# Patient Record
Sex: Male | Born: 1935 | Race: White | Hispanic: No | Marital: Married | Smoking: Former smoker
Health system: Southern US, Community
[De-identification: ages and names within clinical notes are randomized; demographics above are authoritative.]

## PROBLEM LIST (undated history)

## (undated) DIAGNOSIS — Z8612 Personal history of poliomyelitis: Secondary | ICD-10-CM

## (undated) DIAGNOSIS — I4891 Unspecified atrial fibrillation: Secondary | ICD-10-CM

## (undated) DIAGNOSIS — I5032 Chronic diastolic (congestive) heart failure: Secondary | ICD-10-CM

## (undated) DIAGNOSIS — I10 Essential (primary) hypertension: Secondary | ICD-10-CM

## (undated) DIAGNOSIS — I639 Cerebral infarction, unspecified: Secondary | ICD-10-CM

## (undated) DIAGNOSIS — Z8679 Personal history of other diseases of the circulatory system: Secondary | ICD-10-CM

## (undated) DIAGNOSIS — I219 Acute myocardial infarction, unspecified: Secondary | ICD-10-CM

## (undated) DIAGNOSIS — K222 Esophageal obstruction: Secondary | ICD-10-CM

## (undated) DIAGNOSIS — K219 Gastro-esophageal reflux disease without esophagitis: Secondary | ICD-10-CM

## (undated) DIAGNOSIS — G709 Myoneural disorder, unspecified: Secondary | ICD-10-CM

## (undated) DIAGNOSIS — I2510 Atherosclerotic heart disease of native coronary artery without angina pectoris: Secondary | ICD-10-CM

## (undated) DIAGNOSIS — H269 Unspecified cataract: Secondary | ICD-10-CM

## (undated) DIAGNOSIS — R931 Abnormal findings on diagnostic imaging of heart and coronary circulation: Secondary | ICD-10-CM

## (undated) DIAGNOSIS — J449 Chronic obstructive pulmonary disease, unspecified: Secondary | ICD-10-CM

## (undated) DIAGNOSIS — E785 Hyperlipidemia, unspecified: Secondary | ICD-10-CM

## (undated) DIAGNOSIS — K648 Other hemorrhoids: Secondary | ICD-10-CM

## (undated) DIAGNOSIS — M419 Scoliosis, unspecified: Secondary | ICD-10-CM

## (undated) DIAGNOSIS — I499 Cardiac arrhythmia, unspecified: Secondary | ICD-10-CM

## (undated) DIAGNOSIS — G4733 Obstructive sleep apnea (adult) (pediatric): Secondary | ICD-10-CM

## (undated) DIAGNOSIS — D126 Benign neoplasm of colon, unspecified: Secondary | ICD-10-CM

## (undated) DIAGNOSIS — T884XXA Failed or difficult intubation, initial encounter: Secondary | ICD-10-CM

## (undated) DIAGNOSIS — A809 Acute poliomyelitis, unspecified: Secondary | ICD-10-CM

## (undated) DIAGNOSIS — M62838 Other muscle spasm: Secondary | ICD-10-CM

## (undated) DIAGNOSIS — I493 Ventricular premature depolarization: Secondary | ICD-10-CM

## (undated) DIAGNOSIS — I4819 Other persistent atrial fibrillation: Secondary | ICD-10-CM

## (undated) DIAGNOSIS — T4145XA Adverse effect of unspecified anesthetic, initial encounter: Secondary | ICD-10-CM

## (undated) HISTORY — DX: Personal history of poliomyelitis: Z86.12

## (undated) HISTORY — DX: Acute myocardial infarction, unspecified: I21.9

## (undated) HISTORY — DX: Gastro-esophageal reflux disease without esophagitis: K21.9

## (undated) HISTORY — DX: Hyperlipidemia, unspecified: E78.5

## (undated) HISTORY — DX: Other persistent atrial fibrillation: I48.19

## (undated) HISTORY — DX: Ventricular premature depolarization: I49.3

## (undated) HISTORY — DX: Chronic obstructive pulmonary disease, unspecified: J44.9

## (undated) HISTORY — DX: Acute poliomyelitis, unspecified: A80.9

## (undated) HISTORY — PX: UPPER GASTROINTESTINAL ENDOSCOPY: SHX188

## (undated) HISTORY — DX: Atherosclerotic heart disease of native coronary artery without angina pectoris: I25.10

## (undated) HISTORY — DX: Personal history of other diseases of the circulatory system: Z86.79

## (undated) HISTORY — PX: TONSILLECTOMY: SUR1361

## (undated) HISTORY — DX: Cerebral infarction, unspecified: I63.9

## (undated) HISTORY — DX: Benign neoplasm of colon, unspecified: D12.6

## (undated) HISTORY — DX: Obstructive sleep apnea (adult) (pediatric): G47.33

## (undated) HISTORY — DX: Unspecified cataract: H26.9

## (undated) HISTORY — DX: Myoneural disorder, unspecified: G70.9

## (undated) HISTORY — PX: ABLATION: SHX5711

## (undated) HISTORY — PX: ESOPHAGEAL DILATION: SHX303

## (undated) HISTORY — DX: Abnormal findings on diagnostic imaging of heart and coronary circulation: R93.1

## (undated) HISTORY — PX: POLYPECTOMY: SHX149

## (undated) HISTORY — DX: Essential (primary) hypertension: I10

## (undated) HISTORY — DX: Other hemorrhoids: K64.8

## (undated) HISTORY — DX: Scoliosis, unspecified: M41.9

## (undated) HISTORY — PX: CARDIAC CATHETERIZATION: SHX172

## (undated) HISTORY — PX: APPENDECTOMY: SHX54

## (undated) HISTORY — DX: Unspecified atrial fibrillation: I48.91

## (undated) HISTORY — PX: EYE SURGERY: SHX253

## (undated) HISTORY — DX: Esophageal obstruction: K22.2

---
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
## 1994-12-27 DIAGNOSIS — I639 Cerebral infarction, unspecified: Secondary | ICD-10-CM

## 1994-12-27 HISTORY — PX: CORONARY STENT PLACEMENT: SHX1402

## 1994-12-27 HISTORY — DX: Cerebral infarction, unspecified: I63.9

## 1994-12-27 HISTORY — PX: CORONARY ARTERY BYPASS GRAFT: SHX141

## 1998-06-11 ENCOUNTER — Ambulatory Visit: Admission: RE | Admit: 1998-06-11 | Discharge: 1998-06-11 | Payer: Self-pay | Admitting: Cardiology

## 1999-11-04 ENCOUNTER — Ambulatory Visit (HOSPITAL_COMMUNITY): Admission: RE | Admit: 1999-11-04 | Discharge: 1999-11-04 | Payer: Self-pay | Admitting: Cardiology

## 1999-11-05 ENCOUNTER — Encounter: Payer: Self-pay | Admitting: Cardiology

## 2002-10-22 ENCOUNTER — Ambulatory Visit (HOSPITAL_COMMUNITY): Admission: RE | Admit: 2002-10-22 | Discharge: 2002-10-22 | Payer: Self-pay | Admitting: Cardiology

## 2002-10-22 ENCOUNTER — Encounter: Payer: Self-pay | Admitting: Cardiology

## 2002-10-23 ENCOUNTER — Encounter: Payer: Self-pay | Admitting: Cardiology

## 2002-12-27 DIAGNOSIS — D126 Benign neoplasm of colon, unspecified: Secondary | ICD-10-CM

## 2002-12-27 HISTORY — DX: Benign neoplasm of colon, unspecified: D12.6

## 2004-01-06 ENCOUNTER — Ambulatory Visit (HOSPITAL_COMMUNITY): Admission: RE | Admit: 2004-01-06 | Discharge: 2004-01-06 | Payer: Self-pay | Admitting: Cardiology

## 2005-10-18 ENCOUNTER — Ambulatory Visit (HOSPITAL_COMMUNITY): Admission: RE | Admit: 2005-10-18 | Discharge: 2005-10-18 | Payer: Self-pay | Admitting: Cardiology

## 2006-02-28 ENCOUNTER — Ambulatory Visit: Payer: Self-pay | Admitting: Gastroenterology

## 2006-03-21 ENCOUNTER — Encounter (INDEPENDENT_AMBULATORY_CARE_PROVIDER_SITE_OTHER): Payer: Self-pay | Admitting: *Deleted

## 2006-03-21 ENCOUNTER — Ambulatory Visit: Payer: Self-pay | Admitting: Gastroenterology

## 2006-11-21 ENCOUNTER — Ambulatory Visit (HOSPITAL_COMMUNITY): Admission: RE | Admit: 2006-11-21 | Discharge: 2006-11-21 | Payer: Self-pay | Admitting: Cardiology

## 2006-12-27 HISTORY — PX: COLONOSCOPY: SHX174

## 2008-03-01 ENCOUNTER — Inpatient Hospital Stay (HOSPITAL_BASED_OUTPATIENT_CLINIC_OR_DEPARTMENT_OTHER): Admission: RE | Admit: 2008-03-01 | Discharge: 2008-03-01 | Payer: Self-pay | Admitting: Cardiology

## 2008-03-28 ENCOUNTER — Inpatient Hospital Stay (HOSPITAL_COMMUNITY): Admission: RE | Admit: 2008-03-28 | Discharge: 2008-03-29 | Payer: Self-pay | Admitting: Cardiology

## 2008-04-11 ENCOUNTER — Encounter (HOSPITAL_COMMUNITY): Admission: RE | Admit: 2008-04-11 | Discharge: 2008-07-10 | Payer: Self-pay | Admitting: Cardiology

## 2008-07-11 ENCOUNTER — Encounter (HOSPITAL_COMMUNITY): Admission: RE | Admit: 2008-07-11 | Discharge: 2008-08-09 | Payer: Self-pay | Admitting: Cardiology

## 2008-10-31 ENCOUNTER — Ambulatory Visit (HOSPITAL_COMMUNITY): Admission: RE | Admit: 2008-10-31 | Discharge: 2008-10-31 | Payer: Self-pay | Admitting: Cardiology

## 2010-02-24 DIAGNOSIS — R931 Abnormal findings on diagnostic imaging of heart and coronary circulation: Secondary | ICD-10-CM

## 2010-02-24 HISTORY — DX: Abnormal findings on diagnostic imaging of heart and coronary circulation: R93.1

## 2010-08-25 ENCOUNTER — Ambulatory Visit: Payer: Self-pay | Admitting: Cardiology

## 2011-02-26 ENCOUNTER — Ambulatory Visit (INDEPENDENT_AMBULATORY_CARE_PROVIDER_SITE_OTHER): Payer: Medicare Other | Admitting: Cardiology

## 2011-02-26 DIAGNOSIS — I2589 Other forms of chronic ischemic heart disease: Secondary | ICD-10-CM

## 2011-02-26 DIAGNOSIS — I6789 Other cerebrovascular disease: Secondary | ICD-10-CM

## 2011-03-10 ENCOUNTER — Encounter: Payer: Self-pay | Admitting: Gastroenterology

## 2011-03-16 NOTE — Letter (Signed)
Summary: Colonoscopy Letter  [facility]  [address] [place], [place] [address]   Phone: [phone]  Fax: [phone]      March 10, 2011 MRN: [id number]   [name] [address] [place], [place]  [address]   Dear Mr. Schriefer,   According to your medical record, it is time for you to schedule a Colonoscopy. The American Cancer Society recommends this procedure as a method to detect early colon cancer. Patients with a family history of colon cancer, or a personal history of colon polyps or inflammatory bowel disease are at increased risk.  This letter has been generated based on the recommendations made at the time of your procedure. If you feel that in your particular situation this may no longer apply, please contact our office.  Please call our office at [phone] to schedule this appointment or to update your records at your earliest convenience.  Thank you for cooperating with [place] to provide you with the very best care possible.   Sincerely,  Judie Petit T. Russella Dar, M.D.  [facility] [phone]

## 2011-05-11 NOTE — Cardiovascular Report (Signed)
NAMERAGHAV, VERRILLI                 ACCOUNT NO.:  [id number]   MEDICAL RECORD NO.:  [id number]          PATIENT TYPE:  OIB   LOCATION:  2899                         FACILITY:  [facility]   PHYSICIAN:  Colleen Can. Deborah Chalk, M.D.DATE OF BIRTH:  12/06/1936   DATE OF PROCEDURE:  10/31/2008  DATE OF DISCHARGE:  10/31/2008                            CARDIAC CATHETERIZATION   PROCEDURE:  Left heart catheterization with selective coronary  angiography, left ventricular angiography with Angio-Seal with  angiography of saphenous vein graft.   TYPE AND SITE OF ENTRY:  Percutaneous right femoral artery.   CATHETERS:  6-French 4 curved Judkins right and left coronary catheters  and 6-French pigtail ventriculographic catheter.   CONTRAST MATERIAL:  Omnipaque.   MEDICATIONS GIVEN PRIOR TO PROCEDURE:  Valium 10 mg p.o.   MEDICATIONS GIVEN DURING PROCEDURE:  Versed 2 mg IV.   COMMENT:  The patient tolerated the procedure well.   HEMODYNAMIC DATA:  The aortic pressure was 112/50, LV was 117/9-70.  There is no aortic valve gradient noted on pullback.   ANGIOGRAPHIC DATA:  1. The saphenous vein graft to distal right coronary artery was widely      patent.  There are minor irregularities in the saphenous vein      graft, but overall it was relatively smooth and had excellent      distal flow.  2. Right coronary artery was totally occluded.  It had diffuse disease      and segmental severe stenosis.  3. Left main coronary artery is normal.  4. Left circumflex.  The left circumflex had a patent stent.  There      was somewhat scattered diffuse disease throughout the course of the      obtuse marginal, but there was no severe focal stenosis and no      greater than 50% narrowing.  5. Left anterior descending was a moderate-sized vessel that extended      to the apex and slightly to the inferior wall.  There was somewhat      diffuse and severe disease in the proximal one-half with segmental      disease  of 60% nature with 2 areas that were in the 60-80% range.      Septal perforating branches in a diagonal vessel were present at      this location.  The vessel itself was proximally to 2.5 mm in      diameter.  6. Left ventricular angiogram.  The left ventricular angiogram was      performed in the RAO position.  Overall cardiac size and silhouette      were normal.  The global ejection fraction was 60%.  There was mild      inferior basilar hypokinesis.   OVERALL IMPRESSION:  Three-vessel coronary disease with totally occluded  right coronary artery with patent saphenous vein graft, patent stent in  the left circumflex coronary artery, and moderately severe to severe  diffuse disease in the proximal left anterior descending with well-  preserved global left ventricular function.   DISCUSSION:  It is felt that Mr. Swanner  is probably best managed  medically at this point in time.  This is long segmental disease in the  left anterior descending in a relatively small vessel.  While we have  had successful in the left circumflex, it clearly was much more severe  than this disease in the left anterior descending.  We will manage him  medically at this point in time.      Colleen Can. Deborah Chalk, M.D.  Electronically Signed     SNT/MEDQ  D:  10/31/2008  T:  11/01/2008  Job:  [id number]

## 2011-05-11 NOTE — Discharge Summary (Signed)
[name], [name]                 ACCOUNT NO.:  [id number]   MEDICAL RECORD NO.:  [id number]          PATIENT TYPE:  INP   LOCATION:  6524                         FACILITY:  [facility]   PHYSICIAN:  Colleen Can. Deborah Chalk, M.D.DATE OF BIRTH:  1936/05/08   DATE OF ADMISSION:  03/28/2008  DATE OF DISCHARGE:  03/29/2008                               DISCHARGE SUMMARY   DISCHARGE DIAGNOSES:  1. Known ischemic heart disease with stent placement to the left      circumflex with a 2.5 mm x 12 mm PROMUS stent proximally and a 2.5      mm x 15 mm PROMUS stent distally.  2. Longstanding ischemic heart disease with first heart attack in      1988, subsequent angioplasty attempts in 1996 with restenosis.  He      underwent coronary artery bypass grafting per Dr. Andrey Campanile in      December 1996, with his most recent catheterization in March 2009,      demonstrating severe 3-vessel disease; totally occluded right      coronary artery; 90% proximal left circumflex, 60% to 80% proximal      left anterior descending with a totally occluded left internal      mammary graft; totally occluded saphenous vein graft to the obtuse      margin; and a patent saphenous vein graft to the right coronary.  3. Previous stroke following cardiac catheterization in 1996.  4. Remote history of polio.  5. Hyperlipidemia.  6. Gastroesophageal reflux disease.   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  He underwent cardiac catheterization on March 6, with those  results as noted above.  Clinically, he had had some complaints of  fatigue as well as shortness of breath, but no chest pain.   Please see the history and physical for further patient presentation and  profile.   LABORATORY DATA:  PT and PTT were unremarkable.  His chemistries were  normal.  His CBC was normal.   HOSPITAL COURSE:  The patient was  admitted electively on March 28, 2008,  in order to undergo stent placement to left circumflex.  The procedure  was tolerated well without any known complications.  The patient  received 600 mg of Plavix as well as Angiomax.  The overall procedure  was tolerated well.  Postprocedure, he was transferred to 6500.  He did  have a small bump in his cardiac enzymes that evening afterwards.  His  CK total was 249, MB of 13.1, and troponin was 0.18.   The following morning, he was doing well on a.m. rounds and was felt to  be a satisfactory candidate for discharge.  His other laboratory data  was satisfactory as well, except for the CK and troponin.  Overall, he  was felt to be a stable candidate for discharge.   DISCHARGE CONDITION:  Stable.   DISCHARGE DIET:  Low-salt, heart healthy.   DISCHARGE INSTRUCTIONS:  His activity is to be increased as tolerated.  He is to engage in no heavy lifting for the next 1 week.  He is to use  an ice pack if needed to the groin.   DISCHARGE MEDICATIONS:  1. Vytorin 10/40 daily.  2. Baby aspirin daily.  3. Singulair 10 mg a day.  4. Prilosec 20 mg a day.  5. Ambien 10 mg at bedtime as needed.  6. Vitamin D 50,000 units once a week.  7. Topical testosterone daily.  8. Toprol-XL 50 mg a day.  9. He may resume his Z-PAK, Mag-Ox, folic acid, Coenzyme Q10, and      calcium as he was taking before.  10.Plavix 75 mg a day is added as well as a prescription for      nitroglycerin p.r.n.   PLAN:  I will plan on seeing him back in the office in 7-10 days,  certainly sooner if any problems arise in the interim.      Sharlee Blew, N.P.      Colleen Can. Deborah Chalk, M.D.  Electronically Signed    LC/MEDQ  D:  04/01/2008  T:  04/01/2008  Job:  [id number]

## 2011-05-11 NOTE — H&P (Signed)
[name], [name]                 ACCOUNT NO.:  [id number]   MEDICAL RECORD NO.:  [id number]           PATIENT TYPE:   LOCATION:                                 FACILITY:   PHYSICIAN:  Colleen Can. Deborah Chalk, M.D.DATE OF BIRTH:  Nov 18, 1936   DATE OF ADMISSION:  03/28/2008  DATE OF DISCHARGE:                              HISTORY & PHYSICAL   CHIEF COMPLAINT:  None.   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  He underwent cardiac catheterization per Dr. Roger Shelter on March 01, 2008.  At that time, he demonstrated severe three-  vessel coronary disease with a totally occluded right coronary, 90%  proximal left circumflex, 60-80% proximal LAD with a totally occluded  left internal mammary graft, totally occluded saphenous vein graft to  the obtuse marginal, and a patent saphenous vein graft to the right  coronary artery distally.  His cardiac catheterization films have been  reviewed by numerous physicians.  It is felt that stent placement to the  left circumflex could be satisfactory.  Clinically, he has really had no  specific chest pain.  He has had more in the way of shortness of breath  if he climbs steps and has had some extra fatigability.   PAST MEDICAL HISTORY.:  1. Longstanding ischemic heart disease with a first heart attack in      1988.  Subsequent angioplasty attempt in 1996 with subsequent      restenosis.  He underwent coronary artery bypass grafting per Dr.      Andrey Campanile in December of 1996 with LIMA to the LAD, vein graft to the      right coronary, vein graft to the obtuse marginal.  His most recent      catheterization was in March of 2009 with those results as noted      above.  2. 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  3. Polio  in the right upper extremity at age 30.  4. Mild scoliosis.  5. History of Raynaud's.  6. History of mild atrophy of the right chest.  7. Hyperlipidemia.  8. GERD.  9. History of herpes zoster.  10.Appendectomy at age 53.  11.Remote history of spinal tap.  12.Previous colonoscopy with polypectomy in 2008.   ALLERGIES:  MORPHINE WHICH CAUSES ITCHING.   CURRENT MEDICATIONS:  1. Vytorin 10/40 daily.  2. Baby aspirin daily.  3. Singulair 10 mg a day.  4. Prilosec 20 mg a day.  5. Ambien 10 mg at bedtime p.r.n.  6. Vitamin D 50,000 international units once a week.  7. Topical testosterone daily.  8. Z-Bec daily.  9. Magnesium oxide daily.  10.Folic acid daily.  11.Coenzyme Q10 daily.  12.Calcium daily.  13.Toprol XL 50 mg a day.   FAMILY HISTORY:  Father died at 79 with a heart attack.  Mother died  age  80 with a heart attack following bypass surgery.  He has one brother who  is 56 who has had recent heart transplantation in the setting of known  significant ischemic heart disease.   SOCIAL HISTORY:  He is a Teacher, early years/pre at [employer].  He has two  children.  He lives at home with his wife.  He does not smoke or use  alcohol products.   REVIEW OF SYSTEMS:  Review of systems is as noted above and is otherwise  unremarkable.   PHYSICAL EXAMINATION:  GENERAL:  He is in no acute distress.  VITAL SIGNS:  His weight is 143 pounds.  Blood pressure is 124/66  sitting, 126/70 standing.  Heart rate is 64, respirations 18.  He is  afebrile.  SKIN:  Skin is warm and dry.  Color is unremarkable.  LUNGS:  Clear.  HEART:  Shows a regular rhythm.  ABDOMEN:  Soft.  EXTREMITIES:  Without edema.  NEUROLOGIC:  Shows no gross focal deficits.   LABORATORY DATA:  PT and PTT are unremarkable.  His chemistries are  normal.  CBC is normal.   OVERALL IMPRESSION:  1. Severe ischemic heart disease with need for percutaneous coronary      intervention to the left circumflex.  2. Remote  myocardial infarction with remote bypass surgery.  3. Hyperlipidemia.  4. Previous stroke following cardiac catheterization.   PLAN:  Will proceed on with attempts at percutaneous coronary  intervention.  The procedure, risks, and benefits have all been reviewed  with the patient extensively and he is willing to proceed on Thursday,  March 28, 2008.      Sharlee Blew, N.P.      Colleen Can. Deborah Chalk, M.D.  Electronically Signed   LC/MEDQ  D:  03/26/2008  T:  03/26/2008  Job:  [id number]   cc:   Colleen Can. Deborah Chalk, M.D.

## 2011-05-11 NOTE — Cardiovascular Report (Signed)
NAMELERON, STOFFERS                 ACCOUNT NO.:  [id number]   MEDICAL RECORD NO.:  [id number]          PATIENT TYPE:  INP   LOCATION:  6524                         FACILITY:  [facility]   PHYSICIAN:  Colleen Can. Deborah Chalk, M.D.DATE OF BIRTH:  Aug 30, 1936   DATE OF PROCEDURE:  03/28/2008  DATE OF DISCHARGE:                            CARDIAC CATHETERIZATION   PROCEDURE:  Percutaneous coronary intervention with stent placement in  the left circumflex including a 2.5 x 12-mm PROMUS stent proximally and  a 2.5 x 15-mm stent distal to the original stent but contiguous.   Type and site of entry was percutaneous right femoral artery with the  above catheters, 6-French JL-4 guide, Hi-Torque floppy guidewire,  predilatation with a 2.0 x 9-mm Maverick, and then a 2.5 x 12-mm PROMUS  stent proximally and 2.5 mm PROMUS stent distally.   MEDICATIONS GIVEN PRIOR TO PROCEDURE:  Valium 10 mg p.o.   MEDICATIONS GIVEN DURING THE PROCEDURE:  Angiomax, Plavix 600 mg p.o.,  Versed 2 mg IV, intracoronary nitroglycerin a total of 300 mcg.   COMMENT:  The patient tolerated the procedure.   PROCEDURE:  The JL-4 guide provided adequate backup.  The Hi-Torque  floppy guidewire passed into the distal left circumflex without  difficulty.  We predilated with a 9-mm stent in what was felt to be the  proximal most severe portion of stenosis.  A 2.5 x 12-mm PROMUS stent  was placed proximally in an adequate position.  There was what was felt  to be somewhat of a distal bubble of normal tissue that was  approximately 3-4 mm in length before that in the more distal segmental  narrowing.  The stent was placed in satisfactory position, but the  distal narrowing appeared to be unsatisfactory and did not really give  satisfactory outflow to the stent.  We then returned with the 2.0 x 9-mm  Maverick balloon and predilatation of the distal stenosis.  We then came  back and  placed a 2.5 x 15-mm PROMUS contiguous with the  previous stent  and extended distally for another 12-15 mm.  It ended just proximal to  the ostium of the largest of the obtuse marginal branches.  The balloon  was dilated to 8 atmospheres to deliver the stent.  We then pulled the  balloon back a few millimeters and post dilatation of the junction of  the two stents at 11 atmospheres for 15 seconds.  The final angiographic  result was felt to be excellent.  We had excellent distal flow.  Proximally the stent was slightly oversized but satisfactory.  Distally  the stent was prominent but satisfactory.  Nitroglycerin was given after  the procedure and there was excellent flow throughout the left  circumflex.   OVERALL IMPRESSION:  Successful stent placement in the proximal segment  of the left circumflex coronary.      Colleen Can. Deborah Chalk, M.D.  Electronically Signed     SNT/MEDQ  D:  03/28/2008  T:  03/28/2008  Job:  [id number]

## 2011-05-11 NOTE — H&P (Signed)
[name], [name]                 ACCOUNT NO.:  [id number]   MEDICAL RECORD NO.:  [id number]            PATIENT TYPE:   LOCATION:                                 FACILITY:   PHYSICIAN:  Colleen Can. Deborah Chalk, M.D.    DATE OF BIRTH:   DATE OF ADMISSION:  03/01/2008  DATE OF DISCHARGE:                              HISTORY & PHYSICAL   CHIEF COMPLAINT:  None.   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  He has  known ischemic heart disease that dates back to 40 when he had his  first heart attack.  He subsequently had bypass surgery in 1996.  He did  have an angioplasty attempt prior to his surgery, but had restenosis  which subsequently resulted in open heart surgery.  He was felt to have  had a CVA after cardiac catheterization in 1996 as well.   He presents to our office on January 13.  His previous cardiologist had  retired.  He has basically been asymptomatic, and has had very minimal  symptoms.  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  He has had some extra fatigability.  He has really had no  chest pain.  His discomfort with his heart attack was more of a nausea  and sense of lack of well-being and vague arm and neck and shoulder  discomfort.  He was referred for 2-D echocardiogram as well as stress  study.  His 2-D echocardiogram showed EF of 55-60% with mild aortic  valve sclerosis and mild mitral regurgitation.  Stress testing was  performed on January 20.  He exercised for a total of 6-1/2 minutes.  The test was stopped prematurely due to tachycardia.  He was  hypertensive as well.  Clinically he had very mild chest discomfort and  shortness of breath.  His EKG was positive for ischemia.  He had  frequent PAC's and a run of SVT noted.  His perfusion images were  normal.  His LV function was  normal.  He was seen back in follow-up and  in light of the 2-3 mm of ST segment depression and the onset of chest  discomfort with exercise he has felt that it is in his best interest to  proceed on with cardiac catheterization to redefine his coronary  anatomy.  He now presents for this procedure.   PAST MEDICAL HISTORY:  1. Longstanding ischemic heart disease with first heart attack in      1988, subsequent angioplasty attempt in 1996 with subsequent      restenosis.  He underwent coronary artery bypass grafting per Dr.      Particia Lather in December 1996 with LIMA to the LAD, vein graft to      the right coronary, and vein graft to the obtuse marginal.  2. 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  3. Polio in the right upper extremity at age 30.  4. Mild scoliosis.  5. History of Raynaud's.  6. History of mild atrophy of the right chest.   ALLERGIES:  MORPHINE which causes itching.   CURRENT MEDICATIONS:  1. Vytorin 10/40 daily.  2. Baby aspirin daily.  3. Singulair 10 mg a day.  4. Prilosec 20 mg a day.  5. Ambien 10 mg at bedtime.  6. Vitamin D once a week 50,000 units.  7. Topical testosterone daily.  8. Z-Bec daily.  9. Mag-Ox daily.  10.Folic acid daily.  11.Coenzyme Q10 daily.  12.Calcium daily.  13.Toprol XL 50 mg a day.   FAMILY HISTORY:  Father died at 33 with heart attack.  Mother died at  age 57 of heart attack following bypass surgery.  He has one brother who  is 16 who has had recent heart transplant with significant ischemic  heart disease.   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  He lives at home with his wife.  He does not use tobacco or  alcohol.   OTHER SURGICAL HISTORY:  History of herpes zoster, appendectomy at age  71, remote history of spinal tap and previous colonoscopy with  polypectomy  in 2008.   REVIEW OF SYSTEMS:  Is as noted above.  He does have some cold feet.  He  has intermittent spasms of the extremities.  He has had chronic  difficulty sleeping and takes Ambien p.r.n.  Otherwise, Review of  Systems is negative.   PHYSICAL EXAMINATION:  GENERAL:  He is in no acute distress.  VITAL SIGNS:  Weight 136 pounds, blood pressure 122/68, heart rate 80  and regular, respirations 18, afebrile.  SKIN:  Warm and dry.  Color is  unremarkable.  NECK:  Supple without bruits.  LUNGS:  Clear.  HEART:  Shows a regular rhythm.  ABDOMEN:  Soft with no abdominal aortic aneurysm.  EXTREMITIES:  His lower extremity pulses are excellent.  There is no  evidence of edema.  NEUROLOGIC:  He is intact.  He does have some scattered fasciculations  noted on exam but no gross focal deficits.   LABORATORY DATA:  His laboratory data is pending.   OVERALL IMPRESSION:  1. Abnormal stress Cardiolite study.  2. Longstanding ischemic heart disease.  3. Hyperlipidemia.  4. Previous cerebrovascular accident following cardiac      catheterization.  5. History of polio.   PLAN:  Will proceed on with diagnostic cardiac catheterization.  The  risks, procedure, and benefits have been explained, and he is willing to  proceed on Friday, March 01, 2008.      Sharlee Blew, N.P.      Colleen Can. Deborah Chalk, M.D.  Electronically Signed    LC/MEDQ  D:  02/28/2008  T:  02/28/2008  Job:  [id number]

## 2011-05-11 NOTE — Cardiovascular Report (Signed)
NAMEELVAN, [name]                 ACCOUNT NO.:  [id number]   MEDICAL RECORD NO.:  [id number]          PATIENT TYPE:  OIB   LOCATION:  1963                         FACILITY:  [facility]   PHYSICIAN:  Colleen Can. Deborah Chalk, M.D.DATE OF BIRTH:  04-27-1936   DATE OF PROCEDURE:  DATE OF DISCHARGE:  03/01/2008                            CARDIAC CATHETERIZATION   PROCEDURE:  Left heart catheterization with selective coronary  angiography, left ventricular angiography, saphenous vein graft  angiography x2, and angiography of left internal mammary artery.   TYPE AND SITE OF ENTRY:  Percutaneous right femoral artery.   CATHETERS:  A 4-French four curved Judkins left coronary catheter, 4-  [place] 3DRC right coronary catheter, right bypass graft catheter, left  internal mammary graft catheter, a 4-French pigtail ventriculographic  catheter.   CONTRAST MATERIAL:  Omnipaque.   MEDICATIONS:  Given prior procedure:  Valium 10 mg.   MEDICATION GIVEN DURING THE PROCEDURE:  Versed 2 mg IV.   COMMENT:  The patient tolerated procedure well.   ANGIOGRAPHIC DATA:  1. Left main coronary artery is normal.  2. Left anterior descending:  Left anterior descending.  He has      segmental 60% to 80% narrowing in it's proximal one third.  There      does appear to be satisfactory flow.  Distal vessels that appears      to be satisfactory.  3. Circumflex:  Left circumflex has 90% proximal stenosis.  It is a      relatively small vessel of approximately 2-1/2 millimeters in      diameter.  It continues as a relatively large obtuse marginal.  It      would appear to be suitable for percutaneous intervention.   RIGHT CORONARY ARTERY:  The right coronary artery is totally occluded in  the midportion.   Saphenous vein graft to obtuse marginal is occluded.   Saphenous vein graft to the right coronary artery is widely patent.  There is a 30% narrowing in the body of the graft, it is somewhat focal.  The saphenous  vein graft inserts prior to the crux.  There is 30% to 40%  narrowing in the right coronary artery, distal to the crux.   The left internal mammary graft is atretic.   Left ventricular angiogram was performed in the RAO position.  Overall  cardiac size and silhouette are normal.  There is mild anterior basilar  hypokinesis.  The global ejection fraction is estimated to be 55%.   OVERALL IMPRESSION:  1. There is severe three-vessel coronary disease with totally occluded      right coronary artery, 90% proximal left circumflex and 60% to 80%      proximal left anterior descending with totally occluded left      internal mammary graft, totally occluded saphenous vein graft to      the obtuse marginal, and patent saphenous vein graft to the right      coronary artery distally.   DISCUSSION:  It is felt that we probably can do a stent placement on the  left circumflex artery.  It would  appear that the saphenous vein graft  to the distal right coronary artery is satisfactory at this time, with  only mild disease in the graft.  The left anterior descending is only a  2 to 2-1/2-mm vessel, and it is segmental length.  My initial plan is to  try to manage him medically with this stenosis, although it is clearly  is severe enough that it could warrant stent placement, but would be  over a segmental length of the artery.      Colleen Can. Deborah Chalk, M.D.  Electronically Signed     SNT/MEDQ  D:  03/01/2008  T:  03/02/2008  Job:  [id number]

## 2011-05-14 NOTE — Op Note (Signed)
NAMECHRISTOP, [name]                 ACCOUNT NO.:  [id number]   MEDICAL RECORD NO.:  [id number]          PATIENT TYPE:  OUT   LOCATION:  NUC                          FACILITY:  [facility]   PHYSICIAN:  Othelia Pulling, M.D.      DATE OF BIRTH:  1936-09-02   DATE OF PROCEDURE:  10/18/2005  DATE OF DISCHARGE:                                 OPERATIVE REPORT   OPERATION/PROCEDURE:  Stress EKG and perfusion study.   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  Prior to that he had  had an acute myocardial infarction in 1988.  Since his bypass surgery, he  has been free from any clinical symptoms of angina or arrhythmia.  He does  not use tobacco.  He had at one time a very transient TIA postoperatively  but no recurrence in any way since that day.   CURRENT MEDICATIONS:  1.  Lopressor 25 mg twice a day, none for two days.  2.  Nexium.  3.  Singulair.  4.  Aspirin.   PHYSICAL EXAMINATION:  VITAL SIGNS:  Weight 184, blood pressure 128/70,  pulse 79 and regular.  HEENT:  Negative.  NECK:  No neck bruit.  LUNGS:  Clear.  HEART:  Regular in rhythm.  No murmur noted.  ABDOMEN:  Soft.  EXTREMITIES:  Reveal good pulsations.   LABORATORY DATA:  A resting EKG within normal limits.  Minor early U-wave  changes noted.   DESCRIPTION OF PROCEDURE:  He underwent a standard Bruce protocol stress  test at which time his peak pulse was 140.  It was terminated at the end of  six minutes.  He had no chest pain or awareness of any discomfort.  His  blood pressure rose to 190/60. Five minutes after rest it was 150 and seven  minutes after rest it was 145 systolic.  At the time of the injection of  isotope, his peak pulse of 140 was associated with 2 mm ST depression  involving lead II and part of lead III.  Perfusion studies were acquired and  will be made a part of this report.   ASSESSMENT:  Positive ST changes with  stress.  Adequate achievement of  target pulse, somewhat slow recovery of blood pressure, occasional premature  ventricular contractions were noted.  A followup will be made and copy of  report to his primary care physician.   IMPRESSION:  1.  Good status post bypass surgery.  2.  Exercise-induced abnormal ST changes, associated with some increased      premature ventricular contractions which may or may not be of clinical      importance.   RECOMMENDATIONS:  Repeat study on an annual basis.           ______________________________  Othelia Pulling, M.D.     JB/MEDQ  D:  10/18/2005  T:  10/18/2005  Job:  [id number]   cc:   Geoffry Paradise, M.D.  Fax: [phone]

## 2011-05-14 NOTE — H&P (Signed)
NAMESAKAI, WOLFORD                 ACCOUNT NO.:  [id number]   MEDICAL RECORD NO.:  [id number]           PATIENT TYPE:   LOCATION:                                 FACILITY:   PHYSICIAN:  Colleen Can. Deborah Chalk, M.D.DATE OF BIRTH:  12/12/1936   DATE OF ADMISSION:  07/11/2008  DATE OF DISCHARGE:                              HISTORY & PHYSICAL   CHIEF COMPLAINT:  Chest pain and palpitations.   HISTORY OF PRESENT ILLNESS:  [name] is a 75 year old white male who  is referred for repeat cardiac catheterization.  He has had previous  stenting of the left circumflex approximately 6 months ago.  He  presented for his routine followup visit today on October 14, 2008.  At  that time, he noted that overall he was doing okay but certainly thought  that he could be doing better.  He has had intermittent chest  discomfort.  He has also had some mild chest discomfort as well as  palpitations and some degree of fatigue.  He has a known severe stenosis  in the left anterior descending and he now presents for repeat  catheterization with plans for possible intervention.   PAST MEDICAL HISTORY:  1. Longstanding ischemic heart disease with first heart attack in      1988, subsequent angioplasty attempt in 1996 with subsequent      restenosis.  He underwent coronary artery bypass grafting by Dr.      Particia Lather in December 1996 with left internal mammary to the      LAD, vein graft to the right coronary, and the vein graft to the      obtuse marginal.  His last catheterization was in March of this      year, which showed severe 3-vessel disease with a totally occluded      right coronary, 90% proximal left circumflex, 60-80% proximal LAD      with a totally occluded left internal mammary graft, totally      occluded saphenous vein graft to the obtuse marginal, and a patent      saphenous vein graft to the right coronary.  He has had stenting of      the left circumflex in April 2009.  2. 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  3. Polio of the right upper extremity at age 33.  4. Mild scoliosis.  5. History of Raynaud's.  6. History of mild atrophy of the right chest.  7. Hyperlipidemia.  8. GERD.  9. Palpitations.  10.History of herpes zoster.  11.Remote appendectomy at the age of 56.  12.Remote history of spinal tap.  13.Previous colonoscopy with polypectomy in 2008.   Allergies are MORPHINE, which causes itching.   CURRENT MEDICATIONS:  1. Vytorin 10/40 daily.  2. Aspirin 81 mg a day.  3. Singulair 10 mg a day.  4. Ambien 10 mg at bedtime.  5. Vitamin D 50,000 units once a month.  6. Testosterone topical daily.  7. Z-PAK daily.  8. Mag-Ox daily.  9. Folic acid daily.  10.CoenzymeQ 10 daily.  11.Calcium daily.  12.Toprol-XL 100 mg a day.  13.Zantac 300 mg a day.  14.Plavix 75 mg a day.  15.Lactinex daily.   Family history is unchanged.   Social history is unchanged.   Review of systems is as noted above and is otherwise unremarkable.   On exam, he is a pleasant white male.  He is in no acute distress.  His  weight is 134 pounds, blood pressure is 130/70 sitting, 126/76 standing,  heart rate 60, respirations 18, he is afebrile.  Skin is warm and dry.  Color is unremarkable.  Lungs are clear.  Heart shows a regular rhythm.  Abdomen is soft, positive bowel sounds, nontender.  Extremities are  without edema.  Neurologic shows no gross focal deficits.   Pertinent labs are pending.   OVERALL IMPRESSION:  1. Multitude of somatic complaints.  2. Known ischemic heart disease with severe stenosis in the left      anterior descending.  3. Extensive history of coronary artery disease with previous stenting      of left circumflex and remote bypass grafting.  4. Remote stroke.  5. Hyperlipidemia.   PLAN:  We will proceed on with attempts  at repeat cardiac  catheterization with the idea of intervening on the left anterior  descending.  Procedure risks and benefits have been reviewed with both  he and his wife and they are willing to proceed.      Sharlee Blew, N.P.      Colleen Can. Deborah Chalk, M.D.  Electronically Signed    LC/MEDQ  D:  10/14/2008  T:  10/15/2008  Job:  [id number]

## 2011-05-26 ENCOUNTER — Other Ambulatory Visit: Payer: Self-pay | Admitting: *Deleted

## 2011-05-26 MED ORDER — CLOPIDOGREL BISULFATE 75 MG PO TABS: 75.0000 mg | ORAL_TABLET | Freq: Every day | ORAL | Status: DC

## 2011-07-19 ENCOUNTER — Telehealth: Payer: Self-pay | Admitting: Cardiology

## 2011-07-19 ENCOUNTER — Other Ambulatory Visit: Payer: Self-pay | Admitting: *Deleted

## 2011-07-19 DIAGNOSIS — I2510 Atherosclerotic heart disease of native coronary artery without angina pectoris: Secondary | ICD-10-CM

## 2011-07-19 NOTE — Telephone Encounter (Signed)
Please place the orders for his blood work (Lp, Cmet) into [employer].

## 2011-08-19 ENCOUNTER — Encounter: Payer: Self-pay | Admitting: *Deleted

## 2011-08-19 DIAGNOSIS — Z8679 Personal history of other diseases of the circulatory system: Secondary | ICD-10-CM | POA: Insufficient documentation

## 2011-08-19 DIAGNOSIS — M419 Scoliosis, unspecified: Secondary | ICD-10-CM | POA: Insufficient documentation

## 2011-08-19 DIAGNOSIS — Z8612 Personal history of poliomyelitis: Secondary | ICD-10-CM | POA: Insufficient documentation

## 2011-08-19 DIAGNOSIS — I639 Cerebral infarction, unspecified: Secondary | ICD-10-CM | POA: Insufficient documentation

## 2011-08-19 DIAGNOSIS — K219 Gastro-esophageal reflux disease without esophagitis: Secondary | ICD-10-CM | POA: Insufficient documentation

## 2011-08-19 DIAGNOSIS — I493 Ventricular premature depolarization: Secondary | ICD-10-CM | POA: Insufficient documentation

## 2011-08-24 ENCOUNTER — Other Ambulatory Visit: Payer: Self-pay | Admitting: *Deleted

## 2011-08-25 ENCOUNTER — Other Ambulatory Visit: Payer: Self-pay | Admitting: *Deleted

## 2011-08-25 ENCOUNTER — Other Ambulatory Visit (INDEPENDENT_AMBULATORY_CARE_PROVIDER_SITE_OTHER): Payer: Medicare Other | Admitting: *Deleted

## 2011-08-25 DIAGNOSIS — I2510 Atherosclerotic heart disease of native coronary artery without angina pectoris: Secondary | ICD-10-CM

## 2011-08-25 LAB — HEPATIC FUNCTION PANEL
ALT: 32 U/L (ref 0–53)
AST: 32 U/L (ref 0–37)
Albumin: 4.2 g/dL (ref 3.5–5.2)
Alkaline Phosphatase: 61 U/L (ref 39–117)
Bilirubin, Direct: 0.2 mg/dL (ref 0.0–0.3)
Total Bilirubin: 1.2 mg/dL (ref 0.3–1.2)
Total Protein: 7.1 g/dL (ref 6.0–8.3)

## 2011-08-25 LAB — BASIC METABOLIC PANEL
BUN: 21 mg/dL (ref 6–23)
CO2: 28 mEq/L (ref 19–32)
Calcium: 9.5 mg/dL (ref 8.4–10.5)
Chloride: 103 mEq/L (ref 96–112)
Creatinine, Ser: 1.2 mg/dL (ref 0.4–1.5)
GFR: 63.65 mL/min (ref >60.00)
Glucose, Bld: 100 mg/dL — ABNORMAL HIGH (ref 70–99)
Potassium: 4.1 mEq/L (ref 3.5–5.1)
Sodium: 138 mEq/L (ref 135–145)

## 2011-08-25 LAB — LIPID PANEL
Cholesterol: 164 mg/dL (ref 0–200)
HDL: 52.6 mg/dL (ref >39.00)
LDL Cholesterol: 91 mg/dL (ref 0–99)
Total CHOL/HDL Ratio: 3
Triglycerides: 100 mg/dL (ref 0.0–149.0)
VLDL: 20 mg/dL (ref 0.0–40.0)

## 2011-08-25 MED ORDER — RANOLAZINE ER 500 MG PO TB12: 500.0000 mg | ORAL_TABLET | Freq: Two times a day (BID) | ORAL | Status: DC

## 2011-08-25 NOTE — Telephone Encounter (Signed)
escribe medication per fax request  

## 2011-08-26 ENCOUNTER — Other Ambulatory Visit: Payer: Self-pay | Admitting: *Deleted

## 2011-08-26 MED ORDER — NEBIVOLOL HCL 10 MG PO TABS: 10.0000 mg | ORAL_TABLET | Freq: Every day | ORAL | Status: DC

## 2011-08-26 NOTE — Telephone Encounter (Signed)
escribe medication per fax request  

## 2011-08-27 ENCOUNTER — Telehealth: Payer: Self-pay | Admitting: *Deleted

## 2011-08-27 NOTE — Telephone Encounter (Signed)
Returned call re Labs; notified of labs; will fax results to him at [phone]

## 2011-09-01 ENCOUNTER — Encounter: Payer: Self-pay | Admitting: Nurse Practitioner

## 2011-09-01 ENCOUNTER — Ambulatory Visit (INDEPENDENT_AMBULATORY_CARE_PROVIDER_SITE_OTHER): Payer: Medicare Other | Admitting: Nurse Practitioner

## 2011-09-01 VITALS — BP 110/78 | HR 70 | Ht 66.0 in | Wt 130.2 lb

## 2011-09-01 DIAGNOSIS — E785 Hyperlipidemia, unspecified: Secondary | ICD-10-CM

## 2011-09-01 DIAGNOSIS — I2510 Atherosclerotic heart disease of native coronary artery without angina pectoris: Secondary | ICD-10-CM | POA: Insufficient documentation

## 2011-09-01 MED ORDER — RANOLAZINE ER 1000 MG PO TB12: 500.0000 mg | ORAL_TABLET | Freq: Two times a day (BID) | ORAL | Status: DC

## 2011-09-01 NOTE — Patient Instructions (Signed)
Let's increase your Ranexa to 1000mg  twice a day I want you to see Dr. Marca Ancona in about 6 weeks for follow up. Call for any other problems.

## 2011-09-01 NOTE — Progress Notes (Signed)
[name] Date of Birth: Aug 13, 1936   History of Present Illness: [name] is seen today for his 6 month visit. He is seen for Dr. Shirlee Latch. He is a former patient of Dr. Ronnald Nian. He has a long history of CAD with his first MI in 1988, angioplasty with restenosis in 1996, CABG in 1996, and stents to LCX in 2009. He has been managed medically since that time. Last stress test was in 2011. He will have some occasional angina and DOE. He and his wife think it may be gradually progressing since his last visit. He has not had to use any NTG. He says his symptoms are resolved by the time he gets it out of his pocket.  He does feel like his Ranexa helps him. He is on the 500 mg dose. He has had recent labs and these are reviewed again today.   Current Outpatient Prescriptions on File Prior to Visit  Medication Sig Dispense Refill  . aspirin 81 MG tablet Take 81 mg by mouth daily.        . B Complex Vitamins (VITAMIN-B COMPLEX PO) Take by mouth daily.        . Calcium Citrate (CITRACAL PO) Take by mouth 2 (two) times daily.        . Cholecalciferol (VITAMIN D PO) Take by mouth. Taking 50,000 2x month       . clopidogrel (PLAVIX) 75 MG tablet Take 1 tablet (75 mg total) by mouth daily.  30 tablet  6  . lactobacillus acidophilus & bulgar (LACTINEX) chewable tablet Chew 1 tablet by mouth daily.        [name] LORazepam (ATIVAN) 0.5 MG tablet Take 0.5 mg by mouth as needed. Taking QHS      . magnesium oxide (MAG-OX) 400 MG tablet Take 400 mg by mouth daily.        . nebivolol (BYSTOLIC) 10 MG tablet Take 1 tablet (10 mg total) by mouth daily.  30 tablet  3  . ranitidine (ZANTAC) 300 MG tablet Take 300 mg by mouth at bedtime.        . rosuvastatin (CRESTOR) 40 MG tablet Take 40 mg by mouth daily.        . Testosterone Micronized POWD 100 mg by Does not apply route daily.        . traZODone (DESYREL) 50 MG tablet Take 50 mg by mouth at bedtime.        [name] DISCONTD: ranolazine (RANEXA) 500 MG 12 hr tablet Take  1 tablet (500 mg total) by mouth 2 (two) times daily.  60 tablet  5    Allergies  Allergen Reactions  . Morphine And Related     itching    Past Medical History  Diagnosis Date  . Coronary artery disease     First MI in 1988. PCI in 1996 with subsequent CABG in 1996 due to restenosis. S/P stents to LCX in 2009. Noted to have residual disease in the LAD in a diffuse manner and atretic LIMA graft. He is managed medically.   [name] History of Raynaud's syndrome   . 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 GERD (gastroesophageal reflux disease)   . Hyperlipidemia   . PVC (premature ventricular contraction)   . 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 Scoliosis     mild  . Polio   . Abnormal nuclear cardiac imaging test March 2011    Has  positive EKG response, no perfusion defect and normal EF    Past Surgical History  Procedure Date  . Coronary artery bypass graft 1996    with a LIMA to the LAD, SVG to RCA and SVG to OM  . Coronary stent placement 2009     LCX  . Appendectomy     Age 38  . Colonoscopy 2008    History  Smoking status  . Former Smoker  Smokeless tobacco  . Not on file    History  Alcohol Use No    Family History  Problem Relation Age of Onset  . Heart disease Mother   . Heart disease Father   . Other Brother     brother had heart transplant    Review of Systems: The review of systems is positive for occasional palpitations. He has known PVC's. He has had gradual worsening of his chest discomfort and DOE. No sl NTG use.  He tried to join a senior exercise walking class but could not keep up. No symptoms at rest. He has some chronic back and neck pains. All other systems were reviewed and are negative.  Physical Exam: BP 110/78  Pulse 70  Ht 5\' 6"  (1.676 m)  Wt 130 lb 3.2 oz (59.058 kg)  BMI 21.01 kg/m2 Patient is very pleasant and in no acute distress. He is quite thin. Skin is warm and dry. Color is normal.  HEENT is  unremarkable. Normocephalic/atraumatic. PERRL. Sclera are nonicteric. Neck is supple. No masses. No JVD. Lungs are clear. Cardiac exam shows a regular rate and rhythm. No ectopics noted today. Abdomen is soft. Extremities are without edema. Gait and ROM are intact. No gross neurologic deficits noted.  LABORATORY DATA:   Assessment / Plan:

## 2011-09-01 NOTE — Assessment & Plan Note (Signed)
He is tolerating his current regimen. Recheck in 6 months.

## 2011-09-01 NOTE — Assessment & Plan Note (Signed)
His history is extensive. He has been managed medically since the time of his last cath in 2009. His stress test was in 2011 and he did have a positive EKG response but no perfusion abnormality and a normal EF. He is not too enthused about having a repeat stress test at this time. He is agreeable to increasing his Ranexa to 1000 mg BID. He has NTG on hand but is not using.  I will have him follow up in 6 weeks. Dr. Shirlee Latch will be assuming his cardiology care. Patient is agreeable to this plan and will call if any problems develop in the interim. Patient is agreeable to this plan and will call if any problems develop in the interim.

## 2011-09-02 ENCOUNTER — Telehealth: Payer: Self-pay | Admitting: *Deleted

## 2011-09-02 NOTE — Telephone Encounter (Signed)
Labs reported at office visit

## 2011-09-02 NOTE — Telephone Encounter (Signed)
Message copied by Burnell Blanks on Thu Sep 02, 2011 10:22 AM ------      Message from: Rosalio Macadamia      Created: Wed Aug 25, 2011  1:37 PM       Ok to report. Labs are satisfactory.  Stay on same medicines. Recheck BMET/HPF/LIPIDS  in 6 months.

## 2011-09-06 NOTE — Progress Notes (Signed)
Agree with note.  Would get ECG to follow QT interval 1-2 weeks after ranolazine increase.  [name] [employer]

## 2011-09-21 LAB — CK TOTAL AND CKMB (NOT AT ARMC)
CK, MB: 13.1 — ABNORMAL HIGH
Relative Index: 5.3 — ABNORMAL HIGH
Total CK: 249 — ABNORMAL HIGH

## 2011-09-21 LAB — BASIC METABOLIC PANEL
BUN: 10
CO2: 29
Calcium: 8.9
Chloride: 105
Creatinine, Ser: 1.06
GFR calc Af Amer: >60
GFR calc non Af Amer: >60
Glucose, Bld: 91
Potassium: 4.5
Sodium: 138

## 2011-09-21 LAB — CBC
HCT: 43.4
Hemoglobin: 14.6
MCHC: 33.7
MCV: 93.9
Platelets: 187
RBC: 4.62
RDW: 13.2
WBC: 7.2

## 2011-09-21 LAB — TROPONIN I: Troponin I: 0.18 — ABNORMAL HIGH

## 2011-09-21 LAB — MAGNESIUM: Magnesium: 2.3

## 2011-10-07 ENCOUNTER — Ambulatory Visit (INDEPENDENT_AMBULATORY_CARE_PROVIDER_SITE_OTHER): Payer: Medicare Other | Admitting: Cardiology

## 2011-10-07 ENCOUNTER — Encounter: Payer: Self-pay | Admitting: Cardiology

## 2011-10-07 VITALS — BP 112/66 | HR 104 | Ht 66.0 in | Wt 132.0 lb

## 2011-10-07 DIAGNOSIS — E785 Hyperlipidemia, unspecified: Secondary | ICD-10-CM

## 2011-10-07 DIAGNOSIS — I25810 Atherosclerosis of coronary artery bypass graft(s) without angina pectoris: Secondary | ICD-10-CM

## 2011-10-07 DIAGNOSIS — I2510 Atherosclerotic heart disease of native coronary artery without angina pectoris: Secondary | ICD-10-CM

## 2011-10-07 MED ORDER — RANOLAZINE ER 1000 MG PO TB12: 1000.0000 mg | ORAL_TABLET | Freq: Two times a day (BID) | ORAL | Status: DC

## 2011-10-07 NOTE — Patient Instructions (Signed)
Your physician has requested that you have an echocardiogram. Echocardiography is a painless test that uses sound waves to create images of your heart. It provides your doctor with information about the size and shape of your heart and how well your heart's chambers and valves are working. This procedure takes approximately one hour. There are no restrictions for this procedure.  Your physician wants you to follow-up in: 4 months with Dr Shirlee Latch. (February 2013). You will receive a reminder letter in the mail two months in advance. If you don't receive a letter, please call our office to schedule the follow-up appointment.

## 2011-10-09 NOTE — Assessment & Plan Note (Signed)
Goal LDL < 70.  Last LDL was 91 on Crestor 40.  Patient has had a hard time getting his LDL down to goal.  Will continue current statin and encourage dietary changes.

## 2011-10-09 NOTE — Progress Notes (Signed)
PCP: Dr. Jacky Kindle  75 yo with history of CAD s/p CABG presents for cardiology followup.  He has been seen by Dr. Deborah Chalk in the past and is seen by me for the first time today.  His last cath was in 11/09.  The SVG-distal RCA was patent, the CFX system was patent.  His LIMA was atretic and there were serial 60% and 80% stenoses in the native LAD.  He was managed medically.    Patient saw [name] in 9/12 and described increased exertional dyspnea and chest pain.  His ranolazine was increased to 1000 mg bid.  He has noted symptomatic improvement with that change.  He is now able to walk on flat ground without dyspnea.  He can walk for up to 2 miles with no problem.  He does get short of breath when carrying a load more than about 50 yards.  He can climb a flight of steps without problems.  He is not having any chest pain now.    ECG: NSR, normal (QTc 438 msec).   Labs (8/12): K 4.1, creatinine 1.2, LDL 91, HDL 53  PMH: 1. CAD: 1st MI in 1988.  CABG 1996.  PCI to CFX in 2009.  LHC (11/09) SVG-dRCA patent, total occlusion RCA, patent CFX stent, atretic LIMA, serial 60 and 80% proximal LAD stenoses, EF 60% with basal inferior hypokinesis.  Myoview in 2011 with no ischemia or infarction.  2. Raynauds 3. Post-polio syndrome 4. GERD 5. Hyperlipidemia 6. Brainstem stroke with Horner's syndrome.  7. Scoliosis. 8. H/o appendectomy  SH: quit tobacco while in his 64s.  Married.  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   FH: Brother with CAD, heart transplant.   ROS: All systems reviewed and negative except as per HPI.   Current Outpatient Prescriptions  Medication Sig Dispense Refill  . aspirin 81 MG tablet Take 81 mg by mouth daily.        . B Complex Vitamins (VITAMIN-B COMPLEX PO) Take by mouth daily.        . Calcium Citrate (CITRACAL PO) Take by mouth 2 (two) times daily.       . Cholecalciferol (VITAMIN D PO) Take by mouth. Taking 50,000 2x month       . clopidogrel (PLAVIX) 75 MG tablet  Take 1 tablet (75 mg total) by mouth daily.  30 tablet  6  . lactobacillus acidophilus & bulgar (LACTINEX) chewable tablet Chew 1 tablet by mouth daily.        [name] LORazepam (ATIVAN) 0.5 MG tablet Take 0.5 mg by mouth as needed. Taking QHS      . magnesium oxide (MAG-OX) 400 MG tablet Take 400 mg by mouth daily.        . nebivolol (BYSTOLIC) 10 MG tablet Take 1 tablet (10 mg total) by mouth daily.  30 tablet  3  . ranitidine (ZANTAC) 300 MG tablet Take 300 mg by mouth at bedtime.        . ranolazine (RANEXA) 1000 MG SR tablet Take 1 tablet (1,000 mg total) by mouth 2 (two) times daily.  180 tablet  3  . rosuvastatin (CRESTOR) 40 MG tablet Take 40 mg by mouth daily.        . Testosterone (TESTODERM TD) Place 1 application onto the skin daily. 10% Testosterone       . traZODone (DESYREL) 50 MG tablet Take 50 mg by mouth at bedtime.          BP 112/66  Pulse 104  Ht  5\' 6"  (1.676 m)  Wt 132 lb (59.875 kg)  BMI 21.31 kg/m2 General: NAD Neck: No JVD, no thyromegaly or thyroid nodule.  Lungs: Clear to auscultation bilaterally with normal respiratory effort. CV: Nondisplaced PMI.  Heart regular S1/S2, no S3/S4, no murmur.  No peripheral edema.  No carotid bruit.  Normal pedal pulses.  Abdomen: Soft, nontender, no hepatosplenomegaly, no distention.  Skin: Intact without lesions or rashes.  Neurologic: Alert and oriented x 3.  Psych: Normal affect. Extremities: No clubbing or cyanosis.  HEENT: Normal.

## 2011-10-09 NOTE — Assessment & Plan Note (Signed)
On last cath, patient had an atretic LIMA and rather diffuse significant native LAD disease.  This certainly could be a source of angina. Since increasing ranolazine, anginal symptoms have essentially resolved.  QT interval was normal today on ECG.   - Continue ASA 81, Plavix, nebivolol, ranolazine and Crestor.   - No further ischemic evaluation at this time as symptoms are improved.  If he re-develops anginal-type symptoms, will arrange for myoview.   - Echo to evaluate LV systolic function.

## 2011-10-11 ENCOUNTER — Ambulatory Visit (HOSPITAL_COMMUNITY): Payer: Medicare Other | Attending: Cardiology | Admitting: Radiology

## 2011-10-11 DIAGNOSIS — Z87891 Personal history of nicotine dependence: Secondary | ICD-10-CM | POA: Insufficient documentation

## 2011-10-11 DIAGNOSIS — I25810 Atherosclerosis of coronary artery bypass graft(s) without angina pectoris: Secondary | ICD-10-CM

## 2011-10-11 DIAGNOSIS — I7300 Raynaud's syndrome without gangrene: Secondary | ICD-10-CM | POA: Insufficient documentation

## 2011-10-11 DIAGNOSIS — R0609 Other forms of dyspnea: Secondary | ICD-10-CM | POA: Insufficient documentation

## 2011-10-11 DIAGNOSIS — E785 Hyperlipidemia, unspecified: Secondary | ICD-10-CM | POA: Insufficient documentation

## 2011-10-11 DIAGNOSIS — I252 Old myocardial infarction: Secondary | ICD-10-CM | POA: Insufficient documentation

## 2011-10-11 DIAGNOSIS — R0989 Other specified symptoms and signs involving the circulatory and respiratory systems: Secondary | ICD-10-CM | POA: Insufficient documentation

## 2011-10-11 DIAGNOSIS — I2510 Atherosclerotic heart disease of native coronary artery without angina pectoris: Secondary | ICD-10-CM | POA: Insufficient documentation

## 2011-10-14 ENCOUNTER — Encounter: Payer: Self-pay | Admitting: *Deleted

## 2011-10-26 ENCOUNTER — Encounter: Payer: Self-pay | Admitting: Gastroenterology

## 2011-11-15 ENCOUNTER — Ambulatory Visit (INDEPENDENT_AMBULATORY_CARE_PROVIDER_SITE_OTHER): Payer: Medicare Other | Admitting: Gastroenterology

## 2011-11-15 ENCOUNTER — Encounter: Payer: Self-pay | Admitting: Gastroenterology

## 2011-11-15 VITALS — BP 128/80 | HR 80 | Ht 66.0 in | Wt 131.0 lb

## 2011-11-15 DIAGNOSIS — Z86010 Personal history of colonic polyps: Secondary | ICD-10-CM

## 2011-11-15 DIAGNOSIS — R1319 Other dysphagia: Secondary | ICD-10-CM

## 2011-11-15 MED ORDER — PEG-KCL-NACL-NASULF-NA ASC-C 100 G PO SOLR: 1.0000 | Freq: Once | ORAL | Status: DC

## 2011-11-15 NOTE — Patient Instructions (Signed)
You have been scheduled for a Upper Endoscopy/ Colonoscopy. See separate instructions.  Pick up your prep kit from your pharmacy.  You will be contacted by our office prior to your procedure for directions on holding your Plavix.  If you do not hear from our office 1 week prior to your scheduled procedure, please call [phone] to discuss.   cc: Geoffry Paradise, MD

## 2011-11-15 NOTE — Progress Notes (Signed)
OK to hold Plavix for 5 days prior to colonoscopy.   Kinzi Frediani [employer]

## 2011-11-15 NOTE — Progress Notes (Signed)
History of Present Illness: This is a 75 year old male here today with his wife. He relates a 2 year history of intermittent solid food dysphagia occasionally requiring self-induced vomiting. He has GERD that is fairly well controlled on daily ranitidine but he does have breakthrough symptoms every week so. Denies weight loss, abdominal pain, constipation, diarrhea, change in stool caliber, melena, hematochezia, nausea, vomiting, chest pain.  Past Medical History  Diagnosis Date  . Coronary artery disease     First MI in 1988. PCI in 1996 with subsequent CABG in 1996 due to restenosis. S/P stents to LCX in 2009. Noted to have residual disease in the LAD in a diffuse manner and atretic LIMA graft. He is managed medically.   [name] History of Raynaud's syndrome   . 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 GERD (gastroesophageal reflux disease)   . Hyperlipidemia   . PVC (premature ventricular contraction)   . 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 Scoliosis     mild  . Abnormal nuclear cardiac imaging test March 2011    Has positive EKG response, no perfusion defect and normal EF  . Internal hemorrhoids   . Adenomatous colon polyp 12/2002   Past Surgical History  Procedure Date  . Coronary artery bypass graft 1996    with a LIMA to the LAD, SVG to RCA and SVG to OM  . Coronary stent placement 2009     LCX  . Appendectomy     Age 89  . Colonoscopy 2008  . Tonsillectomy     reports that he has quit smoking. He has never used smokeless tobacco. He reports that he does not drink alcohol or use illicit drugs. family history includes Heart disease in his father and mother and Other in his brother.  There is no history of Colon cancer. Allergies  Allergen Reactions  . Morphine And Related     itching    Outpatient Encounter Prescriptions as of 11/15/2011  Medication Sig Dispense Refill  . aspirin 81 MG tablet Take 81 mg by mouth daily.        . B Complex Vitamins  (VITAMIN-B COMPLEX PO) Take by mouth daily.        . Calcium Citrate (CITRACAL PO) Take by mouth 2 (two) times daily.       . Cholecalciferol (VITAMIN D PO) Take by mouth. Taking 50,000 2x month       . clopidogrel (PLAVIX) 75 MG tablet Take 1 tablet (75 mg total) by mouth daily.  30 tablet  6  . lactobacillus acidophilus & bulgar (LACTINEX) chewable tablet Chew 1 tablet by mouth daily.        [name] LORazepam (ATIVAN) 0.5 MG tablet Take 0.5 mg by mouth as needed. Taking QHS      . magnesium oxide (MAG-OX) 400 MG tablet Take 400 mg by mouth daily.        . nebivolol (BYSTOLIC) 10 MG tablet Take 1 tablet (10 mg total) by mouth daily.  30 tablet  3  . ranitidine (ZANTAC) 300 MG tablet Take 300 mg by mouth at bedtime.        . ranolazine (RANEXA) 1000 MG SR tablet Take 1 tablet (1,000 mg total) by mouth 2 (two) times daily.  180 tablet  3  . rosuvastatin (CRESTOR) 40 MG tablet Take 40 mg by mouth daily.        . Testosterone (TESTODERM TD) Place 1 application onto  the skin daily. 10% Testosterone       . traZODone (DESYREL) 50 MG tablet Take 50 mg by mouth at bedtime.        . peg 3350 powder (MOVIPREP) 100 G SOLR Take 1 kit (100 g total) by mouth once.  1 kit  0    Review of Systems: Pertinent positive and negative review of systems were noted in the above HPI section. All other review of systems were otherwise negative.  Physical Exam: General: Well developed , well nourished, no acute distress Head: Normocephalic and atraumatic Eyes:  sclerae anicteric, EOMI Ears: Normal auditory acuity Mouth: No deformity or lesions Neck: Supple, no masses or thyromegaly Lungs: Clear throughout to auscultation Heart: Regular rate and rhythm; no murmurs, rubs or bruits Abdomen: Soft, non tender and non distended. No masses, hepatosplenomegaly or hernias noted. Normal Bowel sounds Rectal: Deferred to colonoscopy Musculoskeletal: Symmetrical with no gross deformities  Skin: No lesions on visible  extremities Pulses:  Normal pulses noted Extremities: No clubbing, cyanosis, edema or deformities noted Neurological: Alert oriented x 4, grossly nonfocal Cervical Nodes:  No significant cervical adenopathy Inguinal Nodes: No significant inguinal adenopathy Psychological:  Alert and cooperative. Normal mood and affect  Assessment and Recommendations:  1. Personal history of adenomatous colon polyps initially diagnosed in 2004. He is due for surveillance colonoscopy. The risks, benefits, and alternatives to a 5-7 day hold Plavix were discussed with the patient and he consents to proceed. Will obtain clearance from Dr. Shirlee Latch. Therisks, benefits, and alternatives to colonoscopy with possible biopsy and possible polypectomy were discussed with the patient and they consent to proceed.   2. Solid food dysphagia and GERD. Continue standard antireflux measures and ranitidine 300 mg daily. Rule out an esophageal stricture. May need to increase ranitidine to twice a day or change to a PPI following endoscopy The risks, benefits, and alternatives to endoscopy with possible biopsy and possible dilation were discussed with the patient and they consent to proceed.

## 2011-11-16 ENCOUNTER — Telehealth: Payer: Self-pay

## 2011-11-16 NOTE — Telephone Encounter (Signed)
Marca Ancona, MD 11/15/2011 9:00 PM Signed  OK to hold Plavix for 5 days prior to colonoscopy.  [name]  Pt informed to come off Plavix 5 days prior to his procedure per Dr. Shirlee Latch. Pt verbalized understanding.

## 2011-12-08 ENCOUNTER — Ambulatory Visit (AMBULATORY_SURGERY_CENTER): Payer: Medicare Other | Admitting: Gastroenterology

## 2011-12-08 ENCOUNTER — Encounter: Payer: Self-pay | Admitting: Gastroenterology

## 2011-12-08 DIAGNOSIS — Z1211 Encounter for screening for malignant neoplasm of colon: Secondary | ICD-10-CM

## 2011-12-08 DIAGNOSIS — K210 Gastro-esophageal reflux disease with esophagitis: Secondary | ICD-10-CM

## 2011-12-08 DIAGNOSIS — Z86010 Personal history of colonic polyps: Secondary | ICD-10-CM

## 2011-12-08 DIAGNOSIS — K222 Esophageal obstruction: Secondary | ICD-10-CM

## 2011-12-08 DIAGNOSIS — K219 Gastro-esophageal reflux disease without esophagitis: Secondary | ICD-10-CM

## 2011-12-08 DIAGNOSIS — R1319 Other dysphagia: Secondary | ICD-10-CM

## 2011-12-08 MED ORDER — SODIUM CHLORIDE 0.9 % IV SOLN: 500.0000 mL | INTRAVENOUS | Status: DC

## 2011-12-08 NOTE — Op Note (Signed)
[facility] [address] [place], [place]  [address]  COLONOSCOPY PROCEDURE REPORT  PATIENT:  [name], [name]  MR#:  [id number] BIRTHDATE:  31-Aug-1936, 75 yrs. old  GENDER:  male ENDOSCOPIST:  Judie Petit T. Russella Dar, MD, [facility]  PROCEDURE DATE:  12/08/2011 PROCEDURE:  Higher-risk screening colonoscopy G0105 ASA CLASS:  Class II INDICATIONS:  1) surveillance and high-risk screening  2) history of pre-cancerous (adenomatous) colon polyps: 2004. MEDICATIONS:   MAC sedation, administered by CRNA, propofol (Diprivan) 100 mg IV DESCRIPTION OF PROCEDURE:   After the risks benefits and alternatives of the procedure were thoroughly explained, informed consent was obtained.  Digital rectal exam was performed and revealed no abnormalities.   The LB 180AL [id number] endoscope was introduced through the anus and advanced to the cecum, which was identified by both the appendix and ileocecal valve, without limitations.  The quality of the prep was excellent, using MoviPrep.  The instrument was then slowly withdrawn as the colon was fully examined. <<PROCEDUREIMAGES>> FINDINGS:  A normal appearing cecum, ileocecal valve, and appendiceal orifice were identified. The ascending, hepatic flexure, transverse, splenic flexure, descending, sigmoid colon, and rectum appeared unremarkable.  Retroflexed views in the rectum revealed internal hemorrhoids, small. The time to cecum =  1.33 minutes. The scope was then withdrawn (time =  8  min) from the patient and the procedure completed.  COMPLICATIONS:  None  ENDOSCOPIC IMPRESSION: 1) Normal colon 2) Internal hemorrhoids  RECOMMENDATIONS: 1) Repeat Colonoscopy in 5 years. 2) Resume Plavix today  Ceirra Belli T. Russella Dar, MD, [facility]  CC:  Geoffry Paradise, MD  n. Rosalie DoctorVenita Lick. Jeral Zick at 12/08/2011 03:31 PM  Burnett Sheng, [phone]

## 2011-12-08 NOTE — Progress Notes (Signed)
Patient did not experience any of the following events: a burn prior to discharge; a fall within the facility; wrong site/side/patient/procedure/implant event; or a hospital transfer or hospital admission upon discharge from the facility. (G8907) Patient did not have preoperative order for IV antibiotic SSI prophylaxis. (G8918)  

## 2011-12-08 NOTE — Patient Instructions (Signed)
Discharge instructions given with verbal understanding. Handouts on a dilatation diet, hemorrhoids and a high fiber diet given. Resume Plavix today. Resume all other medications today.

## 2011-12-09 ENCOUNTER — Telehealth: Payer: Self-pay

## 2011-12-09 NOTE — Telephone Encounter (Signed)

## 2011-12-09 NOTE — Op Note (Signed)
[facility] [address] [place], [place]  [address]  ENDOSCOPY PROCEDURE REPORT PATIENT:  [name], [name]  MR#:  [id number] BIRTHDATE:  22-Jun-1936, 75 yrs. old  GENDER:  male ENDOSCOPIST:  Judie Petit T. Russella Dar, MD, [facility] PROCEDURE DATE:  12/08/2011 PROCEDURE:  EGD with dilatation over guidewire, EGD with biopsy ASA CLASS:  Class II INDICATIONS:  1) dysphagia  2) GERD MEDICATIONS:   There was residual sedation effect present from prior procedure, MAC sedation, administered by CRNA, propofol (Diprivan) 120 mg IV TOPICAL ANESTHETIC:  none DESCRIPTION OF PROCEDURE:   After the risks benefits and alternatives of the procedure were thoroughly explained, informed consent was obtained.  The LB GIF-H180 [id number] endoscope was introduced through the mouth and advanced to the second portion of the duodenum, without limitations.  The instrument was slowly withdrawn as the mucosa was carefully examined. <<PROCEDUREIMAGES>> A stricture was found at the gastroesophageal junction. It was benign appearing and circumfirential. It was 13mm diameter. Multiple biopsies were obtained and sent to pathology.  The stomach was entered and closely examined. The pylorus, antrum, angularis, and lesser curvature were well visualized, including a retroflexed view of the cardia and fundus. The stomach wall was normally distensable. The scope passed easily through the pylorus into the duodenum. The duodenal bulb was normal in appearance, as was the postbulbar duodenum. Dilation was then performed at the gastroesphageal junction: 1) Dilator:  Savary over guidewire  Sizes:  14, 15, 16 mm Resistance:  minimal  Heme:  yes on last dilator  COMPLICATIONS:  None  ENDOSCOPIC IMPRESSION: 1) Stricture at the gastroesophageal junction  RECOMMENDATIONS: 1) anti-reflux regimen 2) await pathology results 3) continue PPI 4) post dilation instructions  [name], MD, [facility]  CC:  Geoffry Paradise,  MD  n. Rosalie DoctorVenita Lick. [name] at 12/08/2011 03:51 PM  Burnett Sheng, [phone]

## 2011-12-14 ENCOUNTER — Encounter: Payer: Self-pay | Admitting: Gastroenterology

## 2011-12-14 ENCOUNTER — Encounter: Payer: Medicare Other | Admitting: Gastroenterology

## 2011-12-22 ENCOUNTER — Other Ambulatory Visit: Payer: Self-pay | Admitting: Cardiology

## 2011-12-22 ENCOUNTER — Other Ambulatory Visit: Payer: Self-pay | Admitting: Nurse Practitioner

## 2012-06-27 ENCOUNTER — Encounter: Payer: Self-pay | Admitting: Cardiology

## 2012-06-27 ENCOUNTER — Ambulatory Visit (INDEPENDENT_AMBULATORY_CARE_PROVIDER_SITE_OTHER): Payer: Medicare Other | Admitting: Cardiology

## 2012-06-27 VITALS — BP 144/68 | HR 66 | Ht 66.0 in | Wt 133.0 lb

## 2012-06-27 DIAGNOSIS — R0989 Other specified symptoms and signs involving the circulatory and respiratory systems: Secondary | ICD-10-CM

## 2012-06-27 DIAGNOSIS — I10 Essential (primary) hypertension: Secondary | ICD-10-CM | POA: Diagnosis not present

## 2012-06-27 DIAGNOSIS — I2510 Atherosclerotic heart disease of native coronary artery without angina pectoris: Secondary | ICD-10-CM

## 2012-06-27 DIAGNOSIS — E785 Hyperlipidemia, unspecified: Secondary | ICD-10-CM

## 2012-06-27 DIAGNOSIS — R0609 Other forms of dyspnea: Secondary | ICD-10-CM | POA: Diagnosis not present

## 2012-06-27 DIAGNOSIS — E291 Testicular hypofunction: Secondary | ICD-10-CM | POA: Diagnosis not present

## 2012-06-27 NOTE — Progress Notes (Signed)
Patient ID: [name], male   DOB: 1936/03/18, 76 y.o.   MRN: [id number] PCP: Dr. Jacky Kindle  76 yo with history of CAD s/p CABG presents for cardiology followup.  His last cath was in 11/09.  The SVG-distal RCA was patent, the CFX system was patent.  His LIMA was atretic and there were serial 60% and 80% stenoses in the native LAD.  He was managed medically.    Patient saw [name] in 9/12 and described increased exertional dyspnea and some chest pain.  His ranolazine was increased to 1000 mg bid.  He noted symptomatic improvement with that change.  He did well until this last weekend.  This weekend, he developed shortness of breath while working in the yard.  This actually lasted most of Saturday and Sunday with dyspnea with minimal exertion.  No cough/wheeze, no fever.  No orthopnea or PND.  Symptoms resolved and he has had no further dyspnea since this weekend.  Today he feels quite well.  No chest pain.     ECG: NSR, normal (QTc normal).   Labs (8/12): K 4.1, creatinine 1.2, LDL 91, HDL 53  PMH: 1. CAD: 1st MI in 1988.  CABG 1996.  PCI to CFX in 2009.  LHC (11/09) SVG-dRCA patent, total occlusion RCA, patent CFX stent, atretic LIMA, serial 60 and 80% proximal LAD stenoses, EF 60% with basal inferior hypokinesis.  Myoview in 2011 with no ischemia or infarction.  Echo (10/12) with EF 55-60%, mild LVH, mild MR.  2. Raynauds 3. Post-polio syndrome 4. GERD with dilation of esophageal stricture in 12/12.  5. Hyperlipidemia 6. Brainstem stroke with Horner's syndrome.  7. Scoliosis. 8. H/o appendectomy  SH: quit tobacco while in his 52s.  Married.  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   FH: Brother with CAD, heart transplant.   ROS: All systems reviewed and negative except as per HPI.   Current Outpatient Prescriptions  Medication Sig Dispense Refill  . aspirin 81 MG tablet Take 81 mg by mouth daily.        . B Complex Vitamins (VITAMIN-B COMPLEX PO) Take by mouth daily.        [name]  BYSTOLIC 10 MG tablet TAKE 1 TABLET EVERY DAY **CALL DALTON MCLEAN FOR FUTURE REFILLS**  30 tablet  6  . Calcium Citrate (CITRACAL PO) Take by mouth 2 (two) times daily.       . Cholecalciferol (VITAMIN D PO) Take by mouth. Taking 50,000 2x month       . clopidogrel (PLAVIX) 75 MG tablet TAKE 1 TABLET EVERY DAY  30 tablet  6  . Coenzyme Q10 (CO Q10) 100 MG TABS Take 1 tablet by mouth daily as needed.      . lactobacillus acidophilus & bulgar (LACTINEX) chewable tablet Chew 2 tablets by mouth daily.       [name] loratadine (CLARITIN) 10 MG tablet Take 10 mg by mouth daily.      [name] LORazepam (ATIVAN) 0.5 MG tablet Take 0.5 mg by mouth as needed. Taking QHS      . magnesium oxide (MAG-OX) 400 MG tablet Take 400 mg by mouth daily.        . ranitidine (ZANTAC) 300 MG tablet TAKE 1 TABLET BY MOUTH EVERY DAY  90 tablet  4  . ranolazine (RANEXA) 1000 MG SR tablet Take 1 tablet (1,000 mg total) by mouth 2 (two) times daily.  180 tablet  3  . rosuvastatin (CRESTOR) 40 MG tablet Take 40 mg by mouth daily.        [name]  Testosterone (TESTODERM TD) Place 1 application onto the skin daily. 10% Testosterone       . traZODone (DESYREL) 50 MG tablet Take 50 mg by mouth at bedtime.          BP 130/68  Pulse 66  Ht 5\' 6"  (1.676 m)  Wt 60.328 kg (133 lb)  BMI 21.47 kg/m2 General: NAD Neck: No JVD, no thyromegaly or thyroid nodule.  Lungs: Clear to auscultation bilaterally with normal respiratory effort. CV: Nondisplaced PMI.  Heart regular S1/S2, no S3/S4, no murmur.  No peripheral edema.  No carotid bruit.  Normal pedal pulses.  Abdomen: Soft, nontender, no hepatosplenomegaly, no distention.  Neurologic: Alert and oriented x 3.  Psych: Normal affect. Extremities: No clubbing or cyanosis.

## 2012-06-27 NOTE — Patient Instructions (Addendum)
Your physician has requested that you have en exercise stress myoview. For further information please visit [email or web address] Please follow instruction sheet, as given.   Your physician wants you to follow-up in: 6 months with Dr Shirlee Latch. (January 2014). You will receive a reminder letter in the mail two months in advance. If you don't receive a letter, please call our office to schedule the follow-up appointment.

## 2012-06-27 NOTE — Assessment & Plan Note (Signed)
On last cath, patient had an atretic LIMA and rather diffuse significant native LAD disease.  This certainly could be a source of angina. Since increasing ranolazine, anginal symptoms resolved.  This weekend, he developed exertional dyspnea, which he says was his anginal equivalent in the past.  The symptoms lasted Saturday and Sunday (with exertion).  He has had no dyspnea for the last 2 days, however.    - Continue ASA 81, Plavix, nebivolol, ranolazine and Crestor.   - Given the new symptoms (that seem to have resolved), I will get an ETT-myoview to risk stratify.

## 2012-06-27 NOTE — Assessment & Plan Note (Signed)
Goal LDL < 70.  I will call PCP's office to get copy of most recent lipids.

## 2012-07-03 ENCOUNTER — Ambulatory Visit (HOSPITAL_COMMUNITY): Payer: Medicare Other | Attending: Cardiology | Admitting: Radiology

## 2012-07-03 VITALS — BP 126/61 | Ht 66.0 in | Wt 132.0 lb

## 2012-07-03 DIAGNOSIS — R0602 Shortness of breath: Secondary | ICD-10-CM

## 2012-07-03 DIAGNOSIS — Z951 Presence of aortocoronary bypass graft: Secondary | ICD-10-CM | POA: Diagnosis not present

## 2012-07-03 DIAGNOSIS — R079 Chest pain, unspecified: Secondary | ICD-10-CM | POA: Insufficient documentation

## 2012-07-03 DIAGNOSIS — Z87891 Personal history of nicotine dependence: Secondary | ICD-10-CM | POA: Diagnosis not present

## 2012-07-03 DIAGNOSIS — I059 Rheumatic mitral valve disease, unspecified: Secondary | ICD-10-CM | POA: Diagnosis not present

## 2012-07-03 DIAGNOSIS — R0609 Other forms of dyspnea: Secondary | ICD-10-CM | POA: Insufficient documentation

## 2012-07-03 DIAGNOSIS — R0789 Other chest pain: Secondary | ICD-10-CM

## 2012-07-03 DIAGNOSIS — I252 Old myocardial infarction: Secondary | ICD-10-CM | POA: Diagnosis not present

## 2012-07-03 DIAGNOSIS — I779 Disorder of arteries and arterioles, unspecified: Secondary | ICD-10-CM | POA: Diagnosis not present

## 2012-07-03 DIAGNOSIS — R0989 Other specified symptoms and signs involving the circulatory and respiratory systems: Secondary | ICD-10-CM | POA: Insufficient documentation

## 2012-07-03 MED ORDER — REGADENOSON 0.4 MG/5ML IV SOLN
0.4000 mg | Freq: Once | INTRAVENOUS | Status: AC
  Administered 2012-07-03: 0.4 mg via INTRAVENOUS

## 2012-07-03 MED ORDER — TECHNETIUM TC 99M TETROFOSMIN IV KIT
11.0000 | PACK | Freq: Once | INTRAVENOUS | Status: AC | PRN
  Administered 2012-07-03: 11 via INTRAVENOUS

## 2012-07-03 MED ORDER — TECHNETIUM TC 99M TETROFOSMIN IV KIT
33.0000 | PACK | Freq: Once | INTRAVENOUS | Status: AC | PRN
  Administered 2012-07-03: 33 via INTRAVENOUS

## 2012-07-03 NOTE — Progress Notes (Addendum)
[facility] SITE 3 NUCLEAR MED [address] [place] [address] [phone]  Cardiology Nuclear Med Study  [name] is a 76 y.o. male     MRN : [id number]     DOB: 03/07/1936  Procedure Date: 07/03/2012  Nuclear Med Background Indication for Stress Test:  Evaluation for Ischemia, Graft Patency and Stent Patency History:  '88 MI,'96 CABG, 3/09 Stents:CFX 11/09 Heart Cath: EF: 60% grafts patent patent stent LAD 60 and 80% TX RX, 10/12 ECHO: ECHO: 55-60% mild LVH, mild MR Cardiac Risk Factors: Carotid Disease and History of Smoking  Symptoms:  Chest Pain and DOE   Nuclear Pre-Procedure Caffeine/Decaff Intake:  None NPO After: 7:00pm   Lungs:  clear O2 Sat: 97% on room air. IV 0.9% NS with Angio Cath:  20g  IV Site: R Hand  IV Started by:  Cathlyn Parsons, RN  Chest Size (in):  34 Cup Size: n/a  Height: 5\' 6"  (1.676 m)  Weight:  132 lb (59.875 kg)  BMI:  Body mass index is 21.31 kg/(m^2). Tech Comments:  Bystolic held x 24 hrs    Nuclear Med Study 1 or 2 day study: 1 day  Stress Test Type:  Treadmill/Lexiscan  Reading MD: Cassell Clement, MD  Order Authorizing Provider:  Fransico Meadow  Resting Radionuclide: Technetium 16m Tetrofosmin  Resting Radionuclide Dose: 11.0 mCi   Stress Radionuclide:  Technetium 67m Tetrofosmin  Stress Radionuclide Dose: 33.0 mCi           Stress Protocol Rest HR: 70 Stress HR: 100  Rest BP: 126/61 Stress BP: 183/58  Exercise Time (min): 7:06 METS: 7.00   Predicted Max HR: 146 bpm % Max HR: 68.49 bpm Rate Pressure Product: 24401   Dose of Adenosine (mg): n/a Dose of Lexiscan: 0.4 mg  Dose of Atropine (mg): n/a Dose of Dobutamine: n/a mcg/kg/min (at max HR)  Stress Test Technologist: Milana Na, EMT-P  Nuclear Technologist:  Domenic Polite, CNMT     Rest Procedure:  Myocardial perfusion imaging was performed at rest 45 minutes following the intravenous administration of Technetium 38m Tetrofosmin. Rest  ECG: NSR - Normal EKG  Stress Procedure:  The patient received IV Lexiscan 0.4 mg over 15-seconds with concurrent low level exercise and then Technetium 49m Tetrofosmin was injected at 30-seconds while the patient continued walking one more minute. There were non specific changes and a rare pvc with Lexiscan. Quantitative spect images were obtained after a 45-minute delay. Stress ECG: No significant change from baseline ECG  QPS Raw Data Images:  Normal; no motion artifact; normal heart/lung ratio. Stress Images:  Normal homogeneous uptake in all areas of the myocardium. Rest Images:  Normal homogeneous uptake in all areas of the myocardium. Subtraction (SDS):  No evidence of ischemia. Transient Ischemic Dilatation (Normal <1.22):  1.04 Lung/Heart Ratio (Normal <0.45):  0.30  Quantitative Gated Spect Images QGS EDV:  68 ml QGS ESV:  23 ml  Impression Exercise Capacity:  Fair exercise capacity. Limited by arthritic hip pain.  Achieved 68% of PMHR. BP Response:  Normal blood pressure response. Clinical Symptoms:  No chest pain. ECG Impression:  No significant ST segment change suggestive of ischemia. Comparison with Prior Nuclear Study: No images to compare  Overall Impression:  Normal lexiscan Myoview Study.  No ischemia.  Submaximal heart rate secondary to bilateral hip pain on treadmill. Switched to [employer].  LV Ejection Fraction: 66%.  LV Wall Motion:  NL LV Function; NL Wall Motion  [employer]  Normal study, please tell patient.   Marca Ancona 07/04/2012

## 2012-07-04 NOTE — Progress Notes (Signed)
Pt.notified

## 2012-07-11 ENCOUNTER — Encounter: Payer: Self-pay | Admitting: Cardiology

## 2012-07-25 ENCOUNTER — Other Ambulatory Visit: Payer: Self-pay | Admitting: Cardiology

## 2012-08-30 ENCOUNTER — Other Ambulatory Visit: Payer: Self-pay | Admitting: Dermatology

## 2012-08-30 DIAGNOSIS — L570 Actinic keratosis: Secondary | ICD-10-CM | POA: Diagnosis not present

## 2012-08-30 DIAGNOSIS — D239 Other benign neoplasm of skin, unspecified: Secondary | ICD-10-CM | POA: Diagnosis not present

## 2012-08-30 DIAGNOSIS — L819 Disorder of pigmentation, unspecified: Secondary | ICD-10-CM | POA: Diagnosis not present

## 2012-08-30 DIAGNOSIS — L821 Other seborrheic keratosis: Secondary | ICD-10-CM | POA: Diagnosis not present

## 2012-08-30 DIAGNOSIS — D485 Neoplasm of uncertain behavior of skin: Secondary | ICD-10-CM | POA: Diagnosis not present

## 2012-10-05 ENCOUNTER — Other Ambulatory Visit: Payer: Self-pay | Admitting: Cardiology

## 2012-12-31 ENCOUNTER — Other Ambulatory Visit: Payer: Self-pay | Admitting: Cardiology

## 2013-01-01 DIAGNOSIS — Z125 Encounter for screening for malignant neoplasm of prostate: Secondary | ICD-10-CM | POA: Diagnosis not present

## 2013-01-01 DIAGNOSIS — I10 Essential (primary) hypertension: Secondary | ICD-10-CM | POA: Diagnosis not present

## 2013-01-01 DIAGNOSIS — E785 Hyperlipidemia, unspecified: Secondary | ICD-10-CM | POA: Diagnosis not present

## 2013-01-01 DIAGNOSIS — M949 Disorder of cartilage, unspecified: Secondary | ICD-10-CM | POA: Diagnosis not present

## 2013-01-08 DIAGNOSIS — I2510 Atherosclerotic heart disease of native coronary artery without angina pectoris: Secondary | ICD-10-CM | POA: Diagnosis not present

## 2013-01-08 DIAGNOSIS — I10 Essential (primary) hypertension: Secondary | ICD-10-CM | POA: Diagnosis not present

## 2013-01-08 DIAGNOSIS — Z0000 Encounter for general adult medical examination without abnormal findings: Secondary | ICD-10-CM | POA: Diagnosis not present

## 2013-01-08 DIAGNOSIS — E291 Testicular hypofunction: Secondary | ICD-10-CM | POA: Diagnosis not present

## 2013-01-08 DIAGNOSIS — E785 Hyperlipidemia, unspecified: Secondary | ICD-10-CM | POA: Diagnosis not present

## 2013-01-08 DIAGNOSIS — Z125 Encounter for screening for malignant neoplasm of prostate: Secondary | ICD-10-CM | POA: Diagnosis not present

## 2013-01-08 DIAGNOSIS — Z1331 Encounter for screening for depression: Secondary | ICD-10-CM | POA: Diagnosis not present

## 2013-01-21 ENCOUNTER — Other Ambulatory Visit: Payer: Self-pay | Admitting: Cardiology

## 2013-02-18 ENCOUNTER — Other Ambulatory Visit: Payer: Self-pay | Admitting: Cardiology

## 2013-02-19 DIAGNOSIS — Z1212 Encounter for screening for malignant neoplasm of rectum: Secondary | ICD-10-CM | POA: Diagnosis not present

## 2013-02-20 DIAGNOSIS — H2510 Age-related nuclear cataract, unspecified eye: Secondary | ICD-10-CM | POA: Diagnosis not present

## 2013-04-04 DIAGNOSIS — L821 Other seborrheic keratosis: Secondary | ICD-10-CM | POA: Diagnosis not present

## 2013-04-04 DIAGNOSIS — L570 Actinic keratosis: Secondary | ICD-10-CM | POA: Diagnosis not present

## 2013-04-28 ENCOUNTER — Other Ambulatory Visit: Payer: Self-pay | Admitting: Cardiology

## 2013-05-15 DIAGNOSIS — I10 Essential (primary) hypertension: Secondary | ICD-10-CM | POA: Diagnosis not present

## 2013-05-15 DIAGNOSIS — S80929A Unspecified superficial injury of unspecified lower leg, initial encounter: Secondary | ICD-10-CM | POA: Diagnosis not present

## 2013-05-15 DIAGNOSIS — I8390 Asymptomatic varicose veins of unspecified lower extremity: Secondary | ICD-10-CM | POA: Diagnosis not present

## 2013-05-15 DIAGNOSIS — S90919A Unspecified superficial injury of unspecified ankle, initial encounter: Secondary | ICD-10-CM | POA: Diagnosis not present

## 2013-05-15 DIAGNOSIS — T148XXA Other injury of unspecified body region, initial encounter: Secondary | ICD-10-CM | POA: Diagnosis not present

## 2013-05-24 DIAGNOSIS — S90919A Unspecified superficial injury of unspecified ankle, initial encounter: Secondary | ICD-10-CM | POA: Diagnosis not present

## 2013-05-24 DIAGNOSIS — I10 Essential (primary) hypertension: Secondary | ICD-10-CM | POA: Diagnosis not present

## 2013-05-24 DIAGNOSIS — I8390 Asymptomatic varicose veins of unspecified lower extremity: Secondary | ICD-10-CM | POA: Diagnosis not present

## 2013-05-24 DIAGNOSIS — I2510 Atherosclerotic heart disease of native coronary artery without angina pectoris: Secondary | ICD-10-CM | POA: Diagnosis not present

## 2013-05-24 DIAGNOSIS — IMO0002 Reserved for concepts with insufficient information to code with codable children: Secondary | ICD-10-CM | POA: Diagnosis not present

## 2013-05-28 DIAGNOSIS — T148XXA Other injury of unspecified body region, initial encounter: Secondary | ICD-10-CM | POA: Diagnosis not present

## 2013-05-28 DIAGNOSIS — S70929A Unspecified superficial injury of unspecified thigh, initial encounter: Secondary | ICD-10-CM | POA: Diagnosis not present

## 2013-05-28 DIAGNOSIS — S70919A Unspecified superficial injury of unspecified hip, initial encounter: Secondary | ICD-10-CM | POA: Diagnosis not present

## 2013-05-28 DIAGNOSIS — I2510 Atherosclerotic heart disease of native coronary artery without angina pectoris: Secondary | ICD-10-CM | POA: Diagnosis not present

## 2013-05-29 DIAGNOSIS — I10 Essential (primary) hypertension: Secondary | ICD-10-CM | POA: Diagnosis not present

## 2013-05-31 DIAGNOSIS — M79609 Pain in unspecified limb: Secondary | ICD-10-CM | POA: Diagnosis not present

## 2013-06-01 DIAGNOSIS — M79609 Pain in unspecified limb: Secondary | ICD-10-CM | POA: Diagnosis not present

## 2013-07-09 DIAGNOSIS — B009 Herpesviral infection, unspecified: Secondary | ICD-10-CM | POA: Diagnosis not present

## 2013-07-12 ENCOUNTER — Emergency Department (HOSPITAL_COMMUNITY): Payer: Medicare Other

## 2013-07-12 ENCOUNTER — Encounter (HOSPITAL_COMMUNITY): Payer: Self-pay | Admitting: Cardiology

## 2013-07-12 ENCOUNTER — Emergency Department (HOSPITAL_COMMUNITY)
Admission: EM | Admit: 2013-07-12 | Discharge: 2013-07-12 | Disposition: A | Discharge status: Home / Self Care | Payer: Medicare Other | Attending: Emergency Medicine | Admitting: Emergency Medicine

## 2013-07-12 DIAGNOSIS — Z8673 Personal history of transient ischemic attack (TIA), and cerebral infarction without residual deficits: Secondary | ICD-10-CM | POA: Diagnosis not present

## 2013-07-12 DIAGNOSIS — M4120 Other idiopathic scoliosis, site unspecified: Secondary | ICD-10-CM | POA: Insufficient documentation

## 2013-07-12 DIAGNOSIS — R112 Nausea with vomiting, unspecified: Secondary | ICD-10-CM | POA: Insufficient documentation

## 2013-07-12 DIAGNOSIS — R61 Generalized hyperhidrosis: Secondary | ICD-10-CM | POA: Insufficient documentation

## 2013-07-12 DIAGNOSIS — Z9861 Coronary angioplasty status: Secondary | ICD-10-CM | POA: Insufficient documentation

## 2013-07-12 DIAGNOSIS — Z951 Presence of aortocoronary bypass graft: Secondary | ICD-10-CM | POA: Diagnosis not present

## 2013-07-12 DIAGNOSIS — Z7902 Long term (current) use of antithrombotics/antiplatelets: Secondary | ICD-10-CM | POA: Insufficient documentation

## 2013-07-12 DIAGNOSIS — B029 Zoster without complications: Secondary | ICD-10-CM | POA: Diagnosis not present

## 2013-07-12 DIAGNOSIS — E785 Hyperlipidemia, unspecified: Secondary | ICD-10-CM | POA: Diagnosis not present

## 2013-07-12 DIAGNOSIS — J3489 Other specified disorders of nose and nasal sinuses: Secondary | ICD-10-CM | POA: Diagnosis not present

## 2013-07-12 DIAGNOSIS — I2510 Atherosclerotic heart disease of native coronary artery without angina pectoris: Secondary | ICD-10-CM | POA: Insufficient documentation

## 2013-07-12 DIAGNOSIS — B0230 Zoster ocular disease, unspecified: Secondary | ICD-10-CM | POA: Diagnosis not present

## 2013-07-12 DIAGNOSIS — Z8739 Personal history of other diseases of the musculoskeletal system and connective tissue: Secondary | ICD-10-CM | POA: Diagnosis not present

## 2013-07-12 DIAGNOSIS — Z860100 Personal history of colon polyps, unspecified: Secondary | ICD-10-CM | POA: Insufficient documentation

## 2013-07-12 DIAGNOSIS — H5789 Other specified disorders of eye and adnexa: Secondary | ICD-10-CM | POA: Diagnosis not present

## 2013-07-12 DIAGNOSIS — Z79899 Other long term (current) drug therapy: Secondary | ICD-10-CM | POA: Insufficient documentation

## 2013-07-12 DIAGNOSIS — K219 Gastro-esophageal reflux disease without esophagitis: Secondary | ICD-10-CM | POA: Diagnosis not present

## 2013-07-12 DIAGNOSIS — R42 Dizziness and giddiness: Secondary | ICD-10-CM | POA: Diagnosis not present

## 2013-07-12 DIAGNOSIS — R209 Unspecified disturbances of skin sensation: Secondary | ICD-10-CM | POA: Diagnosis not present

## 2013-07-12 DIAGNOSIS — Z8619 Personal history of other infectious and parasitic diseases: Secondary | ICD-10-CM | POA: Diagnosis not present

## 2013-07-12 DIAGNOSIS — Z7982 Long term (current) use of aspirin: Secondary | ICD-10-CM | POA: Insufficient documentation

## 2013-07-12 DIAGNOSIS — IMO0002 Reserved for concepts with insufficient information to code with codable children: Secondary | ICD-10-CM | POA: Diagnosis not present

## 2013-07-12 DIAGNOSIS — Z8679 Personal history of other diseases of the circulatory system: Secondary | ICD-10-CM | POA: Diagnosis not present

## 2013-07-12 DIAGNOSIS — H539 Unspecified visual disturbance: Secondary | ICD-10-CM | POA: Insufficient documentation

## 2013-07-12 LAB — BASIC METABOLIC PANEL
BUN: 20 mg/dL (ref 6–23)
CO2: 30 mEq/L (ref 19–32)
Calcium: 10.4 mg/dL (ref 8.4–10.5)
Chloride: 96 mEq/L (ref 96–112)
Creatinine, Ser: 1.26 mg/dL (ref 0.50–1.35)
Glucose, Bld: 94 mg/dL (ref 70–99)

## 2013-07-12 LAB — CBC WITH DIFFERENTIAL/PLATELET
Eosinophils Relative: 0 % (ref 0–5)
HCT: 50.3 % (ref 39.0–52.0)
Hemoglobin: 17.4 g/dL — ABNORMAL HIGH (ref 13.0–17.0)
Lymphocytes Relative: 19 % (ref 12–46)
Lymphs Abs: 1.5 10*3/uL (ref 0.7–4.0)
MCV: 97.3 fL (ref 78.0–100.0)
Monocytes Absolute: 1.4 10*3/uL — ABNORMAL HIGH (ref 0.1–1.0)
Monocytes Relative: 18 % — ABNORMAL HIGH (ref 3–12)
Neutro Abs: 4.8 10*3/uL (ref 1.7–7.7)
RBC: 5.17 MIL/uL (ref 4.22–5.81)
RDW: 13.2 % (ref 11.5–15.5)
WBC: 7.7 10*3/uL (ref 4.0–10.5)

## 2013-07-12 LAB — POCT I-STAT TROPONIN I: Troponin i, poc: 0 ng/mL (ref 0.00–0.08)

## 2013-07-12 MED ORDER — MECLIZINE HCL 25 MG PO TABS
25.0000 mg | ORAL_TABLET | Freq: Once | ORAL | Status: AC
  Administered 2013-07-12: 25 mg via ORAL
  Filled 2013-07-12: qty 1

## 2013-07-12 MED ORDER — MECLIZINE HCL 25 MG PO TABS: 25.0000 mg | ORAL_TABLET | Freq: Four times a day (QID) | ORAL | Status: DC | PRN

## 2013-07-12 NOTE — ED Provider Notes (Signed)
I saw and evaluated the patient, reviewed the resident's note and I agree with the findings and plan. 77 yo man with Hx CAD with prior CABG, prior CVA, and history of paroxysmal atrial fibrillation.  He has recently had herpes zoster of the right forehead, and had root canal procedure yesterday.  Today he was coming back from the mail box and had episode of dizziness and nausea.  He had a feeling of instability, not spinning.  He denies dsyphasia, numbness, paralysis.   Exam shows healing zoster rash on right forehead, no nystagmus, no carotid bruit, mildly irregular pulse, no focal abnormality.  EKG shows atrial fibrillation.  Lab tests, CT of head negative.  Doubt TIA.  Rx Antivert 25 mg qid for dizziness.  Requested that pt be given an appointment for followup of paroxysmal atrial fibrillation at [facility].  Carleene Cooper III, MD 07/12/13 [phone]

## 2013-07-12 NOTE — ED Provider Notes (Signed)
History    CSN: [id number] Arrival date & time 07/12/13  1257  None    Chief Complaint  Patient presents with  . Dizziness    HPI  Pt is a 77 yo Caucasian male with CAD s/p CABG and stents, HL, GERD, and recent herpes zoster infection and root canal procedure who presents with dizziness and nausea that began this morning. Pt reports that this morning around 11 AM he was walking in his driveway when he felt lightheaded and off balance. No sensation of spinning or worsening with head movement . No worsening of symptoms when standing from sitting position.  He then had an episode of NBNB emesis and diaphoresis that resolved with rest. His lightheadedness continued for some time and then resolved.  He developed shingles infection (1st episode 30 yrs ago) last week affecting the right side of his face including right eye. He has been using valtrex and steroid drops in his right eye since Sunday.  He had a root canal procedure 3 weeks on the left side and yesterday a root canal procedure on the the right side. No dental pain or drainage. He used 1 dose of doxycycline yesterday.    No tinnituis, hearing loss, pain inside ear, or vesicles in ear. No facial weakness, dry eyes, loss of taste or drooling. No slurring of speech, facial droop,  paresthesias, weakness, or difficulty ambulating. No fevers, chills, CP, palpitations, SOB,  neck stiffness, or photophobia. Reports mild bandlike headache.           Past Medical History  Diagnosis Date  . Coronary artery disease     First MI in 1988. PCI in 1996 with subsequent CABG in 1996 due to restenosis. S/P stents to LCX in 2009. Noted to have residual disease in the LAD in a diffuse manner and atretic LIMA graft. He is managed medically.   [name] History of Raynaud's syndrome   . 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 GERD (gastroesophageal reflux disease)   . Hyperlipidemia   . PVC (premature ventricular contraction)   . Brainstem stroke 1996     with residual horner syndrome  . Scoliosis     mild  . Abnormal nuclear cardiac imaging test March 2011    Has positive EKG response, no perfusion defect and normal EF  . Internal hemorrhoids   . Adenomatous colon polyp 12/2002   Past Surgical History  Procedure Laterality Date  . Coronary artery bypass graft  1996    with a LIMA to the LAD, SVG to RCA and SVG to OM  . Coronary stent placement  2009     LCX  . Appendectomy      Age 15  . Colonoscopy  2008  . Tonsillectomy     Family History  Problem Relation Age of Onset  . Heart disease Mother   . Heart disease Father   . Other Brother     brother had heart transplant  . Colon cancer Neg Hx    History  Substance Use Topics  . Smoking status: Former Games developer  . Smokeless tobacco: Never Used  . Alcohol Use: No    Review of Systems  Constitutional: Positive for diaphoresis. Negative for fever, chills, fatigue and unexpected weight change.  HENT: Positive for congestion, dental problem and sinus pressure (few days ago). Negative for hearing loss, ear pain, sore throat, facial swelling, rhinorrhea, sneezing, drooling, mouth sores, trouble swallowing, neck stiffness, tinnitus and ear discharge.  Eyes: Positive for pain (right eye from zoster), discharge, redness and visual disturbance.  Respiratory: Negative for cough and shortness of breath.   Cardiovascular: Negative for chest pain, palpitations and leg swelling.  Gastrointestinal: Positive for nausea and vomiting. Negative for abdominal pain, diarrhea, constipation and blood in stool.  Genitourinary: Negative for hematuria and difficulty urinating.  Musculoskeletal: Negative for arthralgias.  Skin: Negative for rash.  Neurological: Positive for dizziness, light-headedness and numbness (chronic numbness of right anterior thigh). Negative for syncope and weakness.  Hematological: Bruises/bleeds easily (on plavix).  Psychiatric/Behavioral: Negative for confusion.     Allergies  Morphine and related  Home Medications   Current Outpatient Rx  Name  Route  Sig  Dispense  Refill  . aspirin 81 MG tablet   Oral   Take 81 mg by mouth daily.           . B Complex Vitamins (VITAMIN-B COMPLEX PO)   Oral   Take by mouth daily.           . clopidogrel (PLAVIX) 75 MG tablet   Oral   Take 75 mg by mouth daily.         . Coenzyme Q10 (CO Q10) 100 MG TABS   Oral   Take 1 tablet by mouth daily as needed.         [name] HYDROcodone-acetaminophen (NORCO/VICODIN) 5-325 MG per tablet   Oral   Take 1 tablet by mouth every 6 (six) hours as needed for pain.         [name] lactobacillus acidophilus & bulgar (LACTINEX) chewable tablet   Oral   Chew 2 tablets by mouth daily.          [name] loratadine (CLARITIN) 10 MG tablet   Oral   Take 10 mg by mouth daily.         [name] LORazepam (ATIVAN) 0.5 MG tablet   Oral   Take 0.5 mg by mouth as needed. Taking QHS         . magnesium oxide (MAG-OX) 400 MG tablet   Oral   Take 400 mg by mouth daily.           . nebivolol (BYSTOLIC) 10 MG tablet   Oral   Take 10 mg by mouth daily.         . prednisoLONE acetate (PRED FORTE) 1 % ophthalmic suspension   Right Eye   Place 1 drop into the right eye 4 (four) times daily.         . ranitidine (ZANTAC) 300 MG tablet   Oral   Take 300 mg by mouth at bedtime.         . ranolazine (RANEXA) 1000 MG SR tablet   Oral   Take 1,000 mg by mouth 2 (two) times daily.         . rosuvastatin (CRESTOR) 40 MG tablet   Oral   Take 40 mg by mouth daily.           . Testosterone (TESTODERM TD)   Transdermal   Place 1 application onto the skin daily. 10% Testosterone          . traZODone (DESYREL) 50 MG tablet   Oral   Take 50 mg by mouth at bedtime.           . valACYclovir (VALTREX) 1000 MG tablet   Oral   Take 1,000 mg by mouth 3 (three) times daily.         . Cholecalciferol (  VITAMIN D PO)   Oral   Take by mouth. Taking 50,000 2x month            BP 131/71  Pulse 84  Temp(Src) 98 F (36.7 C) (Oral)  Resp 18  SpO2 98% Physical Exam  Constitutional: He is oriented to person, place, and time. He appears well-developed and well-nourished. No distress.  HENT:  Head: Normocephalic and atraumatic.  Right Ear: External ear normal.  Nose: Nose normal.  Mouth/Throat: Oropharynx is clear and moist. No oropharyngeal exudate.  Normal facial sensation Healing lesions on right side of face   Eyes: EOM are normal. Right eye exhibits discharge (right eye, non-purulent).  Left sided miosis Ptosis of right eye with watery discharge  Neck: Normal range of motion. Neck supple.  Cardiovascular: Normal rate.   Irregular rhythm   Pulmonary/Chest: Effort normal. No respiratory distress. He has no wheezes. He has no rales. He exhibits no tenderness.  Abdominal: Soft. Bowel sounds are normal. He exhibits no distension. There is no tenderness. There is no rebound and no guarding.  Musculoskeletal: Normal range of motion. He exhibits no edema.  Neurological: He is alert and oriented to person, place, and time. No cranial nerve deficit.  No speech deficits No facial droop Normal sensation   Normal strength  No nystagmus Normal gait, was not lightheaded or nauseous with walking    Skin: Skin is warm and dry. He is not diaphoretic.  Chronic venous changes of right leg   Psychiatric: He has a normal mood and affect. His behavior is normal.    ED Course  Procedures (including critical care time) Labs Reviewed  CBC WITH DIFFERENTIAL - Abnormal; Notable for the following:    Hemoglobin 17.4 (*)    Monocytes Relative 18 (*)    Monocytes Absolute 1.4 (*)    All other components within normal limits  BASIC METABOLIC PANEL - Abnormal; Notable for the following:    Sodium 133 (*)    GFR calc non Af Amer 54 (*)    GFR calc Af Amer 63 (*)    All other components within normal limits  POCT I-STAT TROPONIN I   Ct Head Wo  Contrast  07/12/2013   *RADIOLOGY REPORT*  Clinical Data: Dizziness  CT HEAD WITHOUT CONTRAST  Technique:  Contiguous axial images were obtained from the base of the skull through the vertex without contrast.  Comparison: None  Findings: Mild atrophy.  Mild chronic microvascular ischemic change in the white matter.  Negative for acute infarct, hemorrhage, or mass lesion.  No significant bony abnormality.  IMPRESSION: Atrophy and chronic microvascular ischemia.  No acute abnormality.   Original Report Authenticated By: Janeece Riggers, M.D.   1. Vertigo     MDM  Assessment:  77 yo Caucasian male with CAD s/p CABG and stents, HL, GERD, and recent herpes zoster infection and root canal procedure who presents with dizziness and nausea that began this morning.   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)  vs SE antibiotics/local anesthesia vs TIA/CVA vs Arrhythmia/ACS vs BPPV/labyrinthritis   -Obtain CT Head w/o cont ---> no acute abnormality -Obtain 12-lead EKG ---> normal rate (70), atrial flutter with variable AV block and T wave abnormality  -Obtain troponin levels ---> negative  -Obtain CBC w/diff ---> elevated hemoglobin and monocytes -Obtain BMP ----> mild hyponatremia  -Administer meclizine 25mg  PO once ---> dizziness and nausea improved, able to ambulate without symptoms of vertigo  Disposition: Home ---> Pt's dizziness improved, possibly vertigo due recent shingles infection of face, root canal procedure, and use of antibiotic and pain medication. Per pt he has h/o atrial fibrillation and is on Plavix, today with atrial flutter and AV block. Pt with h/o CAD s/p CABG and stents, will need to follow-up with cardiology for further work-up.     Discharge Instructions:   -To take Meclizine 25mg  x 4 daily for 7 days for dizziness and nausea -[facility] with call you for an appointment -Return to ED if symptoms worsen           Otis Brace, MD 07/12/13 1700

## 2013-07-12 NOTE — ED Provider Notes (Signed)
Date: 07/12/2013  Rate: 98  Rhythm: atrial fibrillation  QRS Axis: normal  Intervals: normal  ST/T Wave abnormalities: normal  Conduction Disutrbances:none  Narrative Interpretation: Abnormal EKG  Old EKG Reviewed: changes noted--was in sinus rhythm on tracing on 10/08/2011.     Carleene Cooper III, MD 07/12/13 [phone]

## 2013-07-12 NOTE — Progress Notes (Signed)
Requested by Dr Ignacia Palma to get a f/u appt for [name]. Did so and called him to let him know about it.

## 2013-07-12 NOTE — Progress Notes (Signed)
4:08 PM  Date: 07/12/2013  Rate: 80  Rhythm: atrial flutter  QRS Axis: normal  Intervals: normal  ST/T Wave abnormalities: Inverte T waves in lateral precordial leads.  Conduction Disutrbances:none  Narrative Interpretation: Abnormal EKG  Old EKG Reviewed: changes noted--similar to EKG earlier today.

## 2013-07-12 NOTE — ED Notes (Addendum)
Pt reports this morning he has felt dizzy and light-headed. States that he has also felt nauseated. Denies any weakness or balance problems or focal weakness noted. No neuro deficits noted at this time. Pt A&Ox4. Reports that he is being treated for active shingles on the right side of his face and is 3 days into treatment. Denies any chest pain or SOB.

## 2013-07-17 DIAGNOSIS — B0052 Herpesviral keratitis: Secondary | ICD-10-CM | POA: Diagnosis not present

## 2013-07-23 ENCOUNTER — Encounter (INDEPENDENT_AMBULATORY_CARE_PROVIDER_SITE_OTHER): Payer: Medicare Other

## 2013-07-23 ENCOUNTER — Ambulatory Visit (INDEPENDENT_AMBULATORY_CARE_PROVIDER_SITE_OTHER): Payer: Medicare Other | Admitting: Nurse Practitioner

## 2013-07-23 ENCOUNTER — Encounter: Payer: Self-pay | Admitting: Nurse Practitioner

## 2013-07-23 VITALS — BP 120/74 | HR 72 | Ht 66.0 in | Wt 128.8 lb

## 2013-07-23 DIAGNOSIS — I4891 Unspecified atrial fibrillation: Secondary | ICD-10-CM

## 2013-07-23 LAB — TSH: TSH: 1.76 u[IU]/mL (ref 0.35–5.50)

## 2013-07-23 MED ORDER — RIVAROXABAN 20 MG PO TABS: 20.0000 mg | ORAL_TABLET | Freq: Every day | ORAL | Status: DC

## 2013-07-23 NOTE — Progress Notes (Addendum)
[name] Date of Birth: 1936-04-16 Medical Record #[id number]  History of Present Illness: [name] is seen back today for a post hospital visit. Seen for Dr. Shirlee Latch. Former patient of Dr. Ronnald Nian. He has known CAD with MI dating back to 1988, PCI in 1996 with subsequent past CABG in 1996 due to restenosis. Stents to the LCX in 2009. [name] Last cath dates back to 2009. Had a SVG to the distal RCA patent and the CFX system was patent. LIMA was atretic and there were serial 60 to 80% stenoses in the native LAD. He was managed medically. He is on chronic Ranexa.   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   Most recently in the ER with dizziness and nausea about 10 days ago. This was associated with vomiting and diaphoresis. He had had shinges on the right side of his face and a past root canal on the left side and right sides. He reported to the ER doctor that he has had PAF and is on Plavix - he was noted to be in atrial fib/flutter with AV block. He was referred back here for a visit.   Comes in today. Here with his wife. Has not been seen here in a year. Had been doing ok. Notes some occasional irregularity of his heart rhythm. Fatigues easily. Has never had chest pain per se. Still working. Been dealing more with the shingles - had involvement in the cornea but no adverse outcome. Not presyncopal. Not really dizzy anymore. He did not feel like this was vertigo as he was told.    Current Outpatient Prescriptions  Medication Sig Dispense Refill  . aspirin 81 MG tablet Take 81 mg by mouth daily.        . B Complex Vitamins (VITAMIN-B COMPLEX PO) Take by mouth daily.        . Cholecalciferol (VITAMIN D PO) Take by mouth. Taking 50,000 2x month       . clopidogrel (PLAVIX) 75 MG tablet Take 75 mg by mouth daily.      . Coenzyme Q10 (CO Q10) 100 MG TABS Take 1 tablet  by mouth daily.       [name] HYDROcodone-acetaminophen (NORCO/VICODIN) 5-325 MG per tablet Take 1 tablet by mouth every 6 (six) hours as needed for pain.      [name] lactobacillus acidophilus & bulgar (LACTINEX) chewable tablet Chew 2 tablets by mouth daily.       [name] loratadine (CLARITIN) 10 MG tablet Take 10 mg by mouth as needed.       [name] LORazepam (ATIVAN) 0.5 MG tablet Take 0.5 mg by mouth as needed. Taking QHS      . magnesium oxide (MAG-OX) 400 MG tablet Take 400 mg by mouth daily.        . nebivolol (BYSTOLIC) 10 MG tablet Take 10 mg by mouth daily.      . prednisoLONE acetate (PRED FORTE) 1 % ophthalmic suspension Place 1 drop into the right eye 4 (four) times daily.      . ranitidine (ZANTAC) 300 MG tablet Take 300 mg by mouth at bedtime.      . ranolazine (RANEXA) 1000 MG SR tablet Take 1,000 mg by mouth 2 (two) times daily.      . rosuvastatin (CRESTOR) 40 MG tablet Take 40 mg by mouth daily.        [name]  Testosterone (TESTODERM TD) Place 1 application onto the skin daily. 10% Testosterone       . traZODone (DESYREL) 50 MG tablet Take 50 mg by mouth at bedtime.        [name] trifluridine (VIROPTIC) 1 % ophthalmic solution Place 1 drop into the right eye 4 (four) times daily.      . valACYclovir (VALTREX) 1000 MG tablet Take 1,000 mg by mouth 3 (three) times daily.       No current facility-administered medications for this visit.    Allergies  Allergen Reactions  . Morphine And Related Other (See Comments)    Irritation    Past Medical History  Diagnosis Date  . Coronary artery disease     First MI in 1988. PCI in 1996 with subsequent CABG in 1996 due to restenosis. S/P stents to LCX in 2009. Noted to have residual disease in the LAD in a diffuse manner and atretic LIMA graft. He is managed medically.   [name] History of Raynaud's syndrome   . 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 GERD (gastroesophageal reflux disease)   . Hyperlipidemia   . PVC (premature ventricular contraction)   .  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 Scoliosis     mild  . Abnormal nuclear cardiac imaging test March 2011    Has positive EKG response, no perfusion defect and normal EF  . Internal hemorrhoids   . Adenomatous colon polyp 12/2002    Past Surgical History  Procedure Laterality Date  . Coronary artery bypass graft  1996    with a LIMA to the LAD, SVG to RCA and SVG to OM  . Coronary stent placement  2009     LCX  . Appendectomy      Age 47  . Colonoscopy  2008  . Tonsillectomy      History  Smoking status  . Former Smoker  Smokeless tobacco  . Never Used    History  Alcohol Use No    Family History  Problem Relation Age of Onset  . Heart disease Mother   . Heart disease Father   . Other Brother     brother had heart transplant  . Colon cancer Neg Hx     Review of Systems: The review of systems is per the HPI.  All other systems were reviewed and are negative.  Physical Exam: BP 120/74  Pulse 72  Ht 5\' 6"  (1.676 m)  Wt 128 lb 12.8 oz (58.423 kg)  BMI 20.8 kg/m2 Patient is very pleasant and in no acute distress. He is quite thin. Skin is warm and dry. Color is normal.  HEENT is unremarkable. Normocephalic/atraumatic. PERRL. Sclera are nonicteric. Neck is supple. No masses. No JVD. Lungs are clear. Cardiac exam shows an irregular rhythm. Rate seems ok. Abdomen is soft. Extremities are without edema. Gait and ROM are intact. No gross neurologic deficits noted.  LABORATORY DATA: TSH pending  EKG pending for today  Lab Results  Component Value Date   WBC 7.7 07/12/2013   HGB 17.4* 07/12/2013   HCT 50.3 07/12/2013   PLT 195 07/12/2013   GLUCOSE 94 07/12/2013   CHOL 164 08/25/2011   TRIG 100.0 08/25/2011   HDL 52.60 08/25/2011   LDLCALC 91 08/25/2011   ALT 32 08/25/2011   AST 32 08/25/2011   NA 133* 07/12/2013   K 4.5 07/12/2013   CL 96 07/12/2013   CREATININE 1.26 07/12/2013  BUN 20 07/12/2013   CO2 30 07/12/2013     Myoview Impression    Exercise Capacity: Fair exercise capacity. Limited by arthritic hip pain. Achieved 68% of PMHR.  BP Response: Normal blood pressure response.  Clinical Symptoms: No chest pain.  ECG Impression: No significant ST segment change suggestive of ischemia.  Comparison with Prior Nuclear Study: No images to compare  Overall Impression: Normal lexiscan Myoview Study. No ischemia. Submaximal heart rate secondary to bilateral hip pain on treadmill. Switched to [employer].  LV Ejection Fraction: 66%. LV Wall Motion: NL LV Function; NL Wall Motion  Thomas Brackbill  Normal study, please tell patient.  Marca Ancona  07/04/2012    Assessment / Plan: 1. Atrial fib/flutter - rate is controlled - I suspect this has been ongoing and is not paroxysmal. We will place a 24 hour Holter today to look at rate control.  I do not remember him having this in the past. He is at added risk for stroke given his past history of stroke, CAD and age. I am starting Xarelto 20 mg a day. Stopping the Plavix. Will leave him on his baby aspirin for now. Will update his echo. See him back in 3 weeks and consider cardioversion.   2. CAD - negative Myoview last year.   Patient is agreeable to this plan and will call if any problems develop in the interim.   Rosalio Macadamia, RN, ANP-C [facility] [address] [place], [place]  [address]   EKG today shows atrial fib with a controlled VR of 80. Nonspecific T wave changes noted.

## 2013-07-23 NOTE — Patient Instructions (Addendum)
Stop your Plavix  We are starting Xarelto 20 mg a day - take with your largest meal of the day  We are checking labs and EKG today  We are updating your echo  I will see you in 3 weeks - we will arrange for a cardioversion at that visit if you are still out of rhythm  We are placing a Holter monitor today  Call the [facility] office at [phone] if you have any questions, problems or concerns.

## 2013-07-24 NOTE — Progress Notes (Signed)
Agree, if this is 1st episode would try cardioversion after 1 month Xarelto.

## 2013-08-01 ENCOUNTER — Telehealth: Payer: Self-pay | Admitting: *Deleted

## 2013-08-01 NOTE — Telephone Encounter (Signed)
Dr Shirlee Latch reviewed monitor --sustained atrial fibrillation. Per Dr Almon Hercules advised to keep appt with [name] 08/13/13 to discuss cardioversion.

## 2013-08-13 ENCOUNTER — Ambulatory Visit (HOSPITAL_COMMUNITY): Payer: Medicare Other | Attending: Cardiology

## 2013-08-13 ENCOUNTER — Encounter: Payer: Self-pay | Admitting: Nurse Practitioner

## 2013-08-13 ENCOUNTER — Ambulatory Visit
Admission: RE | Admit: 2013-08-13 | Discharge: 2013-08-13 | Disposition: A | Discharge status: Home / Self Care | Payer: Medicare Other | Source: Ambulatory Visit | Attending: Nurse Practitioner | Admitting: Nurse Practitioner

## 2013-08-13 ENCOUNTER — Ambulatory Visit (INDEPENDENT_AMBULATORY_CARE_PROVIDER_SITE_OTHER): Payer: Medicare Other | Admitting: Nurse Practitioner

## 2013-08-13 VITALS — BP 130/74 | HR 75 | Ht 66.0 in | Wt 131.8 lb

## 2013-08-13 DIAGNOSIS — I4949 Other premature depolarization: Secondary | ICD-10-CM | POA: Insufficient documentation

## 2013-08-13 DIAGNOSIS — I4891 Unspecified atrial fibrillation: Secondary | ICD-10-CM

## 2013-08-13 DIAGNOSIS — I7300 Raynaud's syndrome without gangrene: Secondary | ICD-10-CM | POA: Diagnosis not present

## 2013-08-13 DIAGNOSIS — Z01818 Encounter for other preprocedural examination: Secondary | ICD-10-CM | POA: Diagnosis not present

## 2013-08-13 LAB — BASIC METABOLIC PANEL
BUN: 15 mg/dL (ref 6–23)
CO2: 27 mEq/L (ref 19–32)
Calcium: 9.9 mg/dL (ref 8.4–10.5)
Chloride: 101 mEq/L (ref 96–112)
Creatinine, Ser: 1.1 mg/dL (ref 0.4–1.5)
GFR: 67.89 mL/min (ref >60.00)
Glucose, Bld: 77 mg/dL (ref 70–99)
Potassium: 4.6 mEq/L (ref 3.5–5.1)
Sodium: 135 mEq/L (ref 135–145)

## 2013-08-13 LAB — APTT: aPTT: 34.2 s — ABNORMAL HIGH (ref 21.7–28.8)

## 2013-08-13 LAB — CBC WITH DIFFERENTIAL/PLATELET
Basophils Absolute: 0 10*3/uL (ref 0.0–0.1)
Basophils Relative: 0.4 % (ref 0.0–3.0)
Eosinophils Absolute: 0.1 10*3/uL (ref 0.0–0.7)
Eosinophils Relative: 1 % (ref 0.0–5.0)
HCT: 42 % (ref 39.0–52.0)
Hemoglobin: 14.2 g/dL (ref 13.0–17.0)
Lymphocytes Relative: 20.3 % (ref 12.0–46.0)
Lymphs Abs: 1.4 10*3/uL (ref 0.7–4.0)
MCHC: 33.7 g/dL (ref 30.0–36.0)
MCV: 101 fl — ABNORMAL HIGH (ref 78.0–100.0)
Monocytes Absolute: 0.9 10*3/uL (ref 0.1–1.0)
Monocytes Relative: 13 % — ABNORMAL HIGH (ref 3.0–12.0)
Neutro Abs: 4.6 10*3/uL (ref 1.4–7.7)
Neutrophils Relative %: 65.3 % (ref 43.0–77.0)
Platelets: 203 10*3/uL (ref 150.0–400.0)
RBC: 4.16 Mil/uL — ABNORMAL LOW (ref 4.22–5.81)
RDW: 14.4 % (ref 11.5–14.6)
WBC: 7.1 10*3/uL (ref 4.5–10.5)

## 2013-08-13 LAB — PROTIME-INR
INR: 1.5 ratio — ABNORMAL HIGH (ref 0.8–1.0)
Prothrombin Time: 15.8 s — ABNORMAL HIGH (ref 10.2–12.4)

## 2013-08-13 NOTE — Progress Notes (Signed)
Echocardiogram performed.  

## 2013-08-13 NOTE — Patient Instructions (Addendum)
We will set up a cardioversion for you for next Wednesday.  You are scheduled for a cardioversion on Wednesday, August 27th at 1:00 pm with Dr. Myrtis Ser or associates. Please go to [facility] 2nd Floor Short Stay at 11:30 am.  Enter through the [facility] Do not have any food or drink after midnight on Tuesday.  You may take your medicines with a sip of water on the day of your procedure.  You will need someone to drive you home following your procedure. You are not allowed to drive for 24 hours post procedure.    We will check labs today.    When you leave here today, go to [facility] for a CXR at [facility] on the first floor  Continue with all your current medicines  I will see you in 3 weeks on a day that Dr. Shirlee Latch is here.   Call the [facility] office at [phone] if you have any questions, problems or concerns.     Electrical Cardioversion Cardioversion is the delivery of a jolt of electricity to change the rhythm of the heart. Sticky patches or metal paddles are placed on the chest to deliver the electricity from a special device. This is done to restore a normal rhythm. A rhythm that is too fast or not regular keeps the heart from pumping well. Compared to medicines used to change an abnormal rhythm, cardioversion is faster and works better. It is also unpleasant and may dislodge blood clots from the heart. WHEN WOULD THIS BE DONE?  In an emergency:  There is low or no blood pressure as a result of the heart rhythm.  Normal rhythm must be restored as fast as possible to protect the brain and heart from further damage.  It may save a life.  For less serious heart rhythms, such as atrial fibrillation or flutter, in which:  The heart is beating too fast or is not regular.  The heart is still able to pump enough blood, but not as well as it should.  Medicine to change the rhythm has not worked.  It is safe to wait in order to allow  time for preparation. LET YOUR CAREGIVER KNOW ABOUT:   Every medicine you are taking. It is very important to do this! Know when to take or stop taking any of them.  Any time in the past that you have felt your heart was not beating normally. RISKS AND COMPLICATIONS   Clots may form in the chambers of the heart if it is beating too fast. These clots may be dislodged during the procedure and travel to other parts of the body.  There is risk of a stroke during and after the procedure if a clot moves. Blood thinners lower this risk.  You may have a special test of your heart (TEE) to make sure there are no clots in your heart. BEFORE THE PROCEDURE   You may have some tests to see how well your heart is working.  You may start taking blood thinners so your blood does not clot as easily.  Other drugs may be given to help your heart work better. PROCEDURE (SCHEDULED)  The procedure is typically done in a hospital by a heart doctor (cardiologist).  You will be told when and where to go.  You may be given some medicine through an intravenous (IV) access to reduce discomfort and make you sleepy before the procedure.  Your whole body may move when  the shock is delivered. Your chest may feel sore.  You may be able to go home after a few hours. Your heart rhythm will be watched to make sure it does not change. HOME CARE INSTRUCTIONS   Only take medicine as directed by your caregiver. Be sure you understand how and when to take your medicine.  Learn how to feel your pulse and check it often.  Limit your activity for 48 hours.  Avoid caffeine and other stimulants as directed. SEEK MEDICAL CARE IF:   You feel like your heart is beating too fast or your pulse is not regular.  You have any questions about your medicines.  You have bleeding that will not stop. SEEK IMMEDIATE MEDICAL CARE IF:   You are dizzy or feel faint.  It is hard to breathe or you feel short of breath.  There  is a change in discomfort in your chest.  Your speech is slurred or you have trouble moving your arm or leg on one side.  You get a muscle cramp.  Your fingers or toes turn cold or blue. MAKE SURE YOU:   Understand these instructions.  Will watch your condition.  Will get help right away if you are not doing well or get worse. Document Released: 12/03/2002 Document Revised: 03/06/2012 Document Reviewed: 04/03/2008 [facility] Patient Information 2014 [place], [place].

## 2013-08-13 NOTE — Progress Notes (Signed)
[name] Date of Birth: [birth year] Medical Record #[id number]  History of Present Illness: [name] is seen back today for a 3 week check. Seen for Dr. Shirlee Latch. Former patient of Dr. Ronnald Nian. He has known CAD with MI dating back to 1988, PCI in 1996 with subsequent past CABG in 1996 due to restenosis. Stents to the LCX in 2009. [name] Last cath dates back to 2009. Had a SVG to the distal RCA patent and the CFX system was patent. LIMA was atretic and there were serial 60 to 80% stenoses in the native LAD. He was managed medically. He is on chronic Ranexa.   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   Seen last month after a visit to the ER.  He was noted to be in atrial fib/flutter with AV block. He was referred back here for a visit. He remained in atrial fib. We stopped his plavix and started Xarelto. A 24 hour Holter showed AF. His rate has been controlled.   Comes back today. Here with his wife, [name]. Doing ok. Has had his echo earlier this morning - those results are pending. Still quite fatigued and tires easily. Still dealing with post herpetic pain as well in the eye. Not dizzy or lightheaded. No passing out. No chest pain.   Current Outpatient Prescriptions  Medication Sig Dispense Refill  . acetaminophen (TYLENOL) 325 MG tablet Take 325 mg by mouth every 4 (four) hours as needed for pain.      [name] aspirin 81 MG tablet Take 81 mg by mouth daily.        . B Complex Vitamins (VITAMIN-B COMPLEX PO) Take by mouth daily.        . Cholecalciferol (VITAMIN D PO) Take by mouth. Taking 50,000 2x month       . HYDROcodone-acetaminophen (NORCO/VICODIN) 5-325 MG per tablet Take 1 tablet by mouth every 6 (six) hours as needed for pain.      [name] lactobacillus acidophilus & bulgar (LACTINEX) chewable tablet Chew 2 tablets by mouth daily.       [name] LORazepam (ATIVAN) 0.5 MG  tablet Take 0.5 mg by mouth as needed. Taking QHS      . magnesium oxide (MAG-OX) 400 MG tablet Take 400 mg by mouth daily.        . nebivolol (BYSTOLIC) 10 MG tablet Take 10 mg by mouth daily.      . ranitidine (ZANTAC) 300 MG tablet Take 300 mg by mouth at bedtime.      . ranolazine (RANEXA) 1000 MG SR tablet Take 1,000 mg by mouth 2 (two) times daily.      . Rivaroxaban (XARELTO) 20 MG TABS Take 1 tablet (20 mg total) by mouth daily.  30 tablet  6  . rosuvastatin (CRESTOR) 40 MG tablet Take 40 mg by mouth daily.        . Testosterone (TESTODERM TD) Place 1 application onto the skin daily. 10% Testosterone       . traZODone (DESYREL) 50 MG tablet Take 50 mg by mouth at bedtime.         No current facility-administered medications for this visit.    Allergies  Allergen Reactions  . Morphine And Related Other (See Comments)    Irritation    Past Medical History  Diagnosis Date  . Coronary artery disease     First MI in  1988. PCI in 1996 with subsequent CABG in 1996 due to restenosis. S/P stents to LCX in 2009. Noted to have residual disease in the LAD in a diffuse manner and atretic LIMA graft. He is managed medically.   [name] History of Raynaud's syndrome   . 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 GERD (gastroesophageal reflux disease)   . Hyperlipidemia   . PVC (premature ventricular contraction)   . 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 Scoliosis     mild  . Abnormal nuclear cardiac imaging test March 2011    Has positive EKG response, no perfusion defect and normal EF  . Internal hemorrhoids   . Adenomatous colon polyp 12/2002    Past Surgical History  Procedure Laterality Date  . Coronary artery bypass graft  1996    with a LIMA to the LAD, SVG to RCA and SVG to OM  . Coronary stent placement  2009     LCX  . Appendectomy      Age 77  . Colonoscopy  2008  . Tonsillectomy      History  Smoking status  . Former Smoker  Smokeless tobacco    . Never Used    History  Alcohol Use No    Family History  Problem Relation Age of Onset  . Heart disease Mother   . Heart disease Father   . Other Brother     brother had heart transplant  . Colon cancer Neg Hx     Review of Systems: The review of systems is per the HPI.  All other systems were reviewed and are negative.  Physical Exam: BP 130/74  Pulse 75  Ht 5\' 6"  (1.676 m)  Wt 131 lb 12.8 oz (59.784 kg)  BMI 21.28 kg/m2 Patient is very pleasant and in no acute distress. Skin is warm and dry. Color is normal.  HEENT is unremarkable. Normocephalic/atraumatic. PERRL. Sclera are nonicteric. Neck is supple. No masses. No JVD. Lungs are clear. Cardiac exam shows a regular rate and rhythm. Abdomen is soft. Extremities are without edema. Gait and ROM are intact. No gross neurologic deficits noted.  LABORATORY DATA: ECHO results pending  EKG today shows atrial fib/flutter - rate is controlled.   Lab Results  Component Value Date   WBC 7.7 07/12/2013   HGB 17.4* 07/12/2013   HCT 50.3 07/12/2013   PLT 195 07/12/2013   GLUCOSE 94 07/12/2013   CHOL 164 08/25/2011   TRIG 100.0 08/25/2011   HDL 52.60 08/25/2011   LDLCALC 91 08/25/2011   ALT 32 08/25/2011   AST 32 08/25/2011   NA 133* 07/12/2013   K 4.5 07/12/2013   CL 96 07/12/2013   CREATININE 1.26 07/12/2013   BUN 20 07/12/2013   CO2 30 07/12/2013   TSH 1.76 07/23/2013    Assessment / Plan:  1. AF - persistent - on anticoagulation - will proceed on with an attempt at cardioversion - this is scheduled for next week with Dr. Myrtis Ser. The procedure has been reviewed in detail and he is willing to proceed. No missed doses of his Xarelto reported. Will check follow up labs today and send for CXR today. I will see him back in 3 weeks on a day that Dr. Shirlee Latch is here in the office.   2. CAD - no chest pain reported. Negative Myoview last year in July.   Patient is agreeable to this plan and will call if any  problems develop in the interim.    Rosalio Macadamia, RN, ANP-C [facility] [address] [place], [place]  [address]

## 2013-08-14 ENCOUNTER — Other Ambulatory Visit: Payer: Medicare Other

## 2013-08-22 ENCOUNTER — Encounter (HOSPITAL_COMMUNITY)
Admission: RE | Disposition: A | Discharge status: Home / Self Care | Payer: Self-pay | Source: Ambulatory Visit | Attending: Cardiology

## 2013-08-22 ENCOUNTER — Ambulatory Visit (HOSPITAL_COMMUNITY): Payer: Medicare Other | Admitting: Certified Registered"

## 2013-08-22 ENCOUNTER — Encounter (HOSPITAL_COMMUNITY): Payer: Self-pay | Admitting: Certified Registered"

## 2013-08-22 ENCOUNTER — Ambulatory Visit (HOSPITAL_COMMUNITY)
Admission: RE | Admit: 2013-08-22 | Discharge: 2013-08-22 | Disposition: A | Discharge status: Home / Self Care | Payer: Medicare Other | Source: Ambulatory Visit | Attending: Cardiology | Admitting: Cardiology

## 2013-08-22 DIAGNOSIS — Z951 Presence of aortocoronary bypass graft: Secondary | ICD-10-CM | POA: Diagnosis not present

## 2013-08-22 DIAGNOSIS — Z9861 Coronary angioplasty status: Secondary | ICD-10-CM | POA: Insufficient documentation

## 2013-08-22 DIAGNOSIS — I4891 Unspecified atrial fibrillation: Secondary | ICD-10-CM | POA: Diagnosis not present

## 2013-08-22 DIAGNOSIS — E785 Hyperlipidemia, unspecified: Secondary | ICD-10-CM | POA: Diagnosis not present

## 2013-08-22 DIAGNOSIS — B91 Sequelae of poliomyelitis: Secondary | ICD-10-CM | POA: Insufficient documentation

## 2013-08-22 DIAGNOSIS — I2510 Atherosclerotic heart disease of native coronary artery without angina pectoris: Secondary | ICD-10-CM | POA: Insufficient documentation

## 2013-08-22 DIAGNOSIS — I252 Old myocardial infarction: Secondary | ICD-10-CM | POA: Insufficient documentation

## 2013-08-22 HISTORY — PX: CARDIOVERSION: SHX1299

## 2013-08-22 SURGERY — CARDIOVERSION
Anesthesia: Monitor Anesthesia Care

## 2013-08-22 MED ORDER — LIDOCAINE HCL (CARDIAC) 20 MG/ML IV SOLN
INTRAVENOUS | Status: DC | PRN
  Administered 2013-08-22: 60 mg via INTRAVENOUS

## 2013-08-22 MED ORDER — PROPOFOL 10 MG/ML IV BOLUS
INTRAVENOUS | Status: DC | PRN
  Administered 2013-08-22: 100 mg via INTRAVENOUS

## 2013-08-22 MED ORDER — DEXTROSE-NACL 5-0.45 % IV SOLN
INTRAVENOUS | Status: DC
  Administered 2013-08-22: 13:00:00 via INTRAVENOUS

## 2013-08-22 NOTE — Preoperative (Signed)
Beta Blockers   Reason not to administer Beta Blockers:Not Applicable 

## 2013-08-22 NOTE — Transfer of Care (Signed)
Immediate Anesthesia Transfer of Care Note  Patient: [name]  Procedure(s) Performed: Procedure(s): CARDIOVERSION (N/A)  Patient Location: Endoscopy Unit  Anesthesia Type:General  Level of Consciousness: lethargic and responds to stimulation  Airway & Oxygen Therapy: Patient Spontanous Breathing and Patient connected to nasal cannula oxygen  Post-op Assessment: Report given to PACU RN and Post -op Vital signs reviewed and stable  Post vital signs: Reviewed and stable  Complications: No apparent anesthesia complications

## 2013-08-22 NOTE — Anesthesia Postprocedure Evaluation (Signed)
  Anesthesia Post-op Note  Patient: [name]  Procedure(s) Performed: Procedure(s): CARDIOVERSION (N/A)  Patient Location: Endoscopy Unit  Anesthesia Type:General  Level of Consciousness: sedated and responds to stimulation  Airway and Oxygen Therapy: Patient Spontanous Breathing and Patient connected to nasal cannula oxygen  Post-op Pain: none  Post-op Assessment: Post-op Vital signs reviewed, Patient's Cardiovascular Status Stable, Respiratory Function Stable, Patent Airway, No signs of Nausea or vomiting and Pain level controlled  Post-op Vital Signs: Reviewed and stable  Complications: No apparent anesthesia complications

## 2013-08-22 NOTE — Anesthesia Preprocedure Evaluation (Addendum)
Anesthesia Evaluation  Patient identified by MRN, date of birth, ID band Patient awake    Reviewed: Allergy & Precautions, H&P , NPO status , Patient's Chart, lab work & pertinent test results, reviewed documented beta blocker date and time   Airway Mallampati: II TM Distance: >3 FB     Dental  (+) Teeth Intact   Pulmonary          Cardiovascular + CAD + dysrhythmias Rhythm:Irregular     Neuro/Psych CVA    GI/Hepatic GERD-  ,  Endo/Other    Renal/GU      Musculoskeletal   Abdominal   Peds  Hematology   Anesthesia Other Findings   Reproductive/Obstetrics                          Anesthesia Physical Anesthesia Plan  ASA: III  Anesthesia Plan: General   Post-op Pain Management:    Induction: Intravenous  Airway Management Planned: Mask  Additional Equipment:   Intra-op Plan:   Post-operative Plan:   Informed Consent: I have reviewed the patients History and Physical, chart, labs and discussed the procedure including the risks, benefits and alternatives for the proposed anesthesia with the patient or authorized representative who has indicated his/her understanding and acceptance.     Plan Discussed with: Anesthesiologist and Surgeon  Anesthesia Plan Comments:         Anesthesia Quick Evaluation

## 2013-08-22 NOTE — H&P (Signed)
History and physical for cardioversion:  August 22, 2013        [name] Date of Birth: 1936/09/07 Medical Record #[id number]   History of Present Illness: [name] is seen back today for a 3 week check. Seen for [name]. Former patient of [name]. He has known CAD with MI dating back to 1988, PCI in 1996 with subsequent past CABG in 1996 due to restenosis. Stents to the LCX in 2009. [name] Last cath dates back to 2009. Had a SVG to the distal RCA patent and the CFX system was patent. LIMA was atretic and there were serial 60 to 80% stenoses in the native LAD. He was managed medically. He is on chronic Ranexa.     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     Seen last month after a visit to the ER.  He was noted to be in atrial fib/flutter with AV block. He was referred back here for a visit. He remained in atrial fib. We stopped his plavix and started Xarelto. A 24 hour Holter showed AF. His rate has been controlled.    Comes back today. Here with his wife, [name]. Doing ok. Has had his echo earlier this morning - those results are pending. Still quite fatigued and tires easily. Still dealing with post herpetic pain as well in the eye. Not dizzy or lightheaded. No passing out. No chest pain.     Current Outpatient Prescriptions   Medication  Sig  Dispense  Refill   .  acetaminophen (TYLENOL) 325 MG tablet  Take 325 mg by mouth every 4 (four) hours as needed for pain.         [name]  aspirin 81 MG tablet  Take 81 mg by mouth daily.           .  B Complex Vitamins (VITAMIN-B COMPLEX PO)  Take by mouth daily.           .  Cholecalciferol (VITAMIN D PO)  Take by mouth. Taking 50,000 2x month          .  HYDROcodone-acetaminophen (NORCO/VICODIN) 5-325 MG per tablet  Take 1 tablet by mouth every 6 (six) hours as needed for pain.         [name]  lactobacillus  acidophilus & bulgar (LACTINEX) chewable tablet  Chew 2 tablets by mouth daily.          [name]  LORazepam (ATIVAN) 0.5 MG tablet  Take 0.5 mg by mouth as needed. Taking QHS         .  magnesium oxide (MAG-OX) 400 MG tablet  Take 400 mg by mouth daily.           .  nebivolol (BYSTOLIC) 10 MG tablet  Take 10 mg by mouth daily.         .  ranitidine (ZANTAC) 300 MG tablet  Take 300 mg by mouth at bedtime.         .  ranolazine (RANEXA) 1000 MG SR tablet  Take 1,000 mg by mouth 2 (two) times daily.         .  Rivaroxaban (XARELTO) 20 MG TABS  Take 1 tablet (20 mg total) by mouth daily.   30 tablet   6   .  rosuvastatin (CRESTOR) 40 MG tablet  Take 40 mg by mouth  daily.           .  Testosterone (TESTODERM TD)  Place 1 application onto the skin daily. 10% Testosterone          .  traZODone (DESYREL) 50 MG tablet  Take 50 mg by mouth at bedtime.               No current facility-administered medications for this visit.         Allergies   Allergen  Reactions   .  Morphine And Related  Other (See Comments)       Irritation         Past Medical History   Diagnosis  Date   .  Coronary artery disease         First MI in 1988. PCI in 1996 with subsequent CABG in 1996 due to restenosis. S/P stents to LCX in 2009. Noted to have residual disease in the LAD in a diffuse manner and atretic LIMA graft. He is managed medically.    [name]  History of Raynaud's syndrome     .  History of post-polio syndrome         as child   .  GERD (gastroesophageal reflux disease)     .  Hyperlipidemia     .  PVC (premature ventricular contraction)     .  Brainstem stroke  1996       with residual horner syndrome   .  Scoliosis         mild   .  Abnormal nuclear cardiac imaging test  March 2011       Has positive EKG response, no perfusion defect and normal EF   .  Internal hemorrhoids     .  Adenomatous colon polyp  12/2002         Past Surgical History   Procedure  Laterality  Date   .  Coronary artery bypass  graft    1996       with a LIMA to the LAD, SVG to RCA and SVG to OM   .  Coronary stent placement    2009        LCX   .  Appendectomy           Age 49   .  Colonoscopy    2008   .  Tonsillectomy             History   Smoking status   .  Former Smoker   Smokeless tobacco   .  Never Used         History   Alcohol Use  No         Family History   Problem  Relation  Age of Onset   .  Heart disease  Mother     .  Heart disease  Father     .  Other  Brother         brother had heart transplant   .  Colon cancer  Neg Hx          Review of Systems: The review of systems is per the HPI.  All other systems were reviewed and are negative.   Physical Exam: BP 130/74  Pulse 75  Ht 5\' 6"  (1.676 m)  Wt 131 lb 12.8 oz (59.784 kg)  BMI 21.28 kg/m2 Patient is very pleasant and in no acute distress. Skin is warm and dry. Color is normal.  HEENT is unremarkable.  Normocephalic/atraumatic. PERRL. Sclera are nonicteric. Neck is supple. No masses. No JVD. Lungs are clear. Cardiac exam shows a regular rate and rhythm. Abdomen is soft. Extremities are without edema. Gait and ROM are intact. No gross neurologic deficits noted.   LABORATORY DATA: ECHO results pending   EKG today shows atrial fib/flutter - rate is controlled.      Lab Results   Component  Value  Date     WBC  7.7  07/12/2013     HGB  17.4*  07/12/2013     HCT  50.3  07/12/2013     PLT  195  07/12/2013     GLUCOSE  94  07/12/2013     CHOL  164  08/25/2011     TRIG  100.0  08/25/2011     HDL  52.60  08/25/2011     LDLCALC  91  08/25/2011     ALT  32  08/25/2011     AST  32  08/25/2011     NA  133*  07/12/2013     K  4.5  07/12/2013     CL  96  07/12/2013     CREATININE  1.26  07/12/2013     BUN  20  07/12/2013     CO2  30  07/12/2013     TSH  1.76  07/23/2013        Assessment / Plan:   1. AF - persistent - on anticoagulation - will proceed on with an attempt at cardioversion - this is scheduled for next week  with [name]. The procedure has been reviewed in detail and he is willing to proceed. No missed doses of his Xarelto reported. Will check follow up labs today and send for CXR today. I will see him back in 3 weeks on a day that [name] is here in the office.    2. CAD - no chest pain reported. Negative Myoview last year in July.    Patient is agreeable to this plan and will call if any problems develop in the interim.    Rosalio Macadamia, RN, ANP-C [facility] [address] [place], [place]  [address]     08/22/2013: As outlined above, patient has been prepared very carefully for cardioversion. He is stable and here today for cardioversion.  Jerral Bonito, MD

## 2013-08-22 NOTE — CV Procedure (Signed)
   Cardioversion:  Consent signed.  Time out done  Anesthesia present. 120mg  IV Propofol given  Anterior-posterior pads............Marland Kitchenbiphasic defibrillator  120 J  ..  NSR   Successful cardioversion.  One shock.   Jerral Bonito, MD

## 2013-08-23 ENCOUNTER — Encounter (HOSPITAL_COMMUNITY): Payer: Self-pay | Admitting: Cardiology

## 2013-08-29 DIAGNOSIS — B0052 Herpesviral keratitis: Secondary | ICD-10-CM | POA: Diagnosis not present

## 2013-09-07 ENCOUNTER — Encounter: Payer: Self-pay | Admitting: Nurse Practitioner

## 2013-09-07 ENCOUNTER — Ambulatory Visit (INDEPENDENT_AMBULATORY_CARE_PROVIDER_SITE_OTHER): Payer: Medicare Other | Admitting: Nurse Practitioner

## 2013-09-07 VITALS — BP 110/80 | HR 64 | Ht 66.0 in | Wt 133.4 lb

## 2013-09-07 DIAGNOSIS — I2510 Atherosclerotic heart disease of native coronary artery without angina pectoris: Secondary | ICD-10-CM

## 2013-09-07 NOTE — Progress Notes (Signed)
[name] Date of Birth: [birth year] Medical Record #[id number]  History of Present Illness: [name] is seen back today for a post cardioversion visit. Seen for Dr. Shirlee Latch. Former patient of Dr. Ronnald Nian. He has known CAD with MI dating back to 1988, PCI in 1996 with subsequent past CABG in 1996 due to restenosis. Stents to the LCX in 2009. [name] Last cath dates back to 2009. Had a SVG to the distal RCA patent and the CFX system was patent. LIMA was atretic and there were serial 60 to 80% stenoses in the native LAD. He was managed medically. He is on chronic Ranexa.   Other issues include GERD, recent herpes zoster with post herpetic neuralgia, post polio syndrome, PVCs, HLD, prior brainstem stroke with residual horner syndrome in 1996 following a cath and polyps.   Seen in July after a visit to the ER. He was noted to be in atrial fib/flutter with AV block. He was referred back here for a visit. He remained in atrial fib. We stopped his plavix and started Xarelto. A 24 hour Holter showed AF. His rate has been controlled. Echo showed normal EF with mild biatrial enlargement.   Seen a month ago and remained in atrial fib - cardioversion was arranged after sufficient period of anticoagulation.   Comes back today. Here with his wife. Doing ok. Feels much better. Notes more energy. "Not as puny" per his wife. No chest pain. Tolerating his medicines. Now on some Lyrica for his post herpetic neuralgia.    Current Outpatient Prescriptions  Medication Sig Dispense Refill  . acetaminophen (TYLENOL) 325 MG tablet Take 325 mg by mouth every 4 (four) hours as needed for pain.      [name] aspirin 81 MG tablet Take 81 mg by mouth daily.        . B Complex Vitamins (VITAMIN-B COMPLEX PO) Take by mouth daily.        . Cholecalciferol (VITAMIN D PO) Take by mouth. Taking 50,000 2x weeks      . HYDROcodone-acetaminophen (NORCO/VICODIN) 5-325 MG per tablet Take 1 tablet by mouth every 6 (six) hours as needed for  pain.      [name] lactobacillus acidophilus & bulgar (LACTINEX) chewable tablet Chew 2 tablets by mouth daily.       [name] LORazepam (ATIVAN) 0.5 MG tablet Take 0.5 mg by mouth as needed. Taking QHS      . LYRICA 50 MG capsule Take 50 mg by mouth 2 (two) times daily.       . magnesium oxide (MAG-OX) 400 MG tablet Take 400 mg by mouth daily.        . nebivolol (BYSTOLIC) 10 MG tablet Take 10 mg by mouth daily.      . PrednisoLONE Acetate (PRED FORTE OP) Apply 0.015 % to eye 3 (three) times daily.      . ranitidine (ZANTAC) 300 MG tablet Take 300 mg by mouth at bedtime.      . ranolazine (RANEXA) 1000 MG SR tablet Take 1,000 mg by mouth 2 (two) times daily.      . Rivaroxaban (XARELTO) 20 MG TABS Take 1 tablet (20 mg total) by mouth daily.  30 tablet  6  . rosuvastatin (CRESTOR) 40 MG tablet Take 40 mg by mouth daily.        . Testosterone (TESTODERM TD) Place 1 application onto the skin daily. 10% Testosterone       . traZODone (DESYREL) 50 MG tablet Take 50 mg by  mouth at bedtime.         No current facility-administered medications for this visit.    Allergies  Allergen Reactions  . Morphine And Related Other (See Comments)    Irritation    Past Medical History  Diagnosis Date  . Coronary artery disease     First MI in 1988. PCI in 1996 with subsequent CABG in 1996 due to restenosis. S/P stents to LCX in 2009. Noted to have residual disease in the LAD in a diffuse manner and atretic LIMA graft. He is managed medically.   [name] History of Raynaud's syndrome   . 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 GERD (gastroesophageal reflux disease)   . Hyperlipidemia   . PVC (premature ventricular contraction)   . 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 Scoliosis     mild  . Abnormal nuclear cardiac imaging test March 2011    Has positive EKG response, no perfusion defect and normal EF  . Internal hemorrhoids   . Adenomatous colon polyp 12/2002    Past Surgical History    Procedure Laterality Date  . Coronary artery bypass graft  1996    with a LIMA to the LAD, SVG to RCA and SVG to OM  . Coronary stent placement  2009     LCX  . Appendectomy      Age 45  . Colonoscopy  2008  . Tonsillectomy    . Cardioversion N/A 08/22/2013    Procedure: CARDIOVERSION;  Surgeon: Luis Abed, MD;  Location: [facility] ENDOSCOPY;  Service: Cardiovascular;  Laterality: N/A;    History  Smoking status  . Former Smoker  Smokeless tobacco  . Never Used    History  Alcohol Use No    Family History  Problem Relation Age of Onset  . Heart disease Mother   . Heart disease Father   . Other Brother     brother had heart transplant  . Colon cancer Neg Hx     Review of Systems: The review of systems is per the HPI.  All other systems were reviewed and are negative.  Physical Exam: Ht 5\' 6"  (1.676 m)  Wt 133 lb 6.4 oz (60.51 kg)  BMI 21.54 kg/m2 Patient is very pleasant and in no acute distress. Skin is warm and dry. Color is normal.  HEENT is unremarkable. Normocephalic/atraumatic. PERRL. Sclera are nonicteric. Neck is supple. No masses. No JVD. Lungs are clear. Cardiac exam shows a regular rate and rhythm. Abdomen is soft. Extremities are without edema. Gait and ROM are intact. No gross neurologic deficits noted.  LABORATORY DATA: EKG today shows sinus rhythm.   Lab Results  Component Value Date   WBC 7.1 08/13/2013   HGB 14.2 08/13/2013   HCT 42.0 08/13/2013   PLT 203.0 08/13/2013   GLUCOSE 77 08/13/2013   CHOL 164 08/25/2011   TRIG 100.0 08/25/2011   HDL 52.60 08/25/2011   LDLCALC 91 08/25/2011   ALT 32 08/25/2011   AST 32 08/25/2011   NA 135 08/13/2013   K 4.6 08/13/2013   CL 101 08/13/2013   CREATININE 1.1 08/13/2013   BUN 15 08/13/2013   CO2 27 08/13/2013   TSH 1.76 07/23/2013   INR 1.5* 08/13/2013   Echo Study Conclusions from August 2014  - Left ventricle: The cavity size was mildly dilated. Wall thickness was increased in a pattern of mild LVH.  Systolic function was normal. The estimated ejection  fraction was in the range of 55% to 60%. Wall motion was normal; there were no regional wall motion abnormalities. - Mitral valve: Moderate regurgitation. - Left atrium: The atrium was mildly dilated. - Right atrium: The atrium was mildly dilated. - Tricuspid valve: Moderate regurgitation. - Pulmonary arteries: PA peak pressure: 35mm Hg (S).    Cardioversion:  Consent signed. Time out done  Anesthesia present. 120mg  IV Propofol given  Anterior-posterior pads............Marland Kitchenbiphasic defibrillator  120 J .. NSR  Successful cardioversion. One shock.  Jerral Bonito, MD      Assessment / Plan: 1. Atrial fib - s/p recent cardioversion - holding in sinus. I have left him on his current regimen. He is committed to long term anticoagulation - no issues with the Xarelto noted. See Dr. Shirlee Latch in 2 months to get reestablished.   2. CAD - no symptoms.  3. Chronic anticoagulation - no problems noted.   Patient is agreeable to this plan and will call if any problems develop in the interim.   Jimmy Macadamia, RN, ANP-C [facility] [address] [place], [place]  [address]

## 2013-09-07 NOTE — Patient Instructions (Addendum)
Continue with your current medicines  See Dr. Shirlee Latch in 2 months  Call the [facility] office at [phone] if you have any questions, problems or concerns.

## 2013-09-11 DIAGNOSIS — H18469 Peripheral corneal degeneration, unspecified eye: Secondary | ICD-10-CM | POA: Diagnosis not present

## 2013-09-11 DIAGNOSIS — H16109 Unspecified superficial keratitis, unspecified eye: Secondary | ICD-10-CM | POA: Diagnosis not present

## 2013-09-21 ENCOUNTER — Other Ambulatory Visit: Payer: Self-pay | Admitting: Cardiology

## 2013-09-23 ENCOUNTER — Other Ambulatory Visit: Payer: Self-pay | Admitting: Cardiology

## 2013-10-03 DIAGNOSIS — Z23 Encounter for immunization: Secondary | ICD-10-CM | POA: Diagnosis not present

## 2013-10-24 ENCOUNTER — Other Ambulatory Visit: Payer: Self-pay | Admitting: Cardiology

## 2013-11-08 ENCOUNTER — Ambulatory Visit: Payer: Medicare Other | Admitting: Cardiology

## 2013-11-09 ENCOUNTER — Ambulatory Visit (INDEPENDENT_AMBULATORY_CARE_PROVIDER_SITE_OTHER): Payer: Medicare Other | Admitting: Cardiology

## 2013-11-09 ENCOUNTER — Encounter: Payer: Self-pay | Admitting: Cardiology

## 2013-11-09 VITALS — BP 129/63 | HR 77 | Ht 66.0 in | Wt 134.0 lb

## 2013-11-09 DIAGNOSIS — I4949 Other premature depolarization: Secondary | ICD-10-CM

## 2013-11-09 DIAGNOSIS — I2510 Atherosclerotic heart disease of native coronary artery without angina pectoris: Secondary | ICD-10-CM | POA: Diagnosis not present

## 2013-11-09 DIAGNOSIS — I2584 Coronary atherosclerosis due to calcified coronary lesion: Secondary | ICD-10-CM

## 2013-11-09 DIAGNOSIS — E785 Hyperlipidemia, unspecified: Secondary | ICD-10-CM

## 2013-11-09 DIAGNOSIS — I4891 Unspecified atrial fibrillation: Secondary | ICD-10-CM

## 2013-11-09 LAB — CBC
HCT: 42.8 % (ref 39.0–52.0)
Hemoglobin: 14.5 g/dL (ref 13.0–17.0)
MCHC: 33.8 g/dL (ref 30.0–36.0)
RDW: 12.5 % (ref 11.5–14.6)
WBC: 6.3 10*3/uL (ref 4.5–10.5)

## 2013-11-09 LAB — BASIC METABOLIC PANEL
BUN: 18 mg/dL (ref 6–23)
Chloride: 102 mEq/L (ref 96–112)
Creatinine, Ser: 1.2 mg/dL (ref 0.4–1.5)
GFR: 63.88 mL/min (ref >60.00)
Glucose, Bld: 95 mg/dL (ref 70–99)
Potassium: 4.6 mEq/L (ref 3.5–5.1)

## 2013-11-09 LAB — LIPID PANEL: Cholesterol: 162 mg/dL (ref 0–200)

## 2013-11-09 NOTE — Patient Instructions (Signed)
The current medical regimen is effective;  continue present plan and medications.  Please have blood work today (BMP, CBC and Lipid).  Follow up in 6 months with Dr Shirlee Latch.  You will receive a letter in the mail 2 months before you are due.  Please call us when you receive this letter to schedule your follow up appointment.

## 2013-11-11 DIAGNOSIS — I4891 Unspecified atrial fibrillation: Secondary | ICD-10-CM | POA: Insufficient documentation

## 2013-11-11 NOTE — Progress Notes (Signed)
Patient ID: [name], male   DOB: 1936/01/23, 77 y.o.   MRN: [id number] PCP: Dr. Jacky Kindle  77 yo with history of CAD s/p CABG presents for cardiology followup.  His last cath was in 11/09.  The SVG-distal RCA was patent, the CFX system was patent.  His LIMA was atretic and there were serial 60% and 80% stenoses in the native LAD.  He was managed medically.  Echo in 8/14 showed EF 55-60% with moderate MR and moderate TR.   Since I last saw him, he was noted to develop persistent atrial fibrillation.  He was started on Xarelto and cardioverted to NSR in 8/14.  He remains in NSR today.  He denies tachypalpitations.  Last week, he had an episode that lasted for about 6 hours of unsteadiness and slowed speech.  This resolved on its own.  He had not had a similar episode before.  No chest pain.  No exertional dypsnea except when he carries a heavy load uphill.  He has had 3 nosebleeds with Xarelto that stopped on their own.      Labs (8/12): K 4.1, creatinine 1.2, LDL 91, HDL 53 Labs (8/14): K 4.6, creatinine 1.1  PMH: 1. CAD: 1st MI in 1988.  CABG 1996.  PCI to CFX in 2009.  LHC (11/09) SVG-dRCA patent, total occlusion RCA, patent CFX stent, atretic LIMA, serial 60 and 80% proximal LAD stenoses, EF 60% with basal inferior hypokinesis.  Myoview in 2011 with no ischemia or infarction.  Echo (10/12) with EF 55-60%, mild LVH, mild MR.  Echo (8/14) with EF 55-60%, moderate MR, moderate TR, PA systolic pressure 35 mmHg.  2. Raynauds 3. Post-polio syndrome 4. GERD with dilation of esophageal stricture in 12/12.  5. Hyperlipidemia 6. Brainstem stroke with Horner's syndrome.  7. Scoliosis. 8. H/o appendectomy 9. Herpes Zoster 10. Atrial fibrillation: DCCV to NSR in 8/14.   SH: quit tobacco while in his 42s.  Married.  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   FH: Brother with CAD, heart transplant.   ROS: All systems reviewed and negative except as per HPI.   Current Outpatient Prescriptions  Medication  Sig Dispense Refill  . acetaminophen (TYLENOL) 325 MG tablet Take 325 mg by mouth every 4 (four) hours as needed for pain.      . B Complex Vitamins (VITAMIN-B COMPLEX PO) Take by mouth daily.        . Bromfenac Sodium (PROLENSA OP) Apply to eye.      [name] BYSTOLIC 10 MG tablet TAKE 1 TABLET DAILY  30 tablet  6  . Cholecalciferol (VITAMIN D PO) Take by mouth every 30 (thirty) days. Taking 50,000 2x weeks      . lactobacillus acidophilus & bulgar (LACTINEX) chewable tablet Chew 2 tablets by mouth daily.       [name] LORazepam (ATIVAN) 0.5 MG tablet Take 0.5 mg by mouth as needed. Taking QHS      . LYRICA 50 MG capsule Take 50 mg by mouth 2 (two) times daily.       . magnesium oxide (MAG-OX) 400 MG tablet Take 400 mg by mouth daily.        [name] RANEXA 1000 MG SR tablet TAKE 1 TABLET BY MOUTH TWICE A DAY  180 tablet  3  . ranitidine (ZANTAC) 300 MG tablet TAKE 1 TABLET EVERY DAY  90 tablet  0  . Rivaroxaban (XARELTO) 20 MG TABS Take 1 tablet (20 mg total) by mouth daily.  30 tablet  6  .  rosuvastatin (CRESTOR) 40 MG tablet Take 40 mg by mouth daily.        . Testosterone (TESTODERM TD) Place 1 application onto the skin daily. 10% Testosterone       . traZODone (DESYREL) 50 MG tablet Take 50 mg by mouth at bedtime.         No current facility-administered medications for this visit.    BP 130/68  Pulse 66  Ht 5\' 6"  (1.676 m)  Wt 60.328 kg (133 lb)  BMI 21.47 kg/m2 General: NAD Neck: No JVD, no thyromegaly or thyroid nodule.  Lungs: Clear to auscultation bilaterally with normal respiratory effort. CV: Nondisplaced PMI.  Heart regular S1/S2, no S3/S4, no murmur.  No peripheral edema.  No carotid bruit.  Normal pedal pulses.  Abdomen: Soft, nontender, no hepatosplenomegaly, no distention.  Neurologic: Alert and oriented x 3.  Psych: Normal affect.  Assessment/Plan: 1. Atrial fibrillation: He remains in NSR today after cardioversion in 8/14.  Continue Xarelto and nebivolol.  He had an episode last  week that could have been a TIA.  Symptoms resolved in 6 hours.  He did not go to the hospital.  He has not missed Xarelto doses.  If these symptoms recur, he needs to go to the ER for evaluation and head CT to rule out CNS bleed.  He is on Ranexa for angina.  This actually may help hold him in NSR.  2. CAD: Stable with no ischemic symptoms.  He is not taking aspirin as he has stable CAD and is on Xarelto.  Continue nebivolol and Crestor.  3. Hyperlipidemia: Check lipids on Crestor today.   Marca Ancona 11/11/2013

## 2014-01-09 ENCOUNTER — Other Ambulatory Visit: Payer: Self-pay

## 2014-01-14 ENCOUNTER — Encounter: Payer: Self-pay | Admitting: Nurse Practitioner

## 2014-01-14 ENCOUNTER — Telehealth: Payer: Self-pay | Admitting: Cardiology

## 2014-01-14 DIAGNOSIS — Z125 Encounter for screening for malignant neoplasm of prostate: Secondary | ICD-10-CM | POA: Diagnosis not present

## 2014-01-14 DIAGNOSIS — M899 Disorder of bone, unspecified: Secondary | ICD-10-CM | POA: Diagnosis not present

## 2014-01-14 DIAGNOSIS — I10 Essential (primary) hypertension: Secondary | ICD-10-CM | POA: Diagnosis not present

## 2014-01-14 DIAGNOSIS — I4891 Unspecified atrial fibrillation: Secondary | ICD-10-CM | POA: Diagnosis not present

## 2014-01-14 DIAGNOSIS — E785 Hyperlipidemia, unspecified: Secondary | ICD-10-CM | POA: Diagnosis not present

## 2014-01-14 DIAGNOSIS — R42 Dizziness and giddiness: Secondary | ICD-10-CM | POA: Diagnosis not present

## 2014-01-14 DIAGNOSIS — I6789 Other cerebrovascular disease: Secondary | ICD-10-CM | POA: Diagnosis not present

## 2014-01-14 DIAGNOSIS — M949 Disorder of cartilage, unspecified: Secondary | ICD-10-CM | POA: Diagnosis not present

## 2014-01-14 DIAGNOSIS — R269 Unspecified abnormalities of gait and mobility: Secondary | ICD-10-CM | POA: Diagnosis not present

## 2014-01-14 DIAGNOSIS — IMO0002 Reserved for concepts with insufficient information to code with codable children: Secondary | ICD-10-CM | POA: Diagnosis not present

## 2014-01-14 DIAGNOSIS — E291 Testicular hypofunction: Secondary | ICD-10-CM | POA: Diagnosis not present

## 2014-01-14 DIAGNOSIS — I2510 Atherosclerotic heart disease of native coronary artery without angina pectoris: Secondary | ICD-10-CM | POA: Diagnosis not present

## 2014-01-14 DIAGNOSIS — R112 Nausea with vomiting, unspecified: Secondary | ICD-10-CM | POA: Diagnosis not present

## 2014-01-14 NOTE — Telephone Encounter (Signed)
New Message  [name] at [facility] called from Dr Reynaldo [facility] Office states that the pt has an Acute onset of dizziness, EKG Afib rate 80, BP stable, unsteady gates and Nausea.  Made appt with Truitt Merle, NP for 01/20 at 10:30 am

## 2014-01-14 NOTE — Telephone Encounter (Signed)
Follow up ° ° ° ° ° °Returned Anne's call °

## 2014-01-14 NOTE — Telephone Encounter (Signed)
I confirmed HR is controlled and pt is taking Xarelto.

## 2014-01-14 NOTE — Telephone Encounter (Signed)
Spoke with [place], NP. She states she does not think at fib is cause of current symptoms  of nausea/vomiting/dizziness, it may be acute vertigo or viral. Pt is getting MRI now of his head.  Pt has been given something for nausea and is no longer vomiting.  I offered appt with Dr Aundra Dubin this afternoon but she thought tomorrow with Cecille Rubin was OK.

## 2014-01-14 NOTE — Telephone Encounter (Signed)
Pt's wife made aware of appt tomorrow with Tera Helper.

## 2014-01-15 ENCOUNTER — Ambulatory Visit (INDEPENDENT_AMBULATORY_CARE_PROVIDER_SITE_OTHER): Payer: Medicare Other | Admitting: Nurse Practitioner

## 2014-01-15 ENCOUNTER — Encounter: Payer: Self-pay | Admitting: Nurse Practitioner

## 2014-01-15 ENCOUNTER — Other Ambulatory Visit: Payer: Self-pay

## 2014-01-15 ENCOUNTER — Other Ambulatory Visit: Payer: Self-pay | Admitting: Nurse Practitioner

## 2014-01-15 VITALS — BP 120/80 | HR 82 | Ht 66.0 in | Wt 141.0 lb

## 2014-01-15 DIAGNOSIS — I4891 Unspecified atrial fibrillation: Secondary | ICD-10-CM

## 2014-01-15 LAB — PROTIME-INR
INR: 2.6 ratio — ABNORMAL HIGH (ref 0.8–1.0)
Prothrombin Time: 27.3 s — ABNORMAL HIGH (ref 10.2–12.4)

## 2014-01-15 LAB — APTT: aPTT: 33.8 s — ABNORMAL HIGH (ref 21.7–28.8)

## 2014-01-15 MED ORDER — RIVAROXABAN 20 MG PO TABS: 20.0000 mg | ORAL_TABLET | Freq: Every day | ORAL | Status: DC

## 2014-01-15 NOTE — Progress Notes (Addendum)
[name] Date of Birth: 1936/02/15 Medical Record #[id number]  History of Present Illness: Mr. Hodes is seen back today for a work in  visit. Seen for [name]. Former patient of Dr. [name]. He has known CAD with MI dating back to 1988, PCI in 1996 with subsequent past CABG in 1996 due to restenosis. Stents to the LCX in 2009. [name] Last cath dates back to 2009. Had a SVG to the distal RCA patent and the CFX system was patent. LIMA was atretic and there were serial 60 to 80% stenoses in the native LAD. He was managed medically. He is on chronic Ranexa.   Other issues include GERD, recent herpes zoster with post herpetic neuralgia, post polio syndrome, PVCs, HLD, prior brainstem stroke with residual horner syndrome in 1996 following a cath and polyps. Did have atrial fib back in July - was placed on Xarelto and was cardioverted back to sinus rhythm. Echo showed normal EF with mild biatrial enlargement.   Had an acute onset of dizziness with N, V yesterday. Was to go to [facility] for his labs due to upcoming physical. While he was getting ready - he had dizziness and was off balance. Riding in the car to the office made things worse - got nauseated. Very similar to his presentation back in the summer. MRI of the brain showed no acute findings with very mild nonspecific hemispheric white matter changes, most commonly secondary to chronic microvascular disease. Referred back here for his atrial fib.   Comes in today. Here with [name], his wife. He remains "weak". His episode of dizziness/N, V is identical to what he had back in the summer. Feels off balance. Not aware of his rhythm. Had not been checking his pulse. No chest pain. Has not missed any Xarelto.   Current Outpatient Prescriptions  Medication Sig Dispense Refill  . acetaminophen (TYLENOL) 325 MG tablet Take 325 mg by mouth every 4 (four) hours as needed for pain.      . B Complex Vitamins (VITAMIN-B COMPLEX PO) Take by mouth  daily.        [name] BYSTOLIC 10 MG tablet TAKE 1 TABLET DAILY  30 tablet  6  . Cholecalciferol (VITAMIN D PO) Take by mouth as directed. Taking 50,000 2x weeks      . lactobacillus acidophilus & bulgar (LACTINEX) chewable tablet Chew 2 tablets by mouth daily.       [name] LORazepam (ATIVAN) 0.5 MG tablet Take 0.5 mg by mouth as needed. Taking QHS      . LYRICA 50 MG capsule Take 50 mg by mouth daily. Slowly decreasing dosage      . magnesium oxide (MAG-OX) 400 MG tablet Take 400 mg by mouth daily.        [name] RANEXA 1000 MG SR tablet TAKE 1 TABLET BY MOUTH TWICE A DAY  180 tablet  3  . ranitidine (ZANTAC) 300 MG tablet TAKE 1 TABLET EVERY DAY  90 tablet  0  . Rivaroxaban (XARELTO) 20 MG TABS Take 1 tablet (20 mg total) by mouth daily.  30 tablet  6  . rosuvastatin (CRESTOR) 40 MG tablet Take 40 mg by mouth daily.        . Testosterone (TESTODERM TD) Place 1 application onto the skin daily. 10% Testosterone       . traZODone (DESYREL) 50 MG tablet Take 50 mg by mouth at bedtime.         No current facility-administered medications for this visit.  Allergies  Allergen Reactions  . Morphine And Related Other (See Comments)    Irritation    Past Medical History  Diagnosis Date  . Coronary artery disease     First MI in 1988. PCI in 1996 with subsequent CABG in 1996 due to restenosis. S/P stents to LCX in 2009. Noted to have residual disease in the LAD in a diffuse manner and atretic LIMA graft. He is managed medically.   [name] History of Raynaud's syndrome   . 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 GERD (gastroesophageal reflux disease)   . Hyperlipidemia   . PVC (premature ventricular contraction)   . 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 Scoliosis     mild  . Abnormal nuclear cardiac imaging test March 2011    Has positive EKG response, no perfusion defect and normal EF  . Internal hemorrhoids   . Adenomatous colon polyp 12/2002    Past Surgical History    Procedure Laterality Date  . Coronary artery bypass graft  1996    with a LIMA to the LAD, SVG to RCA and SVG to OM  . Coronary stent placement  2009     LCX  . Appendectomy      Age 32  . Colonoscopy  2008  . Tonsillectomy    . Cardioversion N/A 08/22/2013    Procedure: CARDIOVERSION;  Surgeon: Carlena Bjornstad, MD;  Location: [facility] ENDOSCOPY;  Service: Cardiovascular;  Laterality: N/A;    History  Smoking status  . Former Smoker  Smokeless tobacco  . Never Used    History  Alcohol Use No    Family History  Problem Relation Age of Onset  . Heart disease Mother   . Heart disease Father   . Other Brother     brother had heart transplant  . Colon cancer Neg Hx     Review of Systems: The review of systems is per the HPI.  All other systems were reviewed and are negative.  Physical Exam: BP 120/80  Pulse 82  Ht 5\' 6"  (1.676 m)  Wt 141 lb (63.957 kg)  BMI 22.77 kg/m2  SpO2 97% Patient is very pleasant and in no acute distress. Skin is warm and dry. Color is normal.  HEENT is unremarkable. Normocephalic/atraumatic. PERRL. Sclera are nonicteric. Neck is supple. No masses. No JVD. Lungs are clear. Cardiac exam shows an irregular rhythm. Rate a little faster on my exam. Abdomen is soft. Extremities are without edema. Gait and ROM are intact. No gross neurologic deficits noted.  LABORATORY DATA: MRI results noted.   EKG yesterday showed atrial fib with controlled VR   Lab Results  Component Value Date   WBC 6.3 11/09/2013   HGB 14.5 11/09/2013   HCT 42.8 11/09/2013   PLT 195.0 11/09/2013   GLUCOSE 95 11/09/2013   CHOL 162 11/09/2013   TRIG 74.0 11/09/2013   HDL 56.00 11/09/2013   LDLCALC 91 11/09/2013   ALT 32 08/25/2011   AST 32 08/25/2011   NA 136 11/09/2013   K 4.6 11/09/2013   CL 102 11/09/2013   CREATININE 1.2 11/09/2013   BUN 18 11/09/2013   CO2 30 11/09/2013   TSH 1.76 07/23/2013   INR 1.5* 08/13/2013   Echo Study Conclusions from August 2014 - Left  ventricle: The cavity size was mildly dilated. Wall thickness was increased in a pattern of mild LVH. Systolic function was normal. The estimated ejection fraction was  in the range of 55% to 60%. Wall motion was normal; there were no regional wall motion abnormalities. - Mitral valve: Moderate regurgitation. - Left atrium: The atrium was mildly dilated. - Right atrium: The atrium was mildly dilated. - Tricuspid valve: Moderate regurgitation. - Pulmonary arteries: PA peak pressure: 68mm Hg (S).    Assessment / Plan:  1. Recurrent atrial fib - on Xarelto - rate is controlled - I think our options are limited but he will need antiarrhythmic therapy to stay in sinus(discussed in depth yesterday with [name] who suggested Multaq - he is not really interested in starting Multaq. Prolonged QT could be problematic with either Multaq, Tikosyn, etc - he will need to get off of his Trazodone due to this interaction. He favors repeat cardioversion and then Tikosyn loading. He will need to alter his work schedule in the process to proceed with Tikosyn loading. I have arranged for cardioversion tomorrow with Dr. Stanford Breed - it will be interesting to see if his current symptoms resolve with restoration of sinus rhythm. This is his identical presentation from July.   2. CAD - no chest pain reported.   3. Past stroke - remains on Xarelto.   Patient is agreeable to this plan and will call if any problems develop in the interim.   Burtis Junes, RN, [place] [address] [place], [place]  [id number] [phone]   Addendum: His CBC and CMET are completely normal. TSH was normal.

## 2014-01-15 NOTE — Patient Instructions (Addendum)
Stay on your current medicines for now  We will need to recheck labs today - will get your other labs from Dr. Jacquiline Doe office  We will arrange for a cardioversion for tomorrow  You are scheduled for a cardioversion on Wednesday, January 21st at 2pm with Dr. Stanford Breed or associates. Please go to [facility]nd [place] Stay on Wednesday, January 21st at 12:30 pm.  Enter through the [place] not have any food or drink after midnight tonight.  You may take your medicines with a sip of water on the day of your procedure.  You will need someone to drive you home following your procedure.    We will get you a visit to see the pharmacist here for [facility] admission - she will be contacting your - Ronnald Collum, PharmD - she will instruct you about when to stop the Trazodone

## 2014-01-16 ENCOUNTER — Encounter (HOSPITAL_COMMUNITY)
Admission: RE | Disposition: A | Discharge status: Home / Self Care | Payer: Self-pay | Source: Ambulatory Visit | Attending: Cardiology

## 2014-01-16 ENCOUNTER — Encounter (HOSPITAL_COMMUNITY): Payer: Medicare Other | Admitting: Certified Registered Nurse Anesthetist

## 2014-01-16 ENCOUNTER — Encounter (HOSPITAL_COMMUNITY): Payer: Self-pay | Admitting: *Deleted

## 2014-01-16 ENCOUNTER — Ambulatory Visit (HOSPITAL_COMMUNITY): Payer: Medicare Other | Admitting: Certified Registered Nurse Anesthetist

## 2014-01-16 ENCOUNTER — Ambulatory Visit (HOSPITAL_COMMUNITY)
Admission: RE | Admit: 2014-01-16 | Discharge: 2014-01-16 | Disposition: A | Discharge status: Home / Self Care | Payer: Medicare Other | Source: Ambulatory Visit | Attending: Cardiology | Admitting: Cardiology

## 2014-01-16 DIAGNOSIS — I4891 Unspecified atrial fibrillation: Secondary | ICD-10-CM | POA: Diagnosis not present

## 2014-01-16 DIAGNOSIS — I2510 Atherosclerotic heart disease of native coronary artery without angina pectoris: Secondary | ICD-10-CM | POA: Diagnosis not present

## 2014-01-16 DIAGNOSIS — Z7901 Long term (current) use of anticoagulants: Secondary | ICD-10-CM | POA: Insufficient documentation

## 2014-01-16 DIAGNOSIS — G909 Disorder of the autonomic nervous system, unspecified: Secondary | ICD-10-CM | POA: Insufficient documentation

## 2014-01-16 DIAGNOSIS — I252 Old myocardial infarction: Secondary | ICD-10-CM | POA: Insufficient documentation

## 2014-01-16 DIAGNOSIS — I69998 Other sequelae following unspecified cerebrovascular disease: Secondary | ICD-10-CM | POA: Diagnosis not present

## 2014-01-16 DIAGNOSIS — Z87891 Personal history of nicotine dependence: Secondary | ICD-10-CM | POA: Insufficient documentation

## 2014-01-16 DIAGNOSIS — Z9861 Coronary angioplasty status: Secondary | ICD-10-CM | POA: Diagnosis not present

## 2014-01-16 DIAGNOSIS — K219 Gastro-esophageal reflux disease without esophagitis: Secondary | ICD-10-CM | POA: Insufficient documentation

## 2014-01-16 DIAGNOSIS — B91 Sequelae of poliomyelitis: Secondary | ICD-10-CM | POA: Insufficient documentation

## 2014-01-16 DIAGNOSIS — Z951 Presence of aortocoronary bypass graft: Secondary | ICD-10-CM | POA: Diagnosis not present

## 2014-01-16 HISTORY — PX: CARDIOVERSION: SHX1299

## 2014-01-16 SURGERY — CARDIOVERSION
Anesthesia: General

## 2014-01-16 MED ORDER — PROPOFOL 10 MG/ML IV BOLUS
INTRAVENOUS | Status: DC | PRN
  Administered 2014-01-16: 50 mg via INTRAVENOUS

## 2014-01-16 MED ORDER — SODIUM CHLORIDE 0.9 % IV SOLN
INTRAVENOUS | Status: DC | PRN
  Administered 2014-01-16: 13:00:00 via INTRAVENOUS

## 2014-01-16 MED ORDER — SODIUM CHLORIDE 0.9 % IV SOLN
INTRAVENOUS | Status: DC
Start: 2014-01-16 — End: 2014-01-16
  Administered 2014-01-16: 500 mL via INTRAVENOUS

## 2014-01-16 NOTE — Procedures (Signed)
Electrical Cardioversion Procedure Note [name] [id number] [birth year]  Procedure: Electrical Cardioversion Indications:  Atrial Fibrillation  Procedure Details Consent: Risks of procedure as well as the alternatives and risks of each were explained to the (patient/caregiver).  Consent for procedure obtained. Time Out: Verified patient identification, verified procedure, site/side was marked, verified correct patient position, special equipment/implants available, medications/allergies/relevent history reviewed, required imaging and test results available.  Performed  Patient placed on cardiac monitor, pulse oximetry, supplemental oxygen as necessary.  Sedation given: Patient sedated by anesthesia with diprovan 50 mg IV. Pacer pads placed anterior and posterior chest.  Cardioverted 1 time(s).  Cardioverted at 120J.  Evaluation Findings: Post procedure EKG shows: NSR Complications: None Patient did tolerate procedure well.   Kirk Ruths 01/16/2014, 12:53 PM

## 2014-01-16 NOTE — Anesthesia Postprocedure Evaluation (Signed)
  Anesthesia Post-op Note  Patient: [name]  Procedure(s) Performed: Procedure(s): CARDIOVERSION (N/A)  Patient Location: Endoscopy Unit  Anesthesia Type:General  Level of Consciousness: awake, alert  and oriented  Airway and Oxygen Therapy: Patient Spontanous Breathing and Patient connected to nasal cannula oxygen  Post-op Pain: none  Post-op Assessment: Post-op Vital signs reviewed, Patient's Cardiovascular Status Stable, Respiratory Function Stable, Patent Airway and No signs of Nausea or vomiting  Post-op Vital Signs: Reviewed and stable  Complications: No apparent anesthesia complications

## 2014-01-16 NOTE — Transfer of Care (Signed)
Immediate Anesthesia Transfer of Care Note  Patient: [name]  Procedure(s) Performed: Procedure(s): CARDIOVERSION (N/A)  Patient Location: Endoscopy Unit  Anesthesia Type:General  Level of Consciousness: sedated  Airway & Oxygen Therapy: Patient Spontanous Breathing and Patient connected to nasal cannula oxygen  Post-op Assessment: Report given to PACU RN and Post -op Vital signs reviewed and stable  Post vital signs: Reviewed and stable  Complications: No apparent anesthesia complications

## 2014-01-16 NOTE — Preoperative (Signed)
Beta Blockers   Reason not to administer Beta Blockers:Not Applicable 

## 2014-01-16 NOTE — H&P (Signed)
**Note [name]** [name]  01/15/2014 10:30 AM   Office Visit  MRN:  [id number]   Description: 78 year old male  Provider: Burtis Junes, NP  Department: [facility]        Referring Provider    Geoffery Lyons, MD      Diagnoses    Atrial fibrillation    -  Primary    427.31      Reason for Visit    Atrial Fibrillation    Work in visit. Seen for [name].          Progress Notes    Burtis Junes, NP at 01/15/2014 10:26 AM    Status: Addendum               [name] Date of Birth: Aug 28, 1936 Medical Record #[id number]   History of Present Illness: [name] is seen back today for a work in  visit. Seen for [name]. Former patient of Dr. [name]. He has known CAD with MI dating back to 1988, PCI in 1996 with subsequent past CABG in 1996 due to restenosis. Stents to the LCX in 2009. [name] Last cath dates back to 2009. Had a SVG to the distal RCA patent and the CFX system was patent. LIMA was atretic and there were serial 60 to 80% stenoses in the native LAD. He was managed medically. He is on chronic Ranexa.     Other issues include GERD, recent herpes zoster with post herpetic neuralgia, post polio syndrome, PVCs, HLD, prior brainstem stroke with residual horner syndrome in 1996 following a cath and polyps. Did have atrial fib back in July - was placed on Xarelto and was cardioverted back to sinus rhythm. Echo showed normal EF with mild biatrial enlargement.    Had an acute onset of dizziness with N, V yesterday. Was to go to [facility] for his labs due to upcoming physical. While he was getting ready - he had dizziness and was off balance. Riding in the car to the office made things worse - got nauseated. Very similar to his presentation back in the summer. MRI of the brain showed no acute findings with very mild nonspecific hemispheric white matter changes, most commonly secondary to chronic microvascular disease. Referred back here for his atrial fib.     Comes in today. Here with Gwinda Weiss, his wife. He remains "weak". His episode of dizziness/N, V is identical to what he had back in the summer. Feels off balance. Not aware of his rhythm. Had not been checking his pulse. No chest pain. Has not missed any Xarelto.     Current Outpatient Prescriptions   Medication  Sig  Dispense  Refill   .  acetaminophen (TYLENOL) 325 MG tablet  Take 325 mg by mouth every 4 (four) hours as needed for pain.         .  B Complex Vitamins (VITAMIN-B COMPLEX PO)  Take by mouth daily.           [name]  BYSTOLIC 10 MG tablet  TAKE 1 TABLET DAILY   30 tablet   6   .  Cholecalciferol (VITAMIN D PO)  Take by mouth as directed. Taking 50,000 2x weeks         .  lactobacillus acidophilus & bulgar (LACTINEX) chewable tablet  Chew 2 tablets by mouth daily.          [name]  LORazepam (ATIVAN) 0.5 MG tablet  Take 0.5 mg by  mouth as needed. Taking QHS         .  LYRICA 50 MG capsule  Take 50 mg by mouth daily. Slowly decreasing dosage         .  magnesium oxide (MAG-OX) 400 MG tablet  Take 400 mg by mouth daily.           [name]  RANEXA 1000 MG SR tablet  TAKE 1 TABLET BY MOUTH TWICE A DAY   180 tablet   3   .  ranitidine (ZANTAC) 300 MG tablet  TAKE 1 TABLET EVERY DAY   90 tablet   0   .  Rivaroxaban (XARELTO) 20 MG TABS  Take 1 tablet (20 mg total) by mouth daily.   30 tablet   6   .  rosuvastatin (CRESTOR) 40 MG tablet  Take 40 mg by mouth daily.           .  Testosterone (TESTODERM TD)  Place 1 application onto the skin daily. 10% Testosterone          .  traZODone (DESYREL) 50 MG tablet  Take 50 mg by mouth at bedtime.               No current facility-administered medications for this visit.         Allergies   Allergen  Reactions   .  Morphine And Related  Other (See Comments)       Irritation         Past Medical History   Diagnosis  Date   .  Coronary artery disease         First MI in 1988. PCI in 1996 with subsequent CABG in 1996 due to restenosis. S/P stents to LCX  in 2009. Noted to have residual disease in the LAD in a diffuse manner and atretic LIMA graft. He is managed medically.    [name]  History of Raynaud's syndrome     .  History of post-polio syndrome         as child   .  GERD (gastroesophageal reflux disease)     .  Hyperlipidemia     .  PVC (premature ventricular contraction)     .  Brainstem stroke  1996       with residual horner syndrome   .  Scoliosis         mild   .  Abnormal nuclear cardiac imaging test  March 2011       Has positive EKG response, no perfusion defect and normal EF   .  Internal hemorrhoids     .  Adenomatous colon polyp  12/2002         Past Surgical History   Procedure  Laterality  Date   .  Coronary artery bypass graft    1996       with a LIMA to the LAD, SVG to RCA and SVG to OM   .  Coronary stent placement    2009        LCX   .  Appendectomy           Age 9   .  Colonoscopy    2008   .  Tonsillectomy       .  Cardioversion  N/A  08/22/2013       Procedure: CARDIOVERSION;  Surgeon: Carlena Bjornstad, MD;  Location: [facility];  Service: Cardiovascular;  Laterality: N/A;  History   Smoking status   .  Former Smoker   Smokeless tobacco   .  Never Used         History   Alcohol Use  No         Family History   Problem  Relation  Age of Onset   .  Heart disease  Mother     .  Heart disease  Father     .  Other  Brother         brother had heart transplant   .  Colon cancer  Neg Hx          Review of Systems: The review of systems is per the HPI.  All other systems were reviewed and are negative.   Physical Exam: BP 120/80  Pulse 82  Ht 5\' 6"  (1.676 m)  Wt 141 lb (63.957 kg)  BMI 22.77 kg/m2  SpO2 97% Patient is very pleasant and in no acute distress. Skin is warm and dry. Color is normal.  HEENT is unremarkable. Normocephalic/atraumatic. PERRL. Sclera are nonicteric. Neck is supple. No masses. No JVD. Lungs are clear. Cardiac exam shows an irregular rhythm. Rate a  little faster on my exam. Abdomen is soft. Extremities are without edema. Gait and ROM are intact. No gross neurologic deficits noted.   LABORATORY DATA: MRI results noted.    EKG yesterday showed atrial fib with controlled VR    Lab Results   Component  Value  Date     WBC  6.3  11/09/2013     HGB  14.5  11/09/2013     HCT  42.8  11/09/2013     PLT  195.0  11/09/2013     GLUCOSE  95  11/09/2013     CHOL  162  11/09/2013     TRIG  74.0  11/09/2013     HDL  56.00  11/09/2013     LDLCALC  91  11/09/2013     ALT  32  08/25/2011     AST  32  08/25/2011     NA  136  11/09/2013     K  4.6  11/09/2013     CL  102  11/09/2013     CREATININE  1.2  11/09/2013     BUN  18  11/09/2013     CO2  30  11/09/2013     TSH  1.76  07/23/2013     INR  1.5*  08/13/2013      Echo Study Conclusions from August 2014 - Left ventricle: The cavity size was mildly dilated. Wall thickness was increased in a pattern of mild LVH. Systolic function was normal. The estimated ejection fraction was in the range of 55% to 60%. Wall motion was normal; there were no regional wall motion abnormalities. - Mitral valve: Moderate regurgitation. - Left atrium: The atrium was mildly dilated. - Right atrium: The atrium was mildly dilated. - Tricuspid valve: Moderate regurgitation. - Pulmonary arteries: PA peak pressure: 64mm Hg (S).       Assessment / Plan:   1. Recurrent atrial fib - on Xarelto - rate is controlled - I think our options are limited but he will need antiarrhythmic therapy to stay in sinus(discussed in depth yesterday with [name] who suggested Multaq - he is not really interested in starting Multaq. Prolonged QT could be problematic with either Multaq, Tikosyn, etc - he will need to get off of his Trazodone due to this  interaction. He favors repeat cardioversion and then Tikosyn loading. He will need to alter his work schedule in the process to proceed with Tikosyn loading. I have arranged for  cardioversion tomorrow with Dr. Stanford Breed - it will be interesting to see if his current symptoms resolve with restoration of sinus rhythm. This is his identical presentation from July.    2. CAD - no chest pain reported.    3. Past stroke - remains on Xarelto.    Patient is agreeable to this plan and will call if any problems develop in the interim.    Burtis Junes, RN, [facility] [address] [place], [place]  [address] [phone]     Addendum: His CBC and CMET are completely normal. TSH was normal.      Revision History       Date/Time User Action    > 01/15/2014 12:51 PM Burtis Junes, NP Addend      01/15/2014 11:15 AM Burtis Junes, NP Sign            Forr DCCV; no changes. Kirk Ruths

## 2014-01-16 NOTE — Discharge Instructions (Signed)
Conscious Sedation, Adult, Care After °Refer to this sheet in the next few weeks. These instructions provide you with information on caring for yourself after your procedure. Your health care provider may also give you more specific instructions. Your treatment has been planned according to current medical practices, but problems sometimes occur. Call your health care provider if you have any problems or questions after your procedure. °WHAT TO EXPECT AFTER THE PROCEDURE  °After your procedure: °· You may feel sleepy, clumsy, and have poor balance for several hours. °· Vomiting may occur if you eat too soon after the procedure. °HOME CARE INSTRUCTIONS °· Do not participate in any activities where you could become injured for at least 24 hours. Do not: °· Drive. °· Swim. °· Ride a bicycle. °· Operate heavy machinery. °· Cook. °· Use power tools. °· Climb ladders. °· Work from a high place. °· Do not make important decisions or sign legal documents until you are improved. °· If you vomit, drink water, juice, or soup when you can drink without vomiting. Make sure you have little or no nausea before eating solid foods. °· Only take over-the-counter or prescription medicines for pain, discomfort, or fever as directed by your health care provider. °· Make sure you and your family fully understand everything about the medicines given to you, including what side effects may occur. °· You should not drink alcohol, take sleeping pills, or take medicines that cause drowsiness for at least 24 hours. °· If you smoke, do not smoke without supervision. °· If you are feeling better, you may resume normal activities 24 hours after you were sedated. °· Keep all appointments with your health care provider. °SEEK MEDICAL CARE IF: °· Your skin is pale or bluish in color. °· You continue to feel nauseous or vomit. °· Your pain is getting worse and is not helped by medicine. °· You have bleeding or swelling. °· You are still sleepy or  feeling clumsy after 24 hours. °SEEK IMMEDIATE MEDICAL CARE IF: °· You develop a rash. °· You have difficulty breathing. °· You develop any type of allergic problem. °· You have a fever. °MAKE SURE YOU: °· Understand these instructions. °· Will watch your condition. °· Will get help right away if you are not doing well or get worse. °Document Released: 10/03/2013 Document Reviewed: 07/20/2013 °Exit[employer]® Patient Information ©2014 [employer]. ° °

## 2014-01-16 NOTE — Anesthesia Preprocedure Evaluation (Addendum)
Anesthesia Evaluation  Patient identified by MRN, date of birth, ID band  Reviewed: Allergy & Precautions, H&P , NPO status , Patient's Chart, lab work & pertinent test results, reviewed documented beta blocker date and time   History of Anesthesia Complications Negative for: history of anesthetic complications  Airway Mallampati: I TM Distance: >3 FB Neck ROM: Limited    Dental  (+) Teeth Intact and Dental Advisory Given   Pulmonary former smoker,          Cardiovascular + CAD + dysrhythmias Atrial Fibrillation     Neuro/Psych CVA, No Residual Symptoms    GI/Hepatic GERD-  Medicated and Controlled,  Endo/Other    Renal/GU      Musculoskeletal   Abdominal   Peds  Hematology   Anesthesia Other Findings   Reproductive/Obstetrics                          Anesthesia Physical Anesthesia Plan  ASA: III  Anesthesia Plan: General   Post-op Pain Management:    Induction: Intravenous  Airway Management Planned: Mask  Additional Equipment:   Intra-op Plan:   Post-operative Plan:   Informed Consent: I have reviewed the patients History and Physical, chart, labs and discussed the procedure including the risks, benefits and alternatives for the proposed anesthesia with the patient or authorized representative who has indicated his/her understanding and acceptance.   Dental advisory given  Plan Discussed with: CRNA and Anesthesiologist  Anesthesia Plan Comments:         Anesthesia Quick Evaluation

## 2014-01-16 NOTE — Progress Notes (Signed)
xarelto was approved on 12/2013 until 01/15/15. PA #[id number] ID #[id number]

## 2014-01-17 ENCOUNTER — Encounter (HOSPITAL_COMMUNITY): Payer: Self-pay | Admitting: Cardiology

## 2014-01-18 ENCOUNTER — Other Ambulatory Visit: Payer: Self-pay | Admitting: Cardiology

## 2014-01-18 ENCOUNTER — Telehealth: Payer: Self-pay | Admitting: Nurse Practitioner

## 2014-01-18 DIAGNOSIS — L723 Sebaceous cyst: Secondary | ICD-10-CM | POA: Diagnosis not present

## 2014-01-18 DIAGNOSIS — I789 Disease of capillaries, unspecified: Secondary | ICD-10-CM | POA: Diagnosis not present

## 2014-01-18 DIAGNOSIS — L819 Disorder of pigmentation, unspecified: Secondary | ICD-10-CM | POA: Diagnosis not present

## 2014-01-18 DIAGNOSIS — D239 Other benign neoplasm of skin, unspecified: Secondary | ICD-10-CM | POA: Diagnosis not present

## 2014-01-18 DIAGNOSIS — L821 Other seborrheic keratosis: Secondary | ICD-10-CM | POA: Diagnosis not present

## 2014-01-18 NOTE — Telephone Encounter (Signed)
Can we call and see if he feels better since he was cardioverted?   Make sure he has a visit with Gay Filler to discuss Tikosyn

## 2014-01-18 NOTE — Telephone Encounter (Signed)
Returned call to patient he stated he felt much better.Stated his pulse 62.Pharmacist Gay Filler will be calling to discuss Tikosyn.

## 2014-01-18 NOTE — Telephone Encounter (Signed)
New Problem:  Pt states he is returning a call to [facility] about a cardioversion. Pt would like a call back.

## 2014-01-18 NOTE — Telephone Encounter (Signed)
Returned call to patient he stated he was returning [place] call.Message sent to [facility].

## 2014-01-21 ENCOUNTER — Ambulatory Visit: Payer: Medicare Other | Admitting: Cardiology

## 2014-01-21 DIAGNOSIS — I2510 Atherosclerotic heart disease of native coronary artery without angina pectoris: Secondary | ICD-10-CM | POA: Diagnosis not present

## 2014-01-21 DIAGNOSIS — I8390 Asymptomatic varicose veins of unspecified lower extremity: Secondary | ICD-10-CM | POA: Diagnosis not present

## 2014-01-21 DIAGNOSIS — I10 Essential (primary) hypertension: Secondary | ICD-10-CM | POA: Diagnosis not present

## 2014-01-21 DIAGNOSIS — E785 Hyperlipidemia, unspecified: Secondary | ICD-10-CM | POA: Diagnosis not present

## 2014-01-21 DIAGNOSIS — E291 Testicular hypofunction: Secondary | ICD-10-CM | POA: Diagnosis not present

## 2014-01-21 DIAGNOSIS — I4891 Unspecified atrial fibrillation: Secondary | ICD-10-CM | POA: Diagnosis not present

## 2014-01-21 DIAGNOSIS — Z125 Encounter for screening for malignant neoplasm of prostate: Secondary | ICD-10-CM | POA: Diagnosis not present

## 2014-01-21 DIAGNOSIS — Z0000 Encounter for general adult medical examination without abnormal findings: Secondary | ICD-10-CM | POA: Diagnosis not present

## 2014-01-23 ENCOUNTER — Ambulatory Visit (INDEPENDENT_AMBULATORY_CARE_PROVIDER_SITE_OTHER): Payer: Medicare Other | Admitting: Pharmacist

## 2014-01-23 ENCOUNTER — Ambulatory Visit (INDEPENDENT_AMBULATORY_CARE_PROVIDER_SITE_OTHER): Payer: Medicare Other | Admitting: Internal Medicine

## 2014-01-23 ENCOUNTER — Encounter: Payer: Self-pay | Admitting: Internal Medicine

## 2014-01-23 VITALS — BP 148/76 | HR 70 | Ht 66.0 in | Wt 136.0 lb

## 2014-01-23 DIAGNOSIS — I4891 Unspecified atrial fibrillation: Secondary | ICD-10-CM | POA: Diagnosis not present

## 2014-01-23 NOTE — Patient Instructions (Addendum)
Your physician recommends that you continue on your current medications as directed. Please refer to the Current Medication list given to you today.  Your physician recommends that you schedule a follow-up appointment in: 6-8 weeks with [name]

## 2014-01-23 NOTE — Progress Notes (Signed)
skf      Patient Care Team: Geoffery Lyons, MD as PCP - General (Internal Medicine)   HPI  [name] is a 78 y.o. male Seen as an add-on for atrial fibrillation as his QTC intervals precluding of anticipated use of dofetilide. He has had a couple of episodes of atrial fibrillation which are evident by nausea and dizziness. He required cardioversions August and January.  Past Medical History  Diagnosis Date  . Coronary artery disease     First MI in 1988. PCI in 1996 with subsequent CABG in 1996 due to restenosis. S/P stents to LCX in 2009. Noted to have residual disease in the LAD in a diffuse manner and atretic LIMA graft. He is managed medically.   [name] History of Raynaud's syndrome   . 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 GERD (gastroesophageal reflux disease)   . Hyperlipidemia   . PVC (premature ventricular contraction)   . 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 Scoliosis     mild  . Abnormal nuclear cardiac imaging test March 2011    Has positive EKG response, no perfusion defect and normal EF  . Internal hemorrhoids   . Adenomatous colon polyp 12/2002    Past Surgical History  Procedure Laterality Date  . Coronary artery bypass graft  1996    with a LIMA to the LAD, SVG to RCA and SVG to OM  . Coronary stent placement  2009     LCX  . Appendectomy      Age 54  . Colonoscopy  2008  . Tonsillectomy    . Cardioversion N/A 08/22/2013    Procedure: CARDIOVERSION;  Surgeon: Carlena Bjornstad, MD;  Location: [facility] ENDOSCOPY;  Service: Cardiovascular;  Laterality: N/A;  . Cardioversion N/A 01/16/2014    Procedure: CARDIOVERSION;  Surgeon: Lelon Perla, MD;  Location: [facility] ENDOSCOPY;  Service: Cardiovascular;  Laterality: N/A;    Current Outpatient Prescriptions  Medication Sig Dispense Refill  . acetaminophen (TYLENOL) 325 MG tablet Take 325 mg by mouth every 4 (four) hours as needed for pain.      . B Complex Vitamins (VITAMIN-B COMPLEX  PO) Take by mouth daily.        [name] BYSTOLIC 10 MG tablet TAKE 1 TABLET DAILY  30 tablet  6  . Cholecalciferol (VITAMIN D PO) Take by mouth as directed. Taking 50,000 2 times per month      . lactobacillus acidophilus & bulgar (LACTINEX) chewable tablet Chew 2 tablets by mouth daily.       [name] LORazepam (ATIVAN) 0.5 MG tablet Take 0.5 mg by mouth at bedtime.       [name] LYRICA 50 MG capsule Take 50 mg by mouth daily. Slowly decreasing dosage      . magnesium oxide (MAG-OX) 400 MG tablet Take 400 mg by mouth daily.        [name] RANEXA 1000 MG SR tablet TAKE 1 TABLET BY MOUTH TWICE A DAY  180 tablet  3  . ranitidine (ZANTAC) 300 MG tablet TAKE 1 TABLET BY MOUTH EVERY DAY  30 tablet  3  . Rivaroxaban (XARELTO) 20 MG TABS tablet Take 1 tablet (20 mg total) by mouth daily.  30 tablet  6  . rosuvastatin (CRESTOR) 40 MG tablet Take 40 mg by mouth daily.        . Testosterone (TESTODERM TD) Place 1 application onto the skin daily. 10% Testosterone  No current facility-administered medications for this visit.    Allergies  Allergen Reactions  . Morphine And Related Other (See Comments)    IV forms- vein irritation    Review of Systems negative except from HPI and PMH  Physical Exam BP 148/76  Pulse 70  Ht 5\' 6"  (1.676 m)  Wt 136 lb (61.689 kg)  BMI 21.96 kg/m2 Well developed and nourished in no acute distress HENT normal Neck supple with JVP-flat Clear Regular rate and rhythm, no murmurs or gallops Abd-soft with active BS No Clubbing cyanosis edema Skin-warm and dry A & Oriented  Grossly normal sensory and motor function  ECG demonstrates sinus rhythm with a QTC of 460  Assessment and  Plan

## 2014-01-23 NOTE — Assessment & Plan Note (Signed)
Patient is having recurrent t paroxysms of atrial fibrillation. His coronary disease precludes a 1C agent.  His QTC preclude the use of sotalol and/or dofetilide. Given the relatively infrequent of events, 5 month intervals, we discussed as an option follow in the frequency and holding off on drug therapy until the frequency shortness is sufficiently that cardioversion to require every 3 or 4 months. We discussed amiodarone versus dronaderone with the risks and benefits.  Ultimately decided to watch expectantly. We also discussed for arrhythmia associated with dofetilide and why his QTC was precluding.  He is on Rivaroxaban given his prior stroke

## 2014-01-24 DIAGNOSIS — Z1212 Encounter for screening for malignant neoplasm of rectum: Secondary | ICD-10-CM | POA: Diagnosis not present

## 2014-01-24 NOTE — Assessment & Plan Note (Signed)
Discussed pt with Dr. Caryl Comes and [name].  Given prolongated QTc, pt not a candidate for Tikosyn at this time.  Dr. Caryl Comes agreed to see pt today and discuss options.

## 2014-01-24 NOTE — Progress Notes (Signed)
  History of Present Illness: [name] is a 78 yo M pt of [name] He has known CAD s/p CABG, GERD, recent herpes zoster with post herpetic neuralgia, post polio syndrome, PVCs, HLD, and prior brainstem stroke with residual horner syndrome in 1996 following a cath and polyps. He was diagnosed with atrial fib back in July - was placed on Xarelto and was cardioverted back to sinus rhythm. Since that time, he has had a couple of episodes of acute onset of dizziness with N/V.  He was seen by PCP last week and MRI showed no acute changes so he was sent to cardiology to see if it may be related to his atrial fibrillation.  Truitt Merle, NP evaluated pt on 1/20.  At that time it was decided to proceed with DCCV and then start antiarrhythmic therapy.  Pt was cardioverted on 1/21 back into normal rhythm.  He does not report any episodes of dizziness or N/V since that time.  He is here today to discuss Tikosyn initation.    He is accompanied with his wife.  Reviewed pt's medication list.  He stopped his trazadone on Sunday 1/25.  He continues on Ranexa, which may cause an increase in QTc as well.  No other contraindicated or QTc prolongating medications noted.  He has not tried any other antiarrhythmics in the past.  He states he has been compliant with Xarelto for the past 30 days.   EKG reviewed by Dr. Caryl Comes.   Sinus rhythm with vent rate of 70 and Qtc 464.    Current Outpatient Prescriptions  Medication Sig Dispense Refill  . acetaminophen (TYLENOL) 325 MG tablet Take 325 mg by mouth every 4 (four) hours as needed for pain.      . B Complex Vitamins (VITAMIN-B COMPLEX PO) Take by mouth daily.        [name] BYSTOLIC 10 MG tablet TAKE 1 TABLET DAILY  30 tablet  6  . Cholecalciferol (VITAMIN D PO) Take by mouth as directed. Taking 50,000 2 times per month      . lactobacillus acidophilus & bulgar (LACTINEX) chewable tablet Chew 2 tablets by mouth daily.       [name] LORazepam (ATIVAN) 0.5 MG tablet Take 0.5 mg by  mouth at bedtime.       [name] LYRICA 50 MG capsule Take 50 mg by mouth daily. Slowly decreasing dosage      . magnesium oxide (MAG-OX) 400 MG tablet Take 400 mg by mouth daily.        [name] RANEXA 1000 MG SR tablet TAKE 1 TABLET BY MOUTH TWICE A DAY  180 tablet  3  . ranitidine (ZANTAC) 300 MG tablet TAKE 1 TABLET BY MOUTH EVERY DAY  30 tablet  3  . Rivaroxaban (XARELTO) 20 MG TABS tablet Take 1 tablet (20 mg total) by mouth daily.  30 tablet  6  . rosuvastatin (CRESTOR) 40 MG tablet Take 40 mg by mouth daily.        . Testosterone (TESTODERM TD) Place 1 application onto the skin daily. 10% Testosterone        No current facility-administered medications for this visit.    Allergies  Allergen Reactions  . Morphine And Related Other (See Comments)    IV forms- vein irritation

## 2014-02-13 DIAGNOSIS — B0052 Herpesviral keratitis: Secondary | ICD-10-CM | POA: Diagnosis not present

## 2014-03-06 ENCOUNTER — Encounter: Payer: Self-pay | Admitting: Cardiology

## 2014-03-06 ENCOUNTER — Ambulatory Visit (INDEPENDENT_AMBULATORY_CARE_PROVIDER_SITE_OTHER): Payer: Medicare Other | Admitting: Cardiology

## 2014-03-06 VITALS — BP 122/80 | HR 79 | Ht 66.0 in | Wt 139.0 lb

## 2014-03-06 DIAGNOSIS — I4891 Unspecified atrial fibrillation: Secondary | ICD-10-CM | POA: Diagnosis not present

## 2014-03-06 DIAGNOSIS — R0602 Shortness of breath: Secondary | ICD-10-CM | POA: Diagnosis not present

## 2014-03-06 DIAGNOSIS — I2510 Atherosclerotic heart disease of native coronary artery without angina pectoris: Secondary | ICD-10-CM | POA: Diagnosis not present

## 2014-03-06 DIAGNOSIS — E785 Hyperlipidemia, unspecified: Secondary | ICD-10-CM

## 2014-03-06 MED ORDER — FUROSEMIDE 20 MG PO TABS: 20.0000 mg | ORAL_TABLET | ORAL | Status: DC | PRN

## 2014-03-06 NOTE — Patient Instructions (Signed)
Your physician recommends that you continue on your current medications as directed. Please refer to the Current Medication list given to you today.  Your physician recommends that you go to the lab today for a BMET, CBC, and BNP  You have been referred to Dr Rayann Heman for possible AFIB Ablation (schedule ASAP per Dr Aundra Dubin)  Your physician recommends that you schedule a follow-up appointment in: 2 Months with Dr Aundra Dubin

## 2014-03-07 LAB — BASIC METABOLIC PANEL
BUN: 22 mg/dL (ref 6–23)
CO2: 29 mEq/L (ref 19–32)
Calcium: 9.7 mg/dL (ref 8.4–10.5)
Chloride: 103 mEq/L (ref 96–112)
Creatinine, Ser: 1.3 mg/dL (ref 0.4–1.5)
GFR: 57.08 mL/min — AB (ref >60.00)
Glucose, Bld: 81 mg/dL (ref 70–99)
Potassium: 4.1 mEq/L (ref 3.5–5.1)
SODIUM: 139 meq/L (ref 135–145)

## 2014-03-07 LAB — CBC
HCT: 42.2 % (ref 39.0–52.0)
Hemoglobin: 14 g/dL (ref 13.0–17.0)
MCHC: 33.3 g/dL (ref 30.0–36.0)
MCV: 99.9 fl (ref 78.0–100.0)
Platelets: 166 10*3/uL (ref 150.0–400.0)
RBC: 4.22 Mil/uL (ref 4.22–5.81)
RDW: 15.3 % — ABNORMAL HIGH (ref 11.5–14.6)
WBC: 5.9 10*3/uL (ref 4.5–10.5)

## 2014-03-07 LAB — BRAIN NATRIURETIC PEPTIDE: Pro B Natriuretic peptide (BNP): 237 pg/mL — ABNORMAL HIGH (ref 0.0–100.0)

## 2014-03-07 NOTE — Progress Notes (Signed)
Patient ID: [name], male   DOB: [birth year], 78 y.o.   MRN: [id number] PCP: [name]  78 yo with history of CAD s/p CABG presents for cardiology followup.  His last cath was in 11/09.  The SVG-distal RCA was patent, the CFX system was patent.  His LIMA was atretic and there were serial 60% and 80% stenoses in the native LAD.  He was managed medically.  Echo in 8/14 showed EF 55-60% with moderate MR and moderate TR.   He was initially noted to have atrial fibrillation in the summer of 2014. He was started on Xarelto and cardioverted to NSR in 8/14.  Recurrent atrial fibrillation was noted in 1/15, and he was cardioverted to NSR again.  This time, NSR did not hold long.  He is currently in atrial fibrillation.  We decided not to start Tikosyn due to prolonged QT interval (and he is on ranolazine chronically).  He does not feel palpitations, but he clearly feels worse when in atrial fibrillation.  He is more fatigued.  He is short of breath walking up hills or up stairs.  No orthopnea/PND.  No chest pain.  His ankles have been swelling some, and he has been taking Lasix about twice a week.   ECG: Atrial fibrillation at 79 with nonspecific T wave flattening.     Labs (8/12): K 4.1, creatinine 1.2, LDL 91, HDL 53 Labs (8/14): K 4.6, creatinine 1.1 Labs (11/14): K 4.6, creatinine 1.2, LDL 91, HDL 56  PMH: 1. CAD: 1st MI in 1988.  CABG 1996.  PCI to CFX in 2009.  LHC (11/09) SVG-dRCA patent, total occlusion RCA, patent CFX stent, atretic LIMA, serial 60 and 80% proximal LAD stenoses, EF 60% with basal inferior hypokinesis.  Myoview in 2011 with no ischemia or infarction.  Echo (10/12) with EF 55-60%, mild LVH, mild MR.  Echo (8/14) with EF 55-60%, moderate MR, moderate TR, PA systolic pressure 35 mmHg.  2. Raynauds 3. Post-polio syndrome 4. GERD with dilation of esophageal stricture in 12/12.  5. Hyperlipidemia 6. Brainstem stroke with Horner's syndrome.  7. Scoliosis. 8. H/o appendectomy 9.  Herpes Zoster 10. Atrial fibrillation: DCCV to NSR in 8/14. DCCV to NSR in 1/15.   SH: quit tobacco while in his 23s.  Married.  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   FH: Brother with CAD, heart transplant.   ROS: All systems reviewed and negative except as per HPI.   Current Outpatient Prescriptions  Medication Sig Dispense Refill  . acetaminophen (TYLENOL) 325 MG tablet Take 325 mg by mouth every 4 (four) hours as needed for pain.      . B Complex Vitamins (VITAMIN-B COMPLEX PO) Take by mouth daily.        [name] BYSTOLIC 10 MG tablet TAKE 1 TABLET DAILY  30 tablet  6  . Cholecalciferol (VITAMIN D PO) Take by mouth as directed. Taking 50,000 2 times per month      . furosemide (LASIX) 20 MG tablet Take 1 tablet (20 mg total) by mouth as needed.  15 tablet  3  . lactobacillus acidophilus & bulgar (LACTINEX) chewable tablet Chew 2 tablets by mouth daily.       [name] LORazepam (ATIVAN) 0.5 MG tablet Take 0.5 mg by mouth at bedtime.       [name] LYRICA 50 MG capsule Take 50 mg by mouth daily. Slowly decreasing dosage      . magnesium oxide (MAG-OX) 400 MG tablet Take 400 mg by mouth daily.        [name]  RANEXA 1000 MG SR tablet TAKE 1 TABLET BY MOUTH TWICE A DAY  180 tablet  3  . ranitidine (ZANTAC) 300 MG tablet TAKE 1 TABLET BY MOUTH EVERY DAY  30 tablet  3  . Rivaroxaban (XARELTO) 20 MG TABS tablet Take 1 tablet (20 mg total) by mouth daily.  30 tablet  6  . rosuvastatin (CRESTOR) 40 MG tablet Take 40 mg by mouth daily.        . Testosterone (TESTODERM TD) Place 1 application onto the skin daily. 10% Testosterone       . traZODone (DESYREL) 50 MG tablet Take 50 mg by mouth at bedtime.       No current facility-administered medications for this visit.    BP 130/68  Pulse 66  Ht 5\' 6"  (1.676 m)  Wt 60.328 kg (133 lb)  BMI 21.47 kg/m2 General: NAD Neck: No JVD, no thyromegaly or thyroid nodule.  Lungs: Clear to auscultation bilaterally with normal respiratory effort. CV: Nondisplaced PMI.  Heart  regular S1/S2, no S3/S4, no murmur.  Trace ankle edema.  No carotid bruit.  Normal pedal pulses.  Abdomen: Soft, nontender, no hepatosplenomegaly, no distention.  Neurologic: Alert and oriented x 3.  Psych: Normal affect.  Assessment/Plan: 1. Atrial fibrillation: He is back in atrial fibrillation again after repeat cardioversion in 1/15.  He clearly feels worse in atrial fibrillation than in NSR, even with rate controlled.  He would need antiarrhythmic to hold NSR, it looks like.  He is not a Ic candidate due to coronary disease.  Sotalol and dofetilide would be difficult due to prolonged baseline QTc (he is also on Ranexa).  He is concerned about the side effects of amiodarone.  I actually think that he would be a good atrial fibrillation ablation candidate.  I am going to refer him to Dr Rayann Heman, hopefully for an appointment soon. If he does not get an ablation, would aim towards amiodarone + cardioversion.  Continue nebivolol and Xarelto.  Check CBC and BMET today.  2. CAD: Stable with no ischemic symptoms.  He is not taking aspirin as he has stable CAD and is on Xarelto.  Continue nebivolol and Crestor.  3. Hyperlipidemia: Lipids acceptable on high-dose Crestor. 4. Chronic diastolic CHF: Probably mild volume retention now that he is in atrial fibrillation.  He controls with Lasix about twice a week. This is reasonable to continue.  NYHA class II-III symptoms. Will check BNP today.    [name] 03/07/2014

## 2014-03-25 ENCOUNTER — Encounter: Payer: Self-pay | Admitting: Internal Medicine

## 2014-03-25 ENCOUNTER — Ambulatory Visit (INDEPENDENT_AMBULATORY_CARE_PROVIDER_SITE_OTHER): Payer: Medicare Other | Admitting: Internal Medicine

## 2014-03-25 VITALS — BP 136/62 | HR 78 | Ht 66.0 in | Wt 137.0 lb

## 2014-03-25 DIAGNOSIS — I4891 Unspecified atrial fibrillation: Secondary | ICD-10-CM | POA: Diagnosis not present

## 2014-03-25 LAB — HEPATIC FUNCTION PANEL
ALK PHOS: 66 U/L (ref 39–117)
ALT: 32 U/L (ref 0–53)
AST: 38 U/L — AB (ref 0–37)
Albumin: 4.2 g/dL (ref 3.5–5.2)
BILIRUBIN TOTAL: 1.4 mg/dL — AB (ref 0.3–1.2)
Bilirubin, Direct: 0.2 mg/dL (ref 0.0–0.3)
Total Protein: 7.2 g/dL (ref 6.0–8.3)

## 2014-03-25 LAB — TSH: TSH: 1.62 u[IU]/mL (ref 0.35–5.50)

## 2014-03-25 MED ORDER — AMIODARONE HCL 200 MG PO TABS: 200.0000 mg | ORAL_TABLET | Freq: Two times a day (BID) | ORAL | Status: DC

## 2014-03-25 NOTE — Patient Instructions (Addendum)
Your physician recommends that you schedule a follow-up appointment as needed with Dr Rayann Heman, 4 weeks for an EKG if still out of rhythm will need to have Cardioversion with Dr Aundra Dubin     Your physician recommends that you return for lab work today TSH/LFT's  Your physician has requested that you have an echocardiogram. Echocardiography is a painless test that uses sound waves to create images of your heart. It provides your doctor with information about the size and shape of your heart and how well your heart's chambers and valves are working. This procedure takes approximately one hour. There are no restrictions for this procedure.   Your physician has recommended that you have a pulmonary function test. Pulmonary Function Tests are a group of tests that measure how well air moves in and out of your lungs.  Your physician has recommended you make the following change in your medication:  1) Start Amiodarone 200mg  twice daily

## 2014-03-25 NOTE — Progress Notes (Signed)
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  He was initially noted to have atrial fibrillation in the summer of 2014. In retrospect, he thinks that he has had afib for several years.  He was started on Xarelto and cardioverted to NSR in 8/14.  He reports improvement in exercise tolerance with sinus rhythm.  Unfortunately he returned to afib within several months.  He underwent repeat cardioversion 1/15 but did not maintain sinus rhythm.  He has been in afib since that time.  He has not tried AAD therapy.  With afib, he reports fatigue and decreased exercise tolerance.  Today, he denies symptoms of palpitations, chest pain,  lower extremity edema, dizziness, presyncope, syncope, or neurologic sequela. The patient is tolerating medications without difficulties and is otherwise without complaint today.   Past Medical History  Diagnosis Date  . Coronary artery disease     First MI in 1988. PCI in 1996 with subsequent CABG in 1996 due to restenosis. S/P stents to LCX in 2009. Noted to have residual disease in the LAD in a diffuse manner and atretic LIMA graft. He is managed medically.   [name] History of Raynaud's syndrome   . 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 GERD (gastroesophageal reflux disease)   . Hyperlipidemia   . PVC (premature ventricular contraction)   . 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 Scoliosis     mild  . Abnormal nuclear cardiac imaging test March 2011    Has positive EKG response, no perfusion defect and normal EF  . Internal hemorrhoids   . Adenomatous colon polyp 12/2002  . Persistent atrial fibrillation    Past Surgical History  Procedure Laterality Date  . Coronary artery bypass graft  1996    with a LIMA to the LAD, SVG to RCA and SVG to OM  . Coronary stent placement  2009     LCX  .  Appendectomy      Age 51  . Colonoscopy  2008  . Tonsillectomy    . Cardioversion N/A 08/22/2013    Procedure: CARDIOVERSION;  Surgeon: Jimmy Bjornstad, MD;  Location: [facility] ENDOSCOPY;  Service: Cardiovascular;  Laterality: N/A;  . Cardioversion N/A 01/16/2014    Procedure: CARDIOVERSION;  Surgeon: Jimmy Perla, MD;  Location: [facility] ENDOSCOPY;  Service: Cardiovascular;  Laterality: N/A;    Current Outpatient Prescriptions  Medication Sig Dispense Refill  . acetaminophen (TYLENOL) 325 MG tablet Take 325 mg by mouth every 4 (four) hours as needed for pain.      . B Complex Vitamins (VITAMIN-B COMPLEX PO) Take 1 capsule by mouth daily.       [name] BYSTOLIC 10 MG tablet TAKE 1 TABLET DAILY  30 tablet  6  . Cholecalciferol (VITAMIN D PO) Take by mouth as directed. Taking 50,000 2 times per month      . furosemide (LASIX) 20 MG tablet Take 1 tablet (20 mg total) by mouth as needed.  15 tablet  3  . lactobacillus acidophilus & bulgar (LACTINEX) chewable tablet Chew 2 tablets by mouth daily.       [name] LORazepam (ATIVAN) 0.5 MG tablet Take 0.5 mg by mouth at bedtime.       [name] LYRICA 50 MG capsule Take 50 mg by mouth daily. Slowly decreasing dosage      .  magnesium oxide (MAG-OX) 400 MG tablet Take 400 mg by mouth daily.        [name] RANEXA 1000 MG SR tablet TAKE 1 TABLET BY MOUTH TWICE A DAY  180 tablet  3  . ranitidine (ZANTAC) 300 MG tablet TAKE 1 TABLET BY MOUTH EVERY DAY  30 tablet  3  . Rivaroxaban (XARELTO) 20 MG TABS tablet Take 1 tablet (20 mg total) by mouth daily.  30 tablet  6  . rosuvastatin (CRESTOR) 40 MG tablet Take 40 mg by mouth daily.        . Testosterone (TESTODERM TD) Place 1 application onto the skin daily. 10% Testosterone       . traZODone (DESYREL) 50 MG tablet Take 50 mg by mouth at bedtime.      [name] amiodarone (PACERONE) 200 MG tablet Take 1 tablet (200 mg total) by mouth 2 (two) times daily.  180 tablet  3   No current facility-administered medications for this visit.    Allergies    Allergen Reactions  . Morphine And Related Other (See Comments)    IV forms- vein irritation    History   Social History  . Marital Status: Married    Spouse Name: N/A    Number of Children: 2  . Years of Education: N/A   Occupational History  . Pharmacist    Social History Main Topics  . Smoking status: Former Research scientist (life sciences)  . Smokeless tobacco: Never Used  . Alcohol Use: No  . Drug Use: No  . Sexual Activity: Yes   Other Topics Concern  . 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 Heart disease Mother   . Heart disease Father   . Other Brother     brother had heart transplant  . Colon cancer Neg Hx     ROS- All systems are reviewed and negative except as per the HPI above  Physical Exam: Filed Vitals:   03/25/14 0849  BP: 136/62  Pulse: 78  Height: 5\' 6"  (1.676 m)  Weight: 137 lb (62.143 kg)    GEN- The patient is well appearing, alert and oriented x 3 today.   Head- normocephalic, atraumatic Eyes-  Sclera clear, conjunctiva pink Ears- hearing intact Oropharynx- clear Neck- supple, no JVP Lymph- no cervical lymphadenopathy Lungs- Clear to ausculation bilaterally, normal work of breathing Heart- irregular rate and rhythm, no murmurs, rubs or gallops, PMI not laterally displaced GI- soft, NT, ND, + BS Extremities- no clubbing, cyanosis, or edema MS- no significant deformity or atrophy Skin- no rash or lesion Psych- euthymic mood, full affect Neuro- strength and sensation are intact  EKG today reveals afib, V rate 83 bpm, nonspecific ST/T changes Echo 8/14- EF 55-60%, mild LVH, moderate MR, moderate TR, LA 39mm Epic notes are reviewed  Assessment and Plan:  1. persistent afib The patient has symptomatic persistent afib.  He is symptomatic.  He has not tried AAD though his options are limited.  Given CAD, he is not a  candidate for Ic drugs.  Given ranexa (which he reports has significantly improved his ischemic symptoms) he is not a good candidate for sotalol/ tikosyn.  This really only leaves amiodarone as an option.  Given reduced success rates with ablation (50-60%) with persistent afib, guidelines are clear that we should try AAD therapy prior  to ablation.   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  He is aware of the risks of this medicine including liver,lung, thyroid toxicity and wishes to proceed. I will start amiodarone 200mg  BID at this time.  He will return in 4 weeks for an ekg. If he remains in afib then he should undergo cardioversion at that time.  He should then reduce dose of amiodarone to 200mg  daily.  He has scheduled follow-up in may with Dr Aundra Dubin. We will order LFTs/TFTs/PFTs today If he fails medical therapy with amiodarone then we may consider ablation.  If he maintainis sinus with amiodarone then hopefully his dose may eventually decrease to 100mg  daily. I will see as needed going forward. He is appropriately anticoagulated with xarelto.

## 2014-03-27 ENCOUNTER — Ambulatory Visit (HOSPITAL_COMMUNITY)
Admission: RE | Admit: 2014-03-27 | Discharge: 2014-03-27 | Disposition: A | Discharge status: Home / Self Care | Payer: Medicare Other | Source: Ambulatory Visit | Attending: Cardiovascular Disease | Admitting: Cardiovascular Disease

## 2014-03-27 DIAGNOSIS — I059 Rheumatic mitral valve disease, unspecified: Secondary | ICD-10-CM

## 2014-03-27 DIAGNOSIS — I4891 Unspecified atrial fibrillation: Secondary | ICD-10-CM | POA: Insufficient documentation

## 2014-03-27 NOTE — Progress Notes (Signed)
  Echocardiogram 2D Echocardiogram has been performed.  [place], [facility] 03/27/2014, 9:28 AM

## 2014-04-02 ENCOUNTER — Ambulatory Visit (INDEPENDENT_AMBULATORY_CARE_PROVIDER_SITE_OTHER): Payer: Medicare Other | Admitting: Internal Medicine

## 2014-04-02 DIAGNOSIS — I4891 Unspecified atrial fibrillation: Secondary | ICD-10-CM | POA: Diagnosis not present

## 2014-04-02 DIAGNOSIS — H538 Other visual disturbances: Secondary | ICD-10-CM | POA: Diagnosis not present

## 2014-04-02 LAB — PULMONARY FUNCTION TEST
DL/VA % PRED: 102 %
DL/VA: 4.42 ml/min/mmHg/L
DLCO unc % pred: 53 %
DLCO unc: 14.36 ml/min/mmHg
FEF 25-75 POST: 1.11 L/s
FEF 25-75 Pre: 0.74 L/sec
FEF2575-%Change-Post: 50 %
FEF2575-%PRED-POST: 61 %
FEF2575-%PRED-PRE: 40 %
FEV1-%Change-Post: 12 %
FEV1-%PRED-POST: 61 %
FEV1-%PRED-PRE: 54 %
FEV1-POST: 1.57 L
FEV1-PRE: 1.39 L
FEV1FVC-%CHANGE-POST: 14 %
FEV1FVC-%PRED-PRE: 91 %
FEV6-%Change-Post: 0 %
FEV6-%PRED-PRE: 63 %
FEV6-%Pred-Post: 62 %
FEV6-Post: 2.07 L
FEV6-Pre: 2.08 L
FEV6FVC-%Change-Post: 0 %
FEV6FVC-%PRED-POST: 107 %
FEV6FVC-%Pred-Pre: 106 %
FVC-%Change-Post: -1 %
FVC-%PRED-POST: 58 %
FVC-%Pred-Pre: 59 %
FVC-Post: 2.07 L
FVC-Pre: 2.1 L
POST FEV1/FVC RATIO: 76 %
PRE FEV1/FVC RATIO: 66 %
PRE FEV6/FVC RATIO: 99 %
Post FEV6/FVC ratio: 100 %
RV % pred: 79 %
RV: 1.87 L
TLC % pred: 61 %
TLC: 3.85 L

## 2014-04-02 NOTE — Progress Notes (Signed)
PFT done today. 

## 2014-04-08 ENCOUNTER — Encounter: Payer: Self-pay | Admitting: Nurse Practitioner

## 2014-04-08 ENCOUNTER — Ambulatory Visit (INDEPENDENT_AMBULATORY_CARE_PROVIDER_SITE_OTHER): Payer: Medicare Other | Admitting: Nurse Practitioner

## 2014-04-08 VITALS — BP 120/70 | HR 66 | Ht 66.0 in | Wt 137.4 lb

## 2014-04-08 DIAGNOSIS — R942 Abnormal results of pulmonary function studies: Secondary | ICD-10-CM | POA: Diagnosis not present

## 2014-04-08 DIAGNOSIS — I2510 Atherosclerotic heart disease of native coronary artery without angina pectoris: Secondary | ICD-10-CM | POA: Diagnosis not present

## 2014-04-08 DIAGNOSIS — R0602 Shortness of breath: Secondary | ICD-10-CM | POA: Diagnosis not present

## 2014-04-08 DIAGNOSIS — I4891 Unspecified atrial fibrillation: Secondary | ICD-10-CM

## 2014-04-08 NOTE — Progress Notes (Signed)
[name] Date of Birth: 1936-10-30 Medical Record #[id number]  History of Present Illness: [name] is seen back today for a "second opinion" visit. Seen for [name]. Has also seen Dr. Rayann Heman and Caryl Comes. Former patient of Dr. [name]. He has known CAD with MI dating back to 1988, PCI in 1996 with subsequent past CABG in 1996 due to restenosis. Stents to the LCX in 2009. Last cath dates back to 2009. Had a SVG to the distal RCA patent and the CFX system was patent. LIMA was atretic and there were serial 60 to 80% stenoses in the native LAD. He was managed medically. He is on chronic Ranexa.   Other issues include GERD, recent herpes zoster with post herpetic neuralgia, post polio syndrome, PVCs, HLD, prior brainstem stroke with residual horner syndrome in 1996 following a cath and polyps.   Did have atrial fib back in July - was placed on Xarelto and was cardioverted back to sinus rhythm. Echo showed normal EF with mild biatrial enlargement.   Had recurrent atrial fib in January of 2015 - did not maintain sinus.  Saw Dr. Caryl Comes in January with the following recommended: "His coronary disease precludes a 1C agent. His QTC preclude the use of sotalol and/or dofetilide. Given the relatively infrequent of events, 5 month intervals, we discussed as an option follow in the frequency and holding off on drug therapy until the frequency shortness is sufficiently that cardioversion to require every 3 or 4 months. We discussed amiodarone versus dronaderone with the risks and benefits. Ultimately decided to watch expectantly. We also discussed for arrhythmia associated with dofetilide and why his QTC was precluding.  Then saw [name] in March. Was back in atrial fib - symptomatic with fatigue and shortness of breath. Was not dizzy or having nauseated spells. Good rate control but was symptomatic. He referred him to Dr. Rayann Heman for consideration of an ablation but also mentioned possible amiodarone with  cardioversion.   Saw Dr. Rayann Heman at the end of March. Discussed ablation - noted reduced success rates with ablation 50 to 60% and opted to start amiodarone therapy. If he failed AAD - then would consider ablation.   Comes in today. Here with his wife. Here to discuss his options. He has not started his amiodarone. He is very reluctant. Admits that he "knows just enough to be dangerous". Had an echo and PFTs - asking my opinion about what he should do. His primary complaint is that of shortness of breath with exertion. No chest pain. Not dizzy. Does not really seem all that symptomatic with his atrial fib.    Current Outpatient Prescriptions  Medication Sig Dispense Refill  . acetaminophen (TYLENOL) 325 MG tablet Take 325 mg by mouth every 4 (four) hours as needed for pain.      . B Complex Vitamins (VITAMIN-B COMPLEX PO) Take 1 capsule by mouth daily.       [name] BYSTOLIC 10 MG tablet TAKE 1 TABLET DAILY  30 tablet  6  . Cholecalciferol (VITAMIN D PO) Take by mouth as directed. Taking 50,000 2 times per month      . furosemide (LASIX) 20 MG tablet Take 1 tablet (20 mg total) by mouth as needed.  15 tablet  3  . lactobacillus acidophilus & bulgar (LACTINEX) chewable tablet Chew 2 tablets by mouth daily.       [name] LORazepam (ATIVAN) 0.5 MG tablet Take 0.5 mg by mouth at bedtime.       [name]  LYRICA 50 MG capsule Take 50 mg by mouth daily. Slowly decreasing dosage      . magnesium oxide (MAG-OX) 400 MG tablet Take 400 mg by mouth daily.        [name] PROLENSA 0.07 % SOLN as needed.       [name] RANEXA 1000 MG SR tablet TAKE 1 TABLET BY MOUTH TWICE A DAY  180 tablet  3  . ranitidine (ZANTAC) 300 MG tablet TAKE 1 TABLET BY MOUTH EVERY DAY  30 tablet  3  . Rivaroxaban (XARELTO) 20 MG TABS tablet Take 1 tablet (20 mg total) by mouth daily.  30 tablet  6  . rosuvastatin (CRESTOR) 40 MG tablet Take 40 mg by mouth daily.        . Testosterone (TESTODERM TD) Place 1 application onto the skin daily. 10% Testosterone       .  traZODone (DESYREL) 50 MG tablet Take 50 mg by mouth at bedtime.       No current facility-administered medications for this visit.    Allergies  Allergen Reactions  . Morphine And Related Other (See Comments)    IV forms- vein irritation    Past Medical History  Diagnosis Date  . Coronary artery disease     First MI in 1988. PCI in 1996 with subsequent CABG in 1996 due to restenosis. S/P stents to LCX in 2009. Noted to have residual disease in the LAD in a diffuse manner and atretic LIMA graft. He is managed medically.   [name] History of Raynaud's syndrome   . 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 GERD (gastroesophageal reflux disease)   . Hyperlipidemia   . PVC (premature ventricular contraction)   . 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 Scoliosis     mild  . Abnormal nuclear cardiac imaging test March 2011    Has positive EKG response, no perfusion defect and normal EF  . Internal hemorrhoids   . Adenomatous colon polyp 12/2002  . Persistent atrial fibrillation     Past Surgical History  Procedure Laterality Date  . Coronary artery bypass graft  1996    with a LIMA to the LAD, SVG to RCA and SVG to OM  . Coronary stent placement  2009     LCX  . Appendectomy      Age 33  . Colonoscopy  2008  . Tonsillectomy    . Cardioversion N/A 08/22/2013    Procedure: CARDIOVERSION;  Surgeon: Carlena Bjornstad, MD;  Location: [facility] ENDOSCOPY;  Service: Cardiovascular;  Laterality: N/A;  . Cardioversion N/A 01/16/2014    Procedure: CARDIOVERSION;  Surgeon: Lelon Perla, MD;  Location: [facility] ENDOSCOPY;  Service: Cardiovascular;  Laterality: N/A;    History  Smoking status  . Former Smoker  Smokeless tobacco  . Never Used    History  Alcohol Use No    Family History  Problem Relation Age of Onset  . Heart disease Mother   . Heart disease Father   . Other Brother     brother had heart transplant  . Colon cancer Neg Hx     Review of Systems: The  review of systems is per the HPI.  All other systems were reviewed and are negative.  Physical Exam: BP 120/70  Pulse 66  Ht 5\' 6"  (1.676 m)  Wt 137 lb 6.4 oz (62.324 kg)  BMI 22.19 kg/m2  SpO2 100% Patient is very pleasant and in  no acute distress. He is thin. Skin is warm and dry. Color is normal.  HEENT is unremarkable. Normocephalic/atraumatic. PERRL. Sclera are nonicteric. Neck is supple. No masses. No JVD. Lungs are clear but does have some wheezing in the right lower lung. Cardiac exam shows an irregular rhythm. Rate is ok. Abdomen is soft. Extremities are without edema. Gait and ROM are intact. No gross neurologic deficits noted.  LABORATORY DATA:  Lab Results  Component Value Date   WBC 5.9 03/06/2014   HGB 14.0 03/06/2014   HCT 42.2 03/06/2014   PLT 166.0 03/06/2014   GLUCOSE 81 03/06/2014   CHOL 162 11/09/2013   TRIG 74.0 11/09/2013   HDL 56.00 11/09/2013   LDLCALC 91 11/09/2013   ALT 32 03/25/2014   AST 38* 03/25/2014   NA 139 03/06/2014   K 4.1 03/06/2014   CL 103 03/06/2014   CREATININE 1.3 03/06/2014   BUN 22 03/06/2014   CO2 29 03/06/2014   TSH 1.62 03/25/2014   INR 2.6* 01/15/2014    Echo Study Conclusions from April 2015  - Left ventricle: The cavity size was normal. There was mild focal basal hypertrophy of the septum. Systolic function was normal. The estimated ejection fraction was in the range of 60% to 65%. There is akinesis of the basalinferior myocardium. - Mitral valve: Calcified annulus. Mildly thickened leaflets . Mild regurgitation. - Left atrium: The atrium was moderately to severely dilated. - Right atrium: The atrium was mildly dilated.    Assessment / Plan: 1. Atrial fib - rate is controlled. He is on Xarelto for stroke prevention. Atrial size are increased. Goal to keep in sinus long term felt to be difficult given the enlargement of the atrium. He is not wanting to start amiodarone at this time.   2. Dyspnea - has a moderate/severe obstruction  and diffusion defect on his PFTs - will send to pulmonary for evaluation. This may be more of the issue as to why he is not doing well clinically.   3. CAD - no active chest pain.  4. Chronic anticoagulation - on Xarelto  He is wanting to hold off on any further treatment for the AF - may consider referral out of town but will proceed with pulmonary referral first.   Patient is agreeable to this plan and will call if any problems develop in the interim.   Burtis Junes, RN, [place] [address] [place], [place]  [phone]

## 2014-04-08 NOTE — Patient Instructions (Addendum)
Arrange for pulmonary consult with Dr. Joya Gaskins or Dr. Lamonte Sakai only  Cancel EKG visit  See [name] back as planned  If you want a referral out of town to EP - just let me know  Call the [place] office at [phone] if you have any questions, problems or concerns.

## 2014-04-22 ENCOUNTER — Other Ambulatory Visit: Payer: Self-pay | Admitting: Cardiology

## 2014-04-22 DIAGNOSIS — H18419 Arcus senilis, unspecified eye: Secondary | ICD-10-CM | POA: Diagnosis not present

## 2014-04-22 DIAGNOSIS — H2510 Age-related nuclear cataract, unspecified eye: Secondary | ICD-10-CM | POA: Diagnosis not present

## 2014-04-22 DIAGNOSIS — B0230 Zoster ocular disease, unspecified: Secondary | ICD-10-CM | POA: Diagnosis not present

## 2014-05-02 ENCOUNTER — Ambulatory Visit (INDEPENDENT_AMBULATORY_CARE_PROVIDER_SITE_OTHER): Payer: Medicare Other | Admitting: Emergency Medicine

## 2014-05-02 ENCOUNTER — Encounter: Payer: Self-pay | Admitting: Emergency Medicine

## 2014-05-02 VITALS — BP 130/80 | HR 89 | Ht 66.0 in | Wt 140.6 lb

## 2014-05-02 DIAGNOSIS — Z8612 Personal history of poliomyelitis: Secondary | ICD-10-CM | POA: Diagnosis not present

## 2014-05-02 DIAGNOSIS — J449 Chronic obstructive pulmonary disease, unspecified: Secondary | ICD-10-CM | POA: Diagnosis not present

## 2014-05-02 DIAGNOSIS — I2510 Atherosclerotic heart disease of native coronary artery without angina pectoris: Secondary | ICD-10-CM | POA: Diagnosis not present

## 2014-05-02 NOTE — Assessment & Plan Note (Addendum)
With R sided muscle weakness. This in combination with his scoliosis and elevated L HD account for the restrictive component of his PFT's

## 2014-05-02 NOTE — Progress Notes (Signed)
Subjective:    Patient ID: [name], male    DOB: 05/19/1936, 78 y.o.   MRN: [id number]  HPI 78 yo former mild smoker (5 pk-yrs), hx CAD/CABG, post-polio, Raynaud's, A Fib, brainstem CVA w Horner's syndrome. He is referred by L. Gerhardt for dyspnea. He has had dyspnea and dizziness that at one point seemed to correlate with his A Fib, but more recently he can't tell the difference whether he is in A fib or not. He decided not to start amiodarone. PFT done 04/02/14 show mixed obstruction and restriction, borderline BD response.   He has dyspnea with walking. Sometimes made him feel tight, like air won't move. He paces himself. Occasionally coughs, no wheeze. He snores at night. No CP.   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 No birds. No water damage.    Review of Systems  Constitutional: Negative for fever and unexpected weight change.  HENT: Positive for congestion, postnasal drip and sinus pressure. Negative for dental problem, ear pain, nosebleeds, rhinorrhea, sneezing, sore throat and trouble swallowing.   Eyes: Positive for itching. Negative for redness.  Respiratory: Positive for shortness of breath. Negative for cough, chest tightness and wheezing.   Cardiovascular: Positive for palpitations and leg swelling.  Gastrointestinal: Negative for nausea and vomiting.  Genitourinary: Negative for dysuria.  Musculoskeletal: Negative for joint swelling.  Skin: Negative for rash.  Neurological: Negative for headaches.  Hematological: Bruises/bleeds easily.  Psychiatric/Behavioral: Negative for dysphoric mood. The patient is not nervous/anxious.    Past Medical History  Diagnosis Date  . Coronary artery disease     First MI in 1988. PCI in 1996 with subsequent CABG in 1996 due to restenosis. S/P stents to LCX in 2009. Noted to have residual disease in the LAD in a diffuse manner and atretic LIMA graft. He is managed medically.   [name] History of Raynaud's  syndrome   . 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 GERD (gastroesophageal reflux disease)   . Hyperlipidemia   . PVC (premature ventricular contraction)   . 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 Scoliosis     mild  . Abnormal nuclear cardiac imaging test March 2011    Has positive EKG response, no perfusion defect and normal EF  . Internal hemorrhoids   . Adenomatous colon polyp 12/2002  . Persistent atrial fibrillation      Family History  Problem Relation Age of Onset  . Heart disease Mother   . Heart disease Father   . Other Brother     brother had heart transplant  . Colon cancer Neg Hx      History   Social History  . Marital Status: Married    Spouse Name: N/A    Number of Children: 2  . Years of Education: N/A   Occupational History  . Pharmacist    Social History Main Topics  . Smoking status: Former Smoker -- 0.50 packs/day for 5 years    Types: Cigarettes    Quit date: 12/27/1966  . Smokeless tobacco: Never Used  . Alcohol Use: No  . Drug Use: No  . Sexual Activity: Yes   Other Topics Concern  . 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 Morphine  And Related Other (See Comments)    IV forms- vein irritation     Outpatient Prescriptions Prior to Visit  Medication Sig Dispense Refill  . acetaminophen (TYLENOL) 325 MG tablet Take 325 mg by mouth every 4 (four) hours as needed for pain.      . B Complex Vitamins (VITAMIN-B COMPLEX PO) Take 1 capsule by mouth daily.       [name] BYSTOLIC 10 MG tablet TAKE 1 TABLET BY MOUTH EVERY DAY  90 tablet  0  . Cholecalciferol (VITAMIN D PO) Take by mouth as directed. Taking 50,000 2 times per month      . furosemide (LASIX) 20 MG tablet Take 1 tablet (20 mg total) by mouth as needed.  15 tablet  3  . lactobacillus acidophilus & bulgar (LACTINEX) chewable tablet  Chew 2 tablets by mouth daily.       [name] LORazepam (ATIVAN) 0.5 MG tablet Take 0.5 mg by mouth at bedtime.       [name] LYRICA 50 MG capsule Take 50 mg by mouth daily. Slowly decreasing dosage      . magnesium oxide (MAG-OX) 400 MG tablet Take 400 mg by mouth daily.        [name] RANEXA 1000 MG SR tablet TAKE 1 TABLET BY MOUTH TWICE A DAY  180 tablet  3  . ranitidine (ZANTAC) 300 MG tablet TAKE 1 TABLET BY MOUTH EVERY DAY  30 tablet  3  . Rivaroxaban (XARELTO) 20 MG TABS tablet Take 1 tablet (20 mg total) by mouth daily.  30 tablet  6  . rosuvastatin (CRESTOR) 40 MG tablet Take 40 mg by mouth daily.        . Testosterone (TESTODERM TD) Place 1 application onto the skin daily. 10% Testosterone       . traZODone (DESYREL) 50 MG tablet Take 50 mg by mouth at bedtime.      [name] PROLENSA 0.07 % SOLN as needed.        No facility-administered medications prior to visit.         Objective:   Physical Exam Filed Vitals:   05/02/14 1510  BP: 130/80  Pulse: 89  Height: 5\' 6"  (1.676 m)  Weight: 140 lb 9.6 oz (63.776 kg)  SpO2: 96%   Gen: Pleasant, well-nourished, in no distress,  normal affect  ENT: No lesions,  mouth clear,  oropharynx clear, no postnasal drip  Neck: No JVD, no TMG, no carotid bruits  Lungs: No use of accessory muscles, no dullness to percussion, clear without rales or rhonchi  Cardiovascular: RRR, heart sounds normal, no murmur or gallops, no peripheral edema  Musculoskeletal: No deformities, no cyanosis or clubbing  Neuro: alert, non focal  Skin: Warm, no lesions or rashes      Assessment & Plan:  History of post-polio syndrome With R sided muscle weakness. This in combination with his scoliosis and elevated L HD account for the restrictive component of his PFT's  COPD (chronic obstructive pulmonary disease) Obstruction on PFT with a BD response.  - trial of symbicort to see if he benefits

## 2014-05-02 NOTE — Assessment & Plan Note (Signed)
Obstruction on PFT with a BD response.  - trial of symbicort to see if he benefits

## 2014-05-02 NOTE — Patient Instructions (Signed)
Your restrictive lung disease is due to your Right-sided respiratory muscles, some scoliosis and an elevated left hemi-diaphragm We will try treating obstructive lung disease with Symbicort 2 puffs twice a day. Please rinse and gargle after you take this medication.  Follow with Dr Lamonte Sakai in 1 month

## 2014-05-08 ENCOUNTER — Other Ambulatory Visit: Payer: Self-pay | Admitting: *Deleted

## 2014-05-15 ENCOUNTER — Ambulatory Visit (INDEPENDENT_AMBULATORY_CARE_PROVIDER_SITE_OTHER): Payer: Medicare Other | Admitting: Cardiology

## 2014-05-15 ENCOUNTER — Encounter: Payer: Self-pay | Admitting: Cardiology

## 2014-05-15 VITALS — BP 140/74 | HR 104 | Ht 66.0 in | Wt 137.0 lb

## 2014-05-15 DIAGNOSIS — I4891 Unspecified atrial fibrillation: Secondary | ICD-10-CM | POA: Diagnosis not present

## 2014-05-15 DIAGNOSIS — J449 Chronic obstructive pulmonary disease, unspecified: Secondary | ICD-10-CM | POA: Diagnosis not present

## 2014-05-15 DIAGNOSIS — I2510 Atherosclerotic heart disease of native coronary artery without angina pectoris: Secondary | ICD-10-CM

## 2014-05-15 DIAGNOSIS — E785 Hyperlipidemia, unspecified: Secondary | ICD-10-CM

## 2014-05-15 MED ORDER — NEBIVOLOL HCL 10 MG PO TABS: ORAL_TABLET | ORAL | Status: DC

## 2014-05-15 NOTE — Patient Instructions (Addendum)
Increase bystolic to 15mg  daily. This will be 1 and 1/2 of a 10mg  tablet daily at the same time.   You have been referred to Dr Romeo Apple [phone])  in [place] for evaluation of atrial fibrillation ablation.   Your physician recommends that you return for a FASTING lipid profile /BMET/Liver profile in 2 months.   Your physician recommends that you schedule a follow-up appointment in: about 2 months with Dr Aundra Dubin.

## 2014-05-16 NOTE — Progress Notes (Signed)
Patient ID: [name], male   DOB: 08-19-1936, 78 y.o.   MRN: [id number] PCP: Dr. Reynaldo Weiss  78 yo with history of CAD s/p CABG presents for cardiology followup.  His last cath was in 11/09.  The SVG-distal RCA was patent, the CFX system was patent.  His LIMA was atretic and there were serial 60% and 80% stenoses in the native LAD.  He was managed medically.  Echo in 8/14 showed EF 55-60% with moderate MR and moderate TR.   He was initially noted to have atrial fibrillation in the summer of 2014. He was started on Xarelto and cardioverted to NSR in 8/14.  Recurrent atrial fibrillation was noted in 1/15, and he was cardioverted to NSR again.  This time, NSR did not hold long.  He is currently in persistent atrial fibrillation.  We decided not to start Tikosyn due to prolonged QT interval (and he is on ranolazine chronically which works well to limit angina).  He does not feel palpitations, but he clearly feels worse when in atrial fibrillation.  He is more fatigued.  He is short of breath walking up hills or up stairs.  He does not tend to have problems walking on flat ground.  Weight is stable. No orthopnea/PND.  No chest pain.  He has only taken Lasix once recently.   He saw Dr. Rayann Weiss for consideration of atrial fibrillation ablation.  Dr. Rayann Weiss thought that he would be at higher risk of recurrence and wanted him to start on amiodarone initially with attempted cardioversion on amiodarone.  If this were to fail, would then ablate on amiodarone.  Mr [name] is very reluctant to start amiodarone given side effect profile.  He had PFTs done prior to potentially starting amiodarone.  He was noted to have a moderate obstructive defect and a severe restrictive defect.  He saw pulmonary who thought this was due to COPD + restriction from elevated left hemidiaphragm and post-polio syndrome.  He is now on Symbicort and thinks it helps some.     Labs (8/12): K 4.1, creatinine 1.2, LDL 91, HDL 53 Labs (8/14): K 4.6,  creatinine 1.1 Labs (11/14): K 4.6, creatinine 1.2, LDL 91, HDL 56 Labs (3/15): AST 38, ALT 32, TSH normal, BNP 237  PMH: 1. CAD: 1st MI in 1988.  CABG 1996.  PCI to CFX in 2009.  LHC (11/09) SVG-dRCA patent, total occlusion RCA, patent CFX stent, atretic LIMA, serial 60 and 80% proximal LAD stenoses, EF 60% with basal inferior hypokinesis.  Myoview in 2011 with no ischemia or infarction.  Echo (10/12) with EF 55-60%, mild LVH, mild MR.  Echo (8/14) with EF 55-60%, moderate MR, moderate TR, PA systolic pressure 35 mmHg.  2. Raynauds 3. Post-polio syndrome 4. GERD with dilation of esophageal stricture in 12/12.  5. Hyperlipidemia 6. Brainstem stroke with Horner's syndrome.  7. Scoliosis. 8. H/o appendectomy 9. Herpes Zoster 10. Atrial fibrillation: DCCV to NSR in 8/14. DCCV to NSR in 1/15. Now in persistent atrial fibrillation.  11. PFTs (4/15) with FVC 59%, FEV1 54%, ratio 91%, DLCO 53% => moderate obstructive defect thought to be related to COPD and severe restrictive defect thought to be due to elevated left hemidiaphragm and post-polio syndrome.   SH: quit tobacco while in his 12s.  Married.  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   FH: Brother with CAD, heart transplant.   ROS: All systems reviewed and negative except as per HPI.   Current Outpatient Prescriptions  Medication Sig Dispense Refill  .  acetaminophen (TYLENOL) 325 MG tablet Take 325 mg by mouth every 4 (four) hours as needed for pain.      . B Complex Vitamins (VITAMIN-B COMPLEX PO) Take 1 capsule by mouth daily.       . budesonide-formoterol (SYMBICORT) 160-4.5 MCG/ACT inhaler Inhale 2 puffs into the lungs 2 (two) times daily.      . Cholecalciferol (VITAMIN D PO) Take by mouth as directed. Taking 50,000 2 times per month      . furosemide (LASIX) 20 MG tablet Take 1 tablet (20 mg total) by mouth as needed.  15 tablet  3  . lactobacillus acidophilus & bulgar (LACTINEX) chewable tablet Chew 2 tablets by mouth daily.       [name]  LORazepam (ATIVAN) 0.5 MG tablet Take 0.5 mg by mouth at bedtime.       [name] LYRICA 50 MG capsule Take 50 mg by mouth daily. Slowly decreasing dosage      . magnesium oxide (MAG-OX) 400 MG tablet Take 400 mg by mouth daily.        [name] RANEXA 1000 MG SR tablet TAKE 1 TABLET BY MOUTH TWICE A DAY  180 tablet  3  . ranitidine (ZANTAC) 300 MG tablet TAKE 1 TABLET BY MOUTH EVERY DAY  30 tablet  3  . Rivaroxaban (XARELTO) 20 MG TABS tablet Take 1 tablet (20 mg total) by mouth daily.  30 tablet  6  . rosuvastatin (CRESTOR) 40 MG tablet Take 40 mg by mouth daily.        . Testosterone (TESTODERM TD) Place 1 application onto the skin daily. 10% Testosterone       . traZODone (DESYREL) 50 MG tablet Take 50 mg by mouth at bedtime.      . valACYclovir (VALTREX) 1000 MG tablet Take 1,000 mg by mouth 2 (two) times daily.      . nebivolol (BYSTOLIC) 10 MG tablet 1 and 1/2 tablets daily  135 tablet  3   No current facility-administered medications for this visit.    BP 130/68  Pulse 66  Ht 5\' 6"  (1.676 m)  Wt 60.328 kg (133 lb)  BMI 21.47 kg/m2 General: NAD Neck: No JVD, no thyromegaly or thyroid nodule.  Lungs: Clear to auscultation bilaterally with normal respiratory effort. CV: Nondisplaced PMI.  Heart regular S1/S2, no S3/S4, 1/6 HSM at apex.  Trace ankle edema.  No carotid bruit.  Normal pedal pulses.  Abdomen: Soft, nontender, no hepatosplenomegaly, no distention.  Neurologic: Alert and oriented x 3.  Psych: Normal affect.  Assessment/Plan: 1. Atrial fibrillation: He is now in persistent atrial fibrillation despite repeat cardioversion in 1/15.  He clearly feels worse in atrial fibrillation than in NSR, even with rate controlled.  He would need antiarrhythmic to hold NSR, it looks like.  He is not a Ic candidate due to coronary disease.  Sotalol and dofetilide would be difficult due to prolonged baseline QTc (he is also on Ranexa which has been very helpful with his anginal symptoms).  He is concerned  about the side effects of amiodarone and has abnormal baseline PFTs.  He saw Dr [name], who recommended a trial of amiodarone with ablation on amiodarone if he were to fail repeat DCCV on amiodarone. He would like a second opinion on the role of atrial fibrillation ablation.  I will refer him to [facility] for repeat EP evaluation. Continue Xarelto.  With HR elevated a bit, will increase Bystolic to 15 mg daily.   2. CAD: Stable with  no chest pain.  He is not taking aspirin as he has stable CAD and is on Xarelto.  Continue nebivolol and Crestor.  3. Hyperlipidemia: Lipids acceptable on high-dose Crestor.  Repeat lipids in 2 months.  4. Chronic diastolic CHF: He does not look volume overloaded today.  He is not using Lasix very often. 5. Pulmonary: PFTs suggested both a restrictive and obstructive defect.  The restrictive defect is likely from post-polio syndrome and elevated left hemidiaphragm.  Mr Ginsberg never smoked much, so the obstructive defect is more surprising.  Symbicort has seemed to help.  He is concerned about starting amiodarone given the baseline lung abnormalities.   Shunta Mclaurin Claris Gladden 05/16/2014     Larey Dresser 05/16/2014

## 2014-05-24 DIAGNOSIS — I4891 Unspecified atrial fibrillation: Secondary | ICD-10-CM | POA: Diagnosis not present

## 2014-05-29 ENCOUNTER — Other Ambulatory Visit: Payer: Self-pay

## 2014-05-29 DIAGNOSIS — B0230 Zoster ocular disease, unspecified: Secondary | ICD-10-CM | POA: Diagnosis not present

## 2014-05-29 MED ORDER — RANITIDINE HCL 300 MG PO TABS: ORAL_TABLET | ORAL | Status: DC

## 2014-06-04 ENCOUNTER — Other Ambulatory Visit: Payer: Self-pay | Admitting: Nurse Practitioner

## 2014-06-05 ENCOUNTER — Ambulatory Visit (INDEPENDENT_AMBULATORY_CARE_PROVIDER_SITE_OTHER): Payer: Medicare Other | Admitting: Emergency Medicine

## 2014-06-05 ENCOUNTER — Encounter: Payer: Self-pay | Admitting: Emergency Medicine

## 2014-06-05 VITALS — BP 128/80 | HR 85 | Ht 66.0 in | Wt 140.8 lb

## 2014-06-05 DIAGNOSIS — I2510 Atherosclerotic heart disease of native coronary artery without angina pectoris: Secondary | ICD-10-CM | POA: Diagnosis not present

## 2014-06-05 DIAGNOSIS — J4489 Other specified chronic obstructive pulmonary disease: Secondary | ICD-10-CM

## 2014-06-05 DIAGNOSIS — J449 Chronic obstructive pulmonary disease, unspecified: Secondary | ICD-10-CM

## 2014-06-05 NOTE — Assessment & Plan Note (Signed)
We will stop Symbicort for now. Keep track of how your throat symptoms and breathing to this change We will consider an alternative inhaled medication in the future.  We will use albuterol 2 puffs as needed for shortness of breath, or before exercise.  Follow with Dr Lamonte Sakai in 2 months or sooner if you have any problems

## 2014-06-05 NOTE — Patient Instructions (Signed)
We will stop Symbicort for now. Keep track of how your throat symptoms and breathing to this change We will consider an alternative inhaled medication in the future.  We will use albuterol 2 puffs as needed for shortness of breath, or before exercise.  Follow with Dr Lamonte Sakai in 2 months or sooner if you have any problems.

## 2014-06-05 NOTE — Progress Notes (Signed)
Subjective:    Patient ID: [name], male    DOB: 1936/01/17, 78 y.o.   MRN: [id number]  HPI 78 yo former mild smoker (5 pk-yrs), hx CAD/CABG, post-polio, Raynaud's, A Fib, brainstem CVA w Horner's syndrome. He is referred by L. Gerhardt for dyspnea. He has had dyspnea and dizziness that at one point seemed to correlate with his A Fib, but more recently he can't tell the difference whether he is in A fib or not. He decided not to start amiodarone. PFT done 04/02/14 show mixed obstruction and restriction, borderline BD response.   He has dyspnea with walking. Sometimes made him feel tight, like air won't move. He paces himself. Occasionally coughs, no wheeze. He snores at night. No CP.   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 No birds. No water damage.   ROV 06/05/14 -- hx CABG, A Fib, mixed disease on his PFT, follows up for dyspnea. Last visit we started Symbicort. He believes that he may have benefiting from it some w regard to exercise tolerance. He is set to have an ablation at [facility] on 6/30. Hopefully this will benefit his breathing as well.    Review of Systems  Constitutional: Negative for fever and unexpected weight change.  HENT: Positive for congestion, postnasal drip and sinus pressure. Negative for dental problem, ear pain, nosebleeds, rhinorrhea, sneezing, sore throat and trouble swallowing.   Eyes: Positive for itching. Negative for redness.  Respiratory: Positive for shortness of breath. Negative for cough, chest tightness and wheezing.   Cardiovascular: Positive for palpitations and leg swelling.  Gastrointestinal: Negative for nausea and vomiting.  Genitourinary: Negative for dysuria.  Musculoskeletal: Negative for joint swelling.  Skin: Negative for rash.  Neurological: Negative for headaches.  Hematological: Bruises/bleeds easily.  Psychiatric/Behavioral: Negative for dysphoric mood. The patient is not nervous/anxious.    Past Medical  History  Diagnosis Date  . Coronary artery disease     First MI in 1988. PCI in 1996 with subsequent CABG in 1996 due to restenosis. S/P stents to LCX in 2009. Noted to have residual disease in the LAD in a diffuse manner and atretic LIMA graft. He is managed medically.   [name] History of Raynaud's syndrome   . 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 GERD (gastroesophageal reflux disease)   . Hyperlipidemia   . PVC (premature ventricular contraction)   . 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 Scoliosis     mild  . Abnormal nuclear cardiac imaging test March 2011    Has positive EKG response, no perfusion defect and normal EF  . Internal hemorrhoids   . Adenomatous colon polyp 12/2002  . Persistent atrial fibrillation      Family History  Problem Relation Age of Onset  . Heart disease Mother   . Heart disease Father   . Other Brother     brother had heart transplant  . Colon cancer Neg Hx      History   Social History  . Marital Status: Married    Spouse Name: N/A    Number of Children: 2  . Years of Education: N/A   Occupational History  . Pharmacist    Social History Main Topics  . Smoking status: Former Smoker -- 0.50 packs/day for 5 years    Types: Cigarettes    Quit date: 12/27/1966  . Smokeless tobacco: Never Used  . Alcohol  Use: No  . Drug Use: No  . Sexual Activity: Yes   Other Topics Concern  . 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 Morphine And Related Other (See Comments)    IV forms- vein irritation     Outpatient Prescriptions Prior to Visit  Medication Sig Dispense Refill  . acetaminophen (TYLENOL) 325 MG tablet Take 325 mg by mouth every 4 (four) hours as needed for pain.      . B Complex Vitamins (VITAMIN-B COMPLEX PO) Take 1 capsule by mouth daily.       . budesonide-formoterol (SYMBICORT)  160-4.5 MCG/ACT inhaler Inhale 2 puffs into the lungs 2 (two) times daily.      . Cholecalciferol (VITAMIN D PO) Take by mouth as directed. Taking 50,000 2 times per month      . furosemide (LASIX) 20 MG tablet Take 1 tablet (20 mg total) by mouth as needed.  15 tablet  3  . lactobacillus acidophilus & bulgar (LACTINEX) chewable tablet Chew 2 tablets by mouth daily.       [name] LORazepam (ATIVAN) 0.5 MG tablet Take 0.5 mg by mouth at bedtime.       [name] LYRICA 50 MG capsule Take 50 mg by mouth daily. Slowly decreasing dosage      . magnesium oxide (MAG-OX) 400 MG tablet Take 400 mg by mouth daily.        . nebivolol (BYSTOLIC) 10 MG tablet 1 and 1/2 tablets daily  135 tablet  3  . RANEXA 1000 MG SR tablet TAKE 1 TABLET BY MOUTH TWICE A DAY  180 tablet  3  . ranitidine (ZANTAC) 300 MG tablet TAKE 1 TABLET BY MOUTH EVERY DAY  30 tablet  3  . rosuvastatin (CRESTOR) 40 MG tablet Take 40 mg by mouth daily.        . Testosterone (TESTODERM TD) Place 1 application onto the skin daily. 10% Testosterone       . traZODone (DESYREL) 50 MG tablet Take 50 mg by mouth at bedtime.      . valACYclovir (VALTREX) 1000 MG tablet Take 500 mg by mouth 2 (two) times daily.       Alveda Reasons 20 MG TABS tablet TAKE 1 TABLET BY MOUTH EVERY DAY  30 tablet  6   No facility-administered medications prior to visit.         Objective:   Physical Exam Filed Vitals:   06/05/14 0931  BP: 128/80  Pulse: 85  Height: 5\' 6"  (1.676 m)  Weight: 140 lb 12.8 oz (63.866 kg)  SpO2: 97%   Gen: Pleasant, well-nourished, in no distress,  normal affect  ENT: No lesions,  mouth clear,  oropharynx clear, no postnasal drip  Neck: No JVD, no TMG, no carotid bruits  Lungs: No use of accessory muscles, no dullness to percussion, clear without rales or rhonchi  Cardiovascular: RRR, heart sounds normal, no murmur or gallops, no peripheral edema  Musculoskeletal: No deformities, no cyanosis or clubbing  Neuro: alert, non focal  Skin:  Warm, no lesions or rashes      Assessment & Plan:  COPD (chronic obstructive pulmonary disease) We will stop Symbicort for now. Keep track of how your throat symptoms and breathing to this change We will consider an alternative inhaled medication in the future.  We will use  albuterol 2 puffs as needed for shortness of breath, or before exercise.  Follow with Dr Lamonte Sakai in 2 months or sooner if you have any problems

## 2014-06-12 ENCOUNTER — Telehealth: Payer: Self-pay | Admitting: Emergency Medicine

## 2014-06-12 NOTE — Telephone Encounter (Signed)
lmtcb x1 

## 2014-06-13 MED ORDER — ALBUTEROL SULFATE HFA 108 (90 BASE) MCG/ACT IN AERS: 2.0000 | INHALATION_SPRAY | RESPIRATORY_TRACT | Status: AC | PRN

## 2014-06-13 NOTE — Telephone Encounter (Signed)
Spoke with patient- he states that he needs Albuterol HFA sent to [facility]; patient aware that I have sent Rx and nothing more needed at this time.

## 2014-06-24 DIAGNOSIS — I259 Chronic ischemic heart disease, unspecified: Secondary | ICD-10-CM | POA: Diagnosis not present

## 2014-06-24 DIAGNOSIS — Z7901 Long term (current) use of anticoagulants: Secondary | ICD-10-CM | POA: Diagnosis not present

## 2014-06-24 DIAGNOSIS — I4949 Other premature depolarization: Secondary | ICD-10-CM | POA: Insufficient documentation

## 2014-06-24 DIAGNOSIS — I4891 Unspecified atrial fibrillation: Secondary | ICD-10-CM | POA: Diagnosis not present

## 2014-06-24 DIAGNOSIS — I517 Cardiomegaly: Secondary | ICD-10-CM | POA: Diagnosis not present

## 2014-06-24 DIAGNOSIS — Z9861 Coronary angioplasty status: Secondary | ICD-10-CM | POA: Diagnosis not present

## 2014-06-24 DIAGNOSIS — I6350 Cerebral infarction due to unspecified occlusion or stenosis of unspecified cerebral artery: Secondary | ICD-10-CM | POA: Insufficient documentation

## 2014-06-24 DIAGNOSIS — Z8673 Personal history of transient ischemic attack (TIA), and cerebral infarction without residual deficits: Secondary | ICD-10-CM | POA: Diagnosis not present

## 2014-06-24 DIAGNOSIS — K222 Esophageal obstruction: Secondary | ICD-10-CM | POA: Diagnosis not present

## 2014-06-24 DIAGNOSIS — Z01818 Encounter for other preprocedural examination: Secondary | ICD-10-CM | POA: Diagnosis not present

## 2014-06-24 DIAGNOSIS — E785 Hyperlipidemia, unspecified: Secondary | ICD-10-CM | POA: Diagnosis present

## 2014-06-24 DIAGNOSIS — I669 Occlusion and stenosis of unspecified cerebral artery: Secondary | ICD-10-CM | POA: Insufficient documentation

## 2014-06-24 DIAGNOSIS — K219 Gastro-esophageal reflux disease without esophagitis: Secondary | ICD-10-CM | POA: Diagnosis present

## 2014-06-24 DIAGNOSIS — Z5181 Encounter for therapeutic drug level monitoring: Secondary | ICD-10-CM | POA: Diagnosis not present

## 2014-06-24 DIAGNOSIS — E8779 Other fluid overload: Secondary | ICD-10-CM | POA: Diagnosis present

## 2014-06-24 DIAGNOSIS — Z8679 Personal history of other diseases of the circulatory system: Secondary | ICD-10-CM | POA: Insufficient documentation

## 2014-06-24 DIAGNOSIS — I7300 Raynaud's syndrome without gangrene: Secondary | ICD-10-CM | POA: Diagnosis present

## 2014-06-24 DIAGNOSIS — J449 Chronic obstructive pulmonary disease, unspecified: Secondary | ICD-10-CM | POA: Diagnosis present

## 2014-06-24 DIAGNOSIS — I2510 Atherosclerotic heart disease of native coronary artery without angina pectoris: Secondary | ICD-10-CM | POA: Diagnosis present

## 2014-06-24 DIAGNOSIS — I10 Essential (primary) hypertension: Secondary | ICD-10-CM | POA: Diagnosis not present

## 2014-06-24 DIAGNOSIS — Z8612 Personal history of poliomyelitis: Secondary | ICD-10-CM | POA: Insufficient documentation

## 2014-06-24 DIAGNOSIS — I252 Old myocardial infarction: Secondary | ICD-10-CM | POA: Diagnosis not present

## 2014-06-25 DIAGNOSIS — I4891 Unspecified atrial fibrillation: Secondary | ICD-10-CM | POA: Diagnosis not present

## 2014-06-25 DIAGNOSIS — I517 Cardiomegaly: Secondary | ICD-10-CM | POA: Diagnosis not present

## 2014-06-25 DIAGNOSIS — Z5181 Encounter for therapeutic drug level monitoring: Secondary | ICD-10-CM | POA: Diagnosis not present

## 2014-06-25 DIAGNOSIS — I10 Essential (primary) hypertension: Secondary | ICD-10-CM | POA: Diagnosis not present

## 2014-06-25 DIAGNOSIS — Z7901 Long term (current) use of anticoagulants: Secondary | ICD-10-CM | POA: Diagnosis not present

## 2014-07-05 DIAGNOSIS — Z79899 Other long term (current) drug therapy: Secondary | ICD-10-CM | POA: Diagnosis not present

## 2014-07-05 DIAGNOSIS — I4891 Unspecified atrial fibrillation: Secondary | ICD-10-CM | POA: Diagnosis not present

## 2014-07-08 ENCOUNTER — Other Ambulatory Visit: Payer: Self-pay | Admitting: *Deleted

## 2014-07-08 ENCOUNTER — Other Ambulatory Visit: Payer: Self-pay | Admitting: Cardiology

## 2014-07-08 MED ORDER — NEBIVOLOL HCL 10 MG PO TABS: ORAL_TABLET | ORAL | Status: DC

## 2014-07-11 ENCOUNTER — Encounter: Payer: Self-pay | Admitting: *Deleted

## 2014-07-11 ENCOUNTER — Other Ambulatory Visit: Payer: Self-pay | Admitting: *Deleted

## 2014-07-15 ENCOUNTER — Telehealth: Payer: Self-pay | Admitting: Nurse Practitioner

## 2014-07-15 NOTE — Telephone Encounter (Signed)
S/w pt's wife stated [name] sent pt to [facility] to get ablation.  Stated it worked goes to [facility] next week for blood work and didn't want to get the same blood work drawn here. Wanted to know if we would draw blood here instead of  [place] .   I stated call me back tomorrow and let me know what [facility] wants to draw and will see what can do.

## 2014-07-15 NOTE — Telephone Encounter (Signed)
New message     What blood work has been ordered for this Friday the 24th.  Pt sees Dr Aundra Dubin on Monday the 27th.  They do not want [place] and [facility] draw the same labs.  Wife want message sent to [facility] because she knows them better than Dr Claris Gladden nurse

## 2014-07-19 ENCOUNTER — Other Ambulatory Visit (INDEPENDENT_AMBULATORY_CARE_PROVIDER_SITE_OTHER): Payer: Medicare Other

## 2014-07-19 DIAGNOSIS — I4891 Unspecified atrial fibrillation: Secondary | ICD-10-CM | POA: Diagnosis not present

## 2014-07-19 DIAGNOSIS — E785 Hyperlipidemia, unspecified: Secondary | ICD-10-CM | POA: Diagnosis not present

## 2014-07-19 LAB — HEPATIC FUNCTION PANEL
ALT: 34 U/L (ref 0–53)
AST: 43 U/L — ABNORMAL HIGH (ref 0–37)
Albumin: 4 g/dL (ref 3.5–5.2)
Alkaline Phosphatase: 88 U/L (ref 39–117)
BILIRUBIN TOTAL: 0.9 mg/dL (ref 0.2–1.2)
Bilirubin, Direct: 0.1 mg/dL (ref 0.0–0.3)
TOTAL PROTEIN: 7.2 g/dL (ref 6.0–8.3)

## 2014-07-19 LAB — LIPID PANEL
CHOLESTEROL: 162 mg/dL (ref 0–200)
HDL: 48.2 mg/dL (ref >39.00)
LDL Cholesterol: 96 mg/dL (ref 0–99)
NonHDL: 113.8
TRIGLYCERIDES: 88 mg/dL (ref 0.0–149.0)
Total CHOL/HDL Ratio: 3
VLDL: 17.6 mg/dL (ref 0.0–40.0)

## 2014-07-19 LAB — BASIC METABOLIC PANEL
BUN: 17 mg/dL (ref 6–23)
CALCIUM: 9.8 mg/dL (ref 8.4–10.5)
CO2: 30 mEq/L (ref 19–32)
Chloride: 102 mEq/L (ref 96–112)
Creatinine, Ser: 1.2 mg/dL (ref 0.4–1.5)
GFR: 62.54 mL/min (ref >60.00)
GLUCOSE: 95 mg/dL (ref 70–99)
Potassium: 4.2 mEq/L (ref 3.5–5.1)
SODIUM: 137 meq/L (ref 135–145)

## 2014-07-22 ENCOUNTER — Encounter: Payer: Self-pay | Admitting: Cardiology

## 2014-07-22 ENCOUNTER — Ambulatory Visit (INDEPENDENT_AMBULATORY_CARE_PROVIDER_SITE_OTHER): Payer: Medicare Other | Admitting: Cardiology

## 2014-07-22 VITALS — BP 116/65 | HR 57 | Ht 66.0 in | Wt 136.0 lb

## 2014-07-22 DIAGNOSIS — E785 Hyperlipidemia, unspecified: Secondary | ICD-10-CM | POA: Diagnosis not present

## 2014-07-22 DIAGNOSIS — I4891 Unspecified atrial fibrillation: Secondary | ICD-10-CM

## 2014-07-22 DIAGNOSIS — I2510 Atherosclerotic heart disease of native coronary artery without angina pectoris: Secondary | ICD-10-CM

## 2014-07-22 DIAGNOSIS — I480 Paroxysmal atrial fibrillation: Secondary | ICD-10-CM

## 2014-07-22 LAB — MAGNESIUM: Magnesium: 2.2 mg/dL (ref 1.5–2.5)

## 2014-07-22 NOTE — Progress Notes (Signed)
Patient ID: [name], male   DOB: [birth year], 78 y.o.   MRN: [id number] PCP: Dr. Reynaldo Weiss  78 yo with history of CAD s/p CABG presents for cardiology followup.  His last cath was in 11/09.  The SVG-distal RCA was patent, the CFX system was patent.  His LIMA was atretic and there were serial 60% and 80% stenoses in the native LAD.  He was managed medically.  Echo in 8/14 showed EF 55-60% with moderate Jimmy and moderate TR.   He was initially noted to have atrial fibrillation in the summer of 2014. He was started on Xarelto and cardioverted to NSR in 8/14.  Recurrent atrial fibrillation was noted in 1/15, and he was cardioverted to NSR again.  This time, NSR did not hold long.  At last appointment in 5/15, he was in persistent atrial fibrillation.  I referred him to [name] where he had atrial fibrillation ablation and Tikosyn loading.  Ranolazine was stopped due to risk of QT prolongation and Imdur was started as an anti-anginal instead.  Patient is in NSR today. No recent palpitations.  He feels good overall.  No exertional dyspnea or chest pain.    ECG: NSR, inferior TWIs, QTc 445 msec  Labs (8/12): K 4.1, creatinine 1.2, LDL 91, HDL 53 Labs (8/14): K 4.6, creatinine 1.1 Labs (11/14): K 4.6, creatinine 1.2, LDL 91, HDL 56 Labs (3/15): AST 38, ALT 32, TSH normal, BNP 237 Labs (7/15): LDL 96, HDL 48, K 4.2, creatinine 1.2, TSH normal, BNP 237, AST 38, ALT 32  PMH: 1. CAD: 1st MI in 1988.  CABG 1996.  PCI to CFX in 2009.  LHC (11/09) SVG-dRCA patent, total occlusion RCA, patent CFX stent, atretic LIMA, serial 60 and 80% proximal LAD stenoses, EF 60% with basal inferior hypokinesis.  Myoview in 2011 with no ischemia or infarction.  Echo (10/12) with EF 55-60%, mild LVH, mild Jimmy.  Echo (8/14) with EF 55-60%, moderate Jimmy, moderate TR, PA systolic pressure 35 mmHg.  2. Raynauds 3. Post-polio syndrome 4. GERD with dilation of esophageal stricture in 12/12.  5. Hyperlipidemia 6. Brainstem stroke with  Horner's syndrome.  7. Scoliosis. 8. H/o appendectomy 9. Herpes Zoster 10. Atrial fibrillation: DCCV to NSR in 8/14. DCCV to NSR in 1/15. Atrial fibrillation ablation 6/15 with Tikosyn loading (at [name]).   11. PFTs (4/15) with FVC 59%, FEV1 54%, ratio 91%, DLCO 53% => moderate obstructive defect thought to be related to COPD and severe restrictive defect thought to be due to elevated left hemidiaphragm and post-polio syndrome.   SH: quit tobacco while in his 76s.  Married.  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   FH: Brother with CAD, heart transplant.   ROS: All systems reviewed and negative except as per HPI.   Current Outpatient Prescriptions  Medication Sig Dispense Refill  . acetaminophen (TYLENOL) 325 MG tablet Take 325 mg by mouth every 4 (four) hours as needed for pain.      [name] albuterol (PROAIR HFA) 108 (90 BASE) MCG/ACT inhaler Inhale 2 puffs into the lungs every 4 (four) hours as needed for wheezing or shortness of breath (and prior to exercise).  1 Inhaler  3  . dofetilide (TIKOSYN) 250 MCG capsule Take 250 mcg by mouth every 12 (twelve) hours.      . isosorbide mononitrate (IMDUR) 30 MG 24 hr tablet Take 30 mg by mouth daily.      [name] lactobacillus acidophilus & bulgar (LACTINEX) chewable tablet Chew 2 tablets by mouth daily.       [name]  loratadine (CLARITIN) 10 MG tablet Take 10 mg by mouth daily.      [name] LORazepam (ATIVAN) 0.5 MG tablet Take 0.5 mg by mouth at bedtime as needed.       [name] LYRICA 50 MG capsule Take 50 mg by mouth daily. Slowly decreasing dosage      . magnesium oxide (MAG-OX) 400 MG tablet Take 400 mg by mouth daily.        . nebivolol (BYSTOLIC) 10 MG tablet       . ranitidine (ZANTAC) 300 MG tablet TAKE 1 TABLET BY MOUTH EVERY DAY  30 tablet  3  . rosuvastatin (CRESTOR) 40 MG tablet TAKE 1 TABLET BY MOUTH ONCE DAILY      . traZODone (DESYREL) 50 MG tablet Take 50 mg by mouth at bedtime.      . valACYclovir (VALTREX) 1000 MG tablet Take 500 mg by mouth daily.       Alveda Reasons 20 MG TABS tablet TAKE 1 TABLET BY MOUTH EVERY DAY  30 tablet  6  . B Complex Vitamins (VITAMIN-B COMPLEX PO) Take 1 capsule by mouth daily.       . Cholecalciferol (VITAMIN D PO) Take by mouth as directed. Taking 50,000 2 times per month      . Testosterone (TESTODERM TD) Place 1 application onto the skin daily. 10% Testosterone        No current facility-administered medications for this visit.    BP 130/68  Pulse 66  Ht 5\' 6"  (1.676 m)  Wt 60.328 kg (133 lb)  BMI 21.47 kg/m2 General: NAD Neck: No JVD, no thyromegaly or thyroid nodule.  Lungs: Clear to auscultation bilaterally with normal respiratory effort. CV: Nondisplaced PMI.  Heart regular S1/S2, no S3/S4, 1/6 HSM at apex.  No edema.  No carotid bruit.  Normal pedal pulses.  Abdomen: Soft, nontender, no hepatosplenomegaly, no distention.  Neurologic: Alert and oriented x 3.  Psych: Normal affect.  Assessment/Plan: 1. Atrial fibrillation: He is now in NSR on Tikosyn s/p atrial fibrillation ablation at [name] on 06/25/14.  He feel much better in NSR.  - Continue Tikosyn.  QTc normal today.  K normal recently, will check Mg today.  - Continue Xarelto - Can decrease nebivolol back to prior dose of 10 mg daily.  2. CAD: Stable with no chest pain.  He is not taking aspirin as he has stable CAD and is on Xarelto.  Continue nebivolol and Crestor.  3. Hyperlipidemia: Lipids acceptable on high-dose Crestor.    4. Chronic diastolic CHF: He does not look volume overloaded today.  He is not using Lasix. 5. Pulmonary: PFTs suggested both a restrictive and obstructive defect.  The restrictive defect is likely from post-polio syndrome and elevated left hemidiaphragm.  [name] never smoked much, so the obstructive defect is more surprising.   6. Elevated LFTs: Mild elevation in 7/15, will repeat LFTs in 1 month.     [name] 07/22/2014

## 2014-07-22 NOTE — Patient Instructions (Addendum)
Your physician recommends that you have lab work today--Magnesium level  Your physician recommends that you return for lab work in: 1 month--liver profile.  Your physician recommends that you schedule a follow-up appointment in: 4 months with Dr Aundra Dubin.

## 2014-07-26 DIAGNOSIS — I4891 Unspecified atrial fibrillation: Secondary | ICD-10-CM | POA: Diagnosis not present

## 2014-07-26 DIAGNOSIS — R0609 Other forms of dyspnea: Secondary | ICD-10-CM | POA: Diagnosis not present

## 2014-07-26 DIAGNOSIS — I2510 Atherosclerotic heart disease of native coronary artery without angina pectoris: Secondary | ICD-10-CM | POA: Diagnosis not present

## 2014-07-26 DIAGNOSIS — R0989 Other specified symptoms and signs involving the circulatory and respiratory systems: Secondary | ICD-10-CM | POA: Diagnosis not present

## 2014-08-16 ENCOUNTER — Ambulatory Visit (INDEPENDENT_AMBULATORY_CARE_PROVIDER_SITE_OTHER): Payer: Medicare Other | Admitting: Emergency Medicine

## 2014-08-16 ENCOUNTER — Encounter: Payer: Self-pay | Admitting: Emergency Medicine

## 2014-08-16 VITALS — BP 118/64 | HR 77 | Ht 66.0 in | Wt 137.0 lb

## 2014-08-16 DIAGNOSIS — J449 Chronic obstructive pulmonary disease, unspecified: Secondary | ICD-10-CM | POA: Diagnosis not present

## 2014-08-16 DIAGNOSIS — I2510 Atherosclerotic heart disease of native coronary artery without angina pectoris: Secondary | ICD-10-CM | POA: Diagnosis not present

## 2014-08-16 NOTE — Patient Instructions (Signed)
Please continue to have your albuterol available to use as needed Continue your loratadine daily.  We could consider nasal saline washes to treat your rhinitis.  Follow with Dr Lamonte Sakai in 12 months or sooner if you have any problems

## 2014-08-16 NOTE — Assessment & Plan Note (Signed)
Please continue to have your albuterol available to use as needed Continue your loratadine daily.  We could consider nasal saline washes to treat your rhinitis.  Follow with Dr Lamonte Sakai in 12 months or sooner if you have any problems

## 2014-08-16 NOTE — Progress Notes (Signed)
   Subjective:    Patient ID: [name], male    DOB: [birth year], 78 y.o.   MRN: [id number]  HPI 78 yo former mild smoker (5 pk-yrs), hx CAD/CABG, post-polio, Raynaud's, A Fib, brainstem CVA w Horner's syndrome. He is referred by L. Gerhardt for dyspnea. He has had dyspnea and dizziness that at one point seemed to correlate with his A Fib, but more recently he can't tell the difference whether he is in A fib or not. He decided not to start amiodarone. PFT done 04/02/14 show mixed obstruction and restriction, borderline BD response.   He has dyspnea with walking. Sometimes made him feel tight, like air won't move. He paces himself. Occasionally coughs, no wheeze. He snores at night. No CP.   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 No birds. No water damage.   ROV 06/05/14 -- hx CABG, A Fib, mixed disease on his PFT, follows up for dyspnea. Last visit we started Symbicort. He believes that he may have benefiting from it some w regard to exercise tolerance. He is set to have an ablation at [facility] on 6/30. Hopefully this will benefit his breathing as well.   ROV 08/16/14 -- hx CABG, A Fib, mixed disease on his PFT, follows up for dyspnea. He is s/p ablation > breathing has benefited!  He has been doing well, has some nasal and chest mucous. He is on loratadine daily, still with nasal gtt.    Review of Systems  Constitutional: Negative for fever and unexpected weight change.  HENT: Positive for congestion, postnasal drip and sinus pressure. Negative for dental problem, ear pain, nosebleeds, rhinorrhea, sneezing, sore throat and trouble swallowing.   Eyes: Positive for itching. Negative for redness.  Respiratory: Positive for shortness of breath. Negative for cough, chest tightness and wheezing.   Cardiovascular: Positive for palpitations and leg swelling.  Gastrointestinal: Negative for nausea and vomiting.  Genitourinary: Negative for dysuria.  Musculoskeletal: Negative for  joint swelling.  Skin: Negative for rash.  Neurological: Negative for headaches.  Hematological: Bruises/bleeds easily.  Psychiatric/Behavioral: Negative for dysphoric mood. The patient is not nervous/anxious.        Objective:   Physical Exam Filed Vitals:   08/16/14 0940  BP: 118/64  Pulse: 77  Height: 5\' 6"  (1.676 m)  Weight: 137 lb (62.143 kg)  SpO2: 97%   Gen: Pleasant, well-nourished, in no distress,  normal affect  ENT: No lesions,  mouth clear,  oropharynx clear, no postnasal drip  Neck: No JVD, no TMG, no carotid bruits  Lungs: No use of accessory muscles, no dullness to percussion, clear without rales or rhonchi  Cardiovascular: RRR, heart sounds normal, no murmur or gallops, no peripheral edema  Musculoskeletal: No deformities, no cyanosis or clubbing  Neuro: alert, non focal  Skin: Warm, no lesions or rashes      Assessment & Plan:  COPD (chronic obstructive pulmonary disease) Please continue to have your albuterol available to use as needed Continue your loratadine daily.  We could consider nasal saline washes to treat your rhinitis.  Follow with Dr Lamonte Sakai in 12 months or sooner if you have any problems

## 2014-08-19 ENCOUNTER — Other Ambulatory Visit (INDEPENDENT_AMBULATORY_CARE_PROVIDER_SITE_OTHER): Payer: Medicare Other

## 2014-08-19 DIAGNOSIS — I4891 Unspecified atrial fibrillation: Secondary | ICD-10-CM

## 2014-08-19 DIAGNOSIS — I480 Paroxysmal atrial fibrillation: Secondary | ICD-10-CM

## 2014-08-19 LAB — HEPATIC FUNCTION PANEL
ALBUMIN: 3.9 g/dL (ref 3.5–5.2)
ALT: 30 U/L (ref 0–53)
AST: 36 U/L (ref 0–37)
Alkaline Phosphatase: 72 U/L (ref 39–117)
Bilirubin, Direct: 0.1 mg/dL (ref 0.0–0.3)
TOTAL PROTEIN: 7 g/dL (ref 6.0–8.3)
Total Bilirubin: 0.9 mg/dL (ref 0.2–1.2)

## 2014-08-20 ENCOUNTER — Other Ambulatory Visit: Payer: Self-pay | Admitting: Cardiology

## 2014-08-22 ENCOUNTER — Telehealth: Payer: Self-pay | Admitting: Cardiology

## 2014-08-22 NOTE — Telephone Encounter (Signed)
Advised patient of lab results  

## 2014-08-22 NOTE — Telephone Encounter (Signed)
New message    Wife calling  - calling for test results.

## 2014-08-22 NOTE — Telephone Encounter (Signed)
Message copied by Earvin Hansen on Thu Aug 22, 2014  1:48 PM ------      Message from: Larey Dresser      Created: Wed Aug 21, 2014 10:51 PM       normal ------

## 2014-09-10 ENCOUNTER — Other Ambulatory Visit: Payer: Self-pay | Admitting: Cardiology

## 2014-09-11 ENCOUNTER — Other Ambulatory Visit: Payer: Self-pay

## 2014-10-07 ENCOUNTER — Other Ambulatory Visit: Payer: Self-pay | Admitting: Cardiology

## 2014-10-15 ENCOUNTER — Other Ambulatory Visit: Payer: Self-pay | Admitting: Cardiology

## 2014-11-01 DIAGNOSIS — I2510 Atherosclerotic heart disease of native coronary artery without angina pectoris: Secondary | ICD-10-CM | POA: Diagnosis not present

## 2014-11-01 DIAGNOSIS — Z79899 Other long term (current) drug therapy: Secondary | ICD-10-CM | POA: Diagnosis not present

## 2014-11-01 DIAGNOSIS — I208 Other forms of angina pectoris: Secondary | ICD-10-CM | POA: Diagnosis not present

## 2014-11-01 DIAGNOSIS — I4891 Unspecified atrial fibrillation: Secondary | ICD-10-CM | POA: Diagnosis not present

## 2014-11-05 ENCOUNTER — Other Ambulatory Visit: Payer: Self-pay | Admitting: Cardiology

## 2014-11-07 DIAGNOSIS — Z23 Encounter for immunization: Secondary | ICD-10-CM | POA: Diagnosis not present

## 2014-11-11 ENCOUNTER — Encounter: Payer: Self-pay | Admitting: *Deleted

## 2014-11-13 DIAGNOSIS — I4891 Unspecified atrial fibrillation: Secondary | ICD-10-CM | POA: Diagnosis not present

## 2014-11-14 ENCOUNTER — Ambulatory Visit (INDEPENDENT_AMBULATORY_CARE_PROVIDER_SITE_OTHER): Payer: Medicare Other | Admitting: Cardiology

## 2014-11-14 ENCOUNTER — Encounter: Payer: Self-pay | Admitting: *Deleted

## 2014-11-14 ENCOUNTER — Encounter: Payer: Self-pay | Admitting: Cardiology

## 2014-11-14 VITALS — BP 120/60 | HR 70 | Ht 66.0 in | Wt 140.0 lb

## 2014-11-14 DIAGNOSIS — I480 Paroxysmal atrial fibrillation: Secondary | ICD-10-CM

## 2014-11-14 DIAGNOSIS — R079 Chest pain, unspecified: Secondary | ICD-10-CM | POA: Diagnosis not present

## 2014-11-14 DIAGNOSIS — R0789 Other chest pain: Secondary | ICD-10-CM

## 2014-11-14 DIAGNOSIS — R0602 Shortness of breath: Secondary | ICD-10-CM | POA: Diagnosis not present

## 2014-11-14 DIAGNOSIS — I2510 Atherosclerotic heart disease of native coronary artery without angina pectoris: Secondary | ICD-10-CM

## 2014-11-14 LAB — BASIC METABOLIC PANEL
BUN: 19 mg/dL (ref 6–23)
CHLORIDE: 104 meq/L (ref 96–112)
CO2: 31 mEq/L (ref 19–32)
Calcium: 9.9 mg/dL (ref 8.4–10.5)
Creatinine, Ser: 1.1 mg/dL (ref 0.4–1.5)
GFR: 69.82 mL/min (ref >60.00)
Glucose, Bld: 65 mg/dL — ABNORMAL LOW (ref 70–99)
Potassium: 4.2 mEq/L (ref 3.5–5.1)
Sodium: 139 mEq/L (ref 135–145)

## 2014-11-14 LAB — MAGNESIUM: MAGNESIUM: 2.2 mg/dL (ref 1.5–2.5)

## 2014-11-14 LAB — BRAIN NATRIURETIC PEPTIDE: PRO B NATRI PEPTIDE: 227 pg/mL — AB (ref 0.0–100.0)

## 2014-11-14 NOTE — Patient Instructions (Signed)
Your physician recommends that you return for lab work today--BMET/BNP/Magnesium level.  Your physician has requested that you have a lexiscan myoview. For further information please visit [email or web address]. Please follow instruction sheet, as given.  Your physician recommends that you schedule a follow-up appointment in: 2 months with Dr Aundra Dubin.

## 2014-11-14 NOTE — Progress Notes (Signed)
Patient ID: [name], male   DOB: 1936-04-23, 78 y.o.   MRN: [id number] PCP: [name]  78 yo with history of CAD s/p CABG presents for cardiology followup.  His last cath was in 11/09.  The SVG-distal RCA was patent, the CFX system was patent.  His LIMA was atretic and there were serial 60% and 80% stenoses in the native LAD.  He was managed medically.  Echo in 8/14 showed EF 55-60% with moderate MR and moderate TR.   He was initially noted to have atrial fibrillation in the summer of 2014. He was started on Xarelto and cardioverted to NSR in 8/14.  Recurrent atrial fibrillation was noted in 1/15, and he was cardioverted to NSR again.  This time, NSR did not hold long.  At last appointment in 5/15, he was in persistent atrial fibrillation.  I referred him to [facility] where he had atrial fibrillation ablation and Tikosyn loading.  Ranolazine was stopped due to risk of QT prolongation and Imdur was started as an anti-anginal instead.  Patient is in NSR today. No recent palpitations.  He is currently wearing a 14 day monitor from [facility] to detect any PAF.  He has been noting exertional dyspnea when he walks up an incline or up more than 1 flight of steps.  He has had 2 episodes of mild chest tightness in the last month when walking up a hill.  No orthopnea or PND, no lightheadedness.   ECG: NSR, QTc 466 msec  Labs (8/12): K 4.1, creatinine 1.2, LDL 91, HDL 53 Labs (8/14): K 4.6, creatinine 1.1 Labs (11/14): K 4.6, creatinine 1.2, LDL 91, HDL 56 Labs (3/15): AST 38, ALT 32, TSH normal, BNP 237 Labs (7/15): LDL 96, HDL 48, K 4.2, creatinine 1.2, TSH normal, BNP 237, AST 38, ALT 32 Labs (8/15): LFTs normal  PMH: 1. CAD: 1st MI in 1988.  CABG 1996.  PCI to CFX in 2009.  LHC (11/09) SVG-dRCA patent, total occlusion RCA, patent CFX stent, atretic LIMA, serial 60 and 80% proximal LAD stenoses, EF 60% with basal inferior hypokinesis.  Myoview in 2011 with no ischemia or infarction.  Echo (10/12) with EF  55-60%, mild LVH, mild MR.  Lexiscan Cardiolite in 2013 with no ischemia or infarction.  Echo (8/14) with EF 55-60%, moderate MR, moderate TR, PA systolic pressure 35 mmHg.  Echo (4/15) with EF 60-65%, mild focal basal septal hypertrophy, inferior basal akinesis, mild MR.  2. Raynauds 3. Post-polio syndrome 4. GERD with dilation of esophageal stricture in 12/12.  5. Hyperlipidemia 6. Brainstem stroke with Horner's syndrome.  7. Scoliosis. 8. H/o appendectomy 9. Herpes Zoster 10. Atrial fibrillation: DCCV to NSR in 8/14. DCCV to NSR in 1/15. Atrial fibrillation ablation 6/15 with Tikosyn loading (at [facility]).   11. PFTs (4/15) with FVC 59%, FEV1 54%, ratio 91%, DLCO 53% => moderate obstructive defect thought to be related to COPD and severe restrictive defect thought to be due to elevated left hemidiaphragm and post-polio syndrome.   SH: quit tobacco while in his 16s.  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   FH: Brother with CAD, heart transplant.   ROS: All systems reviewed and negative except as per HPI.   Current Outpatient Prescriptions  Medication Sig Dispense Refill  . acetaminophen (TYLENOL) 325 MG tablet Take 325 mg by mouth every 4 (four) hours as needed for pain.    [name] albuterol (PROAIR HFA) 108 (90 BASE) MCG/ACT inhaler Inhale 2 puffs into the lungs every 4 (  four) hours as needed for wheezing or shortness of breath (and prior to exercise). 1 Inhaler 3  . BYSTOLIC 10 MG tablet TAKE 1 TABLET EVERY DAY 30 tablet 2  . cetirizine (ZYRTEC) 10 MG tablet Take 10 mg by mouth.    . dofetilide (TIKOSYN) 250 MCG capsule Take 250 mcg by mouth every 12 (twelve) hours.    . isosorbide mononitrate (IMDUR) 60 MG 24 hr tablet Take 60 mg by mouth daily.  4  . lactobacillus acidophilus & bulgar (LACTINEX) chewable tablet Chew 2 tablets by mouth daily.     [name] LORazepam (ATIVAN) 0.5 MG tablet Take 0.5 mg by mouth at bedtime as needed.     . magnesium oxide (MAG-OX) 400 MG tablet Take 400 mg by mouth  daily.      [name] PROLENSA 0.07 % SOLN   5  . ranitidine (ZANTAC) 300 MG tablet TAKE 1 TABLET BY MOUTH EVERY DAY 30 tablet 3  . rosuvastatin (CRESTOR) 40 MG tablet TAKE 1 TABLET BY MOUTH ONCE DAILY    . traZODone (DESYREL) 50 MG tablet Take 50 mg by mouth at bedtime.    . Vitamin D, Ergocalciferol, (DRISDOL) 50000 UNITS CAPS capsule Take 50,000 Units by mouth as directed. Pt taking twice a month    . XARELTO 20 MG TABS tablet TAKE 1 TABLET BY MOUTH ONCE DAILY 30 tablet 11   No current facility-administered medications for this visit.    BP 130/68  Pulse 66  Ht 5\' 6"  (1.676 m)  Wt 60.328 kg (133 lb)  BMI 21.47 kg/m2 General: NAD Neck: No JVD, no thyromegaly or thyroid nodule.  Lungs: Decreased breath sounds left base. CV: Nondisplaced PMI.  Heart regular S1/S2, no S3/S4, 1/6 HSM at apex.  No edema.  No carotid bruit.  Normal pedal pulses.  Abdomen: Soft, nontender, no hepatosplenomegaly, no distention.  Neurologic: Alert and oriented x 3.  Psych: Normal affect.  Assessment/Plan: 1. Atrial fibrillation: He is now in NSR on Tikosyn s/p atrial fibrillation ablation at [facility] on 06/25/14.   - Continue Tikosyn.  QTc normal today.  Check BMET and Mg today.  - Continue Xarelto and nebivolol.  2. CAD: He is not taking aspirin as he has stable CAD and is on Xarelto.  Continue nebivolol and Crestor.  He has had 2 episodes of mild exertional chest pain in the last month.  He also has mild exertional dyspnea.  He does not appear volume overloaded on exam.  I will get Lexiscan-Cardiolite for risk stratification.  Back pain limits walking on the treadmill.  3. Hyperlipidemia: LDL higher than ideal (goal < 70).  Will repeat lipids at 6 months in 1/16.  Would consider addition of Zetia if LDL is still above goal.  He is taking Crestor 40 mg daily.    4. Chronic diastolic CHF: He has mild exertional dyspnea but he does not look volume overloaded today.  He is not using Lasix.  I will check BNP.  5. Pulmonary:  PFTs suggested both a restrictive and obstructive defect.  The restrictive defect is likely from post-polio syndrome and elevated left hemidiaphragm.  Mr Tapley never smoked much, so the obstructive defect is more surprising.  His mild exertional dyspnea may be lung-related.    [name] 11/14/2014

## 2014-11-15 ENCOUNTER — Ambulatory Visit (HOSPITAL_COMMUNITY): Payer: Medicare Other | Attending: Cardiology | Admitting: Radiology

## 2014-11-15 DIAGNOSIS — R0789 Other chest pain: Secondary | ICD-10-CM | POA: Insufficient documentation

## 2014-11-15 DIAGNOSIS — I2510 Atherosclerotic heart disease of native coronary artery without angina pectoris: Secondary | ICD-10-CM | POA: Insufficient documentation

## 2014-11-15 DIAGNOSIS — R0602 Shortness of breath: Secondary | ICD-10-CM | POA: Diagnosis not present

## 2014-11-15 DIAGNOSIS — R002 Palpitations: Secondary | ICD-10-CM | POA: Diagnosis not present

## 2014-11-15 DIAGNOSIS — I10 Essential (primary) hypertension: Secondary | ICD-10-CM | POA: Insufficient documentation

## 2014-11-15 MED ORDER — TECHNETIUM TC 99M SESTAMIBI GENERIC - CARDIOLITE
30.0000 | Freq: Once | INTRAVENOUS | Status: AC | PRN
  Administered 2014-11-15: 30 via INTRAVENOUS

## 2014-11-15 MED ORDER — TECHNETIUM TC 99M SESTAMIBI GENERIC - CARDIOLITE
10.0000 | Freq: Once | INTRAVENOUS | Status: AC | PRN
  Administered 2014-11-15: 10 via INTRAVENOUS

## 2014-11-15 MED ORDER — REGADENOSON 0.4 MG/5ML IV SOLN
0.4000 mg | Freq: Once | INTRAVENOUS | Status: AC
Start: 2014-11-15 — End: 2014-11-15
  Administered 2014-11-15: 0.4 mg via INTRAVENOUS

## 2014-11-15 NOTE — Progress Notes (Addendum)
[facility] [place], [place] [address] [phone]    Cardiology Nuclear Med Study  [name] is a 78 y.o. male     MRN : [id number]     DOB: 1936/03/19  Procedure Date: 11/15/2014  Nuclear Med Background Indication for Stress Test:  Evaluation for Ischemia and follow up CAd History:  CAD; 2013 MPI-nml, EF 66%; COPD; AFib Cardiac Risk Factors: Carotid Disease, CVA and Hypertension  Symptoms:  Chest Pressure and rest and with exertion (last date of chest discomfort 1 week ago), Palpitations and SOB   Nuclear Pre-Procedure Caffeine/Decaff Intake:  None NPO After: 10:30pm   Lungs:  clear O2 Sat: 98% on room air. IV 0.9% NS with Angio Cath:  22g  IV Site: L Hand  IV Started by:  Ileene Hutchinson, EMT-P  Chest Size (in):  34 Cup Size: n/a  Height: 5\' 6"  (1.676 m)  Weight:  137 lb (62.143 kg)  BMI:  Body mass index is 22.12 kg/(m^2). Tech Comments:  n/a    Nuclear Med Study 1 or 2 day study: 1 day  Stress Test Type:  Lexiscan  Reading MD: Candee Furbish, MD  Order Authorizing Provider:  Einar Crow, MD  Resting Radionuclide: Technetium 33m Sestamibi  Resting Radionuclide Dose: 11.0 mCi   Stress Radionuclide:  Technetium 51m Sestamibi  Stress Radionuclide Dose: 33.0 mCi           Stress Protocol Rest HR: 55 Stress HR: 69  Rest BP: 102/56 Stress BP: 112/53  Exercise Time (min): n/a METS: n/a   Predicted Max HR: 144 bpm % Max HR: 47.92 bpm Rate Pressure Product: 7866   Dose of Adenosine (mg):  n/a Dose of Lexiscan: 0.4 mg  Dose of Atropine (mg): n/a Dose of Dobutamine: n/a mcg/kg/min (at max HR)  Stress Test Technologist: Glade Lloyd, BS-ES  Nuclear Technologist:  Earl Many, CNMT     Rest Procedure:  Myocardial perfusion imaging was performed at rest 45 minutes following the intravenous administration of Technetium 69m Sestamibi. Rest ECG: Sinus rhythm, PACs, PVC, low amplitude P wave noted.  Stress Procedure:  The  patient received IV Lexiscan 0.4 mg over 15-seconds.  Technetium 22m Sestamibi injected at 30-seconds.  Quantitative spect images were obtained after a 45 minute delay.  During the  Infusion of Lexiscan the patient complained of SOB, cough, lightheadedness, chest pressure, headache and full stomach.  These symptoms began to resolve in recovery.  Stress ECG: No significant change from baseline ECG  QPS Raw Data Images:  Normal; no motion artifact; normal heart/lung ratio. Stress Images:  There is a basal or inferoseptal fixed defect seen at both rest and stress, possible infarct with otherwise homogeneous radiotracer uptake. Rest Images:  As described above Subtraction (SDS):  No evidence of ischemia. Transient Ischemic Dilatation (Normal <1.22):  1.11 Lung/Heart Ratio (Normal <0.45):  0.31  Quantitative Gated Spect Images QGS EDV:  66 ml QGS ESV:  26 ml  Impression Exercise Capacity:  Lexiscan with no exercise. BP Response:  Normal blood pressure response Clinical Symptoms:  Typical symptoms with Lexiscan. ECG Impression:  No significant ST segment change suggestive of ischemia. Comparison with Prior Nuclear Study: No images to compare  Overall Impression:  Low risk stress nuclear study with no evidence of ischemia. [name] basal inferoseptal fixed defect, possible infarct.  LV Ejection Fraction: 61%.  LV Wall Motion:  Septal wall hypokinesis  SKAINS, MARK, MD   Low risk study.  No ischemia.  Fixed basal inferoseptal defect (small) probably prior MI.  No indication for cath at this point.  [name] 11/18/2014

## 2014-11-18 NOTE — Progress Notes (Signed)
The patient is aware of his results. 

## 2014-12-02 ENCOUNTER — Other Ambulatory Visit: Payer: Self-pay | Admitting: Cardiology

## 2014-12-05 ENCOUNTER — Ambulatory Visit: Payer: Medicare Other | Admitting: Cardiology

## 2015-01-09 ENCOUNTER — Telehealth: Payer: Self-pay | Admitting: Cardiology

## 2015-01-09 NOTE — Telephone Encounter (Signed)
Xarelto approved through 01/10/16 -PA #[id number] I spoke with Celeste at [place] [phone]. Pt's wife advised.

## 2015-01-09 NOTE — Telephone Encounter (Signed)
New Message     Patients wife called and need prior authorization for a medication (xarelto 20mg )  Please call   Thanks

## 2015-01-27 ENCOUNTER — Ambulatory Visit (INDEPENDENT_AMBULATORY_CARE_PROVIDER_SITE_OTHER): Payer: Medicare Other | Admitting: Cardiology

## 2015-01-27 ENCOUNTER — Other Ambulatory Visit: Payer: Self-pay | Admitting: *Deleted

## 2015-01-27 ENCOUNTER — Encounter: Payer: Self-pay | Admitting: Cardiology

## 2015-01-27 VITALS — BP 120/70 | HR 87 | Ht 66.0 in | Wt 142.0 lb

## 2015-01-27 DIAGNOSIS — R0602 Shortness of breath: Secondary | ICD-10-CM

## 2015-01-27 DIAGNOSIS — I5032 Chronic diastolic (congestive) heart failure: Secondary | ICD-10-CM

## 2015-01-27 DIAGNOSIS — I2510 Atherosclerotic heart disease of native coronary artery without angina pectoris: Secondary | ICD-10-CM | POA: Diagnosis not present

## 2015-01-27 DIAGNOSIS — E785 Hyperlipidemia, unspecified: Secondary | ICD-10-CM

## 2015-01-27 DIAGNOSIS — I480 Paroxysmal atrial fibrillation: Secondary | ICD-10-CM | POA: Diagnosis not present

## 2015-01-27 MED ORDER — FUROSEMIDE 20 MG PO TABS: ORAL_TABLET | ORAL | Status: DC

## 2015-01-27 MED ORDER — POTASSIUM CHLORIDE CRYS ER 20 MEQ PO TBCR: EXTENDED_RELEASE_TABLET | ORAL | Status: DC

## 2015-01-27 NOTE — Progress Notes (Signed)
Patient ID: [name], male   DOB: 08-13-1936, 79 y.o.   MRN: [id number] PCP: [name]  79 yo with history of CAD s/p CABG presents for cardiology followup.  His last cath was in 11/09.  The SVG-distal RCA was patent, the CFX system was patent.  His LIMA was atretic and there were serial 60% and 80% stenoses in the native LAD.  He was managed medically.  Echo in 8/14 showed EF 55-60% with moderate MR and moderate TR.   He was initially noted to have atrial fibrillation in the summer of 2014. He was started on Xarelto and cardioverted to NSR in 8/14.  Recurrent atrial fibrillation was noted in 1/15, and he was cardioverted to NSR again.  This time, NSR did not hold long.  At last appointment in 5/15, he was in persistent atrial fibrillation.  I referred him to [facility] where he had atrial fibrillation ablation and Tikosyn loading.  Ranolazine was stopped due to risk of QT prolongation and Imdur was started as an anti-anginal instead.  Patient is in NSR today. No recent palpitations.  He has been noting exertional dyspnea when he walks fast for 50 yards, up an incline or up more than 1 flight of steps.  No orthopnea or PND, no lightheadedness. No chest pain.  Lexiscan Cardiolite in 11/15 showed no ischemia.  Weight is up 5 lbs.   ECG: NSR, inferior T wave inversions, QTc 486 msec  Labs (8/12): K 4.1, creatinine 1.2, LDL 91, HDL 53 Labs (8/14): K 4.6, creatinine 1.1 Labs (11/14): K 4.6, creatinine 1.2, LDL 91, HDL 56 Labs (3/15): AST 38, ALT 32, TSH normal, BNP 237 Labs (7/15): LDL 96, HDL 48, K 4.2, creatinine 1.2, TSH normal, BNP 237, AST 38, ALT 32 Labs (8/15): LFTs normal Labs (11/15): K 4.2, creatinine 1.1, Mg 2.2, BNP 227  PMH: 1. CAD: 1st MI in 1988.  CABG 1996.  PCI to CFX in 2009.  LHC (11/09) SVG-dRCA patent, total occlusion RCA, patent CFX stent, atretic LIMA, serial 60 and 80% proximal LAD stenoses, EF 60% with basal inferior hypokinesis.  Myoview in 2011 with no ischemia or infarction.   Echo (10/12) with EF 55-60%, mild LVH, mild MR.  Lexiscan Cardiolite in 2013 with no ischemia or infarction.  Echo (8/14) with EF 55-60%, moderate MR, moderate TR, PA systolic pressure 35 mmHg.  Echo (4/15) with EF 60-65%, mild focal basal septal hypertrophy, inferior basal akinesis, mild MR.  Lexiscan cardiolite (11/15) with EF 61%, fixed basal inferoseptal defect, no ischemia.  2. Raynauds 3. Post-polio syndrome 4. GERD with dilation of esophageal stricture in 12/12.  5. Hyperlipidemia 6. Brainstem stroke with Horner's syndrome.  7. Scoliosis. 8. H/o appendectomy 9. Herpes Zoster 10. Atrial fibrillation: DCCV to NSR in 8/14. DCCV to NSR in 1/15. Atrial fibrillation ablation 6/15 with Tikosyn loading (at [facility]).   11. PFTs (4/15) with FVC 59%, FEV1 54%, ratio 91%, DLCO 53% => moderate obstructive defect thought to be related to COPD and severe restrictive defect thought to be due to elevated left hemidiaphragm and post-polio syndrome.  12. Chronic diastolic CHF.   SH: quit tobacco while in his 10s.  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   ROS: All systems reviewed and negative except as per HPI.   Current Outpatient Prescriptions  Medication Sig Dispense Refill  . acetaminophen (TYLENOL) 325 MG tablet Take 325 mg by mouth every 4 (four) hours as needed for pain.    [name]  albuterol (PROAIR HFA) 108 (90 BASE) MCG/ACT inhaler Inhale 2 puffs into the lungs every 4 (four) hours as needed for wheezing or shortness of breath (and prior to exercise). 1 Inhaler 3  . BYSTOLIC 10 MG tablet TAKE 1 TABLET EVERY DAY 30 tablet 2  . cetirizine (ZYRTEC) 10 MG tablet Take 10 mg by mouth.    . isosorbide mononitrate (IMDUR) 60 MG 24 hr tablet Take 60 mg by mouth daily.  4  . lactobacillus acidophilus & bulgar (LACTINEX) chewable tablet Chew 2 tablets by mouth daily.     [name] LORazepam (ATIVAN) 0.5 MG tablet Take 0.5 mg by mouth at bedtime as needed.     . magnesium  oxide (MAG-OX) 400 MG tablet Take 400 mg by mouth daily.      [name] PROLENSA 0.07 % SOLN   5  . ranitidine (ZANTAC) 300 MG tablet TAKE 1 TABLET BY MOUTH EVERY DAY 30 tablet 3  . rosuvastatin (CRESTOR) 40 MG tablet TAKE 1 TABLET BY MOUTH ONCE DAILY    . TIKOSYN 250 MCG capsule TAKE ONE CAPSULE EVERY 12 HOURS 60 capsule 4  . traZODone (DESYREL) 50 MG tablet Take 50 mg by mouth at bedtime.    . Vitamin D, Ergocalciferol, (DRISDOL) 50000 UNITS CAPS capsule Take 50,000 Units by mouth as directed. Pt taking twice a month    . XARELTO 20 MG TABS tablet TAKE 1 TABLET BY MOUTH ONCE DAILY 30 tablet 11  . furosemide (LASIX) 20 MG tablet 1 tablet by mouth daily  for 2 weeks, then 1 tablet  by mouth  every other day. 30 tablet 6  . potassium chloride SA (K-DUR,KLOR-CON) 20 MEQ tablet 1 tablet by mouth daily for 2 weeks, then 1 tablet every other day 30 tablet 6   No current facility-administered medications for this visit.    BP 130/68  Pulse 66  Ht 5\' 6"  (1.676 m)  Wt 60.328 kg (133 lb)  BMI 21.47 kg/m2 General: NAD Neck: JVP 8-9 cm, no thyromegaly or thyroid nodule.  Lungs: Decreased breath sounds left base. CV: Nondisplaced PMI.  Heart regular S1/S2, no S3/S4, 1/6 HSM at apex.  1+ edema 1/2 up lower legs bilaterally.  No carotid bruit.  Normal pedal pulses.  Abdomen: Soft, nontender, no hepatosplenomegaly, no distention.  Neurologic: Alert and oriented x 3.  Psych: Normal affect.  Assessment/Plan: 1. Atrial fibrillation: He is now in NSR on Tikosyn s/p atrial fibrillation ablation at [facility] on 06/25/14.   - Continue Tikosyn.  QTc acceptable today.    - Continue Xarelto and nebivolol.  2. CAD: He is not taking aspirin as he has stable CAD and is on Xarelto.  Continue nebivolol and Crestor.  Recent Cardiolite showed no ischemia.   3. Hyperlipidemia: Check lipids.  Would consider addition of Zetia if LDL is still above goal.  He is taking Crestor 40 mg daily.    4. Chronic diastolic CHF: NYHA class II  symptoms with mild volume overload on exam.  - Start Lasix 20 mg daily with KCl 20 daily.  After 2 wks, he can try decreasing Lasix to every other day.  - BMET/BNP/Mg level in 2 wks.  5. Pulmonary: PFTs suggested both a restrictive and obstructive defect.  The restrictive defect is likely from post-polio syndrome and elevated left hemidiaphragm.  Mr Jackson never smoked much, so the obstructive defect is more surprising.    Followup in 1 month    [name] 01/27/2015

## 2015-01-27 NOTE — Patient Instructions (Signed)
Start lasix 20mg  daily for 2 weeks, then decrease to lasix 20mg  every other day.  Start KCL(potassium) 20 mEq daily for 2 weeks, then decrease to KCL (potassium) 20 mEq every other day.  Your physician recommends that you return for lab work in: 10 days--BMET/BNP/Magnesium level.  Your physician recommends that you schedule a follow-up appointment in: 1 month with Dr Aundra Dubin.

## 2015-01-28 DIAGNOSIS — I5033 Acute on chronic diastolic (congestive) heart failure: Secondary | ICD-10-CM | POA: Insufficient documentation

## 2015-01-30 DIAGNOSIS — E785 Hyperlipidemia, unspecified: Secondary | ICD-10-CM | POA: Diagnosis not present

## 2015-01-30 DIAGNOSIS — E291 Testicular hypofunction: Secondary | ICD-10-CM | POA: Diagnosis not present

## 2015-01-30 DIAGNOSIS — I10 Essential (primary) hypertension: Secondary | ICD-10-CM | POA: Diagnosis not present

## 2015-01-31 DIAGNOSIS — Z0000 Encounter for general adult medical examination without abnormal findings: Secondary | ICD-10-CM | POA: Diagnosis not present

## 2015-01-31 DIAGNOSIS — E291 Testicular hypofunction: Secondary | ICD-10-CM | POA: Diagnosis not present

## 2015-01-31 DIAGNOSIS — Z6822 Body mass index (BMI) 22.0-22.9, adult: Secondary | ICD-10-CM | POA: Diagnosis not present

## 2015-01-31 DIAGNOSIS — M8580 Other specified disorders of bone density and structure, unspecified site: Secondary | ICD-10-CM | POA: Diagnosis not present

## 2015-01-31 DIAGNOSIS — Z125 Encounter for screening for malignant neoplasm of prostate: Secondary | ICD-10-CM | POA: Diagnosis not present

## 2015-01-31 DIAGNOSIS — I499 Cardiac arrhythmia, unspecified: Secondary | ICD-10-CM | POA: Diagnosis not present

## 2015-01-31 DIAGNOSIS — Z008 Encounter for other general examination: Secondary | ICD-10-CM | POA: Diagnosis not present

## 2015-02-05 ENCOUNTER — Other Ambulatory Visit: Payer: Self-pay | Admitting: Cardiology

## 2015-02-06 ENCOUNTER — Other Ambulatory Visit: Payer: Medicare Other

## 2015-02-07 ENCOUNTER — Other Ambulatory Visit (INDEPENDENT_AMBULATORY_CARE_PROVIDER_SITE_OTHER): Payer: Medicare Other | Admitting: *Deleted

## 2015-02-07 ENCOUNTER — Telehealth: Payer: Self-pay | Admitting: Cardiology

## 2015-02-07 DIAGNOSIS — I480 Paroxysmal atrial fibrillation: Secondary | ICD-10-CM | POA: Diagnosis not present

## 2015-02-07 DIAGNOSIS — R0602 Shortness of breath: Secondary | ICD-10-CM | POA: Diagnosis not present

## 2015-02-07 DIAGNOSIS — I2510 Atherosclerotic heart disease of native coronary artery without angina pectoris: Secondary | ICD-10-CM

## 2015-02-07 LAB — BASIC METABOLIC PANEL
BUN: 17 mg/dL (ref 6–23)
CALCIUM: 9.7 mg/dL (ref 8.4–10.5)
CO2: 33 mEq/L — ABNORMAL HIGH (ref 19–32)
Chloride: 101 mEq/L (ref 96–112)
Creatinine, Ser: 1.32 mg/dL (ref 0.40–1.50)
GFR: 55.94 mL/min — ABNORMAL LOW (ref >60.00)
Glucose, Bld: 143 mg/dL — ABNORMAL HIGH (ref 70–99)
Potassium: 4 mEq/L (ref 3.5–5.1)
SODIUM: 137 meq/L (ref 135–145)

## 2015-02-07 LAB — MAGNESIUM: MAGNESIUM: 2.2 mg/dL (ref 1.5–2.5)

## 2015-02-07 LAB — BRAIN NATRIURETIC PEPTIDE: Pro B Natriuretic peptide (BNP): 261 pg/mL — ABNORMAL HIGH (ref 0.0–100.0)

## 2015-02-07 NOTE — Telephone Encounter (Signed)
Needs CTA chest with contrast to rule out pulmonary vein stenosis.

## 2015-02-07 NOTE — Telephone Encounter (Signed)
Spoke with patient's wife who states Dr. Lehman Prom at [facility] advised that patient have some type of follow-up test and that test could be done in [place].  Wife states Dr. Lehman Prom was going to contact [name] to order test.  I advised wife that I do not see any communication regarding this test in patient's chart and that I will route message to [name] to determine if he has received anything from Dr. Lehman Prom.  I advised wife that I will call her back - she advised Dr. Lehman Prom is usually in the office on Fridays and today would be a good day to call to get information from him.

## 2015-02-07 NOTE — Telephone Encounter (Signed)
Spoke with patient's wife and advised of Dr. Claris Gladden order to order CTA and have scheduled patient for 2/19 at 1:15 pm.  Patient's wife verbalized understanding and agreement and is aware that lab work collected today is all patient needs prior to test.

## 2015-02-07 NOTE — Telephone Encounter (Signed)
New Msg        Pt wife calling, states pt recently had an ablation and was informed he needed to have an additional heart test due to concerns with a CT.   Please return call.

## 2015-02-07 NOTE — Addendum Note (Signed)
Addended by: Eulis Foster on: 02/07/2015 10:04 AM   Modules accepted: Orders

## 2015-02-13 DIAGNOSIS — Z0000 Encounter for general adult medical examination without abnormal findings: Secondary | ICD-10-CM | POA: Diagnosis not present

## 2015-02-13 DIAGNOSIS — Z23 Encounter for immunization: Secondary | ICD-10-CM | POA: Diagnosis not present

## 2015-02-13 DIAGNOSIS — E785 Hyperlipidemia, unspecified: Secondary | ICD-10-CM | POA: Diagnosis not present

## 2015-02-13 DIAGNOSIS — M859 Disorder of bone density and structure, unspecified: Secondary | ICD-10-CM | POA: Diagnosis not present

## 2015-02-13 DIAGNOSIS — Z6822 Body mass index (BMI) 22.0-22.9, adult: Secondary | ICD-10-CM | POA: Diagnosis not present

## 2015-02-13 DIAGNOSIS — Z125 Encounter for screening for malignant neoplasm of prostate: Secondary | ICD-10-CM | POA: Diagnosis not present

## 2015-02-13 DIAGNOSIS — I2510 Atherosclerotic heart disease of native coronary artery without angina pectoris: Secondary | ICD-10-CM | POA: Diagnosis not present

## 2015-02-13 DIAGNOSIS — I10 Essential (primary) hypertension: Secondary | ICD-10-CM | POA: Diagnosis not present

## 2015-02-13 DIAGNOSIS — Z1389 Encounter for screening for other disorder: Secondary | ICD-10-CM | POA: Diagnosis not present

## 2015-02-13 DIAGNOSIS — I480 Paroxysmal atrial fibrillation: Secondary | ICD-10-CM | POA: Diagnosis not present

## 2015-02-13 DIAGNOSIS — E291 Testicular hypofunction: Secondary | ICD-10-CM | POA: Diagnosis not present

## 2015-02-14 ENCOUNTER — Ambulatory Visit (INDEPENDENT_AMBULATORY_CARE_PROVIDER_SITE_OTHER)
Admission: RE | Admit: 2015-02-14 | Discharge: 2015-02-14 | Disposition: A | Discharge status: Home / Self Care | Payer: Medicare Other | Source: Ambulatory Visit | Attending: Cardiology | Admitting: Cardiology

## 2015-02-14 DIAGNOSIS — I288 Other diseases of pulmonary vessels: Secondary | ICD-10-CM | POA: Diagnosis not present

## 2015-02-14 DIAGNOSIS — I2510 Atherosclerotic heart disease of native coronary artery without angina pectoris: Secondary | ICD-10-CM | POA: Diagnosis not present

## 2015-02-14 MED ORDER — IOHEXOL 350 MG/ML SOLN
100.0000 mL | Freq: Once | INTRAVENOUS | Status: AC | PRN
  Administered 2015-02-14: 100 mL via INTRAVENOUS

## 2015-02-24 ENCOUNTER — Other Ambulatory Visit: Payer: Self-pay | Admitting: Cardiology

## 2015-03-03 ENCOUNTER — Encounter: Payer: Self-pay | Admitting: Cardiology

## 2015-03-03 ENCOUNTER — Ambulatory Visit (INDEPENDENT_AMBULATORY_CARE_PROVIDER_SITE_OTHER): Payer: Medicare Other | Admitting: Cardiology

## 2015-03-03 VITALS — BP 126/74 | HR 84 | Ht 66.0 in | Wt 146.0 lb

## 2015-03-03 DIAGNOSIS — I5032 Chronic diastolic (congestive) heart failure: Secondary | ICD-10-CM

## 2015-03-03 DIAGNOSIS — I480 Paroxysmal atrial fibrillation: Secondary | ICD-10-CM

## 2015-03-03 DIAGNOSIS — I2510 Atherosclerotic heart disease of native coronary artery without angina pectoris: Secondary | ICD-10-CM

## 2015-03-03 MED ORDER — FUROSEMIDE 40 MG PO TABS: ORAL_TABLET | ORAL | Status: DC

## 2015-03-03 MED ORDER — POTASSIUM CHLORIDE CRYS ER 20 MEQ PO TBCR: EXTENDED_RELEASE_TABLET | ORAL | Status: DC

## 2015-03-03 NOTE — Patient Instructions (Signed)
Increase lasix to 40mg  alternating with 20mg  daily.  This will be 1 of a 40mg  tablet alternating with 1/2 of a 40mg  tablet daily.  Increase KCL (potassium) to 40 mEq alternating with 20 mEq daily. This will be 2 of a 20 mEq tablet alternating with 1 of a 20 mEq tablet daily.  Take 2 KCL(potassium) on the days you take lasix 40mg  and take 1 KCL(potassium) on the days you take lasix 20mg .   Your physician recommends that you return for lab work in: 2 weeks--BMET.  Your physician recommends that you schedule a follow-up appointment in about 1 month with Dr Aundra Dubin.

## 2015-03-04 NOTE — Progress Notes (Signed)
Patient ID: [name], male   DOB: [birth year], 79 y.o.   MRN: [id number] PCP: Jimmy. Reynaldo Weiss  79 yo with history of CAD s/p CABG presents for cardiology followup.  His last cath was in 11/09.  The SVG-distal RCA was patent, the CFX system was patent.  His LIMA was atretic and there were serial 60% and 80% stenoses in the native LAD.  He was managed medically.  Echo in 8/14 showed EF 55-60% with moderate MR and moderate TR.   He was initially noted to have atrial fibrillation in the summer of 2014. He was started on Xarelto and cardioverted to NSR in 8/14.  Recurrent atrial fibrillation was noted in 1/15, and he was cardioverted to NSR again.  This time, NSR did not hold long.  At last appointment in 5/15, he was in persistent atrial fibrillation.  I referred him to [name] where he had atrial fibrillation ablation and Tikosyn loading.  Ranolazine was stopped due to risk of QT prolongation and Imdur was started as an anti-anginal instead.  Patient is in NSR today. No recent palpitations.  At last appointment, I started him on low dose Lasix.  He still notes exertional dyspnea when he walks fast for 50 yards, up an incline or up more than 1 flight of steps.  No orthopnea or PND, no lightheadedness. No chest pain.  Lexiscan Cardiolite in 11/15 showed no ischemia.  Weight is up 3 lbs.   CTA chest done in 2/16 to look for evidence for PV stenosis post-AF ablation.  This showed mild short-segment narrowing of the left inferior pulmonary vein.   ECG: NSR,  QTc 465 msec  Labs (8/12): K 4.1, creatinine 1.2, LDL 91, HDL 53 Labs (8/14): K 4.6, creatinine 1.1 Labs (11/14): K 4.6, creatinine 1.2, LDL 91, HDL 56 Labs (3/15): AST 38, ALT 32, TSH normal, BNP 237 Labs (7/15): LDL 96, HDL 48, K 4.2, creatinine 1.2, TSH normal, BNP 237, AST 38, ALT 32 Labs (8/15): LFTs normal Labs (11/15): K 4.2, creatinine 1.1, Mg 2.2, BNP 227 Labs (2/16): K 4, creatinine 1.32, BNP 261  PMH: 1. CAD: 1st MI in 1988.  CABG 1996.  PCI  to CFX in 2009.  LHC (11/09) SVG-dRCA patent, total occlusion RCA, patent CFX stent, atretic LIMA, serial 60 and 80% proximal LAD stenoses, EF 60% with basal inferior hypokinesis.  Myoview in 2011 with no ischemia or infarction.  Echo (10/12) with EF 55-60%, mild LVH, mild MR.  Lexiscan Cardiolite in 2013 with no ischemia or infarction.  Echo (8/14) with EF 55-60%, moderate MR, moderate TR, PA systolic pressure 35 mmHg.  Echo (4/15) with EF 60-65%, mild focal basal septal hypertrophy, inferior basal akinesis, mild MR.  Lexiscan cardiolite (11/15) with EF 61%, fixed basal inferoseptal defect, no ischemia.  2. Raynauds 3. Post-polio syndrome 4. GERD with dilation of esophageal stricture in 12/12.  5. Hyperlipidemia 6. Brainstem stroke with Horner's syndrome.  7. Scoliosis. 8. H/o appendectomy 9. Herpes Zoster 10. Atrial fibrillation: DCCV to NSR in 8/14. DCCV to NSR in 1/15. Atrial fibrillation ablation 6/15 with Tikosyn loading (at [name]).   11. PFTs (4/15) with FVC 59%, FEV1 54%, ratio 91%, DLCO 53% => moderate obstructive defect thought to be related to COPD and severe restrictive defect thought to be due to elevated left hemidiaphragm and post-polio syndrome.  12. Chronic diastolic CHF.   SH: quit tobacco while in his 67s.  Married.  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   FH: Brother with CAD, heart transplant.  ROS: All systems reviewed and negative except as per HPI.   Current Outpatient Prescriptions  Medication Sig Dispense Refill  . acetaminophen (TYLENOL) 325 MG tablet Take 325 mg by mouth every 4 (four) hours as needed for pain.    [name] albuterol (PROAIR HFA) 108 (90 BASE) MCG/ACT inhaler Inhale 2 puffs into the lungs every 4 (four) hours as needed for wheezing or shortness of breath (and prior to exercise). 1 Inhaler 3  . BYSTOLIC 10 MG tablet TAKE 1 TABLET EVERY DAY 30 tablet 3  . cetirizine (ZYRTEC) 10 MG tablet Take 10 mg by mouth.    . furosemide (LASIX) 40 MG tablet Alternate 1  tablet (40mg ) by mouth with 1/2 tablet (20mg ) by mouth daily 90 tablet 2  . isosorbide mononitrate (IMDUR) 60 MG 24 hr tablet Take 60 mg by mouth daily.  4  . lactobacillus acidophilus & bulgar (LACTINEX) chewable tablet Chew 2 tablets by mouth daily.     [name] LORazepam (ATIVAN) 0.5 MG tablet Take 0.5 mg by mouth at bedtime as needed.     . magnesium oxide (MAG-OX) 400 MG tablet Take 400 mg by mouth daily.      . potassium chloride SA (K-DUR,KLOR-CON) 20 MEQ tablet Alternate 2 tablets by mouth daily with 1 tablet by mouth daily. 150 tablet 1  . PROLENSA 0.07 % SOLN as needed.   5  . ranitidine (ZANTAC) 300 MG tablet TAKE 1 TABLET BY MOUTH EVERY DAY 30 tablet 3  . rosuvastatin (CRESTOR) 40 MG tablet TAKE 1 TABLET BY MOUTH ONCE DAILY    . TIKOSYN 250 MCG capsule TAKE ONE CAPSULE EVERY 12 HOURS 60 capsule 4  . traZODone (DESYREL) 50 MG tablet Take 50 mg by mouth at bedtime.    . Vitamin D, Ergocalciferol, (DRISDOL) 50000 UNITS CAPS capsule Take 50,000 Units by mouth as directed. Pt taking twice a month    . XARELTO 20 MG TABS tablet TAKE 1 TABLET BY MOUTH ONCE DAILY 30 tablet 11   No current facility-administered medications for this visit.    BP 130/68  Pulse 66  Ht 5\' 6"  (1.676 m)  Wt 60.328 kg (133 lb)  BMI 21.47 kg/m2 General: NAD Neck: JVP 8-9 cm, no thyromegaly or thyroid nodule.  Lungs: Decreased breath sounds left base. CV: Nondisplaced PMI.  Heart regular S1/S2, no S3/S4, 1/6 HSM at apex.  1+ edema at ankles bilaterally.  No carotid bruit.  Normal pedal pulses.  Abdomen: Soft, nontender, no hepatosplenomegaly, no distention.  Neurologic: Alert and oriented x 3.  Psych: Normal affect.  Assessment/Plan: 1. Atrial fibrillation: He is now in NSR on Tikosyn s/p atrial fibrillation ablation at [name] on 06/25/14.   - Continue Tikosyn.  QTc acceptable today.    - Continue Xarelto and nebivolol.  - CTA chest to look for pulmonary vein stenosis showed mild stenosis left inferior PV, this does  not seem significant enough to cause his dyspnea but will forward study to [name] at [facility].  2. CAD: He is not taking aspirin as he has stable CAD and is on Xarelto.  Continue nebivolol and Crestor.  Recent Cardiolite showed no ischemia.   3. Hyperlipidemia: He is taking Crestor 40 mg daily.  Check lipids next appt.  4. Chronic diastolic CHF: NYHA class II symptoms with continued mild volume overload on exam. He breathes better on days that he takes Lasix but not on the days that he is not taking it.  - Increase Lasix to 40 qday alternating  with 20 qday and increase KCl to 40 mEq qday alternating with 20 mEq qday.  - BMET/BNP level in 2 wks.  - Consider RHC/LHC if no improvement in dyspnea.  - Try to walk 20-30 minutes daily.  5. Pulmonary: PFTs suggested both a restrictive and obstructive defect.  The restrictive defect is likely from post-polio syndrome and elevated left hemidiaphragm.  Mr Quant never smoked much, so the obstructive defect is more surprising.   Followup in 1 month    [name] 03/04/2015

## 2015-03-05 NOTE — Addendum Note (Signed)
Addended by: Katrine Coho on: 03/05/2015 08:47 AM   Modules accepted: Orders

## 2015-03-18 ENCOUNTER — Other Ambulatory Visit: Payer: Medicare Other

## 2015-03-19 ENCOUNTER — Other Ambulatory Visit: Payer: Self-pay | Admitting: Dermatology

## 2015-03-19 DIAGNOSIS — D225 Melanocytic nevi of trunk: Secondary | ICD-10-CM | POA: Diagnosis not present

## 2015-03-19 DIAGNOSIS — D485 Neoplasm of uncertain behavior of skin: Secondary | ICD-10-CM | POA: Diagnosis not present

## 2015-03-19 DIAGNOSIS — L570 Actinic keratosis: Secondary | ICD-10-CM | POA: Diagnosis not present

## 2015-03-19 DIAGNOSIS — L820 Inflamed seborrheic keratosis: Secondary | ICD-10-CM | POA: Diagnosis not present

## 2015-03-19 DIAGNOSIS — L814 Other melanin hyperpigmentation: Secondary | ICD-10-CM | POA: Diagnosis not present

## 2015-03-19 DIAGNOSIS — L821 Other seborrheic keratosis: Secondary | ICD-10-CM | POA: Diagnosis not present

## 2015-03-20 ENCOUNTER — Other Ambulatory Visit (INDEPENDENT_AMBULATORY_CARE_PROVIDER_SITE_OTHER): Payer: Medicare Other | Admitting: *Deleted

## 2015-03-20 DIAGNOSIS — I480 Paroxysmal atrial fibrillation: Secondary | ICD-10-CM | POA: Diagnosis not present

## 2015-03-20 DIAGNOSIS — I2510 Atherosclerotic heart disease of native coronary artery without angina pectoris: Secondary | ICD-10-CM

## 2015-03-20 LAB — BASIC METABOLIC PANEL
BUN: 18 mg/dL (ref 6–23)
CALCIUM: 9.7 mg/dL (ref 8.4–10.5)
CO2: 31 mEq/L (ref 19–32)
Chloride: 102 mEq/L (ref 96–112)
Creatinine, Ser: 1.42 mg/dL (ref 0.40–1.50)
GFR: 51.41 mL/min — AB (ref >60.00)
GLUCOSE: 95 mg/dL (ref 70–99)
Potassium: 4 mEq/L (ref 3.5–5.1)
SODIUM: 137 meq/L (ref 135–145)

## 2015-04-11 ENCOUNTER — Ambulatory Visit: Payer: Medicare Other | Admitting: Cardiology

## 2015-04-29 ENCOUNTER — Ambulatory Visit (INDEPENDENT_AMBULATORY_CARE_PROVIDER_SITE_OTHER): Payer: Medicare Other | Admitting: Nurse Practitioner

## 2015-04-29 ENCOUNTER — Encounter: Payer: Self-pay | Admitting: Nurse Practitioner

## 2015-04-29 VITALS — BP 130/62 | HR 80 | Ht 66.0 in | Wt 141.8 lb

## 2015-04-29 DIAGNOSIS — I480 Paroxysmal atrial fibrillation: Secondary | ICD-10-CM

## 2015-04-29 DIAGNOSIS — R0602 Shortness of breath: Secondary | ICD-10-CM

## 2015-04-29 DIAGNOSIS — Z79899 Other long term (current) drug therapy: Secondary | ICD-10-CM

## 2015-04-29 DIAGNOSIS — I2510 Atherosclerotic heart disease of native coronary artery without angina pectoris: Secondary | ICD-10-CM

## 2015-04-29 DIAGNOSIS — E785 Hyperlipidemia, unspecified: Secondary | ICD-10-CM | POA: Diagnosis not present

## 2015-04-29 DIAGNOSIS — R252 Cramp and spasm: Secondary | ICD-10-CM

## 2015-04-29 DIAGNOSIS — R0600 Dyspnea, unspecified: Secondary | ICD-10-CM

## 2015-04-29 LAB — BRAIN NATRIURETIC PEPTIDE: Pro B Natriuretic peptide (BNP): 159 pg/mL — ABNORMAL HIGH (ref 0.0–100.0)

## 2015-04-29 LAB — BASIC METABOLIC PANEL
BUN: 21 mg/dL (ref 6–23)
CO2: 28 mEq/L (ref 19–32)
Calcium: 10.2 mg/dL (ref 8.4–10.5)
Chloride: 101 mEq/L (ref 96–112)
Creatinine, Ser: 1.38 mg/dL (ref 0.40–1.50)
GFR: 53.11 mL/min — ABNORMAL LOW (ref >60.00)
Glucose, Bld: 94 mg/dL (ref 70–99)
Potassium: 4.3 mEq/L (ref 3.5–5.1)
Sodium: 135 mEq/L (ref 135–145)

## 2015-04-29 LAB — MAGNESIUM: Magnesium: 2.2 mg/dL (ref 1.5–2.5)

## 2015-04-29 MED ORDER — FUROSEMIDE 40 MG PO TABS: ORAL_TABLET | ORAL | Status: DC

## 2015-04-29 MED ORDER — POTASSIUM CHLORIDE CRYS ER 20 MEQ PO TBCR: EXTENDED_RELEASE_TABLET | ORAL | Status: DC

## 2015-04-29 NOTE — Progress Notes (Signed)
CARDIOLOGY OFFICE NOTE  Date:  04/29/2015    [name] Date of Birth: 01/27/1936 Medical Record #[id number]  PCP:  Geoffery Lyons, MD  Cardiologist:  Aundra Dubin    Chief Complaint  Patient presents with  . Atrial Fibrillation    1 month check - seen for [name]     History of Present Illness: [name] is a 79 y.o. male who presents today for a one month check. He is seen for [name]. Former patient of Dr. Doreatha Lew. He has known CAD with MI dating back to 1988, PCI in 1996 with subsequent past CABG in 1996 due to restenosis. Stents to the LCX in 2009. Last cath dates back to 2009. Had a SVG to the distal RCA patent and the CFX system was patent. LIMA was atretic and there were serial 60 to 80% stenoses in the native LAD. He was managed medically. He was previously on chronic Ranexa but stopped due to risk of QT prolongation with Tikosyn.   Other issues include GERD, recent herpes zoster with post herpetic neuralgia, post polio syndrome, PVCs, HLD, prior brainstem stroke with residual horner syndrome in 1996 following a cath and polyps.   Echo in 8/14 showed EF 55-60% with moderate MR and moderate TR. Lexiscan Cardiolite in 11/15 showed no ischemia.   He was initially noted to have atrial fibrillation in the summer of 2014. He was started on Xarelto and cardioverted to NSR in 8/14. Recurrent atrial fibrillation was noted in 1/15, and he was cardioverted to NSR again. This time, NSR did not hold long. At last appointment in 5/15, he was in persistent atrial fibrillation. I referred him to [facility] where he had atrial fibrillation ablation and Tikosyn loading. Ranolazine was stopped due to risk of QT prolongation and Imdur was started as an anti-anginal instead.  Last seen by [name] in March - had previously been started on Lasix. Continued to have some exertional dyspnea. Lasix was increased due to the DOE. He was in NSR. BNP not really all that impressive.   CTA  chest done in 2/16 to look for evidence for PV stenosis post-AF ablation. This showed mild short-segment narrowing of the left inferior pulmonary vein.   Comes back today. Here with his wife. She notes that he is short of breath with exertion - really with carrying heavy loads to the barn or going up hills. He says he is doing "ok". He is happy with how he is doing. He is working about 25 to 30 hours a week. He is walking and active on the farm but no real regular exercise program. Weight is down. Can only tolerate 30 mg of Lasix and 20 meq of potassium due to stomach issues. Having vision issues - from prior Zoster. Some cramps in his hands. No real chest pain. He is content with his current status. He has had his lipids checked by his PCP.  PMH: 1. CAD: 1st MI in 5. CABG 1996. PCI to CFX in 2009. LHC (11/09) SVG-dRCA patent, total occlusion RCA, patent CFX stent, atretic LIMA, serial 60 and 80% proximal LAD stenoses, EF 60% with basal inferior hypokinesis. Myoview in 2011 with no ischemia or infarction. Echo (10/12) with EF 55-60%, mild LVH, mild MR. Lexiscan Cardiolite in 2013 with no ischemia or infarction. Echo (8/14) with EF 55-60%, moderate MR, moderate TR, PA systolic pressure 35 mmHg. Echo (4/15) with EF 60-65%, mild focal basal septal hypertrophy, inferior basal akinesis, mild MR. Lexiscan  cardiolite (11/15) with EF 61%, fixed basal inferoseptal defect, no ischemia.  2. Raynauds 3. Post-polio syndrome 4. GERD with dilation of esophageal stricture in 12/12.  5. Hyperlipidemia 6. Brainstem stroke with Horner's syndrome.  7. Scoliosis. 8. H/o appendectomy 9. Herpes Zoster 10. Atrial fibrillation: DCCV to NSR in 8/14. DCCV to NSR in 1/15. Atrial fibrillation ablation 6/15 with Tikosyn loading (at [facility]).  11. PFTs (4/15) with FVC 59%, FEV1 54%, ratio 91%, DLCO 53% => moderate obstructive defect thought to be related to COPD and severe restrictive defect thought to be due to  elevated left hemidiaphragm and post-polio syndrome.  12. Chronic diastolic CHF.    Past Medical History  Diagnosis Date  . Coronary artery disease     First MI in 1988. PCI in 1996 with subsequent CABG in 1996 due to restenosis. S/P stents to LCX in 2009. Noted to have residual disease in the LAD in a diffuse manner and atretic LIMA graft. He is managed medically.   [name] History of Raynaud's syndrome   . 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 GERD (gastroesophageal reflux disease)   . Hyperlipidemia   . PVC (premature ventricular contraction)   . 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 Scoliosis     mild  . Abnormal nuclear cardiac imaging test March 2011    Has positive EKG response, no perfusion defect and normal EF  . Internal hemorrhoids   . Adenomatous colon polyp 12/2002  . Persistent atrial fibrillation     Past Surgical History  Procedure Laterality Date  . Coronary artery bypass graft  1996    with a LIMA to the LAD, SVG to RCA and SVG to OM  . Coronary stent placement  2009     LCX  . Appendectomy      Age 86  . Colonoscopy  2008  . Tonsillectomy    . Cardioversion N/A 08/22/2013    Procedure: CARDIOVERSION;  Surgeon: Carlena Bjornstad, MD;  Location: [facility] ENDOSCOPY;  Service: Cardiovascular;  Laterality: N/A;  . Cardioversion N/A 01/16/2014    Procedure: CARDIOVERSION;  Surgeon: Lelon Perla, MD;  Location: [facility] ENDOSCOPY;  Service: Cardiovascular;  Laterality: N/A;     Medications: Current Outpatient Prescriptions  Medication Sig Dispense Refill  . acetaminophen (TYLENOL) 325 MG tablet Take 325 mg by mouth every 4 (four) hours as needed for pain.    [name] albuterol (PROAIR HFA) 108 (90 BASE) MCG/ACT inhaler Inhale 2 puffs into the lungs every 4 (four) hours as needed for wheezing or shortness of breath (and prior to exercise). 1 Inhaler 3  . BYSTOLIC 10 MG tablet TAKE 1 TABLET EVERY DAY 30 tablet 3  . cetirizine (ZYRTEC) 10 MG tablet Take 10  mg by mouth.    . furosemide (LASIX) 40 MG tablet Alternate 1 tablet (40mg ) by mouth with 1/2 tablet (20mg ) by mouth daily 90 tablet 2  . isosorbide mononitrate (IMDUR) 60 MG 24 hr tablet Take 60 mg by mouth daily.  4  . lactobacillus acidophilus & bulgar (LACTINEX) chewable tablet Chew 2 tablets by mouth daily.     [name] LORazepam (ATIVAN) 0.5 MG tablet Take 0.5 mg by mouth at bedtime as needed.     . magnesium oxide (MAG-OX) 400 MG tablet Take 400 mg by mouth daily.      . potassium chloride SA (K-DUR,KLOR-CON) 20 MEQ tablet Alternate 2 tablets by mouth daily with 1 tablet by mouth daily.  150 tablet 1  . PROLENSA 0.07 % SOLN as needed.   5  . ranitidine (ZANTAC) 300 MG tablet TAKE 1 TABLET BY MOUTH EVERY DAY 30 tablet 3  . rosuvastatin (CRESTOR) 40 MG tablet TAKE 1 TABLET BY MOUTH ONCE DAILY    . TIKOSYN 250 MCG capsule TAKE ONE CAPSULE EVERY 12 HOURS 60 capsule 4  . traZODone (DESYREL) 50 MG tablet Take 50 mg by mouth at bedtime.    . Vitamin D, Ergocalciferol, (DRISDOL) 50000 UNITS CAPS capsule Take 50,000 Units by mouth as directed. Pt taking twice a month    . XARELTO 20 MG TABS tablet TAKE 1 TABLET BY MOUTH ONCE DAILY 30 tablet 11   No current facility-administered medications for this visit.    Allergies: Allergies  Allergen Reactions  . Morphine And Related Other (See Comments)    IV forms- vein irritation IV forms- vein irritation    Social History: The patient  reports that he quit smoking about 48 years ago. His smoking use included Cigarettes. He has a 2.5 pack-year smoking history. He has never used smokeless tobacco. He reports that he does not drink alcohol or use illicit drugs.   Family History: The patient's family history includes Heart Problems in his brother; Heart disease in his father and mother. There is no history of Colon cancer.   Review of Systems: Please see the history of present illness.   Otherwise, the review of systems is positive for DOE.   All other  systems are reviewed and negative.   Physical Exam: VS:  BP 130/62 mmHg  Pulse 80  Ht 5\' 6"  (1.676 m)  Wt 141 lb 12.8 oz (64.32 kg)  BMI 22.90 kg/m2  SpO2 98% .  BMI Body mass index is 22.9 kg/(m^2).  Wt Readings from Last 3 Encounters:  04/29/15 141 lb 12.8 oz (64.32 kg)  03/03/15 146 lb (66.225 kg)  01/27/15 142 lb (64.411 kg)    General: Pleasant. Thin. Weight is down 5 pounds. He is in no acute distress. HEENT: Normal. Neck: Supple, no JVD, carotid bruits, or masses noted.  Cardiac: Regular rate and rhythm. No murmurs, rubs, or gallops. No edema.  Respiratory:  Lungs are clear to auscultation bilaterally with normal work of breathing.  GI: Soft and nontender.  MS: No deformity or atrophy. Gait and ROM intact. Skin: Warm and dry. Color is normal.  Neuro:  Strength and sensation are intact and no gross focal deficits noted.  Psych: Alert, appropriate and with normal affect.   LABORATORY DATA:  EKG:  EKG is not ordered today.   Lab Results  Component Value Date   WBC 5.9 03/06/2014   HGB 14.0 03/06/2014   HCT 42.2 03/06/2014   PLT 166.0 03/06/2014   GLUCOSE 95 03/20/2015   CHOL 162 07/19/2014   TRIG 88.0 07/19/2014   HDL 48.20 07/19/2014   LDLCALC 96 07/19/2014   ALT 30 08/19/2014   AST 36 08/19/2014   NA 137 03/20/2015   K 4.0 03/20/2015   CL 102 03/20/2015   CREATININE 1.42 03/20/2015   BUN 18 03/20/2015   CO2 31 03/20/2015   TSH 1.62 03/25/2014   INR 2.6* 01/15/2014    BNP (last 3 results) No results for input(s): BNP in the last 8760 hours.  ProBNP (last 3 results)  Recent Labs  11/14/14 1006 02/07/15 1005  PROBNP 227.0* 261.0*     Other Studies Reviewed Today:   Myoview Impression from 10/2014 Exercise Capacity: Lexiscan with no exercise.  BP Response: Normal blood pressure response Clinical Symptoms: Typical symptoms with Lexiscan. ECG Impression: No significant ST segment change suggestive of ischemia. Comparison with Prior  Nuclear Study: No images to compare  Overall Impression: Low risk stress nuclear study with no evidence of ischemia. [name] basal inferoseptal fixed defect, possible infarct.  LV Ejection Fraction: 61%. LV Wall Motion: Septal wall hypokinesis  SKAINS, MARK, MD   Assessment/Plan:  1. Atrial fibrillation: He is now in NSR on Tikosyn s/p atrial fibrillation ablation at [facility] on 06/25/14. He has not had recurrent AF to his knowledge.  - Continue Tikosyn.   - Continue Xarelto and nebivolol.  - CTA chest to look for pulmonary vein stenosis showed mild stenosis left inferior PV, this does not seem significant enough to cause his dyspnea and this study was forwarded to [name] at [facility].  - rechecking BMET and MG+ today  2. CAD: He is not taking aspirin as he has stable CAD and is on Xarelto. Continue nebivolol and Crestor. Recent Cardiolite showed no ischemia.   3. Hyperlipidemia: He is taking Crestor 40 mg daily. Lipids are followed by PCP.  4. Chronic diastolic CHF: NYHA class II symptoms - he seems stable to me. He is happy with how he is doing. Would hold off on further testing for now.   5. Pulmonary: PFTs suggested both a restrictive and obstructive defect. The restrictive defect is likely from post-polio syndrome and elevated left hemidiaphragm. Mr Aja never smoked much, so the obstructive defect is more surprising.   Current medicines are reviewed with the patient today.  The patient does not have concerns regarding medicines other than what has been noted above.  The following changes have been made:  See above.  Labs/ tests ordered today include:   No orders of the defined types were placed in this encounter.     Disposition:   FU with [name] as planned.   Patient is agreeable to this plan and will call if any problems develop in the interim.   Signed: Burtis Junes, RN, ANP-C 04/29/2015 8:43 AM  [facility] [address]  [place], [place]  [address] Phone: [phone] Fax: [phone]

## 2015-04-29 NOTE — Patient Instructions (Addendum)
We will be checking the following labs today - BMET, BNP, MG   Medication Instructions:    Continue with your current medicines.     Testing/Procedures To Be Arranged:  N/A  Follow-Up:   We will see you back in 4 months    Other Special Instructions:   N/A  Call the [place] office at [phone] if you have any questions, problems or concerns.

## 2015-05-12 ENCOUNTER — Other Ambulatory Visit: Payer: Self-pay | Admitting: Cardiology

## 2015-05-26 ENCOUNTER — Other Ambulatory Visit: Payer: Self-pay | Admitting: Cardiology

## 2015-06-30 ENCOUNTER — Other Ambulatory Visit: Payer: Self-pay | Admitting: Cardiology

## 2015-07-03 ENCOUNTER — Other Ambulatory Visit: Payer: Self-pay | Admitting: Cardiology

## 2015-07-04 ENCOUNTER — Other Ambulatory Visit: Payer: Self-pay

## 2015-07-04 MED ORDER — ROSUVASTATIN CALCIUM 40 MG PO TABS: 40.0000 mg | ORAL_TABLET | Freq: Every day | ORAL | Status: DC

## 2015-08-22 DIAGNOSIS — I480 Paroxysmal atrial fibrillation: Secondary | ICD-10-CM | POA: Diagnosis not present

## 2015-08-22 DIAGNOSIS — E785 Hyperlipidemia, unspecified: Secondary | ICD-10-CM | POA: Diagnosis not present

## 2015-08-22 DIAGNOSIS — Z6821 Body mass index (BMI) 21.0-21.9, adult: Secondary | ICD-10-CM | POA: Diagnosis not present

## 2015-08-22 DIAGNOSIS — I10 Essential (primary) hypertension: Secondary | ICD-10-CM | POA: Diagnosis not present

## 2015-08-22 DIAGNOSIS — M859 Disorder of bone density and structure, unspecified: Secondary | ICD-10-CM | POA: Diagnosis not present

## 2015-08-22 DIAGNOSIS — I2510 Atherosclerotic heart disease of native coronary artery without angina pectoris: Secondary | ICD-10-CM | POA: Diagnosis not present

## 2015-09-02 ENCOUNTER — Ambulatory Visit (INDEPENDENT_AMBULATORY_CARE_PROVIDER_SITE_OTHER): Payer: Medicare Other | Admitting: Nurse Practitioner

## 2015-09-02 ENCOUNTER — Encounter: Payer: Self-pay | Admitting: Nurse Practitioner

## 2015-09-02 VITALS — BP 120/68 | HR 67 | Resp 18 | Ht 66.0 in | Wt 139.0 lb

## 2015-09-02 DIAGNOSIS — Z79899 Other long term (current) drug therapy: Secondary | ICD-10-CM

## 2015-09-02 DIAGNOSIS — I5032 Chronic diastolic (congestive) heart failure: Secondary | ICD-10-CM

## 2015-09-02 DIAGNOSIS — I2510 Atherosclerotic heart disease of native coronary artery without angina pectoris: Secondary | ICD-10-CM

## 2015-09-02 DIAGNOSIS — I480 Paroxysmal atrial fibrillation: Secondary | ICD-10-CM | POA: Diagnosis not present

## 2015-09-02 LAB — BASIC METABOLIC PANEL
BUN: 20 mg/dL (ref 6–23)
CO2: 30 mEq/L (ref 19–32)
Calcium: 10.2 mg/dL (ref 8.4–10.5)
Chloride: 102 mEq/L (ref 96–112)
Creatinine, Ser: 1.27 mg/dL (ref 0.40–1.50)
GFR: 58.41 mL/min — ABNORMAL LOW (ref >60.00)
Glucose, Bld: 77 mg/dL (ref 70–99)
Potassium: 4.4 mEq/L (ref 3.5–5.1)
Sodium: 138 mEq/L (ref 135–145)

## 2015-09-02 LAB — BRAIN NATRIURETIC PEPTIDE: Pro B Natriuretic peptide (BNP): 225 pg/mL — ABNORMAL HIGH (ref 0.0–100.0)

## 2015-09-02 LAB — MAGNESIUM: Magnesium: 2.2 mg/dL (ref 1.5–2.5)

## 2015-09-02 NOTE — Progress Notes (Signed)
CARDIOLOGY OFFICE NOTE  Date:  09/02/2015    [name] Date of Birth: [birth year] Medical Record #[id number]  PCP:  Jimmy Lyons, MD  Cardiologist:  [name]    Chief Complaint  Patient presents with  . FU for PAF    Seen for Dr. Aundra Weiss    History of Present Illness: [name] is a 79 y.o. male who presents today for a 4 month check. He is seen for Dr. Aundra Weiss. Former patient of Dr. Susa Weiss. He has known CAD with MI dating back to 1988, PCI in 1996 with subsequent past CABG in 1996 due to restenosis. Stents to the LCX in 2009. Last cath dates back to 2009. Had a SVG to the distal RCA patent and the CFX system was patent. LIMA was atretic and there were serial 60 to 80% stenoses in the native LAD. He has been managed medically. He was previously on chronic Ranexa but stopped due to risk of QT prolongation with Tikosyn.   Other issues include GERD, recent herpes zoster with post herpetic neuralgia, post polio syndrome, PVCs, HLD, colonic polyps and prior brainstem stroke with residual Horner syndrome in 1996 following cardiac cath.   Echo in 03/2014 showed EF 60 to 65% with mild Jimmy and biatrial enlargement. Lexiscan Cardiolite in 11/15 showed no ischemia.   He was initially noted to have atrial fibrillation in the summer of 2014. He was started on Xarelto and cardioverted to NSR in 8/14. Recurrent atrial fibrillation was noted in 1/15, and he was cardioverted to NSR again. This time, NSR did not hold long. At last appointment in 5/15, he was in persistent atrial fibrillation.He was referred to [facility] where he had atrial fibrillation ablation and Tikosyn loading. Ranolazine was stopped due to risk of QT prolongation and Imdur was started as an anti-anginal instead.  Last seen by Dr. Aundra Weiss in March - had previously been started on Lasix. Continued to have some exertional dyspnea. Lasix was increased due to the DOE. He was in NSR. BNP not really all that impressive.   CTA  chest done in 2/16 to look for evidence for PV stenosis post-AF ablation. This showed mild short-segment narrowing of the left inferior pulmonary vein.   I last saw him in May - he had stable DOE. He was happy with how he was doing. Lipids checked by PCP.   Comes back today. Here with his wife. Doing ok. Stable DOE. Using his albuterol maybe once a week. Continues to work. Breathing little harder with all the high humidity days we have had this summer. No chest pain. No AF that he is aware of. He needs to make his follow up with both Dr. Lamonte Weiss of pulmonary and Dr. Clyda Weiss at [facility]. Tolerating his medicines. Biggest complaint is cramps.    PMH: 1. CAD: 1st MI in 65. CABG 1996. PCI to CFX in 2009. LHC (11/09) SVG-dRCA patent, total occlusion RCA, patent CFX stent, atretic LIMA, serial 60 and 80% proximal LAD stenoses, EF 60% with basal inferior hypokinesis. Myoview in 2011 with no ischemia or infarction. Echo (10/12) with EF 55-60%, mild LVH, mild Jimmy. Lexiscan Cardiolite in 2013 with no ischemia or infarction. Echo (8/14) with EF 55-60%, moderate Jimmy, moderate TR, PA systolic pressure 35 mmHg. Echo (4/15) with EF 60-65%, mild focal basal septal hypertrophy, inferior basal akinesis, mild Jimmy. Lexiscan cardiolite (11/15) with EF 61%, fixed basal inferoseptal defect, no ischemia.  2. Raynauds 3. Post-polio syndrome 4. GERD with dilation of esophageal  stricture in 12/12.  5. Hyperlipidemia 6. Brainstem stroke with Horner's syndrome.  7. Scoliosis. 8. H/o appendectomy 9. Herpes Zoster 10. Atrial fibrillation: DCCV to NSR in 8/14. DCCV to NSR in 1/15. Atrial fibrillation ablation 6/15 with Tikosyn loading (at [facility]).  11. PFTs (4/15) with FVC 59%, FEV1 54%, ratio 91%, DLCO 53% => moderate obstructive defect thought to be related to COPD and severe restrictive defect thought to be due to elevated left hemidiaphragm and post-polio syndrome.  12. Chronic diastolic CHF.     Past Medical  History  Diagnosis Date  . Coronary artery disease     First MI in 1988. PCI in 1996 with subsequent CABG in 1996 due to restenosis. S/P stents to LCX in 2009. Noted to have residual disease in the LAD in a diffuse manner and atretic LIMA graft. He is managed medically.   [name] History of Raynaud's syndrome   . 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 GERD (gastroesophageal reflux disease)   . Hyperlipidemia   . PVC (premature ventricular contraction)   . 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 Scoliosis     mild  . Abnormal nuclear cardiac imaging test March 2011    Has positive EKG response, no perfusion defect and normal EF  . Internal hemorrhoids   . Adenomatous colon polyp 12/2002  . Persistent atrial fibrillation     Past Surgical History  Procedure Laterality Date  . Coronary artery bypass graft  1996    with a LIMA to the LAD, SVG to RCA and SVG to OM  . Coronary stent placement  2009     LCX  . Appendectomy      Age 44  . Colonoscopy  2008  . Tonsillectomy    . Cardioversion N/A 08/22/2013    Procedure: CARDIOVERSION;  Surgeon: Jimmy Bjornstad, MD;  Location: [facility] ENDOSCOPY;  Service: Cardiovascular;  Laterality: N/A;  . Cardioversion N/A 01/16/2014    Procedure: CARDIOVERSION;  Surgeon: Jimmy Perla, MD;  Location: [facility] ENDOSCOPY;  Service: Cardiovascular;  Laterality: N/A;     Medications: Current Outpatient Prescriptions  Medication Sig Dispense Refill  . acetaminophen (TYLENOL) 325 MG tablet Take 325 mg by mouth every 4 (four) hours as needed for pain.    [name] albuterol (PROAIR HFA) 108 (90 BASE) MCG/ACT inhaler Inhale 2 puffs into the lungs every 4 (four) hours as needed for wheezing or shortness of breath (and prior to exercise). 1 Inhaler 3  . b complex vitamins tablet Take 1 tablet by mouth daily.    [name] BYSTOLIC 10 MG tablet TAKE 1 TABLET EVERY DAY 30 tablet 3  . cetirizine (ZYRTEC) 10 MG tablet Take 10 mg by mouth.    . furosemide (LASIX) 40  MG tablet Taking 30 mg each day 90 tablet 2  . isosorbide mononitrate (IMDUR) 60 MG 24 hr tablet Take 60 mg by mouth daily.  4  . lactobacillus acidophilus & bulgar (LACTINEX) chewable tablet Chew 2 tablets by mouth daily.     [name] LORazepam (ATIVAN) 0.5 MG tablet Take 0.5 mg by mouth at bedtime as needed.     . magnesium oxide (MAG-OX) 400 MG tablet Take 400 mg by mouth daily.      . potassium chloride SA (K-DUR,KLOR-CON) 20 MEQ tablet Taking one tablet daily 150 tablet 1  . ranitidine (ZANTAC) 300 MG tablet TAKE 1 TABLET BY MOUTH EVERY DAY 30 tablet 3  . rosuvastatin (CRESTOR)  40 MG tablet Take 1 tablet (40 mg total) by mouth daily. 30 tablet 3  . testosterone (ANDROGEL) 50 MG/5GM (1%) GEL Place 10 g onto the skin daily.    [name] TIKOSYN 250 MCG capsule TAKE 1 CAPSULE BY MOUTH EVERY 12 HOURS 60 capsule 4  . traZODone (DESYREL) 50 MG tablet Take 50 mg by mouth at bedtime.    . Vitamin D, Ergocalciferol, (DRISDOL) 50000 UNITS CAPS capsule Take 50,000 Units by mouth as directed. Pt taking twice a month    . XARELTO 20 MG TABS tablet TAKE 1 TABLET BY MOUTH ONCE DAILY 30 tablet 11   No current facility-administered medications for this visit.    Allergies: Allergies  Allergen Reactions  . Morphine And Related Other (See Comments)    IV forms- vein irritation IV forms- vein irritation    Social History: The patient  reports that he quit smoking about 48 years ago. His smoking use included Cigarettes. He has a 2.5 pack-year smoking history. He has never used smokeless tobacco. He reports that he does not drink alcohol or use illicit drugs.   Family History: The patient's family history includes Heart Problems in his brother; Heart disease in his father and mother. There is no history of Colon cancer.   Review of Systems: Please see the history of present illness.   Otherwise, the review of systems is positive for none.   All other systems are reviewed and negative.   Physical Exam: VS:  BP  120/68 mmHg  Pulse 67  Resp 18  Ht 5\' 6"  (1.676 m)  Wt 139 lb (63.05 kg)  BMI 22.45 kg/m2  SpO2 98% .  BMI Body mass index is 22.45 kg/(m^2).  Wt Readings from Last 3 Encounters:  09/02/15 139 lb (63.05 kg)  04/29/15 141 lb 12.8 oz (64.32 kg)  03/03/15 146 lb (66.225 kg)    General: Pleasant. Well developed, well nourished and in no acute distress.  HEENT: Normal. Neck: Supple, no JVD, carotid bruits, or masses noted.  Cardiac: Regular rate and rhythm. No murmurs, rubs, or gallops. No edema. He has a new tattoo on the left chest in honor of his son. Respiratory:  Lungs are clear to auscultation bilaterally with normal work of breathing.  GI: Soft and nontender.  MS: No deformity or atrophy. Gait and ROM intact. Skin: Warm and dry. Color is normal.  Neuro:  Strength and sensation are intact and no gross focal deficits noted.  Psych: Alert, appropriate and with normal affect.   LABORATORY DATA:  EKG:  EKG is ordered today. This demonstrates NSR with nonspecific ST/T wave changes - unchanged  Lab Results  Component Value Date   WBC 5.9 03/06/2014   HGB 14.0 03/06/2014   HCT 42.2 03/06/2014   PLT 166.0 03/06/2014   GLUCOSE 94 04/29/2015   CHOL 162 07/19/2014   TRIG 88.0 07/19/2014   HDL 48.20 07/19/2014   LDLCALC 96 07/19/2014   ALT 30 08/19/2014   AST 36 08/19/2014   NA 135 04/29/2015   K 4.3 04/29/2015   CL 101 04/29/2015   CREATININE 1.38 04/29/2015   BUN 21 04/29/2015   CO2 28 04/29/2015   TSH 1.62 03/25/2014   INR 2.6* 01/15/2014    BNP (last 3 results) No results for input(s): BNP in the last 8760 hours.  ProBNP (last 3 results)  Recent Labs  11/14/14 1006 02/07/15 1005 04/29/15 0909  PROBNP 227.0* 261.0* 159.0*     Other Studies Reviewed Today:  Myoview  Impression from 10/2014 Exercise Capacity: Lexiscan with no exercise. BP Response: Normal blood pressure response Clinical Symptoms: Typical symptoms with Lexiscan. ECG Impression: No  significant ST segment change suggestive of ischemia. Comparison with Prior Nuclear Study: No images to compare  Overall Impression: Low risk stress nuclear study with no evidence of ischemia. [name] basal inferoseptal fixed defect, possible infarct.  LV Ejection Fraction: 61%. LV Wall Motion: Septal wall hypokinesis  Candee Furbish, MD    Echo Study Conclusions from 03/2014  - Left ventricle: The cavity size was normal. There was mild focal basal hypertrophy of the septum. Systolic function was normal. The estimated ejection fraction was in the range of 60% to 65%. There is akinesis of the basalinferior myocardium. - Mitral valve: Calcified annulus. Mildly thickened leaflets . Mild regurgitation. - Left atrium: The atrium was moderately to severely dilated. - Right atrium: The atrium was mildly dilated. Impressions:  - Compared to 08/13/13, LV function remains normal; biatrial enlargement; Jimmy now appears mild.  Assessment/Plan:  1. Atrial fibrillation: He is now in NSR on Tikosyn s/p atrial fibrillation ablation at [facility] on 06/25/14. He has not had recurrent AF to his knowledge.  - Continue Tikosyn.  - Continue Xarelto and nebivolol.  - CTA chest to look for pulmonary vein stenosis showed mild stenosis left inferior PV, this does not seem significant enough to cause his dyspnea and this study has been  forwarded to Dr [name] at [facility] earlier this year.  - rechecking BMET and MG+ today  2. CAD: He is not taking aspirin as he has stable CAD and is on Xarelto. Continue nebivolol and Crestor. Cardiolite from 10/2014 showed no ischemia.   3. Hyperlipidemia: He is taking Crestor 40 mg daily. Lipids are followed by PCP.  4. Chronic diastolic CHF: NYHA class II symptoms - he seems stable to me. He is happy with how he is doing. Would hold off on further testing for now. He will be following up with pulmonary later this fall.   5. Pulmonary: PFTs suggested both a  restrictive and obstructive defect. The restrictive defect is likely from post-polio syndrome and elevated left hemidiaphragm. [name] never smoked much, so the obstructive defect is more surprising.   Current medicines are reviewed with the patient today.  The patient does not have concerns regarding medicines other than what has been noted above.  The following changes have been made:  See above.  Labs/ tests ordered today include:    Orders Placed This Encounter  Procedures  . Brain natriuretic peptide  . Basic metabolic panel  . Magnesium  . EKG 12-Lead     Disposition:   FU with me in 6 months.   Patient is agreeable to this plan and will call if any problems develop in the interim.   Signed: Burtis Junes, RN, ANP-C 09/02/2015 9:39 AM  [facility] [address] [place], [place]  [address] Phone: [phone] Fax: [phone]

## 2015-09-02 NOTE — Patient Instructions (Addendum)
We will be checking the following labs today - BNP, BMET and MG   Medication Instructions:    Continue with your current medicines.     Testing/Procedures To Be Arranged:  N/A  Follow-Up:   See me in 6 months.     Other Special Instructions:   Regular activity!!!!  Call the [place] office at [phone] if you have any questions, problems or concerns.

## 2015-09-30 ENCOUNTER — Other Ambulatory Visit: Payer: Self-pay | Admitting: Cardiology

## 2015-10-27 ENCOUNTER — Other Ambulatory Visit: Payer: Self-pay | Admitting: Cardiology

## 2015-11-10 ENCOUNTER — Ambulatory Visit: Payer: Medicare Other | Admitting: Emergency Medicine

## 2015-11-17 ENCOUNTER — Ambulatory Visit (INDEPENDENT_AMBULATORY_CARE_PROVIDER_SITE_OTHER): Payer: Medicare Other | Admitting: Emergency Medicine

## 2015-11-17 ENCOUNTER — Encounter: Payer: Self-pay | Admitting: Emergency Medicine

## 2015-11-17 VITALS — BP 114/62 | HR 68 | Ht 66.0 in | Wt 134.0 lb

## 2015-11-17 DIAGNOSIS — K219 Gastro-esophageal reflux disease without esophagitis: Secondary | ICD-10-CM | POA: Diagnosis not present

## 2015-11-17 DIAGNOSIS — I2510 Atherosclerotic heart disease of native coronary artery without angina pectoris: Secondary | ICD-10-CM

## 2015-11-17 DIAGNOSIS — J449 Chronic obstructive pulmonary disease, unspecified: Secondary | ICD-10-CM

## 2015-11-17 DIAGNOSIS — J309 Allergic rhinitis, unspecified: Secondary | ICD-10-CM | POA: Insufficient documentation

## 2015-11-17 NOTE — Patient Instructions (Signed)
Please continue zyrtec as you are taking it  Use fluticasone NS, 2 sprays each nostril daily during the allergy season.  Get the flu shot today Continue ranitidine Take albuterol 2 puffs up to every 4 hours if needed for shortness of breath.  Follow with Dr Lamonte Sakai in 12 months or sooner if you have any problems

## 2015-11-17 NOTE — Progress Notes (Signed)
Subjective:    Patient ID: [name], male    DOB: 01-07-1936, 79 y.o.   MRN: [id number]  HPI 79 yo former mild smoker (5 pk-yrs), hx CAD/CABG, post-polio, Raynaud's, A Fib, brainstem CVA w Horner's syndrome. He is referred by L. Gerhardt for dyspnea. He has had dyspnea and dizziness that at one point seemed to correlate with his A Fib, but more recently he can't tell the difference whether he is in A fib or not. He decided not to start amiodarone. PFT done 04/02/14 show mixed obstruction and restriction, borderline BD response.   He has dyspnea with walking. Sometimes made him feel tight, like air won't move. He paces himself. Occasionally coughs, no wheeze. He snores at night. No CP.   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 No birds. No water damage.   ROV 06/05/14 -- hx CABG, A Fib, mixed disease on his PFT, follows up for dyspnea. Last visit we started Symbicort. He believes that he may have benefiting from it some w regard to exercise tolerance. He is set to have an ablation at [facility] on 6/30. Hopefully this will benefit his breathing as well.   ROV 08/16/14 -- hx CABG, A Fib, mixed disease on his PFT, follows up for dyspnea. He is s/p ablation > breathing has benefited!  He has been doing well, has some nasal and chest mucous. He is on loratadine daily, still with nasal gtt.   ROV 11/17/15 -- follow-up visit for dyspnea and mixed obstruction and restriction on Pulmicort function testing. His dyspnea actually improved significantly when his atrial fibrillation was ablated. We've been treating allergic rhinitis as a contributor to cough and potentially obstructive lung disease.   He has some dyspnea with extreme cold or hot weather.  He has rarely needed albuterol - helps him. He has some dry cough.     Review of Systems  Constitutional: Negative for fever and unexpected weight change.  HENT: Positive for congestion and postnasal drip. Negative for dental problem,  ear pain, nosebleeds, rhinorrhea, sinus pressure, sneezing, sore throat and trouble swallowing.   Eyes: Positive for itching. Negative for redness.  Respiratory: Positive for shortness of breath. Negative for cough, chest tightness and wheezing.   Cardiovascular: Negative for palpitations and leg swelling.  Gastrointestinal: Negative for nausea and vomiting.  Genitourinary: Negative for dysuria.  Musculoskeletal: Negative for joint swelling.  Skin: Negative for rash.  Neurological: Negative for headaches.  Hematological: Does not bruise/bleed easily.  Psychiatric/Behavioral: Negative for dysphoric mood. The patient is not nervous/anxious.        Objective:   Physical Exam Filed Vitals:   11/17/15 1001  BP: 114/62  Pulse: 68  Height: 5\' 6"  (1.676 m)  Weight: 134 lb (60.782 kg)  SpO2: 98%   Gen: Pleasant, well-nourished, in no distress,  normal affect  ENT: No lesions,  mouth clear,  oropharynx clear, no postnasal drip  Neck: No JVD, no TMG, no carotid bruits  Lungs: No use of accessory muscles, no dullness to percussion, clear without rales or rhonchi  Cardiovascular: RRR, heart sounds normal, no murmur or gallops, no peripheral edema  Musculoskeletal: No deformities, no cyanosis or clubbing  Neuro: alert, non focal  Skin: Warm, no lesions or rashes      Assessment & Plan:  COPD (chronic obstructive pulmonary disease) Mild obstruction. He rarely needs albuterol and appears to respond adequately. I do not believe he needs controller medication at this time. We  will attempt to tightly manage his exacerbating factors including GERD and allergic rhinitis.   Acid reflux Appears to have good control with ranitidine  Allergic rhinitis Currently on Zyrtec. Discussed adding exam nasal spray to his regimen during the allergy season. We will fill this prescription for him today

## 2015-11-17 NOTE — Assessment & Plan Note (Signed)
Appears to have good control with ranitidine

## 2015-11-17 NOTE — Assessment & Plan Note (Signed)
Currently on Zyrtec. Discussed adding exam nasal spray to his regimen during the allergy season. We will fill this prescription for him today

## 2015-11-17 NOTE — Assessment & Plan Note (Signed)
Mild obstruction. He rarely needs albuterol and appears to respond adequately. I do not believe he needs controller medication at this time. We will attempt to tightly manage his exacerbating factors including GERD and allergic rhinitis.

## 2016-01-28 ENCOUNTER — Telehealth: Payer: Self-pay | Admitting: Cardiology

## 2016-01-28 NOTE — Telephone Encounter (Signed)
Pt insurance will no longer pay for his Tikosyn.He would like for Dr Aundra Dubin to do an authorization so he can still get it.

## 2016-02-02 NOTE — Telephone Encounter (Signed)
Spoke with patient's wife, as he was at work. [employer], which we did not have. Prior auth for brand Tikosyn sent to [facility].

## 2016-02-06 ENCOUNTER — Telehealth: Payer: Self-pay | Admitting: Cardiology

## 2016-02-06 MED ORDER — DOFETILIDE 250 MCG PO CAPS: ORAL_CAPSULE | ORAL | Status: DC

## 2016-02-06 NOTE — Telephone Encounter (Signed)
Pt's wife  states [employer] does not want to pay for brand name Tikosyn for pt. Pt's wife asking if generic Tikosyn is okay with Dr Aundra Dubin. Pt's wife  states pt prefers brand name.   Pt's wife advised I will forward to Dr Aundra Dubin for review.

## 2016-02-06 NOTE — Telephone Encounter (Signed)
New message   Please call pt about the pt medication tikosyn  261mcg   It has been denied by [facility] please call pt

## 2016-02-06 NOTE — Telephone Encounter (Signed)
Pt's wife notified.

## 2016-02-06 NOTE — Telephone Encounter (Signed)
Dofetilide generic would be ok, most of our patients are taking this now and seems to work equivalently.

## 2016-02-10 ENCOUNTER — Other Ambulatory Visit: Payer: Self-pay | Admitting: Cardiology

## 2016-02-13 ENCOUNTER — Other Ambulatory Visit: Payer: Self-pay | Admitting: Cardiology

## 2016-02-23 DIAGNOSIS — H5203 Hypermetropia, bilateral: Secondary | ICD-10-CM | POA: Diagnosis not present

## 2016-02-23 DIAGNOSIS — H25819 Combined forms of age-related cataract, unspecified eye: Secondary | ICD-10-CM | POA: Diagnosis not present

## 2016-02-23 DIAGNOSIS — H524 Presbyopia: Secondary | ICD-10-CM | POA: Diagnosis not present

## 2016-02-23 DIAGNOSIS — H52223 Regular astigmatism, bilateral: Secondary | ICD-10-CM | POA: Diagnosis not present

## 2016-02-24 DIAGNOSIS — E291 Testicular hypofunction: Secondary | ICD-10-CM | POA: Diagnosis not present

## 2016-02-24 DIAGNOSIS — Z125 Encounter for screening for malignant neoplasm of prostate: Secondary | ICD-10-CM | POA: Diagnosis not present

## 2016-02-24 DIAGNOSIS — I10 Essential (primary) hypertension: Secondary | ICD-10-CM | POA: Diagnosis not present

## 2016-02-24 DIAGNOSIS — M859 Disorder of bone density and structure, unspecified: Secondary | ICD-10-CM | POA: Diagnosis not present

## 2016-03-01 DIAGNOSIS — Z6820 Body mass index (BMI) 20.0-20.9, adult: Secondary | ICD-10-CM | POA: Diagnosis not present

## 2016-03-01 DIAGNOSIS — I2510 Atherosclerotic heart disease of native coronary artery without angina pectoris: Secondary | ICD-10-CM | POA: Diagnosis not present

## 2016-03-01 DIAGNOSIS — Z1389 Encounter for screening for other disorder: Secondary | ICD-10-CM | POA: Diagnosis not present

## 2016-03-01 DIAGNOSIS — Z0000 Encounter for general adult medical examination without abnormal findings: Secondary | ICD-10-CM | POA: Diagnosis not present

## 2016-03-01 DIAGNOSIS — I10 Essential (primary) hypertension: Secondary | ICD-10-CM | POA: Diagnosis not present

## 2016-03-01 DIAGNOSIS — G4700 Insomnia, unspecified: Secondary | ICD-10-CM | POA: Diagnosis not present

## 2016-03-01 DIAGNOSIS — E291 Testicular hypofunction: Secondary | ICD-10-CM | POA: Diagnosis not present

## 2016-03-01 DIAGNOSIS — J309 Allergic rhinitis, unspecified: Secondary | ICD-10-CM | POA: Diagnosis not present

## 2016-03-01 DIAGNOSIS — I8390 Asymptomatic varicose veins of unspecified lower extremity: Secondary | ICD-10-CM | POA: Diagnosis not present

## 2016-03-01 DIAGNOSIS — M859 Disorder of bone density and structure, unspecified: Secondary | ICD-10-CM | POA: Diagnosis not present

## 2016-03-01 DIAGNOSIS — I480 Paroxysmal atrial fibrillation: Secondary | ICD-10-CM | POA: Diagnosis not present

## 2016-03-01 DIAGNOSIS — E784 Other hyperlipidemia: Secondary | ICD-10-CM | POA: Diagnosis not present

## 2016-03-02 ENCOUNTER — Ambulatory Visit (INDEPENDENT_AMBULATORY_CARE_PROVIDER_SITE_OTHER): Payer: Medicare Other | Admitting: Nurse Practitioner

## 2016-03-02 ENCOUNTER — Encounter: Payer: Self-pay | Admitting: Nurse Practitioner

## 2016-03-02 VITALS — BP 150/70 | HR 64 | Ht 66.0 in | Wt 133.8 lb

## 2016-03-02 DIAGNOSIS — R252 Cramp and spasm: Secondary | ICD-10-CM | POA: Diagnosis not present

## 2016-03-02 LAB — MAGNESIUM: Magnesium: 2.1 mg/dL (ref 1.5–2.5)

## 2016-03-02 MED ORDER — FUROSEMIDE 40 MG PO TABS: ORAL_TABLET | ORAL | Status: DC

## 2016-03-02 NOTE — Patient Instructions (Addendum)
We will be checking the following labs today - Mg level   Medication Instructions:    Continue with your current medicines.     Testing/Procedures To Be Arranged:  N/A  Follow-Up:   See me in 6 months with EKG    Other Special Instructions:   Monitor your BP for me periodically - goal is less than 135/85    If you need a refill on your cardiac medications before your next appointment, please call your pharmacy.   Call the [place] office at [phone] if you have any questions, problems or concerns.

## 2016-03-02 NOTE — Progress Notes (Signed)
CARDIOLOGY OFFICE NOTE  Date:  03/02/2016    [name] Date of Birth: [birth year] Medical Record [id number]  PCP:  Geoffery Lyons, MD  Cardiologist:  Aundra Dubin    Chief Complaint  Patient presents with  . Coronary Artery Disease  . Atrial Fibrillation    Follow up visit - seen for [name]    History of Present Illness: [name] is a 80 y.o. male who presents today for a 6 month check. He is seen for [name]. Former patient of Dr. [name]. He has known CAD with MI dating back to 1988, PCI in 1996 with subsequent past CABG in 1996 due to restenosis. Stents to the LCX in 2009. Last cath dates back to 2009. Had a SVG to the distal RCA patent and the CFX system was patent. LIMA was atretic and there were serial 60 to 80% stenoses in the native LAD. He has been managed medically. He was previously on chronic Ranexa but stopped due to risk of QT prolongation with Tikosyn.   Other issues include GERD, recent herpes zoster with post herpetic neuralgia, post polio syndrome, PVCs, HLD, colonic polyps and prior brainstem stroke with residual Horner syndrome in 1996 following cardiac cath.   Echo in 03/2014 showed EF 60 to 65% with mild MR and biatrial enlargement. Lexiscan Cardiolite in 11/15 showed no ischemia.   He was initially noted to have atrial fibrillation in the summer of 2014. He was started on Xarelto and cardioverted to NSR in 8/14. Recurrent atrial fibrillation was noted in 1/15, and he was cardioverted to NSR again. This time, NSR did not hold long. At last appointment in 5/15, he was in persistent atrial fibrillation.He was referred to [facility] where he had atrial fibrillation ablation and Tikosyn loading. Ranolazine was stopped due to risk of QT prolongation and Imdur was started as an anti-anginal instead.  Last seen by [name] in March of 2016 - had previously been started on Lasix. Continued to have some exertional dyspnea. Lasix was increased due to the  DOE. He was in NSR. BNP not really all that impressive.   CTA chest done in 2/16 to look for evidence for PV stenosis post-AF ablation. This showed mild short-segment narrowing of the left inferior pulmonary vein.   I last saw him in September of 2016 - he had stable DOE. He was happy with how he was doing. Lipids checked by PCP.   Comes back today. Here with his wife. He is doing ok. Legs "get tired" and has had cramps - so he increased his potassium. Needs labs checked today. Stable DOE. Had recent labs last month with [name]. Feels like his rhythm has been ok. He was released by EP at [facility]. No chest pain. Not short of breath. Feels pretty happy with how he is doing.   PMH: 1. CAD: 1st MI in 46. CABG 1996. PCI to CFX in 2009. LHC (11/09) SVG-dRCA patent, total occlusion RCA, patent CFX stent, atretic LIMA, serial 60 and 80% proximal LAD stenoses, EF 60% with basal inferior hypokinesis. Myoview in 2011 with no ischemia or infarction. Echo (10/12) with EF 55-60%, mild LVH, mild MR. Lexiscan Cardiolite in 2013 with no ischemia or infarction. Echo (8/14) with EF 55-60%, moderate MR, moderate TR, PA systolic pressure 35 mmHg. Echo (4/15) with EF 60-65%, mild focal basal septal hypertrophy, inferior basal akinesis, mild MR. Lexiscan cardiolite (11/15) with EF 61%, fixed basal inferoseptal defect, no ischemia.  2. Raynauds 3.  Post-polio syndrome 4. GERD with dilation of esophageal stricture in 12/12.  5. Hyperlipidemia 6. Brainstem stroke with Horner's syndrome.  7. Scoliosis. 8. H/o appendectomy 9. Herpes Zoster 10. Atrial fibrillation: DCCV to NSR in 8/14. DCCV to NSR in 1/15. Atrial fibrillation ablation 6/15 with Tikosyn loading (at [facility]).  11. PFTs (4/15) with FVC 59%, FEV1 54%, ratio 91%, DLCO 53% => moderate obstructive defect thought to be related to COPD and severe restrictive defect thought to be due to elevated left hemidiaphragm and post-polio syndrome.  12. Chronic  diastolic CHF.    Past Medical History  Diagnosis Date  . Coronary artery disease     First MI in 1988. PCI in 1996 with subsequent CABG in 1996 due to restenosis. S/P stents to LCX in 2009. Noted to have residual disease in the LAD in a diffuse manner and atretic LIMA graft. He is managed medically.   [name] History of Raynaud's syndrome   . 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 GERD (gastroesophageal reflux disease)   . Hyperlipidemia   . PVC (premature ventricular contraction)   . Brainstem stroke ([place]) 1996    with residual horner syndrome  . Scoliosis     mild  . Abnormal nuclear cardiac imaging test March 2011    Has positive EKG response, no perfusion defect and normal EF  . Internal hemorrhoids   . Adenomatous colon polyp 12/2002  . Persistent atrial fibrillation [facility])     Past Surgical History  Procedure Laterality Date  . Coronary artery bypass graft  1996    with a LIMA to the LAD, SVG to RCA and SVG to OM  . Coronary stent placement  2009     LCX  . Appendectomy      Age 33  . Colonoscopy  2008  . Tonsillectomy    . Cardioversion N/A 08/22/2013    Procedure: CARDIOVERSION;  Surgeon: Carlena Bjornstad, MD;  Location: [facility] ENDOSCOPY;  Service: Cardiovascular;  Laterality: N/A;  . Cardioversion N/A 01/16/2014    Procedure: CARDIOVERSION;  Surgeon: Lelon Perla, MD;  Location: [facility] ENDOSCOPY;  Service: Cardiovascular;  Laterality: N/A;     Medications: Current Outpatient Prescriptions  Medication Sig Dispense Refill  . acetaminophen (TYLENOL) 325 MG tablet Take 325 mg by mouth every 4 (four) hours as needed for pain.    [name] albuterol (PROAIR HFA) 108 (90 BASE) MCG/ACT inhaler Inhale 2 puffs into the lungs every 4 (four) hours as needed for wheezing or shortness of breath (and prior to exercise). 1 Inhaler 3  . b complex vitamins tablet Take 1 tablet by mouth daily.    . cetirizine (ZYRTEC) 10 MG tablet Take 10 mg by mouth.    . dofetilide (TIKOSYN) 250 MCG capsule  TAKE 1 CAPSULE BY MOUTH EVERY 12 HOURS 180 capsule 0  . furosemide (LASIX) 40 MG tablet One tablet daily 90 tablet 0  . isosorbide mononitrate (IMDUR) 60 MG 24 hr tablet TAKE 1 TABLET BY MOUTH EVERY DAY 90 tablet 4  . lactobacillus acidophilus & bulgar (LACTINEX) chewable tablet Chew 2 tablets by mouth daily.     [name] LORazepam (ATIVAN) 0.5 MG tablet Take 0.5 mg by mouth at bedtime as needed.     . magnesium oxide (MAG-OX) 400 MG tablet Take 400 mg by mouth daily.      . nebivolol (BYSTOLIC) 10 MG tablet Take 1 tablet (10 mg total) by mouth daily. 30 tablet 5  . potassium chloride SA (  K-DUR,KLOR-CON) 20 MEQ tablet Take 20 mEq by mouth 2 (two) times daily.    . ranitidine (ZANTAC) 300 MG tablet TAKE 1 TABLET BY MOUTH EVERY DAY 30 tablet 3  . rosuvastatin (CRESTOR) 40 MG tablet Take 1 tablet (40 mg total) by mouth daily. 30 tablet 3  . Testosterone 10 MG/ACT (2%) GEL Place 15 % onto the skin daily.    . traZODone (DESYREL) 50 MG tablet Take 50 mg by mouth at bedtime.    . Vitamin D, Ergocalciferol, (DRISDOL) 50000 UNITS CAPS capsule Take 50,000 Units by mouth as directed. Pt taking twice a month    . XARELTO 20 MG TABS tablet TAKE 1 TABLET BY MOUTH ONCE DAILY 30 tablet 6   No current facility-administered medications for this visit.    Allergies: Allergies  Allergen Reactions  . Morphine And Related Other (See Comments)    IV forms- vein irritation IV forms- vein irritation    Social History: The patient  reports that he quit smoking about 49 years ago. His smoking use included Cigarettes. He has a 2.5 pack-year smoking history. He has never used smokeless tobacco. He reports that he does not drink alcohol or use illicit drugs.   Family History: The patient's family history includes Heart Problems in his brother; Heart disease in his father and mother. There is no history of Colon cancer.   Review of Systems: Please see the history of present illness.   Otherwise, the review of systems is  positive for none.   All other systems are reviewed and negative.   Physical Exam: VS:  BP 150/70 mmHg  Pulse 64  Ht 5\' 6"  (1.676 m)  Wt 133 lb 12.8 oz (60.691 kg)  BMI 21.61 kg/m2 .  BMI Body mass index is 21.61 kg/(m^2).  Wt Readings from Last 3 Encounters:  03/02/16 133 lb 12.8 oz (60.691 kg)  11/17/15 134 lb (60.782 kg)  09/02/15 139 lb (63.05 kg)   BP by me is down to 140/70  General: Pleasant. Well developed, well nourished and in no acute distress. He has lost weight intentionally.  HEENT: Normal. Neck: Supple, no JVD, carotid bruits, or masses noted.  Cardiac: Regular rate and rhythm. No murmurs, rubs, or gallops. No edema.  Respiratory:  Lungs are clear to auscultation bilaterally with normal work of breathing.  GI: Soft and nontender.  MS: No deformity or atrophy. Gait and ROM intact. Skin: Warm and dry. Color is normal.  Neuro:  Strength and sensation are intact and no gross focal deficits noted.  Psych: Alert, appropriate and with normal affect.   LABORATORY DATA:  EKG:  EKG is ordered today. This shows NSR - rate is 60. QT is 396  Lab Results  Component Value Date   WBC 5.9 03/06/2014   HGB 14.0 03/06/2014   HCT 42.2 03/06/2014   PLT 166.0 03/06/2014   GLUCOSE 77 09/02/2015   CHOL 162 07/19/2014   TRIG 88.0 07/19/2014   HDL 48.20 07/19/2014   LDLCALC 96 07/19/2014   ALT 30 08/19/2014   AST 36 08/19/2014   NA 138 09/02/2015   K 4.4 09/02/2015   CL 102 09/02/2015   CREATININE 1.27 09/02/2015   BUN 20 09/02/2015   CO2 30 09/02/2015   TSH 1.62 03/25/2014   INR 2.6* 01/15/2014    BNP (last 3 results) No results for input(s): BNP in the last 8760 hours.  ProBNP (last 3 results)  Recent Labs  04/29/15 0909 09/02/15 0956  PROBNP 159.0* 225.0*  Other Studies Reviewed Today:  Myoview Impression from 10/2014 Exercise Capacity: Lexiscan with no exercise. BP Response: Normal blood pressure response Clinical Symptoms: Typical symptoms with  Lexiscan. ECG Impression: No significant ST segment change suggestive of ischemia. Comparison with Prior Nuclear Study: No images to compare  Overall Impression: Low risk stress nuclear study with no evidence of ischemia. [name] basal inferoseptal fixed defect, possible infarct.  LV Ejection Fraction: 61%. LV Wall Motion: Septal wall hypokinesis  Candee Furbish, MD    Echo Study Conclusions from 03/2014  - Left ventricle: The cavity size was normal. There was mild focal basal hypertrophy of the septum. Systolic function was normal. The estimated ejection fraction was in the range of 60% to 65%. There is akinesis of the basalinferior myocardium. - Mitral valve: Calcified annulus. Mildly thickened leaflets . Mild regurgitation. - Left atrium: The atrium was moderately to severely dilated. - Right atrium: The atrium was mildly dilated. Impressions:  - Compared to 08/13/13, LV function remains normal; biatrial enlargement; MR now appears mild.  Assessment/Plan:  1. Atrial fibrillation: He remains in NSR - on Tikosyn. Recent potassium was 4.6. Needs MG level today. Remains on anticoagulation.  2. CAD: He is not taking aspirin as he has stable CAD and is on Xarelto. Continue nebivolol and Crestor. Cardiolite from 10/2014 showed no ischemia.   3. Hyperlipidemia: He is taking Crestor 40 mg daily. Lipids are followed by PCP.Recent check looks good. Labs scanned in.   4. Chronic diastolic CHF: NYHA class II symptoms - he seems stable to me. He is happy with how he is doing. Would hold off on further testing for now.   5. Pulmonary: PFTs suggested both a restrictive and obstructive defect. The restrictive defect is likely from post-polio syndrome and elevated left hemidiaphragm. Mr Vanveen never smoked much, so the obstructive defect is more surprising. He is followed by pulmonary. No change in symptoms.   Current medicines are reviewed with the patient today.  The  patient does not have concerns regarding medicines other than what has been noted above.  The following changes have been made:  See above.  Labs/ tests ordered today include:    Orders Placed This Encounter  Procedures  . Magnesium  . EKG 12-Lead     Disposition:   FU with me in 6 months.   Patient is agreeable to this plan and will call if any problems develop in the interim.   Signed: Burtis Junes, RN, ANP-C 03/02/2016 9:45 AM  [facility] [address] [place], [place]  [address] Phone: [phone] Fax: [phone]

## 2016-03-04 DIAGNOSIS — Z1212 Encounter for screening for malignant neoplasm of rectum: Secondary | ICD-10-CM | POA: Diagnosis not present

## 2016-03-20 ENCOUNTER — Other Ambulatory Visit: Payer: Self-pay | Admitting: Cardiology

## 2016-03-23 ENCOUNTER — Other Ambulatory Visit: Payer: Self-pay | Admitting: Cardiology

## 2016-05-15 ENCOUNTER — Other Ambulatory Visit: Payer: Self-pay | Admitting: Cardiology

## 2016-05-17 NOTE — Telephone Encounter (Signed)
Rx(s) sent to pharmacy electronically.  

## 2016-05-18 DIAGNOSIS — I10 Essential (primary) hypertension: Secondary | ICD-10-CM | POA: Diagnosis not present

## 2016-05-18 DIAGNOSIS — H18412 Arcus senilis, left eye: Secondary | ICD-10-CM | POA: Diagnosis not present

## 2016-05-18 DIAGNOSIS — H2511 Age-related nuclear cataract, right eye: Secondary | ICD-10-CM | POA: Diagnosis not present

## 2016-05-18 DIAGNOSIS — H2512 Age-related nuclear cataract, left eye: Secondary | ICD-10-CM | POA: Diagnosis not present

## 2016-05-19 ENCOUNTER — Other Ambulatory Visit: Payer: Self-pay | Admitting: Cardiology

## 2016-05-28 DIAGNOSIS — L821 Other seborrheic keratosis: Secondary | ICD-10-CM | POA: Diagnosis not present

## 2016-05-28 DIAGNOSIS — L570 Actinic keratosis: Secondary | ICD-10-CM | POA: Diagnosis not present

## 2016-05-28 DIAGNOSIS — D2372 Other benign neoplasm of skin of left lower limb, including hip: Secondary | ICD-10-CM | POA: Diagnosis not present

## 2016-05-28 DIAGNOSIS — D2262 Melanocytic nevi of left upper limb, including shoulder: Secondary | ICD-10-CM | POA: Diagnosis not present

## 2016-05-28 DIAGNOSIS — D485 Neoplasm of uncertain behavior of skin: Secondary | ICD-10-CM | POA: Diagnosis not present

## 2016-05-28 DIAGNOSIS — L814 Other melanin hyperpigmentation: Secondary | ICD-10-CM | POA: Diagnosis not present

## 2016-05-28 DIAGNOSIS — C44319 Basal cell carcinoma of skin of other parts of face: Secondary | ICD-10-CM | POA: Diagnosis not present

## 2016-06-01 ENCOUNTER — Other Ambulatory Visit: Payer: Self-pay | Admitting: Cardiology

## 2016-06-19 ENCOUNTER — Other Ambulatory Visit: Payer: Self-pay | Admitting: Nurse Practitioner

## 2016-07-02 DIAGNOSIS — Z85828 Personal history of other malignant neoplasm of skin: Secondary | ICD-10-CM | POA: Diagnosis not present

## 2016-07-02 DIAGNOSIS — C44319 Basal cell carcinoma of skin of other parts of face: Secondary | ICD-10-CM | POA: Diagnosis not present

## 2016-08-31 ENCOUNTER — Encounter: Payer: Self-pay | Admitting: Nurse Practitioner

## 2016-08-31 ENCOUNTER — Ambulatory Visit (INDEPENDENT_AMBULATORY_CARE_PROVIDER_SITE_OTHER): Payer: Medicare Other | Admitting: Nurse Practitioner

## 2016-08-31 VITALS — BP 120/64 | HR 65 | Ht 66.0 in | Wt 130.1 lb

## 2016-08-31 DIAGNOSIS — I480 Paroxysmal atrial fibrillation: Secondary | ICD-10-CM | POA: Diagnosis not present

## 2016-08-31 DIAGNOSIS — Z79899 Other long term (current) drug therapy: Secondary | ICD-10-CM

## 2016-08-31 DIAGNOSIS — I5032 Chronic diastolic (congestive) heart failure: Secondary | ICD-10-CM | POA: Diagnosis not present

## 2016-08-31 NOTE — Patient Instructions (Addendum)
We will be checking the following labs today - NONE   Medication Instructions:    Continue with your current medicines.     Testing/Procedures To Be Arranged:  N/A  Follow-Up:   See me in 6 months    Other Special Instructions:   Let me know if you'd like to try a change/alteration in your medicines in the next few months.    If you need a refill on your cardiac medications before your next appointment, please call your pharmacy.   Call the [place] office at [phone] if you have any questions, problems or concerns.

## 2016-08-31 NOTE — Progress Notes (Signed)
CARDIOLOGY OFFICE NOTE  Date:  08/31/2016    [name] Date of Birth: [birth year] Medical Record [id number]  PCP:  Geoffery Lyons, MD  Cardiologist:  Annabell Howells  Chief Complaint  Patient presents with  . Atrial Fibrillation  . Coronary Artery Disease  . Hyperlipidemia  . Congestive Heart Failure    6 month check - seen for [name]    History of Present Illness: [name] is a 80 y.o. male who presents today for a 6 month check. He is seen for [name]. Former patient of Dr. [name].   He has known CAD with MI dating back to 1988, PCI in 1996 with subsequent past CABG in 1996 due to restenosis. Stents to the LCX in 2009. Last cath dates back to 2009. Had a SVG to the distal RCA patent and the CFX system was patent. LIMA was atretic and there were serial 60 to 80% stenoses in the native LAD. He has been managed medically. He was previously on chronic Ranexa but stopped due to risk of QT prolongation with Tikosyn.   Other issues include GERD, recent herpes zoster with post herpetic neuralgia, post polio syndrome, PVCs, HLD, colonic polyps and prior brainstem stroke with residual Horner syndrome in 1996 following cardiac cath.   Echo in 03/2014 showed EF 60 to 65% with mild MR and biatrial enlargement. Lexiscan Cardiolite in 11/15 showed no ischemia.   He was initially noted to have atrial fibrillation in the summer of 2014. He was started on Xarelto and cardioverted to NSR in 8/14. Recurrent atrial fibrillation was noted in 1/15, and he was cardioverted to NSR again. This time, NSR did not hold long. At last appointment in 5/15, he was in persistent atrial fibrillation.He was referred to [facility] where he had atrial fibrillation ablation and Tikosyn loading. Ranolazine was stopped due to risk of QT prolongation and Imdur was started as an anti-anginal instead.  Last seen by [name] in March of 2016 - had previously been started on Lasix. Continued to  have some exertional dyspnea. Lasix was increased due to the DOE. He was in NSR. BNP not really all that impressive.   CTA chest done in 2/16 to look for evidence for PV stenosis post-AF ablation. This showed mild short-segment narrowing of the left inferior pulmonary vein.   I last saw him in March  - he had stable DOE. He was happy with how he was doing. Lipids checked by PCP.  Felt like his rhythm has been ok. He had been released by EP at [facility].  Comes back today. Here with his wife. Getting ready to have cataract surgery. Notes that he stays cold all the time. This is a chronic issue from his Raynaud's. He actually uses a heated keyboard at work. Working 3 days a week. Breathing is stable. No chest pain. Rhythm has been ok. He is pleased with how he is doing other than the coldness of his hands/feet. Previously on CCB therapy. Notes he has read some research article about using an ED drug daily with long acting nitrate to improve blood flow. Asking for my thoughts. Having labs next week with [name].    PMH: 1. CAD: 1st MI in 56. CABG 1996. PCI to CFX in 2009. LHC (11/09) SVG-dRCA patent, total occlusion RCA, patent CFX stent, atretic LIMA, serial 60 and 80% proximal LAD stenoses, EF 60% with basal inferior hypokinesis. Myoview in 2011 with no ischemia or infarction. Echo (10/12)  with EF 55-60%, mild LVH, mild MR. Lexiscan Cardiolite in 2013 with no ischemia or infarction. Echo (8/14) with EF 55-60%, moderate MR, moderate TR, PA systolic pressure 35 mmHg. Echo (4/15) with EF 60-65%, mild focal basal septal hypertrophy, inferior basal akinesis, mild MR. Lexiscan cardiolite (11/15) with EF 61%, fixed basal inferoseptal defect, no ischemia.  2. Raynauds 3. Post-polio syndrome 4. GERD with dilation of esophageal stricture in 12/12.  5. Hyperlipidemia 6. Brainstem stroke with Horner's syndrome.  7. Scoliosis. 8. H/o appendectomy 9. Herpes Zoster 10. Atrial fibrillation: DCCV  to NSR in 8/14. DCCV to NSR in 1/15. Atrial fibrillation ablation 6/15 with Tikosyn loading (at [facility]).  11. PFTs (4/15) with FVC 59%, FEV1 54%, ratio 91%, DLCO 53% => moderate obstructive defect thought to be related to COPD and severe restrictive defect thought to be due to elevated left hemidiaphragm and post-polio syndrome.  12. Chronic diastolic CHF.    Past Medical History:  Diagnosis Date  . Abnormal nuclear cardiac imaging test March 2011   Has positive EKG response, no perfusion defect and normal EF  . Adenomatous colon polyp 12/2002  . Brainstem stroke ([place]) 1996   with residual horner syndrome  . Coronary artery disease    First MI in 1988. PCI in 1996 with subsequent CABG in 1996 due to restenosis. S/P stents to LCX in 2009. Noted to have residual disease in the LAD in a diffuse manner and atretic LIMA graft. He is managed medically.   [name] GERD (gastroesophageal reflux disease)   . 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 History of Raynaud's syndrome   . Hyperlipidemia   . Internal hemorrhoids   . Persistent atrial fibrillation ([place])   . PVC (premature ventricular contraction)   . Scoliosis    mild    Past Surgical History:  Procedure Laterality Date  . APPENDECTOMY     Age 62  . CARDIOVERSION N/A 08/22/2013   Procedure: CARDIOVERSION;  Surgeon: Carlena Bjornstad, MD;  Location: [facility] ENDOSCOPY;  Service: Cardiovascular;  Laterality: N/A;  . CARDIOVERSION N/A 01/16/2014   Procedure: CARDIOVERSION;  Surgeon: Lelon Perla, MD;  Location: [facility] ENDOSCOPY;  Service: Cardiovascular;  Laterality: N/A;  . COLONOSCOPY  2008  . CORONARY ARTERY BYPASS GRAFT  1996   with a LIMA to the LAD, SVG to RCA and SVG to OM  . CORONARY STENT PLACEMENT  2009    LCX  . TONSILLECTOMY       Medications: Current Outpatient Prescriptions  Medication Sig Dispense Refill  . acetaminophen (TYLENOL) 325 MG tablet Take 325 mg by mouth every 4 (four) hours as needed for pain.    [name] albuterol  (PROAIR HFA) 108 (90 BASE) MCG/ACT inhaler Inhale 2 puffs into the lungs every 4 (four) hours as needed for wheezing or shortness of breath (and prior to exercise). 1 Inhaler 3  . BYSTOLIC 10 MG tablet TAKE 1 TABLET BY MOUTH EVERY DAY 30 tablet 11  . cetirizine (ZYRTEC) 10 MG tablet Take 10 mg by mouth.    . dofetilide (TIKOSYN) 250 MCG capsule TAKE 1 CAPSULE BY MOUTH EVERY 12 HOURS 180 capsule 2  . furosemide (LASIX) 40 MG tablet Take 1 tablet (40 mg total) by mouth daily. 100 tablet 0  . isosorbide mononitrate (IMDUR) 60 MG 24 hr tablet TAKE 1 TABLET BY MOUTH EVERY DAY 90 tablet 4  . KLOR-CON M20 20 MEQ tablet ALTERNATE BY TAKING 2 TABLETS DAILY WITH TAKING 1 TABLET DAILY 150 tablet  1  . lactobacillus acidophilus & bulgar (LACTINEX) chewable tablet Chew 2 tablets by mouth daily.     [name] LORazepam (ATIVAN) 0.5 MG tablet Take 0.5 mg by mouth at bedtime as needed.     . magnesium oxide (MAG-OX) 400 MG tablet Take 400 mg by mouth daily.      . ranitidine (ZANTAC) 300 MG tablet TAKE 1 TABLET BY MOUTH EVERY DAY 30 tablet 3  . rosuvastatin (CRESTOR) 40 MG tablet TAKE 1 TABLET BY MOUTH EVERY DAY 90 tablet 3  . Testosterone 10 MG/ACT (2%) GEL Place 15 % onto the skin daily.    . traZODone (DESYREL) 50 MG tablet Take 50 mg by mouth at bedtime.    . Vitamin D, Ergocalciferol, (DRISDOL) 50000 UNITS CAPS capsule Take 50,000 Units by mouth as directed. Pt taking twice a month    . XARELTO 20 MG TABS tablet TAKE 1 TABLET BY MOUTH ONCE DAILY 30 tablet 9   No current facility-administered medications for this visit.     Allergies: Allergies  Allergen Reactions  . Morphine And Related Other (See Comments)    IV forms- vein irritation IV forms- vein irritation    Social History: The patient  reports that he quit smoking about 49 years ago. His smoking use included Cigarettes. He has a 2.50 pack-year smoking history. He has never used smokeless tobacco. He reports that he does not drink alcohol or use  drugs.   Family History: The patient's family history includes Heart Problems in his brother; Heart disease in his father and mother.   Review of Systems: Please see the history of present illness.   Otherwise, the review of systems is positive for none.   All other systems are reviewed and negative.   Physical Exam: VS:  BP 120/64   Pulse 65   Ht 5\' 6"  (1.676 m)   Wt 130 lb 1.9 oz (59 kg)   BMI 21.00 kg/m  .  BMI Body mass index is 21 kg/m.  Wt Readings from Last 3 Encounters:  08/31/16 130 lb 1.9 oz (59 kg)  03/02/16 133 lb 12.8 oz (60.7 kg)  11/17/15 134 lb (60.8 kg)    General: Pleasant. Thin. Elderly male who looks younger than his stated age. He is alert and in no acute distress.   HEENT: Normal.  Neck: Supple, no JVD, carotid bruits, or masses noted.  Cardiac: Regular rate and rhythm. Occasional ectopic. No murmurs, rubs, or gallops. No edema.  Respiratory:  Lungs are clear to auscultation bilaterally with normal work of breathing.  GI: Soft and nontender.  MS: No deformity or atrophy. Gait and ROM intact.  Skin: Warm and dry. Color is normal.  Neuro:  Strength and sensation are intact and no gross focal deficits noted.  Psych: Alert, appropriate and with normal affect.   LABORATORY DATA:  EKG:  EKG is ordered today. This demonstrates NSR.  Lab Results  Component Value Date   WBC 5.9 03/06/2014   HGB 14.0 03/06/2014   HCT 42.2 03/06/2014   PLT 166.0 03/06/2014   GLUCOSE 77 09/02/2015   CHOL 162 07/19/2014   TRIG 88.0 07/19/2014   HDL 48.20 07/19/2014   LDLCALC 96 07/19/2014   ALT 30 08/19/2014   AST 36 08/19/2014   NA 138 09/02/2015   K 4.4 09/02/2015   CL 102 09/02/2015   CREATININE 1.27 09/02/2015   BUN 20 09/02/2015   CO2 30 09/02/2015   TSH 1.62 03/25/2014   INR 2.6 (H) 01/15/2014  BNP (last 3 results) No results for input(s): BNP in the last 8760 hours.  ProBNP (last 3 results)  Recent Labs  09/02/15 0956  PROBNP 225.0*     Other  Studies Reviewed Today:  Myoview Impression from 10/2014 Exercise Capacity: Lexiscan with no exercise. BP Response: Normal blood pressure response Clinical Symptoms: Typical symptoms with Lexiscan. ECG Impression: No significant ST segment change suggestive of ischemia. Comparison with Prior Nuclear Study: No images to compare  Overall Impression: Low risk stress nuclear study with no evidence of ischemia. [name] basal inferoseptal fixed defect, possible infarct.  LV Ejection Fraction: 61%. LV Wall Motion: Septal wall hypokinesis  Candee Furbish, MD    Echo Study Conclusions from 03/2014  - Left ventricle: The cavity size was normal. There was mild focal basal hypertrophy of the septum. Systolic function was normal. The estimated ejection fraction was in the range of 60% to 65%. There is akinesis of the basalinferior myocardium. - Mitral valve: Calcified annulus. Mildly thickened leaflets . Mild regurgitation. - Left atrium: The atrium was moderately to severely dilated. - Right atrium: The atrium was mildly dilated. Impressions:  - Compared to 08/13/13, LV function remains normal; biatrial enlargement; MR now appears mild.  Assessment/Plan:  1. Atrial fibrillation: He remains in NSR - on Tikosyn.  Remains on anticoagulation.  2. CAD: He is not taking aspirin as he has stable CAD and is on Xarelto. Continue nebivolol and Crestor. Cardiolite from 10/2014 showed no ischemia.   3. Hyperlipidemia: He is taking Crestor 40 mg daily. Lipids are followed by PCP.Recent check looks good. Labs scanned in.   4. Chronic diastolic CHF: NYHA class II symptoms - he seems stable to me. He is happy with how he is doing. Would hold off on further testing for now.   5. Pulmonary: PFTs suggested both a restrictive and obstructive defect. The restrictive defect is likely from post-polio syndrome and elevated left hemidiaphragm. Mr Saputo never smoked much, so the  obstructive defect is more surprising. He is followed by pulmonary. No change in symptoms.   6. Raynaud's - he may want to consider trying to get back on CCB therapy - we could certainly try to cut his dose of beta blocker and Imdur to let his BP be ok if this was added.   Current medicines are reviewed with the patient today.  The patient does not have concerns regarding medicines other than what has been noted above.  The following changes have been made:  See above.  Labs/ tests ordered today include:    Orders Placed This Encounter  Procedures  . EKG 12-Lead     Disposition:   FU with me in 6 months.   Patient is agreeable to this plan and will call if any problems develop in the interim.   Signed: Burtis Junes, RN, ANP-C 08/31/2016 9:40 AM  [facility] [address] [place], [place]  [address] Phone: [phone] Fax: [phone]

## 2016-09-06 DIAGNOSIS — M859 Disorder of bone density and structure, unspecified: Secondary | ICD-10-CM | POA: Diagnosis not present

## 2016-09-06 DIAGNOSIS — J302 Other seasonal allergic rhinitis: Secondary | ICD-10-CM | POA: Diagnosis not present

## 2016-09-06 DIAGNOSIS — E784 Other hyperlipidemia: Secondary | ICD-10-CM | POA: Diagnosis not present

## 2016-09-06 DIAGNOSIS — I8390 Asymptomatic varicose veins of unspecified lower extremity: Secondary | ICD-10-CM | POA: Diagnosis not present

## 2016-09-06 DIAGNOSIS — G4700 Insomnia, unspecified: Secondary | ICD-10-CM | POA: Diagnosis not present

## 2016-09-06 DIAGNOSIS — N183 Chronic kidney disease, stage 3 (moderate): Secondary | ICD-10-CM | POA: Diagnosis not present

## 2016-09-06 DIAGNOSIS — H2513 Age-related nuclear cataract, bilateral: Secondary | ICD-10-CM | POA: Diagnosis not present

## 2016-09-06 DIAGNOSIS — E291 Testicular hypofunction: Secondary | ICD-10-CM | POA: Diagnosis not present

## 2016-09-06 DIAGNOSIS — I10 Essential (primary) hypertension: Secondary | ICD-10-CM | POA: Diagnosis not present

## 2016-09-06 DIAGNOSIS — I2510 Atherosclerotic heart disease of native coronary artery without angina pectoris: Secondary | ICD-10-CM | POA: Diagnosis not present

## 2016-09-06 DIAGNOSIS — H2511 Age-related nuclear cataract, right eye: Secondary | ICD-10-CM | POA: Diagnosis not present

## 2016-09-06 DIAGNOSIS — I480 Paroxysmal atrial fibrillation: Secondary | ICD-10-CM | POA: Diagnosis not present

## 2016-09-06 DIAGNOSIS — Z6820 Body mass index (BMI) 20.0-20.9, adult: Secondary | ICD-10-CM | POA: Diagnosis not present

## 2016-09-13 ENCOUNTER — Other Ambulatory Visit: Payer: Self-pay | Admitting: Cardiology

## 2016-09-14 DIAGNOSIS — H2511 Age-related nuclear cataract, right eye: Secondary | ICD-10-CM | POA: Diagnosis not present

## 2016-09-15 DIAGNOSIS — H2512 Age-related nuclear cataract, left eye: Secondary | ICD-10-CM | POA: Diagnosis not present

## 2016-09-21 DIAGNOSIS — H2512 Age-related nuclear cataract, left eye: Secondary | ICD-10-CM | POA: Diagnosis not present

## 2016-09-22 ENCOUNTER — Other Ambulatory Visit: Payer: Self-pay | Admitting: Cardiology

## 2016-10-26 DIAGNOSIS — Z23 Encounter for immunization: Secondary | ICD-10-CM | POA: Diagnosis not present

## 2016-10-27 ENCOUNTER — Encounter: Payer: Self-pay | Admitting: Gastroenterology

## 2016-10-29 DIAGNOSIS — E291 Testicular hypofunction: Secondary | ICD-10-CM | POA: Diagnosis not present

## 2016-11-09 ENCOUNTER — Encounter: Payer: Self-pay | Admitting: Nurse Practitioner

## 2016-11-09 NOTE — Progress Notes (Signed)
Saw Mr. Krim when I went to his pharmacy to pick up a RX.  Has had his Mg level drawn - it was 2.4.   Also wanted to let me know that he had changed the base of his testosterone and was monitoring the blood levels.   Burtis Junes, RN, [place] [address] [place], [place]  [address] [phone]

## 2017-01-05 ENCOUNTER — Telehealth: Payer: Self-pay

## 2017-01-05 NOTE — Telephone Encounter (Signed)
Prior auth for Bystolic 10mg  obtained from [facility]. [id number]. Local pharmacy aware.

## 2017-01-19 ENCOUNTER — Encounter: Payer: Self-pay | Admitting: Gastroenterology

## 2017-02-16 DIAGNOSIS — H26493 Other secondary cataract, bilateral: Secondary | ICD-10-CM | POA: Diagnosis not present

## 2017-02-16 DIAGNOSIS — H18413 Arcus senilis, bilateral: Secondary | ICD-10-CM | POA: Diagnosis not present

## 2017-02-16 DIAGNOSIS — I10 Essential (primary) hypertension: Secondary | ICD-10-CM | POA: Diagnosis not present

## 2017-02-16 DIAGNOSIS — Z961 Presence of intraocular lens: Secondary | ICD-10-CM | POA: Diagnosis not present

## 2017-02-16 DIAGNOSIS — H26491 Other secondary cataract, right eye: Secondary | ICD-10-CM | POA: Diagnosis not present

## 2017-02-28 ENCOUNTER — Encounter (INDEPENDENT_AMBULATORY_CARE_PROVIDER_SITE_OTHER): Payer: Self-pay

## 2017-02-28 ENCOUNTER — Encounter: Payer: Self-pay | Admitting: Gastroenterology

## 2017-02-28 ENCOUNTER — Ambulatory Visit (INDEPENDENT_AMBULATORY_CARE_PROVIDER_SITE_OTHER): Payer: Medicare Other | Admitting: Gastroenterology

## 2017-02-28 VITALS — BP 110/60 | HR 68 | Ht 65.35 in | Wt 129.2 lb

## 2017-02-28 DIAGNOSIS — K219 Gastro-esophageal reflux disease without esophagitis: Secondary | ICD-10-CM

## 2017-02-28 DIAGNOSIS — Z86010 Personal history of colonic polyps: Secondary | ICD-10-CM | POA: Diagnosis not present

## 2017-02-28 NOTE — Patient Instructions (Signed)
Please call back if you decide you want to schedule a Colonoscopy or if your swallowing gets worse.   Thank you for choosing me and [facility].  Pricilla Riffle. Dagoberto Ligas., MD., Marval Regal

## 2017-02-28 NOTE — Progress Notes (Signed)
History of Present Illness: This is a 81 year male self referred for the evaluation of a personal history of adenomatous colon polyps. He is accompanied by his wife. He is maintained on Xarelto. Last colonoscopy in 11/2011, no polyps, only hemorrhoids. He notes occasional diarrhea for 2-3 years. Not sure what brings it on but it resolves in 1 day. His GERD is controlled on ranitidine daily. He has noted brief dysphagia with solids on rare occasions. Denies weight loss, abdominal pain, constipation, change in stool caliber, melena, hematochezia, nausea, vomiting, chest pain.  Allergies  Allergen Reactions  . Morphine And Related Other (See Comments)    IV forms- vein irritation IV forms- vein irritation   Outpatient Medications Prior to Visit  Medication Sig Dispense Refill  . acetaminophen (TYLENOL) 325 MG tablet Take 325 mg by mouth every 4 (four) hours as needed for pain.    [name] albuterol (PROAIR HFA) 108 (90 BASE) MCG/ACT inhaler Inhale 2 puffs into the lungs every 4 (four) hours as needed for wheezing or shortness of breath (and prior to exercise). 1 Inhaler 3  . BYSTOLIC 10 MG tablet TAKE 1 TABLET BY MOUTH EVERY DAY 30 tablet 11  . cetirizine (ZYRTEC) 10 MG tablet Take 10 mg by mouth.    . dofetilide (TIKOSYN) 250 MCG capsule TAKE 1 CAPSULE BY MOUTH EVERY 12 HOURS 180 capsule 2  . furosemide (LASIX) 40 MG tablet Take 1 tablet 40 mg total by mouth daily. 90 tablet 3  . isosorbide mononitrate (IMDUR) 60 MG 24 hr tablet TAKE 1 TABLET BY MOUTH EVERY DAY 90 tablet 4  . KLOR-CON M20 20 MEQ tablet TAKE 2 TABLETS BY MOUTH DAILY ALTERNATING WITH 1 TABLET DAILY AS DIRECTED 150 tablet 3  . lactobacillus acidophilus & bulgar (LACTINEX) chewable tablet Chew 2 tablets by mouth daily.     [name] LORazepam (ATIVAN) 0.5 MG tablet Take 0.5 mg by mouth at bedtime as needed.     . magnesium oxide (MAG-OX) 400 MG tablet Take 400 mg by mouth daily.      . ranitidine (ZANTAC) 300 MG tablet TAKE 1 TABLET BY MOUTH  EVERY DAY 30 tablet 3  . rosuvastatin (CRESTOR) 40 MG tablet TAKE 1 TABLET BY MOUTH EVERY DAY 90 tablet 3  . traZODone (DESYREL) 50 MG tablet Take 50 mg by mouth at bedtime.    . Vitamin D, Ergocalciferol, (DRISDOL) 50000 UNITS CAPS capsule Take 50,000 Units by mouth as directed. Pt taking twice a month    . XARELTO 20 MG TABS tablet TAKE 1 TABLET BY MOUTH ONCE DAILY 30 tablet 9  . Testosterone 10 MG/ACT (2%) GEL Place 15 % onto the skin daily.     No facility-administered medications prior to visit.    Past Medical History:  Diagnosis Date  . Abnormal nuclear cardiac imaging test March 2011   Has positive EKG response, no perfusion defect and normal EF  . Adenomatous colon polyp 12/2002  . Atrial fibrillation ([place])   . Brainstem stroke ([place]) 1996   with residual horner syndrome  . CHF (congestive heart failure) ([place])   . COPD (chronic obstructive pulmonary disease) ([place])   . Coronary artery disease    First MI in 1988. PCI in 1996 with subsequent CABG in 1996 due to restenosis. S/P stents to LCX in 2009. Noted to have residual disease in the LAD in a diffuse manner and atretic LIMA graft. He is managed medically.   . Esophageal stricture   . GERD (gastroesophageal  reflux disease)   . 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 History of Raynaud's syndrome   . Hyperlipidemia   . Internal hemorrhoids   . Persistent atrial fibrillation ([place])   . Polio    age 34  . PVC (premature ventricular contraction)   . Scoliosis    mild   Past Surgical History:  Procedure Laterality Date  . APPENDECTOMY     Age 82  . CARDIOVERSION N/A 08/22/2013   Procedure: CARDIOVERSION;  Surgeon: Carlena Bjornstad, MD;  Location: [facility] ENDOSCOPY;  Service: Cardiovascular;  Laterality: N/A;  . CARDIOVERSION N/A 01/16/2014   Procedure: CARDIOVERSION;  Surgeon: Lelon Perla, MD;  Location: [facility] ENDOSCOPY;  Service: Cardiovascular;  Laterality: N/A;  . COLONOSCOPY  2008  . CORONARY ARTERY BYPASS GRAFT  1996     with a LIMA to the LAD, SVG to RCA and SVG to OM  . CORONARY STENT PLACEMENT  2009    LCX  . TONSILLECTOMY     Social History   Social History  . Marital status: Married    Spouse name: N/A  . Number of children: 2  . Years of education: N/A   Occupational History  . Pharmacist    Social History Main Topics  . Smoking status: Former Smoker    Packs/day: 0.50    Years: 5.00    Types: Cigarettes    Quit date: 12/27/1966  . Smokeless tobacco: Never Used  . Alcohol use No  . Drug use: No  . Sexual activity: Yes   Other Topics Concern  . 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 Heart disease Mother   . Heart disease Father   . Heart Problems Brother     heart transplant  . Heart disease Maternal Grandmother   . Heart disease Maternal Grandfather   . Heart disease Paternal Grandmother   . Heart disease Paternal Grandfather   . Prostate cancer Paternal Uncle   . Colon cancer Neg Hx       Review of Systems: Pertinent positive and negative review of systems were noted in the above HPI section. All other review of systems were otherwise negative.  Physical Exam: General: Well developed, well nourished, no acute distress Head: Normocephalic and atraumatic Eyes:  sclerae anicteric, EOMI Ears: Normal auditory acuity Mouth: No deformity or lesions Neck: Supple, no masses or thyromegaly Lungs: Clear throughout to auscultation Heart: Regular rate and rhythm; no murmurs, rubs or bruits Abdomen: Soft, non tender and non distended. No masses, hepatosplenomegaly or hernias noted. Normal Bowel sounds Musculoskeletal: Symmetrical with no gross deformities  Skin: No lesions on visible extremities Pulses:  Normal pulses noted Extremities: No clubbing, cyanosis, edema or deformities noted Neurological: Alert oriented x 4, grossly nonfocal Cervical  Nodes:  No significant cervical adenopathy Inguinal Nodes: No significant inguinal adenopathy Psychological:  Alert and cooperative. Normal mood and affect  Assessment and Recommendations:  1. Personal history of adenomatous colon polyps. Occasional diarrhea. Discussed colonoscopy and he is not sure he wants to proceed at his age, with Xarelto use and especially since last colonoscopy was polyp free. I think it is reasonable to proceed with 1 more colonoscopy or to to discontinue surveillance colonoscopies-his choice. He will call us if he changes his mind, his diarrhea worsens or he develops any new GI  symptoms.   2. GERD with history of an esophageal stricture. Continue ranitidine 300 mg daily. Advised to call us if his dysphagia or GERD worsens and we can change to a PPI and consider EGD.

## 2017-03-01 ENCOUNTER — Ambulatory Visit: Payer: Medicare Other | Admitting: Nurse Practitioner

## 2017-03-02 DIAGNOSIS — Z961 Presence of intraocular lens: Secondary | ICD-10-CM | POA: Diagnosis not present

## 2017-03-02 DIAGNOSIS — H26492 Other secondary cataract, left eye: Secondary | ICD-10-CM | POA: Diagnosis not present

## 2017-03-09 ENCOUNTER — Other Ambulatory Visit: Payer: Self-pay | Admitting: Cardiology

## 2017-03-09 ENCOUNTER — Encounter: Payer: Self-pay | Admitting: Nurse Practitioner

## 2017-03-14 DIAGNOSIS — I10 Essential (primary) hypertension: Secondary | ICD-10-CM | POA: Diagnosis not present

## 2017-03-14 DIAGNOSIS — E291 Testicular hypofunction: Secondary | ICD-10-CM | POA: Diagnosis not present

## 2017-03-14 DIAGNOSIS — M859 Disorder of bone density and structure, unspecified: Secondary | ICD-10-CM | POA: Diagnosis not present

## 2017-03-14 DIAGNOSIS — Z125 Encounter for screening for malignant neoplasm of prostate: Secondary | ICD-10-CM | POA: Diagnosis not present

## 2017-03-14 DIAGNOSIS — E784 Other hyperlipidemia: Secondary | ICD-10-CM | POA: Diagnosis not present

## 2017-03-14 DIAGNOSIS — N183 Chronic kidney disease, stage 3 (moderate): Secondary | ICD-10-CM | POA: Diagnosis not present

## 2017-03-16 ENCOUNTER — Telehealth: Payer: Self-pay | Admitting: *Deleted

## 2017-03-16 ENCOUNTER — Ambulatory Visit (INDEPENDENT_AMBULATORY_CARE_PROVIDER_SITE_OTHER): Payer: Medicare Other | Admitting: Nurse Practitioner

## 2017-03-16 ENCOUNTER — Encounter: Payer: Self-pay | Admitting: Nurse Practitioner

## 2017-03-16 VITALS — BP 140/68 | HR 66 | Ht 66.0 in | Wt 129.8 lb

## 2017-03-16 DIAGNOSIS — Z79899 Other long term (current) drug therapy: Secondary | ICD-10-CM | POA: Diagnosis not present

## 2017-03-16 DIAGNOSIS — I2510 Atherosclerotic heart disease of native coronary artery without angina pectoris: Secondary | ICD-10-CM | POA: Diagnosis not present

## 2017-03-16 DIAGNOSIS — I5032 Chronic diastolic (congestive) heart failure: Secondary | ICD-10-CM

## 2017-03-16 DIAGNOSIS — I480 Paroxysmal atrial fibrillation: Secondary | ICD-10-CM

## 2017-03-16 MED ORDER — ISOSORBIDE MONONITRATE ER 60 MG PO TB24: 30.0000 mg | ORAL_TABLET | Freq: Every day | ORAL | 4 refills | Status: DC

## 2017-03-16 MED ORDER — DILTIAZEM HCL ER COATED BEADS 120 MG PO CP24: 120.0000 mg | ORAL_CAPSULE | Freq: Every day | ORAL | 3 refills | Status: DC

## 2017-03-16 NOTE — Progress Notes (Signed)
CARDIOLOGY OFFICE NOTE  Date:  03/16/2017    [name] Date of Birth: February 12, 1936 Medical Record #[id number]  PCP:  Geoffery Lyons, MD  Cardiologist:  Annabell Howells    Chief Complaint  Patient presents with  . Coronary Artery Disease    Seen for [name]    History of Present Illness: [name] is a 81 y.o. male who presents today for a follow up visit. Seen for [name].  Former patient of Dr. [name].   He has known CAD with MI dating back to 1988, PCI in 1996 with subsequent past CABG in 1996 due to restenosis. Stents to the LCX in 2009. Last cath dates back to 2009. Had a SVG to the distal RCA patent and the CFX system was patent. LIMA was atretic and there were serial 60 to 80% stenoses in the native LAD. He has been managed medically. He was previously on chronic Ranexa but stopped due to risk of QT prolongation with Tikosyn.   Other issues include GERD, herpes zoster with post herpetic neuralgia, post polio syndrome, PVCs, HLD, colonic polyps and prior brainstem stroke with residual Horner syndrome in 1996 following cardiac cath.   Echo in 03/2014 showed EF 60 to 65% with mild MR and biatrial enlargement. Lexiscan Cardiolite in 11/15 showed no ischemia.   He was initially noted to have atrial fibrillation in the summer of 2014. He was started on Xarelto and cardioverted to NSR in 8/14. Recurrent atrial fibrillation was noted in 1/15, and he was cardioverted to NSR again. This time, NSR did not hold long. At last appointment in 5/15, he was in persistent atrial fibrillation.He was referred to [facility] where he had atrial fibrillation ablation and Tikosyn loading. Ranolazine was stopped due to risk of QT prolongation and Imdur was started as an anti-anginal instead.  Last seen by [name] in March of 2016 - had previously been started on Lasix. Continued to have some exertional dyspnea. Lasix was increased due to the DOE. He was in NSR. BNP not  really all that impressive.   CTA chest done in 2/16 to look for evidence for PV stenosis post-AF ablation. This showed mild short-segment narrowing of the left inferior pulmonary vein.   He has had stable DOE over the past few years. He has been happy with how he has done. Lipids checked by PCP.   He had been released by EP at [facility]. Last seen by me back in September - some generalized complaints but cardiac status was ok.   Comes back today. Here with his wife. Says he is doing ok. No chest pain. Breathing is about the same - chronically with some degree of dyspnea. He has not taken his Lasix as regular as he should - this results in weight gain and more dyspnea - he notes he was too busy with meetings so he held it but he has a good understanding of the need to take. Not dizzy or lightheaded. Still feels cold and he notes he "would like to warm up". He would like to try going on some low dose CCB for his Raynaud's. Otherwise, he is happy with how he is doing. Did have labs earlier this week with PCP.   PMH: 1. CAD: 1st MI in 64. CABG 1996. PCI to CFX in 2009. LHC (11/09) SVG-dRCA patent, total occlusion RCA, patent CFX stent, atretic LIMA, serial 60 and 80% proximal LAD stenoses, EF 60% with basal inferior hypokinesis. Myoview in  2011 with no ischemia or infarction. Echo (10/12) with EF 55-60%, mild LVH, mild MR. Lexiscan Cardiolite in 2013 with no ischemia or infarction. Echo (8/14) with EF 55-60%, moderate MR, moderate TR, PA systolic pressure 35 mmHg. Echo (4/15) with EF 60-65%, mild focal basal septal hypertrophy, inferior basal akinesis, mild MR. Lexiscan cardiolite (11/15) with EF 61%, fixed basal inferoseptal defect, no ischemia.  2. Raynauds 3. Post-polio syndrome 4. GERD with dilation of esophageal stricture in 12/12.  5. Hyperlipidemia 6. Brainstem stroke with Horner's syndrome.  7. Scoliosis. 8. H/o appendectomy 9. Herpes Zoster 10. Atrial fibrillation: DCCV to NSR  in 8/14. DCCV to NSR in 1/15. Atrial fibrillation ablation 6/15 with Tikosyn loading (at [facility]).  11. PFTs (4/15) with FVC 59%, FEV1 54%, ratio 91%, DLCO 53% =>moderate obstructive defect thought to be related to COPD and severe restrictive defect thought to be due to elevated left hemidiaphragm and post-polio syndrome.  12. Chronic diastolic CHF.    Past Medical History:  Diagnosis Date  . Abnormal nuclear cardiac imaging test March 2011   Has positive EKG response, no perfusion defect and normal EF  . Adenomatous colon polyp 12/2002  . Atrial fibrillation ([place])   . Brainstem stroke ([place]) 1996   with residual horner syndrome  . CHF (congestive heart failure) ([place])   . COPD (chronic obstructive pulmonary disease) ([place])   . Coronary artery disease    First MI in 1988. PCI in 1996 with subsequent CABG in 1996 due to restenosis. S/P stents to LCX in 2009. Noted to have residual disease in the LAD in a diffuse manner and atretic LIMA graft. He is managed medically.   . Esophageal stricture   . GERD (gastroesophageal reflux disease)   . 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 History of Raynaud's syndrome   . Hyperlipidemia   . Internal hemorrhoids   . Persistent atrial fibrillation ([place])   . Polio    age 62  . PVC (premature ventricular contraction)   . Scoliosis    mild    Past Surgical History:  Procedure Laterality Date  . APPENDECTOMY     Age 74  . CARDIOVERSION N/A 08/22/2013   Procedure: CARDIOVERSION;  Surgeon: Carlena Bjornstad, MD;  Location: [facility] ENDOSCOPY;  Service: Cardiovascular;  Laterality: N/A;  . CARDIOVERSION N/A 01/16/2014   Procedure: CARDIOVERSION;  Surgeon: Lelon Perla, MD;  Location: [facility] ENDOSCOPY;  Service: Cardiovascular;  Laterality: N/A;  . COLONOSCOPY  2008  . CORONARY ARTERY BYPASS GRAFT  1996   with a LIMA to the LAD, SVG to RCA and SVG to OM  . CORONARY STENT PLACEMENT  2009    LCX  . TONSILLECTOMY       Medications: Current Outpatient  Prescriptions  Medication Sig Dispense Refill  . acetaminophen (TYLENOL) 500 MG tablet Take 500 mg by mouth every 12 (twelve) hours.    [name] albuterol (PROAIR HFA) 108 (90 BASE) MCG/ACT inhaler Inhale 2 puffs into the lungs every 4 (four) hours as needed for wheezing or shortness of breath (and prior to exercise). 1 Inhaler 3  . AMBULATORY NON FORMULARY MEDICATION Testosterone cream 150mg /15% Place 15% onto the skin daily    . BYSTOLIC 10 MG tablet TAKE 1 TABLET BY MOUTH EVERY DAY 30 tablet 11  . cetirizine (ZYRTEC) 10 MG tablet Take 10 mg by mouth.    . dofetilide (TIKOSYN) 250 MCG capsule TAKE 1 CAPSULE BY MOUTH EVERY 12 HOURS 180 capsule 1  . furosemide (  LASIX) 40 MG tablet Take 1 tablet 40 mg total by mouth daily. 90 tablet 3  . isosorbide mononitrate (IMDUR) 60 MG 24 hr tablet Take 0.5 tablets (30 mg total) by mouth daily. 90 tablet 4  . KLOR-CON M20 20 MEQ tablet TAKE 2 TABLETS BY MOUTH DAILY ALTERNATING WITH 1 TABLET DAILY AS DIRECTED 150 tablet 3  . lactobacillus acidophilus & bulgar (LACTINEX) chewable tablet Chew 2 tablets by mouth daily.     [name] LORazepam (ATIVAN) 0.5 MG tablet Take 0.5 mg by mouth at bedtime as needed.     . magnesium oxide (MAG-OX) 400 MG tablet Take 400 mg by mouth daily.      . methocarbamol (ROBAXIN) 500 MG tablet Take 500 mg by mouth as needed for muscle spasms.    . ranitidine (ZANTAC) 300 MG tablet TAKE 1 TABLET BY MOUTH EVERY DAY 30 tablet 3  . rosuvastatin (CRESTOR) 40 MG tablet TAKE 1 TABLET BY MOUTH EVERY DAY 90 tablet 3  . traZODone (DESYREL) 50 MG tablet Take 50 mg by mouth at bedtime.    . Vitamin D, Ergocalciferol, (DRISDOL) 50000 UNITS CAPS capsule Take 50,000 Units by mouth as directed. Pt taking twice a month    . XARELTO 20 MG TABS tablet TAKE 1 TABLET BY MOUTH ONCE DAILY 30 tablet 5  . diltiazem (CARDIZEM CD) 120 MG 24 hr capsule Take 1 capsule (120 mg total) by mouth daily. 90 capsule 3   No current facility-administered medications for this visit.      Allergies: Allergies  Allergen Reactions  . Morphine And Related Other (See Comments)    IV forms- vein irritation IV forms- vein irritation    Social History: The patient  reports that he quit smoking about 50 years ago. His smoking use included Cigarettes. He has a 2.50 pack-year smoking history. He has never used smokeless tobacco. He reports that he does not drink alcohol or use drugs.   Family History: The patient's family history includes Heart Problems in his brother; Heart disease in his father, maternal grandfather, maternal grandmother, mother, paternal grandfather, and paternal grandmother; Prostate cancer in his paternal uncle.   Review of Systems: Please see the history of present illness.   Otherwise, the review of systems is positive for none.   All other systems are reviewed and negative.   Physical Exam: VS:  BP 140/68   Pulse 66   Ht 5\' 6"  (1.676 m)   Wt 129 lb 12.8 oz (58.9 kg)   BMI 20.95 kg/m  .  BMI Body mass index is 20.95 kg/m.  Wt Readings from Last 3 Encounters:  03/16/17 129 lb 12.8 oz (58.9 kg)  02/28/17 129 lb 4 oz (58.6 kg)  08/31/16 130 lb 1.9 oz (59 kg)    General: Pleasant. Well developed, well nourished and in no acute distress.   HEENT: Normal.  Neck: Supple, no JVD, carotid bruits, or masses noted.  Cardiac: Regular rate and rhythm. No murmurs, rubs, or gallops. No edema.  Respiratory:  Lungs are clear to auscultation bilaterally with normal work of breathing.  GI: Soft and nontender.  MS: No deformity or atrophy. Gait and ROM intact.  Skin: Warm and dry. Color is normal.  Neuro:  Strength and sensation are intact and no gross focal deficits noted.  Psych: Alert, appropriate and with normal affect.   LABORATORY DATA:  EKG:  EKG is ordered today. This demonstrates NSR HR is 66. QTc is 475.  Lab Results  Component Value Date  WBC 5.9 03/06/2014   HGB 14.0 03/06/2014   HCT 42.2 03/06/2014   PLT 166.0 03/06/2014   GLUCOSE 77  09/02/2015   CHOL 162 07/19/2014   TRIG 88.0 07/19/2014   HDL 48.20 07/19/2014   LDLCALC 96 07/19/2014   ALT 30 08/19/2014   AST 36 08/19/2014   NA 138 09/02/2015   K 4.4 09/02/2015   CL 102 09/02/2015   CREATININE 1.27 09/02/2015   BUN 20 09/02/2015   CO2 30 09/02/2015   TSH 1.62 03/25/2014   INR 2.6 (H) 01/15/2014    BNP (last 3 results) No results for input(s): BNP in the last 8760 hours.  ProBNP (last 3 results) No results for input(s): PROBNP in the last 8760 hours.   Other Studies Reviewed Today:  Myoview Impression from 10/2014 Exercise Capacity: Lexiscan with no exercise. BP Response: Normal blood pressure response Clinical Symptoms: Typical symptoms with Lexiscan. ECG Impression: No significant ST segment change suggestive of ischemia. Comparison with Prior Nuclear Study: No images to compare  Overall Impression: Low risk stress nuclear study with no evidence of ischemia. [name] basal inferoseptal fixed defect, possible infarct.  LV Ejection Fraction: 61%. LV Wall Motion: Septal wall hypokinesis  Candee Furbish, MD    Echo Study Conclusions from 03/2014  - Left ventricle: The cavity size was normal. There was mild focal basal hypertrophy of the septum. Systolic function was normal. The estimated ejection fraction was in the range of 60% to 65%. There is akinesis of the basalinferior myocardium. - Mitral valve: Calcified annulus. Mildly thickened leaflets . Mild regurgitation. - Left atrium: The atrium was moderately to severely dilated. - Right atrium: The atrium was mildly dilated. Impressions:  - Compared to 08/13/13, LV function remains normal; biatrial enlargement; MR now appears mild.  Assessment/Plan:  1. Atrial fibrillation: He remains in NSR - on Tikosyn.  Remains on anticoagulation.  2. CAD: He is not taking aspirin as he has stable CAD and is on Xarelto. Continue nebivolol and Crestor. Cardiolite from 10/2014 showed  no ischemia.Will try to wean off Imdur in light of starting CCB - see #6.    3. Hyperlipidemia: He is taking Crestor 40 mg daily. Lipids are followed by PCP.  4. Chronic diastolic CHF: NYHA class II symptoms - he seems stable to me. He is happy with how he is doing. No need for further testing for now.   5. Pulmonary: PFTs suggested both a restrictive and obstructive defect. The restrictive defect is likely from post-polio syndrome and elevated left hemidiaphragm. Mr Vowels never smoked much, so the obstructive defect is more surprising. He is followed by pulmonary. No change in symptoms.   6. Raynaud's - he wants to try going back on CCB therapy - he has plenty of HR and BP today to support. He wants to try and cut the Imdur back - will do half dose for a month and then stop but if he has recurrent angina - needs to go back on the full dose.   Current medicines are reviewed with the patient today.  The patient does not have concerns regarding medicines other than what has been noted above.  The following changes have been made:  See above.  Labs/ tests ordered today include:    Orders Placed This Encounter  Procedures  . EKG 12-Lead     Disposition:   FU with me in 6 months.   Patient is agreeable to this plan and will call if any problems develop in the interim.  SignedTruitt Merle, NP  03/16/2017 9:31 AM  [place] [address] [place], [place]  [address] Phone: [phone] Fax: [phone]

## 2017-03-16 NOTE — Patient Instructions (Addendum)
We will be checking the following labs today - NONE  We will call [name] to get your recent labs.    Medication Instructions:    Continue with your current medicines. BUT  Let's cut the Imdur back to 30 mg for a month - then ok to stop - if you have recurrent angina - restart  We will add Cardizem 120 mg a day.     Testing/Procedures To Be Arranged:  N/A  Follow-Up:   See me in 6 months     Other Special Instructions:   N/A    If you need a refill on your cardiac medications before your next appointment, please call your pharmacy.   Call the [place] office at [phone] if you have any questions, problems or concerns.

## 2017-03-16 NOTE — Telephone Encounter (Signed)
s/w [facility] to receive recent labs from pt to be faxed to office.

## 2017-03-21 ENCOUNTER — Other Ambulatory Visit: Payer: Self-pay | Admitting: *Deleted

## 2017-03-21 DIAGNOSIS — Z0000 Encounter for general adult medical examination without abnormal findings: Secondary | ICD-10-CM | POA: Diagnosis not present

## 2017-03-21 DIAGNOSIS — I2510 Atherosclerotic heart disease of native coronary artery without angina pectoris: Secondary | ICD-10-CM | POA: Diagnosis not present

## 2017-03-21 DIAGNOSIS — E784 Other hyperlipidemia: Secondary | ICD-10-CM | POA: Diagnosis not present

## 2017-03-21 DIAGNOSIS — E291 Testicular hypofunction: Secondary | ICD-10-CM | POA: Diagnosis not present

## 2017-03-21 DIAGNOSIS — J3089 Other allergic rhinitis: Secondary | ICD-10-CM | POA: Diagnosis not present

## 2017-03-21 DIAGNOSIS — Z23 Encounter for immunization: Secondary | ICD-10-CM | POA: Diagnosis not present

## 2017-03-21 DIAGNOSIS — M859 Disorder of bone density and structure, unspecified: Secondary | ICD-10-CM | POA: Diagnosis not present

## 2017-03-21 DIAGNOSIS — I480 Paroxysmal atrial fibrillation: Secondary | ICD-10-CM

## 2017-03-21 DIAGNOSIS — G4709 Other insomnia: Secondary | ICD-10-CM | POA: Diagnosis not present

## 2017-03-21 DIAGNOSIS — I10 Essential (primary) hypertension: Secondary | ICD-10-CM | POA: Diagnosis not present

## 2017-03-21 DIAGNOSIS — N183 Chronic kidney disease, stage 3 (moderate): Secondary | ICD-10-CM | POA: Diagnosis not present

## 2017-03-21 DIAGNOSIS — I8390 Asymptomatic varicose veins of unspecified lower extremity: Secondary | ICD-10-CM | POA: Diagnosis not present

## 2017-03-23 DIAGNOSIS — Z1212 Encounter for screening for malignant neoplasm of rectum: Secondary | ICD-10-CM | POA: Diagnosis not present

## 2017-03-30 ENCOUNTER — Other Ambulatory Visit: Payer: Self-pay | Admitting: Cardiology

## 2017-03-31 ENCOUNTER — Other Ambulatory Visit: Payer: Medicare Other | Admitting: *Deleted

## 2017-03-31 DIAGNOSIS — I480 Paroxysmal atrial fibrillation: Secondary | ICD-10-CM

## 2017-03-31 LAB — BASIC METABOLIC PANEL
BUN/Creatinine Ratio: 13 (ref 10–24)
BUN: 19 mg/dL (ref 8–27)
CO2: 24 mmol/L (ref 18–29)
Calcium: 10 mg/dL (ref 8.6–10.2)
Chloride: 99 mmol/L (ref 96–106)
Creatinine, Ser: 1.51 mg/dL — ABNORMAL HIGH (ref 0.76–1.27)
GFR calc Af Amer: 50 mL/min/{1.73_m2} — ABNORMAL LOW (ref >59)
GFR calc non Af Amer: 44 mL/min/{1.73_m2} — ABNORMAL LOW (ref >59)
Glucose: 95 mg/dL (ref 65–99)
Potassium: 4.2 mmol/L (ref 3.5–5.2)
Sodium: 139 mmol/L (ref 134–144)

## 2017-03-31 LAB — MAGNESIUM: Magnesium: 2.1 mg/dL (ref 1.6–2.3)

## 2017-04-19 ENCOUNTER — Telehealth: Payer: Self-pay | Admitting: *Deleted

## 2017-04-19 ENCOUNTER — Other Ambulatory Visit: Payer: Self-pay | Admitting: *Deleted

## 2017-04-19 DIAGNOSIS — I5032 Chronic diastolic (congestive) heart failure: Secondary | ICD-10-CM

## 2017-04-19 DIAGNOSIS — J449 Chronic obstructive pulmonary disease, unspecified: Secondary | ICD-10-CM

## 2017-04-19 NOTE — Telephone Encounter (Signed)
Pt is aware of Lori's treatment plan and agreeable to come in on may 10 for repeat labs.  Orders in and linked. Appointment made.

## 2017-05-05 ENCOUNTER — Other Ambulatory Visit: Payer: Medicare Other | Admitting: *Deleted

## 2017-05-05 DIAGNOSIS — J449 Chronic obstructive pulmonary disease, unspecified: Secondary | ICD-10-CM

## 2017-05-05 DIAGNOSIS — I5032 Chronic diastolic (congestive) heart failure: Secondary | ICD-10-CM

## 2017-05-05 LAB — BASIC METABOLIC PANEL
BUN/Creatinine Ratio: 13 (ref 10–24)
BUN: 19 mg/dL (ref 8–27)
CO2: 24 mmol/L (ref 18–29)
Calcium: 9.9 mg/dL (ref 8.6–10.2)
Chloride: 98 mmol/L (ref 96–106)
Creatinine, Ser: 1.43 mg/dL — ABNORMAL HIGH (ref 0.76–1.27)
GFR calc Af Amer: 54 mL/min/{1.73_m2} — ABNORMAL LOW (ref >59)
GFR calc non Af Amer: 47 mL/min/{1.73_m2} — ABNORMAL LOW (ref >59)
Glucose: 150 mg/dL — ABNORMAL HIGH (ref 65–99)
Potassium: 4.3 mmol/L (ref 3.5–5.2)
Sodium: 139 mmol/L (ref 134–144)

## 2017-05-05 LAB — MAGNESIUM: Magnesium: 2.1 mg/dL (ref 1.6–2.3)

## 2017-05-06 ENCOUNTER — Telehealth: Payer: Self-pay | Admitting: Nurse Practitioner

## 2017-05-06 NOTE — Telephone Encounter (Signed)
f/u message  returned call about labs. Please call back

## 2017-05-09 ENCOUNTER — Other Ambulatory Visit: Payer: Self-pay | Admitting: *Deleted

## 2017-05-09 DIAGNOSIS — I4891 Unspecified atrial fibrillation: Secondary | ICD-10-CM

## 2017-05-09 DIAGNOSIS — I2510 Atherosclerotic heart disease of native coronary artery without angina pectoris: Secondary | ICD-10-CM

## 2017-05-14 ENCOUNTER — Other Ambulatory Visit: Payer: Self-pay | Admitting: Nurse Practitioner

## 2017-05-19 ENCOUNTER — Other Ambulatory Visit: Payer: Self-pay | Admitting: Cardiology

## 2017-05-20 ENCOUNTER — Encounter (HOSPITAL_COMMUNITY): Payer: Self-pay | Admitting: *Deleted

## 2017-05-20 ENCOUNTER — Telehealth: Payer: Self-pay | Admitting: Nurse Practitioner

## 2017-05-20 ENCOUNTER — Emergency Department (HOSPITAL_COMMUNITY): Payer: Medicare Other

## 2017-05-20 ENCOUNTER — Inpatient Hospital Stay (HOSPITAL_COMMUNITY)
Admission: EM | Admit: 2017-05-20 | Discharge: 2017-05-22 | DRG: 292 | Disposition: A | Discharge status: Home / Self Care | Payer: Medicare Other | Attending: Cardiology | Admitting: Cardiology

## 2017-05-20 DIAGNOSIS — Z8612 Personal history of poliomyelitis: Secondary | ICD-10-CM | POA: Diagnosis present

## 2017-05-20 DIAGNOSIS — J449 Chronic obstructive pulmonary disease, unspecified: Secondary | ICD-10-CM | POA: Diagnosis present

## 2017-05-20 DIAGNOSIS — R0602 Shortness of breath: Secondary | ICD-10-CM | POA: Diagnosis not present

## 2017-05-20 DIAGNOSIS — Z955 Presence of coronary angioplasty implant and graft: Secondary | ICD-10-CM

## 2017-05-20 DIAGNOSIS — M419 Scoliosis, unspecified: Secondary | ICD-10-CM | POA: Diagnosis present

## 2017-05-20 DIAGNOSIS — I4891 Unspecified atrial fibrillation: Secondary | ICD-10-CM | POA: Diagnosis present

## 2017-05-20 DIAGNOSIS — I7300 Raynaud's syndrome without gangrene: Secondary | ICD-10-CM | POA: Diagnosis present

## 2017-05-20 DIAGNOSIS — N184 Chronic kidney disease, stage 4 (severe): Secondary | ICD-10-CM | POA: Diagnosis not present

## 2017-05-20 DIAGNOSIS — G14 Postpolio syndrome: Secondary | ICD-10-CM | POA: Diagnosis present

## 2017-05-20 DIAGNOSIS — K219 Gastro-esophageal reflux disease without esophagitis: Secondary | ICD-10-CM | POA: Diagnosis present

## 2017-05-20 DIAGNOSIS — I509 Heart failure, unspecified: Secondary | ICD-10-CM | POA: Diagnosis not present

## 2017-05-20 DIAGNOSIS — I5033 Acute on chronic diastolic (congestive) heart failure: Secondary | ICD-10-CM | POA: Diagnosis not present

## 2017-05-20 DIAGNOSIS — R7989 Other specified abnormal findings of blood chemistry: Secondary | ICD-10-CM | POA: Diagnosis present

## 2017-05-20 DIAGNOSIS — Z7901 Long term (current) use of anticoagulants: Secondary | ICD-10-CM

## 2017-05-20 DIAGNOSIS — I252 Old myocardial infarction: Secondary | ICD-10-CM

## 2017-05-20 DIAGNOSIS — I480 Paroxysmal atrial fibrillation: Secondary | ICD-10-CM | POA: Diagnosis present

## 2017-05-20 DIAGNOSIS — Z8679 Personal history of other diseases of the circulatory system: Secondary | ICD-10-CM | POA: Diagnosis not present

## 2017-05-20 DIAGNOSIS — R0902 Hypoxemia: Secondary | ICD-10-CM | POA: Diagnosis present

## 2017-05-20 DIAGNOSIS — N179 Acute kidney failure, unspecified: Secondary | ICD-10-CM | POA: Diagnosis not present

## 2017-05-20 DIAGNOSIS — Z79899 Other long term (current) drug therapy: Secondary | ICD-10-CM

## 2017-05-20 DIAGNOSIS — J42 Unspecified chronic bronchitis: Secondary | ICD-10-CM | POA: Diagnosis not present

## 2017-05-20 DIAGNOSIS — E785 Hyperlipidemia, unspecified: Secondary | ICD-10-CM | POA: Diagnosis present

## 2017-05-20 DIAGNOSIS — I481 Persistent atrial fibrillation: Secondary | ICD-10-CM | POA: Diagnosis present

## 2017-05-20 DIAGNOSIS — B0229 Other postherpetic nervous system involvement: Secondary | ICD-10-CM | POA: Diagnosis not present

## 2017-05-20 DIAGNOSIS — Z951 Presence of aortocoronary bypass graft: Secondary | ICD-10-CM

## 2017-05-20 DIAGNOSIS — I2510 Atherosclerotic heart disease of native coronary artery without angina pectoris: Secondary | ICD-10-CM | POA: Diagnosis present

## 2017-05-20 DIAGNOSIS — I5032 Chronic diastolic (congestive) heart failure: Secondary | ICD-10-CM | POA: Insufficient documentation

## 2017-05-20 DIAGNOSIS — Z885 Allergy status to narcotic agent status: Secondary | ICD-10-CM

## 2017-05-20 DIAGNOSIS — Z8719 Personal history of other diseases of the digestive system: Secondary | ICD-10-CM

## 2017-05-20 DIAGNOSIS — Z87891 Personal history of nicotine dependence: Secondary | ICD-10-CM

## 2017-05-20 DIAGNOSIS — G902 Horner's syndrome: Secondary | ICD-10-CM | POA: Diagnosis present

## 2017-05-20 DIAGNOSIS — R05 Cough: Secondary | ICD-10-CM | POA: Diagnosis not present

## 2017-05-20 DIAGNOSIS — Z8673 Personal history of transient ischemic attack (TIA), and cerebral infarction without residual deficits: Secondary | ICD-10-CM

## 2017-05-20 DIAGNOSIS — J986 Disorders of diaphragm: Secondary | ICD-10-CM | POA: Diagnosis present

## 2017-05-20 HISTORY — DX: Chronic diastolic (congestive) heart failure: I50.32

## 2017-05-20 LAB — BASIC METABOLIC PANEL
Anion gap: 9 (ref 5–15)
BUN: 18 mg/dL (ref 6–20)
CALCIUM: 9.8 mg/dL (ref 8.9–10.3)
CO2: 26 mmol/L (ref 22–32)
Chloride: 101 mmol/L (ref 101–111)
Creatinine, Ser: 1.7 mg/dL — ABNORMAL HIGH (ref 0.61–1.24)
GFR, EST AFRICAN AMERICAN: 43 mL/min — AB (ref >60)
GFR, EST NON AFRICAN AMERICAN: 37 mL/min — AB (ref >60)
Glucose, Bld: 132 mg/dL — ABNORMAL HIGH (ref 65–99)
Potassium: 3.9 mmol/L (ref 3.5–5.1)
SODIUM: 136 mmol/L (ref 135–145)

## 2017-05-20 LAB — I-STAT TROPONIN, ED: TROPONIN I, POC: 0.01 ng/mL (ref 0.00–0.08)

## 2017-05-20 LAB — CBC
HCT: 44.8 % (ref 39.0–52.0)
Hemoglobin: 14.8 g/dL (ref 13.0–17.0)
MCH: 32.7 pg (ref 26.0–34.0)
MCHC: 33 g/dL (ref 30.0–36.0)
MCV: 98.9 fL (ref 78.0–100.0)
PLATELETS: 168 10*3/uL (ref 150–400)
RBC: 4.53 MIL/uL (ref 4.22–5.81)
RDW: 13.8 % (ref 11.5–15.5)
WBC: 7.8 10*3/uL (ref 4.0–10.5)

## 2017-05-20 LAB — BRAIN NATRIURETIC PEPTIDE: B NATRIURETIC PEPTIDE 5: 305.1 pg/mL — AB (ref 0.0–100.0)

## 2017-05-20 MED ORDER — FAMOTIDINE 20 MG PO TABS
20.0000 mg | ORAL_TABLET | Freq: Every day | ORAL | Status: DC
  Administered 2017-05-20 – 2017-05-22 (×3): 20 mg via ORAL
  Filled 2017-05-20 (×3): qty 1

## 2017-05-20 MED ORDER — SODIUM CHLORIDE 0.9% FLUSH
3.0000 mL | Freq: Two times a day (BID) | INTRAVENOUS | Status: DC
  Administered 2017-05-20 – 2017-05-22 (×4): 3 mL via INTRAVENOUS

## 2017-05-20 MED ORDER — ZOLPIDEM TARTRATE 5 MG PO TABS
5.0000 mg | ORAL_TABLET | Freq: Every evening | ORAL | Status: DC | PRN
  Administered 2017-05-20 – 2017-05-21 (×2): 5 mg via ORAL
  Filled 2017-05-20 (×2): qty 1

## 2017-05-20 MED ORDER — FUROSEMIDE 10 MG/ML IJ SOLN
80.0000 mg | Freq: Once | INTRAMUSCULAR | Status: AC
  Administered 2017-05-20: 80 mg via INTRAVENOUS
  Filled 2017-05-20: qty 8

## 2017-05-20 MED ORDER — PREDNISONE 20 MG PO TABS
40.0000 mg | ORAL_TABLET | Freq: Every day | ORAL | Status: DC
  Administered 2017-05-20 – 2017-05-22 (×3): 40 mg via ORAL
  Filled 2017-05-20 (×3): qty 2

## 2017-05-20 MED ORDER — ACETAMINOPHEN 325 MG PO TABS: 650.0000 mg | ORAL_TABLET | ORAL | Status: DC | PRN

## 2017-05-20 MED ORDER — ROSUVASTATIN CALCIUM 40 MG PO TABS
40.0000 mg | ORAL_TABLET | Freq: Every evening | ORAL | Status: DC
Start: 2017-05-20 — End: 2017-05-22
  Administered 2017-05-20 – 2017-05-21 (×2): 40 mg via ORAL
  Filled 2017-05-20 (×2): qty 1

## 2017-05-20 MED ORDER — IPRATROPIUM-ALBUTEROL 0.5-2.5 (3) MG/3ML IN SOLN
3.0000 mL | RESPIRATORY_TRACT | Status: DC
Start: 2017-05-20 — End: 2017-05-20
  Administered 2017-05-20: 3 mL via RESPIRATORY_TRACT
  Filled 2017-05-20: qty 3

## 2017-05-20 MED ORDER — LORATADINE 10 MG PO TABS
10.0000 mg | ORAL_TABLET | Freq: Every day | ORAL | Status: DC
  Administered 2017-05-21 – 2017-05-22 (×2): 10 mg via ORAL
  Filled 2017-05-20 (×2): qty 1

## 2017-05-20 MED ORDER — NITROGLYCERIN 0.4 MG SL SUBL: 0.4000 mg | SUBLINGUAL_TABLET | SUBLINGUAL | Status: DC | PRN

## 2017-05-20 MED ORDER — FUROSEMIDE 10 MG/ML IJ SOLN: 40.0000 mg | Freq: Two times a day (BID) | INTRAMUSCULAR | Status: DC

## 2017-05-20 MED ORDER — RIVAROXABAN 15 MG PO TABS
15.0000 mg | ORAL_TABLET | Freq: Every evening | ORAL | Status: DC
  Administered 2017-05-20 – 2017-05-21 (×2): 15 mg via ORAL
  Filled 2017-05-20 (×2): qty 1

## 2017-05-20 MED ORDER — NEBIVOLOL HCL 10 MG PO TABS
10.0000 mg | ORAL_TABLET | Freq: Every day | ORAL | Status: DC
  Administered 2017-05-21 – 2017-05-22 (×2): 10 mg via ORAL
  Filled 2017-05-20 (×2): qty 1

## 2017-05-20 MED ORDER — POTASSIUM CHLORIDE CRYS ER 20 MEQ PO TBCR
20.0000 meq | EXTENDED_RELEASE_TABLET | Freq: Two times a day (BID) | ORAL | Status: DC
  Administered 2017-05-20 – 2017-05-22 (×4): 20 meq via ORAL
  Filled 2017-05-20 (×4): qty 1

## 2017-05-20 MED ORDER — GUAIFENESIN ER 600 MG PO TB12
1200.0000 mg | ORAL_TABLET | Freq: Two times a day (BID) | ORAL | Status: DC
  Administered 2017-05-20 – 2017-05-22 (×4): 1200 mg via ORAL
  Filled 2017-05-20 (×4): qty 2

## 2017-05-20 MED ORDER — LACTINEX PO CHEW
2.0000 | CHEWABLE_TABLET | Freq: Every day | ORAL | Status: DC
  Administered 2017-05-21 – 2017-05-22 (×2): 2 via ORAL
  Filled 2017-05-20 (×3): qty 2

## 2017-05-20 MED ORDER — DOFETILIDE 125 MCG PO CAPS
125.0000 ug | ORAL_CAPSULE | Freq: Two times a day (BID) | ORAL | Status: DC
  Administered 2017-05-20 – 2017-05-22 (×4): 125 ug via ORAL
  Filled 2017-05-20 (×4): qty 1

## 2017-05-20 MED ORDER — TRAZODONE HCL 50 MG PO TABS
50.0000 mg | ORAL_TABLET | Freq: Every day | ORAL | Status: DC
  Administered 2017-05-20 – 2017-05-21 (×2): 50 mg via ORAL
  Filled 2017-05-20 (×2): qty 1

## 2017-05-20 MED ORDER — ONDANSETRON HCL 4 MG/2ML IJ SOLN: 4.0000 mg | Freq: Four times a day (QID) | INTRAMUSCULAR | Status: DC | PRN

## 2017-05-20 MED ORDER — DILTIAZEM HCL ER COATED BEADS 120 MG PO CP24
120.0000 mg | ORAL_CAPSULE | Freq: Every day | ORAL | Status: DC
  Administered 2017-05-21 – 2017-05-22 (×2): 120 mg via ORAL
  Filled 2017-05-20 (×2): qty 1

## 2017-05-20 MED ORDER — VITAMIN D (ERGOCALCIFEROL) 1.25 MG (50000 UNIT) PO CAPS: 50000.0000 [IU] | ORAL_CAPSULE | ORAL | Status: DC

## 2017-05-20 MED ORDER — LORAZEPAM 1 MG PO TABS: 0.5000 mg | ORAL_TABLET | Freq: Every evening | ORAL | Status: DC | PRN

## 2017-05-20 MED ORDER — ENOXAPARIN SODIUM 30 MG/0.3ML ~~LOC~~ SOLN: 30.0000 mg | SUBCUTANEOUS | Status: DC

## 2017-05-20 MED ORDER — FUROSEMIDE 10 MG/ML IJ SOLN
40.0000 mg | Freq: Two times a day (BID) | INTRAMUSCULAR | Status: DC
  Administered 2017-05-20: 40 mg via INTRAVENOUS
  Filled 2017-05-20 (×2): qty 4

## 2017-05-20 MED ORDER — SODIUM CHLORIDE 0.9 % IV SOLN: 250.0000 mL | INTRAVENOUS | Status: DC | PRN

## 2017-05-20 MED ORDER — SODIUM CHLORIDE 0.9% FLUSH: 3.0000 mL | INTRAVENOUS | Status: DC | PRN

## 2017-05-20 MED ORDER — IPRATROPIUM-ALBUTEROL 0.5-2.5 (3) MG/3ML IN SOLN
3.0000 mL | Freq: Two times a day (BID) | RESPIRATORY_TRACT | Status: DC
  Administered 2017-05-21 (×2): 3 mL via RESPIRATORY_TRACT
  Filled 2017-05-20 (×2): qty 3

## 2017-05-20 MED ORDER — METHOCARBAMOL 500 MG PO TABS: 500.0000 mg | ORAL_TABLET | Freq: Four times a day (QID) | ORAL | Status: DC | PRN

## 2017-05-20 MED ORDER — ALPRAZOLAM 0.25 MG PO TABS: 0.2500 mg | ORAL_TABLET | Freq: Two times a day (BID) | ORAL | Status: DC | PRN

## 2017-05-20 NOTE — Progress Notes (Signed)
PHARMACIST - PHYSICIAN COMMUNICATION  CONCERNING:  Tikosyn / Xarelto   RECOMMENDATION: Have adjusted Tikosyn to 125 mcg po BID Xarelto has been decreased to 15 mg po daily  DESCRIPTION: With AKI or CKD, with CrCl of 25 to 30 ml / min  Thank you Anette Guarneri, Pharmacy

## 2017-05-20 NOTE — ED Notes (Signed)
Cards at bedside

## 2017-05-20 NOTE — ED Notes (Signed)
Patient transported to X-ray 

## 2017-05-20 NOTE — Telephone Encounter (Signed)
New Message:  Pt is having problems breathing,his oxygen level is in the low 80's

## 2017-05-20 NOTE — Telephone Encounter (Signed)
S/w Cecille Rubin than called pt's wife per ([facility]). Cecille Rubin stated to go to ER since sats were still in the 80's and pt was having problems breathing.  Stated if pt could go to urgent care stated that would be fine but needs to go soon and if doctor sends to ER will have to go.  Stated appreciation.

## 2017-05-20 NOTE — ED Provider Notes (Signed)
[facility] DEPT Provider Note   CSN: [id number] Arrival date & time: 05/20/17  1053     History   Chief Complaint Chief Complaint  Patient presents with  . Shortness of Breath  . Cough    HPI [name] is a 81 y.o. male.  HPI 81 year old Caucasian male past medical history significant for CAD status post PCI and CABG, A. fib currently on Xarelto, CHF (last EF 60-65% and 2015), COPD presents to the emergency Department today with complaints of increasing progressively worsening shortness of breath and cough for the past 2 days. Patient states that over the past 2 days he's had increased shortness of breath on exertion. Patient also notes a nonproductive cough. States that last night he took his oxygen saturation at home was noted to be 85%. However, this morning was 95% with mild increase work of breathing. Patient also notes a 3 pound increase in weight over the past 2 days. Spoke with his cardiologist office today who instructed him to come to the ED for evaluation. Patient also states he is on Lasix for congestive heart failure and has taken increased amount of Lasix for the past 2 days to help with his increase in fluid. He denies any lower extremity edema. Denies any chest pain. Denies any fever, chills, headache, vision changes, lightheadedness, dizziness, abdominal pain, nausea, emesis, urinary symptoms, change in bowel habits. Past Medical History:  Diagnosis Date  . Abnormal nuclear cardiac imaging test March 2011   Has positive EKG response, no perfusion defect and normal EF  . Adenomatous colon polyp 12/2002  . Atrial fibrillation ([place])   . 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 CHF (congestive heart failure) ([place])   . COPD (chronic obstructive pulmonary disease) ([place])   . Coronary artery disease    First MI in 1988. PCI in 1996 with subsequent CABG in 1996 due to restenosis. S/P stents to LCX in 2009. Noted to have residual disease in the LAD  in a diffuse manner and atretic LIMA graft. He is managed medically.   . Esophageal stricture   . GERD (gastroesophageal reflux disease)   . 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 History of Raynaud's syndrome   . Hyperlipidemia   . Internal hemorrhoids   . Persistent atrial fibrillation ([place])   . Polio    age 89  . PVC (premature ventricular contraction)   . Scoliosis    mild    Patient Active Problem List   Diagnosis Date Noted  . Allergic rhinitis 11/17/2015  . Chronic diastolic CHF (congestive heart failure) ([place]) 01/28/2015  . Chest pain on exertion 11/14/2014  . Cerebral artery occlusion 06/24/2014  . Acid reflux 06/24/2014  . H/O cardiovascular disorder 06/24/2014  . H/O acute poliomyelitis 06/24/2014  . Extrasystole 06/24/2014  . A-fib ([place]) 06/24/2014  . Cerebral artery occlusion with cerebral infarction ([place]) 06/24/2014  . COPD (chronic obstructive pulmonary disease) ([place]) 05/02/2014  . CAFL (chronic airflow limitation) ([place]) 05/02/2014  . Atrial fibrillation ([place]) 11/11/2013  . CAD (coronary artery disease) 09/01/2011  . Hyperlipidemia 09/01/2011  . Atherosclerosis of coronary artery 09/01/2011  . History of Raynaud's syndrome   . History of post-polio syndrome   . GERD (gastroesophageal reflux disease)   . PVC (premature ventricular contraction)   . Brainstem stroke ([place])   . Scoliosis     Past Surgical History:  Procedure Laterality Date  . APPENDECTOMY     Age 75  .  CARDIOVERSION N/A 08/22/2013   Procedure: CARDIOVERSION;  Surgeon: Carlena Bjornstad, MD;  Location: [facility] ENDOSCOPY;  Service: Cardiovascular;  Laterality: N/A;  . CARDIOVERSION N/A 01/16/2014   Procedure: CARDIOVERSION;  Surgeon: Lelon Perla, MD;  Location: [facility] ENDOSCOPY;  Service: Cardiovascular;  Laterality: N/A;  . COLONOSCOPY  2008  . CORONARY ARTERY BYPASS GRAFT  1996   with a LIMA to the LAD, SVG to RCA and SVG to OM  . CORONARY STENT PLACEMENT  2009    LCX  . TONSILLECTOMY           Home Medications    Prior to Admission medications   Medication Sig Start Date End Date Taking? Authorizing Provider  acetaminophen (TYLENOL) 500 MG tablet Take 500 mg by mouth every 12 (twelve) hours.    [provider]  albuterol (PROAIR HFA) 108 (90 BASE) MCG/ACT inhaler Inhale 2 puffs into the lungs every 4 (four) hours as needed for wheezing or shortness of breath (and prior to exercise). 06/13/14   Collene Gobble, MD  AMBULATORY NON FORMULARY MEDICATION Testosterone cream 150mg /15% Place 15% onto the skin daily    [provider]  BYSTOLIC 10 MG tablet TAKE 1 TABLET BY MOUTH EVERY DAY 03/30/17   Burtis Junes, NP  cetirizine (ZYRTEC) 10 MG tablet Take 10 mg by mouth.    [provider]  diltiazem (CARDIZEM CD) 120 MG 24 hr capsule Take 1 capsule (120 mg total) by mouth daily. 03/16/17 06/14/17  Burtis Junes, NP  dofetilide (TIKOSYN) 250 MCG capsule TAKE 1 CAPSULE BY MOUTH EVERY 12 HOURS 03/10/17   Burtis Junes, NP  furosemide (LASIX) 40 MG tablet Take 1 tablet 40 mg total by mouth daily. 09/22/16   Larey Dresser, MD  isosorbide mononitrate (IMDUR) 60 MG 24 hr tablet Take 0.5 tablets (30 mg total) by mouth daily. 03/16/17   Burtis Junes, NP  KLOR-CON M20 20 MEQ tablet TAKE 2 TABLETS BY MOUTH DAILY ALTERNATING WITH 1 TABLET DAILY AS DIRECTED 09/14/16   Burtis Junes, NP  lactobacillus acidophilus & bulgar (LACTINEX) chewable tablet Chew 2 tablets by mouth daily.     [provider]  LORazepam (ATIVAN) 0.5 MG tablet Take 0.5 mg by mouth at bedtime as needed.     [provider]  methocarbamol (ROBAXIN) 500 MG tablet Take 500 mg by mouth as needed for muscle spasms.    [provider]  ranitidine (ZANTAC) 300 MG tablet TAKE 1 TABLET BY MOUTH EVERY DAY 02/25/15   Larey Dresser, MD  rosuvastatin (CRESTOR) 40 MG tablet TAKE 1 TABLET BY MOUTH EVERY DAY 06/21/16   Larey Dresser, MD  traZODone (DESYREL) 50 MG tablet  Take 50 mg by mouth at bedtime.    [provider]  Vitamin D, Ergocalciferol, (DRISDOL) 50000 UNITS CAPS capsule Take 50,000 Units by mouth as directed. Pt taking twice a month    [provider]  XARELTO 20 MG TABS tablet TAKE 1 TABLET BY MOUTH ONCE DAILY 03/10/17   Burtis Junes, NP    Family History Family History  Problem Relation Age of Onset  . Heart disease Mother   . Heart disease Father   . Heart Problems Brother        heart transplant  . Heart disease Maternal Grandmother   . Heart disease Maternal Grandfather   . Heart disease Paternal Grandmother   . Heart disease Paternal Grandfather   . Prostate cancer Paternal Uncle   .  Colon cancer Neg Hx     Social History Social History  Substance Use Topics  . Smoking status: Former Smoker    Packs/day: 0.50    Years: 5.00    Types: Cigarettes    Quit date: 12/27/1966  . Smokeless tobacco: Never Used  . Alcohol use No     Allergies   Morphine and related   Review of Systems Review of Systems  Constitutional: Negative for chills and fever.  HENT: Negative for congestion.   Eyes: Negative for visual disturbance.  Respiratory: Positive for cough, shortness of breath and wheezing.   Cardiovascular: Negative for chest pain.  Gastrointestinal: Negative for abdominal pain, diarrhea, nausea and vomiting.  Genitourinary: Negative for dysuria, flank pain, frequency, hematuria and urgency.  Musculoskeletal: Negative for back pain.  Skin: Negative for color change.  Neurological: Negative for dizziness, syncope, weakness, light-headedness, numbness and headaches.     Physical Exam Updated Vital Signs BP 139/73 (BP Location: Right Arm)   Pulse 67   Temp 98 F (36.7 C) (Oral)   Resp 18   Ht 5\' 6"  (1.676 m)   Wt 58.1 kg (128 lb)   SpO2 96%   BMI 20.66 kg/m   Physical Exam  Constitutional: He is oriented to person, place, and time. He appears well-developed and well-nourished. No distress.   Patient speaking complete sentences.  HENT:  Head: Normocephalic and atraumatic.  Mouth/Throat: Oropharynx is clear and moist.  Eyes: Conjunctivae are normal. Right eye exhibits no discharge. Left eye exhibits no discharge. No scleral icterus.  Neck: Normal range of motion. Neck supple. No JVD present. No tracheal deviation present. No thyromegaly present.  Cardiovascular: Normal rate, regular rhythm, normal heart sounds and intact distal pulses.  Exam reveals no gallop and no friction rub.   No murmur heard. Pulmonary/Chest: Effort normal and breath sounds normal. No respiratory distress. He has no wheezes.  Fang crackles noted in the base of the lungs bilaterally. No rhonchi noted. No wheezing noted. No increased work of breathing. Patient satting at 96% on room air.  Abdominal: Soft. Bowel sounds are normal. He exhibits no distension. There is no tenderness. There is no rebound and no guarding.  Musculoskeletal: Normal range of motion.  No lower extremity edema or calf tenderness.  Lymphadenopathy:    He has no cervical adenopathy.  Neurological: He is alert and oriented to person, place, and time.  Skin: Skin is warm and dry. Capillary refill takes less than 2 seconds.  Nursing note and vitals reviewed.    ED Treatments / Results  Labs (all labs ordered are listed, but only abnormal results are displayed) Labs Reviewed  BASIC METABOLIC PANEL - Abnormal; Notable for the following:       Result Value   Glucose, Bld 132 (*)    Creatinine, Ser 1.70 (*)    GFR calc non Af Amer 37 (*)    GFR calc Af Amer 43 (*)    All other components within normal limits  BRAIN NATRIURETIC PEPTIDE - Abnormal; Notable for the following:    B Natriuretic Peptide 305.1 (*)    All other components within normal limits  CBC  I-STAT TROPOININ, ED    EKG  EKG Interpretation None       Radiology Dg Chest 2 View  Result Date: 05/20/2017 CLINICAL DATA:  Increasing shortness of breath for 2-3  days, cough for 3-4 days, occasional dizziness, dull headaches, low oxygen saturation of 85% today, history COPD, CHF, coronary artery disease post  CABG, former smoker, atrial fibrillation, GERD EXAM: CHEST  2 VIEW COMPARISON:  08/13/2013; correlation interval CT chest 02/14/2015 FINDINGS: Normal heart size post CABG and coronary PTCA. Mediastinal contours normal. Atherosclerotic calcification aorta. Mild pulmonary vascular congestion. Interstitial infiltrates likely representing pulmonary edema and mild CHF. Bibasilar effusions and atelectasis. Chronic elevation of LEFT diaphragm. No pneumothorax. Bones demineralized. IMPRESSION: Mild CHF with small bibasilar pleural effusions. Aortic atherosclerosis. Electronically Signed   By: Lavonia Dana M.D.   On: 05/20/2017 12:34    Procedures Procedures (including critical care time)  Medications Ordered in ED Medications  nebivolol (BYSTOLIC) tablet 10 mg (not administered)  loratadine (CLARITIN) tablet 10 mg (not administered)  diltiazem (CARDIZEM CD) 24 hr capsule 120 mg (not administered)  dofetilide (TIKOSYN) capsule 250 mcg (not administered)  potassium chloride SA (K-DUR,KLOR-CON) CR tablet 20 mEq (not administered)  lactobacillus acidophilus & bulgar (LACTINEX) chewable tablet 2 tablet (not administered)  LORazepam (ATIVAN) tablet 0.5 mg (not administered)  methocarbamol (ROBAXIN) tablet 500 mg (not administered)  famotidine (PEPCID) tablet 20 mg (not administered)  rivaroxaban (XARELTO) tablet 20 mg (not administered)  rosuvastatin (CRESTOR) tablet 40 mg (not administered)  traZODone (DESYREL) tablet 50 mg (not administered)  Vitamin D (Ergocalciferol) (DRISDOL) capsule 50,000 Units (not administered)  furosemide (LASIX) injection 40 mg (not administered)  furosemide (LASIX) injection 80 mg (80 mg Intravenous Given 05/20/17 1312)     Initial Impression / Assessment and Plan / ED Course  I have reviewed the triage vital signs and the nursing  notes.  Pertinent labs & imaging results that were available during my care of the patient were reviewed by me and considered in my medical decision making (see chart for details).     The patient presents to the emergency department today with complaints of increased shortness of breath, nonproductive cough, low oxygen saturations at home with increased work of breathing. On exam patient has minimal increased work of breathing. He is satting at 96% on room air. There are faint crackles noted in bilateral basilar lobes. X-ray shows mild CHF with mild pulmonary congestion.BNP mildly elevated at 305. Likely mild CHF exacerbation. Patient given dose of IV Lasix in the ED. With good urine output. The patient could have a degree of underlying COPD. Has been using his albuterol inhaler at home. No wheezes noted on exam.EKG shows no ischemic changes. First set of troponin has been negative. Spoke wit Rosaria Ferries PA-C with cardiology who came to the ED to evaluate patient. Cardiology saw the patient and feels that admission is warranted. I will place admission orders. Patient is currently hemodynamically stable in no acute distress.   Pt seen and evaluated by Dr. Tamera Punt who is agreeable to the above plan.  Final C linical Impressions(s) / ED Diagnoses   Final diagnoses:  Acute on chronic congestive heart failure, unspecified heart failure type [facility]    New Prescriptions New Prescriptions   No medications on file     Aaron Edelman 05/20/17 1644    Malvin Johns, MD 05/20/17 1711

## 2017-05-20 NOTE — H&P (Signed)
CARDIOLOGY HISTORY AND PHYSICAL   Patient ID: [name] MRN: [id number] DOB/AGE: 1936/05/14 81 y.o.  Admit date: 05/20/2017  Primary Physician   Jimmy Bunting, MD Primary Cardiologist   Dr [name] 03/04/2015, Truitt Merle, NP 03/16/2017 Reason for Admission   CHF  HPI: [name] is a 81 y.o. 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 At that weight he can walk about a block. His DOE is worsened by going up hill or carrying a burden. He still works every day and as long as he does not try get more strenuous, does very well.   He has been noticing a decrease in his respiratory status since he came back from the beach. He has been feeling worse for about a week. His hands have been puffy and he has been noticing increasing DOE and then developed SOB at rest. The head of the bed is always elevated, for GERD, so PND is difficult to assess. He denies PND. He has not (ever) had LE edema.  The SOB got to the point that he could not really do anything. His O2 sats were 83% this am, and his family would not let him go to work. He has had a wheezy cough. He has had low-grade temps this week also. Cough is productive of clear foamy sputum. No other sx of URI.   2 days ago, he took an extra 1/2 tab Lasix. Other than that, his meds have been as prescribed.  He sees Dr Lamonte Sakai periodically for COPD, has not seen him recently. He has been using the albuterol MDI bid this week. Before then, he was using it prn, and generally < once a day.   Past Medical History:  Diagnosis Date  . Abnormal nuclear cardiac imaging test March 2011   Has positive EKG response, no perfusion  defect and normal EF  . Adenomatous colon polyp 12/2002  . Atrial fibrillation ([place])   . Brainstem stroke ([place]) 1996   with residual horner syndrome  . CHF (congestive heart failure) ([place])   . COPD (chronic obstructive pulmonary disease) ([place])   . Coronary artery disease    First MI in 1988. PCI in 1996 with subsequent CABG in 1996 due to restenosis. S/P stents to LCX in 2009. Noted to have residual disease in the LAD in a diffuse manner and atretic LIMA graft. He is managed medically.   . Esophageal stricture   . GERD (gastroesophageal reflux disease)   . 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 History of Raynaud's syndrome   . Hyperlipidemia   . Internal hemorrhoids   . Persistent atrial fibrillation ([place])   . Polio    age 38  . PVC (premature ventricular contraction)   . Scoliosis    mild     Past Surgical History:  Procedure Laterality Date  . APPENDECTOMY     Age 64  . CARDIOVERSION N/A 08/22/2013   Procedure: CARDIOVERSION;  Surgeon: Carlena Bjornstad, MD;  Location: [facility] ENDOSCOPY;  Service: Cardiovascular;  Laterality: N/A;  . CARDIOVERSION N/A 01/16/2014   Procedure: CARDIOVERSION;  Surgeon: Lelon Perla, MD;  Location: [place];  Service: Cardiovascular;  Laterality: N/A;  . COLONOSCOPY  2008  . CORONARY ARTERY BYPASS GRAFT  1996   with a LIMA to the LAD, SVG to RCA and SVG to OM  . CORONARY STENT PLACEMENT  2009    LCX  . TONSILLECTOMY      Allergies  Allergen Reactions  . Morphine And Related Other (See Comments)    IV forms- vein irritation IV forms- vein irritation    I have reviewed the patient's current medications Medication Sig  acetaminophen (TYLENOL) 500 MG tablet Take 500 mg by mouth every 12 (twelve) hours.  albuterol (PROAIR HFA) 108 (90 BASE) MCG/ACT inhaler Inhale 2 puffs into the lungs every 4 (four) hours as needed for wheezing or shortness of breath (and prior to exercise).  AMBULATORY NON FORMULARY MEDICATION Testosterone cream  150mg /15% Place 15% onto the skin daily  BYSTOLIC 10 MG tablet TAKE 1 TABLET BY MOUTH EVERY DAY  cetirizine (ZYRTEC) 10 MG tablet Take 10 mg by mouth.  diltiazem (CARDIZEM CD) 120 MG 24 hr capsule Take 1 capsule (120 mg total) by mouth daily.  dofetilide (TIKOSYN) 250 MCG capsule TAKE 1 CAPSULE BY MOUTH EVERY 12 HOURS  furosemide (LASIX) 40 MG tablet Take 1 tablet 40 mg total by mouth daily.  isosorbide mononitrate (IMDUR) 60 MG 24 hr tablet Take 0.5 tablets (30 mg total) by mouth daily.  KLOR-CON M20 20 MEQ tablet TAKE 2 TABLETS BY MOUTH DAILY ALTERNATING WITH 1 TABLET DAILY AS DIRECTED  lactobacillus acidophilus & bulgar (LACTINEX) chewable tablet Chew 2 tablets by mouth daily.   LORazepam (ATIVAN) 0.5 MG tablet Take 0.5 mg by mouth at bedtime as needed.   methocarbamol (ROBAXIN) 500 MG tablet Take 500 mg by mouth as needed for muscle spasms.  ranitidine (ZANTAC) 300 MG tablet TAKE 1 TABLET BY MOUTH EVERY DAY  rosuvastatin (CRESTOR) 40 MG tablet TAKE 1 TABLET BY MOUTH EVERY DAY  traZODone (DESYREL) 50 MG tablet Take 50 mg by mouth at bedtime.  Vitamin D, Ergocalciferol, (DRISDOL) 50000 UNITS CAPS capsule Take 50,000 Units by mouth as directed. Pt taking twice a month  XARELTO 20 MG TABS tablet TAKE 1 TABLET BY MOUTH ONCE DAILY     Social History   Social History  . Marital status: Married    Spouse name: N/A  . Number of children: 2  . Years of education: N/A   Occupational History  . Pharmacist    Social History Main Topics  . Smoking status: Former Smoker    Packs/day: 0.50    Years: 5.00    Types: Cigarettes    Quit date: 12/27/1966  . Smokeless tobacco: Never Used  . Alcohol use No  . Drug use: No  . Sexual activity: Yes   Other Topics Concern  . 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 Mother Deceased  . Father Deceased  . Brother (Not  Specified)  . MGM Deceased  . MGF Deceased  . PGM Deceased  . PGF Deceased  . Annamarie Major (Not Specified)  . Neg Hx (Not Specified)   Family History  Problem Relation Age of Onset  . Heart disease Mother   . Heart disease Father   . Heart Problems Brother        heart transplant  . Heart disease Maternal Grandmother   .  Heart disease Maternal Grandfather   . Heart disease Paternal Grandmother   . Heart disease Paternal Grandfather   . Prostate cancer Paternal Uncle   . Colon cancer Neg Hx      ROS:  Full 14 point review of systems complete and found to be negative unless listed above.  Physical Exam: Blood pressure 130/70, pulse 64, temperature 98 F (36.7 C), temperature source Oral, resp. rate (!) 22, height 5\' 6"  (1.676 m), weight 128 lb (58.1 kg), SpO2 93 %.  General: Well developed, well nourished, male in no acute distress Head: Eyes PERRLA, No xanthomas.   Normocephalic and atraumatic, oropharynx without edema or exudate. Dentition: good Lungs: fine rales bilaterally, decreased BS L>R bases, slight end-exp wheeze Heart: HRRR S1 S2, no rub/gallop, soft murmur. pulses are 2+ all 4 extrem.   Neck: No carotid bruits. No lymphadenopathy.  JVD 9-10 cm Abdomen: Bowel sounds present, abdomen soft and non-tender without masses or hernias noted. Msk:  No spine or cva tenderness. No weakness, no joint deformities or effusions. Extremities: No clubbing or cyanosis. No edema.  Neuro: Alert and oriented X 3. No focal deficits noted. Psych:  Good affect, responds appropriately Skin: No rashes or lesions noted.  Labs:   Lab Results  Component Value Date   WBC 7.8 05/20/2017   HGB 14.8 05/20/2017   HCT 44.8 05/20/2017   MCV 98.9 05/20/2017   PLT 168 05/20/2017    BMET BMP Latest Ref Rng & Units 05/20/2017 05/05/2017 03/31/2017  Glucose 65 - 99 mg/dL 132(H) 150(H) 95  BUN 6 - 20 mg/dL 18 19 19   Creatinine 0.61 - 1.24 mg/dL 1.70(H) 1.43(H) 1.51(H)  BUN/Creat Ratio 10 - 24  10.5 13  13   Sodium 135 - 145 mmol/L 136 139 139  Potassium 3.5 - 5.1 mmol/L 3.9 4.3 4.2  Chloride 101 - 111 mmol/L 101 98 99  CO2 22 - 32 mmol/L 26 24 24   Calcium 8.9 - 10.3 mg/dL 9.8 9.9 10.0      Magnesium  Date Value Ref Range Status  05/05/2017 2.1 1.6 - 2.3 mg/dL Final    Recent Labs  05/20/17 1259  TROPIPOC 0.01   B Natriuretic Peptide  Date/Time Value Ref Range Status  05/20/2017 11:25 AM 305.1 (H) 0.0 - 100.0 pg/mL Final   Myoview Impression from 10/2014 Exercise Capacity: Lexiscan with no exercise. BP Response: Normal blood pressure response Clinical Symptoms: Typical symptoms with Lexiscan. ECG Impression: No significant ST segment change suggestive of ischemia. Comparison with Prior Nuclear Study: No images to compare Overall Impression: Low risk stress nuclear study with no evidence of ischemia. [name] basal inferoseptal fixed defect, possible infarct. LV Ejection Fraction: 61%. LV Wall Motion: Septal wall hypokinesis Candee Furbish, MD    Echo Study Conclusions from 03/2014 - Left ventricle: The cavity size was normal. There was mild focal basal hypertrophy of the septum. Systolic function was normal. The estimated ejection fraction was in the range of 60% to 65%. There is akinesis of the basal-inferior myocardium. - Mitral valve: Calcified annulus. Mildly thickened leaflets . Mild regurgitation. - Left atrium: The atrium was moderately to severelydilated. - Right atrium: The atrium was mildly dilated. Impressions: - Compared to 08/13/13, LV function remains normal; biatrial enlargement; MR now appears mild.  ECG:  05/25 SR, wavy baseline but no acute changes  Cath: 10/31/2008 ANGIOGRAPHIC DATA:  1. The saphenous vein graft to distal right coronary artery was widely      patent.  There are minor irregularities in  the saphenous vein      graft, but overall it was relatively smooth and had excellent distal flow.  2. Right coronary artery was  totally occluded.  It had diffuse disease      and segmental severe stenosis.  3. Left main coronary artery is normal.  4. Left circumflex.  The left CFX had a patent stent (DES placed 03/28/2008).      There was somewhat scattered diffuse disease throughout the course of the      obtuse marginal, but there was no severe focal stenosis and no      greater than 50% narrowing.  5. Left anterior descending was a moderate-sized vessel that extended      to the apex and slightly to the inferior wall.  There was somewhat      diffuse and severe disease in the proximal one-half with segmental      disease of 60% nature with 2 areas that were in the 60-80% range.      Septal perforating branches in a diagonal vessel were present at      this location.  The vessel itself was proximally to 2.5 mm in diameter.  6. Left ventricular angiogram.  The left ventricular angiogram was      performed in the RAO position.  Overall cardiac size and silhouette      were normal.  The global ejection fraction was 60%.  There was mild      inferior basilar hypokinesis.  OVERALL IMPRESSION:  Three-vessel coronary disease with totally occluded  right coronary artery with patent saphenous vein graft, patent stent in  the left circumflex coronary artery, and moderately severe to severe  diffuse disease in the proximal left anterior descending with well-  preserved global left ventricular function.  DISCUSSION:  It is felt that Mr. Bolger is probably best managed  medically at this point in time.  This is long segmental disease in the  left anterior descending in a relatively small vessel.  While we have  had successful in the left circumflex, it clearly was much more severe  than this disease in the left anterior descending.  We will manage him  medically at this point in time.  Radiology:  Dg Chest 2 View Result Date: 05/20/2017 CLINICAL DATA:  Increasing shortness of breath for 2-3 days, cough for 3-4 days,  occasional dizziness, dull headaches, low oxygen saturation of 85% today, history COPD, CHF, coronary artery disease post CABG, former smoker, atrial fibrillation, GERD EXAM: CHEST  2 VIEW COMPARISON:  08/13/2013; correlation interval CT chest 02/14/2015 FINDINGS: Normal heart size post CABG and coronary PTCA. Mediastinal contours normal. Atherosclerotic calcification aorta. Mild pulmonary vascular congestion. Interstitial infiltrates likely representing pulmonary edema and mild CHF. Bibasilar effusions and atelectasis. Chronic elevation of LEFT diaphragm. No pneumothorax. Bones demineralized. IMPRESSION: Mild CHF with small bibasilar pleural effusions. Aortic atherosclerosis. Electronically Signed   By: Lavonia Dana M.D.   On: 05/20/2017 12:34    ASSESSMENT AND PLAN:   The patient was seen today by Dr Radford Pax, the patient evaluated and the data reviewed.  Principal Problem:   Acute on chronic diastolic CHF (congestive heart failure), NYHA class 4 (HCC) - his weight is not much different but his SOB and DOE are worse. - per pt report, his O2 sats at home were in the 80s, they have been in the 90s here. - He got Lasix 80 mg IV x 1, good UOP after that.  - He still has a wheezy  cough and still cannot have a conversation without getting SOB - because of the combo of COPD/CHF, feel he walks a fine line.  - consider overnight admit with 2 or so more doses of IV Lasix, then should be fine.  Active Problems:   History of Raynaud's syndrome - see if BP will allow low-dose CCB    GERD (gastroesophageal reflux disease) - chronic issue    COPD (chronic obstructive pulmonary disease) ([place]) - feel he may also have a COPD exacerbation, prior to acute event was just using albuterol prn - would ask him to arrange f/u with Dr Lamonte Sakai, he may need (at least temporarily) additional rx for COPD    Acute on chronic kidney dz stage III - GFR is generally in the 50s, but has been lower in the last month - BUN is at  his baseline, but Cr is higher - follow with diuresis.   SignedRosaria Ferries, Hershal Coria 05/20/2017 3:28 PM [facility]per [phone]  Co-Sign MD

## 2017-05-20 NOTE — ED Triage Notes (Signed)
Pt states recent cough and 3 lbs increase in weight and sob.  Last night O2 sats were 85%, though this am they are 95% with labored breathing.

## 2017-05-20 NOTE — Consult Note (Signed)
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 Patient admitted to the cardiology service. Tried hospitalist consulted for possible COPD exacerbation, patient has minimal wheezing. He denies any fever, denies any yellow or green sputum, he smoked only for about 10 years, and smoked for about a half pack per day.  ED Course:  Vitals: WNL Labs: Creatinine is 1.7. BNP 305 Imaging: CXR: Mild CHF with small bibasilar pleural effusions Interventions: Given IV Lasix in the emergency department  Review of Systems:  Constitutional: negative for anorexia, fevers and sweats Eyes: negative for irritation, redness and visual disturbance Ears, nose, mouth, throat, and face: negative for earaches, epistaxis, nasal congestion and sore throat Respiratory: negative for cough, dyspnea on exertion, sputum and wheezing Cardiovascular: negative for chest pain, dyspnea, lower extremity edema, orthopnea, palpitations and syncope Gastrointestinal: negative for abdominal pain, constipation, diarrhea, melena, nausea and vomiting Genitourinary:negative for dysuria, frequency and hematuria Hematologic/lymphatic: negative for bleeding, easy bruising and lymphadenopathy Musculoskeletal:negative for arthralgias, muscle weakness and stiff joints Neurological: negative for coordination problems, gait problems, headaches and weakness Endocrine: negative for diabetic symptoms including polydipsia, polyuria and weight loss Allergic/Immunologic: negative for anaphylaxis, hay fever and urticaria  Past Medical History:  Diagnosis Date  . Abnormal  nuclear cardiac imaging test March 2011   Has positive EKG response, no perfusion defect and normal EF  . Adenomatous colon polyp 12/2002  . Atrial fibrillation ([place])   . 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 CHF (congestive heart failure) ([place])   . COPD (chronic obstructive pulmonary disease) ([place])   . Coronary artery disease    First MI in 1988. PCI in 1996 with subsequent CABG in 1996 due to restenosis. S/P stents to LCX in 2009. Noted to have residual disease in the LAD in a diffuse manner and atretic LIMA graft. He is managed medically.   . Esophageal stricture   . GERD (gastroesophageal reflux disease)   . 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 History of Raynaud's syndrome   . Hyperlipidemia   . Internal hemorrhoids   . Persistent atrial fibrillation ([place])   . Polio    age 6  . PVC (premature ventricular contraction)   . Scoliosis    mild    Past Surgical History:  Procedure Laterality Date  . APPENDECTOMY     Age 14  . CARDIOVERSION N/A 08/22/2013   Procedure: CARDIOVERSION;  Surgeon: Carlena Bjornstad, MD;  Location: [facility] ENDOSCOPY;  Service: Cardiovascular;  Laterality: N/A;  . CARDIOVERSION N/A 01/16/2014   Procedure: CARDIOVERSION;  Surgeon: Lelon Perla, MD;  Location: [facility] ENDOSCOPY;  Service: Cardiovascular;  Laterality: N/A;  . COLONOSCOPY  2008  . CORONARY ARTERY BYPASS GRAFT  1996   with a LIMA to the LAD, SVG to RCA and SVG to OM  . CORONARY STENT PLACEMENT  2009    LCX  . TONSILLECTOMY       reports that he quit smoking about 50 years ago. His smoking use included Cigarettes. He has a 2.50 pack-year smoking history. He has never used smokeless  tobacco. He reports that he does not drink alcohol or use drugs.  Allergies  Allergen Reactions  . Morphine And Related Other (See Comments)    IV forms- vein irritation IV forms- vein irritation    Family History  Problem Relation Age of Onset  . Heart disease Mother   . Heart  disease Father   . Heart Problems Brother        heart transplant  . Heart disease Maternal Grandmother   . Heart disease Maternal Grandfather   . Heart disease Paternal Grandmother   . Heart disease Paternal Grandfather   . Prostate cancer Paternal Uncle   . Colon cancer Neg Hx     Prior to Admission medications   Medication Sig Start Date End Date Taking? Authorizing Provider  acetaminophen (TYLENOL) 500 MG tablet Take 500 mg by mouth every 12 (twelve) hours.   Yes [provider]  albuterol (PROAIR HFA) 108 (90 BASE) MCG/ACT inhaler Inhale 2 puffs into the lungs every 4 (four) hours as needed for wheezing or shortness of breath (and prior to exercise). 06/13/14  Yes Byrum, Rose Fillers, MD  AMBULATORY NON FORMULARY MEDICATION Testosterone cream 150mg /15% Place 15% onto the skin daily   Yes [provider]  BYSTOLIC 10 MG tablet TAKE 1 TABLET BY MOUTH EVERY DAY 03/30/17  Yes Burtis Junes, NP  cetirizine (ZYRTEC) 10 MG tablet Take 10 mg by mouth.   Yes [provider]  diltiazem (CARDIZEM CD) 120 MG 24 hr capsule Take 1 capsule (120 mg total) by mouth daily. 03/16/17 06/14/17 Yes Burtis Junes, NP  dofetilide (TIKOSYN) 250 MCG capsule TAKE 1 CAPSULE BY MOUTH EVERY 12 HOURS 03/10/17  Yes Burtis Junes, NP  furosemide (LASIX) 40 MG tablet Take 1 tablet 40 mg total by mouth daily. 09/22/16  Yes Larey Dresser, MD  KLOR-CON M20 20 MEQ tablet TAKE 2 TABLETS BY MOUTH DAILY ALTERNATING WITH 1 TABLET DAILY AS DIRECTED 09/14/16  Yes Burtis Junes, NP  lactobacillus acidophilus & bulgar (LACTINEX) chewable tablet Chew 2 tablets by mouth daily.    Yes [provider]  LORazepam (ATIVAN) 0.5 MG tablet Take 0.5 mg by mouth at bedtime as needed for sleep.    Yes [provider]  methocarbamol (ROBAXIN) 500 MG tablet Take 500 mg by mouth as needed for muscle spasms.   Yes [provider]  ranitidine (ZANTAC) 300 MG tablet TAKE 1 TABLET BY MOUTH  EVERY DAY 02/25/15  Yes Larey Dresser, MD  rivaroxaban (XARELTO) 20 MG TABS tablet Take 20 mg by mouth every evening.   Yes [provider]  rosuvastatin (CRESTOR) 40 MG tablet Take 40 mg by mouth every evening.   Yes [provider]  traZODone (DESYREL) 50 MG tablet Take 50 mg by mouth at bedtime.   Yes [provider]  Vitamin D, Ergocalciferol, (DRISDOL) 50000 UNITS CAPS capsule Take 50,000 Units by mouth every 14 (fourteen) days. Take on 1st and 15th   Yes [provider]  isosorbide mononitrate (IMDUR) 60 MG 24 hr tablet Take 0.5 tablets (30 mg total) by mouth daily. Patient not taking: Reported on 05/20/2017 03/16/17   Burtis Junes, NP  KLOR-CON M20 20 MEQ tablet TAKE 2 TABLETS BY MOUTH DAILY ALTERNATING WITH 1 TABLET DAILY AS DIRECTED 05/20/17   Burtis Junes, NP  rosuvastatin (CRESTOR) 40 MG tablet TAKE 1 TABLET BY MOUTH EVERY DAY Patient not taking: Reported on 05/20/2017 06/21/16   Larey Dresser,  MD  XARELTO 20 MG TABS tablet TAKE 1 TABLET BY MOUTH ONCE DAILY Patient not taking: Reported on 05/20/2017 03/10/17   Burtis Junes, NP    Physical Exam:  Vitals:   05/20/17 1515 05/20/17 1615 05/20/17 1630 05/20/17 1700  BP: 130/70 124/63 (!) 113/94 114/72  Pulse: 64 60 (!) 44 61  Resp: (!) 22 20 (!) 22 (!) 21  Temp:      TempSrc:      SpO2: 93% 93% (!) 89% 94%  Weight:      Height:        Constitutional: NAD, calm, comfortable Eyes: PERRL, lids and conjunctivae normal ENMT: Mucous membranes are moist. Posterior pharynx clear of any exudate or lesions.Normal dentition.  Neck: normal, supple, no masses, no thyromegaly Respiratory: clear to auscultation bilaterally, no wheezing, no crackles. Normal respiratory effort. No accessory muscle use.  Cardiovascular: Regular rate and rhythm, no murmurs / rubs / gallops. No extremity edema. 2+ pedal pulses. No carotid bruits.  Abdomen: no tenderness, no masses palpated. No hepatosplenomegaly. Bowel  sounds positive.  Musculoskeletal: no clubbing / cyanosis. No joint deformity upper and lower extremities. Good ROM, no contractures. Normal muscle tone.  Skin: no rashes, lesions, ulcers. No induration Neurologic: CN 2-12 grossly intact. Sensation intact, DTR normal. Strength 5/5 in all 4.  Psychiatric: Normal judgment and insight. Alert and oriented x 3. Normal mood.   Labs on Admission: I have personally reviewed following labs and imaging studies  CBC:  Recent Labs Lab 05/20/17 1125  WBC 7.8  HGB 14.8  HCT 44.8  MCV 98.9  PLT 580   Basic Metabolic Panel:  Recent Labs Lab 05/20/17 1125  NA 136  K 3.9  CL 101  CO2 26  GLUCOSE 132*  BUN 18  CREATININE 1.70*  CALCIUM 9.8   GFR: Estimated Creatinine Clearance: 29.4 mL/min (A) (by C-G formula based on SCr of 1.7 mg/dL (H)). Liver Function Tests: No results for input(s): AST, ALT, ALKPHOS, BILITOT, PROT, ALBUMIN in the last 168 hours. No results for input(s): LIPASE, AMYLASE in the last 168 hours. No results for input(s): AMMONIA in the last 168 hours. Coagulation Profile: No results for input(s): INR, PROTIME in the last 168 hours. Cardiac Enzymes: No results for input(s): CKTOTAL, CKMB, CKMBINDEX, TROPONINI in the last 168 hours. BNP (last 3 results) No results for input(s): PROBNP in the last 8760 hours. HbA1C: No results for input(s): HGBA1C in the last 72 hours. CBG: No results for input(s): GLUCAP in the last 168 hours. Lipid Profile: No results for input(s): CHOL, HDL, LDLCALC, TRIG, CHOLHDL, LDLDIRECT in the last 72 hours. Thyroid Function Tests: No results for input(s): TSH, T4TOTAL, FREET4, T3FREE, THYROIDAB in the last 72 hours. Anemia Panel: No results for input(s): VITAMINB12, FOLATE, FERRITIN, TIBC, IRON, RETICCTPCT in the last 72 hours. Urine analysis: No results found for: COLORURINE, APPEARANCEUR, LABSPEC, PHURINE, GLUCOSEU, HGBUR, BILIRUBINUR, KETONESUR, PROTEINUR, UROBILINOGEN, NITRITE,  LEUKOCYTESUR Sepsis Labs: !!!!!!!!!!!!!!!!!!!!!!!!!!!!!!!!!!!!!!!!!!!! Invalid input(s): PROCALCITONIN, LACTICIDVEN No results found for this or any previous visit (from the past 240 hour(s)).   Radiological Exams on Admission: Dg Chest 2 View  Result Date: 05/20/2017 CLINICAL DATA:  Increasing shortness of breath for 2-3 days, cough for 3-4 days, occasional dizziness, dull headaches, low oxygen saturation of 85% today, history COPD, CHF, coronary artery disease post CABG, former smoker, atrial fibrillation, GERD EXAM: CHEST  2 VIEW COMPARISON:  08/13/2013; correlation interval CT chest 02/14/2015 FINDINGS: Normal heart size post CABG and coronary PTCA. Mediastinal contours normal. Atherosclerotic  calcification aorta. Mild pulmonary vascular congestion. Interstitial infiltrates likely representing pulmonary edema and mild CHF. Bibasilar effusions and atelectasis. Chronic elevation of LEFT diaphragm. No pneumothorax. Bones demineralized. IMPRESSION: Mild CHF with small bibasilar pleural effusions. Aortic atherosclerosis. Electronically Signed   By: Lavonia Dana M.D.   On: 05/20/2017 12:34    EKG: Independently reviewed.   Assessment/Plan Principal Problem:   Acute on chronic diastolic CHF (congestive heart failure), NYHA class 4 (HCC) Active Problems:   History of Raynaud's syndrome   GERD (gastroesophageal reflux disease)   COPD (chronic obstructive pulmonary disease) (HCC)   Acute on chronic diastolic CHF (congestive heart failure) (HCC)    Acute on chronic diastolic CHF -Admitted to the cardiology service, presented with SOB, DOE and CXR findings of CHF. -Started on diuresis with IV Lasix, already had 1.29 L of urine output so far. -This is per cardiology, likely need follow-up with cardiology as outpatient.  COPD -Per cardiology has wheezing, patient followed before with Dr. Lamonte Sakai for left hemidiaphragm paralysis. -He smoked half pack per day for about 10 years. He uses albuterol at  home. -Denies any fever or chills, no green/yellow sputum. -Likely the wheezing is due to "cardiac asthma" and it is clearing now with diuresis. -Lower dose of prednisone, not sure if he is going to need it on discharge. Start DuoNeb was is in the hospital.  Acute on chronic kidney disease stage III -Per cardiology.   Paroxysmal atrial fibrillation -CHA2DS2-VASc of at least 3, he is on Xarelto. Reported he had ablation twice before. -Maintaining NSR on Bystolic and Tikosyn, continue home medications.  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

## 2017-05-20 NOTE — ED Notes (Signed)
Pt returned to room from xray.

## 2017-05-21 ENCOUNTER — Observation Stay (HOSPITAL_BASED_OUTPATIENT_CLINIC_OR_DEPARTMENT_OTHER): Payer: Medicare Other

## 2017-05-21 ENCOUNTER — Encounter (HOSPITAL_COMMUNITY): Payer: Self-pay | Admitting: *Deleted

## 2017-05-21 DIAGNOSIS — Z885 Allergy status to narcotic agent status: Secondary | ICD-10-CM | POA: Diagnosis not present

## 2017-05-21 DIAGNOSIS — Z7901 Long term (current) use of anticoagulants: Secondary | ICD-10-CM | POA: Diagnosis not present

## 2017-05-21 DIAGNOSIS — J986 Disorders of diaphragm: Secondary | ICD-10-CM | POA: Diagnosis not present

## 2017-05-21 DIAGNOSIS — I2510 Atherosclerotic heart disease of native coronary artery without angina pectoris: Secondary | ICD-10-CM | POA: Diagnosis present

## 2017-05-21 DIAGNOSIS — Z8679 Personal history of other diseases of the circulatory system: Secondary | ICD-10-CM | POA: Diagnosis not present

## 2017-05-21 DIAGNOSIS — I509 Heart failure, unspecified: Secondary | ICD-10-CM | POA: Diagnosis not present

## 2017-05-21 DIAGNOSIS — Z79899 Other long term (current) drug therapy: Secondary | ICD-10-CM | POA: Diagnosis not present

## 2017-05-21 DIAGNOSIS — I361 Nonrheumatic tricuspid (valve) insufficiency: Secondary | ICD-10-CM | POA: Diagnosis not present

## 2017-05-21 DIAGNOSIS — J449 Chronic obstructive pulmonary disease, unspecified: Secondary | ICD-10-CM | POA: Diagnosis not present

## 2017-05-21 DIAGNOSIS — I480 Paroxysmal atrial fibrillation: Secondary | ICD-10-CM | POA: Diagnosis not present

## 2017-05-21 DIAGNOSIS — J42 Unspecified chronic bronchitis: Secondary | ICD-10-CM | POA: Diagnosis not present

## 2017-05-21 DIAGNOSIS — B0229 Other postherpetic nervous system involvement: Secondary | ICD-10-CM | POA: Diagnosis not present

## 2017-05-21 DIAGNOSIS — R7989 Other specified abnormal findings of blood chemistry: Secondary | ICD-10-CM | POA: Diagnosis present

## 2017-05-21 DIAGNOSIS — N179 Acute kidney failure, unspecified: Secondary | ICD-10-CM | POA: Diagnosis not present

## 2017-05-21 DIAGNOSIS — Z955 Presence of coronary angioplasty implant and graft: Secondary | ICD-10-CM | POA: Diagnosis not present

## 2017-05-21 DIAGNOSIS — Z87891 Personal history of nicotine dependence: Secondary | ICD-10-CM | POA: Diagnosis not present

## 2017-05-21 DIAGNOSIS — G902 Horner's syndrome: Secondary | ICD-10-CM | POA: Diagnosis not present

## 2017-05-21 DIAGNOSIS — I481 Persistent atrial fibrillation: Secondary | ICD-10-CM | POA: Diagnosis not present

## 2017-05-21 DIAGNOSIS — I5033 Acute on chronic diastolic (congestive) heart failure: Secondary | ICD-10-CM | POA: Diagnosis not present

## 2017-05-21 DIAGNOSIS — E785 Hyperlipidemia, unspecified: Secondary | ICD-10-CM | POA: Diagnosis present

## 2017-05-21 DIAGNOSIS — M419 Scoliosis, unspecified: Secondary | ICD-10-CM | POA: Diagnosis not present

## 2017-05-21 DIAGNOSIS — Z951 Presence of aortocoronary bypass graft: Secondary | ICD-10-CM | POA: Diagnosis not present

## 2017-05-21 DIAGNOSIS — R0902 Hypoxemia: Secondary | ICD-10-CM | POA: Diagnosis present

## 2017-05-21 DIAGNOSIS — I7300 Raynaud's syndrome without gangrene: Secondary | ICD-10-CM | POA: Diagnosis present

## 2017-05-21 DIAGNOSIS — I252 Old myocardial infarction: Secondary | ICD-10-CM | POA: Diagnosis not present

## 2017-05-21 DIAGNOSIS — G14 Postpolio syndrome: Secondary | ICD-10-CM | POA: Diagnosis present

## 2017-05-21 DIAGNOSIS — K219 Gastro-esophageal reflux disease without esophagitis: Secondary | ICD-10-CM | POA: Diagnosis not present

## 2017-05-21 DIAGNOSIS — N184 Chronic kidney disease, stage 4 (severe): Secondary | ICD-10-CM | POA: Diagnosis not present

## 2017-05-21 LAB — ECHOCARDIOGRAM COMPLETE
Ao-asc: 28 cm
CHL CUP DOP CALC LVOT VTI: 21.4 cm
CHL CUP LVOT MV VTI INDEX: 0.84 cm2/m2
CHL CUP STROKE VOLUME: 38 mL
CHL CUP TV REG PEAK VELOCITY: 350 cm/s
E decel time: 169 msec
EERAT: 21.1
FS: 26 % — AB (ref 28–44)
Height: 66 in
IVS/LV PW RATIO, ED: 1
LA diam index: 2.09 cm/m2
LA vol index: 22.7 mL/m2
LASIZE: 34 mm
LAVOL: 36.9 mL
LAVOLA4C: 25.9 mL
LEFT ATRIUM END SYS DIAM: 34 mm
LV E/e'average: 21.1
LV PW d: 10 mm — AB (ref 0.6–1.1)
LV SIMPSON'S DISK: 65
LV dias vol index: 35 mL/m2
LV e' LATERAL: 5.64 cm/s
LV sys vol index: 12 mL/m2
LVDIAVOL: 57 mL — AB (ref 62–150)
LVEEMED: 21.1
LVOT MV VTI: 1.36
LVOT SV: 38 mL
LVOT area: 1.77 cm2
LVOT diameter: 15 mm
LVOT peak grad rest: 4 mmHg
LVOT peak vel: 105 cm/s
LVSYSVOL: 20 mL — AB (ref 21–61)
Lateral S' vel: 10.2 cm/s
MV Annulus VTI: 27.8 cm
MV Dec: 169
MV M vel: 63.8
MV Peak grad: 6 mmHg
MV pk E vel: 119 m/s
MVAP: 3.38 cm2
MVG: 2 mmHg
MVSPHT: 52 ms
TAPSE: 14.8 mm
TDI e' lateral: 5.64
TDI e' medial: 4.5
TRMAXVEL: 350 cm/s
VTI: 135 cm
Weight: 2011.2 oz

## 2017-05-21 LAB — BASIC METABOLIC PANEL
Anion gap: 8 (ref 5–15)
BUN: 21 mg/dL — AB (ref 6–20)
CHLORIDE: 99 mmol/L — AB (ref 101–111)
CO2: 28 mmol/L (ref 22–32)
Calcium: 9.6 mg/dL (ref 8.9–10.3)
Creatinine, Ser: 1.96 mg/dL — ABNORMAL HIGH (ref 0.61–1.24)
GFR calc Af Amer: 36 mL/min — ABNORMAL LOW (ref >60)
GFR, EST NON AFRICAN AMERICAN: 31 mL/min — AB (ref >60)
GLUCOSE: 136 mg/dL — AB (ref 65–99)
POTASSIUM: 4.1 mmol/L (ref 3.5–5.1)
Sodium: 135 mmol/L (ref 135–145)

## 2017-05-21 MED ORDER — FUROSEMIDE 10 MG/ML IJ SOLN
60.0000 mg | Freq: Two times a day (BID) | INTRAMUSCULAR | Status: DC
  Administered 2017-05-21 (×2): 60 mg via INTRAVENOUS
  Filled 2017-05-21 (×2): qty 6

## 2017-05-21 MED ORDER — IPRATROPIUM-ALBUTEROL 0.5-2.5 (3) MG/3ML IN SOLN: 3.0000 mL | Freq: Four times a day (QID) | RESPIRATORY_TRACT | Status: DC | PRN

## 2017-05-21 NOTE — Plan of Care (Signed)
Problem: Safety: Goal: Ability to remain free from injury will improve Outcome: Progressing No incidence of falls during this admission. Call bell within reach. Bed in low and locked position. Patient alert and oriented. Family at bedside. Clean and clear environment maintained. Nonskid footwear being utilized. Patient verbalized understanding of safety instruction.

## 2017-05-21 NOTE — Progress Notes (Addendum)
Patient ID: [name], male   DOB: 1936/08/19, 81 y.o.   MRN: [id number]   SUBJECTIVE: No dyspnea in bed, still coughing.  No chest pain.  Weight down with IV Lasix.  Creatinine up 1.7 => 1.9.   Scheduled Meds: . diltiazem  120 mg Oral Daily  . dofetilide  125 mcg Oral BID  . famotidine  20 mg Oral Daily  . furosemide  60 mg Intravenous BID  . guaiFENesin  1,200 mg Oral BID  . ipratropium-albuterol  3 mL Nebulization BID  . lactobacillus acidophilus & bulgar  2 tablet Oral Daily  . loratadine  10 mg Oral Daily  . nebivolol  10 mg Oral Daily  . potassium chloride SA  20 mEq Oral BID  . predniSONE  40 mg Oral Q breakfast  . Rivaroxaban  15 mg Oral QPM  . rosuvastatin  40 mg Oral QPM  . sodium chloride flush  3 mL Intravenous Q12H  . traZODone  50 mg Oral QHS  . [START ON 05/26/2017] Vitamin D (Ergocalciferol)  50,000 Units Oral Q14 Days   Continuous Infusions: . sodium chloride     PRN Meds:.sodium chloride, acetaminophen, ALPRAZolam, LORazepam, methocarbamol, nitroGLYCERIN, ondansetron (ZOFRAN) IV, sodium chloride flush, zolpidem    Vitals:   05/20/17 2001 05/20/17 2037 05/20/17 2152 05/21/17 0550  BP:  (!) 133/56 (!) 145/48 (!) 114/56  Pulse:  (!) 113 94 66  Resp:   17 16  Temp:  98.1 F (36.7 C) 99.1 F (37.3 C) 97.5 F (36.4 C)  TempSrc:  Oral Oral Oral  SpO2: 98% 93% 93% 94%  Weight:    125 lb 11.2 oz (57 kg)  Height:        Intake/Output Summary (Last 24 hours) at 05/21/17 0753 Last data filed at 05/20/17 2257  Gross per 24 hour  Intake                0 ml  Output             1865 ml  Net            -1865 ml    LABS: Basic Metabolic Panel:  Recent Labs  05/20/17 1125 05/21/17 0324  NA 136 135  K 3.9 4.1  CL 101 99*  CO2 26 28  GLUCOSE 132* 136*  BUN 18 21*  CREATININE 1.70* 1.96*  CALCIUM 9.8 9.6   Liver Function Tests: No results for input(s): AST, ALT, ALKPHOS, BILITOT, PROT, ALBUMIN in the last 72 hours. No results for input(s): LIPASE,  AMYLASE in the last 72 hours. CBC:  Recent Labs  05/20/17 1125  WBC 7.8  HGB 14.8  HCT 44.8  MCV 98.9  PLT 168   Cardiac Enzymes: No results for input(s): CKTOTAL, CKMB, CKMBINDEX, TROPONINI in the last 72 hours. BNP: Invalid input(s): POCBNP D-Dimer: No results for input(s): DDIMER in the last 72 hours. Hemoglobin A1C: No results for input(s): HGBA1C in the last 72 hours. Fasting Lipid Panel: No results for input(s): CHOL, HDL, LDLCALC, TRIG, CHOLHDL, LDLDIRECT in the last 72 hours. Thyroid Function Tests: No results for input(s): TSH, T4TOTAL, T3FREE, THYROIDAB in the last 72 hours.  Invalid input(s): FREET3 Anemia Panel: No results for input(s): VITAMINB12, FOLATE, FERRITIN, TIBC, IRON, RETICCTPCT in the last 72 hours.  RADIOLOGY: Dg Chest 2 View  Result Date: 05/20/2017 CLINICAL DATA:  Increasing shortness of breath for 2-3 days, cough for 3-4 days, occasional dizziness, dull headaches, low oxygen saturation of 85% today, history COPD,  CHF, coronary artery disease post CABG, former smoker, atrial fibrillation, GERD EXAM: CHEST  2 VIEW COMPARISON:  08/13/2013; correlation interval CT chest 02/14/2015 FINDINGS: Normal heart size post CABG and coronary PTCA. Mediastinal contours normal. Atherosclerotic calcification aorta. Mild pulmonary vascular congestion. Interstitial infiltrates likely representing pulmonary edema and mild CHF. Bibasilar effusions and atelectasis. Chronic elevation of LEFT diaphragm. No pneumothorax. Bones demineralized. IMPRESSION: Mild CHF with small bibasilar pleural effusions. Aortic atherosclerosis. Electronically Signed   By: Lavonia Dana M.D.   On: 05/20/2017 12:34    PHYSICAL EXAM General: NAD Neck: JVP 10 cm, no thyromegaly or thyroid nodule.  Lungs: Decreased breath sounds left base, no wheezing.  CV: Nondisplaced PMI.  Heart regular S1/S2, no S3/S4, no murmur.  No peripheral edema.   Abdomen: Soft, nontender, no hepatosplenomegaly, no  distention.  Neurologic: Alert and oriented x 3.  Psych: Normal affect. Extremities: No clubbing or cyanosis.   TELEMETRY: Personally reviewed telemetry pt in NSR  ASSESSMENT AND PLAN: 81 yo with history of CAD s/p CABG, diastolic CHF, paroxysmal atrial fibrillation, post-polio syndrome, elevated left hemidiaphragm, COPD, CVA presented with worsening exertional dyspnea and hypoxemia, suspected acute on chronic diastolic CHF.  1. Acute on chronic diastolic CHF: Last echo in 2015 with EF 60-65%.  Has been on Lasix 40 mg daily at home.  He has baseline exertional dyspnea in the setting of post-polio syndrome and elevated left hemidiaphragm.  Developed progressive exertional dyspnea after trip to the beach where he ate several nights in restaurants, possible sodium load.  No chest pain.  CHF on CXR and elevated BNP.  He diuresed some yesterday.  Today, he remains volume overloaded on exam. Creatinine up from 1.7 => 1.9.  - Will get echo.  - Lasix 60 mg IV bid today.  - Follow creatinine closely.  2. CAD: s/p CABG 1996.  LHC (11/09) SVG-dRCA patent, total occlusion RCA, patent CFX stent, atretic LIMA, serial 60 and 80% proximal LAD stenoses. Lexiscan cardiolite (11/15) with EF 61%, fixed basal inferoseptal defect, no ischemia.  No chest pain.   - Continue Xarelto (no ASA given Xarelto use) and Crestor.  - I think that it would be reasonable to arrange for Cardiolite.  If EF remains normal, can do this as outpatient.  3. Atrial fibrillation: Paroxysmal. He has had an ablation. He remains in NSR on Tikosyn at low dose.  ECG yesterday with acceptable QT interval.  - Repeat QTc in am (be careful with creatinine rise).  - Continue Xarelto.  4. COPD: He as started on prednisone yesterday.  No wheezing on exam today.  Would probably only give a short 3 day burst.  5. AKI: on CKD stage III.  Creatinine to 1.9 today.  Follow closely with diuresis.   [name] 05/21/2017 8:01 AM

## 2017-05-21 NOTE — Progress Notes (Signed)
PROGRESS NOTE  [name]  JXB:[id number] DOB: 1936/11/25 DOA: 05/20/2017 PCP: Burnard Bunting, MD Outpatient Specialists:  Subjective: Still has shortness of breath especially with exertion, cough with minimal sputum Seen with his wife at bedside.  Brief Narrative:  [name] is a 81 y.o. 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 Patient admitted to the cardiology service. Tried hospitalist consulted for possible COPD exacerbation, patient has minimal wheezing. He denies any fever, denies any yellow or green sputum, he smoked only for about 10 years, and smoked for about a half pack per day.  Assessment & Plan:   Principal Problem:   Acute on chronic diastolic CHF (congestive heart failure), NYHA class 4 (HCC) Active Problems:   History of Raynaud's syndrome   GERD (gastroesophageal reflux disease)   COPD (chronic obstructive pulmonary disease) (HCC)   Acute on chronic diastolic CHF (congestive heart failure) (HCC)   Acute on chronic diastolic CHF -Admitted to the cardiology service, presented with SOB, DOE and CXR findings of CHF. -Started on IV diuresis by cardiology, 2.3 negative output so far. -Still has some SOB and DOE, agree with cardiology patient likely will benefit from stress testing as outpatient.  COPD -Mild COPD, very mild exacerbation of any, wheezing resolved. -Recommend prednisone only in the hospital, discontinue on discharge.  Acute on chronic kidney disease stage III -Creatinine in worsened to 1.9, continue diuresis, check BMP in a.m.  Paroxysmal atrial fibrillation -CHA2DS2-VASc of at least 3, he is on Xarelto. Reported he had ablation twice before. -Maintaining NSR on Bystolic and Tikosyn, continue home medications.  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 Yes; Fluid consistency: Thin  Consultants:   TRH  Procedures:   None  Antimicrobials:   None   Objective: Vitals:   05/20/17 2152 05/21/17 0550 05/21/17 0758 05/21/17 0900  BP: (!) 145/48 (!) 114/56  (!) 142/63  Pulse: 94 66  78  Resp: 17 16    Temp: 99.1 F (37.3 C) 97.5 F (36.4 C)    TempSrc: Oral Oral    SpO2: 93% 94% 93%   Weight:  57 kg (125 lb 11.2 oz)    Height:        Intake/Output Summary (Last 24 hours) at 05/21/17 1138 Last data filed at 05/21/17 1058  Gross per 24 hour  Intake              240 ml  Output             2565 ml  Net            -2325 ml   Filed Weights   05/20/17 1106 05/20/17 1756 05/21/17 0550  Weight: 58.1 kg (128 lb) 57.2 kg (126 lb) 57 kg (125 lb 11.2 oz)    Examination: General exam: Appears calm and comfortable  Respiratory system: Clear to auscultation. Respiratory effort normal. Cardiovascular system: S1 & S2 heard, RRR. No JVD, murmurs, rubs, gallops or clicks. No pedal edema. Gastrointestinal system: Abdomen is nondistended, soft and nontender. No organomegaly or masses felt. Normal bowel sounds heard. Central nervous system: Alert and oriented. No focal neurological deficits. Extremities: Symmetric 5 x  5 power. Skin: No rashes, lesions or ulcers Psychiatry: Judgement and insight appear normal. Mood & affect appropriate.   Data Reviewed: I have personally reviewed following labs and imaging studies  CBC:  Recent Labs Lab 05/20/17 1125  WBC 7.8  HGB 14.8  HCT 44.8  MCV 98.9  PLT 161   Basic Metabolic Panel:  Recent Labs Lab 05/20/17 1125 05/21/17 0324  NA 136 135  K 3.9 4.1  CL 101 99*  CO2 26 28  GLUCOSE 132* 136*  BUN 18 21*  CREATININE 1.70* 1.96*  CALCIUM 9.8 9.6   GFR: Estimated Creatinine Clearance: 25 mL/min  (A) (by C-G formula based on SCr of 1.96 mg/dL (H)). Liver Function Tests: No results for input(s): AST, ALT, ALKPHOS, BILITOT, PROT, ALBUMIN in the last 168 hours. No results for input(s): LIPASE, AMYLASE in the last 168 hours. No results for input(s): AMMONIA in the last 168 hours. Coagulation Profile: No results for input(s): INR, PROTIME in the last 168 hours. Cardiac Enzymes: No results for input(s): CKTOTAL, CKMB, CKMBINDEX, TROPONINI in the last 168 hours. BNP (last 3 results) No results for input(s): PROBNP in the last 8760 hours. HbA1C: No results for input(s): HGBA1C in the last 72 hours. CBG: No results for input(s): GLUCAP in the last 168 hours. Lipid Profile: No results for input(s): CHOL, HDL, LDLCALC, TRIG, CHOLHDL, LDLDIRECT in the last 72 hours. Thyroid Function Tests: No results for input(s): TSH, T4TOTAL, FREET4, T3FREE, THYROIDAB in the last 72 hours. Anemia Panel: No results for input(s): VITAMINB12, FOLATE, FERRITIN, TIBC, IRON, RETICCTPCT in the last 72 hours. Urine analysis: No results found for: COLORURINE, APPEARANCEUR, LABSPEC, PHURINE, GLUCOSEU, HGBUR, BILIRUBINUR, KETONESUR, PROTEINUR, UROBILINOGEN, NITRITE, LEUKOCYTESUR Sepsis Labs: @LABRCNTIP (procalcitonin:4,lacticidven:4)  )No results found for this or any previous visit (from the past 240 hour(s)).   Invalid input(s): PROCALCITONIN, LACTICACIDVEN   Radiology Studies: Dg Chest 2 View  Result Date: 05/20/2017 CLINICAL DATA:  Increasing shortness of breath for 2-3 days, cough for 3-4 days, occasional dizziness, dull headaches, low oxygen saturation of 85% today, history COPD, CHF, coronary artery disease post CABG, former smoker, atrial fibrillation, GERD EXAM: CHEST  2 VIEW COMPARISON:  08/13/2013; correlation interval CT chest 02/14/2015 FINDINGS: Normal heart size post CABG and coronary PTCA. Mediastinal contours normal. Atherosclerotic calcification aorta. Mild pulmonary vascular congestion.  Interstitial infiltrates likely representing pulmonary edema and mild CHF. Bibasilar effusions and atelectasis. Chronic elevation of LEFT diaphragm. No pneumothorax. Bones demineralized. IMPRESSION: Mild CHF with small bibasilar pleural effusions. Aortic atherosclerosis. Electronically Signed   By: Lavonia Dana M.D.   On: 05/20/2017 12:34        Scheduled Meds: . diltiazem  120 mg Oral Daily  . dofetilide  125 mcg Oral BID  . famotidine  20 mg Oral Daily  . furosemide  60 mg Intravenous BID  . guaiFENesin  1,200 mg Oral BID  . ipratropium-albuterol  3 mL Nebulization BID  . lactobacillus acidophilus & bulgar  2 tablet Oral Daily  . loratadine  10 mg Oral Daily  . nebivolol  10 mg Oral Daily  . potassium chloride SA  20 mEq Oral BID  . predniSONE  40 mg Oral Q breakfast  . Rivaroxaban  15 mg Oral QPM  . rosuvastatin  40 mg Oral QPM  . sodium chloride flush  3 mL Intravenous Q12H  . traZODone  50 mg Oral QHS  . [START ON 05/26/2017] Vitamin D (Ergocalciferol)  50,000 Units Oral Q14 Days   Continuous Infusions: . sodium chloride  LOS: 0 days    Time spent: 35 minutes    Ida Uppal A, MD [facility] Pager [phone]  If 7PM-7AM, please contact night-coverage [email or web address] Password [facility] 05/21/2017, 11:38 AM

## 2017-05-21 NOTE — Discharge Instructions (Signed)

## 2017-05-22 ENCOUNTER — Other Ambulatory Visit: Payer: Self-pay | Admitting: Nurse Practitioner

## 2017-05-22 ENCOUNTER — Encounter (HOSPITAL_COMMUNITY): Payer: Self-pay | Admitting: Nurse Practitioner

## 2017-05-22 DIAGNOSIS — R072 Precordial pain: Secondary | ICD-10-CM

## 2017-05-22 DIAGNOSIS — I5032 Chronic diastolic (congestive) heart failure: Secondary | ICD-10-CM

## 2017-05-22 DIAGNOSIS — I5043 Acute on chronic combined systolic (congestive) and diastolic (congestive) heart failure: Secondary | ICD-10-CM

## 2017-05-22 LAB — CBC
HCT: 42.6 % (ref 39.0–52.0)
Hemoglobin: 14 g/dL (ref 13.0–17.0)
MCH: 32.1 pg (ref 26.0–34.0)
MCHC: 32.9 g/dL (ref 30.0–36.0)
MCV: 97.7 fL (ref 78.0–100.0)
PLATELETS: 180 10*3/uL (ref 150–400)
RBC: 4.36 MIL/uL (ref 4.22–5.81)
RDW: 13.6 % (ref 11.5–15.5)
WBC: 13.1 10*3/uL — AB (ref 4.0–10.5)

## 2017-05-22 LAB — BASIC METABOLIC PANEL
Anion gap: 9 (ref 5–15)
BUN: 30 mg/dL — AB (ref 6–20)
CALCIUM: 9.5 mg/dL (ref 8.9–10.3)
CO2: 27 mmol/L (ref 22–32)
Chloride: 99 mmol/L — ABNORMAL LOW (ref 101–111)
Creatinine, Ser: 1.78 mg/dL — ABNORMAL HIGH (ref 0.61–1.24)
GFR calc Af Amer: 40 mL/min — ABNORMAL LOW (ref >60)
GFR, EST NON AFRICAN AMERICAN: 35 mL/min — AB (ref >60)
Glucose, Bld: 153 mg/dL — ABNORMAL HIGH (ref 65–99)
POTASSIUM: 3.9 mmol/L (ref 3.5–5.1)
SODIUM: 135 mmol/L (ref 135–145)

## 2017-05-22 LAB — BRAIN NATRIURETIC PEPTIDE: B NATRIURETIC PEPTIDE 5: 391.7 pg/mL — AB (ref 0.0–100.0)

## 2017-05-22 MED ORDER — POTASSIUM CHLORIDE CRYS ER 20 MEQ PO TBCR: 20.0000 meq | EXTENDED_RELEASE_TABLET | Freq: Two times a day (BID) | ORAL | 3 refills | Status: DC

## 2017-05-22 MED ORDER — FUROSEMIDE 40 MG PO TABS: ORAL_TABLET | ORAL | 3 refills | Status: DC

## 2017-05-22 MED ORDER — NITROGLYCERIN 0.4 MG SL SUBL: 0.4000 mg | SUBLINGUAL_TABLET | SUBLINGUAL | 3 refills | Status: DC | PRN

## 2017-05-22 MED ORDER — FUROSEMIDE 40 MG PO TABS
40.0000 mg | ORAL_TABLET | Freq: Two times a day (BID) | ORAL | Status: DC
  Administered 2017-05-22: 40 mg via ORAL
  Filled 2017-05-22: qty 1

## 2017-05-22 MED ORDER — RIVAROXABAN 15 MG PO TABS: 15.0000 mg | ORAL_TABLET | Freq: Every evening | ORAL | 3 refills | Status: DC

## 2017-05-22 NOTE — Progress Notes (Addendum)
Patient ID: [name], male   DOB: 1936-02-26, 81 y.o.   MRN: [id number]   SUBJECTIVE: Breathing better today, reasonable diuresis though not much change in weight (?accuracy).  He feels like he is back to baseline and wants to go home.   ECG today: NSR, QTc 440 msec.   Echo (5/26): EF 65-70%, restrictive diastolic function, moderate RV dilation with normal function, PASP 57 mmHg, dilated IVC  Scheduled Meds: . diltiazem  120 mg Oral Daily  . dofetilide  125 mcg Oral BID  . famotidine  20 mg Oral Daily  . furosemide  60 mg Intravenous BID  . guaiFENesin  1,200 mg Oral BID  . lactobacillus acidophilus & bulgar  2 tablet Oral Daily  . loratadine  10 mg Oral Daily  . nebivolol  10 mg Oral Daily  . potassium chloride SA  20 mEq Oral BID  . predniSONE  40 mg Oral Q breakfast  . Rivaroxaban  15 mg Oral QPM  . rosuvastatin  40 mg Oral QPM  . sodium chloride flush  3 mL Intravenous Q12H  . traZODone  50 mg Oral QHS  . [START ON 05/26/2017] Vitamin D (Ergocalciferol)  50,000 Units Oral Q14 Days   Continuous Infusions: . sodium chloride     PRN Meds:.sodium chloride, acetaminophen, ALPRAZolam, ipratropium-albuterol, LORazepam, methocarbamol, nitroGLYCERIN, ondansetron (ZOFRAN) IV, sodium chloride flush, zolpidem    Vitals:   05/21/17 1239 05/21/17 2004 05/21/17 2124 05/22/17 0421  BP: (!) 125/57 (!) 122/58  (!) 115/59  Pulse: 70 66  64  Resp: 20 18  18   Temp: 97.4 F (36.3 C) 97.7 F (36.5 C)  97.5 F (36.4 C)  TempSrc: Oral Oral  Oral  SpO2: 95% 95% 95% 93%  Weight:    126 lb (57.2 kg)  Height:        Intake/Output Summary (Last 24 hours) at 05/22/17 0858 Last data filed at 05/22/17 0426  Gross per 24 hour  Intake             1600 ml  Output             2450 ml  Net             -850 ml    LABS: Basic Metabolic Panel:  Recent Labs  05/21/17 0324 05/22/17 0512  NA 135 135  K 4.1 3.9  CL 99* 99*  CO2 28 27  GLUCOSE 136* 153*  BUN 21* 30*  CREATININE 1.96*  1.78*  CALCIUM 9.6 9.5   Liver Function Tests: No results for input(s): AST, ALT, ALKPHOS, BILITOT, PROT, ALBUMIN in the last 72 hours. No results for input(s): LIPASE, AMYLASE in the last 72 hours. CBC:  Recent Labs  05/20/17 1125 05/22/17 0512  WBC 7.8 13.1*  HGB 14.8 14.0  HCT 44.8 42.6  MCV 98.9 97.7  PLT 168 180   Cardiac Enzymes: No results for input(s): CKTOTAL, CKMB, CKMBINDEX, TROPONINI in the last 72 hours. BNP: Invalid input(s): POCBNP D-Dimer: No results for input(s): DDIMER in the last 72 hours. Hemoglobin A1C: No results for input(s): HGBA1C in the last 72 hours. Fasting Lipid Panel: No results for input(s): CHOL, HDL, LDLCALC, TRIG, CHOLHDL, LDLDIRECT in the last 72 hours. Thyroid Function Tests: No results for input(s): TSH, T4TOTAL, T3FREE, THYROIDAB in the last 72 hours.  Invalid input(s): FREET3 Anemia Panel: No results for input(s): VITAMINB12, FOLATE, FERRITIN, TIBC, IRON, RETICCTPCT in the last 72 hours.  RADIOLOGY: Dg Chest 2 View  Result Date: 05/20/2017  CLINICAL DATA:  Increasing shortness of breath for 2-3 days, cough for 3-4 days, occasional dizziness, dull headaches, low oxygen saturation of 85% today, history COPD, CHF, coronary artery disease post CABG, former smoker, atrial fibrillation, GERD EXAM: CHEST  2 VIEW COMPARISON:  08/13/2013; correlation interval CT chest 02/14/2015 FINDINGS: Normal heart size post CABG and coronary PTCA. Mediastinal contours normal. Atherosclerotic calcification aorta. Mild pulmonary vascular congestion. Interstitial infiltrates likely representing pulmonary edema and mild CHF. Bibasilar effusions and atelectasis. Chronic elevation of LEFT diaphragm. No pneumothorax. Bones demineralized. IMPRESSION: Mild CHF with small bibasilar pleural effusions. Aortic atherosclerosis. Electronically Signed   By: Lavonia Dana M.D.   On: 05/20/2017 12:34    PHYSICAL EXAM General: NAD Neck: JVP 8-9 cm, no thyromegaly or thyroid  nodule.  Lungs: Decreased breath sounds left base, no wheezing.  CV: Nondisplaced PMI.  Heart regular S1/S2, no S3/S4, no murmur.  No peripheral edema.   Abdomen: Soft, nontender, no hepatosplenomegaly, no distention.  Neurologic: Alert and oriented x 3.  Psych: Normal affect. Extremities: No clubbing or cyanosis.   TELEMETRY: Personally reviewed telemetry pt in NSR  ASSESSMENT AND PLAN: 81 yo with history of CAD s/p CABG, diastolic CHF, paroxysmal atrial fibrillation, post-polio syndrome, elevated left hemidiaphragm, COPD, CVA presented with worsening exertional dyspnea and hypoxemia, suspected acute on chronic diastolic CHF.  1. Acute on chronic diastolic CHF: Echo this admission with EF 65-70%, restrictive diastolic function, dilated IVC and PASP 57 mmHg.  Has been on Lasix 40 mg daily at home.  He has baseline exertional dyspnea in the setting of post-polio syndrome and elevated left hemidiaphragm.  Developed progressive exertional dyspnea after trip to the beach where he ate several nights in restaurants, possible sodium load.  No chest pain.  CHF on CXR and elevated BNP.  He has diuresed some in the hospital, still mildly volume overloaded but feels much better, back to baseline, and really wants to go home.  - Would transition to Lasix 40 mg po bid (was on 40 daily at home).  Take Lasix 40 mg po bid x 5 days, then 40 qam/20 qpm after that.  Increase KCl to 40 mEq daily.  - Follow creatinine closely.  2. CAD: s/p CABG 1996.  LHC (11/09) SVG-dRCA patent, total occlusion RCA, patent CFX stent, atretic LIMA, serial 60 and 80% proximal LAD stenoses. Lexiscan cardiolite (11/15) with EF 61%, fixed basal inferoseptal defect, no ischemia.  No chest pain.   - Continue Xarelto (no ASA given Xarelto use) and Crestor.  - I would like him to have a Cardiolite done.  EF remains vigorous on echo, can do this as outpatient.   3. Atrial fibrillation: Paroxysmal. He has had an ablation. He remains in NSR on  Tikosyn at low dose.   - ECG today shows good QTc 440 msec.  - Continue Xarelto.  4. COPD: He as started on prednisone.  No wheezing on exam today.  Would probably only give a short 3 day burst (stop after today).  5. AKI: on CKD stage III.  Improved, creatinine 1.7 today.  6. Disposition: I think he can go home today.  Will need followup with me in CHF clinic in about 2 wks (may overbook).  Needs BMET in 1 week.  Needs Cardiolite as outpatient.  Meds for home will stay the same as prior to admission, except Lasix will be increased to 40 mg bid x 5 days then 40 qam/20 qpm and KCl will be increased to 40 mEq daily.  [name] 05/22/2017 8:58 AM

## 2017-05-22 NOTE — Progress Notes (Signed)
Pt discharge education provided at bedside with pt and pt wife. Pt IV discontinued, catheter intact and telemetry removed. Pt has all belongings including discharge paperwork. Pt discharged via wheelchair with nurse tech.  [name]

## 2017-05-22 NOTE — Discharge Summary (Signed)
Discharge Summary    Patient ID: [name],  MRN: [id number], DOB/AGE: 07-14-1936 81 y.o.  Admit date: 05/20/2017 Discharge date: 05/22/2017  Primary Care Provider: Burnard Bunting Primary Cardiologist: Einar Crow, MD   Discharge Diagnoses    Principal Problem:   Acute on chronic diastolic CHF (congestive heart failure), NYHA class 4 (HCC)  **Net negative diuresis of 2.5L.  **Discharge wt of 126 lbs.  Active Problems:   CAD (coronary artery disease)   COPD (chronic obstructive pulmonary disease) (HCC)  **3 day prednisone burst this admission.   A-fib ([place])   History of Raynaud's syndrome   History of post-polio syndrome   GERD (gastroesophageal reflux disease)   Hyperlipidemia   CKD III-IV  **Xarelto dose reduced to 15 mg daily this admission.  Allergies Allergies  Allergen Reactions  . Morphine And Related Other (See Comments)    IV forms- vein irritation IV forms- vein irritation    Diagnostic Studies/Procedures    2D Echocardiogram 5.26.2018  Study Conclusions   - Left ventricle: The cavity size was normal. There was mild focal   basal hypertrophy of the septum. Systolic function was vigorous.   The estimated ejection fraction was in the range of 65% to 70%.   Doppler parameters are consistent with restrictive physiology,   indicative of decreased left ventricular diastolic compliance   and/or increased left atrial pressure. Doppler parameters are   consistent with elevated ventricular end-diastolic filling   pressure. - Aortic valve: Structurally normal valve. There was trivial   regurgitation. - Ascending aorta: The ascending aorta was normal in size. - Mitral valve: There was mild regurgitation. Valve area by   continuity equation (using LVOT flow): 1.36 cm^2. - Left atrium: The atrium was mildly dilated. - Right ventricle: The cavity size was moderately dilated. Wall   thickness was normal. Systolic function was normal. - Right atrium: The  atrium was moderately dilated. - Tricuspid valve: There was mild regurgitation. - Pulmonary arteries: Systolic pressure was moderately increased.   PA peak pressure: 57 mm Hg (S). - Inferior vena cava: The vessel was dilated. The respirophasic   diameter changes were blunted (< 50%), consistent with elevated   central venous pressure. - Pericardium, extracardiac: The pericardium was normal in   appearance. _____________   History of Present Illness     81 y/o ? with a h/o CAD s/p CABG in 1996, AFib s/p catheter ablation now on chronic tikosyn and xarelto, GERD, post-poli syndrome, h/o brainstem stroke w/ Horner syndrome, post-herpetic neuralgia, and chronic diastolic CHF.  He has been having progressive DOE at home with with some wheezing and cough.  He presented to the [facility] ER on 5/25 due to progressive symptoms and was noted to be volume overloaded on exam.  He was admitted for further evaluation.  Hospital Course     Consultants: None   Pt was placed on IV lasix with good response and symptomatic improvement.  A short, 3 day burst of prednisone was also added in the setting of wheezing and cough.  Echo showed and EF of 65-70% with restrictive physiology.  [name] has diuresed a net negative of 2.5L.  He has been ambulating without difficulty and is stable at his dry weight of 126 lbs.  His creat, which was elevated at 1.96 on admission, is returning closer to his prior baseline, and is 1.78 this morning.  In the setting of a creatinine clearance under 50, his xarelto dose has been reduced to 15  mg daily.  He has been transitioned to oral lasix and will be discharged home today in good condition.  We will arrange for a follow-up BMET in 1 wk with CHF clinic f/u in 2 wks.  We will also arrange for repeat stress testing given progressive dyspnea.   _____________  Discharge Vitals Blood pressure (!) 126/51, pulse 85, temperature 97.5 F (36.4 C), temperature source Oral, resp. rate 17,  height 5\' 6"  (1.676 m), weight 126 lb (57.2 kg), SpO2 93 %.  Filed Weights   05/20/17 1756 05/21/17 0550 05/22/17 0421  Weight: 126 lb (57.2 kg) 125 lb 11.2 oz (57 kg) 126 lb (57.2 kg)    Labs & Radiologic Studies    CBC  Recent Labs  05/20/17 1125 05/22/17 0512  WBC 7.8 13.1*  HGB 14.8 14.0  HCT 44.8 42.6  MCV 98.9 97.7  PLT 168 269   Basic Metabolic Panel  Recent Labs  05/21/17 0324 05/22/17 0512  NA 135 135  K 4.1 3.9  CL 99* 99*  CO2 28 27  GLUCOSE 136* 153*  BUN 21* 30*  CREATININE 1.96* 1.78*  CALCIUM 9.6 9.5  ____________  Dg Chest 2 View  Result Date: 05/20/2017 CLINICAL DATA:  Increasing shortness of breath for 2-3 days, cough for 3-4 days, occasional dizziness, dull headaches, low oxygen saturation of 85% today, history COPD, CHF, coronary artery disease post CABG, former smoker, atrial fibrillation, GERD EXAM: CHEST  2 VIEW COMPARISON:  08/13/2013; correlation interval CT chest 02/14/2015 FINDINGS: Normal heart size post CABG and coronary PTCA. Mediastinal contours normal. Atherosclerotic calcification aorta. Mild pulmonary vascular congestion. Interstitial infiltrates likely representing pulmonary edema and mild CHF. Bibasilar effusions and atelectasis. Chronic elevation of LEFT diaphragm. No pneumothorax. Bones demineralized. IMPRESSION: Mild CHF with small bibasilar pleural effusions. Aortic atherosclerosis. Electronically Signed   By: Lavonia Dana M.D.   On: 05/20/2017 12:34   Disposition   Pt is being discharged home today in good condition.  Follow-up Plans & Appointments    Follow-up Information    Larey Dresser, MD Follow up in 2 week(s).   Specialty:  Cardiology Why:  We will arrange for a blood chemistry (lab work) in one week, follow-up stress test, and also follow-up with [name] in ~ 2 wks and contact you with appointment times/dates. Contact information: [address] [place] [address] [phone]             Discharge Medications   Current Discharge Medication List    START taking these medications   Details  nitroGLYCERIN (NITROSTAT) 0.4 MG SL tablet Place 1 tablet (0.4 mg total) under the tongue every 5 (five) minutes x 3 doses as needed for chest pain. Qty: 25 tablet, Refills: 3      CONTINUE these medications which have CHANGED   Details  furosemide (LASIX) 40 MG tablet 40mg  BID x 5 days followed by 40mg  Daily AM and 20mg  (1/2 tab) Daily PM. Qty: 140 tablet, Refills: 3    potassium chloride SA (KLOR-CON M20) 20 MEQ tablet Take 1 tablet (20 mEq total) by mouth 2 (two) times daily. Qty: 180 tablet, Refills: 3    rivaroxaban (XARELTO) 15 MG TABS tablet Take 1 tablet (15 mg total) by mouth every evening. Qty: 90 tablet, Refills: 3      CONTINUE these medications which have NOT CHANGED   Details  acetaminophen (TYLENOL) 500 MG tablet Take 500 mg by mouth every 12 (twelve) hours.    albuterol (PROAIR  HFA) 108 (90 BASE) MCG/ACT inhaler Inhale 2 puffs into the lungs every 4 (four) hours as needed for wheezing or shortness of breath (and prior to exercise). Qty: 1 Inhaler, Refills: 3    AMBULATORY NON FORMULARY MEDICATION Testosterone cream 150mg /15% Place 15% onto the skin daily    BYSTOLIC 10 MG tablet TAKE 1 TABLET BY MOUTH EVERY DAY Qty: 30 tablet, Refills: 5    cetirizine (ZYRTEC) 10 MG tablet Take 10 mg by mouth.    diltiazem (CARDIZEM CD) 120 MG 24 hr capsule Take 1 capsule (120 mg total) by mouth daily. Qty: 90 capsule, Refills: 3    dofetilide (TIKOSYN) 250 MCG capsule TAKE 1 CAPSULE BY MOUTH EVERY 12 HOURS Qty: 180 capsule, Refills: 1    lactobacillus acidophilus & bulgar (LACTINEX) chewable tablet Chew 2 tablets by mouth daily.     LORazepam (ATIVAN) 0.5 MG tablet Take 0.5 mg by mouth at bedtime as needed for sleep.     methocarbamol (ROBAXIN) 500 MG tablet Take 500 mg by mouth as needed for muscle spasms.    ranitidine (ZANTAC) 300 MG tablet TAKE 1 TABLET  BY MOUTH EVERY DAY Qty: 30 tablet, Refills: 3    rosuvastatin (CRESTOR) 40 MG tablet Take 40 mg by mouth every evening.    traZODone (DESYREL) 50 MG tablet Take 50 mg by mouth at bedtime.    Vitamin D, Ergocalciferol, (DRISDOL) 50000 UNITS CAPS capsule Take 50,000 Units by mouth every 14 (fourteen) days. Take on 1st and 15th         Outstanding Labs/Studies   BMET in 1 wk. Lexiscan MV w/in next two wks  Duration of Discharge Encounter   Greater than 30 minutes including physician time.  Signed, Murray Hodgkins NP 05/22/2017, 10:40 AM

## 2017-05-24 NOTE — Telephone Encounter (Signed)
S/w pt's wife per (DPR), stated [name] wanted to see pt back in two weeks and pt will wait till  Dr. Claris Gladden office calls to schedule. Offered to make all appointment's,  stated wanted to wait for Dr. Claris Gladden office to call and schedule lab and stress test.  Will send to [facility] to [place].

## 2017-05-24 NOTE — Telephone Encounter (Signed)
Noted  

## 2017-05-30 ENCOUNTER — Ambulatory Visit (HOSPITAL_COMMUNITY)
Admission: RE | Admit: 2017-05-30 | Discharge: 2017-05-30 | Disposition: A | Discharge status: Home / Self Care | Payer: Medicare Other | Source: Ambulatory Visit | Attending: Cardiology | Admitting: Cardiology

## 2017-05-30 ENCOUNTER — Other Ambulatory Visit (HOSPITAL_COMMUNITY): Payer: Self-pay | Admitting: *Deleted

## 2017-05-30 DIAGNOSIS — I4891 Unspecified atrial fibrillation: Secondary | ICD-10-CM

## 2017-05-30 LAB — BASIC METABOLIC PANEL
Anion gap: 9 (ref 5–15)
BUN: 19 mg/dL (ref 6–20)
CALCIUM: 10 mg/dL (ref 8.9–10.3)
CO2: 28 mmol/L (ref 22–32)
CREATININE: 1.61 mg/dL — AB (ref 0.61–1.24)
Chloride: 100 mmol/L — ABNORMAL LOW (ref 101–111)
GFR calc non Af Amer: 39 mL/min — ABNORMAL LOW (ref >60)
GFR, EST AFRICAN AMERICAN: 45 mL/min — AB (ref >60)
GLUCOSE: 105 mg/dL — AB (ref 65–99)
Potassium: 3.9 mmol/L (ref 3.5–5.1)
Sodium: 137 mmol/L (ref 135–145)

## 2017-06-01 ENCOUNTER — Telehealth (HOSPITAL_COMMUNITY): Payer: Self-pay | Admitting: *Deleted

## 2017-06-01 NOTE — Telephone Encounter (Signed)
Left message on voicemail in reference to upcoming appointment scheduled for 06/06/17. Phone number given for a call back so details instructions can be given.  Kirstie Peri

## 2017-06-05 ENCOUNTER — Other Ambulatory Visit: Payer: Self-pay | Admitting: Cardiology

## 2017-06-06 ENCOUNTER — Ambulatory Visit (HOSPITAL_COMMUNITY): Payer: Medicare Other | Attending: Cardiology

## 2017-06-06 ENCOUNTER — Other Ambulatory Visit: Payer: Self-pay | Admitting: Nurse Practitioner

## 2017-06-06 DIAGNOSIS — I10 Essential (primary) hypertension: Secondary | ICD-10-CM | POA: Diagnosis not present

## 2017-06-06 DIAGNOSIS — E785 Hyperlipidemia, unspecified: Secondary | ICD-10-CM | POA: Insufficient documentation

## 2017-06-06 DIAGNOSIS — R072 Precordial pain: Secondary | ICD-10-CM | POA: Insufficient documentation

## 2017-06-06 DIAGNOSIS — Z9889 Other specified postprocedural states: Secondary | ICD-10-CM | POA: Insufficient documentation

## 2017-06-06 DIAGNOSIS — I4891 Unspecified atrial fibrillation: Secondary | ICD-10-CM | POA: Diagnosis not present

## 2017-06-06 LAB — MYOCARDIAL PERFUSION IMAGING
CHL CUP RESTING HR STRESS: 76 {beats}/min
CSEPPHR: 96 {beats}/min
LV sys vol: 35 mL
LVDIAVOL: 77 mL (ref 62–150)
RATE: 0.28
SDS: 2
SRS: 10
SSS: 12
TID: 0.95

## 2017-06-06 MED ORDER — TECHNETIUM TC 99M TETROFOSMIN IV KIT
32.8000 | PACK | Freq: Once | INTRAVENOUS | Status: AC | PRN
  Administered 2017-06-06: 32.8 via INTRAVENOUS
  Filled 2017-06-06: qty 33

## 2017-06-06 MED ORDER — REGADENOSON 0.4 MG/5ML IV SOLN
0.4000 mg | Freq: Once | INTRAVENOUS | Status: AC
  Administered 2017-06-06: 0.4 mg via INTRAVENOUS

## 2017-06-06 MED ORDER — TECHNETIUM TC 99M TETROFOSMIN IV KIT
11.0000 | PACK | Freq: Once | INTRAVENOUS | Status: AC | PRN
  Administered 2017-06-06: 11 via INTRAVENOUS
  Filled 2017-06-06: qty 11

## 2017-06-13 ENCOUNTER — Encounter (HOSPITAL_COMMUNITY): Payer: Self-pay

## 2017-06-13 ENCOUNTER — Ambulatory Visit (HOSPITAL_COMMUNITY)
Admission: RE | Admit: 2017-06-13 | Discharge: 2017-06-13 | Disposition: A | Discharge status: Home / Self Care | Payer: Medicare Other | Source: Ambulatory Visit | Attending: Cardiology | Admitting: Cardiology

## 2017-06-13 VITALS — BP 131/63 | HR 63 | Wt 127.5 lb

## 2017-06-13 DIAGNOSIS — Z951 Presence of aortocoronary bypass graft: Secondary | ICD-10-CM | POA: Diagnosis not present

## 2017-06-13 DIAGNOSIS — K219 Gastro-esophageal reflux disease without esophagitis: Secondary | ICD-10-CM | POA: Diagnosis not present

## 2017-06-13 DIAGNOSIS — Z79899 Other long term (current) drug therapy: Secondary | ICD-10-CM | POA: Insufficient documentation

## 2017-06-13 DIAGNOSIS — E785 Hyperlipidemia, unspecified: Secondary | ICD-10-CM | POA: Diagnosis not present

## 2017-06-13 DIAGNOSIS — N183 Chronic kidney disease, stage 3 (moderate): Secondary | ICD-10-CM | POA: Insufficient documentation

## 2017-06-13 DIAGNOSIS — Z7901 Long term (current) use of anticoagulants: Secondary | ICD-10-CM | POA: Insufficient documentation

## 2017-06-13 DIAGNOSIS — I4891 Unspecified atrial fibrillation: Secondary | ICD-10-CM | POA: Diagnosis not present

## 2017-06-13 DIAGNOSIS — R0609 Other forms of dyspnea: Secondary | ICD-10-CM | POA: Diagnosis not present

## 2017-06-13 DIAGNOSIS — Z8673 Personal history of transient ischemic attack (TIA), and cerebral infarction without residual deficits: Secondary | ICD-10-CM | POA: Diagnosis not present

## 2017-06-13 DIAGNOSIS — I252 Old myocardial infarction: Secondary | ICD-10-CM | POA: Insufficient documentation

## 2017-06-13 DIAGNOSIS — M419 Scoliosis, unspecified: Secondary | ICD-10-CM | POA: Diagnosis not present

## 2017-06-13 DIAGNOSIS — I7300 Raynaud's syndrome without gangrene: Secondary | ICD-10-CM | POA: Diagnosis not present

## 2017-06-13 DIAGNOSIS — I5032 Chronic diastolic (congestive) heart failure: Secondary | ICD-10-CM

## 2017-06-13 DIAGNOSIS — Z9889 Other specified postprocedural states: Secondary | ICD-10-CM | POA: Insufficient documentation

## 2017-06-13 DIAGNOSIS — Z87891 Personal history of nicotine dependence: Secondary | ICD-10-CM | POA: Diagnosis not present

## 2017-06-13 DIAGNOSIS — I2582 Chronic total occlusion of coronary artery: Secondary | ICD-10-CM | POA: Diagnosis not present

## 2017-06-13 DIAGNOSIS — I2510 Atherosclerotic heart disease of native coronary artery without angina pectoris: Secondary | ICD-10-CM

## 2017-06-13 NOTE — Patient Instructions (Signed)
EKG this Wednesday  Follow up in 1 Month

## 2017-06-13 NOTE — Progress Notes (Signed)
Patient ID: [name], male   DOB: [birth year], 81 y.o.   MRN: [id number] PCP: [name] Cardiology: [name]  81 yo with history of CAD s/p CABG presents for cardiology followup.  His last cath was in 11/09.  The SVG-distal RCA was patent, the CFX system was patent.  His LIMA was atretic and there were serial 60% and 80% stenoses in the native LAD.  He was managed medically.  Echo in 8/14 showed EF 55-60% with moderate MR and moderate TR.   He was initially noted to have atrial fibrillation in the summer of 2014. He was started on Xarelto and cardioverted to NSR in 8/14.  Recurrent atrial fibrillation was noted in 1/15, and he was cardioverted to NSR again.  This time, NSR did not hold long. By 5/15, he was in persistent atrial fibrillation.  I referred him to [facility] where he had atrial fibrillation ablation and Tikosyn loading.  Ranolazine was stopped due to risk of QT prolongation and Imdur was started as an anti-anginal instead.  Lexiscan Cardiolite in 11/15 showed no ischemia.    CTA chest done in 2/16 to look for evidence for PV stenosis post-AF ablation.  This showed mild short-segment narrowing of the left inferior pulmonary vein.   Patient was admitted in 5/18 with acute on chronic diastolic CHF.  He had been at the beach for several days and ate out a number of times, probably getting a significant sodium load.  No chest pain.  He was in normal sinus rhythm.  He was started on IV Lasix and diuresed.  Echo in 5/18 showed EF 65-70% with moderately dilated RV and normal systolic function, PASP 57 mmHg.  Lexiscan Cardiolite in 6/18 showed no significant perfusion defect.   He returns for hospital followup today.  He is in rate-controlled atrial fibrillation today.  He was in NSR at his stress test last week.  He is able to walk about 15 minutes on his treadmill without dyspnea, but gets short of breath if he walks briskly outside in the heat.  He does not feel palpitations.  No orthopnea/PND.  No  chest pain.    ECG: Personally reviewed.  Atrial fibrillation, inferolateral shallow TWIs, QTc 416 msec.   Labs (8/12): K 4.1, creatinine 1.2, LDL 91, HDL 53 Labs (8/14): K 4.6, creatinine 1.1 Labs (11/14): K 4.6, creatinine 1.2, LDL 91, HDL 56 Labs (3/15): AST 38, ALT 32, TSH normal, BNP 237 Labs (7/15): LDL 96, HDL 48, K 4.2, creatinine 1.2, TSH normal, BNP 237, AST 38, ALT 32 Labs (8/15): LFTs normal Labs (11/15): K 4.2, creatinine 1.1, Mg 2.2, BNP 227 Labs (2/16): K 4, creatinine 1.32, BNP 261 Labs (6/18): K 3.9, creatinine 1.6  PMH: 1. CAD: 1st MI in 1988.  CABG 1996.  PCI to CFX in 2009.  LHC (11/09) SVG-dRCA patent, total occlusion RCA, patent CFX stent, atretic LIMA, serial 60 and 80% proximal LAD stenoses, EF 60% with basal inferior hypokinesis.  Myoview in 2011 with no ischemia or infarction.  Echo (10/12) with EF 55-60%, mild LVH, mild MR.  Lexiscan Cardiolite in 2013 with no ischemia or infarction.  Echo (8/14) with EF 55-60%, moderate MR, moderate TR, PA systolic pressure 35 mmHg.  Echo (4/15) with EF 60-65%, mild focal basal septal hypertrophy, inferior basal akinesis, mild MR.  Lexiscan cardiolite (11/15) with EF 61%, fixed basal inferoseptal defect, no ischemia.  - Lexiscan Cardiolite (6/18): EF 54%, no perfusion defect.  2. Raynauds 3. Post-polio syndrome 4. GERD  with dilation of esophageal stricture in 12/12.  5. Hyperlipidemia 6. Brainstem stroke with Horner's syndrome.  7. Scoliosis. 8. H/o appendectomy 9. Herpes Zoster 10. Atrial fibrillation: DCCV to NSR in 8/14. DCCV to NSR in 1/15. Atrial fibrillation ablation 6/15 with Tikosyn loading (at [facility]).   11. PFTs (4/15) with FVC 59%, FEV1 54%, ratio 91%, DLCO 53% => moderate obstructive defect thought to be related to COPD and severe restrictive defect thought to be due to elevated left hemidiaphragm and post-polio syndrome.  12. Chronic diastolic CHF.  Echo (5/18) with EF 65-70%, RV moderately dilated with normal systolic  function, PASP 57 mmHg.  13. CKD: Stage 3.   SH: quit tobacco while in his 54s.  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   FH: Brother with CAD, heart transplant.   ROS: All systems reviewed and negative except as per HPI.   Current Outpatient Prescriptions  Medication Sig Dispense Refill  . acetaminophen (TYLENOL) 500 MG tablet Take 500 mg by mouth every 12 (twelve) hours.    [name] albuterol (PROAIR HFA) 108 (90 BASE) MCG/ACT inhaler Inhale 2 puffs into the lungs every 4 (four) hours as needed for wheezing or shortness of breath (and prior to exercise). 1 Inhaler 3  . AMBULATORY NON FORMULARY MEDICATION Testosterone cream 150mg /15% Place 15% onto the skin daily    . BYSTOLIC 10 MG tablet TAKE 1 TABLET BY MOUTH EVERY DAY 30 tablet 5  . cetirizine (ZYRTEC) 10 MG tablet Take 10 mg by mouth.    . diltiazem (CARDIZEM CD) 120 MG 24 hr capsule Take 1 capsule (120 mg total) by mouth daily. 90 capsule 3  . dofetilide (TIKOSYN) 250 MCG capsule TAKE 1 CAPSULE BY MOUTH EVERY 12 HOURS 180 capsule 1  . furosemide (LASIX) 40 MG tablet Take 20-40 mg by mouth as directed. Take 40mg  in the AM and 20mg  in the evening    . lactobacillus acidophilus & bulgar (LACTINEX) chewable tablet Chew 2 tablets by mouth daily.     [name] LORazepam (ATIVAN) 0.5 MG tablet Take 0.5 mg by mouth at bedtime as needed for sleep.     . methocarbamol (ROBAXIN) 500 MG tablet Take 500 mg by mouth as needed for muscle spasms.    . potassium chloride SA (KLOR-CON M20) 20 MEQ tablet Take 1 tablet (20 mEq total) by mouth 2 (two) times daily. 180 tablet 3  . ranitidine (ZANTAC) 300 MG tablet TAKE 1 TABLET BY MOUTH EVERY DAY 30 tablet 3  . rivaroxaban (XARELTO) 15 MG TABS tablet Take 1 tablet (15 mg total) by mouth every evening. 90 tablet 3  . rosuvastatin (CRESTOR) 40 MG tablet Take 40 mg by mouth every evening.    . traZODone (DESYREL) 50 MG tablet Take 50 mg by mouth at bedtime.    . Vitamin D, Ergocalciferol, (DRISDOL) 50000 UNITS CAPS  capsule Take 50,000 Units by mouth every 14 (fourteen) days. Take on 1st and 15th    . nitroGLYCERIN (NITROSTAT) 0.4 MG SL tablet Place 1 tablet (0.4 mg total) under the tongue every 5 (five) minutes x 3 doses as needed for chest pain. (Patient not taking: Reported on 06/13/2017) 25 tablet 3   No current facility-administered medications for this encounter.     BP 130/68  Pulse 66  Ht 5\' 6"  (1.676 m)  Wt 60.328 kg (133 lb)  BMI 21.47 kg/m2 General: NAD Neck: JVP 7-8 cm, no thyromegaly or thyroid nodule.  Lungs: Decreased breath sounds left base.  CV: Nondisplaced PMI.  Heart regular S1/S2, no S3/S4, no murmur.  No edema.  No carotid bruit.  Normal pedal pulses.  Abdomen: Soft, nontender, no hepatosplenomegaly, no distention.  Neurologic: Alert and oriented x 3.  Psych: Normal affect.  Assessment/Plan: 1. Atrial fibrillation: He has been on Tikosyn and is s/p atrial fibrillation ablation at [facility] on 06/25/14.  He is in atrial fibrillation today.  This is the first time atrial fibrillation has been noted since 2015.  He does not feel palpitations but still has exertional dyspnea.  - Continue Tikosyn.  QTc acceptable today.    - Continue Xarelto and nebivolol.  - I will repeat ECG in 2 days to see if atrial fibrillation is persistent.  If he remains in atrial fibrillation, will plan DCCV later this week.  If DCCV is successful but if atrial fibrillation recurs, would consider redo atrial fibrillation ablation.  2. CAD: He is not taking aspirin as he has stable CAD and is on Xarelto.  Continue nebivolol and Crestor.  Recent Cardiolite (6/18) showed no ischemia or infarction.   3. Hyperlipidemia: He is taking Crestor 40 mg daily.  Recent lipids done by PCP, will call for copy.   4. Chronic diastolic CHF: NYHA class II-III symptoms.  He does not appear volume overloaded on exam, but still has some exertional dyspnea. Atrial fibrillation may be playing a role in dyspnea.  - Continue Lasix 40 qam/20  qpm.   5. Pulmonary: PFTs suggested both a restrictive and obstructive defect.  The restrictive defect is likely from post-polio syndrome and elevated left hemidiaphragm.  Mr Rueter never smoked much, so the obstructive defect is more surprising.   Followup in 1 month    [name] 06/13/2017

## 2017-06-15 ENCOUNTER — Ambulatory Visit (HOSPITAL_COMMUNITY)
Admission: RE | Admit: 2017-06-15 | Discharge: 2017-06-15 | Disposition: A | Discharge status: Home / Self Care | Payer: Medicare Other | Source: Ambulatory Visit | Attending: Cardiology | Admitting: Cardiology

## 2017-06-15 ENCOUNTER — Encounter (HOSPITAL_COMMUNITY): Payer: Self-pay

## 2017-06-15 DIAGNOSIS — I4891 Unspecified atrial fibrillation: Secondary | ICD-10-CM | POA: Insufficient documentation

## 2017-06-15 MED ORDER — EZETIMIBE 10 MG PO TABS: 10.0000 mg | ORAL_TABLET | Freq: Every day | ORAL | 3 refills | Status: DC

## 2017-06-15 NOTE — Patient Instructions (Signed)
START taking Zetia 10 mg (1 Tablet) Once Daily

## 2017-06-16 NOTE — Progress Notes (Signed)
Patient came in for EKG to check and see if he was still in Afib.  EKG reviewed by [name] and patient in back in NSR.  Patient also brought lab work from another MD office, [name] made one medication change and patient is aware.  Patient will follow up in 1 month.

## 2017-06-24 ENCOUNTER — Other Ambulatory Visit (HOSPITAL_COMMUNITY): Payer: Self-pay | Admitting: Cardiology

## 2017-07-11 ENCOUNTER — Encounter (HOSPITAL_COMMUNITY): Payer: Self-pay | Admitting: Cardiology

## 2017-07-11 ENCOUNTER — Telehealth (HOSPITAL_COMMUNITY): Payer: Self-pay | Admitting: *Deleted

## 2017-07-11 ENCOUNTER — Ambulatory Visit (HOSPITAL_COMMUNITY)
Admission: RE | Admit: 2017-07-11 | Discharge: 2017-07-11 | Disposition: A | Discharge status: Home / Self Care | Payer: Medicare Other | Source: Ambulatory Visit | Attending: Cardiology | Admitting: Cardiology

## 2017-07-11 VITALS — BP 130/64 | HR 60 | Wt 127.0 lb

## 2017-07-11 DIAGNOSIS — Z79899 Other long term (current) drug therapy: Secondary | ICD-10-CM | POA: Diagnosis not present

## 2017-07-11 DIAGNOSIS — E785 Hyperlipidemia, unspecified: Secondary | ICD-10-CM | POA: Diagnosis not present

## 2017-07-11 DIAGNOSIS — I7300 Raynaud's syndrome without gangrene: Secondary | ICD-10-CM | POA: Insufficient documentation

## 2017-07-11 DIAGNOSIS — K529 Noninfective gastroenteritis and colitis, unspecified: Secondary | ICD-10-CM | POA: Insufficient documentation

## 2017-07-11 DIAGNOSIS — Z9889 Other specified postprocedural states: Secondary | ICD-10-CM | POA: Insufficient documentation

## 2017-07-11 DIAGNOSIS — I480 Paroxysmal atrial fibrillation: Secondary | ICD-10-CM | POA: Diagnosis not present

## 2017-07-11 DIAGNOSIS — Z951 Presence of aortocoronary bypass graft: Secondary | ICD-10-CM | POA: Diagnosis not present

## 2017-07-11 DIAGNOSIS — I5032 Chronic diastolic (congestive) heart failure: Secondary | ICD-10-CM

## 2017-07-11 DIAGNOSIS — Z7901 Long term (current) use of anticoagulants: Secondary | ICD-10-CM | POA: Diagnosis not present

## 2017-07-11 DIAGNOSIS — Z9049 Acquired absence of other specified parts of digestive tract: Secondary | ICD-10-CM | POA: Insufficient documentation

## 2017-07-11 DIAGNOSIS — Z87891 Personal history of nicotine dependence: Secondary | ICD-10-CM | POA: Diagnosis not present

## 2017-07-11 DIAGNOSIS — I2582 Chronic total occlusion of coronary artery: Secondary | ICD-10-CM | POA: Insufficient documentation

## 2017-07-11 DIAGNOSIS — I252 Old myocardial infarction: Secondary | ICD-10-CM | POA: Diagnosis not present

## 2017-07-11 DIAGNOSIS — Z8673 Personal history of transient ischemic attack (TIA), and cerebral infarction without residual deficits: Secondary | ICD-10-CM | POA: Insufficient documentation

## 2017-07-11 DIAGNOSIS — K219 Gastro-esophageal reflux disease without esophagitis: Secondary | ICD-10-CM | POA: Insufficient documentation

## 2017-07-11 DIAGNOSIS — R0609 Other forms of dyspnea: Secondary | ICD-10-CM | POA: Insufficient documentation

## 2017-07-11 DIAGNOSIS — I2510 Atherosclerotic heart disease of native coronary artery without angina pectoris: Secondary | ICD-10-CM | POA: Insufficient documentation

## 2017-07-11 DIAGNOSIS — E784 Other hyperlipidemia: Secondary | ICD-10-CM | POA: Diagnosis not present

## 2017-07-11 DIAGNOSIS — N183 Chronic kidney disease, stage 3 (moderate): Secondary | ICD-10-CM | POA: Diagnosis not present

## 2017-07-11 DIAGNOSIS — I4891 Unspecified atrial fibrillation: Secondary | ICD-10-CM | POA: Insufficient documentation

## 2017-07-11 DIAGNOSIS — M419 Scoliosis, unspecified: Secondary | ICD-10-CM | POA: Insufficient documentation

## 2017-07-11 DIAGNOSIS — E7849 Other hyperlipidemia: Secondary | ICD-10-CM

## 2017-07-11 LAB — BASIC METABOLIC PANEL
Anion gap: 6 (ref 5–15)
BUN: 15 mg/dL (ref 6–20)
CALCIUM: 9.9 mg/dL (ref 8.9–10.3)
CO2: 28 mmol/L (ref 22–32)
CREATININE: 1.54 mg/dL — AB (ref 0.61–1.24)
Chloride: 100 mmol/L — ABNORMAL LOW (ref 101–111)
GFR calc Af Amer: 48 mL/min — ABNORMAL LOW (ref >60)
GFR, EST NON AFRICAN AMERICAN: 41 mL/min — AB (ref >60)
GLUCOSE: 90 mg/dL (ref 65–99)
Potassium: 3.9 mmol/L (ref 3.5–5.1)
Sodium: 134 mmol/L — ABNORMAL LOW (ref 135–145)

## 2017-07-11 LAB — MAGNESIUM: MAGNESIUM: 2.1 mg/dL (ref 1.7–2.4)

## 2017-07-11 LAB — CBC
HCT: 44.3 % (ref 39.0–52.0)
Hemoglobin: 15 g/dL (ref 13.0–17.0)
MCH: 33.3 pg (ref 26.0–34.0)
MCHC: 33.9 g/dL (ref 30.0–36.0)
MCV: 98.4 fL (ref 78.0–100.0)
Platelets: 161 10*3/uL (ref 150–400)
RBC: 4.5 MIL/uL (ref 4.22–5.81)
RDW: 13.7 % (ref 11.5–15.5)
WBC: 7 10*3/uL (ref 4.0–10.5)

## 2017-07-11 LAB — BRAIN NATRIURETIC PEPTIDE: B Natriuretic Peptide: 241.6 pg/mL — ABNORMAL HIGH (ref 0.0–100.0)

## 2017-07-11 NOTE — Patient Instructions (Signed)
Restart Zetia 10 mg daily  Labs today  Your physician recommends that you return for a FASTING lipid profile: 2 months  Please call Dr Fuller Plan and make a follow up appointment  We will contact you in 4 months to schedule your next appointment.

## 2017-07-11 NOTE — Telephone Encounter (Signed)
Copy of pt's labs faxed to him at [place] at [phone] per his request

## 2017-07-11 NOTE — Progress Notes (Signed)
Patient ID: [name], male   DOB: [birth year], 80 y.o.   MRN: [id number] PCP: [name] Cardiology: [name]  81 yo with history of CAD s/p CABG presents for cardiology followup.  His last cath was in 11/09.  The SVG-distal RCA was patent, the CFX system was patent.  His LIMA was atretic and there were serial 60% and 80% stenoses in the native LAD.  He was managed medically.  Echo in 8/14 showed EF 55-60% with moderate MR and moderate TR.   He was initially noted to have atrial fibrillation in the summer of 2014. He was started on Xarelto and cardioverted to NSR in 8/14.  Recurrent atrial fibrillation was noted in 1/15, and he was cardioverted to NSR again.  This time, NSR did not hold long. By 5/15, he was in persistent atrial fibrillation.  I referred him to [facility] where he had atrial fibrillation ablation and Tikosyn loading.  Ranolazine was stopped due to risk of QT prolongation and Imdur was started as an anti-anginal instead.  Lexiscan Cardiolite in 11/15 showed no ischemia.    CTA chest done in 2/16 to look for evidence for PV stenosis post-AF ablation.  This showed mild short-segment narrowing of the left inferior pulmonary vein.   Patient was admitted in 5/18 with acute on chronic diastolic CHF.  He had been at the beach for several days and ate out a number of times, probably getting a significant sodium load.  No chest pain.  He was in normal sinus rhythm.  He was started on IV Lasix and diuresed.  Echo in 5/18 showed EF 65-70% with moderately dilated RV and normal systolic function, PASP 57 mmHg.  Lexiscan Cardiolite in 6/18 showed no significant perfusion defect.   At last appointment in 6/18, he was in rate-controlled atrial fibrillation.  Today, he is back in NSR.  No dyspnea walking on flat ground.  He is short of breath walking up an incline.  He does not feel palpitations.  No orthopnea/PND.  No chest pain.  Main complaint currently is diarrhea (watery stools) 6+ episodes a day for  several months now.  He stopped Zetia thinking this might be causing diarrhea but did not help.  No blood noted in stool.  No exposure to river/pond water or well water.  Wife has not had these symptoms. Weight is down 6 lbs.   ECG: Personally reviewed.  NSR, inferior T wave flattening, QTc ok.   Labs (8/12): K 4.1, creatinine 1.2, LDL 91, HDL 53 Labs (8/14): K 4.6, creatinine 1.1 Labs (11/14): K 4.6, creatinine 1.2, LDL 91, HDL 56 Labs (3/15): AST 38, ALT 32, TSH normal, BNP 237 Labs (7/15): LDL 96, HDL 48, K 4.2, creatinine 1.2, TSH normal, BNP 237, AST 38, ALT 32 Labs (8/15): LFTs normal Labs (11/15): K 4.2, creatinine 1.1, Mg 2.2, BNP 227 Labs (2/16): K 4, creatinine 1.32, BNP 261 Labs (6/18): K 3.9, creatinine 1.6  PMH: 1. CAD: 1st MI in 1988.  CABG 1996.  PCI to CFX in 2009.  LHC (11/09) SVG-dRCA patent, total occlusion RCA, patent CFX stent, atretic LIMA, serial 60 and 80% proximal LAD stenoses, EF 60% with basal inferior hypokinesis.  Myoview in 2011 with no ischemia or infarction.  Echo (10/12) with EF 55-60%, mild LVH, mild MR.  Lexiscan Cardiolite in 2013 with no ischemia or infarction.  Echo (8/14) with EF 55-60%, moderate MR, moderate TR, PA systolic pressure 35 mmHg.  Echo (4/15) with EF 60-65%, mild focal basal  septal hypertrophy, inferior basal akinesis, mild MR.  Lexiscan cardiolite (11/15) with EF 61%, fixed basal inferoseptal defect, no ischemia.  - Lexiscan Cardiolite (6/18): EF 54%, no perfusion defect.  2. Raynauds 3. Post-polio syndrome 4. GERD with dilation of esophageal stricture in 12/12.  5. Hyperlipidemia 6. Brainstem stroke with Horner's syndrome.  7. Scoliosis. 8. H/o appendectomy 9. Herpes Zoster 10. Atrial fibrillation: DCCV to NSR in 8/14. DCCV to NSR in 1/15. Atrial fibrillation ablation 6/15 with Tikosyn loading (at [facility]).   11. PFTs (4/15) with FVC 59%, FEV1 54%, ratio 91%, DLCO 53% => moderate obstructive defect thought to be related to COPD and severe  restrictive defect thought to be due to elevated left hemidiaphragm and post-polio syndrome.  12. Chronic diastolic CHF.  Echo (5/18) with EF 65-70%, RV moderately dilated with normal systolic function, PASP 57 mmHg.  13. CKD: Stage 3.   SH: quit tobacco while in his 1s.  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   ROS: All systems reviewed and negative except as per HPI.   Current Outpatient Prescriptions  Medication Sig Dispense Refill  . acetaminophen (TYLENOL) 500 MG tablet Take 500 mg by mouth every 12 (twelve) hours.    [name] albuterol (PROAIR HFA) 108 (90 BASE) MCG/ACT inhaler Inhale 2 puffs into the lungs every 4 (four) hours as needed for wheezing or shortness of breath (and prior to exercise). 1 Inhaler 3  . AMBULATORY NON FORMULARY MEDICATION Testosterone cream 150mg /15% Place 15% onto the skin daily    . BYSTOLIC 10 MG tablet TAKE 1 TABLET BY MOUTH EVERY DAY 30 tablet 5  . cetirizine (ZYRTEC) 10 MG tablet Take 10 mg by mouth.    . dofetilide (TIKOSYN) 250 MCG capsule TAKE 1 CAPSULE BY MOUTH EVERY 12 HOURS 180 capsule 1  . furosemide (LASIX) 40 MG tablet Take 20-40 mg by mouth as directed. Take 40mg  in the AM and 20mg  in the evening    . lactobacillus acidophilus & bulgar (LACTINEX) chewable tablet Chew 2 tablets by mouth daily.     [name] LORazepam (ATIVAN) 0.5 MG tablet Take 0.5 mg by mouth at bedtime as needed for sleep.     . methocarbamol (ROBAXIN) 500 MG tablet Take 500 mg by mouth as needed for muscle spasms.    . potassium chloride SA (KLOR-CON M20) 20 MEQ tablet Take 1 tablet (20 mEq total) by mouth 2 (two) times daily. 180 tablet 3  . ranitidine (ZANTAC) 300 MG tablet TAKE 1 TABLET BY MOUTH EVERY DAY 30 tablet 3  . rivaroxaban (XARELTO) 15 MG TABS tablet Take 1 tablet (15 mg total) by mouth every evening. 90 tablet 3  . rosuvastatin (CRESTOR) 40 MG tablet Take 40 mg by mouth every evening.    . traZODone (DESYREL) 50 MG tablet Take  50 mg by mouth at bedtime.    . Vitamin D, Ergocalciferol, (DRISDOL) 50000 UNITS CAPS capsule Take 50,000 Units by mouth every 14 (fourteen) days. Take on 1st and 15th    . diltiazem (CARDIZEM CD) 120 MG 24 hr capsule Take 1 capsule (120 mg total) by mouth daily. 90 capsule 3   No current facility-administered medications for this encounter.     BP 130/68  Pulse 66  Ht 5\' 6"  (1.676 m)  Wt 60.328 kg (133 lb)  BMI 21.47 kg/m2 General: NAD Neck: JVP 8 cm, no thyromegaly or thyroid nodule.  Lungs: Decreased breath sounds left base.  CV: Nondisplaced PMI.  Heart regular S1/S2, no S3/S4, no murmur.  No peripheral edema.  No carotid bruit.  Normal pedal pulses.  Abdomen: Soft, nontender, no hepatosplenomegaly, no distention.  Neurologic: Alert and oriented x 3.  Psych: Normal affect.  Assessment/Plan: 1. Atrial fibrillation: He has been on Tikosyn and is s/p atrial fibrillation ablation at [facility] on 06/25/14.  He is in NSR today.   - Continue Tikosyn.  QTc acceptable today.    - Continue Xarelto and nebivolol.  - Check CBC given Xarelto use.  - If he has frequent breakthrough episodes of symptomatic atrial fibrillation, could consider redo ablation.  2. CAD: He is not taking aspirin as he has stable CAD and is on Xarelto.  Continue nebivolol and Crestor.  Recent Cardiolite (6/18) showed no ischemia or infarction.   3. Hyperlipidemia: He is taking Crestor 40 mg daily.  LDL > 70 when checked last by PCP.  - Would restart Zetia, do not think this is causing his diarrhea.  Will need to recheck lipids in 2 months.   4. Chronic diastolic CHF: NYHA class II-III symptoms.  He does not appear significantly volume overloaded on exam, but still has some exertional dyspnea.  - Continue Lasix 40 qam/20 qpm.   - BMET/BNP today.  5. Pulmonary: PFTs suggested both a restrictive and obstructive defect.  The restrictive defect is likely from post-polio syndrome and elevated left hemidiaphragm.  Mr Basu never  smoked much, so the obstructive defect is more surprising.  6. Chronic diarrhea: x several months, multiple watery episodes/day. No hematochezia noted and no definite exposure to pond or stream water.  I think he needs an appointment with GI for workup.  He has seen Dr Fuller Plan, will call for appointment.   Followup in 4 months    [name] 07/11/2017

## 2017-08-11 ENCOUNTER — Other Ambulatory Visit: Payer: Medicare Other

## 2017-08-25 DIAGNOSIS — L814 Other melanin hyperpigmentation: Secondary | ICD-10-CM | POA: Diagnosis not present

## 2017-08-25 DIAGNOSIS — D225 Melanocytic nevi of trunk: Secondary | ICD-10-CM | POA: Diagnosis not present

## 2017-08-25 DIAGNOSIS — Z85828 Personal history of other malignant neoplasm of skin: Secondary | ICD-10-CM | POA: Diagnosis not present

## 2017-08-25 DIAGNOSIS — L821 Other seborrheic keratosis: Secondary | ICD-10-CM | POA: Diagnosis not present

## 2017-08-25 DIAGNOSIS — L819 Disorder of pigmentation, unspecified: Secondary | ICD-10-CM | POA: Diagnosis not present

## 2017-08-31 ENCOUNTER — Other Ambulatory Visit: Payer: Self-pay | Admitting: Nurse Practitioner

## 2017-09-01 ENCOUNTER — Other Ambulatory Visit: Payer: Self-pay | Admitting: Nurse Practitioner

## 2017-09-14 ENCOUNTER — Ambulatory Visit: Payer: Medicare Other | Admitting: Nurse Practitioner

## 2017-09-21 DIAGNOSIS — I8390 Asymptomatic varicose veins of unspecified lower extremity: Secondary | ICD-10-CM | POA: Diagnosis not present

## 2017-09-21 DIAGNOSIS — I480 Paroxysmal atrial fibrillation: Secondary | ICD-10-CM | POA: Diagnosis not present

## 2017-09-21 DIAGNOSIS — Z23 Encounter for immunization: Secondary | ICD-10-CM | POA: Diagnosis not present

## 2017-09-21 DIAGNOSIS — M859 Disorder of bone density and structure, unspecified: Secondary | ICD-10-CM | POA: Diagnosis not present

## 2017-09-21 DIAGNOSIS — E784 Other hyperlipidemia: Secondary | ICD-10-CM | POA: Diagnosis not present

## 2017-09-21 DIAGNOSIS — R634 Abnormal weight loss: Secondary | ICD-10-CM | POA: Diagnosis not present

## 2017-09-21 DIAGNOSIS — E291 Testicular hypofunction: Secondary | ICD-10-CM | POA: Diagnosis not present

## 2017-09-21 DIAGNOSIS — I509 Heart failure, unspecified: Secondary | ICD-10-CM | POA: Diagnosis not present

## 2017-09-21 DIAGNOSIS — N183 Chronic kidney disease, stage 3 (moderate): Secondary | ICD-10-CM | POA: Diagnosis not present

## 2017-09-21 DIAGNOSIS — Z681 Body mass index (BMI) 19 or less, adult: Secondary | ICD-10-CM | POA: Diagnosis not present

## 2017-09-21 DIAGNOSIS — Z1321 Encounter for screening for nutritional disorder: Secondary | ICD-10-CM | POA: Diagnosis not present

## 2017-09-21 DIAGNOSIS — I10 Essential (primary) hypertension: Secondary | ICD-10-CM | POA: Diagnosis not present

## 2017-10-03 ENCOUNTER — Ambulatory Visit (INDEPENDENT_AMBULATORY_CARE_PROVIDER_SITE_OTHER)
Admission: RE | Admit: 2017-10-03 | Discharge: 2017-10-03 | Disposition: A | Discharge status: Home / Self Care | Payer: Medicare Other | Source: Ambulatory Visit | Attending: Gastroenterology | Admitting: Gastroenterology

## 2017-10-03 ENCOUNTER — Ambulatory Visit (INDEPENDENT_AMBULATORY_CARE_PROVIDER_SITE_OTHER): Payer: Medicare Other | Admitting: Gastroenterology

## 2017-10-03 ENCOUNTER — Encounter: Payer: Self-pay | Admitting: Gastroenterology

## 2017-10-03 ENCOUNTER — Telehealth: Payer: Self-pay

## 2017-10-03 ENCOUNTER — Encounter (INDEPENDENT_AMBULATORY_CARE_PROVIDER_SITE_OTHER): Payer: Self-pay

## 2017-10-03 ENCOUNTER — Other Ambulatory Visit (INDEPENDENT_AMBULATORY_CARE_PROVIDER_SITE_OTHER): Payer: Medicare Other

## 2017-10-03 VITALS — BP 118/62 | Ht 66.0 in | Wt 125.0 lb

## 2017-10-03 DIAGNOSIS — I2510 Atherosclerotic heart disease of native coronary artery without angina pectoris: Secondary | ICD-10-CM

## 2017-10-03 DIAGNOSIS — K219 Gastro-esophageal reflux disease without esophagitis: Secondary | ICD-10-CM

## 2017-10-03 DIAGNOSIS — R1310 Dysphagia, unspecified: Secondary | ICD-10-CM | POA: Diagnosis not present

## 2017-10-03 DIAGNOSIS — R0689 Other abnormalities of breathing: Secondary | ICD-10-CM | POA: Diagnosis not present

## 2017-10-03 DIAGNOSIS — R197 Diarrhea, unspecified: Secondary | ICD-10-CM | POA: Diagnosis not present

## 2017-10-03 DIAGNOSIS — R634 Abnormal weight loss: Secondary | ICD-10-CM

## 2017-10-03 DIAGNOSIS — Z86010 Personal history of colonic polyps: Secondary | ICD-10-CM

## 2017-10-03 DIAGNOSIS — R05 Cough: Secondary | ICD-10-CM | POA: Diagnosis not present

## 2017-10-03 LAB — IGA: IgA: 153 mg/dL (ref 68–378)

## 2017-10-03 LAB — TSH: TSH: 1.99 u[IU]/mL (ref 0.35–4.50)

## 2017-10-03 MED ORDER — NA SULFATE-K SULFATE-MG SULF 17.5-3.13-1.6 GM/177ML PO SOLN: 1.0000 | Freq: Once | ORAL | 0 refills | Status: AC

## 2017-10-03 NOTE — Progress Notes (Signed)
History of Present Illness: This is a 81 year old male with weight loss, diarrhea and a personal history of adenomatous colon polyps. He is accompanied by his wife. Colonoscopy was discussed in March 2018 and he was reluctant to schedule and stated he would call back if he decided to proceed. He was having occasional mild diarrhea at that time. Since then his diarrhea has substantially worsened, occurring several days per week. His normal bowel pattern is 2-3 soft, semi formed stools per day and on several days per week he has several more loose and watery stools without bleeding. He is not sure what triggers his diarrhea. He also notes worsening solid food dysphagia over the past several months with occasional regurgitation. He underwent EGD with dilation in 2012 here and in 2015 at [facility]. Dr. Joya Salm documented a 10 pound weight loss. He states he typically misses midday meals when working. This has been his practice for many years - it has not changed. Denies abdominal pain, constipation, change in stool caliber, melena, hematochezia, nausea, vomiting, chest pain.   Allergies  Allergen Reactions  . Morphine And Related Other (See Comments)    IV forms- vein irritation IV forms- vein irritation   Outpatient Medications Prior to Visit  Medication Sig Dispense Refill  . acetaminophen (TYLENOL) 500 MG tablet Take 500 mg by mouth every 12 (twelve) hours.    [name] albuterol (PROAIR HFA) 108 (90 BASE) MCG/ACT inhaler Inhale 2 puffs into the lungs every 4 (four) hours as needed for wheezing or shortness of breath (and prior to exercise). 1 Inhaler 3  . AMBULATORY NON FORMULARY MEDICATION Testosterone cream 150mg /15% Place 15% onto the skin daily    . BYSTOLIC 10 MG tablet TAKE 1 TABLET BY MOUTH EVERY DAY 30 tablet 9  . cetirizine (ZYRTEC) 10 MG tablet Take 10 mg by mouth.    . dofetilide (TIKOSYN) 250 MCG capsule TAKE 1 CAPSULE BY MOUTH EVERY 12 HOURS 180 capsule 1  . furosemide (LASIX) 40 MG tablet  Take 20-40 mg by mouth as directed. Take 40mg  in the AM and 20mg  in the evening    . lactobacillus acidophilus & bulgar (LACTINEX) chewable tablet Chew 2 tablets by mouth daily.     [name] LORazepam (ATIVAN) 0.5 MG tablet Take 0.5 mg by mouth at bedtime as needed for sleep.     . methocarbamol (ROBAXIN) 500 MG tablet Take 500 mg by mouth as needed for muscle spasms.    . potassium chloride SA (KLOR-CON M20) 20 MEQ tablet Take 1 tablet (20 mEq total) by mouth 2 (two) times daily. 180 tablet 3  . ranitidine (ZANTAC) 300 MG tablet TAKE 1 TABLET BY MOUTH EVERY DAY 30 tablet 3  . rivaroxaban (XARELTO) 15 MG TABS tablet Take 1 tablet (15 mg total) by mouth every evening. 90 tablet 3  . rosuvastatin (CRESTOR) 40 MG tablet Take 40 mg by mouth every evening.    . traZODone (DESYREL) 50 MG tablet Take 50 mg by mouth at bedtime.    . Vitamin D, Ergocalciferol, (DRISDOL) 50000 UNITS CAPS capsule Take 50,000 Units by mouth every 14 (fourteen) days. Take on 1st and 15th    . diltiazem (CARDIZEM CD) 120 MG 24 hr capsule Take 1 capsule (120 mg total) by mouth daily. 90 capsule 3   No facility-administered medications prior to visit.    Past Medical History:  Diagnosis Date  . Abnormal nuclear cardiac imaging test March 2011   Has positive EKG response, no perfusion defect and  normal EF  . Adenomatous colon polyp 12/2002  . Atrial fibrillation ([place])    a. s/p rfca;  b. chronic tikosyn and xarelto.  . 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 Chronic diastolic CHF (congestive heart failure) ([place])    a. 04/2017 Echo: EF 65-70%, restrictive physiology, mildly dil LA.  [name] COPD (chronic obstructive pulmonary disease) ([place])   . Coronary artery disease    First MI in 1988. PCI in 1996 with subsequent CABG in 1996 due to restenosis. S/P stents to LCX in 2009. Noted to have residual disease in the LAD in a diffuse manner and atretic LIMA graft. He is managed medically.   . Esophageal stricture   .  GERD (gastroesophageal reflux disease)   . 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 History of Raynaud's syndrome   . Hyperlipidemia   . Internal hemorrhoids   . Persistent atrial fibrillation ([place])   . Polio    age 60  . PVC (premature ventricular contraction)   . Scoliosis    mild   Past Surgical History:  Procedure Laterality Date  . APPENDECTOMY     Age 23  . CARDIOVERSION N/A 08/22/2013   Procedure: CARDIOVERSION;  Surgeon: Carlena Bjornstad, MD;  Location: [facility] ENDOSCOPY;  Service: Cardiovascular;  Laterality: N/A;  . CARDIOVERSION N/A 01/16/2014   Procedure: CARDIOVERSION;  Surgeon: Lelon Perla, MD;  Location: [facility] ENDOSCOPY;  Service: Cardiovascular;  Laterality: N/A;  . COLONOSCOPY  2008  . CORONARY ARTERY BYPASS GRAFT  1996   with a LIMA to the LAD, SVG to RCA and SVG to OM  . CORONARY STENT PLACEMENT  2009    LCX  . TONSILLECTOMY     Social History   Social History  . Marital status: Married    Spouse name: N/A  . Number of children: 2  . Years of education: N/A   Occupational History  . Pharmacist    Social History Main Topics  . Smoking status: Former Smoker    Packs/day: 0.50    Years: 5.00    Types: Cigarettes    Quit date: 12/27/1966  . Smokeless tobacco: Never Used  . Alcohol use No  . Drug use: No  . Sexual activity: Yes   Other Topics Concern  . 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 Heart disease Mother   . Heart disease Father   . Heart Problems Brother        heart transplant  . Heart disease Maternal Grandmother   . Heart disease Maternal Grandfather   . Heart disease Paternal Grandmother   . Heart disease Paternal Grandfather   . Prostate cancer Paternal Uncle   . Colon cancer Neg Hx      Physical Exam: General: Well developed, well nourished, thin, no acute distress Head: Normocephalic and  atraumatic Eyes:  sclerae anicteric, EOMI Ears: Normal auditory acuity Mouth: No deformity or lesions Lungs: Decreased breath sounds left base, otherwise clear throughout to auscultation Heart: Regular rate and rhythm; no murmurs, rubs or bruits Abdomen: Soft, non tender and non distended. No masses, hepatosplenomegaly or hernias noted. Normal Bowel sounds Rectal: deferred to colonoscopy Musculoskeletal: Symmetrical with no gross deformities  Pulses:  Normal pulses noted Extremities: No clubbing, cyanosis, edema or deformities noted Neurological: Alert oriented  x 4, grossly nonfocal Psychological:  Alert and cooperative. Normal mood and affect  Assessment and Recommendations:  1. Weight loss, worsening diarrhea, dysphagia, GERD, personal history of adenomatous colon polyps. He is encouraged not to skip any meals even if he just has Ensure or similar product or an energy bar. Continue ranitidine 300 mg daily and Imodium 3 times a day when necessary. Stool for GI pathogen panel and fecal elastase. TSH, tTG and IgA today. Schedule chest x-ray and an abdominal/ pelvic CT. Schedule colonoscopy and EGD with possible dilation. The risks (including bleeding, perforation, infection, missed lesions, medication reactions and possible hospitalization or surgery if complications occur), benefits, and alternatives to endoscopy with possible biopsy and possible dilation were discussed with the patient and they consent to proceed. The risks (including bleeding, perforation, infection, missed lesions, medication reactions and possible hospitalization or surgery if complications occur), benefits, and alternatives to colonoscopy with possible biopsy and possible polypectomy were discussed with the patient and they consent to proceed.   2. Hold Xarelto for 2 days before procedure - will instruct when and how to resume after procedure. Low but real risk of cardiovascular event such as heart attack, stroke, embolism,  thrombosis or ischemia/infarct of other organs off Xarelto explained and need to seek urgent help if this occurs. The patient consents to proceed. Will communicate by phone or EMR with patient's prescribing provider to confirm that holding Xarelto is reasonable in this case.   3. Chronic diastolic CHF. EF in 05/2017 was 45-54%. Stable symptoms. Followed by [name].  4. History of afib, stable.   5. CAD, stable.

## 2017-10-03 NOTE — Patient Instructions (Addendum)
Your physician has requested that you go to the basement for lab work before leaving today.  Please go directly to the x-ray department after you visit the lab in the basement.   You have been scheduled for a CT scan of the abdomen and pelvis at [facility] ([address]---this is in the same building as [facility]).   You are scheduled on 10/06/17 at 2:30pm. You should arrive 15 minutes prior to your appointment time for registration. Please follow the written instructions below on the day of your exam:  WARNING: IF YOU ARE ALLERGIC TO IODINE/X-RAY DYE, PLEASE NOTIFY RADIOLOGY IMMEDIATELY AT [phone]! YOU WILL BE GIVEN A 13 HOUR PREMEDICATION PREP.  1) Do not eat or drink anything after 10:30am (4 hours prior to your test) 2) You have been given 2 bottles of oral contrast to drink. The solution may taste               better if refrigerated, but do NOT add ice or any other liquid to this solution. Shake             well before drinking.    Drink 1 bottle of contrast @ 12:30pm (2 hours prior to your exam)  Drink 1 bottle of contrast @ 1:30pm (1 hour prior to your exam)  You may take any medications as prescribed with a small amount of water except for the following: Metformin, Glucophage, Glucovance, Avandamet, Riomet, Fortamet, Actoplus Met, Janumet, Glumetza or Metaglip. The above medications must be held the day of the exam AND 48 hours after the exam.  The purpose of you drinking the oral contrast is to aid in the visualization of your intestinal tract. The contrast solution may cause some diarrhea. Before your exam is started, you will be given a small amount of fluid to drink. Depending on your individual set of symptoms, you may also receive an intravenous injection of x-ray contrast/dye. Plan on being at [facility] for 30 minutes or longer, depending on the type of exam you are having performed.  This test typically takes 30-45 minutes to  complete.  If you have any questions regarding your exam or if you need to reschedule, you may call the CT department at [phone] between the hours of 8:00 am and 5:00 pm, Monday-Friday.  ________________________________________________________________________   Dennis Bast have been scheduled for an endoscopy and colonoscopy. Please follow the written instructions given to you at your visit today. Please pick up your prep supplies at the pharmacy within the next 1-3 days. If you use inhalers (even only as needed), please bring them with you on the day of your procedure. Your physician has requested that you go to www.startemmi.com and enter the access code given to you at your visit today. This web site gives a general overview about your procedure. However, you should still follow specific instructions given to you by our office regarding your preparation for the procedure.  Please drink Ensure in between meals to help you gain weight.    Thank you for choosing me and [facility].  Pricilla Riffle. Dagoberto Ligas., MD., Marval Regal

## 2017-10-03 NOTE — Telephone Encounter (Signed)
   [name] September 25, 1936 [id number]  Dear Tommy Medal, RPH-CPP:  We have scheduled the above named patient for a(n) Colonoscopy procedure. Our records show that (s)he is on anticoagulation therapy.  Please advise as to whether the patient may come off their therapy of Xarelto 2 days prior to their procedure which is scheduled for 11/08/17.  Please route your response to Marlon Pel, CMA or fax response to [phone].  Sincerely,    [facility]

## 2017-10-04 LAB — TISSUE TRANSGLUTAMINASE, IGA: (TTG) AB, IGA: 1 U/mL

## 2017-10-04 NOTE — Telephone Encounter (Signed)
Patient is on Xarelto therapy. Can please advise if patient can hold Xarelto prior to Colonoscopy.

## 2017-10-04 NOTE — Telephone Encounter (Deleted)
Patient with diagnosis of aortic valve replacement and AF on warfarin for anticoagulation.    Procedure: colonoscopy Date of procedure: Nov 13  CHADS2-VASc score of  1 (HTN)  CrCl 148 Platelet count 261  Per office protocol, patient can hold warfarin for 5 days prior to procedure.   Patient will not need bridging with Lovenox (enoxaparin) around procedure.  patient should restart warfarin on the evening of procedure or day after, at discretion of procedure MD

## 2017-10-04 NOTE — Telephone Encounter (Signed)
Sorry, was working on two patients for clearance - please disregard earlier warfarin information.    Reviewed protocol, ok to hold Xarelto x 2 days for colonoscopy

## 2017-10-05 ENCOUNTER — Other Ambulatory Visit: Payer: Medicare Other

## 2017-10-05 DIAGNOSIS — R197 Diarrhea, unspecified: Secondary | ICD-10-CM | POA: Diagnosis not present

## 2017-10-05 DIAGNOSIS — R634 Abnormal weight loss: Secondary | ICD-10-CM

## 2017-10-05 NOTE — Telephone Encounter (Signed)
Left a message for patient to return my call. 

## 2017-10-05 NOTE — Telephone Encounter (Signed)
Informed patient to hold Xarelto 2 days prior to his procedure which is not including procedure day and patient verbalized understanding.

## 2017-10-06 ENCOUNTER — Ambulatory Visit (INDEPENDENT_AMBULATORY_CARE_PROVIDER_SITE_OTHER)
Admission: RE | Admit: 2017-10-06 | Discharge: 2017-10-06 | Disposition: A | Discharge status: Home / Self Care | Payer: Medicare Other | Source: Ambulatory Visit | Attending: Gastroenterology | Admitting: Gastroenterology

## 2017-10-06 DIAGNOSIS — R197 Diarrhea, unspecified: Secondary | ICD-10-CM | POA: Diagnosis not present

## 2017-10-06 DIAGNOSIS — R634 Abnormal weight loss: Secondary | ICD-10-CM | POA: Diagnosis not present

## 2017-10-06 LAB — GASTROINTESTINAL PATHOGEN PANEL PCR
C. difficile Tox A/B, PCR: NOT DETECTED
Campylobacter, PCR: NOT DETECTED
Cryptosporidium, PCR: NOT DETECTED
E coli (ETEC) LT/ST PCR: NOT DETECTED
E coli (STEC) stx1/stx2, PCR: NOT DETECTED
E coli 0157, PCR: NOT DETECTED
Giardia lamblia, PCR: NOT DETECTED
Norovirus, PCR: NOT DETECTED
Rotavirus A, PCR: NOT DETECTED
Salmonella, PCR: NOT DETECTED
Shigella, PCR: NOT DETECTED

## 2017-10-06 MED ORDER — IOPAMIDOL (ISOVUE-300) INJECTION 61%
80.0000 mL | Freq: Once | INTRAVENOUS | Status: AC | PRN
  Administered 2017-10-06: 80 mL via INTRAVENOUS

## 2017-10-10 ENCOUNTER — Other Ambulatory Visit: Payer: Self-pay

## 2017-10-10 DIAGNOSIS — K869 Disease of pancreas, unspecified: Secondary | ICD-10-CM

## 2017-10-10 NOTE — Progress Notes (Signed)
Mri abd

## 2017-10-13 LAB — PANCREATIC ELASTASE, FECAL: Pancreatic Elastase-1, Stool: 323 mcg/g

## 2017-10-17 ENCOUNTER — Ambulatory Visit (HOSPITAL_COMMUNITY)
Admission: RE | Admit: 2017-10-17 | Discharge: 2017-10-17 | Disposition: A | Discharge status: Home / Self Care | Payer: Medicare Other | Source: Ambulatory Visit | Attending: Gastroenterology | Admitting: Gastroenterology

## 2017-10-17 ENCOUNTER — Other Ambulatory Visit: Payer: Self-pay | Admitting: Gastroenterology

## 2017-10-17 DIAGNOSIS — K869 Disease of pancreas, unspecified: Secondary | ICD-10-CM

## 2017-10-17 DIAGNOSIS — K862 Cyst of pancreas: Secondary | ICD-10-CM | POA: Diagnosis not present

## 2017-10-17 DIAGNOSIS — R932 Abnormal findings on diagnostic imaging of liver and biliary tract: Secondary | ICD-10-CM | POA: Diagnosis not present

## 2017-10-17 LAB — POCT I-STAT CREATININE: CREATININE: 1.4 mg/dL — AB (ref 0.61–1.24)

## 2017-10-17 MED ORDER — GADOBENATE DIMEGLUMINE 529 MG/ML IV SOLN
20.0000 mL | Freq: Once | INTRAVENOUS | Status: AC | PRN
  Administered 2017-10-17: 11 mL via INTRAVENOUS

## 2017-10-25 DIAGNOSIS — H52229 Regular astigmatism, unspecified eye: Secondary | ICD-10-CM | POA: Diagnosis not present

## 2017-10-25 DIAGNOSIS — H524 Presbyopia: Secondary | ICD-10-CM | POA: Diagnosis not present

## 2017-10-25 DIAGNOSIS — H5231 Anisometropia: Secondary | ICD-10-CM | POA: Diagnosis not present

## 2017-10-25 DIAGNOSIS — H04123 Dry eye syndrome of bilateral lacrimal glands: Secondary | ICD-10-CM | POA: Diagnosis not present

## 2017-10-25 DIAGNOSIS — H5210 Myopia, unspecified eye: Secondary | ICD-10-CM | POA: Diagnosis not present

## 2017-10-30 ENCOUNTER — Other Ambulatory Visit (HOSPITAL_COMMUNITY): Payer: Self-pay | Admitting: Cardiology

## 2017-11-08 ENCOUNTER — Ambulatory Visit (AMBULATORY_SURGERY_CENTER): Payer: Medicare Other | Admitting: Gastroenterology

## 2017-11-08 ENCOUNTER — Encounter: Payer: Self-pay | Admitting: Gastroenterology

## 2017-11-08 VITALS — BP 100/68 | HR 74 | Temp 98.9°F | Resp 15 | Ht 66.0 in | Wt 125.0 lb

## 2017-11-08 DIAGNOSIS — R197 Diarrhea, unspecified: Secondary | ICD-10-CM

## 2017-11-08 DIAGNOSIS — K222 Esophageal obstruction: Secondary | ICD-10-CM | POA: Diagnosis not present

## 2017-11-08 DIAGNOSIS — R1310 Dysphagia, unspecified: Secondary | ICD-10-CM | POA: Diagnosis not present

## 2017-11-08 DIAGNOSIS — R634 Abnormal weight loss: Secondary | ICD-10-CM | POA: Diagnosis present

## 2017-11-08 DIAGNOSIS — D125 Benign neoplasm of sigmoid colon: Secondary | ICD-10-CM

## 2017-11-08 DIAGNOSIS — K2990 Gastroduodenitis, unspecified, without bleeding: Secondary | ICD-10-CM | POA: Diagnosis not present

## 2017-11-08 DIAGNOSIS — K635 Polyp of colon: Secondary | ICD-10-CM

## 2017-11-08 DIAGNOSIS — Z86010 Personal history of colonic polyps: Secondary | ICD-10-CM | POA: Diagnosis not present

## 2017-11-08 MED ORDER — SODIUM CHLORIDE 0.9 % IV SOLN: 500.0000 mL | INTRAVENOUS | Status: DC

## 2017-11-08 MED ORDER — RANITIDINE HCL 150 MG PO TABS: 150.0000 mg | ORAL_TABLET | Freq: Two times a day (BID) | ORAL | 11 refills | Status: DC

## 2017-11-08 NOTE — Progress Notes (Signed)
Called to room to assist during endoscopic procedure.  Patient ID and intended procedure confirmed with present staff. Received instructions for my participation in the procedure from the performing physician.  

## 2017-11-08 NOTE — Progress Notes (Signed)
Spontaneous respirations throughout. VSS. Resting comfortably. To PACU on room air. Report to  RN. 

## 2017-11-08 NOTE — Op Note (Signed)
[place] Patient Name: [name] Procedure Date: 11/08/2017 2:05 PM MRN: [id number] Endoscopist: Ladene Artist , MD Age: 81 Referring MD:  Date of Birth: 06/18/1936 Gender: Male Account #: [id number] Procedure:                Upper GI endoscopy Indications:              Dysphagia, Weight loss Medicines:                Monitored Anesthesia Care Procedure:                Pre-Anesthesia Assessment:                           - Prior to the procedure, a History and Physical                            was performed, and patient medications and                            allergies were reviewed. The patient's tolerance of                            previous anesthesia was also reviewed. The risks                            and benefits of the procedure and the sedation                            options and risks were discussed with the patient.                            All questions were answered, and informed consent                            was obtained. Prior Anticoagulants: The patient has                            taken Xarelto (rivaroxaban), last dose was 2 days                            prior to procedure. ASA Grade Assessment: III - A                            patient with severe systemic disease. After                            reviewing the risks and benefits, the patient was                            deemed in satisfactory condition to undergo the                            procedure.  After obtaining informed consent, the endoscope was                            passed under direct vision. Throughout the                            procedure, the patient's blood pressure, pulse, and                            oxygen saturations were monitored continuously. The                            Endoscope was introduced through the mouth, and                            advanced to the second part of duodenum. The upper            GI endoscopy was accomplished without difficulty.                            The patient tolerated the procedure well. Scope In: Scope Out: Findings:                 One moderate benign-appearing, intrinsic stenosis                            was found at the gastroesophageal junction. This                            measured 1.2 cm (inner diameter) and was traversed.                            A guidewire was placed and the scope was withdrawn.                            Dilations were performed with Savary dilators with                            mild resistance at 13 mm, 14 mm and 15 mm.                           The exam of the esophagus was otherwise normal.                           The entire examined stomach was normal.                           A few localized erosions without bleeding were                            found in the duodenal bulb and in the second                            portion of the duodenum. Biopsies were taken with a  cold forceps for histology. Complications:            No immediate complications. Estimated Blood Loss:     Estimated blood loss was minimal. Impression:               - Benign-appearing esophageal stenosis. Dilated.                           - Normal stomach.                           - Normal duodenal bulb and second portion of the                            duodenum. Biopsied.                           - No specimens collected. Recommendation:           - Patient has a contact number available for                            emergencies. The signs and symptoms of potential                            delayed complications were discussed with the                            patient. Return to normal activities tomorrow.                            Written discharge instructions were provided to the                            patient.                           - Clear liquid diet for 2 hours, then advance as                             tolerated to soft diet today. Resume prior diet                            tomorrow.                           - Continue present medications.                           - Change raniditine to 300 mg po bid, 1 year of                            refills.                           - Await pathology results.                           -  Return to GI office in 2 months. Ladene Artist, MD 11/08/2017 2:51:41 PM This report has been signed electronically.

## 2017-11-08 NOTE — Op Note (Signed)
[place] Patient Name: [name] Procedure Date: 11/08/2017 2:05 PM MRN: [id number] Endoscopist: Ladene Artist , MD Age: 81 Referring MD:  Date of Birth: [birth year] Gender: Male Account #: [id number] Procedure:                Colonoscopy Indications:              Clinically significant diarrhea of unexplained                            origin, Weight loss, Personal history of                            adenomatous colon polyps Medicines:                Monitored Anesthesia Care Procedure:                Pre-Anesthesia Assessment:                           - Prior to the procedure, a History and Physical                            was performed, and patient medications and                            allergies were reviewed. The patient's tolerance of                            previous anesthesia was also reviewed. The risks                            and benefits of the procedure and the sedation                            options and risks were discussed with the patient.                            All questions were answered, and informed consent                            was obtained. Prior Anticoagulants: The patient has                            taken Xarelto (rivaroxaban), last dose was 2 days                            prior to procedure. ASA Grade Assessment: III - A                            patient with severe systemic disease. After                            reviewing the risks and benefits, the patient was  deemed in satisfactory condition to undergo the                            procedure.                           After obtaining informed consent, the colonoscope                            was passed under direct vision. Throughout the                            procedure, the patient's blood pressure, pulse, and                            oxygen saturations were monitored continuously. The   Model PCF-[id number]) scope was introduced                            through the anus and advanced to the the terminal                            ileum, with identification of the appendiceal                            orifice and IC valve. The terminal ileum, ileocecal                            valve, appendiceal orifice, and rectum were                            photographed. The quality of the bowel preparation                            was excellent. The colonoscopy was performed                            without difficulty. The patient tolerated the                            procedure well. Scope In: 2:13:59 PM Scope Out: 2:24:16 PM Scope Withdrawal Time: 0 hours 9 minutes 19 seconds  Total Procedure Duration: 0 hours 10 minutes 17 seconds  Findings:                 The perianal and digital rectal examinations were                            normal.                           The terminal ileum appeared normal.                           A 7 mm polyp was found in the sigmoid colon. The  polyp was sessile. The polyp was removed with a                            cold snare. Resection and retrieval were complete.                           Internal hemorrhoids were found during                            retroflexion. The hemorrhoids were small and Grade                            I (internal hemorrhoids that do not prolapse).                           The exam was otherwise without abnormality on                            direct and retroflexion views. Random biopsies                            obtained thoughout the colon. Complications:            No immediate complications. Estimated blood loss:                            None. Estimated Blood Loss:     Estimated blood loss: none. Impression:               - The examined portion of the ileum was normal.                           - One 7 mm polyp in the sigmoid colon, removed with                             a cold snare. Resected and retrieved.                           - Internal hemorrhoids.                           - The examination was otherwise normal on direct                            and retroflexion views. Biopsies obtained. Recommendation:           - Resume Xarelto (rivaroxaban) tomorrow at prior                            dose. Refer to managing physician for further                            adjustment of therapy.                           - Patient has a contact number  available for                            emergencies. The signs and symptoms of potential                            delayed complications were discussed with the                            patient. Return to normal activities tomorrow.                            Written discharge instructions were provided to the                            patient.                           - Resume previous diet.                           - Continue present medications.                           - Await pathology results.                           - No repeat colonoscopy due to age. Ladene Artist, MD 11/08/2017 2:40:52 PM This report has been signed electronically.

## 2017-11-08 NOTE — Patient Instructions (Addendum)
YOU HAD AN ENDOSCOPIC PROCEDURE TODAY AT [facility]:   Refer to the procedure report that was given to you for any specific questions about what was found during the examination.  If the procedure report does not answer your questions, please call your gastroenterologist to clarify.  If you requested that your care partner not be given the details of your procedure findings, then the procedure report has been included in a sealed envelope for you to review at your convenience later.  YOU SHOULD EXPECT: Some feelings of bloating in the abdomen. Passage of more gas than usual.  Walking can help get rid of the air that was put into your GI tract during the procedure and reduce the bloating. If you had a lower endoscopy (such as a colonoscopy or flexible sigmoidoscopy) you may notice spotting of blood in your stool or on the toilet paper. If you underwent a bowel prep for your procedure, you may not have a normal bowel movement for a few days.  Please Note:  You might notice some irritation and congestion in your nose or some drainage.  This is from the oxygen used during your procedure.  There is no need for concern and it should clear up in a day or so.  SYMPTOMS TO REPORT IMMEDIATELY:   Following lower endoscopy (colonoscopy or flexible sigmoidoscopy):  Excessive amounts of blood in the stool  Significant tenderness or worsening of abdominal pains  Swelling of the abdomen that is new, acute  Fever of 100F or higher   Following upper endoscopy (EGD)  Vomiting of blood or coffee ground material  New chest pain or pain under the shoulder blades  Painful or persistently difficult swallowing  New shortness of breath  Fever of 100F or higher  Black, tarry-looking stools  For urgent or emergent issues, a gastroenterologist can be reached at any hour by calling [phone].   DIET:  We do recommend a clear liquid diet for 2 hours, then a soft diet for the rest of today. You  may have a regular diet tomorrow..  Drink plenty of fluids but you should avoid alcoholic beverages for 24 hours.  ACTIVITY:  You should plan to take it easy for the rest of today and you should NOT DRIVE or use heavy machinery until tomorrow (because of the sedation medicines used during the test).    FOLLOW UP: Our staff will call the number listed on your records the next business day following your procedure to check on you and address any questions or concerns that you may have regarding the information given to you following your procedure. If we do not reach you, we will leave a message.  However, if you are feeling well and you are not experiencing any problems, there is no need to return our call.  We will assume that you have returned to your regular daily activities without incident.  If any biopsies were taken you will be contacted by phone or by letter within the next 1-3 weeks.  Please call us at [phone] if you have not heard about the biopsies in 3 weeks.    SIGNATURES/CONFIDENTIALITY: You and/or your care partner have signed paperwork which will be entered into your electronic medical record.  These signatures attest to the fact that that the information above on your After Visit Summary has been reviewed and is understood.  Full responsibility of the confidentiality of this discharge information lies with you and/or your care-partner.  You may  restart your xarelto tomorrow per Dr. Fuller Plan.  You also need to see your cardiologist per Dr. Fuller Plan.

## 2017-11-09 ENCOUNTER — Telehealth (HOSPITAL_COMMUNITY): Payer: Self-pay | Admitting: *Deleted

## 2017-11-09 ENCOUNTER — Telehealth: Payer: Self-pay

## 2017-11-09 MED ORDER — RANITIDINE HCL 150 MG PO TABS
300.0000 mg | ORAL_TABLET | Freq: Two times a day (BID) | ORAL | 11 refills | Status: DC
Start: 2017-11-09 — End: 2019-01-04

## 2017-11-09 NOTE — Telephone Encounter (Signed)
  Follow up Call-  Call back number 11/08/2017  Post procedure Call Back phone  # [phone] hm  Permission to leave phone message Yes  Some recent data might be hidden     Patient questions:  Do you have a fever, pain , or abdominal swelling? No. Pain Score  0 *  Have you tolerated food without any problems? Yes.    Have you been able to return to your normal activities? Yes.    Do you have any questions about your discharge instructions: Diet   No. Medications  No. Follow up visit  No.  Do you have questions or concerns about your Care? No.  Actions: * If pain score is 4 or above: No action needed, pain <4.

## 2017-11-09 NOTE — Telephone Encounter (Signed)
Advanced Heart Failure Triage Encounter  Patient Name: [name]  Date of Call: 11/09/17  Problem: Afib  Patient's wfie called to report that while patient was having his colonoscopy yesterday the RN advised him to contact our clinic because he was back in Afib.  I asked if he could come in for a nurse visit today so that we could do an EKG but he wants to come tomorrow instead.  He has been holding his Xarelto since Sunday night for his procedure but he is restarting it this evening.   Plan:  I have scheduled patient for a Nurse visit tomorrow at 9:00AM for an EKG.  Will send to Shirley Muscat, RN who will be on triage tomorrow.   Darron Doom, RN

## 2017-11-09 NOTE — Addendum Note (Signed)
Addended by: Dustin Flock T on: 11/09/2017 07:47 AM   Modules accepted: Orders

## 2017-11-10 ENCOUNTER — Other Ambulatory Visit: Payer: Self-pay

## 2017-11-10 ENCOUNTER — Ambulatory Visit (HOSPITAL_COMMUNITY)
Admission: RE | Admit: 2017-11-10 | Discharge: 2017-11-10 | Disposition: A | Discharge status: Home / Self Care | Payer: Medicare Other | Source: Ambulatory Visit | Attending: Internal Medicine | Admitting: Internal Medicine

## 2017-11-10 DIAGNOSIS — I4891 Unspecified atrial fibrillation: Secondary | ICD-10-CM | POA: Insufficient documentation

## 2017-11-10 DIAGNOSIS — R000 Tachycardia, unspecified: Secondary | ICD-10-CM | POA: Insufficient documentation

## 2017-11-10 NOTE — Patient Instructions (Signed)
Will consult with [name] about ablation for A-fib  Your physician recommends that you schedule a follow-up appointment in: Keep next months appointment

## 2017-11-14 ENCOUNTER — Ambulatory Visit (HOSPITAL_COMMUNITY)
Admission: RE | Admit: 2017-11-14 | Discharge: 2017-11-14 | Disposition: A | Discharge status: Home / Self Care | Payer: Medicare Other | Source: Ambulatory Visit | Attending: Cardiology | Admitting: Cardiology

## 2017-11-14 ENCOUNTER — Encounter (HOSPITAL_COMMUNITY): Payer: Medicare Other

## 2017-11-14 DIAGNOSIS — R000 Tachycardia, unspecified: Secondary | ICD-10-CM | POA: Diagnosis not present

## 2017-11-14 DIAGNOSIS — I4891 Unspecified atrial fibrillation: Secondary | ICD-10-CM | POA: Diagnosis not present

## 2017-11-21 ENCOUNTER — Ambulatory Visit (HOSPITAL_COMMUNITY)
Admission: RE | Admit: 2017-11-21 | Discharge: 2017-11-21 | Disposition: A | Discharge status: Home / Self Care | Payer: Medicare Other | Source: Ambulatory Visit | Attending: Cardiology | Admitting: Cardiology

## 2017-11-21 VITALS — BP 122/68 | HR 83 | Wt 120.2 lb

## 2017-11-21 DIAGNOSIS — R9431 Abnormal electrocardiogram [ECG] [EKG]: Secondary | ICD-10-CM | POA: Diagnosis not present

## 2017-11-21 DIAGNOSIS — I4891 Unspecified atrial fibrillation: Secondary | ICD-10-CM | POA: Insufficient documentation

## 2017-11-21 NOTE — Patient Instructions (Addendum)
Increase Furosemide to 40 mg in AM and 20 mg in PM for 1 week, if shortness of breathe not improving can go back to 40 mg daily  Keep follow up as scheduled 12/08/17 with Dr Aundra Dubin

## 2017-11-21 NOTE — Progress Notes (Signed)
Pt in for repeat EKG today, EKG reviewed by Dr Aundra Dubin, he states SR w/freq PACs.  He discussed with pt, pt does c/o occ SOB and fatigue.  Dr Aundra Dubin recommended pt increase Lasix to 40 mg in AM and 20 mg in PM for 1 week to see if it helps with SOB, if not pt may return to 40 mg daily.  Pt and wife both aware and verbalizes understanding.  They will keep f/u appt as scheduled on 12/08/17 with Dr Aundra Dubin.

## 2017-11-22 ENCOUNTER — Encounter: Payer: Self-pay | Admitting: Gastroenterology

## 2017-12-07 ENCOUNTER — Other Ambulatory Visit: Payer: Self-pay | Admitting: Nurse Practitioner

## 2017-12-08 ENCOUNTER — Ambulatory Visit (HOSPITAL_COMMUNITY)
Admission: RE | Admit: 2017-12-08 | Discharge: 2017-12-08 | Disposition: A | Discharge status: Home / Self Care | Payer: Medicare Other | Source: Ambulatory Visit | Attending: Cardiology | Admitting: Cardiology

## 2017-12-08 VITALS — BP 138/68 | HR 78 | Wt 122.8 lb

## 2017-12-08 DIAGNOSIS — E785 Hyperlipidemia, unspecified: Secondary | ICD-10-CM | POA: Insufficient documentation

## 2017-12-08 DIAGNOSIS — Z951 Presence of aortocoronary bypass graft: Secondary | ICD-10-CM | POA: Insufficient documentation

## 2017-12-08 DIAGNOSIS — K219 Gastro-esophageal reflux disease without esophagitis: Secondary | ICD-10-CM | POA: Insufficient documentation

## 2017-12-08 DIAGNOSIS — I480 Paroxysmal atrial fibrillation: Secondary | ICD-10-CM

## 2017-12-08 DIAGNOSIS — Z87891 Personal history of nicotine dependence: Secondary | ICD-10-CM | POA: Diagnosis not present

## 2017-12-08 DIAGNOSIS — I2510 Atherosclerotic heart disease of native coronary artery without angina pectoris: Secondary | ICD-10-CM | POA: Diagnosis not present

## 2017-12-08 DIAGNOSIS — I252 Old myocardial infarction: Secondary | ICD-10-CM | POA: Insufficient documentation

## 2017-12-08 DIAGNOSIS — Z79899 Other long term (current) drug therapy: Secondary | ICD-10-CM | POA: Insufficient documentation

## 2017-12-08 DIAGNOSIS — N183 Chronic kidney disease, stage 3 (moderate): Secondary | ICD-10-CM | POA: Diagnosis not present

## 2017-12-08 DIAGNOSIS — R0683 Snoring: Secondary | ICD-10-CM

## 2017-12-08 DIAGNOSIS — I5032 Chronic diastolic (congestive) heart failure: Secondary | ICD-10-CM | POA: Diagnosis not present

## 2017-12-08 DIAGNOSIS — Z7901 Long term (current) use of anticoagulants: Secondary | ICD-10-CM | POA: Insufficient documentation

## 2017-12-08 DIAGNOSIS — R7989 Other specified abnormal findings of blood chemistry: Secondary | ICD-10-CM

## 2017-12-08 DIAGNOSIS — I4891 Unspecified atrial fibrillation: Secondary | ICD-10-CM | POA: Diagnosis not present

## 2017-12-08 DIAGNOSIS — Z8673 Personal history of transient ischemic attack (TIA), and cerebral infarction without residual deficits: Secondary | ICD-10-CM | POA: Diagnosis not present

## 2017-12-08 LAB — LIPID PANEL
CHOLESTEROL: 148 mg/dL (ref 0–200)
HDL: 52 mg/dL (ref >40)
LDL Cholesterol: 73 mg/dL (ref 0–99)
TRIGLYCERIDES: 116 mg/dL (ref <150)
Total CHOL/HDL Ratio: 2.8 RATIO
VLDL: 23 mg/dL (ref 0–40)

## 2017-12-08 LAB — CBC
HEMATOCRIT: 41.6 % (ref 39.0–52.0)
Hemoglobin: 13.9 g/dL (ref 13.0–17.0)
MCH: 32.3 pg (ref 26.0–34.0)
MCHC: 33.4 g/dL (ref 30.0–36.0)
MCV: 96.5 fL (ref 78.0–100.0)
Platelets: 180 10*3/uL (ref 150–400)
RBC: 4.31 MIL/uL (ref 4.22–5.81)
RDW: 13.4 % (ref 11.5–15.5)
WBC: 6.3 10*3/uL (ref 4.0–10.5)

## 2017-12-08 LAB — BASIC METABOLIC PANEL
Anion gap: 8 (ref 5–15)
BUN: 18 mg/dL (ref 6–20)
CHLORIDE: 98 mmol/L — AB (ref 101–111)
CO2: 31 mmol/L (ref 22–32)
Calcium: 10 mg/dL (ref 8.9–10.3)
Creatinine, Ser: 1.41 mg/dL — ABNORMAL HIGH (ref 0.61–1.24)
GFR calc Af Amer: 53 mL/min — ABNORMAL LOW (ref >60)
GFR calc non Af Amer: 46 mL/min — ABNORMAL LOW (ref >60)
GLUCOSE: 118 mg/dL — AB (ref 65–99)
POTASSIUM: 3.6 mmol/L (ref 3.5–5.1)
Sodium: 137 mmol/L (ref 135–145)

## 2017-12-08 MED ORDER — RIVAROXABAN 15 MG PO TABS: 15.0000 mg | ORAL_TABLET | Freq: Every evening | ORAL | 3 refills | Status: DC

## 2017-12-08 NOTE — Patient Instructions (Signed)
Routine lab work today. Will notify you of abnormal results  Follow up with Dr.McLean in 6 months  

## 2017-12-08 NOTE — Addendum Note (Signed)
Encounter addended by: Larey Dresser, MD on: 12/08/2017 10:15 PM  Actions taken: Sign clinical note

## 2017-12-08 NOTE — Progress Notes (Addendum)
Patient ID: [name], male   DOB: Sep 03, 1936, 81 y.o.   MRN: [id number] PCP: [name] Cardiology: [name]  81 yo with history of CAD s/p CABG presents for cardiology followup.  His last cath was in 11/09.  The SVG-distal RCA was patent, the CFX system was patent.  His LIMA was atretic and there were serial 60% and 80% stenoses in the native LAD.  He was managed medically.  Echo in 8/14 showed EF 55-60% with moderate MR and moderate TR.   He was initially noted to have atrial fibrillation in the summer of 2014. He was started on Xarelto and cardioverted to NSR in 8/14.  Recurrent atrial fibrillation was noted in 1/15, and he was cardioverted to NSR again.  This time, NSR did not hold long. By 5/15, he was in persistent atrial fibrillation.  I referred him to [facility] where he had atrial fibrillation ablation and Tikosyn loading.  Ranolazine was stopped due to risk of QT prolongation and Imdur was started as an anti-anginal instead.  Lexiscan Cardiolite in 11/15 showed no ischemia.    CTA chest done in 2/16 to look for evidence for PV stenosis post-AF ablation.  This showed mild short-segment narrowing of the left inferior pulmonary vein.   Patient was admitted in 5/18 with acute on chronic diastolic CHF.  He had been at the beach for [place] days and ate out a number of times, probably getting a significant sodium load.  No chest pain.  He was in normal sinus rhythm.  He was started on IV Lasix and diuresed.  Echo in 5/18 showed EF 65-70% with moderately dilated RV and normal systolic function, PASP 57 mmHg.  Lexiscan Cardiolite in 6/18 showed no significant perfusion defect.   Patient returns for followup of CHF and atrial fibrillation today.  He was noted to be in atrial fibrillation in 11/18, he was not symptomatic.  Today, he is in NSR.  He is symptomatically stable. No chest pain.  No dyspnea walking on flat ground. He is short of breath walking up an incline.  He is short of breath when he  carries a load.  No BRBPR/melena.  Weight is down. No palpitations. Still working 30 hrs/week.  He reports daytime sleepiness and wife says that he snores at night.   ECG: Personally reviewed.  NSR, nonspecific T wave flattening, QTc 477 msec  Labs (8/12): K 4.1, creatinine 1.2, LDL 91, HDL 53 Labs (8/14): K 4.6, creatinine 1.1 Labs (11/14): K 4.6, creatinine 1.2, LDL 91, HDL 56 Labs (3/15): AST 38, ALT 32, TSH normal, BNP 237 Labs (7/15): LDL 96, HDL 48, K 4.2, creatinine 1.2, TSH normal, BNP 237, AST 38, ALT 32 Labs (8/15): LFTs normal Labs (11/15): K 4.2, creatinine 1.1, Mg 2.2, BNP 227 Labs (2/16): K 4, creatinine 1.32, BNP 261 Labs (6/18): K 3.9, creatinine 1.6 Labs (7/18): K 3.9, creatinine 1.5, BNP 242  PMH: 1. CAD: 1st MI in 1988.  CABG 1996.  PCI to CFX in 2009.  LHC (11/09) SVG-dRCA patent, total occlusion RCA, patent CFX stent, atretic LIMA, serial 60 and 80% proximal LAD stenoses, EF 60% with basal inferior hypokinesis.  Myoview in 2011 with no ischemia or infarction.  Echo (10/12) with EF 55-60%, mild LVH, mild MR.  Lexiscan Cardiolite in 2013 with no ischemia or infarction.  Echo (8/14) with EF 55-60%, moderate MR, moderate TR, PA systolic pressure 35 mmHg.  Echo (4/15) with EF 60-65%, mild focal basal septal hypertrophy, inferior basal akinesis,  mild MR.  Lexiscan cardiolite (11/15) with EF 61%, fixed basal inferoseptal defect, no ischemia.  - Lexiscan Cardiolite (6/18): EF 54%, no perfusion defect.  2. Raynauds 3. Post-polio syndrome 4. GERD with dilation of esophageal stricture in 12/12.  5. Hyperlipidemia 6. Brainstem stroke with Horner's syndrome.  7. Scoliosis. 8. H/o appendectomy 9. Herpes Zoster 10. Atrial fibrillation: DCCV to NSR in 8/14. DCCV to NSR in 1/15. Atrial fibrillation ablation 6/15 with Tikosyn loading (at [facility]).   11. PFTs (4/15) with FVC 59%, FEV1 54%, ratio 91%, DLCO 53% => moderate obstructive defect thought to be related to COPD and severe restrictive  defect thought to be due to elevated left hemidiaphragm and post-polio syndrome.  12. Chronic diastolic CHF.  Echo (5/18) with EF 65-70%, RV moderately dilated with normal systolic function, PASP 57 mmHg.  13. CKD: Stage 3.   SH: quit tobacco while in his 77s.  Married.  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   FH: Brother with CAD, heart transplant.   ROS: All systems reviewed and negative except as per HPI.   Current Outpatient Medications  Medication Sig Dispense Refill  . acetaminophen (TYLENOL) 500 MG tablet Take 500 mg by mouth every 12 (twelve) hours.    [name] albuterol (PROAIR HFA) 108 (90 BASE) MCG/ACT inhaler Inhale 2 puffs into the lungs every 4 (four) hours as needed for wheezing or shortness of breath (and prior to exercise). 1 Inhaler 3  . AMBULATORY NON FORMULARY MEDICATION Testosterone cream 150mg /15% Place 15% onto the skin daily    . BYSTOLIC 10 MG tablet TAKE 1 TABLET BY MOUTH EVERY DAY 30 tablet 9  . cetirizine (ZYRTEC) 10 MG tablet Take 10 mg by mouth.    . diltiazem (CARDIZEM CD) 120 MG 24 hr capsule Take 1 capsule (120 mg total) by mouth daily. 90 capsule 3  . dofetilide (TIKOSYN) 250 MCG capsule TAKE 1 CAPSULE BY MOUTH EVERY 12 HOURS 180 capsule 1  . ezetimibe (ZETIA) 10 MG tablet TAKE 1 TABLET BY MOUTH EVERY DAY 90 tablet 0  . furosemide (LASIX) 40 MG tablet Take 40 mg by mouth daily. Take 20 mg in PM if needed    . lactobacillus acidophilus & bulgar (LACTINEX) chewable tablet Chew 2 tablets by mouth daily.     [name] loperamide (IMODIUM) 2 MG capsule Take by mouth as needed for diarrhea or loose stools.    [name] LORazepam (ATIVAN) 0.5 MG tablet Take 0.5 mg by mouth at bedtime as needed for sleep.     . methocarbamol (ROBAXIN) 500 MG tablet Take 500 mg by mouth as needed for muscle spasms.    . potassium chloride SA (KLOR-CON M20) 20 MEQ tablet Take 1 tablet (20 mEq total) by mouth 2 (two) times daily. 180 tablet 3  . ranitidine (ZANTAC) 150 MG tablet Take 2 tablets (300 mg total)  2 (two) times daily by mouth. 120 tablet 11  . Rivaroxaban (XARELTO) 15 MG TABS tablet Take 1 tablet (15 mg total) by mouth every evening. 90 tablet 3  . rosuvastatin (CRESTOR) 40 MG tablet Take 40 mg by mouth every evening.    . traZODone (DESYREL) 50 MG tablet Take 50 mg by mouth at bedtime.    . Vitamin D, Ergocalciferol, (DRISDOL) 50000 UNITS CAPS capsule Take 50,000 Units by mouth every 14 (fourteen) days. Take on 1st and 15th     No current facility-administered medications for this encounter.     BP 130/68  Pulse 66  Ht 5\' 6"  (1.676 m)  Wt 60.328 kg (133 lb)  BMI 21.47 kg/m2 General: NAD Neck: No JVD, no thyromegaly or thyroid nodule.  Lungs: Clear to auscultation bilaterally with normal respiratory effort. CV: Nondisplaced PMI.  Heart regular S1/S2, no S3/S4, no murmur.  No peripheral edema.  No carotid bruit.  Normal pedal pulses.  Abdomen: Soft, nontender, no hepatosplenomegaly, no distention.  Skin: Intact without lesions or rashes.  Neurologic: Alert and oriented x 3.  Psych: Normal affect. Extremities: No clubbing or cyanosis.  HEENT: Normal.   Assessment/Plan: 1. Atrial fibrillation: He has been on Tikosyn and is s/p atrial fibrillation ablation at [facility] on 06/25/14.  He is in NSR today though noted to be in atrial fibrillation in 11/18 (he was not symptomatic).   - Continue Tikosyn.  QTc acceptable today.    - Continue Xarelto and nebivolol.  - Check CBC given Xarelto use.  - If he has frequent breakthrough episodes of symptomatic atrial fibrillation, could consider redo ablation.  2. CAD: He is not taking aspirin as he has stable CAD and is on Xarelto.  Continue nebivolol and Crestor.  Recent Cardiolite (6/18) showed no ischemia or infarction.   3. Hyperlipidemia: He is taking Crestor 40 mg daily and Zetia 10 mg daily.  Check lipids today.  4. Chronic diastolic CHF: NYHA class II symptoms, stable.  He does not appear significantly volume overloaded on exam, but still has  some exertional dyspnea (may be due mainly to elevated left hemidiaphragm and post-polio syndrome).  - Continue Lasix 40 qd   - BMET today.  5. Pulmonary: PFTs suggested both a restrictive and obstructive defect.  The restrictive defect is likely from post-polio syndrome and elevated left hemidiaphragm.  Mr Dollins never smoked much, so the obstructive defect is more surprising.  6. Suspect OSA: Important to treat if present given atrial fibrillation.  I will arrange for sleep study.   Followup in 6 months    [name] 12/08/2017

## 2017-12-12 ENCOUNTER — Telehealth (HOSPITAL_COMMUNITY): Payer: Self-pay | Admitting: Vascular Surgery

## 2017-12-12 NOTE — Telephone Encounter (Signed)
Left pt message giving sleep study appt 01/10/18

## 2017-12-14 ENCOUNTER — Encounter (HOSPITAL_COMMUNITY): Payer: Self-pay

## 2017-12-15 ENCOUNTER — Encounter (HOSPITAL_COMMUNITY): Payer: Self-pay

## 2017-12-15 DIAGNOSIS — E298 Other testicular dysfunction: Secondary | ICD-10-CM | POA: Diagnosis not present

## 2017-12-15 DIAGNOSIS — E291 Testicular hypofunction: Secondary | ICD-10-CM | POA: Diagnosis not present

## 2018-01-10 ENCOUNTER — Ambulatory Visit (HOSPITAL_BASED_OUTPATIENT_CLINIC_OR_DEPARTMENT_OTHER): Payer: Medicare Other | Attending: Cardiology | Admitting: Cardiology

## 2018-01-10 VITALS — Ht 66.0 in | Wt 125.0 lb

## 2018-01-10 DIAGNOSIS — I4891 Unspecified atrial fibrillation: Secondary | ICD-10-CM | POA: Insufficient documentation

## 2018-01-10 DIAGNOSIS — I493 Ventricular premature depolarization: Secondary | ICD-10-CM | POA: Insufficient documentation

## 2018-01-10 DIAGNOSIS — G4736 Sleep related hypoventilation in conditions classified elsewhere: Secondary | ICD-10-CM | POA: Insufficient documentation

## 2018-01-10 DIAGNOSIS — G4733 Obstructive sleep apnea (adult) (pediatric): Secondary | ICD-10-CM | POA: Insufficient documentation

## 2018-01-10 DIAGNOSIS — R0683 Snoring: Secondary | ICD-10-CM

## 2018-01-12 NOTE — Procedures (Signed)
NAME: [name] DATE OF BIRTH:  [birth year] MEDICAL RECORD NUMBER [id number]  LOCATION: [facility]  PHYSICIAN: Marquelle Balow  DATE OF STUDY: 01/10/2018  SLEEP STUDY TYPE: Positive Airway Pressure Titration               REFERRING PHYSICIAN: Larey Dresser, MD  CLINICAL INFORMATION Sleep Study Type: Split Night CPAP  Indication for sleep study: Snoring  Epworth Sleepiness Score: 11  SLEEP STUDY TECHNIQUE As per the AASM Manual for the Scoring of Sleep and Associated Events v2.3 (April 2016) with a hypopnea requiring 4% desaturations.  The channels recorded and monitored were frontal, central and occipital EEG, electrooculogram (EOG), submentalis EMG (chin), nasal and oral airflow, thoracic and abdominal wall motion, anterior tibialis EMG, snore microphone, electrocardiogram, and pulse oximetry. Continuous positive airway pressure (CPAP) was initiated when the patient met split night criteria and was titrated according to treat sleep-disordered breathing.  MEDICATIONS Medications self-administered by patient taken the night of the study : N/A  RESPIRATORY PARAMETERS Diagnostic Total AHI (/hr): 37.2  RDI (/hr):39.5  OA Index (/hr): 4.2  CA Index (/hr): 0.0 REM AHI (/hr): N/A  NREM AHI (/hr):37.2  Supine AHI (/hr):37.2  Non-supine AHI (/hr):N/A Min O2 Sat (%):82.00  Mean O2 (%): 88.59  Time below 88% (min):61.7   Titration Optimal Pressure (cm):N/A  AHI at Optimal Pressure (/hr):N/A  Min O2 at Optimal Pressure (%):N/A Supine % at Optimal (%):N/A  Sleep % at Optimal (%):N/A   SLEEP ARCHITECTURE The recording time for the entire night was 365.3 minutes.  During a baseline period of 172.9 minutes, the patient slept for 129.0 minutes in REM and nonREM, yielding a sleep efficiency of 74.6%. Sleep onset after lights out was 37.4 minutes with a REM latency of N/A minutes. The patient spent 29.10% of the night in stage N1 sleep, 70.90% in stage N2  sleep, 0.00% in stage N3 and 0.00% in REM.   During the titration period of 184.6 minutes, the patient slept for 175.4 minutes in REM and nonREM, yielding a sleep efficiency of 95.0%. Sleep onset after CPAP initiation was 2.7 minutes with a REM latency of 17.5 minutes. The patient spent 12.54% of the night in stage N1 sleep, 53.26% in stage N2 sleep, 0.00% in stage N3 and 34.20% in REM.  CARDIAC DATA The 2 lead EKG demonstrated atrial fibrillation. The mean heart rate was 77.25 beats per minute. Other EKG findings include: PVCs.  LEG MOVEMENT DATA The total Periodic Limb Movements of Sleep (PLMS) were 0. The PLMS index was 0.00 .  IMPRESSIONS - Severe obstructive sleep apnea occurred during the diagnostic portion of the study (AHI = 37.2/hour). An optimal PAP pressure could not be obtained due to ongoing respiratory events.  - No significant central sleep apnea occurred during the diagnostic portion of the study (CAI = 0.0/hour). - Moderate oxygen desaturation was noted during the diagnostic portion of the study (Min O2 =82.00%). - The patient snored with loud snoring volume during the diagnostic portion of the study. - EKG findings include PVCs and atrial fibrillation. - Clinically significant periodic limb movements did not occur during sleep.  DIAGNOSIS - Obstructive Sleep Apnea (327.23 [G47.33 ICD-10]) - Atrial Fibrillation - PVCs - Nocturnal Hypoxemia  RECOMMENDATIONS - Recommend repeat full night CPAP titration. - Avoid alcohol, sedatives and other CNS depressants that may worsen sleep apnea and disrupt normal sleep architecture. - Sleep hygiene should be reviewed to assess factors that may improve sleep quality. -  Weight management and regular exercise should be initiated or continued.  [place], American Board of Sleep Medicine  ELECTRONICALLY SIGNED ON:  01/12/2018, 8:13 PM [place] PH: [phone]   FX: [phone] [place]

## 2018-01-13 ENCOUNTER — Telehealth (HOSPITAL_COMMUNITY): Payer: Self-pay | Admitting: *Deleted

## 2018-01-13 NOTE — Telephone Encounter (Signed)
Use saline nasal spray to get nasal passages moist and may hold Xarelto for 1-2 days.

## 2018-01-13 NOTE — Telephone Encounter (Signed)
pts wife aware and agreeable/.  

## 2018-01-13 NOTE — Telephone Encounter (Signed)
Pts wife left VM stating pt has been having a nose bleed on and off for the last few days. Nose bleeds for about 59min. Seems to have gotten worse this morning. Pt is on Xarelto and wants to know if he should cut back on Xarelto. Message routed to [place] for advice.

## 2018-01-23 ENCOUNTER — Ambulatory Visit (INDEPENDENT_AMBULATORY_CARE_PROVIDER_SITE_OTHER): Payer: Medicare Other | Admitting: Gastroenterology

## 2018-01-23 ENCOUNTER — Encounter: Payer: Self-pay | Admitting: Gastroenterology

## 2018-01-23 VITALS — BP 108/62 | HR 74 | Ht 66.0 in | Wt 129.0 lb

## 2018-01-23 DIAGNOSIS — K222 Esophageal obstruction: Secondary | ICD-10-CM | POA: Diagnosis not present

## 2018-01-23 DIAGNOSIS — K219 Gastro-esophageal reflux disease without esophagitis: Secondary | ICD-10-CM | POA: Diagnosis not present

## 2018-01-23 DIAGNOSIS — Z86010 Personal history of colonic polyps: Secondary | ICD-10-CM

## 2018-01-23 DIAGNOSIS — R197 Diarrhea, unspecified: Secondary | ICD-10-CM

## 2018-01-23 NOTE — Progress Notes (Signed)
    History of Present Illness: This is a 82 year old male returning for follow-up of diarrhea, dysphasia and weight loss.  He is accompanied by his wife.  His dysphagia resolved following esophageal dilation.  Reflux symptoms are controlled on ranitidine.  His weight has increased 4 pounds to 129 since his last visit.  He states he has diarrhea is triggered by certain foods about once every 10 days or so.  He tries to avoid foods that trigger diarrhea.  He takes probiotics daily and takes Imodium when diarrhea is active.  EGD 10/2017 - Benign-appearing esophageal stenosis. Dilated. - Normal stomach. - Normal duodenal bulb and second portion of the duodenum. Biopsied. (normal)  Colon 10/2017 - The examined portion of the ileum was normal. - One 7 mm polyp in the sigmoid colon, removed with a cold snare. Resected and retrieved. (adenoma) - Internal hemorrhoids. - The examination was otherwise normal on direct and retroflexion views. Biopsies obtained. (normal)  Current Medications, Allergies, Past Medical History, Past Surgical History, Family History and Social History were reviewed in [employer] record.  Physical Exam: General: Well developed, well nourished, no acute distress Head: Normocephalic and atraumatic Eyes:  sclerae anicteric, EOMI Ears: Normal auditory acuity Mouth: No deformity or lesions Lungs: Clear throughout to auscultation Heart: Regular rate and rhythm; no murmurs, rubs or bruits Abdomen: Soft, non tender and non distended. No masses, hepatosplenomegaly or hernias noted. Normal Bowel sounds Musculoskeletal: Symmetrical with no gross deformities  Pulses:  Normal pulses noted Extremities: No clubbing, cyanosis, edema or deformities noted Neurological: Alert oriented x 4, grossly nonfocal Psychological:  Alert and cooperative. Normal mood and affect  Assessment and Recommendations:  1.  GERD and esophageal stricture. Continue ranitidine 300 mg  twice daily and follow antireflux measures.  Contact us if dysphagia recurs for repeat esophageal dilation.  REV in 1 year.   2.  Functional diarrhea.  Continue daily probiotic if he feels it is beneficial.  Avoid foods that trigger symptoms.  Imodium 3 times daily as needed diarrhea.  3. Personal history of adenomatous colon polyps.  No plans for future surveillance colonoscopies due to age.  4. A fib, stable, Maintained on Xarelto.   5.  Weight loss.  He has gained some weight and he is comfortable at his current weight around 130 pounds. PCP will monitor.

## 2018-01-23 NOTE — Patient Instructions (Signed)
Thank you for choosing me and [facility].  Malcolm T. Stark, Jr., MD., FACG  

## 2018-01-26 ENCOUNTER — Telehealth: Payer: Self-pay | Admitting: *Deleted

## 2018-01-26 DIAGNOSIS — G4733 Obstructive sleep apnea (adult) (pediatric): Secondary | ICD-10-CM

## 2018-01-26 NOTE — Telephone Encounter (Signed)
-----   Message from Sueanne Margarita, MD sent at 01/12/2018  8:17 PM EST ----- Please let patient know that they have sleep apnea and recommend full night CPAP titration in lab as there was inadequate time for full titration. Please set up titration in the sleep lab.

## 2018-01-26 NOTE — Telephone Encounter (Addendum)
Informed patient of sleep study results and patient understanding was verbalized. Patient understands he has sleep apnea and Dr Radford Pax recommends a full night CPAP titration in lab as there was inadequate time for full titration. Patient understands his Titration study will be done at [facility] sleep lab. Patient understands he will receive a sleep packet in a week or so. Patient understands to call if he does not receive the sleep packet in a timely manner. Patient agrees with treatment and thanked me for call.

## 2018-01-29 ENCOUNTER — Other Ambulatory Visit (HOSPITAL_COMMUNITY): Payer: Self-pay | Admitting: Internal Medicine

## 2018-02-07 NOTE — Addendum Note (Signed)
Addended by: Freada Bergeron on: 02/07/2018 09:43 AM   Modules accepted: Orders

## 2018-02-10 ENCOUNTER — Telehealth: Payer: Self-pay | Admitting: *Deleted

## 2018-02-10 NOTE — Telephone Encounter (Signed)
-----   Message from Sueanne Margarita, MD sent at 01/12/2018  8:17 PM EST ----- Please let patient know that they have sleep apnea and recommend full night CPAP titration in lab as there was inadequate time for full titration. Please set up titration in the sleep lab.

## 2018-02-10 NOTE — Telephone Encounter (Signed)
Informed patient of sleep study results and patient understanding was verbalized. Patient understands he has sleep apnea and Dr Radford Pax recommends full night CPAP titration in lab as there was inadequate time for full titration. Patient states he did not get to sleep until 12 midnight because the technicians kept waking him up and he is NOT  going back to the lab for another test. Patient would like to have a copy of his sleep study and he was advised to contact medical records to get his sleep study. Patient thanked me for calling.

## 2018-02-11 NOTE — Telephone Encounter (Signed)
Please find out if he would be willing to do a home CPAP titration

## 2018-02-17 NOTE — Telephone Encounter (Signed)
Patient would like an explanation from the sleep doctor as to why there was inadequate time for his test before he even thinks about testing again. Patient and his wife was adamant about this decision.

## 2018-02-18 NOTE — Telephone Encounter (Signed)
It is very common to not be able to get an adequate titration during a split night study.  The first part of the study, which makes up the first 2 hours, is done to diagnose OSA. If the sleep apnea meets certain criteria then the tech can proceed with the CPAP titration.  Since he has significant sleep apnea it takes longer to get an adequate titration due to the severity.  Titrations are done using a protocol and it takes longer usually to reach the appropriate pressure the more severe the apneas and therefore we could not get him adequately titrated.  If he wants to see another sleep MD I am fine with that

## 2018-02-20 ENCOUNTER — Other Ambulatory Visit: Payer: Self-pay | Admitting: Nurse Practitioner

## 2018-02-24 ENCOUNTER — Telehealth: Payer: Self-pay | Admitting: *Deleted

## 2018-02-24 NOTE — Telephone Encounter (Signed)
    Reached out to the patient to inform him of Dr Theodosia Blender response as to why there was inadequate time for his test.  Patient/wife understands" It is very common to not be able to get an adequate titration during a split night study. The first part of the study, which makes up the first 2 hours, is done to diagnose OSA. If the sleep apnea meets certain criteria then the tech can proceed with the CPAP titration. Since he has significant sleep apnea it takes longer to get an adequate titration due to the severity. Titrations are done using a protocol and it takes longer usually to reach the appropriate pressure the more severe the apneas and therefore we could not get him adequately titrated. If he wants to see another sleep MD I am fine with that." Patient states he never said he wanted to see another sleep doctor but he wants a copy of his sleep study sent to his PCP Dr Burnard Bunting and he wants Dr [name] the one who referred him to view his study also. PAP assistant contacted medical records and medical records will contact the patient to get a release signed. PAP assistant also sent a staff message to Dr Aundra Dubin to view the patients sleep study in Epic.

## 2018-02-24 NOTE — Telephone Encounter (Signed)
Reached out to the patient to inform him of Dr Theodosia Blender response as to why there was inadequate time for his test.  Patient/wife understands" It is very common to not be able to get an adequate titration during a split night study.  The first part of the study, which makes up the first 2 hours, is done to diagnose OSA. If the sleep apnea meets certain criteria then the tech can proceed with the CPAP titration.  Since he has significant sleep apnea it takes longer to get an adequate titration due to the severity. Titrations are done using a protocol and it takes longer usually to reach the appropriate pressure the more severe the apneas and therefore we could not get him adequately titrated.  If he wants to see another sleep MD I am fine with that." Patient states he never said he wanted to see another sleep doctor but he wants a copy of his sleep study sent to his PCP Dr Burnard Bunting and he wants Dr [name] the one who referred him to view his study also. PAP assistant contacted medical records and medical records will contact the patient to get a release signed. PAP assistant also sent a staff message to Dr Aundra Dubin to view the patients sleep study in Epic.

## 2018-02-28 ENCOUNTER — Telehealth: Payer: Self-pay | Admitting: Cardiology

## 2018-02-28 NOTE — Telephone Encounter (Signed)
Left VM on patients cell asking him to return my call. I will need to get a way to send a release to him so I can forward his sleep study to his PCP per [name].

## 2018-03-03 ENCOUNTER — Telehealth: Payer: Self-pay | Admitting: Cardiology

## 2018-03-03 ENCOUNTER — Telehealth: Payer: Self-pay | Admitting: *Deleted

## 2018-03-03 NOTE — Telephone Encounter (Signed)
Reached out to patient to get his e-mail address so medical records can e-mail him a release form for his sleep study and to send a copy to his PCP. LM on his home voicemail.03/03/18.

## 2018-03-03 NOTE — Telephone Encounter (Signed)
Patient called in about medical release form. Gave me his fax number and release was faxed release he gave me @ [phone].

## 2018-03-20 DIAGNOSIS — Z125 Encounter for screening for malignant neoplasm of prostate: Secondary | ICD-10-CM | POA: Diagnosis not present

## 2018-03-20 DIAGNOSIS — I10 Essential (primary) hypertension: Secondary | ICD-10-CM | POA: Diagnosis not present

## 2018-03-20 DIAGNOSIS — R82998 Other abnormal findings in urine: Secondary | ICD-10-CM | POA: Diagnosis not present

## 2018-03-20 DIAGNOSIS — E291 Testicular hypofunction: Secondary | ICD-10-CM | POA: Diagnosis not present

## 2018-03-20 DIAGNOSIS — E7849 Other hyperlipidemia: Secondary | ICD-10-CM | POA: Diagnosis not present

## 2018-03-20 DIAGNOSIS — M8580 Other specified disorders of bone density and structure, unspecified site: Secondary | ICD-10-CM | POA: Diagnosis not present

## 2018-03-27 ENCOUNTER — Other Ambulatory Visit: Payer: Self-pay | Admitting: Nurse Practitioner

## 2018-03-27 DIAGNOSIS — Z0000 Encounter for general adult medical examination without abnormal findings: Secondary | ICD-10-CM | POA: Diagnosis not present

## 2018-03-27 DIAGNOSIS — Z1389 Encounter for screening for other disorder: Secondary | ICD-10-CM | POA: Diagnosis not present

## 2018-03-27 DIAGNOSIS — I509 Heart failure, unspecified: Secondary | ICD-10-CM | POA: Diagnosis not present

## 2018-03-27 DIAGNOSIS — G4700 Insomnia, unspecified: Secondary | ICD-10-CM | POA: Diagnosis not present

## 2018-03-27 DIAGNOSIS — M859 Disorder of bone density and structure, unspecified: Secondary | ICD-10-CM | POA: Diagnosis not present

## 2018-03-27 DIAGNOSIS — I480 Paroxysmal atrial fibrillation: Secondary | ICD-10-CM | POA: Diagnosis not present

## 2018-03-27 DIAGNOSIS — Z681 Body mass index (BMI) 19 or less, adult: Secondary | ICD-10-CM | POA: Diagnosis not present

## 2018-03-27 DIAGNOSIS — I2510 Atherosclerotic heart disease of native coronary artery without angina pectoris: Secondary | ICD-10-CM | POA: Diagnosis not present

## 2018-03-27 DIAGNOSIS — I10 Essential (primary) hypertension: Secondary | ICD-10-CM | POA: Diagnosis not present

## 2018-03-27 DIAGNOSIS — N183 Chronic kidney disease, stage 3 (moderate): Secondary | ICD-10-CM | POA: Diagnosis not present

## 2018-03-27 DIAGNOSIS — E7849 Other hyperlipidemia: Secondary | ICD-10-CM | POA: Diagnosis not present

## 2018-05-02 ENCOUNTER — Other Ambulatory Visit (HOSPITAL_COMMUNITY): Payer: Self-pay | Admitting: Cardiology

## 2018-06-01 ENCOUNTER — Other Ambulatory Visit: Payer: Self-pay | Admitting: Cardiology

## 2018-06-02 ENCOUNTER — Other Ambulatory Visit: Payer: Self-pay | Admitting: Nurse Practitioner

## 2018-06-05 ENCOUNTER — Other Ambulatory Visit: Payer: Self-pay

## 2018-06-05 ENCOUNTER — Ambulatory Visit (HOSPITAL_COMMUNITY)
Admission: RE | Admit: 2018-06-05 | Discharge: 2018-06-05 | Disposition: A | Discharge status: Home / Self Care | Payer: Medicare Other | Source: Ambulatory Visit | Attending: Cardiology | Admitting: Cardiology

## 2018-06-05 ENCOUNTER — Telehealth: Payer: Self-pay | Admitting: Cardiology

## 2018-06-05 VITALS — BP 116/78 | HR 109 | Wt 121.8 lb

## 2018-06-05 DIAGNOSIS — I2510 Atherosclerotic heart disease of native coronary artery without angina pectoris: Secondary | ICD-10-CM | POA: Insufficient documentation

## 2018-06-05 DIAGNOSIS — N183 Chronic kidney disease, stage 3 (moderate): Secondary | ICD-10-CM | POA: Diagnosis not present

## 2018-06-05 DIAGNOSIS — E785 Hyperlipidemia, unspecified: Secondary | ICD-10-CM | POA: Diagnosis not present

## 2018-06-05 DIAGNOSIS — I480 Paroxysmal atrial fibrillation: Secondary | ICD-10-CM

## 2018-06-05 DIAGNOSIS — Z951 Presence of aortocoronary bypass graft: Secondary | ICD-10-CM | POA: Insufficient documentation

## 2018-06-05 DIAGNOSIS — G14 Postpolio syndrome: Secondary | ICD-10-CM | POA: Insufficient documentation

## 2018-06-05 DIAGNOSIS — I4891 Unspecified atrial fibrillation: Secondary | ICD-10-CM | POA: Insufficient documentation

## 2018-06-05 DIAGNOSIS — G4733 Obstructive sleep apnea (adult) (pediatric): Secondary | ICD-10-CM | POA: Insufficient documentation

## 2018-06-05 DIAGNOSIS — Z8673 Personal history of transient ischemic attack (TIA), and cerebral infarction without residual deficits: Secondary | ICD-10-CM | POA: Diagnosis not present

## 2018-06-05 DIAGNOSIS — I5032 Chronic diastolic (congestive) heart failure: Secondary | ICD-10-CM | POA: Insufficient documentation

## 2018-06-05 DIAGNOSIS — Z79899 Other long term (current) drug therapy: Secondary | ICD-10-CM | POA: Diagnosis not present

## 2018-06-05 DIAGNOSIS — Z7901 Long term (current) use of anticoagulants: Secondary | ICD-10-CM | POA: Diagnosis not present

## 2018-06-05 DIAGNOSIS — Z955 Presence of coronary angioplasty implant and graft: Secondary | ICD-10-CM | POA: Insufficient documentation

## 2018-06-05 DIAGNOSIS — Z9049 Acquired absence of other specified parts of digestive tract: Secondary | ICD-10-CM | POA: Diagnosis not present

## 2018-06-05 DIAGNOSIS — Z87891 Personal history of nicotine dependence: Secondary | ICD-10-CM | POA: Diagnosis not present

## 2018-06-05 DIAGNOSIS — K219 Gastro-esophageal reflux disease without esophagitis: Secondary | ICD-10-CM | POA: Insufficient documentation

## 2018-06-05 DIAGNOSIS — M419 Scoliosis, unspecified: Secondary | ICD-10-CM | POA: Insufficient documentation

## 2018-06-05 DIAGNOSIS — I252 Old myocardial infarction: Secondary | ICD-10-CM | POA: Diagnosis not present

## 2018-06-05 LAB — BASIC METABOLIC PANEL
Anion gap: 8 (ref 5–15)
BUN: 14 mg/dL (ref 6–20)
CALCIUM: 10 mg/dL (ref 8.9–10.3)
CO2: 31 mmol/L (ref 22–32)
CREATININE: 1.52 mg/dL — AB (ref 0.61–1.24)
Chloride: 103 mmol/L (ref 101–111)
GFR calc non Af Amer: 42 mL/min — ABNORMAL LOW (ref >60)
GFR, EST AFRICAN AMERICAN: 48 mL/min — AB (ref >60)
GLUCOSE: 91 mg/dL (ref 65–99)
Potassium: 3.2 mmol/L — ABNORMAL LOW (ref 3.5–5.1)
Sodium: 142 mmol/L (ref 135–145)

## 2018-06-05 LAB — MAGNESIUM: Magnesium: 2.3 mg/dL (ref 1.7–2.4)

## 2018-06-05 LAB — CBC
HCT: 49.1 % (ref 39.0–52.0)
Hemoglobin: 15.7 g/dL (ref 13.0–17.0)
MCH: 30.5 pg (ref 26.0–34.0)
MCHC: 32 g/dL (ref 30.0–36.0)
MCV: 95.5 fL (ref 78.0–100.0)
Platelets: 171 10*3/uL (ref 150–400)
RBC: 5.14 MIL/uL (ref 4.22–5.81)
RDW: 14.7 % (ref 11.5–15.5)
WBC: 6.6 10*3/uL (ref 4.0–10.5)

## 2018-06-05 NOTE — Telephone Encounter (Signed)
New message   Katrina from Dr Claris Gladden office calling on behalf of the patient. Patient is requesting CPAP titration be scheduled.

## 2018-06-05 NOTE — Patient Instructions (Addendum)
Labs drawn today (if we do not call you, then your lab work was stable)   [name] office will call you, [name] for titration.   Your physician recommends that you schedule a follow-up appointment in: Thursday for EKG take all medications  Your physician recommends that you schedule a follow-up appointment in: 6 months with [name]  Please Call an Schedule Appointment (call in September).

## 2018-06-05 NOTE — Progress Notes (Signed)
Patient ID: [name], male   DOB: 12-Aug-1936, 82 y.o.   MRN: [id number] PCP: [name] Cardiology: [name]  82 y.o. with history of CAD s/p CABG presents for cardiology followup.  His last cath was in 11/09.  The SVG-distal RCA was patent, the CFX system was patent.  His LIMA was atretic and there were serial 60% and 80% stenoses in the native LAD.  He was managed medically.  Echo in 8/14 showed EF 55-60% with moderate MR and moderate TR.   He was initially noted to have atrial fibrillation in the summer of 2014. He was started on Xarelto and cardioverted to NSR in 8/14.  Recurrent atrial fibrillation was noted in 1/15, and he was cardioverted to NSR again.  This time, NSR did not hold long. By 5/15, he was in persistent atrial fibrillation.  I referred him to [facility] where he had atrial fibrillation ablation and Tikosyn loading.  Ranolazine was stopped due to risk of QT prolongation and Imdur was started as an anti-anginal instead.  Lexiscan Cardiolite in 11/15 showed no ischemia.    CTA chest done in 2/16 to look for evidence for PV stenosis post-AF ablation.  This showed mild short-segment narrowing of the left inferior pulmonary vein.   Patient was admitted in 5/18 with acute on chronic diastolic CHF.  [place] had [place] the beach [place] and ate out a number of times, probably getting a significant sodium load.  No chest pain.  He was in normal sinus rhythm.  He was started on IV Lasix and diuresed.  Echo in 5/18 showed EF 65-70% with moderately dilated RV and normal systolic function, PASP 57 mmHg.  Lexiscan Cardiolite in 6/18 showed no significant perfusion defect.   Patient returns for followup of CHF and atrial fibrillation today.  Today, he appears to be in an ectopic atrial rhythm with rate around 100.  He has not noted palpitations.  He has stable dyspnea, generally only with heavy exertion. No orthopnea/PND.  No BRBPR/melena.  No chest pain.  Sleep study showed severe OSA, he needs  CPAP titration.   ECG: Personally reviewed.  Ectopic atrial rhythm rate 102  Labs (8/12): K 4.1, creatinine 1.2, LDL 91, HDL 53 Labs (8/14): K 4.6, creatinine 1.1 Labs (11/14): K 4.6, creatinine 1.2, LDL 91, HDL 56 Labs (3/15): AST 38, ALT 32, TSH normal, BNP 237 Labs (7/15): LDL 96, HDL 48, K 4.2, creatinine 1.2, TSH normal, BNP 237, AST 38, ALT 32 Labs (8/15): LFTs normal Labs (11/15): K 4.2, creatinine 1.1, Mg 2.2, BNP 227 Labs (2/16): K 4, creatinine 1.32, BNP 261 Labs (6/18): K 3.9, creatinine 1.6 Labs (7/18): K 3.9, creatinine 1.5, BNP 242 Labs (12/18): K 3.8, creatinine 1.4, hgb 13.9, LDL 73, HDL 52 Labs (3/19): K 3.7, creatinine 1.4, LDL 76, HDL 42  PMH: 1. CAD: 1st MI in 1988.  CABG 1996.  PCI to CFX in 2009.  LHC (11/09) SVG-dRCA patent, total occlusion RCA, patent CFX stent, atretic LIMA, serial 60 and 80% proximal LAD stenoses, EF 60% with basal inferior hypokinesis.  Myoview in 2011 with no ischemia or infarction.  Echo (10/12) with EF 55-60%, mild LVH, mild MR.  Lexiscan Cardiolite in 2013 with no ischemia or infarction.  Echo (8/14) with EF 55-60%, moderate MR, moderate TR, PA systolic pressure 35 mmHg.  Echo (4/15) with EF 60-65%, mild focal basal septal hypertrophy, inferior basal akinesis, mild MR.  Lexiscan cardiolite (11/15) with EF 61%, fixed basal inferoseptal defect,  no ischemia.  - Lexiscan Cardiolite (6/18): EF 54%, no perfusion defect.  2. Raynauds 3. Post-polio syndrome 4. GERD with dilation of esophageal stricture in 12/12.  5. Hyperlipidemia 6. Brainstem stroke with Horner's syndrome.  7. Scoliosis. 8. H/o appendectomy 9. Herpes Zoster 10. Atrial fibrillation: DCCV to NSR in 8/14. DCCV to NSR in 1/15. Atrial fibrillation ablation 6/15 with Tikosyn loading (at [facility]).   11. PFTs (4/15) with FVC 59%, FEV1 54%, ratio 91%, DLCO 53% => moderate obstructive defect thought to be related to COPD and severe restrictive defect thought to be due to elevated left  hemidiaphragm and post-polio syndrome.  12. Chronic diastolic CHF.  Echo (5/18) with EF 65-70%, RV moderately dilated with normal systolic function, PASP 57 mmHg.  13. CKD: Stage 3.  14. OSA  SH: quit tobacco while in his 78s.  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   FH: Brother with CAD, heart transplant.   ROS: All systems reviewed and negative except as per HPI.   Current Outpatient Medications  Medication Sig Dispense Refill  . acetaminophen (TYLENOL) 500 MG tablet Take 500 mg by mouth every 12 (twelve) hours.    [name] albuterol (PROAIR HFA) 108 (90 BASE) MCG/ACT inhaler Inhale 2 puffs into the lungs every 4 (four) hours as needed for wheezing or shortness of breath (and prior to exercise). 1 Inhaler 3  . AMBULATORY NON FORMULARY MEDICATION Testosterone cream 150mg /15% Place 15% onto the skin daily    . cetirizine (ZYRTEC) 10 MG tablet Take 10 mg by mouth.    . diltiazem (CARDIZEM CD) 120 MG 24 hr capsule TAKE ONE CAPSULE BY MOUTH ONCE EACH DAY. 90 capsule 3  . dofetilide (TIKOSYN) 250 MCG capsule TAKE 1 CAPSULE BY MOUTH EVERY 12 HOURS *NEED OFFICE VISIT* 60 capsule 9  . ezetimibe (ZETIA) 10 MG tablet TAKE 1 TABLET BY MOUTH EVERY DAY 90 tablet 0  . furosemide (LASIX) 40 MG tablet Take 40 mg by mouth daily. Take 20 mg in PM if needed    . lactobacillus acidophilus & bulgar (LACTINEX) chewable tablet Chew 2 tablets by mouth daily.     [name] loperamide (IMODIUM) 2 MG capsule Take by mouth as needed for diarrhea or loose stools.    [name] LORazepam (ATIVAN) 0.5 MG tablet Take 0.5 mg by mouth at bedtime as needed for sleep.     . methocarbamol (ROBAXIN) 500 MG tablet Take 500 mg by mouth as needed for muscle spasms.    . potassium chloride SA (KLOR-CON M20) 20 MEQ tablet Take 1 tablet (20 mEq total) by mouth 2 (two) times daily. 180 tablet 3  . Probiotic Product (ALIGN) 4 MG CAPS Take 4 mg by mouth daily.    . ranitidine (ZANTAC) 150 MG tablet Take 2 tablets (300 mg total) 2 (two) times daily by  mouth. 120 tablet 11  . Rivaroxaban (XARELTO) 15 MG TABS tablet Take 1 tablet (15 mg total) by mouth every evening. 90 tablet 3  . rosuvastatin (CRESTOR) 40 MG tablet Take 40 mg by mouth every evening.    . traZODone (DESYREL) 50 MG tablet Take 50 mg by mouth at bedtime.    . Vitamin D, Ergocalciferol, (DRISDOL) 50000 UNITS CAPS capsule Take 50,000 Units by mouth every 14 (fourteen) days. Take on 1st and 15th    . BYSTOLIC 10 MG tablet TAKE 1 TABLET BY MOUTH EVERY DAY 30 tablet 8   No current facility-administered medications for this encounter.     BP 130/68  Pulse 66  Ht 5\' 6"  (1.676 m)  Wt 60.328 kg (133 lb)  BMI 21.47 kg/m2 General: NAD Neck: No JVD, no thyromegaly or thyroid nodule.  Lungs: Clear to auscultation bilaterally with normal respiratory effort. CV: Nondisplaced PMI.  Heart mildly tachy regular S1/S2, no S3/S4, no murmur.  No peripheral edema.  No carotid bruit.  Normal pedal pulses.  Abdomen: Soft, nontender, no hepatosplenomegaly, no distention.  Skin: Intact without lesions or rashes.  Neurologic: Alert and oriented x 3.  Psych: Normal affect. Extremities: No clubbing or cyanosis.  HEENT: Normal.   Assessment/Plan: 1. Atrial fibrillation: He has been on Tikosyn and is s/p atrial fibrillation ablation at [facility] on 06/25/14.  He is not symptomatic but appears to be in an ectopic atrial rhythm today.   - Continue Tikosyn.     - Continue Xarelto and nebivolol.  - Check CBC given Xarelto use.  - Repeat ECG on Thursday. If he remains out of rhythm would arrange for DCCV.  2. CAD: He is not taking aspirin as he has stable CAD and is on Xarelto.  Continue nebivolol and Crestor.  Cardiolite (6/18) showed no ischemia or infarction.  No chest pain.  3. Hyperlipidemia: He is taking Crestor 40 mg daily and Zetia 10 mg daily.  Good lipids recently.   4. Chronic diastolic CHF: NYHA class II symptoms, stable.  He does not appear significantly volume overloaded on exam, but still has  some exertional dyspnea (may be due mainly to elevated left hemidiaphragm and post-polio syndrome).  - Continue Lasix 40 qd   - BMET today.  5. Pulmonary: PFTs suggested both a restrictive and obstructive defect.  The restrictive defect is likely from post-polio syndrome and elevated left hemidiaphragm.  Mr Gaffin never smoked much, so the obstructive defect is more surprising.  6. OSA: Severe, likely potentiates his atrial fibrillation.  He needs this treated.  - Need to arrange for CPAP titrate.    Followup in 6 months    [name] 06/05/2018

## 2018-06-08 ENCOUNTER — Ambulatory Visit (HOSPITAL_COMMUNITY)
Admission: RE | Admit: 2018-06-08 | Discharge: 2018-06-08 | Disposition: A | Discharge status: Home / Self Care | Payer: Medicare Other | Source: Ambulatory Visit | Attending: Internal Medicine | Admitting: Internal Medicine

## 2018-06-08 VITALS — BP 126/72 | HR 64 | Wt 124.4 lb

## 2018-06-08 DIAGNOSIS — I493 Ventricular premature depolarization: Secondary | ICD-10-CM

## 2018-06-08 DIAGNOSIS — I509 Heart failure, unspecified: Secondary | ICD-10-CM | POA: Insufficient documentation

## 2018-06-08 MED ORDER — POTASSIUM CHLORIDE CRYS ER 20 MEQ PO TBCR: EXTENDED_RELEASE_TABLET | ORAL | 3 refills | Status: DC

## 2018-06-08 NOTE — Telephone Encounter (Signed)
CPAP titration sent to sleep pool

## 2018-06-08 NOTE — Telephone Encounter (Signed)
Please let patient know that they have sleep apnea and recommend full night CPAP titration in lab as there was inadequate time for full titration. Please set up titration in the sleep lab.

## 2018-06-08 NOTE — Patient Instructions (Signed)
INCREASE Potassium to 40 meq twice a day for two days, then resume normal dose of 20 meq twice a day  Labs are needed in 7-10 days  Your EKG was in sinus rhythm today.

## 2018-06-08 NOTE — Progress Notes (Signed)
Patient presents today for repeat EKG as ordered by Dr Aundra Dubin  EKG- sinus rhythm  Lab results addressed with patient, advised to increase potassium to 40 meq BID x 2 days then resume 20 meq BID. Repeat bmet needed in 7-10 days

## 2018-06-09 ENCOUNTER — Telehealth: Payer: Self-pay | Admitting: *Deleted

## 2018-06-09 DIAGNOSIS — G4733 Obstructive sleep apnea (adult) (pediatric): Secondary | ICD-10-CM

## 2018-06-09 NOTE — Telephone Encounter (Signed)
-----   Message from Freada Bergeron, [place] sent at 06/08/2018  5:38 PM EDT ----- Regarding: pre cert CPAP Titration

## 2018-06-09 NOTE — Telephone Encounter (Signed)
Staff message sent to [name] patient has traditional medicare. He will not need a PA. Ok to schedule CPAP titration study.

## 2018-06-15 ENCOUNTER — Telehealth: Payer: Self-pay | Admitting: *Deleted

## 2018-06-15 ENCOUNTER — Ambulatory Visit (HOSPITAL_COMMUNITY)
Admission: RE | Admit: 2018-06-15 | Discharge: 2018-06-15 | Disposition: A | Discharge status: Home / Self Care | Payer: Medicare Other | Source: Ambulatory Visit | Attending: Cardiology | Admitting: Cardiology

## 2018-06-15 ENCOUNTER — Encounter: Payer: Self-pay | Admitting: *Deleted

## 2018-06-15 DIAGNOSIS — I493 Ventricular premature depolarization: Secondary | ICD-10-CM | POA: Insufficient documentation

## 2018-06-15 LAB — BASIC METABOLIC PANEL
Anion gap: 5 (ref 5–15)
BUN: 20 mg/dL (ref 6–20)
CHLORIDE: 105 mmol/L (ref 101–111)
CO2: 29 mmol/L (ref 22–32)
CREATININE: 1.63 mg/dL — AB (ref 0.61–1.24)
Calcium: 10.1 mg/dL (ref 8.9–10.3)
GFR calc Af Amer: 44 mL/min — ABNORMAL LOW (ref >60)
GFR, EST NON AFRICAN AMERICAN: 38 mL/min — AB (ref >60)
GLUCOSE: 140 mg/dL — AB (ref 65–99)
Potassium: 4.1 mmol/L (ref 3.5–5.1)
SODIUM: 139 mmol/L (ref 135–145)

## 2018-06-15 NOTE — Telephone Encounter (Signed)
Patient is scheduled for lab study on 07/05/18. Patient understands his titration study will be done at [facility] sleep lab. Patient understands he will receive a sleep packet in a week or so. Patient understands to call if he does not receive the sleep packet in a timely manner. Patient agrees with treatment and thanked me for call.

## 2018-06-15 NOTE — Telephone Encounter (Addendum)
Patient is scheduled for lab study on 07/05/18. Patient understands his titration study will be done at [facility] sleep lab. Patient understands he will receive a sleep packet in a week or so. Patient understands to call if he does not receive the sleep packet in a timely manner. Patient agrees with treatment and thanked me for call.

## 2018-07-05 ENCOUNTER — Ambulatory Visit (HOSPITAL_BASED_OUTPATIENT_CLINIC_OR_DEPARTMENT_OTHER): Payer: Medicare Other | Attending: Cardiology | Admitting: Cardiology

## 2018-07-05 VITALS — Ht 66.0 in | Wt 120.0 lb

## 2018-07-05 DIAGNOSIS — G4733 Obstructive sleep apnea (adult) (pediatric): Secondary | ICD-10-CM | POA: Diagnosis not present

## 2018-07-06 ENCOUNTER — Other Ambulatory Visit (HOSPITAL_BASED_OUTPATIENT_CLINIC_OR_DEPARTMENT_OTHER): Payer: Self-pay

## 2018-07-06 DIAGNOSIS — G4733 Obstructive sleep apnea (adult) (pediatric): Secondary | ICD-10-CM

## 2018-07-07 NOTE — Procedures (Signed)
Patient Name: [name], [place] Date: 07/05/2018 Gender: Male D.O.B: 10/02/1936 Age (years): 80 Referring Provider: [name] Height (inches): 51 Interpreting Physician: Fransico Him MD, ABSM Weight (lbs): 120 RPSGT: Carolin Coy BMI: 19 MRN: [id number] Neck Size: 13.50  CLINICAL INFORMATION The patient is referred for a split night study with BPAP. Most recent polysomnogram dated 01/10/2018 revealed an AHI of 37.2/h and RDI of 39.5/h. Most recent titration study dated 01/10/2018 was optimal at 8cm H2O with an AHI of 8.2/h.  MEDICATIONS Medications self-administered by patient taken the night of the study : TYLENOL, ATIVAN, ROBAXIN, ZANTAC, CRESTOR, TRAZODONE, ALBUTEROL  SLEEP STUDY TECHNIQUE As per the AASM Manual for the Scoring of Sleep and Associated Events v2.3 (April 2016) with a hypopnea requiring 4% desaturations.  The channels recorded and monitored were frontal, central and occipital EEG, electrooculogram (EOG), submentalis EMG (chin), nasal and oral airflow, thoracic and abdominal wall motion, anterior tibialis EMG, snore microphone, electrocardiogram, and pulse oximetry. Bi-level positive airway pressure (BiPAP) was initiated when the patient met split night criteria and was titrated according to treat sleep-disordered breathing.  RESPIRATORY PARAMETERS Diagnostic Total AHI (/hr): 11.5  RDI (/hr):16.8  OA Index (/hr): 4.5  CA Index (/hr): 0.0 REM AHI (/hr): 47.2  NREM AHI (/hr):8.1  Supine AHI (/hr):11.5  Non-supine AHI (/hr): N/A Min O2 Sat (%):83.0  Mean O2 (%): 95.7  Time below 88% (min):6.4   Titration Optimal IPAP Pressure (cm): 21  Optimal EPAP Pressure (cm):17  AHI at Optimal Pressure (/hr):5.7  Min O2 at Optimal Pressure (%):92.0 Sleep % at Optimal (%):82  Supine % at Optimal (%):100   SLEEP ARCHITECTURE The study was initiated at 10:14:49 PM and terminated at 5:00:39 AM. The total recorded time was 405.8 minutes. EEG confirmed total sleep  time was 343 minutes yielding a sleep efficiency of 84.5%%. Sleep onset after lights out was 13.8 minutes with a REM latency of 120.0 minutes. The patient spent 23.9%% of the night in stage N1 sleep, 67.2%% in stage N2 sleep, 0.0%% in stage N3 and 8.89% in REM. Wake after sleep onset (WASO) was 49.0 minutes. The Arousal Index was 26.2/hour.  LEG MOVEMENT DATA The total Periodic Limb Movements of Sleep (PLMS) were 0. The PLMS index was 0.0 .  CARDIAC DATA The 2 lead EKG demonstrated atrial fibrillation. The mean heart rate was 100.0 beats per minute. Other EKG findings include: PVCs.  IMPRESSIONS - Mild obstructive sleep apnea occurred during the diagnostic portion of the study (AHI = 11.5 /hour). An optimal PAP pressure was selected for this patient ( 21 / cm of water) - No significant central sleep apnea occurred during the diagnostic portion of the study (CAI = 0.0/hour). - The patient snored with moderate snoring volume during the diagnostic portion of the study. - EKG findings include PVCs. - Clinically significant periodic limb movements of sleep did not occur during the study.  DIAGNOSIS - Obstructive Sleep Apnea (327.23 [G47.33 ICD-10])  RECOMMENDATIONS - As patient did not tolerate CPAP due to ongoing respiratory events, recommend a trial of auto BiPAP with IPAP max 20cm H2O and EPAP min 6cm H2O and PS of 4cm H2O with a Small size Resmed Full Face Mask AirFit F30 mask and heated humidification. - Avoid alcohol, sedatives and other CNS depressants that may worsen sleep apnea and disrupt normal sleep architecture. - Sleep hygiene should be reviewed to assess factors that may improve sleep quality. - Weight management and regular exercise should be initiated or continued. - Return to  Sleep Center for re-evaluation in 10 weeks.  [Electronically signed] 07/07/2018 06:57 PM  Fransico Him MD, ABSM Diplomate, American Board of Sleep Medicine

## 2018-07-20 ENCOUNTER — Telehealth: Payer: Self-pay | Admitting: *Deleted

## 2018-07-20 ENCOUNTER — Encounter: Payer: Self-pay | Admitting: *Deleted

## 2018-07-20 DIAGNOSIS — G4733 Obstructive sleep apnea (adult) (pediatric): Secondary | ICD-10-CM

## 2018-07-20 NOTE — Telephone Encounter (Signed)
-----   Message from Sueanne Margarita, MD sent at 07/07/2018  7:03 PM EDT ----- Please let patient know that they have significant sleep apnea and had successful PAP titration and will be set up with PAP unit.  Please let DME know that order is in EPIC.  Please set patient up for OV in 10 weeks

## 2018-07-20 NOTE — Telephone Encounter (Signed)
Informed patient of sleep study results and patient understanding was verbalized. Patient understands his sleep study showed they have significant sleep apnea and had successful PAP titration and will be set up with PAP unit.  Upon patient request DME selection is [facility] Patient understands he will be contacted by [facility] to set up his cpap. Patient understands to call if [facility] does not contact him with new setup in a timely manner. Patient understands they will be called once confirmation has been received from [facility] that they have received their new machine to schedule 10 week follow up appointment.  [facility] notified of new cpap order  Please add to airview Patient was grateful for the call and thanked me. 

## 2018-07-20 NOTE — Telephone Encounter (Signed)
  Sueanne Margarita, MD  Freada Bergeron, CMA        Please get an overnight pulse ox on BiPAP    This encounter was created in error - please disregard.

## 2018-07-20 NOTE — Telephone Encounter (Signed)
-----   Message from Sueanne Margarita, MD sent at 07/07/2018  7:04 PM EDT ----- Please get an overnight pulse ox on BiPAP

## 2018-07-20 NOTE — Telephone Encounter (Signed)
Order faxed to [facility]. 

## 2018-07-20 NOTE — Telephone Encounter (Signed)
     Jimmy Margarita, MD  Freada Bergeron, CMA                  ----- Message from Jimmy Margarita, MD sent at 07/07/2018  7:04 PM EDT ----- Please get an overnight pulse ox on BiPAP

## 2018-07-29 ENCOUNTER — Other Ambulatory Visit (HOSPITAL_COMMUNITY): Payer: Self-pay | Admitting: Cardiology

## 2018-08-07 DIAGNOSIS — R0902 Hypoxemia: Secondary | ICD-10-CM | POA: Diagnosis not present

## 2018-08-07 DIAGNOSIS — J449 Chronic obstructive pulmonary disease, unspecified: Secondary | ICD-10-CM | POA: Diagnosis not present

## 2018-08-16 NOTE — Telephone Encounter (Signed)
Per dpr wife/patient understands has nocturnal hypoxemia despite BiPAP and we will add 02 at 2L via BiPAP nightly and repeat overnight pulse ox on BiPAP and O2. Wife states he has NOT gotten use to wearing his Bipap machine at night.  Wife reports he consciously and unconsciously takes his bipap off during the night. Wife states patient is getting less sleep now with the Bipap than he did before he got it. She became upset when talking about how she does not think it is helping the patient. Wife states she will speak to the patient about the changes and recommendations given by doctor Turner.

## 2018-08-16 NOTE — Telephone Encounter (Signed)
Patient has a 10 week follow up appointment scheduled for .10/21. 2019 at 11:40.. Patient understands he needs to keep this appointment for insurance compliance. Patient was grateful for the call and thanked me.

## 2018-08-21 NOTE — Telephone Encounter (Signed)
Please make an appt with DME to see if getting a 2 week mask desensitization would help

## 2018-08-22 NOTE — Addendum Note (Signed)
Addended by: Freada Bergeron on: 08/22/2018 01:18 PM   Modules accepted: Orders

## 2018-08-22 NOTE — Addendum Note (Signed)
Addended by: Freada Bergeron on: 08/22/2018 01:45 PM   Modules accepted: Orders

## 2018-08-22 NOTE — Telephone Encounter (Signed)
Per [facility] a desensitization test may help the patient.  Order placed to [facility]

## 2018-08-25 ENCOUNTER — Other Ambulatory Visit (HOSPITAL_COMMUNITY): Payer: Self-pay | Admitting: Cardiology

## 2018-09-05 ENCOUNTER — Telehealth: Payer: Self-pay | Admitting: *Deleted

## 2018-09-05 NOTE — Telephone Encounter (Signed)
Patient notified that his mask appears to have a large leak. Patient understands Dr Radford Pax ask him to  please take his mask and machine to DME to evaluate . Patient states he took his machine to [facility] already and the necessary adjustments have been made and he has been getting better sleep each night

## 2018-09-05 NOTE — Telephone Encounter (Signed)
-----   Message from Sueanne Margarita, MD sent at 08/31/2018  3:27 PM EDT ----- Mask appears to have a large leak - please have patient take his mask and machine to DME to evaluate

## 2018-09-25 DIAGNOSIS — E291 Testicular hypofunction: Secondary | ICD-10-CM | POA: Diagnosis not present

## 2018-09-25 DIAGNOSIS — G4730 Sleep apnea, unspecified: Secondary | ICD-10-CM | POA: Diagnosis not present

## 2018-09-25 DIAGNOSIS — E7849 Other hyperlipidemia: Secondary | ICD-10-CM | POA: Diagnosis not present

## 2018-09-25 DIAGNOSIS — I509 Heart failure, unspecified: Secondary | ICD-10-CM | POA: Diagnosis not present

## 2018-09-25 DIAGNOSIS — I2510 Atherosclerotic heart disease of native coronary artery without angina pectoris: Secondary | ICD-10-CM | POA: Diagnosis not present

## 2018-09-25 DIAGNOSIS — I8390 Asymptomatic varicose veins of unspecified lower extremity: Secondary | ICD-10-CM | POA: Diagnosis not present

## 2018-09-25 DIAGNOSIS — G4700 Insomnia, unspecified: Secondary | ICD-10-CM | POA: Diagnosis not present

## 2018-09-25 DIAGNOSIS — M859 Disorder of bone density and structure, unspecified: Secondary | ICD-10-CM | POA: Diagnosis not present

## 2018-09-25 DIAGNOSIS — N183 Chronic kidney disease, stage 3 (moderate): Secondary | ICD-10-CM | POA: Diagnosis not present

## 2018-09-25 DIAGNOSIS — Z681 Body mass index (BMI) 19 or less, adult: Secondary | ICD-10-CM | POA: Diagnosis not present

## 2018-09-25 DIAGNOSIS — I10 Essential (primary) hypertension: Secondary | ICD-10-CM | POA: Diagnosis not present

## 2018-09-25 DIAGNOSIS — I480 Paroxysmal atrial fibrillation: Secondary | ICD-10-CM | POA: Diagnosis not present

## 2018-09-28 DIAGNOSIS — Z85828 Personal history of other malignant neoplasm of skin: Secondary | ICD-10-CM | POA: Diagnosis not present

## 2018-09-28 DIAGNOSIS — I8391 Asymptomatic varicose veins of right lower extremity: Secondary | ICD-10-CM | POA: Diagnosis not present

## 2018-09-28 DIAGNOSIS — L570 Actinic keratosis: Secondary | ICD-10-CM | POA: Diagnosis not present

## 2018-09-28 DIAGNOSIS — D225 Melanocytic nevi of trunk: Secondary | ICD-10-CM | POA: Diagnosis not present

## 2018-09-28 DIAGNOSIS — L821 Other seborrheic keratosis: Secondary | ICD-10-CM | POA: Diagnosis not present

## 2018-09-28 DIAGNOSIS — L814 Other melanin hyperpigmentation: Secondary | ICD-10-CM | POA: Diagnosis not present

## 2018-10-11 ENCOUNTER — Telehealth: Payer: Self-pay | Admitting: *Deleted

## 2018-10-11 NOTE — Telephone Encounter (Signed)
Informed patient of compliance results and patient understanding was verbalized. Patient is aware and agreeable to AHI being within range at 5.3. Patient is aware and agreeable to being in compliance with machine usage. Patient is aware and agreeable to no change in current pressures.

## 2018-10-11 NOTE — Telephone Encounter (Signed)
-----   Message from Sueanne Margarita, MD sent at 10/09/2018  6:36 PM EDT ----- Good AHI and compliance.  Continue current PAP settings.

## 2018-10-12 DIAGNOSIS — Z23 Encounter for immunization: Secondary | ICD-10-CM | POA: Diagnosis not present

## 2018-10-16 ENCOUNTER — Ambulatory Visit (INDEPENDENT_AMBULATORY_CARE_PROVIDER_SITE_OTHER): Payer: Medicare Other | Admitting: Cardiology

## 2018-10-16 ENCOUNTER — Encounter: Payer: Self-pay | Admitting: Cardiology

## 2018-10-16 DIAGNOSIS — G4733 Obstructive sleep apnea (adult) (pediatric): Secondary | ICD-10-CM | POA: Diagnosis not present

## 2018-10-16 HISTORY — DX: Obstructive sleep apnea (adult) (pediatric): G47.33

## 2018-10-16 NOTE — Patient Instructions (Signed)
Medication Instructions:  Your physician recommends that you continue on your current medications as directed. Please refer to the Current Medication list given to you today.  If you need a refill on your cardiac medications before your next appointment, please call your pharmacy.   Lab work:  If you have labs (blood work) drawn today and your tests are completely normal, you will receive your results only by: [name] MyChart Message (if you have MyChart) OR . A paper copy in the mail If you have any lab test that is abnormal or we need to change your treatment, we will call you to review the results.  Follow-Up:  You have been referred to Dr. Redmond Baseman with [facility] for [facility].   Your physician recommends that you schedule a follow-up appointment in: 3 months with Dr. Radford Pax

## 2018-10-16 NOTE — Progress Notes (Signed)
Cardiology Office Note:    Date:  10/16/2018   ID:  [name], DOB 1936-01-27, MRN [id number]  PCP:  Burnard Bunting, MD  Cardiologist:  No primary care provider on file.    Referring MD: Burnard Bunting, MD   Chief Complaint  Patient presents with  . Sleep Apnea  . Hypertension    History of Present Illness:    [name] is a 82 y.o. male with a hx of Atrial fibrillation, chronic diastolic CHF and CAD who was referred by [name] for sleep study.  He was found to have severe obstructive sleep apnea with an AHI of 37.2/h with moderate oxygen desaturations as low as 82%.  He was titrated on CPAP but due to ongoing respiratory events could not be adequately titrated.  He subsequently underwent BiPAP titration and is now here for follow-up.  He is doing well with his PAP device and thinks that he has gotten used to it.  He tolerates the mask and feels the pressure is adequate.  Since going on PAP he feels rested in the am and has no significant daytime sleepiness.  He denies any significant mouth or nasal dryness or nasal congestion.  He does not think that he snores.     Past Medical History:  Diagnosis Date  . Abnormal nuclear cardiac imaging test March 2011   Has positive EKG response, no perfusion defect and normal EF  . Adenomatous colon polyp 12/2002  . Atrial fibrillation ([place])    a. s/p rfca;  b. chronic tikosyn and xarelto.  . 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 Cataract    bil cataracts removed  . Chronic diastolic CHF (congestive heart failure) ([place])    a. 04/2017 Echo: EF 65-70%, restrictive physiology, mildly dil LA.  [name] COPD (chronic obstructive pulmonary disease) ([place])   . Coronary artery disease    First MI in 1988. PCI in 1996 with subsequent CABG in 1996 due to restenosis. S/P stents to LCX in 2009. Noted to have residual disease in the LAD in a diffuse manner and atretic LIMA graft. He is managed medically.   . Esophageal  stricture   . GERD (gastroesophageal reflux disease)   . 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 History of Raynaud's syndrome   . Hyperlipidemia   . Hypertension   . Internal hemorrhoids   . Myocardial infarction [facility]    MI x1 85 - 58 age  . Neuromuscular disorder ([place])    Polio - age 51  . OSA (obstructive sleep apnea) 10/16/2018   Severe OSA with AHI 37.2/hr on BiPAP  . Persistent atrial fibrillation   . Polio    age 25  . PVC (premature ventricular contraction)   . Scoliosis    mild    Past Surgical History:  Procedure Laterality Date  . ABLATION     done for a fib  . APPENDECTOMY     Age 37  . CARDIOVERSION N/A 08/22/2013   Procedure: CARDIOVERSION;  Surgeon: Carlena Bjornstad, MD;  Location[facility] ENDOSCOPY;  Service: Cardiovascular;  Laterality: N/A;  . CARDIOVERSION N/A 01/16/2014   Procedure: CARDIOVERSION;  Surgeon: Lelon Perla, MD;  Location: [facility] ENDOSCOPY;  Service: Cardiovascular;  Laterality: N/A;  . COLONOSCOPY  2008  . CORONARY ARTERY BYPASS GRAFT  1996   with a LIMA to the LAD, SVG to RCA and SVG to OM  . CORONARY STENT PLACEMENT  2009  LCX  . POLYPECTOMY    . TONSILLECTOMY    . UPPER GASTROINTESTINAL ENDOSCOPY      Current Medications: No outpatient medications have been marked as taking for the 10/16/18 encounter (Office Visit) with Jimmy Margarita, MD.     Allergies:   Morphine and related   Social History   Socioeconomic History  . Marital status: Married    Spouse name: Not on file  . Number of children: 2  . Years of education: Not on file  . Highest education level: Not on file  Occupational History  . Occupation: Software engineer  Social Needs  . Financial resource strain: Not on file  . Food insecurity:    Worry: Not on file    Inability: Not on file  . Transportation needs:    Medical: Not on file    Non-medical: Not on file  Tobacco Use  . Smoking status: Former Smoker    Packs/day: 0.50    Years: 5.00    Pack years:  2.50    Types: Cigarettes    Last attempt to quit: 12/27/1966    Years since quitting: 51.8  . Smokeless tobacco: Former Systems developer    Quit date: 1950  Substance and Sexual Activity  . Alcohol use: No  . Drug use: No  . Sexual activity: Yes  Lifestyle  . Physical activity:    Days per week: Not on file    Minutes per session: Not on file  . Stress: Not on file  Relationships  . Social connections:    Talks on phone: Not on file    Gets together: Not on file    Attends religious service: Not on file    Active member of club or organization: Not on file    Attends meetings of clubs or organizations: Not on file    Relationship status: Not on file  Other Topics Concern  . 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 There is no history of Colon cancer, Esophageal cancer, Pancreatic cancer, Rectal cancer, or Stomach cancer.  ROS:   Please see the history of present illness.    ROS  All other systems reviewed and negative.   EKGs/Labs/Other Studies Reviewed:    The following studies were reviewed today: PAP download  EKG:  EKG is not ordered today.   Recent Labs: 06/05/2018: Hemoglobin 15.7; Magnesium 2.3; Platelets 171 06/15/2018: BUN 20; Creatinine, Ser 1.63; Potassium 4.1; Sodium 139   Recent Lipid Panel    Component Value Date/Time   CHOL 148 12/08/2017 1428   TRIG 116 12/08/2017 1428   HDL 52 12/08/2017 1428   CHOLHDL 2.8 12/08/2017 1428   VLDL 23 12/08/2017 1428   LDLCALC 73 12/08/2017 1428    Physical Exam:    VS:  There were no vitals taken for this visit.    Wt Readings from Last 3 Encounters:  07/05/18 120  lb (54.4 kg)  06/08/18 124 lb 6.4 oz (56.4 kg)  06/05/18 121 lb 12.8 oz (55.2 kg)     GEN:  Well nourished, well developed in no acute distress HEENT: Normal NECK: No JVD; No carotid bruits LYMPHATICS: No lymphadenopathy CARDIAC:  RRR, no murmurs, rubs, gallops RESPIRATORY:  Clear to auscultation without rales, wheezing or rhonchi  ABDOMEN: Soft, non-tender, non-distended MUSCULOSKELETAL:  No edema; No deformity  SKIN: Warm and dry NEUROLOGIC:  Alert and oriented x 3 PSYCHIATRIC:  Normal affect   ASSESSMENT:    1. OSA (obstructive sleep apnea)    PLAN:    In order of problems listed above:  1.  OSA -  The PAP download was reviewed today and showed an AHI of 6.3/hr on auto BIPAP with 23% compliance in using more than 4 hours nightly.  The patient has not been compliant with his PAP therapy.  He recently had skin cancer removed from the bridge of his nose and has not been able to tolerate the mask across his face.  He did not use his device for at least over 10 days because of having the surgery.  He did try to use his device for the first time last night but was still very uncomfortable.  He is tried several full facemask including the DreamWear mask but does not go over the bridge of his nose but he said that it seemed to collapse into his nostrils.  We did discuss the possibility of referring to ENT for consideration of inspire.  He is willing to talk to them about this so I will refer him over to [facility].  In the meantime I have encouraged him to try to be more compliant with his device.  He does have 4 different types of mask at home he is going to try each 1 of them to see which when he can tolerate the most.  I will see him back in 3 months.    Medication Adjustments/Labs and Tests Ordered: Current medicines are reviewed at length with the patient today.  Concerns regarding medicines are outlined above.  No orders of the defined types were placed in this encounter.  No  orders of the defined types were placed in this encounter.   Signed, Fransico Him, MD  10/16/2018 11:43 AM    [place]

## 2018-10-23 ENCOUNTER — Other Ambulatory Visit (HOSPITAL_COMMUNITY): Payer: Self-pay | Admitting: Cardiology

## 2018-12-03 ENCOUNTER — Other Ambulatory Visit (HOSPITAL_COMMUNITY): Payer: Self-pay | Admitting: Cardiology

## 2018-12-07 ENCOUNTER — Ambulatory Visit (HOSPITAL_COMMUNITY)
Admission: RE | Admit: 2018-12-07 | Discharge: 2018-12-07 | Disposition: A | Discharge status: Home / Self Care | Payer: Medicare Other | Source: Ambulatory Visit | Attending: Cardiology | Admitting: Cardiology

## 2018-12-07 ENCOUNTER — Encounter (HOSPITAL_COMMUNITY): Payer: Self-pay | Admitting: Cardiology

## 2018-12-07 ENCOUNTER — Encounter (HOSPITAL_COMMUNITY): Payer: Self-pay

## 2018-12-07 VITALS — BP 132/68 | HR 68 | Wt 122.4 lb

## 2018-12-07 DIAGNOSIS — E785 Hyperlipidemia, unspecified: Secondary | ICD-10-CM | POA: Insufficient documentation

## 2018-12-07 DIAGNOSIS — G4733 Obstructive sleep apnea (adult) (pediatric): Secondary | ICD-10-CM | POA: Diagnosis not present

## 2018-12-07 DIAGNOSIS — I480 Paroxysmal atrial fibrillation: Secondary | ICD-10-CM

## 2018-12-07 DIAGNOSIS — Z8673 Personal history of transient ischemic attack (TIA), and cerebral infarction without residual deficits: Secondary | ICD-10-CM | POA: Diagnosis not present

## 2018-12-07 DIAGNOSIS — Z8249 Family history of ischemic heart disease and other diseases of the circulatory system: Secondary | ICD-10-CM | POA: Insufficient documentation

## 2018-12-07 DIAGNOSIS — I2510 Atherosclerotic heart disease of native coronary artery without angina pectoris: Secondary | ICD-10-CM | POA: Diagnosis not present

## 2018-12-07 DIAGNOSIS — Z7901 Long term (current) use of anticoagulants: Secondary | ICD-10-CM | POA: Insufficient documentation

## 2018-12-07 DIAGNOSIS — Z87891 Personal history of nicotine dependence: Secondary | ICD-10-CM | POA: Insufficient documentation

## 2018-12-07 DIAGNOSIS — I252 Old myocardial infarction: Secondary | ICD-10-CM | POA: Diagnosis not present

## 2018-12-07 DIAGNOSIS — I509 Heart failure, unspecified: Secondary | ICD-10-CM | POA: Diagnosis not present

## 2018-12-07 DIAGNOSIS — Z79899 Other long term (current) drug therapy: Secondary | ICD-10-CM | POA: Insufficient documentation

## 2018-12-07 DIAGNOSIS — I5032 Chronic diastolic (congestive) heart failure: Secondary | ICD-10-CM | POA: Insufficient documentation

## 2018-12-07 DIAGNOSIS — Z951 Presence of aortocoronary bypass graft: Secondary | ICD-10-CM | POA: Insufficient documentation

## 2018-12-07 DIAGNOSIS — I4891 Unspecified atrial fibrillation: Secondary | ICD-10-CM | POA: Diagnosis not present

## 2018-12-07 LAB — CBC
HCT: 50.9 % (ref 39.0–52.0)
Hemoglobin: 15.7 g/dL (ref 13.0–17.0)
MCH: 31 pg (ref 26.0–34.0)
MCHC: 30.8 g/dL (ref 30.0–36.0)
MCV: 100.4 fL — ABNORMAL HIGH (ref 80.0–100.0)
Platelets: 162 10*3/uL (ref 150–400)
RBC: 5.07 MIL/uL (ref 4.22–5.81)
RDW: 13.3 % (ref 11.5–15.5)
WBC: 6.2 10*3/uL (ref 4.0–10.5)
nRBC: 0 % (ref 0.0–0.2)

## 2018-12-07 LAB — LIPID PANEL
CHOLESTEROL: 124 mg/dL (ref 0–200)
HDL: 53 mg/dL (ref >40)
LDL Cholesterol: 62 mg/dL (ref 0–99)
Total CHOL/HDL Ratio: 2.3 RATIO
Triglycerides: 47 mg/dL (ref <150)
VLDL: 9 mg/dL (ref 0–40)

## 2018-12-07 LAB — BASIC METABOLIC PANEL
Anion gap: 11 (ref 5–15)
BUN: 17 mg/dL (ref 8–23)
CO2: 25 mmol/L (ref 22–32)
Calcium: 9.8 mg/dL (ref 8.9–10.3)
Chloride: 102 mmol/L (ref 98–111)
Creatinine, Ser: 1.43 mg/dL — ABNORMAL HIGH (ref 0.61–1.24)
GFR calc Af Amer: 53 mL/min — ABNORMAL LOW (ref >60)
GFR calc non Af Amer: 46 mL/min — ABNORMAL LOW (ref >60)
Glucose, Bld: 97 mg/dL (ref 70–99)
Potassium: 4.3 mmol/L (ref 3.5–5.1)
Sodium: 138 mmol/L (ref 135–145)

## 2018-12-07 NOTE — H&P (View-Only) (Signed)
Patient ID: [name], male   DOB: July 10, 1936, 82 y.o.   MRN: [id number] PCP: [name] Cardiology: [name]  82 y.o. with history of CAD s/p CABG presents for cardiology followup.  His last cath was in 11/09.  The SVG-distal RCA was patent, the CFX system was patent.  His LIMA was atretic and there were serial 60% and 80% stenoses in the native LAD.  He was managed medically.  Echo in 8/14 showed EF 55-60% with moderate MR and moderate TR.   He was initially noted to have atrial fibrillation in the summer of 2014. He was started on Xarelto and cardioverted to NSR in 8/14.  Recurrent atrial fibrillation was noted in 1/15, and he was cardioverted to NSR again.  This time, NSR did not hold long. By 5/15, he was in persistent atrial fibrillation.  I referred him to [facility] where he had atrial fibrillation ablation and Tikosyn loading.  Ranolazine was stopped due to risk of QT prolongation and Imdur was started as an anti-anginal instead.  Lexiscan Cardiolite in 11/15 showed no ischemia.    CTA chest done in 2/16 to look for evidence for PV stenosis post-AF ablation.  This showed mild short-segment narrowing of the left inferior pulmonary vein.   Patient was admitted in 5/18 with acute on chronic diastolic CHF.  He had been at the beach for [place] days and ate out a number of times, probably getting a significant sodium load.  No chest pain.  He was in normal sinus rhythm.  He was started on IV Lasix and diuresed.  Echo in 5/18 showed EF 65-70% with moderately dilated RV and normal systolic function, PASP 57 mmHg.  Lexiscan Cardiolite in 6/18 showed no significant perfusion defect.   Patient returns for followup of CHF and atrial fibrillation today. He is in atrial tachycardia with 2:1 block and HR 68. He does not feel palpitations.  He has been trying CPAP but has had trouble tolerating it.  Stable mild dyspnea with heavy exertion.  No chest pain. No orthopnea/PND.  No BRBPR/melena.   Weight is down  about 10 lbs.   ECG: Personally reviewed.  Ectopic atrial rhythm with 2:1 block rate 68  Labs (8/12): K 4.1, creatinine 1.2, LDL 91, HDL 53 Labs (8/14): K 4.6, creatinine 1.1 Labs (11/14): K 4.6, creatinine 1.2, LDL 91, HDL 56 Labs (3/15): AST 38, ALT 32, TSH normal, BNP 237 Labs (7/15): LDL 96, HDL 48, K 4.2, creatinine 1.2, TSH normal, BNP 237, AST 38, ALT 32 Labs (8/15): LFTs normal Labs (11/15): K 4.2, creatinine 1.1, Mg 2.2, BNP 227 Labs (2/16): K 4, creatinine 1.32, BNP 261 Labs (6/18): K 3.9, creatinine 1.6 Labs (7/18): K 3.9, creatinine 1.5, BNP 242 Labs (12/18): K 3.8, creatinine 1.4, hgb 13.9, LDL 73, HDL 52 Labs (3/19): K 3.7, creatinine 1.4, LDL 76, HDL 42 Labs (6/19): K 4.1, creatinine 1.63  PMH: 1. CAD: 1st MI in 1988.  CABG 1996.  PCI to CFX in 2009.  LHC (11/09) SVG-dRCA patent, total occlusion RCA, patent CFX stent, atretic LIMA, serial 60 and 80% proximal LAD stenoses, EF 60% with basal inferior hypokinesis.  Myoview in 2011 with no ischemia or infarction.  Echo (10/12) with EF 55-60%, mild LVH, mild MR.  Lexiscan Cardiolite in 2013 with no ischemia or infarction.  Echo (8/14) with EF 55-60%, moderate MR, moderate TR, PA systolic pressure 35 mmHg.  Echo (4/15) with EF 60-65%, mild focal basal septal hypertrophy, inferior basal akinesis, mild MR.  Lexiscan cardiolite (11/15) with EF 61%, fixed basal inferoseptal defect, no ischemia.  - Lexiscan Cardiolite (6/18): EF 54%, no perfusion defect.  2. Raynauds syndrome 3. Post-polio syndrome 4. GERD with dilation of esophageal stricture in 12/12.  5. Hyperlipidemia 6. Brainstem stroke with Horner's syndrome.  7. Scoliosis. 8. H/o appendectomy 9. Herpes Zoster 10. Atrial fibrillation: DCCV to NSR in 8/14. DCCV to NSR in 1/15. Atrial fibrillation ablation 6/15 with Tikosyn loading (at [facility]).   11. PFTs (4/15) with FVC 59%, FEV1 54%, ratio 91%, DLCO 53% => moderate obstructive defect thought to be related to COPD and severe  restrictive defect thought to be due to elevated left hemidiaphragm and post-polio syndrome.  12. Chronic diastolic CHF.  Echo (5/18) with EF 65-70%, RV moderately dilated with normal systolic function, PASP 57 mmHg.  13. CKD: Stage 3.  14. OSA  SH: quit tobacco while in his 49s.  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   FH: Brother with CAD, heart transplant.   ROS: All systems reviewed and negative except as per HPI.   Current Outpatient Medications  Medication Sig Dispense Refill  . acetaminophen (TYLENOL) 500 MG tablet Take 500 mg by mouth every 12 (twelve) hours.    [name] albuterol (PROAIR HFA) 108 (90 BASE) MCG/ACT inhaler Inhale 2 puffs into the lungs every 4 (four) hours as needed for wheezing or shortness of breath (and prior to exercise). 1 Inhaler 3  . AMBULATORY NON FORMULARY MEDICATION Testosterone cream 150mg /15% Place 15% onto the skin daily    . B Complex Vitamins (VITAMIN B COMPLEX PO) Take 1 tablet by mouth daily.    [name] BYSTOLIC 10 MG tablet TAKE 1 TABLET BY MOUTH EVERY DAY 30 tablet 8  . cetirizine (ZYRTEC) 10 MG tablet Take 10 mg by mouth.    . diltiazem (CARDIZEM CD) 120 MG 24 hr capsule TAKE ONE CAPSULE BY MOUTH ONCE EACH DAY. 90 capsule 3  . dofetilide (TIKOSYN) 250 MCG capsule TAKE 1 CAPSULE BY MOUTH EVERY 12 HOURS *NEED OFFICE VISIT* 60 capsule 9  . ezetimibe (ZETIA) 10 MG tablet TAKE 1 TABLET BY MOUTH EVERY DAY 90 tablet 0  . furosemide (LASIX) 40 MG tablet Take one tablet two times daily for 5 days, then 1 each morning and 1/2 in the afternoon. (Patient taking differently: 40 mg daily. ) 140 tablet 1  . lactobacillus acidophilus & bulgar (LACTINEX) chewable tablet Chew 2 tablets by mouth daily.     [name] loperamide (IMODIUM) 2 MG capsule Take by mouth as needed for diarrhea or loose stools.    [name] LORazepam (ATIVAN) 0.5 MG tablet Take 0.5 mg by mouth at bedtime as needed for sleep.     . methocarbamol (ROBAXIN) 500 MG tablet Take 500 mg by mouth as needed for muscle  spasms.    . potassium chloride SA (KLOR-CON M20) 20 MEQ tablet Take 2 tablets (40 mEq total) by mouth 2 (two) times daily for 2 days, THEN 1 tablet (20 mEq total) 2 (two) times daily. (Patient taking differently: take 1 tab daily) 180 tablet 3  . Probiotic Product (ALIGN) 4 MG CAPS Take 4 mg by mouth daily.    . ranitidine (ZANTAC) 150 MG tablet Take 2 tablets (300 mg total) 2 (two) times daily by mouth. 120 tablet 11  . Rivaroxaban (XARELTO) 15 MG TABS tablet Take 1 tablet (15 mg total) by mouth every evening. 90 tablet 3  . rosuvastatin (CRESTOR) 40 MG tablet Take 40 mg by mouth every evening.    [name]  traZODone (DESYREL) 50 MG tablet Take 50 mg by mouth at bedtime.    . Vitamin D, Ergocalciferol, (DRISDOL) 50000 UNITS CAPS capsule Take 50,000 Units by mouth every 14 (fourteen) days. Take on 1st and 15th     No current facility-administered medications for this encounter.     BP 130/68  Pulse 66  Ht 5\' 6"  (1.676 m)  Wt 60.328 kg (133 lb)  BMI 21.47 kg/m2 General: NAD Neck: No JVD, no thyromegaly or thyroid nodule.  Lungs: Clear to auscultation bilaterally with normal respiratory effort. CV: Nondisplaced PMI.  Heart regular S1/S2, no S3/S4, no murmur.  No peripheral edema.  No carotid bruit.  Normal pedal pulses.  Abdomen: Soft, nontender, no hepatosplenomegaly, no distention.  Skin: Intact without lesions or rashes.  Neurologic: Alert and oriented x 3.  Psych: Normal affect. Extremities: No clubbing or cyanosis.  HEENT: Normal.   Assessment/Plan: 1. Atrial fibrillation: He has been on Tikosyn and is s/p atrial fibrillation ablation at [facility] on 06/25/14.  He is not symptomatic but appears to be in an ectopic atrial rhythm with 2:1 block today.   - Continue Tikosyn.     - Continue Xarelto and nebivolol.  - Check CBC given Xarelto use.  - Repeat ECG on Monday. If he remains out of sinus rhythm, will arrange for DCCV next week.  2. CAD: He is not taking aspirin as he has stable CAD and is  on Xarelto.  Continue nebivolol and Crestor.  Cardiolite (6/18) showed no ischemia or infarction.  No chest pain.  3. Hyperlipidemia: He is taking Crestor 40 mg daily and Zetia 10 mg daily.  Check lipids today.    4. Chronic diastolic CHF: NYHA class II symptoms, stable.  He does not appear significantly volume overloaded on exam, but still has some exertional dyspnea (may be due mainly to elevated left hemidiaphragm and post-polio syndrome).  - Continue Lasix 40 qd   - BMET today.  - I will obtain an echocardiogram.  5. Pulmonary: PFTs suggested both a restrictive and obstructive defect.  The restrictive defect is likely from post-polio syndrome and elevated left hemidiaphragm.  Mr Guedes never smoked much, so the obstructive defect is more surprising.  6. OSA: Severe, likely potentiates his atrial fibrillation. He has had a hard time tolerating CPAP.   - Will check with Dr. Radford Pax to see if oxygen at night would be a suitable alternative.     Followup in 6 months    [name] 12/07/2018

## 2018-12-07 NOTE — Progress Notes (Signed)
Patient ID: [name], male   DOB: 20-Oct-1936, 82 y.o.   MRN: [id number] PCP: [name] Cardiology: [name]  82 y.o. with history of CAD s/p CABG presents for cardiology followup.  His last cath was in 11/09.  The SVG-distal RCA was patent, the CFX system was patent.  His LIMA was atretic and there were serial 60% and 80% stenoses in the native LAD.  He was managed medically.  Echo in 8/14 showed EF 55-60% with moderate MR and moderate TR.   He was initially noted to have atrial fibrillation in the summer of 2014. He was started on Xarelto and cardioverted to NSR in 8/14.  Recurrent atrial fibrillation was noted in 1/15, and he was cardioverted to NSR again.  This time, NSR did not hold long. By 5/15, he was in persistent atrial fibrillation.  I referred him to [facility] where he had atrial fibrillation ablation and Tikosyn loading.  Ranolazine was stopped due to risk of QT prolongation and Imdur was started as an anti-anginal instead.  Lexiscan Cardiolite in 11/15 showed no ischemia.    CTA chest done in 2/16 to look for evidence for PV stenosis post-AF ablation.  This showed mild short-segment narrowing of the left inferior pulmonary vein.   Patient was admitted in 5/18 with acute on chronic diastolic CHF.  He had been [place] the beach for [place] days and ate out a number of times, probably getting a significant sodium load.  No chest pain.  He was in normal sinus rhythm.  He was started on IV Lasix and diuresed.  Echo in 5/18 showed EF 65-70% with moderately dilated RV and normal systolic function, PASP 57 mmHg.  Lexiscan Cardiolite in 6/18 showed no significant perfusion defect.   Patient returns for followup of CHF and atrial fibrillation today. He is in atrial tachycardia with 2:1 block and HR 68. He does not feel palpitations.  He has been trying CPAP but has had trouble tolerating it.  Stable mild dyspnea with heavy exertion.  No chest pain. No orthopnea/PND.  No BRBPR/melena.   Weight is down  about 10 lbs.   ECG: Personally reviewed.  Ectopic atrial rhythm with 2:1 block rate 68  Labs (8/12): K 4.1, creatinine 1.2, LDL 91, HDL 53 Labs (8/14): K 4.6, creatinine 1.1 Labs (11/14): K 4.6, creatinine 1.2, LDL 91, HDL 56 Labs (3/15): AST 38, ALT 32, TSH normal, BNP 237 Labs (7/15): LDL 96, HDL 48, K 4.2, creatinine 1.2, TSH normal, BNP 237, AST 38, ALT 32 Labs (8/15): LFTs normal Labs (11/15): K 4.2, creatinine 1.1, Mg 2.2, BNP 227 Labs (2/16): K 4, creatinine 1.32, BNP 261 Labs (6/18): K 3.9, creatinine 1.6 Labs (7/18): K 3.9, creatinine 1.5, BNP 242 Labs (12/18): K 3.8, creatinine 1.4, hgb 13.9, LDL 73, HDL 52 Labs (3/19): K 3.7, creatinine 1.4, LDL 76, HDL 42 Labs (6/19): K 4.1, creatinine 1.63  PMH: 1. CAD: 1st MI in 1988.  CABG 1996.  PCI to CFX in 2009.  LHC (11/09) SVG-dRCA patent, total occlusion RCA, patent CFX stent, atretic LIMA, serial 60 and 80% proximal LAD stenoses, EF 60% with basal inferior hypokinesis.  Myoview in 2011 with no ischemia or infarction.  Echo (10/12) with EF 55-60%, mild LVH, mild MR.  Lexiscan Cardiolite in 2013 with no ischemia or infarction.  Echo (8/14) with EF 55-60%, moderate MR, moderate TR, PA systolic pressure 35 mmHg.  Echo (4/15) with EF 60-65%, mild focal basal septal hypertrophy, inferior basal akinesis, mild MR.  Lexiscan cardiolite (11/15) with EF 61%, fixed basal inferoseptal defect, no ischemia.  - Lexiscan Cardiolite (6/18): EF 54%, no perfusion defect.  2. Raynauds syndrome 3. Post-polio syndrome 4. GERD with dilation of esophageal stricture in 12/12.  5. Hyperlipidemia 6. Brainstem stroke with Horner's syndrome.  7. Scoliosis. 8. H/o appendectomy 9. Herpes Zoster 10. Atrial fibrillation: DCCV to NSR in 8/14. DCCV to NSR in 1/15. Atrial fibrillation ablation 6/15 with Tikosyn loading (at [facility]).   11. PFTs (4/15) with FVC 59%, FEV1 54%, ratio 91%, DLCO 53% => moderate obstructive defect thought to be related to COPD and severe  restrictive defect thought to be due to elevated left hemidiaphragm and post-polio syndrome.  12. Chronic diastolic CHF.  Echo (5/18) with EF 65-70%, RV moderately dilated with normal systolic function, PASP 57 mmHg.  13. CKD: Stage 3.  14. OSA  SH: quit tobacco while in his 70s.  Married.  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   FH: Brother with CAD, heart transplant.   ROS: All systems reviewed and negative except as per HPI.   Current Outpatient Medications  Medication Sig Dispense Refill  . acetaminophen (TYLENOL) 500 MG tablet Take 500 mg by mouth every 12 (twelve) hours.    [name] albuterol (PROAIR HFA) 108 (90 BASE) MCG/ACT inhaler Inhale 2 puffs into the lungs every 4 (four) hours as needed for wheezing or shortness of breath (and prior to exercise). 1 Inhaler 3  . AMBULATORY NON FORMULARY MEDICATION Testosterone cream 150mg /15% Place 15% onto the skin daily    . B Complex Vitamins (VITAMIN B COMPLEX PO) Take 1 tablet by mouth daily.    [name] BYSTOLIC 10 MG tablet TAKE 1 TABLET BY MOUTH EVERY DAY 30 tablet 8  . cetirizine (ZYRTEC) 10 MG tablet Take 10 mg by mouth.    . diltiazem (CARDIZEM CD) 120 MG 24 hr capsule TAKE ONE CAPSULE BY MOUTH ONCE EACH DAY. 90 capsule 3  . dofetilide (TIKOSYN) 250 MCG capsule TAKE 1 CAPSULE BY MOUTH EVERY 12 HOURS *NEED OFFICE VISIT* 60 capsule 9  . ezetimibe (ZETIA) 10 MG tablet TAKE 1 TABLET BY MOUTH EVERY DAY 90 tablet 0  . furosemide (LASIX) 40 MG tablet Take one tablet two times daily for 5 days, then 1 each morning and 1/2 in the afternoon. (Patient taking differently: 40 mg daily. ) 140 tablet 1  . lactobacillus acidophilus & bulgar (LACTINEX) chewable tablet Chew 2 tablets by mouth daily.     [name] loperamide (IMODIUM) 2 MG capsule Take by mouth as needed for diarrhea or loose stools.    [name] LORazepam (ATIVAN) 0.5 MG tablet Take 0.5 mg by mouth at bedtime as needed for sleep.     . methocarbamol (ROBAXIN) 500 MG tablet Take 500 mg by mouth as needed for muscle  spasms.    . potassium chloride SA (KLOR-CON M20) 20 MEQ tablet Take 2 tablets (40 mEq total) by mouth 2 (two) times daily for 2 days, THEN 1 tablet (20 mEq total) 2 (two) times daily. (Patient taking differently: take 1 tab daily) 180 tablet 3  . Probiotic Product (ALIGN) 4 MG CAPS Take 4 mg by mouth daily.    . ranitidine (ZANTAC) 150 MG tablet Take 2 tablets (300 mg total) 2 (two) times daily by mouth. 120 tablet 11  . Rivaroxaban (XARELTO) 15 MG TABS tablet Take 1 tablet (15 mg total) by mouth every evening. 90 tablet 3  . rosuvastatin (CRESTOR) 40 MG tablet Take 40 mg by mouth every evening.    [name]  traZODone (DESYREL) 50 MG tablet Take 50 mg by mouth at bedtime.    . Vitamin D, Ergocalciferol, (DRISDOL) 50000 UNITS CAPS capsule Take 50,000 Units by mouth every 14 (fourteen) days. Take on 1st and 15th     No current facility-administered medications for this encounter.     BP 130/68  Pulse 66  Ht 5\' 6"  (1.676 m)  Wt 60.328 kg (133 lb)  BMI 21.47 kg/m2 General: NAD Neck: No JVD, no thyromegaly or thyroid nodule.  Lungs: Clear to auscultation bilaterally with normal respiratory effort. CV: Nondisplaced PMI.  Heart regular S1/S2, no S3/S4, no murmur.  No peripheral edema.  No carotid bruit.  Normal pedal pulses.  Abdomen: Soft, nontender, no hepatosplenomegaly, no distention.  Skin: Intact without lesions or rashes.  Neurologic: Alert and oriented x 3.  Psych: Normal affect. Extremities: No clubbing or cyanosis.  HEENT: Normal.   Assessment/Plan: 1. Atrial fibrillation: He has been on Tikosyn and is s/p atrial fibrillation ablation at [facility] on 06/25/14.  He is not symptomatic but appears to be in an ectopic atrial rhythm with 2:1 block today.   - Continue Tikosyn.     - Continue Xarelto and nebivolol.  - Check CBC given Xarelto use.  - Repeat ECG on Monday. If he remains out of sinus rhythm, will arrange for DCCV next week.  2. CAD: He is not taking aspirin as he has stable CAD and is  on Xarelto.  Continue nebivolol and Crestor.  Cardiolite (6/18) showed no ischemia or infarction.  No chest pain.  3. Hyperlipidemia: He is taking Crestor 40 mg daily and Zetia 10 mg daily.  Check lipids today.    4. Chronic diastolic CHF: NYHA class II symptoms, stable.  He does not appear significantly volume overloaded on exam, but still has some exertional dyspnea (may be due mainly to elevated left hemidiaphragm and post-polio syndrome).  - Continue Lasix 40 qd   - BMET today.  - I will obtain an echocardiogram.  5. Pulmonary: PFTs suggested both a restrictive and obstructive defect.  The restrictive defect is likely from post-polio syndrome and elevated left hemidiaphragm.  Mr Lesniak never smoked much, so the obstructive defect is more surprising.  6. OSA: Severe, likely potentiates his atrial fibrillation. He has had a hard time tolerating CPAP.   - Will check with Dr. Radford Pax to see if oxygen at night would be a suitable alternative.     Followup in 6 months    [name] 12/07/2018

## 2018-12-07 NOTE — Patient Instructions (Signed)
Labs done today  EKG done today  Your physician has requested that you have an echocardiogram. Echocardiography is a painless test that uses sound waves to create images of your heart. It provides your doctor with information about the size and shape of your heart and how well your heart's chambers and valves are working. This procedure takes approximately one hour. There are no restrictions for this procedure.  Follow up with [name] in 6 months

## 2018-12-11 ENCOUNTER — Other Ambulatory Visit: Payer: Self-pay

## 2018-12-11 ENCOUNTER — Encounter (HOSPITAL_COMMUNITY): Payer: Self-pay | Admitting: *Deleted

## 2018-12-11 ENCOUNTER — Ambulatory Visit (HOSPITAL_COMMUNITY): Payer: Medicare Other

## 2018-12-11 ENCOUNTER — Ambulatory Visit (HOSPITAL_COMMUNITY)
Admission: RE | Admit: 2018-12-11 | Discharge: 2018-12-11 | Disposition: A | Discharge status: Home / Self Care | Payer: Medicare Other | Source: Ambulatory Visit | Attending: Cardiology | Admitting: Cardiology

## 2018-12-11 ENCOUNTER — Encounter (HOSPITAL_COMMUNITY)
Admission: RE | Disposition: A | Discharge status: Home / Self Care | Payer: Self-pay | Source: Home / Self Care | Attending: Cardiology

## 2018-12-11 ENCOUNTER — Ambulatory Visit (HOSPITAL_COMMUNITY)
Admission: RE | Admit: 2018-12-11 | Discharge: 2018-12-11 | Disposition: A | Discharge status: Home / Self Care | Payer: Medicare Other | Attending: Cardiology | Admitting: Cardiology

## 2018-12-11 VITALS — BP 126/76 | HR 64 | Wt 120.8 lb

## 2018-12-11 DIAGNOSIS — N183 Chronic kidney disease, stage 3 (moderate): Secondary | ICD-10-CM | POA: Diagnosis not present

## 2018-12-11 DIAGNOSIS — I5032 Chronic diastolic (congestive) heart failure: Secondary | ICD-10-CM | POA: Insufficient documentation

## 2018-12-11 DIAGNOSIS — I4819 Other persistent atrial fibrillation: Secondary | ICD-10-CM | POA: Insufficient documentation

## 2018-12-11 DIAGNOSIS — E785 Hyperlipidemia, unspecified: Secondary | ICD-10-CM | POA: Insufficient documentation

## 2018-12-11 DIAGNOSIS — M419 Scoliosis, unspecified: Secondary | ICD-10-CM | POA: Insufficient documentation

## 2018-12-11 DIAGNOSIS — Z951 Presence of aortocoronary bypass graft: Secondary | ICD-10-CM | POA: Insufficient documentation

## 2018-12-11 DIAGNOSIS — Z955 Presence of coronary angioplasty implant and graft: Secondary | ICD-10-CM | POA: Insufficient documentation

## 2018-12-11 DIAGNOSIS — I252 Old myocardial infarction: Secondary | ICD-10-CM | POA: Diagnosis not present

## 2018-12-11 DIAGNOSIS — G4733 Obstructive sleep apnea (adult) (pediatric): Secondary | ICD-10-CM | POA: Diagnosis not present

## 2018-12-11 DIAGNOSIS — I471 Supraventricular tachycardia: Secondary | ICD-10-CM | POA: Insufficient documentation

## 2018-12-11 DIAGNOSIS — I7300 Raynaud's syndrome without gangrene: Secondary | ICD-10-CM | POA: Insufficient documentation

## 2018-12-11 DIAGNOSIS — I5033 Acute on chronic diastolic (congestive) heart failure: Secondary | ICD-10-CM

## 2018-12-11 DIAGNOSIS — I2510 Atherosclerotic heart disease of native coronary artery without angina pectoris: Secondary | ICD-10-CM | POA: Diagnosis not present

## 2018-12-11 DIAGNOSIS — Z7901 Long term (current) use of anticoagulants: Secondary | ICD-10-CM | POA: Insufficient documentation

## 2018-12-11 DIAGNOSIS — I484 Atypical atrial flutter: Secondary | ICD-10-CM | POA: Insufficient documentation

## 2018-12-11 DIAGNOSIS — I130 Hypertensive heart and chronic kidney disease with heart failure and stage 1 through stage 4 chronic kidney disease, or unspecified chronic kidney disease: Secondary | ICD-10-CM | POA: Insufficient documentation

## 2018-12-11 DIAGNOSIS — Z8673 Personal history of transient ischemic attack (TIA), and cerebral infarction without residual deficits: Secondary | ICD-10-CM | POA: Insufficient documentation

## 2018-12-11 DIAGNOSIS — G14 Postpolio syndrome: Secondary | ICD-10-CM | POA: Diagnosis not present

## 2018-12-11 DIAGNOSIS — K219 Gastro-esophageal reflux disease without esophagitis: Secondary | ICD-10-CM | POA: Diagnosis not present

## 2018-12-11 DIAGNOSIS — Z87891 Personal history of nicotine dependence: Secondary | ICD-10-CM | POA: Diagnosis not present

## 2018-12-11 HISTORY — PX: CARDIOVERSION: SHX1299

## 2018-12-11 SURGERY — CARDIOVERSION
Anesthesia: General

## 2018-12-11 MED ORDER — LIDOCAINE HCL (CARDIAC) PF 100 MG/5ML IV SOSY
PREFILLED_SYRINGE | INTRAVENOUS | Status: DC | PRN
  Administered 2018-12-11: 30 mg via INTRATRACHEAL
  Administered 2018-12-11: 60 mg via INTRATRACHEAL

## 2018-12-11 MED ORDER — SODIUM CHLORIDE 0.9 % IV SOLN
INTRAVENOUS | Status: DC
  Administered 2018-12-11: 11:00:00 via INTRAVENOUS

## 2018-12-11 MED ORDER — SODIUM CHLORIDE 0.9 % IV SOLN
INTRAVENOUS | Status: DC | PRN
  Administered 2018-12-11: 12:00:00 via INTRAVENOUS

## 2018-12-11 MED ORDER — PROPOFOL 10 MG/ML IV BOLUS
INTRAVENOUS | Status: DC | PRN
  Administered 2018-12-11: 50 mg via INTRAVENOUS

## 2018-12-11 NOTE — Progress Notes (Addendum)
Pt came in for bp check and ekg.

## 2018-12-11 NOTE — Interval H&P Note (Signed)
History and Physical Interval Note:  12/11/2018 12:31 PM  [name]  has presented today for surgery, with the diagnosis of a tach  The various methods of treatment have been discussed with the patient and family. After consideration of risks, benefits and other options for treatment, the patient has consented to  Procedure(s): CARDIOVERSION (N/A) as a surgical intervention .  The patient's history has been reviewed, patient examined, no change in status, stable for surgery.  I have reviewed the patient's chart and labs.  Questions were answered to the patient's satisfaction.     Lasandra Batley [employer]

## 2018-12-11 NOTE — Discharge Instructions (Signed)
Electrical Cardioversion, Care After °This sheet gives you information about how to care for yourself after your procedure. Your health care provider may also give you more specific instructions. If you have problems or questions, contact your health care provider. °What can I expect after the procedure? °After the procedure, it is common to have: °· Some redness on the skin where the shocks were given. ° °Follow these instructions at home: °· Do not drive for 24 hours if you were given a medicine to help you relax (sedative). °· Take over-the-counter and prescription medicines only as told by your health care provider. °· Ask your health care provider how to check your pulse. Check it often. °· Rest for 48 hours after the procedure or as told by your health care provider. °· Avoid or limit your caffeine use as told by your health care provider. °Contact a health care provider if: °· You feel like your heart is beating too quickly or your pulse is not regular. °· You have a serious muscle cramp that does not go away. °Get help right away if: °· You have discomfort in your chest. °· You are dizzy or you feel faint. °· You have trouble breathing or you are short of breath. °· Your speech is slurred. °· You have trouble moving an arm or leg on one side of your body. °· Your fingers or toes turn cold or blue. °This information is not intended to replace advice given to you by your health care provider. Make sure you discuss any questions you have with your health care provider. °Document Released: 10/03/2013 Document Revised: 07/16/2016 Document Reviewed: 06/18/2016 °Elsevier Interactive Patient Education © 2018 [employer] ° °

## 2018-12-11 NOTE — Anesthesia Postprocedure Evaluation (Signed)
Anesthesia Post Note  Patient: [name]  Procedure(s) Performed: CARDIOVERSION (N/A )     Patient location during evaluation: Endoscopy Anesthesia Type: General Level of consciousness: awake Pain management: pain level controlled Vital Signs Assessment: post-procedure vital signs reviewed and stable Respiratory status: spontaneous breathing Cardiovascular status: stable Postop Assessment: no apparent nausea or vomiting Anesthetic complications: no    Last Vitals:  Vitals:   12/11/18 1305 12/11/18 1310  BP: (!) 119/57 118/63  Pulse: 71 75  Resp: 20 19  Temp:    SpO2: 99% 99%    Last Pain:  Vitals:   12/11/18 1310  TempSrc:   PainSc: 0-No pain   Pain Goal:                 [name]

## 2018-12-11 NOTE — Transfer of Care (Signed)
Immediate Anesthesia Transfer of Care Note  Patient: [name]  Procedure(s) Performed: CARDIOVERSION (N/A )  Patient Location: Endoscopy Unit  Anesthesia Type:General  Level of Consciousness: awake, alert  and oriented  Airway & Oxygen Therapy: Patient Spontanous Breathing and Patient connected to nasal cannula oxygen  Post-op Assessment: Report given to RN, Post -op Vital signs reviewed and stable and Patient moving all extremities X 4  Post vital signs: Reviewed and stable  Last Vitals:  Vitals Value Taken Time  BP    Temp    Pulse    Resp    SpO2      Last Pain:  Vitals:   12/11/18 1040  TempSrc: Oral  PainSc: 0-No pain         Complications: No apparent anesthesia complications

## 2018-12-11 NOTE — Anesthesia Preprocedure Evaluation (Signed)
Anesthesia Evaluation  Patient identified by MRN, date of birth, ID band Patient awake    Reviewed: Allergy & Precautions, NPO status , Patient's Chart, lab work & pertinent test results  Airway Mallampati: I       Dental  (+) Teeth Intact   Pulmonary former smoker,    Pulmonary exam normal breath sounds clear to auscultation       Cardiovascular hypertension, Pt. on medications + CAD, + Past MI and +CHF   Rhythm:Irregular Rate:Normal     Neuro/Psych negative psych ROS   GI/Hepatic Neg liver ROS,   Endo/Other  negative endocrine ROS  Renal/GU      Musculoskeletal   Abdominal Normal abdominal exam  (+)   Peds  Hematology negative hematology ROS (+)   Anesthesia Other Findings [facility] PERF [facility] STRESS 1D  Order# [id number]  Reading physician: Skeet Latch, MD Ordering physician: Rogelia Mire, NP Study date: 06/06/17 Patient Information   Name MRN Description [name] [id number] 82 y.o. male Result Notes for Myocardial Perfusion Imaging   Notes recorded by Katrine Coho, RN on 06/07/2017 at 8:29 AM EDT Pt notified. ------  Notes recorded by Rogelia Mire, NP on 06/06/2017 at 9:28 PM EDT Low risk study. No evidence for reduced blood flow to heart muscle. Good news.   Vitals   Height Weight BMI (Calculated) 5\' 6"  (1.676 m) 57.6 kg 20.9 Study Highlights    Nuclear stress EF: 54%.  There was no ST segment deviation noted during stress.  This is a low risk study.  The left ventricular ejection fraction is mildly decreased (45-54%).  No ischemia    Reproductive/Obstetrics                             Anesthesia Physical Anesthesia Plan  ASA: II  Anesthesia Plan: General   Post-op Pain Management:    Induction:   PONV Risk Score and Plan: 2  Airway Management Planned: Natural Airway and Mask  Additional Equipment:    Intra-op Plan:   Post-operative Plan:   Informed Consent: I have reviewed the patients History and Physical, chart, labs and discussed the procedure including the risks, benefits and alternatives for the proposed anesthesia with the patient or authorized representative who has indicated his/her understanding and acceptance.     Plan Discussed with: CRNA  Anesthesia Plan Comments:         Anesthesia Quick Evaluation

## 2018-12-11 NOTE — Procedures (Signed)
Electrical Cardioversion Procedure Note [name] [id number] Oct 06, 1936  Procedure: Electrical Cardioversion Indications:  Atypical atrial flutter  Procedure Details Consent: Risks of procedure as well as the alternatives and risks of each were explained to the (patient/caregiver).  Consent for procedure obtained. Time Out: Verified patient identification, verified procedure, site/side was marked, verified correct patient position, special equipment/implants available, medications/allergies/relevent history reviewed, required imaging and test results available.  Performed  Patient placed on cardiac monitor, pulse oximetry, supplemental oxygen as necessary.  Sedation given: Propofol per anesthesiology Pacer pads placed anterior and posterior chest.  Cardioverted 2 time(s).  Cardioverted at [place].  Evaluation Findings: Post procedure EKG shows: NSR with type 1 2nd degree AVB Complications: None Patient did tolerate procedure well.   [name] 12/11/2018, 12:51 PM

## 2018-12-12 ENCOUNTER — Telehealth: Payer: Self-pay | Admitting: *Deleted

## 2018-12-12 DIAGNOSIS — G4734 Idiopathic sleep related nonobstructive alveolar hypoventilation: Secondary | ICD-10-CM

## 2018-12-12 NOTE — Telephone Encounter (Signed)
-----   Message from Sueanne Margarita, MD sent at 12/08/2018  1:55 PM EST ----- [name]  Please patient know that since he had severe sleep apnea with an AHI of 47/h during REM sleep even though he has overall mild sleep apnea, given his nocturnal hypoxemia overnight oxygen just on its own is not the best treatment and he really needs BiPAP.  If he is insistent and adamant that he is not going to use Pap therapy then he will need to get an overnight pulse oximetry to document nocturnal hypoxemia in order to approve nighttime oxygen therapy.  [name] ----- Message ----- From: Larey Dresser, MD Sent: 12/07/2018  11:31 PM EST To: Sueanne Margarita, MD  Tressia Miners, Mr Frei is having trouble tolerating CPAP.  Could he use oxygen at night instead?

## 2018-12-12 NOTE — Progress Notes (Signed)
[name] with afib clinic will contact patient to schedule.

## 2018-12-12 NOTE — Telephone Encounter (Signed)
Patient says he can not tolerate the cpap but he is willing to do the overnight pulse oximetry.  Order placed to [facility] for an overnight pulse oximetry to document nocturnal hypoxemia in order to approve nighttime oxygen therapy.

## 2018-12-15 ENCOUNTER — Ambulatory Visit (HOSPITAL_COMMUNITY)
Admission: RE | Admit: 2018-12-15 | Discharge: 2018-12-15 | Disposition: A | Discharge status: Home / Self Care | Payer: Medicare Other | Source: Ambulatory Visit | Attending: Nurse Practitioner | Admitting: Nurse Practitioner

## 2018-12-15 ENCOUNTER — Encounter (HOSPITAL_COMMUNITY): Payer: Self-pay | Admitting: Nurse Practitioner

## 2018-12-15 VITALS — BP 138/82 | HR 98 | Ht 66.0 in | Wt 122.2 lb

## 2018-12-15 DIAGNOSIS — J449 Chronic obstructive pulmonary disease, unspecified: Secondary | ICD-10-CM | POA: Insufficient documentation

## 2018-12-15 DIAGNOSIS — I110 Hypertensive heart disease with heart failure: Secondary | ICD-10-CM | POA: Insufficient documentation

## 2018-12-15 DIAGNOSIS — I4819 Other persistent atrial fibrillation: Secondary | ICD-10-CM | POA: Diagnosis not present

## 2018-12-15 DIAGNOSIS — I2510 Atherosclerotic heart disease of native coronary artery without angina pectoris: Secondary | ICD-10-CM | POA: Diagnosis not present

## 2018-12-15 DIAGNOSIS — I5032 Chronic diastolic (congestive) heart failure: Secondary | ICD-10-CM | POA: Insufficient documentation

## 2018-12-15 DIAGNOSIS — I493 Ventricular premature depolarization: Secondary | ICD-10-CM | POA: Insufficient documentation

## 2018-12-15 DIAGNOSIS — K219 Gastro-esophageal reflux disease without esophagitis: Secondary | ICD-10-CM | POA: Insufficient documentation

## 2018-12-15 DIAGNOSIS — Z8249 Family history of ischemic heart disease and other diseases of the circulatory system: Secondary | ICD-10-CM | POA: Insufficient documentation

## 2018-12-15 DIAGNOSIS — G4733 Obstructive sleep apnea (adult) (pediatric): Secondary | ICD-10-CM | POA: Insufficient documentation

## 2018-12-15 DIAGNOSIS — Z7901 Long term (current) use of anticoagulants: Secondary | ICD-10-CM | POA: Diagnosis not present

## 2018-12-15 DIAGNOSIS — Z79899 Other long term (current) drug therapy: Secondary | ICD-10-CM | POA: Insufficient documentation

## 2018-12-15 DIAGNOSIS — Z87891 Personal history of nicotine dependence: Secondary | ICD-10-CM | POA: Insufficient documentation

## 2018-12-15 DIAGNOSIS — Z8612 Personal history of poliomyelitis: Secondary | ICD-10-CM | POA: Insufficient documentation

## 2018-12-15 DIAGNOSIS — G902 Horner's syndrome: Secondary | ICD-10-CM | POA: Insufficient documentation

## 2018-12-15 DIAGNOSIS — I480 Paroxysmal atrial fibrillation: Secondary | ICD-10-CM

## 2018-12-15 DIAGNOSIS — Z955 Presence of coronary angioplasty implant and graft: Secondary | ICD-10-CM | POA: Insufficient documentation

## 2018-12-15 DIAGNOSIS — Z951 Presence of aortocoronary bypass graft: Secondary | ICD-10-CM | POA: Insufficient documentation

## 2018-12-15 DIAGNOSIS — I639 Cerebral infarction, unspecified: Secondary | ICD-10-CM | POA: Diagnosis not present

## 2018-12-15 DIAGNOSIS — I252 Old myocardial infarction: Secondary | ICD-10-CM | POA: Diagnosis not present

## 2018-12-15 LAB — MAGNESIUM: Magnesium: 2.4 mg/dL (ref 1.7–2.4)

## 2018-12-15 NOTE — Progress Notes (Signed)
Primary Care Physician: Burnard Bunting, MD Primary Cardiologist: Dr Aundra Dubin  Referring Physician: Dr [name] is a 82 y.o. male with a history of persisent atrial fibrillation, CAD, COPD, CVA, OSA, HTN, and hyperlipidenmia who presents for consultation in the [facility]. The patient was initially diagnosed with atrial fibrillation several years ago and is s/p afib ablation 06/25/14 at [facility] and has been maintained on Tikosyn since. He has had some possible atrial flutter vs ectopic atrial rhythm. On 12/07/18, he was noted to be in an ectopic atrial rhythm with 2:1 block and was cardioverted on 12/11/18. On review of his ECGs, it appears he had some transient AV dissociation post cardioversion but is back in SR today. He has been asymptomatic. He denies bleeding issues with anticoagulation. He has OSA and struggles with using his CPAP. He sees Dr Radford Pax for this.  Today, he denies symptoms of palpitations, chest pain, shortness of breath, orthopnea, PND, lower extremity edema, dizziness, presyncope, syncope, snoring, daytime somnolence, bleeding, or neurologic sequela. The patient is tolerating medications without difficulties and is otherwise without complaint today.    Atrial Fibrillation Risk Factors:  he does have symptoms or diagnosis of sleep apnea. he is not compliant with CPAP therapy.   he has a BMI of Body mass index is 19.72 kg/m.[name] Filed Weights   12/15/18 1041  Weight: 55.4 kg    LA size: 16mm   Atrial Fibrillation Management history:  Previous antiarrhythmic drugs: Tikosyn  Previous cardioversions: 12/11/18  Previous ablations: 2015 at [facility] score: 6 (age, HTN, CAD, CVA)  Anticoagulation history: Xarelto   Past Medical History:  Diagnosis Date  . Abnormal nuclear cardiac imaging test March 2011   Has positive EKG response, no perfusion defect and normal EF  . Adenomatous colon polyp 12/2002  . Atrial  fibrillation ([place])    a. s/p rfca;  b. chronic tikosyn and xarelto.  . 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 Cataract    bil cataracts removed  . Chronic diastolic CHF (congestive heart failure) ([place])    a. 04/2017 Echo: EF 65-70%, restrictive physiology, mildly dil LA.  [name] COPD (chronic obstructive pulmonary disease) ([place])   . Coronary artery disease    First MI in 1988. PCI in 1996 with subsequent CABG in 1996 due to restenosis. S/P stents to LCX in 2009. Noted to have residual disease in the LAD in a diffuse manner and atretic LIMA graft. He is managed medically.   . Esophageal stricture   . GERD (gastroesophageal reflux disease)   . 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 History of Raynaud's syndrome   . Hyperlipidemia   . Hypertension   . Internal hemorrhoids   . Myocardial infarction [facility])    MI x1 31 - 88 age  . Neuromuscular disorder ([place])    Polio - age 45  . OSA (obstructive sleep apnea) 10/16/2018   Severe OSA with AHI 37.2/hr on BiPAP  . Persistent atrial fibrillation   . Polio    age 69  . PVC (premature ventricular contraction)   . Scoliosis    mild   Past Surgical History:  Procedure Laterality Date  . ABLATION     done for a fib  . APPENDECTOMY     Age 42  . CARDIOVERSION N/A 08/22/2013   Procedure: CARDIOVERSION;  Surgeon: Carlena Bjornstad, MD;  Location: [place];  Service: Cardiovascular;  Laterality: N/A;  . CARDIOVERSION N/A 01/16/2014   Procedure: CARDIOVERSION;  Surgeon: Lelon Perla, MD;  Location: [place] ENDOSCOPY;  Service: Cardiovascular;  Laterality: N/A;  . CARDIOVERSION N/A 12/11/2018   Procedure: CARDIOVERSION;  Surgeon: Larey Dresser, MD;  Location: [facility] ENDOSCOPY;  Service: Cardiovascular;  Laterality: N/A;  . COLONOSCOPY  2008  . CORONARY ARTERY BYPASS GRAFT  1996   with a LIMA to the LAD, SVG to RCA and SVG to OM  . CORONARY STENT PLACEMENT  2009    LCX  . POLYPECTOMY    . TONSILLECTOMY    . UPPER  GASTROINTESTINAL ENDOSCOPY      Current Outpatient Medications  Medication Sig Dispense Refill  . acetaminophen (TYLENOL) 500 MG tablet Take 500 mg by mouth every 12 (twelve) hours.    [name] albuterol (PROAIR HFA) 108 (90 BASE) MCG/ACT inhaler Inhale 2 puffs into the lungs every 4 (four) hours as needed for wheezing or shortness of breath (and prior to exercise). 1 Inhaler 3  . B Complex Vitamins (VITAMIN B COMPLEX PO) Take 1 tablet by mouth daily.    [name] BYSTOLIC 10 MG tablet TAKE 1 TABLET BY MOUTH EVERY DAY (Patient taking differently: Take 10 mg by mouth daily. ) 30 tablet 8  . cetirizine (ZYRTEC) 10 MG tablet Take 10 mg by mouth daily as needed for allergies.     [name] diltiazem (CARDIZEM CD) 120 MG 24 hr capsule TAKE ONE CAPSULE BY MOUTH ONCE EACH DAY. (Patient taking differently: Take 120 mg by mouth daily. ) 90 capsule 3  . dofetilide (TIKOSYN) 250 MCG capsule TAKE 1 CAPSULE BY MOUTH EVERY 12 HOURS *NEED OFFICE VISIT* (Patient taking differently: Take 250 mcg by mouth 2 (two) times daily. ) 60 capsule 9  . ezetimibe (ZETIA) 10 MG tablet TAKE 1 TABLET BY MOUTH EVERY DAY (Patient taking differently: Take 10 mg by mouth daily. ) 90 tablet 0  . furosemide (LASIX) 40 MG tablet Take one tablet two times daily for 5 days, then 1 each morning and 1/2 in the afternoon. (Patient taking differently: Take 20-40 mg by mouth See admin instructions. Take 40 mg by mouth daily and 20mg  in the evening if needed.) 140 tablet 1  . lactobacillus acidophilus & bulgar (LACTINEX) chewable tablet Chew 2 tablets by mouth daily.     [name] loperamide (IMODIUM) 2 MG capsule Take by mouth as needed for diarrhea or loose stools.    [name] LORazepam (ATIVAN) 0.5 MG tablet Take 0.5 mg by mouth at bedtime as needed for sleep.     . methocarbamol (ROBAXIN) 500 MG tablet Take 500 mg by mouth 3 (three) times daily as needed for muscle spasms.     . potassium chloride SA (KLOR-CON M20) 20 MEQ tablet Take 2 tablets (40 mEq total) by mouth 2 (two)  times daily for 2 days, THEN 1 tablet (20 mEq total) 2 (two) times daily. (Patient taking differently: Take 20 meq by mouth daily) 180 tablet 3  . Probiotic Product (ALIGN) 4 MG CAPS Take 4 mg by mouth daily.    . ranitidine (ZANTAC) 150 MG tablet Take 2 tablets (300 mg total) 2 (two) times daily by mouth. (Patient taking differently: Take 150 mg by mouth 2 (two) times daily. ) 120 tablet 11  . Rivaroxaban (XARELTO) 15 MG TABS tablet Take 1 tablet (15 mg total) by mouth every evening. 90 tablet 3  . rosuvastatin (CRESTOR) 40 MG tablet Take 40 mg by mouth every evening.    [name]  traZODone (DESYREL) 50 MG tablet Take 50 mg by mouth at bedtime.    . Vitamin D, Ergocalciferol, (DRISDOL) 50000 UNITS CAPS capsule Take 50,000 Units by mouth every 14 (fourteen) days. Take on 1st and 15th    . AMBULATORY NON FORMULARY MEDICATION Apply 1 application topically daily. Testosterone cream 150mg /15% Place 15% onto the skin daily      No current facility-administered medications for this encounter.     Allergies  Allergen Reactions  . Morphine And Related Other (See Comments)    IV forms- vein irritation    Social History   Socioeconomic History  . Marital status: Married    Spouse name: Not on file  . Number of children: 2  . Years of education: Not on file  . Highest education level: Not on file  Occupational History  . Occupation: Software engineer  Social Needs  . Financial resource strain: Not on file  . Food insecurity:    Worry: Not on file    Inability: Not on file  . Transportation needs:    Medical: Not on file    Non-medical: Not on file  Tobacco Use  . Smoking status: Former Smoker    Packs/day: 0.50    Years: 5.00    Pack years: 2.50    Types: Cigarettes    Last attempt to quit: 12/27/1966    Years since quitting: 52.0  . Smokeless tobacco: Former Systems developer    Quit date: 1950  Substance and Sexual Activity  . Alcohol use: No  . Drug use: No  . Sexual activity: Yes  Lifestyle  .  Physical activity:    Days per week: Not on file    Minutes per session: Not on file  . Stress: Not on file  Relationships  . Social connections:    Talks on phone: Not on file    Gets together: Not on file    Attends religious service: Not on file    Active member of club or organization: Not on file    Attends meetings of clubs or organizations: Not on file    Relationship status: Not on file  . Intimate partner violence:    Fear of current or ex partner: Not on file    Emotionally abused: Not on file    Physically abused: Not on file    Forced sexual activity: Not on file  Other Topics Concern  . 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 Heart disease Mother   . Heart disease Father   . Heart Problems Brother        heart transplant  . Heart disease Maternal Grandmother   . Heart disease Maternal Grandfather   . Heart disease Paternal Grandmother   . Heart disease Paternal Grandfather   . Prostate cancer Paternal Uncle   . Cancer Son        thymic cancer.  died at age 83  . Colon cancer Neg Hx   . Esophageal cancer Neg Hx   . Pancreatic cancer Neg Hx   . Rectal cancer Neg Hx   . Stomach cancer Neg Hx     ROS- All systems are reviewed and negative except as per the HPI above.  Physical Exam: Vitals:   12/15/18 1041  BP: 138/82  Pulse: 98  Weight: 55.4 kg  Height:  5\' 6"  (1.676 m)    GEN- The patient is well appearing, alert and oriented x 3 today.   Head- normocephalic, atraumatic Eyes-  Sclera clear, conjunctiva pink Ears- hearing intact Oropharynx- clear Neck- supple  Lungs- Clear to ausculation bilaterally, normal work of breathing Heart- Regular rate and rhythm, no murmurs, rubs or gallops  GI- soft, NT, ND, + BS Extremities- no clubbing, cyanosis, or edema MS- no significant deformity or atrophy Skin- no rash or  lesion Psych- euthymic mood, full affect Neuro- strength and sensation are intact  Wt Readings from Last 3 Encounters:  12/15/18 55.4 kg  12/11/18 54.8 kg  12/11/18 54.8 kg    EKG today demonstrates sinus rhythm HR 98, NST, PR 160, QRS 108, QTc 464 Echo 05/21/17 demonstrated  - Left ventricle: The cavity size was normal. There was mild focal   basal hypertrophy of the septum. Systolic function was vigorous.   The estimated ejection fraction was in the range of 65% to 70%.   Doppler parameters are consistent with restrictive physiology,   indicative of decreased left ventricular diastolic compliance   and/or increased left atrial pressure. Doppler parameters are   consistent with elevated ventricular end-diastolic filling   pressure. - Aortic valve: Structurally normal valve. There was trivial   regurgitation. - Ascending aorta: The ascending aorta was normal in size. - Mitral valve: There was mild regurgitation. Valve area by   continuity equation (using LVOT flow): 1.36 cm^2. - Left atrium: The atrium was mildly dilated. - Right ventricle: The cavity size was moderately dilated. Wall   thickness was normal. Systolic function was normal. - Right atrium: The atrium was moderately dilated. - Tricuspid valve: There was mild regurgitation. - Pulmonary arteries: Systolic pressure was moderately increased.   PA peak pressure: 57 mm Hg (S). - Inferior vena cava: The vessel was dilated. The respirophasic   diameter changes were blunted (< 50%), consistent with elevated   central venous pressure. - Pericardium, extracardiac: The pericardium was normal in   appearance.  Epic records are reviewed at length today  Assessment and Plan:  1. Persistent atrial fibrillation The patient has asymptomatic persistent atrial fibrillation.  The patients CHAD2VASC score is 6.  he is  appropriately anticoagulated at this time on Xarelto. S/p DCCV on 12/11/18. Transient AV dissociation  afterwards. In SR today. Discussion of lifestyle modification including using CPAP as well as regular physical activity was also discussed.  Continue Xarelto 20 mg BID Continue dofetilide 250 mcg BID. QT stable. Continue diltiazem 120 mg daily and Bystolic 10 mg daily Will check magnesium level. Recent Bmet stable.  2. Obstructive sleep apnea The importance of adequate treatment of sleep apnea was discussed today in order to improve our ability to maintain sinus rhythm long term. Has follow up with Dr Radford Pax  3. CAD No anginal symptoms. Continue present therapy.   4. HTN Stable, no changes today  Follow up with Dr Radford Pax and Dr Aundra Dubin as scheduled.  Geroge Baseman Jameel Quant, [facility] [address] [place], [place] [address] [phone]

## 2018-12-17 ENCOUNTER — Other Ambulatory Visit: Payer: Self-pay | Admitting: Nurse Practitioner

## 2018-12-18 NOTE — Telephone Encounter (Signed)
This is a CHF pt, [name] follows this pt.

## 2018-12-21 NOTE — Telephone Encounter (Signed)
Would route to [name]

## 2018-12-29 ENCOUNTER — Ambulatory Visit (HOSPITAL_COMMUNITY)
Admission: RE | Admit: 2018-12-29 | Discharge: 2018-12-29 | Disposition: A | Discharge status: Home / Self Care | Payer: Medicare Other | Source: Ambulatory Visit | Attending: Cardiology | Admitting: Cardiology

## 2018-12-29 DIAGNOSIS — I509 Heart failure, unspecified: Secondary | ICD-10-CM | POA: Diagnosis not present

## 2018-12-29 NOTE — Progress Notes (Signed)
  Echocardiogram 2D Echocardiogram has been performed.  [name] 12/29/2018, 9:21 AM

## 2019-01-04 ENCOUNTER — Other Ambulatory Visit (INDEPENDENT_AMBULATORY_CARE_PROVIDER_SITE_OTHER): Payer: Medicare Other

## 2019-01-04 ENCOUNTER — Encounter: Payer: Self-pay | Admitting: Gastroenterology

## 2019-01-04 ENCOUNTER — Ambulatory Visit (INDEPENDENT_AMBULATORY_CARE_PROVIDER_SITE_OTHER): Payer: Medicare Other | Admitting: Gastroenterology

## 2019-01-04 VITALS — BP 132/64 | HR 76 | Ht 66.0 in | Wt 120.0 lb

## 2019-01-04 DIAGNOSIS — R634 Abnormal weight loss: Secondary | ICD-10-CM

## 2019-01-04 DIAGNOSIS — R1011 Right upper quadrant pain: Secondary | ICD-10-CM

## 2019-01-04 DIAGNOSIS — K219 Gastro-esophageal reflux disease without esophagitis: Secondary | ICD-10-CM

## 2019-01-04 DIAGNOSIS — R197 Diarrhea, unspecified: Secondary | ICD-10-CM | POA: Diagnosis not present

## 2019-01-04 DIAGNOSIS — K862 Cyst of pancreas: Secondary | ICD-10-CM | POA: Diagnosis not present

## 2019-01-04 LAB — HEPATIC FUNCTION PANEL
ALT: 26 U/L (ref 0–53)
AST: 32 U/L (ref 0–37)
Albumin: 4.3 g/dL (ref 3.5–5.2)
Alkaline Phosphatase: 57 U/L (ref 39–117)
Bilirubin, Direct: 0.3 mg/dL (ref 0.0–0.3)
Total Bilirubin: 1.2 mg/dL (ref 0.2–1.2)
Total Protein: 6.9 g/dL (ref 6.0–8.3)

## 2019-01-04 MED ORDER — DICYCLOMINE HCL 10 MG PO CAPS: 10.0000 mg | ORAL_CAPSULE | Freq: Three times a day (TID) | ORAL | 11 refills | Status: DC

## 2019-01-04 MED ORDER — FAMOTIDINE 40 MG PO TABS: 40.0000 mg | ORAL_TABLET | Freq: Every day | ORAL | 11 refills | Status: DC

## 2019-01-04 NOTE — Patient Instructions (Addendum)
We have sent the following medications to your pharmacy for you to pick up at your convenience: famotidine and dicyclomine.   Take your Imodium 1-2 tablets by mouth daily, not as needed.   You have been scheduled for an MRI/MRCP at [facility] department on 01/08/19. Your appointment time is 8:00am. Please arrive 30 minutes prior to your appointment time for registration purposes. Please make certain not to have anything to eat or drink 4 hours prior to your test. In addition, if you have any metal in your body, have a pacemaker or defibrillator, please be sure to let your ordering physician know. This test typically takes 45 minutes to 1 hour to complete. Should you need to reschedule, please call [phone] to do so.  Thank you for choosing me and [facility].  Pricilla Riffle. Dagoberto Ligas., MD., Marval Regal

## 2019-01-04 NOTE — Progress Notes (Signed)
    History of Present Illness: This is an 83 year old male presenting for evaluation of chronic diarrhea, abdominal pain and weight loss.  He is accompanied by his wife.  His weight has decreased 4 pounds since October.  In reviewing his chart his weight has fluctuated but has generally been slowly decreasing over time.  He underwent cardioversion on December 11, 2018 for an ectopic atrial rhythm with 2:1 block and returned to sinus rhythm.  He relates ongoing problems with diarrhea about 6 times per day that has not changed.  He notes intermittent right upper quadrant pain for several months.  In addition he has occasional left lower quadrant pain that appears to be relieved with passage of flatus.  He notes increased intestinal gas for months.  He relates that his reflux symptoms are well controlled on daily famotidine.  Denies constipation, change in stool caliber, melena, hematochezia, nausea, vomiting, dysphagia, chest pain.  Current Medications, Allergies, Past Medical History, Past Surgical History, Family History and Social History were reviewed in [employer] record.  Physical Exam: General: Well developed, well nourished, thin, no acute distress Head: Normocephalic and atraumatic Eyes:  sclerae anicteric, EOMI Ears: Normal auditory acuity Mouth: No deformity or lesions Lungs: Clear throughout to auscultation Heart: Regular rate and rhythm; no murmurs, rubs or bruits Abdomen: Soft, RUQ tenderness and non distended. No masses, hepatosplenomegaly or hernias noted. Normal Bowel sounds Rectal: Not done Musculoskeletal: Symmetrical with no gross deformities  Pulses:  Normal pulses noted Extremities: No clubbing, cyanosis, edema or deformities noted Neurological: Alert oriented x 4, grossly nonfocal Psychological:  Alert and cooperative. Normal mood and affect   Assessment and Recommendations:  1. 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 Check LFTs. Schedule Abd MRI/MRCP with pancreatic protocol. Dicyclomine 10 mg po tid prn.  We will follow-up by phone after his LFTs and MRI are completed for further plans.  2. Diarrhea, chronic, no clear etiology. Imodium qd to bid on a regular scheduled basis. May use Imodium up to qid for improved control of diarrhea.   3. GERD. Antireflux measures. Famotidine 40 mg po daily.   4. Weight loss. Etiology unclear. Prior extensive GI evaluation in 2018. Recommended to increase daily calorie intake.

## 2019-01-08 ENCOUNTER — Ambulatory Visit (HOSPITAL_COMMUNITY)
Admission: RE | Admit: 2019-01-08 | Discharge: 2019-01-08 | Disposition: A | Discharge status: Home / Self Care | Payer: Medicare Other | Source: Ambulatory Visit | Attending: Gastroenterology | Admitting: Gastroenterology

## 2019-01-08 ENCOUNTER — Other Ambulatory Visit: Payer: Self-pay | Admitting: Gastroenterology

## 2019-01-08 DIAGNOSIS — R1011 Right upper quadrant pain: Secondary | ICD-10-CM | POA: Insufficient documentation

## 2019-01-08 DIAGNOSIS — R932 Abnormal findings on diagnostic imaging of liver and biliary tract: Secondary | ICD-10-CM | POA: Diagnosis not present

## 2019-01-08 DIAGNOSIS — R634 Abnormal weight loss: Secondary | ICD-10-CM

## 2019-01-08 DIAGNOSIS — K862 Cyst of pancreas: Secondary | ICD-10-CM | POA: Diagnosis not present

## 2019-01-08 MED ORDER — GADOBUTROL 1 MMOL/ML IV SOLN
7.5000 mL | Freq: Once | INTRAVENOUS | Status: AC | PRN
  Administered 2019-01-08: 5 mL via INTRAVENOUS

## 2019-01-28 NOTE — Progress Notes (Signed)
Cardiology Office Note:    Date:  01/29/2019   ID:  ZEREK LITSEY, DOB 06/17/1936, MRN [id number]  PCP:  Burnard Bunting, MD  Cardiologist:  No primary care provider on file.    Referring MD: Burnard Bunting, MD   Chief Complaint  Patient presents with  . Sleep Apnea    History of Present Illness:    [name] is a 83 y.o. male with a hx of severe obstructive sleep apnea with an AHI of 37.2/h with moderate oxygen desaturations as low as 82%.  He was titrated on CPAP but due to ongoing respiratory events could not be adequately titrated.  He subsequently underwent BiPAP titration.  Unfortunately he did not tolerate the BiPAP device.  He says that half the time it is blowing off his face and other times he takes it off in the middle of the night and does not realize it.  He says he can only sleep for 2 hours at a time before it wakes him up.  He says he is not going to use any type of Pap device and he is not interested in the inspire device as well.  He says he talk to [name] about just using oxygen.  Past Medical History:  Diagnosis Date  . Abnormal nuclear cardiac imaging test March 2011   Has positive EKG response, no perfusion defect and normal EF  . Adenomatous colon polyp 12/2002  . Atrial fibrillation ([place])    a. s/p rfca;  b. chronic tikosyn and xarelto.  . 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 Cataract    bil cataracts removed  . Chronic diastolic CHF (congestive heart failure) ([place])    a. 04/2017 Echo: EF 65-70%, restrictive physiology, mildly dil LA.  [name] COPD (chronic obstructive pulmonary disease) ([place])   . Coronary artery disease    First MI in 1988. PCI in 1996 with subsequent CABG in 1996 due to restenosis. S/P stents to LCX in 2009. Noted to have residual disease in the LAD in a diffuse manner and atretic LIMA graft. He is managed medically.   . Esophageal stricture   . GERD (gastroesophageal reflux disease)   . 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 History of Raynaud's syndrome   . Hyperlipidemia   . Hypertension   . Internal hemorrhoids   . Myocardial infarction [facility])    MI x1 48 - 52 age  . Neuromuscular disorder ([place])    Polio - age 46  . OSA (obstructive sleep apnea) 10/16/2018   Severe OSA with AHI 37.2/hr on BiPAP  . Persistent atrial fibrillation   . Polio    age 66  . PVC (premature ventricular contraction)   . Scoliosis    mild    Past Surgical History:  Procedure Laterality Date  . ABLATION     done for a fib  . APPENDECTOMY     Age 37  . CARDIOVERSION N/A 08/22/2013   Procedure: CARDIOVERSION;  Surgeon: Carlena Bjornstad, MD;  Location: [facility] ENDOSCOPY;  Service: Cardiovascular;  Laterality: N/A;  . CARDIOVERSION N/A 01/16/2014   Procedure: CARDIOVERSION;  Surgeon: Lelon Perla, MD;  Location: [facility] ENDOSCOPY;  Service: Cardiovascular;  Laterality: N/A;  . CARDIOVERSION N/A 12/11/2018   Procedure: CARDIOVERSION;  Surgeon: Larey Dresser, MD;  Location: [facility] ENDOSCOPY;  Service: Cardiovascular;  Laterality: N/A;  . COLONOSCOPY  2008  . CORONARY ARTERY BYPASS GRAFT  1996   with  a LIMA to the LAD, SVG to RCA and SVG to OM  . CORONARY STENT PLACEMENT  2009    LCX  . POLYPECTOMY    . TONSILLECTOMY    . UPPER GASTROINTESTINAL ENDOSCOPY      Current Medications: Current Meds  Medication Sig  . acetaminophen (TYLENOL) 500 MG tablet Take 500 mg by mouth every 12 (twelve) hours.  [name] albuterol (PROAIR HFA) 108 (90 BASE) MCG/ACT inhaler Inhale 2 puffs into the lungs every 4 (four) hours as needed for wheezing or shortness of breath (and prior to exercise).  . AMBULATORY NON FORMULARY MEDICATION Apply 1 application topically daily. Testosterone cream 150mg /15% Place 15% onto the skin daily   . B Complex Vitamins (VITAMIN B COMPLEX PO) Take 1 tablet by mouth daily.  [name] BYSTOLIC 10 MG tablet TAKE 1 TABLET BY MOUTH EVERY DAY (Patient taking differently: Take 10 mg by mouth daily. )  . cetirizine  (ZYRTEC) 10 MG tablet Take 10 mg by mouth daily as needed for allergies.   [name] dicyclomine (BENTYL) 10 MG capsule Take 1 capsule (10 mg total) by mouth 3 (three) times daily before meals.  [name] diltiazem (CARDIZEM CD) 120 MG 24 hr capsule TAKE ONE CAPSULE BY MOUTH ONCE EACH DAY. (Patient taking differently: Take 120 mg by mouth daily. )  . dofetilide (TIKOSYN) 250 MCG capsule TAKE 1 CAPSULE BY MOUTH EVERY 12 HOURS *NEED OFFICE VISIT*  . ezetimibe (ZETIA) 10 MG tablet TAKE 1 TABLET BY MOUTH EVERY DAY (Patient taking differently: Take 10 mg by mouth daily. )  . famotidine (PEPCID) 40 MG tablet Take 1 tablet (40 mg total) by mouth at bedtime.  . furosemide (LASIX) 40 MG tablet Take one tablet two times daily for 5 days, then 1 each morning and 1/2 in the afternoon. (Patient taking differently: Take 20-40 mg by mouth See admin instructions. Take 40 mg by mouth daily and 20mg  in the evening if needed.)  . lactobacillus acidophilus & bulgar (LACTINEX) chewable tablet Chew 2 tablets by mouth daily.   [name] loperamide (IMODIUM) 2 MG capsule Take by mouth as needed for diarrhea or loose stools.  [name] LORazepam (ATIVAN) 0.5 MG tablet Take 0.5 mg by mouth at bedtime as needed for sleep.   . methocarbamol (ROBAXIN) 500 MG tablet Take 500 mg by mouth 3 (three) times daily as needed for muscle spasms.   . potassium chloride SA (KLOR-CON M20) 20 MEQ tablet Take 2 tablets (40 mEq total) by mouth 2 (two) times daily for 2 days, THEN 1 tablet (20 mEq total) 2 (two) times daily. (Patient taking differently: Take 20 meq by mouth daily)  . Probiotic Product (ALIGN) 4 MG CAPS Take 4 mg by mouth daily.  . Rivaroxaban (XARELTO) 15 MG TABS tablet Take 1 tablet (15 mg total) by mouth every evening.  . rosuvastatin (CRESTOR) 40 MG tablet Take 40 mg by mouth every evening.  . traZODone (DESYREL) 50 MG tablet Take 50 mg by mouth at bedtime.  . Vitamin D, Ergocalciferol, (DRISDOL) 50000 UNITS CAPS capsule Take 50,000 Units by mouth every 14  (fourteen) days. Take on 1st and 15th     Allergies:   Morphine and related   Social History   Socioeconomic History  . Marital status: Married    Spouse name: Not on file  . Number of children: 2  . Years of education: Not on file  . Highest education level: Not on file  Occupational History  . Occupation: Software engineer  Social Needs  .  Financial resource strain: Not on file  . Food insecurity:    Worry: Not on file    Inability: Not on file  . Transportation needs:    Medical: Not on file    Non-medical: Not on file  Tobacco Use  . Smoking status: Former Smoker    Packs/day: 0.50    Years: 5.00    Pack years: 2.50    Types: Cigarettes    Last attempt to quit: 12/27/1966    Years since quitting: 52.1  . Smokeless tobacco: Former Systems developer    Quit date: 1950  Substance and Sexual Activity  . Alcohol use: No  . Drug use: No  . Sexual activity: Yes  Lifestyle  . Physical activity:    Days per week: Not on file    Minutes per session: Not on file  . Stress: Not on file  Relationships  . Social connections:    Talks on phone: Not on file    Gets together: Not on file    Attends religious service: Not on file    Active member of club or organization: Not on file    Attends meetings of clubs or organizations: Not on file    Relationship status: Not on file  Other Topics Concern  . 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 There is no history of Colon cancer, Esophageal cancer, Pancreatic cancer, Rectal cancer, or Stomach cancer.  ROS:   Please see the history of present illness.    ROS  All other systems reviewed  and negative.   EKGs/Labs/Other Studies Reviewed:    The following studies were reviewed today:  EKG:  EKG is not ordered today.    Recent Labs: 12/07/2018: BUN 17; Creatinine, Ser 1.43; Hemoglobin 15.7; Platelets 162; Potassium 4.3; Sodium 138 12/15/2018: Magnesium 2.4 01/04/2019: ALT 26   Recent Lipid Panel    Component Value Date/Time   CHOL 124 12/07/2018 1006   TRIG 47 12/07/2018 1006   HDL 53 12/07/2018 1006   CHOLHDL 2.3 12/07/2018 1006   VLDL 9 12/07/2018 1006   LDLCALC 62 12/07/2018 1006    Physical Exam:    VS:  BP 130/70   Pulse 63   Ht 5\' 6"  (1.676 m)   Wt 123 lb 3.2 oz (55.9 kg)   SpO2 98%   BMI 19.89 kg/m     Wt Readings from Last 3 Encounters:  01/29/19 123 lb 3.2 oz (55.9 kg)  01/04/19 120 lb (54.4 kg)  12/15/18 122 lb 3.2 oz (55.4 kg)     GEN:  Well nourished, well developed in no acute distress HEENT: Normal NECK: No JVD; No carotid bruits LYMPHATICS: No lymphadenopathy CARDIAC: \RRR, no murmurs, rubs, gallops RESPIRATORY:  Clear to auscultation without rales, wheezing or rhonchi  ABDOMEN: Soft, non-tender, non-distended MUSCULOSKELETAL:  No edema; No deformity  SKIN: Warm and dry NEUROLOGIC:  Alert and oriented x 3 PSYCHIATRIC:  Normal affect   ASSESSMENT:    1. OSA (obstructive sleep apnea)   2. Paroxysmal atrial fibrillation (HCC)   3. Chronic diastolic (congestive) heart failure (HCC)    PLAN:    In order of problems listed  above:  1.  OSA -he is intolerant to BiPAP and is not interested in using it any further.  We did again discuss the inspire device which he is not interested in.  I recommended considering referral to dentistry to be fitted for an oral device.  I am going to get an overnight pulse oximetry to document nocturnal hypoxemia and then place him on oxygen nightly since he is unwilling to pursue any other treatment options except the oral device.  2.  PAF -  He is back in what appears to be atrial flutter with variable  block and T wave abnormality in the inferior lateral leads.  QTC is 504 ms.  He will continue on Bystolic 10mg  daily, Cardizem CD 120mg  daily, Tikosyn 258mcg BID and Xarelto.  We will get him back into A. fib clinic this week.  3,  Chronic diastolic CHF - he appears euvolemic on exam today.  His weight is stable.  He will continue on Lasix 40mg  daily and 20mg  qpm PRN.   Medication Adjustments/Labs and Tests Ordered: Current medicines are reviewed at length with the patient today.  Concerns regarding medicines are outlined above.  No orders of the defined types were placed in this encounter.  No orders of the defined types were placed in this encounter.   Signed, Fransico Him, MD  01/29/2019 9:31 AM    [place]

## 2019-01-29 ENCOUNTER — Encounter: Payer: Self-pay | Admitting: Cardiology

## 2019-01-29 ENCOUNTER — Ambulatory Visit (INDEPENDENT_AMBULATORY_CARE_PROVIDER_SITE_OTHER): Payer: Medicare Other | Admitting: Cardiology

## 2019-01-29 ENCOUNTER — Telehealth: Payer: Self-pay | Admitting: *Deleted

## 2019-01-29 VITALS — BP 130/70 | HR 63 | Ht 66.0 in | Wt 123.2 lb

## 2019-01-29 DIAGNOSIS — G4733 Obstructive sleep apnea (adult) (pediatric): Secondary | ICD-10-CM

## 2019-01-29 DIAGNOSIS — I480 Paroxysmal atrial fibrillation: Secondary | ICD-10-CM

## 2019-01-29 DIAGNOSIS — I5032 Chronic diastolic (congestive) heart failure: Secondary | ICD-10-CM

## 2019-01-29 DIAGNOSIS — G4734 Idiopathic sleep related nonobstructive alveolar hypoventilation: Secondary | ICD-10-CM

## 2019-01-29 NOTE — Telephone Encounter (Signed)
Order placed to [facility] to get an overnight pulse oximetry to document nocturnal hypoxemia and then place him on oxygen nightly.

## 2019-01-29 NOTE — Patient Instructions (Addendum)
Medication Instructions:  Your physician recommends that you continue on your current medications as directed. Please refer to the Current Medication list given to you today.  If you need a refill on your cardiac medications before your next appointment, please call your pharmacy.   Lab work: None If you have labs (blood work) drawn today and your tests are completely normal, you will receive your results only by: [name] MyChart Message (if you have MyChart) OR . A paper copy in the mail If you have any lab test that is abnormal or we need to change your treatment, we will call you to review the results.  Testing/Procedures: Overnight Pulse Oximetry   Follow-Up: At [facility], you and your health needs are our priority.  As part of our continuing mission to provide you with exceptional heart care, we have created designated Provider Care Teams.  These Care Teams include your primary Cardiologist (physician) and Advanced Practice Providers (APPs -  Physician Assistants and Nurse Practitioners) who all work together to provide you with the care you need, when you need it. You will need a follow up appointment in 6 months.  Please call our office 2 months in advance to schedule this appointment.  You may see Dr. Radford Pax or one of the following Advanced Practice Providers on your designated Care Team:    Schedule an appointment with Afib clinic.  Referred to Dr. Augustina Mood for Oral appliances

## 2019-01-30 ENCOUNTER — Ambulatory Visit (HOSPITAL_COMMUNITY)
Admission: RE | Admit: 2019-01-30 | Discharge: 2019-01-30 | Disposition: A | Discharge status: Home / Self Care | Payer: Medicare Other | Source: Ambulatory Visit | Attending: Nurse Practitioner | Admitting: Nurse Practitioner

## 2019-01-30 ENCOUNTER — Encounter (HOSPITAL_COMMUNITY): Payer: Self-pay | Admitting: Nurse Practitioner

## 2019-01-30 VITALS — BP 120/76 | HR 102 | Ht 66.0 in | Wt 120.0 lb

## 2019-01-30 DIAGNOSIS — I5032 Chronic diastolic (congestive) heart failure: Secondary | ICD-10-CM | POA: Insufficient documentation

## 2019-01-30 DIAGNOSIS — Z79899 Other long term (current) drug therapy: Secondary | ICD-10-CM | POA: Insufficient documentation

## 2019-01-30 DIAGNOSIS — Z8612 Personal history of poliomyelitis: Secondary | ICD-10-CM | POA: Diagnosis not present

## 2019-01-30 DIAGNOSIS — I484 Atypical atrial flutter: Secondary | ICD-10-CM

## 2019-01-30 DIAGNOSIS — Z8249 Family history of ischemic heart disease and other diseases of the circulatory system: Secondary | ICD-10-CM | POA: Diagnosis not present

## 2019-01-30 DIAGNOSIS — I4819 Other persistent atrial fibrillation: Secondary | ICD-10-CM | POA: Insufficient documentation

## 2019-01-30 DIAGNOSIS — Z885 Allergy status to narcotic agent status: Secondary | ICD-10-CM | POA: Diagnosis not present

## 2019-01-30 DIAGNOSIS — E785 Hyperlipidemia, unspecified: Secondary | ICD-10-CM | POA: Diagnosis not present

## 2019-01-30 DIAGNOSIS — K219 Gastro-esophageal reflux disease without esophagitis: Secondary | ICD-10-CM | POA: Diagnosis not present

## 2019-01-30 DIAGNOSIS — I4891 Unspecified atrial fibrillation: Secondary | ICD-10-CM | POA: Diagnosis present

## 2019-01-30 DIAGNOSIS — Z955 Presence of coronary angioplasty implant and graft: Secondary | ICD-10-CM | POA: Diagnosis not present

## 2019-01-30 DIAGNOSIS — I2510 Atherosclerotic heart disease of native coronary artery without angina pectoris: Secondary | ICD-10-CM | POA: Insufficient documentation

## 2019-01-30 DIAGNOSIS — Z7901 Long term (current) use of anticoagulants: Secondary | ICD-10-CM | POA: Diagnosis not present

## 2019-01-30 DIAGNOSIS — I252 Old myocardial infarction: Secondary | ICD-10-CM | POA: Diagnosis not present

## 2019-01-30 DIAGNOSIS — J449 Chronic obstructive pulmonary disease, unspecified: Secondary | ICD-10-CM | POA: Insufficient documentation

## 2019-01-30 DIAGNOSIS — Z87891 Personal history of nicotine dependence: Secondary | ICD-10-CM | POA: Diagnosis not present

## 2019-01-30 DIAGNOSIS — G4733 Obstructive sleep apnea (adult) (pediatric): Secondary | ICD-10-CM | POA: Diagnosis not present

## 2019-01-30 DIAGNOSIS — I110 Hypertensive heart disease with heart failure: Secondary | ICD-10-CM | POA: Insufficient documentation

## 2019-01-30 DIAGNOSIS — Z951 Presence of aortocoronary bypass graft: Secondary | ICD-10-CM | POA: Diagnosis not present

## 2019-01-30 NOTE — Progress Notes (Signed)
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 The patient was initially diagnosed with atrial fibrillation several years ago and is s/p afib ablation 06/25/14 at [facility] by Dr. Lehman Prom and has been maintained on Tikosyn since. He has had some possible a fib, atrial flutter vs ectopic atrial rhythm. On 12/07/18, he was noted to be in an ectopic atrial rhythm with 2:1 block and was cardioverted on 12/11/18. On review of his ECGs, it appears he had some transient AV dissociation post cardioversion but is back in SR today. He has been asymptomatic. He denies bleeding issues with anticoagulation. He has OSA and struggles with using his CPAP. He sees Dr Radford Pax for this.  F/u in afib clinic 2/4 at request of Dr. Radford Pax . He saw Dr.Turner yesterday for intolerance to biPap and she found him to be out of rhythm and referred here. He remains on Tikosyn but ekg today appears to be atypical  atrial flutter. He is not symptomatic with this.  Today, he denies symptoms of palpitations, chest pain, shortness of breath, orthopnea, PND, lower extremity edema, dizziness, presyncope, syncope, snoring, daytime somnolence, bleeding, or neurologic sequela. The patient is tolerating medications without difficulties and is otherwise without complaint today.    Atrial Fibrillation Risk Factors:  he does have symptoms or diagnosis of sleep apnea. he is not compliant with CPAP therapy.   he has a BMI of Body mass index is 19.37 kg/m.[name] Filed Weights   01/30/19 0837  Weight: 54.4 kg    LA size: 48mm   Atrial Fibrillation Management history:  Previous antiarrhythmic drugs: Tikosyn  Previous cardioversions: 12/11/18  Previous  ablations: 2015 at [facility] score: 6 (age, HTN, CAD, CVA)  Anticoagulation history: Xarelto   Past Medical History:  Diagnosis Date  . Abnormal nuclear cardiac imaging test March 2011   Has positive EKG response, no perfusion defect and normal EF  . Adenomatous colon polyp 12/2002  . Atrial fibrillation ([place])    a. s/p rfca;  b. chronic tikosyn and xarelto.  . 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 Cataract    bil cataracts removed  . Chronic diastolic CHF (congestive heart failure) ([place])    a. 04/2017 Echo: EF 65-70%, restrictive physiology, mildly dil LA.  [name] COPD (chronic obstructive pulmonary disease) ([place])   . Coronary artery disease    First MI in 1988. PCI in 1996 with subsequent CABG in 1996 due to restenosis. S/P stents to LCX in 2009. Noted to have residual disease in the LAD in a diffuse manner and atretic LIMA graft. He is managed medically.   . Esophageal stricture   . GERD (gastroesophageal reflux disease)   . 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 History of Raynaud's syndrome   . Hyperlipidemia   . Hypertension   . Internal hemorrhoids   . Myocardial infarction [facility])    MI x1 89 - 58 age  . Neuromuscular disorder ([place])    Polio - age 30  . OSA (obstructive sleep apnea) 10/16/2018   Severe OSA with AHI 37.2/hr on BiPAP  . Persistent atrial fibrillation   . Polio    age 31  . PVC (premature ventricular contraction)   .  Scoliosis    mild   Past Surgical History:  Procedure Laterality Date  . ABLATION     done for a fib  . APPENDECTOMY     Age 37  . CARDIOVERSION N/A 08/22/2013   Procedure: CARDIOVERSION;  Surgeon: Carlena Bjornstad, MD;  Location: [facility] ENDOSCOPY;  Service: Cardiovascular;  Laterality: N/A;  . CARDIOVERSION N/A 01/16/2014   Procedure: CARDIOVERSION;  Surgeon: Lelon Perla, MD;  Location: [facility] ENDOSCOPY;  Service: Cardiovascular;  Laterality: N/A;  . CARDIOVERSION N/A 12/11/2018   Procedure:  CARDIOVERSION;  Surgeon: Larey Dresser, MD;  Location: [facility] ENDOSCOPY;  Service: Cardiovascular;  Laterality: N/A;  . COLONOSCOPY  2008  . CORONARY ARTERY BYPASS GRAFT  1996   with a LIMA to the LAD, SVG to RCA and SVG to OM  . CORONARY STENT PLACEMENT  2009    LCX  . POLYPECTOMY    . TONSILLECTOMY    . UPPER GASTROINTESTINAL ENDOSCOPY      Current Outpatient Medications  Medication Sig Dispense Refill  . acetaminophen (TYLENOL) 500 MG tablet Take 500 mg by mouth every 12 (twelve) hours.    [name] albuterol (PROAIR HFA) 108 (90 BASE) MCG/ACT inhaler Inhale 2 puffs into the lungs every 4 (four) hours as needed for wheezing or shortness of breath (and prior to exercise). 1 Inhaler 3  . AMBULATORY NON FORMULARY MEDICATION Apply 1 application topically daily. Testosterone cream 150mg /15% Place 15% onto the skin daily     . B Complex Vitamins (VITAMIN B COMPLEX PO) Take 1 tablet by mouth daily.    [name] BYSTOLIC 10 MG tablet TAKE 1 TABLET BY MOUTH EVERY DAY 30 tablet 8  . cetirizine (ZYRTEC) 10 MG tablet Take 10 mg by mouth daily as needed for allergies.     [name] dicyclomine (BENTYL) 10 MG capsule Take 1 capsule (10 mg total) by mouth 3 (three) times daily before meals. (Patient taking differently: Take 10 mg by mouth as needed. ) 90 capsule 11  . diltiazem (CARDIZEM CD) 120 MG 24 hr capsule TAKE ONE CAPSULE BY MOUTH ONCE EACH DAY. 90 capsule 3  . dofetilide (TIKOSYN) 250 MCG capsule TAKE 1 CAPSULE BY MOUTH EVERY 12 HOURS *NEED OFFICE VISIT* 180 capsule 3  . ezetimibe (ZETIA) 10 MG tablet TAKE 1 TABLET BY MOUTH EVERY DAY 90 tablet 0  . famotidine (PEPCID) 40 MG tablet Take 1 tablet (40 mg total) by mouth at bedtime. 30 tablet 11  . furosemide (LASIX) 40 MG tablet Take 40 mg by mouth as needed.    . lactobacillus acidophilus & bulgar (LACTINEX) chewable tablet Chew 2 tablets by mouth daily.     [name] loperamide (IMODIUM) 2 MG capsule Take by mouth as needed for diarrhea or loose stools.    [name] LORazepam  (ATIVAN) 0.5 MG tablet Take 0.5 mg by mouth at bedtime as needed for sleep.     . methocarbamol (ROBAXIN) 500 MG tablet Take 500 mg by mouth 3 (three) times daily as needed for muscle spasms.     . potassium chloride SA (K-DUR,KLOR-CON) 20 MEQ tablet Take 20 mEq by mouth daily.    . Probiotic Product (ALIGN) 4 MG CAPS Take 4 mg by mouth daily.    . Rivaroxaban (XARELTO) 15 MG TABS tablet Take 1 tablet (15 mg total) by mouth every evening. 90 tablet 3  . rosuvastatin (CRESTOR) 40 MG tablet Take 40 mg by mouth every evening.    . traZODone (DESYREL) 50 MG tablet Take 50 mg by  mouth at bedtime.    . Vitamin D, Ergocalciferol, (DRISDOL) 50000 UNITS CAPS capsule Take 50,000 Units by mouth every 14 (fourteen) days. Take on 1st and 15th     No current facility-administered medications for this encounter.     Allergies  Allergen Reactions  . Morphine And Related Other (See Comments)    IV forms- vein irritation    Social History   Socioeconomic History  . Marital status: Married    Spouse name: Not on file  . Number of children: 2  . Years of education: Not on file  . Highest education level: Not on file  Occupational History  . Occupation: Software engineer  Social Needs  . Financial resource strain: Not on file  . Food insecurity:    Worry: Not on file    Inability: Not on file  . Transportation needs:    Medical: Not on file    Non-medical: Not on file  Tobacco Use  . Smoking status: Former Smoker    Packs/day: 0.50    Years: 5.00    Pack years: 2.50    Types: Cigarettes    Last attempt to quit: 12/27/1966    Years since quitting: 52.1  . Smokeless tobacco: Former Systems developer    Quit date: 1950  Substance and Sexual Activity  . Alcohol use: No  . Drug use: No  . Sexual activity: Yes  Lifestyle  . Physical activity:    Days per week: Not on file    Minutes per session: Not on file  . Stress: Not on file  Relationships  . Social connections:    Talks on phone: Not on file    Gets  together: Not on file    Attends religious service: Not on file    Active member of club or organization: Not on file    Attends meetings of clubs or organizations: Not on file    Relationship status: Not on file  . Intimate partner violence:    Fear of current or ex partner: Not on file    Emotionally abused: Not on file    Physically abused: Not on file    Forced sexual activity: Not on file  Other Topics Concern  . 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 Heart disease Mother   . Heart disease Father   . Heart Problems Brother        heart transplant  . Heart disease Maternal Grandmother   . Heart disease Maternal Grandfather   . Heart disease Paternal Grandmother   . Heart disease Paternal Grandfather   . Prostate cancer Paternal Uncle   . Cancer Son        thymic cancer.  died at age 46  . Colon cancer Neg Hx   . Esophageal cancer Neg Hx   . Pancreatic cancer Neg Hx   . Rectal cancer Neg Hx   . Stomach cancer Neg Hx     ROS- All systems are reviewed and negative except as per the HPI above.  Physical Exam: Vitals:   01/30/19 0837  BP: 120/76  Pulse: (!) 102  Weight: 54.4 kg  Height: 5\' 6"  (1.676 m)    GEN- The patient is well appearing, alert and oriented x 3 today.   Head- normocephalic, atraumatic Eyes-  Sclera clear, conjunctiva pink Ears-  hearing intact Oropharynx- clear Neck- supple  Lungs- Clear to ausculation bilaterally, normal work of breathing Heart-irregular rate and rhythm, no murmurs, rubs or gallops  GI- soft, NT, ND, + BS Extremities- no clubbing, cyanosis, or edema MS- no significant deformity or atrophy Skin- no rash or lesion Psych- euthymic mood, full affect Neuro- strength and sensation are intact  Wt Readings from Last 3 Encounters:  01/30/19 54.4 kg  01/29/19 55.9 kg  01/04/19 54.4 kg     EKG today demonstrates possible atypical atrial flutter at 102 bpm Echo 05/21/17 demonstrated  - Left ventricle: The cavity size was normal. There was mild focal   basal hypertrophy of the septum. Systolic function was vigorous.   The estimated ejection fraction was in the range of 65% to 70%.   Doppler parameters are consistent with restrictive physiology,   indicative of decreased left ventricular diastolic compliance   and/or increased left atrial pressure. Doppler parameters are   consistent with elevated ventricular end-diastolic filling   pressure. - Aortic valve: Structurally normal valve. There was trivial   regurgitation. - Ascending aorta: The ascending aorta was normal in size. - Mitral valve: There was mild regurgitation. Valve area by   continuity equation (using LVOT flow): 1.36 cm^2. - Left atrium: The atrium was mildly dilated. - Right ventricle: The cavity size was moderately dilated. Wall   thickness was normal. Systolic function was normal. - Right atrium: The atrium was moderately dilated. - Tricuspid valve: There was mild regurgitation. - Pulmonary arteries: Systolic pressure was moderately increased.   PA peak pressure: 57 mm Hg (S). - Inferior vena cava: The vessel was dilated. The respirophasic   diameter changes were blunted (< 50%), consistent with elevated   central venous pressure. - Pericardium, extracardiac: The pericardium was normal in   appearance.  EKG- atypical atrial aflutter, vrs ectopic atrial  Tachycardia at 102 bpm, qtc at 552 but is is range when ins SR  Assessment and Plan:  1. Persistent atrial fibrillation The patient has asymptomatic persistent atypical atrial flutter/ectopic rhythm  The patients CHAD2VASC score is 6.  he is  appropriately anticoagulated at this time on Xarelto. S/p  successful DCCV on 12/11/18. Transient AV dissociation afterwards, but in SR with normal qtc when I saw him 12/15/18. He now has had return of  aflutter, on tikosyn, and I do not feel another cardioversion is the way to address this  I discussed with [name] and I will get a consult with Dr. Curt Bears to discuss repeat ablation. Pt is in agreement since Dr. Lehman Prom is no longer at [facility]. Continue Xarelto 20 mg BID Continue dofetilide 250 mcg BID  Continue diltiazem 120 mg daily and Bystolic 10 mg daily   2. Obstructive sleep apnea The importance of adequate treatment of sleep apnea was discussed today in order to improve our ability to maintain sinus rhythm long term.  he can no tolerate the mask He states that Dr. Radford Pax will refer him to Dr. Toy Cookey to be considered for an oral appliance  3. CAD No anginal symptoms. Continue present therapy.   4. HTN Stable, no changes today  Appointment  requested with Dr. Gaynelle Cage C. Verl Whitmore, [facility] [address] [place], [place] [address] [phone]

## 2019-02-06 ENCOUNTER — Other Ambulatory Visit (HOSPITAL_COMMUNITY): Payer: Self-pay | Admitting: Cardiology

## 2019-02-19 ENCOUNTER — Encounter: Payer: Self-pay | Admitting: Cardiology

## 2019-02-19 ENCOUNTER — Ambulatory Visit (INDEPENDENT_AMBULATORY_CARE_PROVIDER_SITE_OTHER): Payer: Medicare Other | Admitting: Cardiology

## 2019-02-19 VITALS — BP 126/62 | HR 98 | Ht 66.0 in | Wt 119.0 lb

## 2019-02-19 DIAGNOSIS — I4819 Other persistent atrial fibrillation: Secondary | ICD-10-CM

## 2019-02-19 NOTE — Patient Instructions (Signed)
Medication Instructions:    Your physician recommends that you continue on your current medications as directed. Please refer to the Current Medication list given to you today.  - If you need a refill on your cardiac medications before your next appointment, please call your pharmacy.   Labwork:  None ordered  Testing/Procedures:  None ordered  Follow-Up:  Your physician recommends that you schedule a follow-up appointment in: 3 months with Dr. Camnitz.  Thank you for choosing [facility]!!   Neka Bise, RN [phone]         

## 2019-02-19 NOTE — Progress Notes (Signed)
Electrophysiology Office Note   Date:  02/19/2019   ID:  Kimi, Kroft 1936-01-09, MRN [id number]  PCP:  Burnard Bunting, MD  Cardiologist:  Aundra Dubin Primary Electrophysiologist:   Meredith Leeds, MD    No chief complaint on file.    History of Present Illness: [name] is a 83 y.o. male who is being seen today for the evaluation of atrial fibrillation/flutter at the request of Sherran Needs, NP. Presenting today for electrophysiology evaluation.  He has a history of persistent atrial fibrillation, coronary disease, COPD, CVA, OSA, hypertension, hyperlipidemia.  He was diagnosed with atrial fibrillation several years ago and is status post ablation in 2015 by Dr. Lehman Prom.  He had been on Tikosyn since that time.  On 219 he was in an ectopic atrial rhythm with 2-1 heart block and was cardioverted 12/11/2018.  He has sleep apnea and is on BiPAP.  He was seen by Dr. Radford Pax who found him to be out of rhythm and referred him to atrial fibrillation clinic.  His EKG appeared to be consistent with atypical atrial flutter.    Today, he denies symptoms of palpitations, chest pain, shortness of breath, orthopnea, PND, lower extremity edema, claudication, dizziness, presyncope, syncope, bleeding, or neurologic sequela. The patient is tolerating medications without difficulties.  He tells me that he feels well.  He is able to do all of his daily activities.  He is minimally symptomatic from his atrial flutter.   Past Medical History:  Diagnosis Date  . Abnormal nuclear cardiac imaging test March 2011   Has positive EKG response, no perfusion defect and normal EF  . Adenomatous colon polyp 12/2002  . Atrial fibrillation ([place])    a. s/p rfca;  b. chronic tikosyn and xarelto.  . 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 Cataract    bil cataracts removed  . Chronic diastolic CHF (congestive heart failure) ([place])    a. 04/2017 Echo: EF 65-70%, restrictive  physiology, mildly dil LA.  [name] COPD (chronic obstructive pulmonary disease) ([place])   . Coronary artery disease    First MI in 1988. PCI in 1996 with subsequent CABG in 1996 due to restenosis. S/P stents to LCX in 2009. Noted to have residual disease in the LAD in a diffuse manner and atretic LIMA graft. He is managed medically.   . Esophageal stricture   . GERD (gastroesophageal reflux disease)   . 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 History of Raynaud's syndrome   . Hyperlipidemia   . Hypertension   . Internal hemorrhoids   . Myocardial infarction [facility])    MI x1 70 - 47 age  . Neuromuscular disorder ([place])    Polio - age 11  . OSA (obstructive sleep apnea) 10/16/2018   Severe OSA with AHI 37.2/hr on BiPAP  . Persistent atrial fibrillation   . Polio    age 78  . PVC (premature ventricular contraction)   . Scoliosis    mild   Past Surgical History:  Procedure Laterality Date  . ABLATION     done for a fib  . APPENDECTOMY     Age 5  . CARDIOVERSION N/A 08/22/2013   Procedure: CARDIOVERSION;  Surgeon: Carlena Bjornstad, MD;  Location: [facility] ENDOSCOPY;  Service: Cardiovascular;  Laterality: N/A;  . CARDIOVERSION N/A 01/16/2014   Procedure: CARDIOVERSION;  Surgeon: Lelon Perla, MD;  Location: [place];  Service: Cardiovascular;  Laterality: N/A;  .  CARDIOVERSION N/A 12/11/2018   Procedure: CARDIOVERSION;  Surgeon: Larey Dresser, MD;  Location: [facility] ENDOSCOPY;  Service: Cardiovascular;  Laterality: N/A;  . COLONOSCOPY  2008  . CORONARY ARTERY BYPASS GRAFT  1996   with a LIMA to the LAD, SVG to RCA and SVG to OM  . CORONARY STENT PLACEMENT  2009    LCX  . POLYPECTOMY    . TONSILLECTOMY    . UPPER GASTROINTESTINAL ENDOSCOPY       Current Outpatient Medications  Medication Sig Dispense Refill  . acetaminophen (TYLENOL) 500 MG tablet Take 500 mg by mouth every 12 (twelve) hours.    [name] albuterol (PROAIR HFA) 108 (90 BASE) MCG/ACT inhaler Inhale 2 puffs into the lungs  every 4 (four) hours as needed for wheezing or shortness of breath (and prior to exercise). 1 Inhaler 3  . AMBULATORY NON FORMULARY MEDICATION Apply 1 application topically daily. Testosterone cream 150mg /15% Place 15% onto the skin daily     . B Complex Vitamins (VITAMIN B COMPLEX PO) Take 1 tablet by mouth daily.    [name] BYSTOLIC 10 MG tablet TAKE 1 TABLET BY MOUTH EVERY DAY 30 tablet 8  . cetirizine (ZYRTEC) 10 MG tablet Take 10 mg by mouth daily as needed for allergies.     [name] dicyclomine (BENTYL) 10 MG capsule Take 1 capsule (10 mg total) by mouth 3 (three) times daily before meals. (Patient taking differently: Take 10 mg by mouth as needed. ) 90 capsule 11  . diltiazem (CARDIZEM CD) 120 MG 24 hr capsule TAKE ONE CAPSULE BY MOUTH ONCE EACH DAY. 90 capsule 3  . dofetilide (TIKOSYN) 250 MCG capsule TAKE 1 CAPSULE BY MOUTH EVERY 12 HOURS *NEED OFFICE VISIT* 180 capsule 3  . ezetimibe (ZETIA) 10 MG tablet TAKE 1 TABLET BY MOUTH EVERY DAY 90 tablet 0  . famotidine (PEPCID) 40 MG tablet Take 1 tablet (40 mg total) by mouth at bedtime. 30 tablet 11  . furosemide (LASIX) 40 MG tablet Take 40 mg by mouth as needed.    . lactobacillus acidophilus & bulgar (LACTINEX) chewable tablet Chew 2 tablets by mouth daily.     [name] loperamide (IMODIUM) 2 MG capsule Take by mouth as needed for diarrhea or loose stools.    [name] LORazepam (ATIVAN) 0.5 MG tablet Take 0.5 mg by mouth at bedtime as needed for sleep.     . methocarbamol (ROBAXIN) 500 MG tablet Take 500 mg by mouth 3 (three) times daily as needed for muscle spasms.     . potassium chloride SA (K-DUR,KLOR-CON) 20 MEQ tablet Take 20 mEq by mouth daily.    . Probiotic Product (ALIGN) 4 MG CAPS Take 4 mg by mouth daily.    . Rivaroxaban (XARELTO) 15 MG TABS tablet Take 1 tablet (15 mg total) by mouth every evening. 90 tablet 3  . rosuvastatin (CRESTOR) 40 MG tablet Take 40 mg by mouth every evening.    . traZODone (DESYREL) 50 MG tablet Take 50 mg by mouth at  bedtime.    . Vitamin D, Ergocalciferol, (DRISDOL) 50000 UNITS CAPS capsule Take 50,000 Units by mouth every 14 (fourteen) days. Take on 1st and 15th     No current facility-administered medications for this visit.     Allergies:   Morphine and related   Social History:  The patient  reports that he quit smoking about 52 years ago. His smoking use included cigarettes. He has a 2.50 pack-year smoking history. He quit smokeless tobacco use about  70 years ago. He reports that he does not drink alcohol or use drugs.   Family History:  The patient's family history includes Cancer in his son; Heart Problems in his brother; Heart disease in his father, maternal grandfather, maternal grandmother, mother, paternal grandfather, and paternal grandmother; Prostate cancer in his paternal uncle.    ROS:  Please see the history of present illness.   Otherwise, review of systems is positive for leg pain, palpitations, shortness of breath, snoring, abdominal pain, diarrhea, muscle pain, headaches, bleeding.   All other systems are reviewed and negative.    PHYSICAL EXAM: VS:  BP 126/62   Pulse 98   Ht 5\' 6"  (1.676 m)   Wt 119 lb (54 kg)   BMI 19.21 kg/m  , BMI Body mass index is 19.21 kg/m. GEN: Well nourished, well developed, in no acute distress  HEENT: normal  Neck: no JVD, carotid bruits, or masses Cardiac: Cardiac, regular; no murmurs, rubs, or gallops,no edema  Respiratory:  clear to auscultation bilaterally, normal work of breathing GI: soft, nontender, nondistended, + BS MS: no deformity or atrophy  Skin: warm and dry Neuro:  Strength and sensation are intact Psych: euthymic mood, full affect  EKG:  EKG is ordered today. Personal review of the ekg ordered shows atrial flutter, rate 98  Recent Labs: 12/07/2018: BUN 17; Creatinine, Ser 1.43; Hemoglobin 15.7; Platelets 162; Potassium 4.3; Sodium 138 12/15/2018: Magnesium 2.4 01/04/2019: ALT 26    Lipid Panel     Component Value  Date/Time   CHOL 124 12/07/2018 1006   TRIG 47 12/07/2018 1006   HDL 53 12/07/2018 1006   CHOLHDL 2.3 12/07/2018 1006   VLDL 9 12/07/2018 1006   LDLCALC 62 12/07/2018 1006     Wt Readings from Last 3 Encounters:  02/19/19 119 lb (54 kg)  01/30/19 120 lb (54.4 kg)  01/29/19 123 lb 3.2 oz (55.9 kg)      Other studies Reviewed: Additional studies/ records that were reviewed today include: TTE 12/29/18  Review of the above records today demonstrates:  - Left ventricle: The cavity size was normal. Systolic function was   normal. The estimated ejection fraction was in the range of 55%   to 60%. Hypokinesis of the basalanteroseptal and inferoseptal   myocardium. The study is not technically sufficient to allow   evaluation of LV diastolic function. - Aortic valve: Valve mobility was restricted. Transvalvular   velocity was within the normal range. There was no stenosis.   There was no regurgitation. Mean gradient (S): 8 mm Hg. Valve   area (VTI): 1.28 cm^2. Valve area (Vmax): 1.28 cm^2. Valve area   (Vmean): 1.26 cm^2. - Mitral valve: Transvalvular velocity was within the normal range.   There was no evidence for stenosis. There was mild regurgitation. - Left atrium: The atrium was moderately dilated. - Right ventricle: The cavity size was normal. Wall thickness was   normal. Systolic function was normal. - Tricuspid valve: There was mild regurgitation. - Pulmonary arteries: Systolic pressure was within the normal   range. PA peak pressure: 35 mm Hg (S).   ASSESSMENT AND PLAN:  1.  Persistent atrial fibrillation/atypical atrial flutter: Currently on Xarelto, dofetilide, diltiazem, Bystolic.  Has had a prior ablation at [facility] with Dr. Lehman Prom.  Is in an atypical atrial flutter today.  We  work to get his prior ablation records from [facility].  At this point with his untreated sleep apnea, I am concerned that he would not have much in  the way of benefit from an atrial flutter ablation.  He  is getting fitted for a mouthpiece to see if this  help.  I  plan to see him back in 3 months for further discussions.   discuss with his primary cardiologist.  This patients CHA2DS2-VASc Score and unadjusted Ischemic Stroke Rate (% per year) is equal to 9.7 % stroke rate/year from a score of 6  Above score calculated as 1 point each if present [CHF, HTN, DM, Vascular=MI/PAD/Aortic Plaque, Age if 65-74, or Male] Above score calculated as 2 points each if present [Age > 75, or Stroke/TIA/TE]  2.  Obstructive sleep apnea: He has been referred for an oral appliance as he cannot tolerate the facemask.  I would like him to be more compliant with his sleep apnea therapy, as ablation for atrial fibrillation without proper treatment of sleep apnea is significantly less successful.  3.  Coronary artery disease: Status post CABG.  No current chest pain.  4.  Hypertension: Well-controlled    Current medicines are reviewed at length with the patient today.   The patient does not have concerns regarding his medicines.  The following changes were made today:  none  Labs/ tests ordered today include:  Orders Placed This Encounter  Procedures  . EKG 12-Lead     Disposition:   FU with   3 months  Signed,  Meredith Leeds, MD  02/19/2019 10:47 AM     [facility] [address] [place] [address] [phone] (office) [phone] (fax)

## 2019-02-19 NOTE — Telephone Encounter (Signed)
RE: Jimmy Fellers, MD  Lucia Estelle, CMA; Arsenio Katz        Please send patient a certified letter to tell him he needs to call     ----- Message -----  From: Rica Mote  Sent: 02/16/2019  1:29 PM EST  To: Sueanne Margarita, MD, Arsenio Katz, *  Subject: [name] and Dr Radford Pax!   I got a message from our RT team for pt [name], DOB 02-Apr-1936. They have attempted several times to reach the patient and left voicemails to set up the ONO but have been unsuccessful. At this point, they will need to call us at [phone] ext [phone] to get this set up.   Thank you!

## 2019-02-21 ENCOUNTER — Institutional Professional Consult (permissible substitution): Payer: Medicare Other | Admitting: Cardiology

## 2019-02-21 ENCOUNTER — Encounter

## 2019-02-27 ENCOUNTER — Other Ambulatory Visit: Payer: Self-pay | Admitting: Cardiology

## 2019-02-27 ENCOUNTER — Telehealth: Payer: Self-pay | Admitting: Cardiology

## 2019-02-27 NOTE — Telephone Encounter (Signed)
Medical records requested from [facility]. 02/27/19 vlm

## 2019-03-02 ENCOUNTER — Telehealth: Payer: Self-pay | Admitting: Cardiology

## 2019-03-02 ENCOUNTER — Other Ambulatory Visit: Payer: Self-pay | Admitting: Cardiology

## 2019-03-02 NOTE — Telephone Encounter (Signed)
Medical records received from [facility]. 03/02/19 vlm

## 2019-03-08 ENCOUNTER — Telehealth: Payer: Self-pay | Admitting: Emergency Medicine

## 2019-03-08 ENCOUNTER — Telehealth: Payer: Self-pay | Admitting: Cardiology

## 2019-03-08 DIAGNOSIS — G4733 Obstructive sleep apnea (adult) (pediatric): Secondary | ICD-10-CM

## 2019-03-08 DIAGNOSIS — G4734 Idiopathic sleep related nonobstructive alveolar hypoventilation: Secondary | ICD-10-CM

## 2019-03-08 NOTE — Telephone Encounter (Signed)
Patient's wife called stating she needs a new order sent for overnight oxygen test.

## 2019-03-08 NOTE — Telephone Encounter (Signed)
Called patient unable to reach LMTCB 

## 2019-03-08 NOTE — Telephone Encounter (Signed)
Pts wife called to report that [name] had handed their O2 care over to [name] ... a new "branch" and they need a new order for the pts overnight Oxygen ordered by [name] at his last OV... spoke with [name]...[phone]  EXT. [phone] and he needs a new order.. will forward to [name] for the order and will call him back with the specific instructions.   Pt is receiving his Oral Device from [name] recommended by [name] tomorrow 03/09/19.

## 2019-03-09 NOTE — Telephone Encounter (Signed)
Spoke with the pt's spouse  She wants to know if ok to buy an air purifier for the home  I advised this is fine  Rb does not rec any certain brand  Nothing further needed

## 2019-03-09 NOTE — Telephone Encounter (Signed)
Pt's wife is returning call. Cb is [phone].

## 2019-03-11 NOTE — Telephone Encounter (Signed)
Please order O2 at [address]L [place] nightly

## 2019-03-12 DIAGNOSIS — R040 Epistaxis: Secondary | ICD-10-CM | POA: Diagnosis not present

## 2019-03-12 DIAGNOSIS — J342 Deviated nasal septum: Secondary | ICD-10-CM | POA: Diagnosis not present

## 2019-03-12 DIAGNOSIS — J343 Hypertrophy of nasal turbinates: Secondary | ICD-10-CM | POA: Diagnosis not present

## 2019-03-12 DIAGNOSIS — G4733 Obstructive sleep apnea (adult) (pediatric): Secondary | ICD-10-CM | POA: Diagnosis not present

## 2019-03-12 NOTE — Telephone Encounter (Signed)
Order placed via community message today.

## 2019-03-25 ENCOUNTER — Other Ambulatory Visit: Payer: Self-pay

## 2019-03-25 ENCOUNTER — Emergency Department (HOSPITAL_COMMUNITY): Payer: Medicare Other

## 2019-03-25 ENCOUNTER — Encounter (HOSPITAL_COMMUNITY): Payer: Self-pay

## 2019-03-25 ENCOUNTER — Encounter: Payer: Self-pay | Admitting: Cardiology

## 2019-03-25 ENCOUNTER — Inpatient Hospital Stay (HOSPITAL_COMMUNITY)
Admission: EM | Admit: 2019-03-25 | Discharge: 2019-03-27 | DRG: 247 | Disposition: A | Discharge status: Home / Self Care | Payer: Medicare Other | Attending: Cardiology | Admitting: Cardiology

## 2019-03-25 DIAGNOSIS — Z955 Presence of coronary angioplasty implant and graft: Secondary | ICD-10-CM

## 2019-03-25 DIAGNOSIS — I214 Non-ST elevation (NSTEMI) myocardial infarction: Principal | ICD-10-CM | POA: Diagnosis present

## 2019-03-25 DIAGNOSIS — G14 Postpolio syndrome: Secondary | ICD-10-CM | POA: Diagnosis present

## 2019-03-25 DIAGNOSIS — I471 Supraventricular tachycardia: Secondary | ICD-10-CM | POA: Diagnosis present

## 2019-03-25 DIAGNOSIS — Z7901 Long term (current) use of anticoagulants: Secondary | ICD-10-CM

## 2019-03-25 DIAGNOSIS — G4733 Obstructive sleep apnea (adult) (pediatric): Secondary | ICD-10-CM | POA: Diagnosis present

## 2019-03-25 DIAGNOSIS — M419 Scoliosis, unspecified: Secondary | ICD-10-CM | POA: Diagnosis present

## 2019-03-25 DIAGNOSIS — J449 Chronic obstructive pulmonary disease, unspecified: Secondary | ICD-10-CM | POA: Diagnosis present

## 2019-03-25 DIAGNOSIS — G902 Horner's syndrome: Secondary | ICD-10-CM | POA: Diagnosis present

## 2019-03-25 DIAGNOSIS — I7300 Raynaud's syndrome without gangrene: Secondary | ICD-10-CM | POA: Diagnosis present

## 2019-03-25 DIAGNOSIS — E785 Hyperlipidemia, unspecified: Secondary | ICD-10-CM | POA: Diagnosis present

## 2019-03-25 DIAGNOSIS — Z8249 Family history of ischemic heart disease and other diseases of the circulatory system: Secondary | ICD-10-CM

## 2019-03-25 DIAGNOSIS — I252 Old myocardial infarction: Secondary | ICD-10-CM

## 2019-03-25 DIAGNOSIS — I25110 Atherosclerotic heart disease of native coronary artery with unstable angina pectoris: Secondary | ICD-10-CM | POA: Diagnosis present

## 2019-03-25 DIAGNOSIS — I484 Atypical atrial flutter: Secondary | ICD-10-CM | POA: Diagnosis not present

## 2019-03-25 DIAGNOSIS — Z8673 Personal history of transient ischemic attack (TIA), and cerebral infarction without residual deficits: Secondary | ICD-10-CM | POA: Diagnosis not present

## 2019-03-25 DIAGNOSIS — Z802 Family history of malignant neoplasm of other respiratory and intrathoracic organs: Secondary | ICD-10-CM | POA: Diagnosis not present

## 2019-03-25 DIAGNOSIS — I200 Unstable angina: Secondary | ICD-10-CM | POA: Diagnosis not present

## 2019-03-25 DIAGNOSIS — K219 Gastro-esophageal reflux disease without esophagitis: Secondary | ICD-10-CM | POA: Diagnosis present

## 2019-03-25 DIAGNOSIS — N183 Chronic kidney disease, stage 3 (moderate): Secondary | ICD-10-CM | POA: Diagnosis present

## 2019-03-25 DIAGNOSIS — I25710 Atherosclerosis of autologous vein coronary artery bypass graft(s) with unstable angina pectoris: Secondary | ICD-10-CM | POA: Diagnosis present

## 2019-03-25 DIAGNOSIS — Z87891 Personal history of nicotine dependence: Secondary | ICD-10-CM

## 2019-03-25 DIAGNOSIS — R072 Precordial pain: Secondary | ICD-10-CM

## 2019-03-25 DIAGNOSIS — Z885 Allergy status to narcotic agent status: Secondary | ICD-10-CM | POA: Diagnosis not present

## 2019-03-25 DIAGNOSIS — I4819 Other persistent atrial fibrillation: Secondary | ICD-10-CM | POA: Diagnosis present

## 2019-03-25 DIAGNOSIS — R0602 Shortness of breath: Secondary | ICD-10-CM | POA: Diagnosis not present

## 2019-03-25 DIAGNOSIS — Z951 Presence of aortocoronary bypass graft: Secondary | ICD-10-CM

## 2019-03-25 DIAGNOSIS — I361 Nonrheumatic tricuspid (valve) insufficiency: Secondary | ICD-10-CM | POA: Diagnosis not present

## 2019-03-25 DIAGNOSIS — I130 Hypertensive heart and chronic kidney disease with heart failure and stage 1 through stage 4 chronic kidney disease, or unspecified chronic kidney disease: Secondary | ICD-10-CM | POA: Diagnosis present

## 2019-03-25 DIAGNOSIS — Z8042 Family history of malignant neoplasm of prostate: Secondary | ICD-10-CM | POA: Diagnosis not present

## 2019-03-25 DIAGNOSIS — I5032 Chronic diastolic (congestive) heart failure: Secondary | ICD-10-CM | POA: Diagnosis present

## 2019-03-25 DIAGNOSIS — R079 Chest pain, unspecified: Secondary | ICD-10-CM | POA: Diagnosis not present

## 2019-03-25 LAB — APTT: aPTT: 39 seconds — ABNORMAL HIGH (ref 24–36)

## 2019-03-25 LAB — CBC
HCT: 49.8 % (ref 39.0–52.0)
Hemoglobin: 16.2 g/dL (ref 13.0–17.0)
MCH: 32.3 pg (ref 26.0–34.0)
MCHC: 32.5 g/dL (ref 30.0–36.0)
MCV: 99.2 fL (ref 80.0–100.0)
Platelets: 174 10*3/uL (ref 150–400)
RBC: 5.02 MIL/uL (ref 4.22–5.81)
RDW: 13.3 % (ref 11.5–15.5)
WBC: 6.6 10*3/uL (ref 4.0–10.5)
nRBC: 0 % (ref 0.0–0.2)

## 2019-03-25 LAB — BASIC METABOLIC PANEL
Anion gap: 13 (ref 5–15)
BUN: 19 mg/dL (ref 8–23)
CHLORIDE: 98 mmol/L (ref 98–111)
CO2: 24 mmol/L (ref 22–32)
Calcium: 10.1 mg/dL (ref 8.9–10.3)
Creatinine, Ser: 1.49 mg/dL — ABNORMAL HIGH (ref 0.61–1.24)
GFR calc Af Amer: 51 mL/min — ABNORMAL LOW (ref >60)
GFR calc non Af Amer: 44 mL/min — ABNORMAL LOW (ref >60)
Glucose, Bld: 123 mg/dL — ABNORMAL HIGH (ref 70–99)
POTASSIUM: 3.9 mmol/L (ref 3.5–5.1)
Sodium: 135 mmol/L (ref 135–145)

## 2019-03-25 LAB — TROPONIN I
Troponin I: <0.03 ng/mL (ref <0.03)
Troponin I: 0.8 ng/mL (ref <0.03)

## 2019-03-25 LAB — HEPARIN LEVEL (UNFRACTIONATED): Heparin Unfractionated: >2.2 IU/mL — ABNORMAL HIGH (ref 0.30–0.70)

## 2019-03-25 MED ORDER — ASPIRIN 81 MG PO CHEW
324.0000 mg | CHEWABLE_TABLET | Freq: Once | ORAL | Status: AC
  Administered 2019-03-25: 324 mg via ORAL

## 2019-03-25 MED ORDER — METHOCARBAMOL 500 MG PO TABS: 500.0000 mg | ORAL_TABLET | Freq: Three times a day (TID) | ORAL | Status: DC | PRN

## 2019-03-25 MED ORDER — DILTIAZEM HCL ER COATED BEADS 120 MG PO CP24
120.0000 mg | ORAL_CAPSULE | Freq: Every day | ORAL | Status: DC
  Administered 2019-03-26 – 2019-03-27 (×2): 120 mg via ORAL
  Filled 2019-03-25 (×2): qty 1

## 2019-03-25 MED ORDER — ASPIRIN EC 81 MG PO TBEC
81.0000 mg | DELAYED_RELEASE_TABLET | Freq: Every day | ORAL | Status: DC
  Administered 2019-03-27: 81 mg via ORAL
  Filled 2019-03-25: qty 1

## 2019-03-25 MED ORDER — TRAZODONE HCL 50 MG PO TABS
50.0000 mg | ORAL_TABLET | Freq: Every day | ORAL | Status: DC
  Administered 2019-03-27: 50 mg via ORAL
  Filled 2019-03-25: qty 1

## 2019-03-25 MED ORDER — NITROGLYCERIN 2 % TD OINT
1.0000 [in_us] | TOPICAL_OINTMENT | Freq: Once | TRANSDERMAL | Status: AC
  Administered 2019-03-25: 1 [in_us] via TOPICAL
  Filled 2019-03-25: qty 1

## 2019-03-25 MED ORDER — ALBUTEROL SULFATE HFA 108 (90 BASE) MCG/ACT IN AERS: 2.0000 | INHALATION_SPRAY | RESPIRATORY_TRACT | Status: DC | PRN

## 2019-03-25 MED ORDER — EZETIMIBE 10 MG PO TABS
10.0000 mg | ORAL_TABLET | Freq: Every day | ORAL | Status: DC
  Administered 2019-03-25 – 2019-03-26 (×2): 10 mg via ORAL
  Filled 2019-03-25 (×2): qty 1

## 2019-03-25 MED ORDER — ONDANSETRON HCL 4 MG/2ML IJ SOLN: 4.0000 mg | Freq: Four times a day (QID) | INTRAMUSCULAR | Status: DC | PRN

## 2019-03-25 MED ORDER — NITROGLYCERIN 0.4 MG SL SUBL
SUBLINGUAL_TABLET | SUBLINGUAL | Status: AC
  Filled 2019-03-25: qty 1

## 2019-03-25 MED ORDER — ROSUVASTATIN CALCIUM 20 MG PO TABS
40.0000 mg | ORAL_TABLET | Freq: Every evening | ORAL | Status: DC
  Administered 2019-03-25 – 2019-03-26 (×2): 40 mg via ORAL
  Filled 2019-03-25 (×3): qty 2

## 2019-03-25 MED ORDER — ACETAMINOPHEN 325 MG PO TABS
650.0000 mg | ORAL_TABLET | ORAL | Status: DC | PRN
  Administered 2019-03-26 (×2): 650 mg via ORAL
  Filled 2019-03-25 (×2): qty 2

## 2019-03-25 MED ORDER — RIVAROXABAN 15 MG PO TABS: 15.0000 mg | ORAL_TABLET | Freq: Every evening | ORAL | Status: DC

## 2019-03-25 MED ORDER — ALBUTEROL SULFATE (2.5 MG/3ML) 0.083% IN NEBU: 2.5000 mg | INHALATION_SOLUTION | RESPIRATORY_TRACT | Status: DC | PRN

## 2019-03-25 MED ORDER — HEPARIN (PORCINE) 25000 UT/250ML-% IV SOLN
850.0000 [IU]/h | INTRAVENOUS | Status: DC
  Administered 2019-03-25: 850 [IU]/h via INTRAVENOUS
  Filled 2019-03-25: qty 250

## 2019-03-25 MED ORDER — NITROGLYCERIN 0.4 MG SL SUBL
0.4000 mg | SUBLINGUAL_TABLET | SUBLINGUAL | Status: AC | PRN
  Administered 2019-03-25 – 2019-03-26 (×3): 0.4 mg via SUBLINGUAL
  Filled 2019-03-25 (×2): qty 1

## 2019-03-25 MED ORDER — LORAZEPAM 0.5 MG PO TABS
0.5000 mg | ORAL_TABLET | Freq: Every evening | ORAL | Status: DC | PRN
  Administered 2019-03-25: 0.5 mg via ORAL
  Filled 2019-03-25: qty 1

## 2019-03-25 MED ORDER — SODIUM CHLORIDE 0.9 % IV SOLN: 250.0000 mL | INTRAVENOUS | Status: DC | PRN

## 2019-03-25 MED ORDER — DOFETILIDE 250 MCG PO CAPS
250.0000 ug | ORAL_CAPSULE | Freq: Two times a day (BID) | ORAL | Status: DC
  Administered 2019-03-25 – 2019-03-27 (×4): 250 ug via ORAL
  Filled 2019-03-25 (×4): qty 1

## 2019-03-25 MED ORDER — NEBIVOLOL HCL 5 MG PO TABS
10.0000 mg | ORAL_TABLET | Freq: Every day | ORAL | Status: DC
  Administered 2019-03-26 – 2019-03-27 (×2): 10 mg via ORAL
  Filled 2019-03-25 (×3): qty 2

## 2019-03-25 MED ORDER — ASPIRIN 81 MG PO CHEW
CHEWABLE_TABLET | ORAL | Status: AC
  Administered 2019-03-25: 324 mg via ORAL
  Filled 2019-03-25: qty 4

## 2019-03-25 MED ORDER — SODIUM CHLORIDE 0.9 % IV SOLN
INTRAVENOUS | Status: DC
  Administered 2019-03-26: 05:00:00 via INTRAVENOUS

## 2019-03-25 MED ORDER — MORPHINE SULFATE (PF) 4 MG/ML IV SOLN: 4.0000 mg | INTRAVENOUS | Status: DC | PRN

## 2019-03-25 MED ORDER — ASPIRIN 81 MG PO CHEW
81.0000 mg | CHEWABLE_TABLET | ORAL | Status: AC
  Administered 2019-03-26: 81 mg via ORAL
  Filled 2019-03-25: qty 1

## 2019-03-25 MED ORDER — SODIUM CHLORIDE 0.9% FLUSH: 3.0000 mL | INTRAVENOUS | Status: DC | PRN

## 2019-03-25 MED ORDER — SODIUM CHLORIDE 0.9% FLUSH
3.0000 mL | Freq: Two times a day (BID) | INTRAVENOUS | Status: DC
  Administered 2019-03-25 (×2): 3 mL via INTRAVENOUS

## 2019-03-25 NOTE — ED Provider Notes (Signed)
[facility] EMERGENCY DEPARTMENT Provider Note   CSN: [id number] Arrival date & time: 03/25/19  1002    History   Chief Complaint No chief complaint on file.   HPI [name] is a 83 y.o. male.     Patient with history of atrial fibrillation/atrial flutter on anticoagulation, previous MI with stent placement, CABG, currently followed by [name], COPD --presents the emergency department today with acute onset of chest pain after waking up this morning at approximately 7:30 AM.  Patient describes a pressure pain in the mid chest with radiation to both arms and elbows.  He has some slight shortness of breath.  No diaphoresis or vomiting.  No treatments prior to arrival.  No fever or cough.  No leg swelling.  Course is constant.  Nothing makes symptoms better or worse.     Past Medical History:  Diagnosis Date   Abnormal nuclear cardiac imaging test March 2011   Has positive EKG response, no perfusion defect and normal EF   Adenomatous colon polyp 12/2002   Atrial fibrillation ([place])    a. s/p rfca;  b. chronic tikosyn and xarelto.   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 04/2017 Echo: EF 65-70%, restrictive physiology, mildly dil LA.   COPD (chronic obstructive pulmonary disease) ([facility])    Coronary artery disease    First MI in 1988. PCI in 1996 with subsequent CABG in 1996 due to restenosis. S/P stents to LCX in 2009. Noted to have residual disease in the LAD in a diffuse manner and atretic LIMA graft. He is managed medically.    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   History of Raynaud's syndrome    Hyperlipidemia    Hypertension    Internal hemorrhoids    Myocardial infarction [facility])    MI x1 28 - 32 age   Neuromuscular disorder ([place])    Polio - age 20   OSA (obstructive sleep apnea) 10/16/2018   Severe OSA with AHI 37.2/hr on BiPAP   Persistent atrial fibrillation    Polio    age 83   PVC (premature ventricular contraction)    Scoliosis    mild    Patient Active Problem List   Diagnosis Date Noted   OSA (obstructive sleep apnea) 10/16/2018   Chronic diastolic (congestive) heart failure ([place]) 05/20/2017   Allergic rhinitis 11/17/2015   Acute on chronic diastolic CHF (congestive heart failure), NYHA class 4 ([place]) 01/28/2015   Chest pain on exertion 11/14/2014   Cerebral artery occlusion 06/24/2014   Acid reflux 06/24/2014   H/O cardiovascular disorder 06/24/2014   H/O acute poliomyelitis 06/24/2014   Extrasystole 06/24/2014   A-fib ([place]) 06/24/2014   Cerebral artery occlusion with cerebral infarction ([place]) 06/24/2014   COPD (chronic obstructive pulmonary disease) ([place]) 05/02/2014   CAFL (chronic airflow limitation) ([place]) 05/02/2014   Atrial fibrillation ([place]) 11/11/2013   CAD (coronary artery disease) 09/01/2011   Hyperlipidemia 09/01/2011   Atherosclerosis of coronary artery 09/01/2011   History of Raynaud's syndrome    History of post-polio syndrome    GERD (gastroesophageal reflux disease)    PVC (premature ventricular contraction)    Brainstem stroke ([place])    Scoliosis     Past Surgical History:  Procedure Laterality Date  ABLATION     done for a fib   APPENDECTOMY     Age 42   CARDIOVERSION N/A 08/22/2013   Procedure: CARDIOVERSION;  Surgeon: Carlena Bjornstad, MD;  Location: [facility];  Service: Cardiovascular;  Laterality: N/A;   CARDIOVERSION N/A 01/16/2014   Procedure: CARDIOVERSION;  Surgeon: Lelon Perla, MD;  Location: [facility] ENDOSCOPY;  Service: Cardiovascular;  Laterality: N/A;   CARDIOVERSION N/A 12/11/2018   Procedure: CARDIOVERSION;  Surgeon: Larey Dresser, MD;  Location: [facility] ENDOSCOPY;  Service: Cardiovascular;  Laterality: N/A;   COLONOSCOPY  2008     CORONARY ARTERY BYPASS GRAFT  1996   with a LIMA to the LAD, SVG to RCA and SVG to OM   CORONARY STENT PLACEMENT  2009    LCX   POLYPECTOMY     TONSILLECTOMY     UPPER GASTROINTESTINAL ENDOSCOPY          Home Medications    Prior to Admission medications   Medication Sig Start Date End Date Taking? Authorizing Provider  acetaminophen (TYLENOL) 500 MG tablet Take 500 mg by mouth every 12 (twelve) hours.    [provider]  albuterol (PROAIR HFA) 108 (90 BASE) MCG/ACT inhaler Inhale 2 puffs into the lungs every 4 (four) hours as needed for wheezing or shortness of breath (and prior to exercise). 06/13/14   Collene Gobble, MD  AMBULATORY NON FORMULARY MEDICATION Apply 1 application topically daily. Testosterone cream 150mg /15% Place 15% onto the skin daily     [provider]  B Complex Vitamins (VITAMIN B COMPLEX PO) Take 1 tablet by mouth daily.    [provider]  BYSTOLIC 10 MG tablet TAKE 1 TABLET BY MOUTH EVERY DAY 02/28/19   Larey Dresser, MD  cetirizine (ZYRTEC) 10 MG tablet Take 10 mg by mouth daily as needed for allergies.     [provider]  dicyclomine (BENTYL) 10 MG capsule Take 1 capsule (10 mg total) by mouth 3 (three) times daily before meals. Patient taking differently: Take 10 mg by mouth as needed.  01/04/19   Ladene Artist, MD  diltiazem (CARDIZEM CD) 120 MG 24 hr capsule TAKE ONE CAPSULE BY MOUTH ONCE EACH DAY. 03/27/18   Burtis Junes, NP  dofetilide (TIKOSYN) 250 MCG capsule TAKE 1 CAPSULE BY MOUTH EVERY 12 HOURS *NEED OFFICE VISIT* 12/25/18   Larey Dresser, MD  ezetimibe (ZETIA) 10 MG tablet TAKE 1 TABLET BY MOUTH EVERY DAY 02/07/19   Larey Dresser, MD  famotidine (PEPCID) 40 MG tablet Take 1 tablet (40 mg total) by mouth at bedtime. 01/04/19   Ladene Artist, MD  furosemide (LASIX) 40 MG tablet Take 40 mg by mouth as needed.    [provider]  lactobacillus acidophilus & bulgar (LACTINEX) chewable  tablet Chew 2 tablets by mouth daily.     [provider]  loperamide (IMODIUM) 2 MG capsule Take by mouth as needed for diarrhea or loose stools.    [provider]  LORazepam (ATIVAN) 0.5 MG tablet Take 0.5 mg by mouth at bedtime as needed for sleep.     [provider]  methocarbamol (ROBAXIN) 500 MG tablet Take 500 mg by mouth 3 (three) times daily as needed for muscle spasms.     [provider]  potassium chloride SA (K-DUR,KLOR-CON) 20 MEQ tablet Take 20 mEq by mouth daily.    [provider]  Probiotic Product (ALIGN) 4 MG CAPS Take 4 mg by mouth  daily.    [provider]  rosuvastatin (CRESTOR) 40 MG tablet Take 40 mg by mouth every evening.    [provider]  traZODone (DESYREL) 50 MG tablet Take 50 mg by mouth at bedtime.    [provider]  Vitamin D, Ergocalciferol, (DRISDOL) 50000 UNITS CAPS capsule Take 50,000 Units by mouth every 14 (fourteen) days. Take on 1st and 15th    [provider]  XARELTO 15 MG TABS tablet TAKE 1 TABLET EVERY EVENING 03/02/19   Larey Dresser, MD    Family History Family History  Problem Relation Age of Onset   Heart disease Mother    Heart disease Father    Heart Problems Brother        heart transplant   Heart disease Maternal Grandmother    Heart disease Maternal Grandfather    Heart disease Paternal Grandmother    Heart disease Paternal Grandfather    Prostate cancer Paternal Uncle    Cancer Son        thymic cancer.  died at age 53   Colon cancer Neg Hx    Esophageal cancer Neg Hx    Pancreatic cancer Neg Hx    Rectal cancer Neg Hx    Stomach cancer Neg Hx     Social History Social History   Tobacco Use   Smoking status: Former Smoker    Packs/day: 0.50    Years: 5.00    Pack years: 2.50    Types: Cigarettes    Last attempt to quit: 12/27/1966    Years since quitting: 52.2   Smokeless tobacco: Former Systems developer    Quit date: 1950   Substance Use Topics   Alcohol use: No   Drug use: No     Allergies   Morphine and related   Review of Systems Review of Systems  Constitutional: Negative for diaphoresis and fever.  Eyes: Negative for redness.  Respiratory: Positive for shortness of breath. Negative for cough.   Cardiovascular: Positive for chest pain. Negative for palpitations and leg swelling.  Gastrointestinal: Negative for abdominal pain, nausea and vomiting.  Genitourinary: Negative for dysuria.  Musculoskeletal: Negative for back pain and neck pain.  Skin: Negative for rash.  Neurological: Negative for syncope and light-headedness.  Psychiatric/Behavioral: The patient is not nervous/anxious.      Physical Exam Updated Vital Signs BP 140/83 (BP Location: Right Arm)    Pulse 99    Temp 97.7 F (36.5 C) (Oral)    Resp (!) 25    Ht 5\' 6"  (1.676 m)    Wt 53.1 kg    SpO2 97%    BMI 18.88 kg/m   Physical Exam Vitals signs and nursing note reviewed.  Constitutional:      Appearance: He is well-developed. He is not diaphoretic.  HENT:     Head: Normocephalic and atraumatic.     Mouth/Throat:     Mouth: Mucous membranes are not dry.  Eyes:     Conjunctiva/sclera: Conjunctivae normal.  Neck:     Musculoskeletal: Normal range of motion and neck supple. No muscular tenderness.     Vascular: Normal carotid pulses. No carotid bruit or JVD.     Trachea: Trachea normal. No tracheal deviation.  Cardiovascular:     Rate and Rhythm: Normal rate and regular rhythm.     Pulses: No decreased pulses.     Heart sounds: Normal heart sounds, S1 normal and S2 normal. Heart sounds not distant. No murmur.  Pulmonary:  Effort: Pulmonary effort is normal. No respiratory distress.     Breath sounds: Normal breath sounds. No wheezing.  Chest:     Chest wall: No tenderness.  Abdominal:     General: Bowel sounds are normal.     Palpations: Abdomen is soft.     Tenderness: There is no abdominal tenderness. There is  no guarding or rebound.  Skin:    General: Skin is warm and dry.     Coloration: Skin is not pale.  Neurological:     Mental Status: He is alert.      ED Treatments / Results  Labs (all labs ordered are listed, but only abnormal results are displayed) Labs Reviewed  BASIC METABOLIC PANEL - Abnormal; Notable for the following components:      Result Value   Glucose, Bld 123 (*)    Creatinine, Ser 1.49 (*)    GFR calc non Af Amer 44 (*)    GFR calc Af Amer 51 (*)    All other components within normal limits  CBC  TROPONIN I    EKG EKG Interpretation  Date/Time:  Sunday March 25 2019 10:20:02 EDT Ventricular Rate:  98 PR Interval:    QRS Duration: 105 QT Interval:  373 QTC Calculation: 477 R Axis:   76 Text Interpretation:  Sinus rhythm Repol abnrm, severe global ischemia (LM/MVD) ST depressions more prominent compared to Dec 2019 Confirmed by Sherwood Gambler [phone]) on 03/25/2019 10:25:05 AM  ED ECG REPORT   Date: 03/25/2019  Rate: 79  Rhythm: normal sinus rhythm  QRS Axis: normal  Intervals: normal  ST/T Wave abnormalities: nonspecific ST/T changes  Conduction Disutrbances:none  Narrative Interpretation:   Old EKG Reviewed: changes noted, improved lateral ST segment depression  I have personally reviewed the EKG tracing and disagree with the computerized printout as noted.    Radiology Dg Chest Portable 1 View  Result Date: 03/25/2019 CLINICAL DATA:  Chest pain, shortness of breath. EXAM: PORTABLE CHEST 1 VIEW COMPARISON:  Radiographs of October 03, 2017. FINDINGS: Stable cardiomediastinal silhouette. Status post coronary bypass graft. No pneumothorax or pleural effusion is noted. Stable right apical scarring is noted. No acute pulmonary disease is noted. Stable elevated left hemidiaphragm is noted. Bony thorax is unremarkable. IMPRESSION: No acute cardiopulmonary abnormality seen. Electronically Signed   By: Marijo Conception, M.D.   On: 03/25/2019 11:15     Procedures Procedures (including critical care time)  Medications Ordered in ED Medications  nitroGLYCERIN (NITROSTAT) SL tablet 0.4 mg (0.4 mg Sublingual Given 03/25/19 1029)  nitroGLYCERIN (NITROSTAT) 0.4 MG SL tablet (  Not Given 03/25/19 1029)  aspirin chewable tablet 324 mg (324 mg Oral Given 03/25/19 1030)  nitroGLYCERIN (NITROGLYN) 2 % ointment 1 inch (1 inch Topical Given 03/25/19 1158)     Initial Impression / Assessment and Plan / ED Course  I have reviewed the triage vital signs and the nursing notes.  Pertinent labs & imaging results that were available during my care of the patient were reviewed by me and considered in my medical decision making (see chart for details).        Patient seen and examined. ASA ordered.  EKG reviewed with Dr. Regenia Skeeter.  Some new ST segment depression, moreso laterally. No STEMI.  Will contact cardiology. Work-up initiated.  Low concern for PE given presentation and patient is anticoagulated.   Vital signs reviewed and are as follows: BP 140/83 (BP Location: Right Arm)    Pulse 99    Temp  97.7 F (36.5 C) (Oral)    Resp (!) 25    Ht 5\' 6"  (1.676 m)    Wt 53.1 kg    SpO2 97%    BMI 18.88 kg/m   10:39 AM Spoke with Dr. Radford Pax. Cards will consult shortly.   10:43 AM CP nearly resolved after 1 NTG.   11:26 AM 1st trop negative. Will repeat EKG to ensure stability. Awaiting cards reccs.   11:51 AM Repeat EKG largely unchanged, however lateral depressions are improved.   12:41 PM Cardiology to admit.   Final Clinical Impressions(s) / ED Diagnoses   Final diagnoses:  Precordial pain   Admit. Concern for ACS.   ED Discharge Orders    None       Carlisle Cater, Hershal Coria 03/25/19 [place], MD 03/25/19 1540

## 2019-03-25 NOTE — Progress Notes (Signed)
CRITICAL VALUE ALERT  Critical Value:  Trop 0.8  Date & Time Notied:  1900  Provider Notified: Falk-Martin  Orders Received/Actions taken: Continue plan for hep gtt start and cath in the am. Chest pain free.   Saddie Benders RN

## 2019-03-25 NOTE — ED Notes (Signed)
Inez Catalina, patients wife left her contact info [phone]

## 2019-03-25 NOTE — ED Triage Notes (Signed)
Pt arrived with c/o CP; pt states that pain started after waking up at approx 0700; pt states that pain radiates to bilat upper ext. Pt states that pain feels like pressure in chest and that he normally doesn't have pain. Pt states he did have MI in 1989

## 2019-03-25 NOTE — H&P (Addendum)
Cardiology H&P    Patient ID: [name] MRN: [id number], DOB/AGE: 04-01-1936   Admit date: 03/25/2019 Date of Consult: 03/25/2019  Primary Physician: Burnard Bunting, MD Primary Cardiologist: [name], MD/Traci Radford Pax, MD (sleep apnea) Primary Electrophysiologist: Allegra Lai, MD Requesting Provider: Carlisle Cater, PA-C  Patient Profile    [name] is a 83 y.o. male with a history of CAD s/p remote CABG in 3785, chronic diastolic CHF with EF of 88-50% on Echo in 12/2018 persistent atrial fibrillation s/p ablation in 2015 at [facility] and on Tikosyn, 2nd degree type 1 AV block, obstruction sleep apnea on CPAP, COPD, hypertension, hyperlipidemia, and CKD stage III who is being seen today for the evaluation of chest pain at the request of Carlisle Cater, PA-C (Emergency Department).  History of Present Illness    [name] is a 83 year old male with the above history who is followed by [name] for chronic diastolic CHF, Dr. Radford Pax for sleep apnea, and Dr. Curt Bears for persistent atrial fibrillation. His last heart catheterization was in 10/2008 which showed patent SVG to RCA graft and circumflex system. His LIMA was noted to be atretic and there were serial 60% and 80% stenoses in the native LAD. Decision was made to treat the patient medically. Last Myoview in 05/2017 was low risk. Patient underwent successful DCCV for atypical flutter in 11/2018. Patient was last seen by Dr. Curt Bears on 02/19/2019 at which time he was asymptomatic but was in an atypical atrial flutter. He was in the process of being fitted for a mouthpiece for his CPAP machine at the time and it was like he would not benefit much from an atrial flutter ablation until his sleep apnea is better controlled. Patient was continued on Dofetilide, Diltiazem, Bystolic, and Xarelto.  Patient presented to the [facility] ED today for evaluation of chest pain. Patient reports onset of substernal chest pressure this morning around  7:00am after waking up that radiated to bilateral elbows. Pressure has waxed and waned since onset but has been relatively constant. Patient ranks pain as a 7/10 on the pain scale at it worse. He had some associated shortness of breath and lightheadedness but denies any nausea, vomiting, or diaphoresis. Patient denies any other recent episodes of chest pain but states he has chronic shortness of breath with exertion which he feels like has worsened lately. Patient reports some palpitations recently. He denies any orthopnea, PND, or edema. He had a sinus infection about 3 weeks and was treated with antibiotics but denies any recent fevers or illnesses. Patient decided to come to the ED for further evaluation due to the persistent pain.   In the ED, vitals stable. EKG showed possible atypical atrial flutter with signs of possible diffuse ischemia. Initial troponin negative. Chest x-ray showed no acute findings. CBC unremarkable. Na 135, K 3.9, Glucose 123, SCr 1.49. Patient given Aspirin 324mg  and one dose of sublingual Nitro in the ED with resolution of chest pain.  At the time of this evaluation, patient is resting comfortably and denies any significant chest pain but states he thinks the pain may be starting again.   Patient reports remote tobacco use but states he quit over 50 years ago. He denies any alcohol or recreational drug use. Patient does have a family history of heart disease. Both his mother and father had angina and died in their 45's and his brother had a heart transplant at the age of 49.   Past Medical History  Past Medical History:  Diagnosis Date  . Abnormal nuclear cardiac imaging test March 2011   Has positive EKG response, no perfusion defect and normal EF  . Adenomatous colon polyp 12/2002  . Atrial fibrillation ([place])    a. s/p rfca;  b. chronic tikosyn and xarelto.  . 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 Cataract    bil cataracts removed  .  Chronic diastolic CHF (congestive heart failure) ([place])    a. 04/2017 Echo: EF 65-70%, restrictive physiology, mildly dil LA.  [name] COPD (chronic obstructive pulmonary disease) ([place])   . Coronary artery disease    First MI in 1988. PCI in 1996 with subsequent CABG in 1996 due to restenosis. S/P stents to LCX in 2009. Noted to have residual disease in the LAD in a diffuse manner and atretic LIMA graft. He is managed medically.   . Esophageal stricture   . GERD (gastroesophageal reflux disease)   . 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 History of Raynaud's syndrome   . Hyperlipidemia   . Hypertension   . Internal hemorrhoids   . Myocardial infarction [facility])    MI x1 55 - 79 age  . Neuromuscular disorder ([place])    Polio - age 40  . OSA (obstructive sleep apnea) 10/16/2018   Severe OSA with AHI 37.2/hr on BiPAP  . Persistent atrial fibrillation   . Polio    age 19  . PVC (premature ventricular contraction)   . Scoliosis    mild    Past Surgical History:  Procedure Laterality Date  . ABLATION     done for a fib  . APPENDECTOMY     Age 21  . CARDIOVERSION N/A 08/22/2013   Procedure: CARDIOVERSION;  Surgeon: Carlena Bjornstad, MD;  Location[facility] ENDOSCOPY;  Service: Cardiovascular;  Laterality: N/A;  . CARDIOVERSION N/A 01/16/2014   Procedure: CARDIOVERSION;  Surgeon: Lelon Perla, MD;  Location: [facility] ENDOSCOPY;  Service: Cardiovascular;  Laterality: N/A;  . CARDIOVERSION N/A 12/11/2018   Procedure: CARDIOVERSION;  Surgeon: Larey Dresser, MD;  Location: [facility] ENDOSCOPY;  Service: Cardiovascular;  Laterality: N/A;  . COLONOSCOPY  2008  . CORONARY ARTERY BYPASS GRAFT  1996   with a LIMA to the LAD, SVG to RCA and SVG to OM  . CORONARY STENT PLACEMENT  2009    LCX  . POLYPECTOMY    . TONSILLECTOMY    . UPPER GASTROINTESTINAL ENDOSCOPY       Allergies  Allergies  Allergen Reactions  . Morphine And Related Other (See Comments)    IV forms- vein irritation    Inpatient  Medications    . nitroGLYCERIN        Family History    Family History  Problem Relation Age of Onset  . Heart disease Mother   . Heart disease Father   . Heart Problems Brother        heart transplant  . Heart disease Maternal Grandmother   . Heart disease Maternal Grandfather   . Heart disease Paternal Grandmother   . Heart disease Paternal Grandfather   . Prostate cancer Paternal Uncle   . Cancer Son        thymic cancer.  died at age 17  . Colon cancer Neg Hx   . Esophageal cancer Neg Hx   . Pancreatic cancer Neg Hx   . Rectal cancer Neg Hx   . Stomach cancer Neg Hx    He  indicated that his mother is deceased. He indicated that his father is deceased. He indicated that the status of his brother is unknown. He indicated that his maternal grandmother is deceased. He indicated that his maternal grandfather is deceased. He indicated that his paternal grandmother is deceased. He indicated that his paternal grandfather is deceased. He indicated that his son is deceased. He indicated that his paternal uncle is deceased. He indicated that the status of his neg hx is unknown.   Social History    Social History   Socioeconomic History  . Marital status: Married    Spouse name: Not on file  . Number of children: 2  . Years of education: Not on file  . Highest education level: Not on file  Occupational History  . Occupation: Software engineer  Social Needs  . Financial resource strain: Not on file  . Food insecurity:    Worry: Not on file    Inability: Not on file  . Transportation needs:    Medical: Not on file    Non-medical: Not on file  Tobacco Use  . Smoking status: Former Smoker    Packs/day: 0.50    Years: 5.00    Pack years: 2.50    Types: Cigarettes    Last attempt to quit: 12/27/1966    Years since quitting: 52.2  . Smokeless tobacco: Former Systems developer    Quit date: 1950  Substance and Sexual Activity  . Alcohol use: No  . Drug use: No  . Sexual activity: Yes   Lifestyle  . Physical activity:    Days per week: Not on file    Minutes per session: Not on file  . Stress: Not on file  Relationships  . Social connections:    Talks on phone: Not on file    Gets together: Not on file    Attends religious service: Not on file    Active member of club or organization: Not on file    Attends meetings of clubs or organizations: Not on file    Relationship status: Not on file  . Intimate partner violence:    Fear of current or ex partner: Not on file    Emotionally abused: Not on file    Physically abused: Not on file    Forced sexual activity: Not on file  Other Topics Concern  . 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  HENT: Negative for congestion.   Respiratory: Positive for shortness of breath. Negative for cough and hemoptysis.   Cardiovascular: Positive for chest pain and palpitations. Negative for orthopnea, leg swelling and PND.  Gastrointestinal: Negative for blood in stool, nausea and vomiting.  Genitourinary: Negative for hematuria.  Musculoskeletal: Negative for falls and myalgias.  Neurological: Positive for dizziness (mild lightheadedness).  Endo/Heme/Allergies: Does not bruise/bleed easily.  Psychiatric/Behavioral: Negative for substance abuse.    Physical Exam    Blood pressure 140/83, pulse 99, temperature 97.7 F (36.5 C), temperature source Oral, resp. rate (!) 25, height 5\' 6"  (1.676 m), weight 53.1 kg, SpO2 97 %.  General: 83 y.o. male resting comfortably in no acute distress. Pleasant and cooperative. HEENT: Normal.  Neck: Supple. No carotid bruits or JVD appreciated. Lungs: No increased work of breathing. Clear to auscultation bilaterally. No wheezes, rhonchi, or  rales. Heart: RRR. Distinct S1 and S2. No murmurs, gallops, or  rubs.  Abdomen: Soft, non-distended, and non-tender to palpation. Bowel sounds present in all 4 quadrants.   Extremities: No clubbing, cyanosis or edema. Radial and distal pedal pulses 2+ and equal bilaterally. Skin: Warm and dry. Neuro: Alert and oriented x3. No focal deficits. Moves all extremities spontaneously. Psych: Normal affect. Responds appropriately.  Labs    Troponin (Point of Care Test) No results for input(s): TROPIPOC in the last 72 hours. No results for input(s): CKTOTAL, CKMB, TROPONINI in the last 72 hours. Lab Results  Component Value Date   WBC 6.6 03/25/2019   HGB 16.2 03/25/2019   HCT 49.8 03/25/2019   MCV 99.2 03/25/2019   PLT 174 03/25/2019   No results for input(s): NA, K, CL, CO2, BUN, CREATININE, CALCIUM, PROT, BILITOT, ALKPHOS, ALT, AST, GLUCOSE in the last 168 hours.  Invalid input(s): LABALBU Lab Results  Component Value Date   CHOL 124 12/07/2018   HDL 53 12/07/2018   LDLCALC 62 12/07/2018   TRIG 47 12/07/2018   No results found for: [facility]   Radiology Studies    No results found.  EKG     EKG: EKG was personally reviewed and demonstrates: Possible atypical atrial flutter with diffuse ischemia.   Telemetry: Telemetry was personally reviewed and demonstrates: Possible atypical atrial flutter with occasional missed beats (possible Wenckebach).  Cardiac Imaging    Echocardiogram 12/29/2018: Study Conclusions: - Left ventricle: The cavity size was normal. Systolic function was   normal. The estimated ejection fraction was in the range of 55%   to 60%. Hypokinesis of the basalanteroseptal and inferoseptal   myocardium. The study is not technically sufficient to allow   evaluation of LV diastolic function. - Aortic valve: Valve mobility was restricted. Transvalvular   velocity was within the normal range. There was no stenosis.   There was no regurgitation. Mean gradient (S): 8 mm Hg. Valve   area (VTI): 1.28 cm^2. Valve area (Vmax): 1.28  cm^2. Valve area   (Vmean): 1.26 cm^2. - Mitral valve: Transvalvular velocity was within the normal range.   There was no evidence for stenosis. There was mild regurgitation. - Left atrium: The atrium was moderately dilated. - Right ventricle: The cavity size was normal. Wall thickness was   normal. Systolic function was normal. - Tricuspid valve: There was mild regurgitation. - Pulmonary arteries: Systolic pressure was within the normal   range. PA peak pressure: 35 mm Hg (S). _______________  Myoview 06/06/2017:  Nuclear stress EF: 54%.  There was no ST segment deviation noted during stress.  This is a low risk study.  The left ventricular ejection fraction is mildly decreased (45-54%).  No ischemia  Assessment & Plan    Chest Pain with Known CAD - Patient presents with substernal chest pressure that radiates to bilateral upper extremities and resolved with Nitroglycerin in the ED. He also reports worsening dyspnea on exertion lately. - EKG showed consistent with possible atypical atrial flutter and diffuse ischemia. - Most recent Echo in 12/2018 showed LVEF of 55-60% with hypokinesis of the basalanteroseptal and inferoseptal myocardium. - Initial troponin negative. Will trend.  - Patient currently chest pain free.  - Presentation concerning for possible unstable angina. Discussed with MD - will and admit and plan for cardiac catheterization tomorrow. Discussed with patient. Patient would like [name] to perform the heart catheterization and if he cannot, he is unsure whether he wants to proceed at this time. Will see  if [name] is available. Will go ahead and make patient NPO.  Atrial Fibrillation/Flutter - Patient has a history of persistent atrial fibrillation s/p ablation in 2015 at [facility] and more recently atypical atrial flutter. - EKG shows possible atypical atrial flutter.  - Continue home Tikosyn, Diltiazem, Bystolic - Continue chronic anticoagulation with Xarelto.   Chronic Diastolic CHF - LVEF 91-47% on Echo in 12/2018. - Patient does not appear volume overloaded on exam.  - Patient on PO Lasix 40mg  daily at home but states he has not been taking it lately because his weights have been down. Will hold Lasix at this time.  Hypertension - BP currently well controlled at 115/73. - Continue home medications.  Hyperlipidemia - Recent lipid panel in 11/2018: Cholesterol 124, Triglycerides 47, HDL 53, LDL 62.  - Continue home Crestor 40mg  daily and Zetia 10mg  daily.  CKD Stage III - Serum creatinine 1.49 on admission. Baseline appears to be 1.4 to 1.6.  Signed, Darreld Mclean, PA-C 03/25/2019, 11:03 AM  For questions or updates, please contact   Please consult [email or web address] for contact info under Cardiology/STEMI.  The patient was seen, examined and discussed with Sande Rives, PA-C and I agree with the above.   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  The patient was seen by Dr. Curt Bears on February 19, 2019 when the patient was in atypical flutter and was supposed to be evaluated for possible ablation.  The patient developed chest pain after he woke up this morning it felt pressure-like, radiating to both of his arms, he has also noticed several months of progressively worsening dyspnea on exertion.  When he arrived to the ER nitroglycerin significantly improved his pain.  His last heart catheterization was in 10/2008 which showed patent SVG to RCA graft and circumflex system. His LIMA was noted to be atretic and there were serial 60% and 80% stenoses in the native LAD.  Decision was made to treat the patient medically. Last Myoview in 05/2017 was low risk. Patient underwent successful DCCV for atypical flutter in 11/2018. Patient was last seen by Dr. Curt Bears on 02/19/2019 at which time he was asymptomatic but was in an atypical atrial flutter. He was in the process of being fitted for a mouthpiece for his CPAP machine at the time and it was like he would not benefit much from an atrial flutter ablation until his sleep apnea is better controlled. Patient was continued on Dofetilide, Diltiazem, Bystolic, and Xarelto.  On physical exam he does not appear to be in acute distress, he has no JVDs, clear lungs, regular rate and rhythm, warm extremities with no edema and good peripheral pulses. His EKG today is different from EKG on February 24 and suspicious for atrial tachycardia.  His labs show normal troponin, creatinine, electrolytes hemoglobin and platelets.  Assessment and plan:  Chest pain suspicious for unstable angina New atrial tachycardia Chronic atrial flutter  Continue cycling troponin, start heparin iv Plan for a cardiac cath in the am (worsening symptoms, also for dofetilide safety) EP consult for atypical flutter/atrial tachycardia  Ena Dawley, MD 03/25/2019

## 2019-03-25 NOTE — Progress Notes (Signed)
[place] for heparin Indication: chest pain/ACS  Heparin Dosing Weight: 53.1 kg  Labs: Recent Labs    03/25/19 1023  HGB 16.2  HCT 49.8  PLT 174  CREATININE 1.49*  TROPONINI <0.03    Assessment: 68 yom with hx afib on Xarelto PTA presenting with CP concerning for unstable angina. Pharmacy consulted to dose heparin for ACS. CBC wnl, initial troponin negative. No active bleed issues documented. Last dose of Xarelto was 3/28 at 2000.  Goal of Therapy:  Heparin level 0.3-0.7 units/ml aPTT 66-102 seconds Monitor platelets by anticoagulation protocol: Yes   Plan:  Check baseline aPTT/heparin level No bolus with recent Xarelto Start heparin at 850 units/h at 2000 tonight (24hrs after last Xarelto dose per protocol) 8h aPTT Daily heparin level/aPTT/CBC Monitor s/sx bleeding  Elicia Lamp, PharmD, BCPS Clinical Pharmacist 03/25/2019 12:54 PM

## 2019-03-25 NOTE — ED Notes (Signed)
Report called to [phone]

## 2019-03-25 NOTE — Telephone Encounter (Signed)
This encounter was created in error - please disregard.

## 2019-03-25 NOTE — ED Notes (Signed)
ED TO INPATIENT HANDOFF REPORT  ED Nurse Name and Phone #: Vilinda Blanks RN [phone]  S Name/Age/Gender [name] 83 y.o. male Room/Bed: 029C/029C  Code Status   Code Status: Prior  Home/SNF/Other Home Patient oriented to: self, place, time and situation Is this baseline? Yes   Triage Complete: Triage complete  Chief Complaint chest pain  Triage Note Pt arrived with c/o CP; pt states that pain started after waking up at approx 0700; pt states that pain radiates to bilat upper ext. Pt states that pain feels like pressure in chest and that he normally doesn't have pain. Pt states he did have MI in 1989   Allergies Allergies  Allergen Reactions  . Morphine And Related Other (See Comments)    IV forms- vein irritation    Level of Care/Admitting Diagnosis ED Disposition    ED Disposition Condition Comment   Admit  Hospital Area: [place] [[id number]]  Level of Care: Telemetry Cardiac [103]  Diagnosis: Unstable angina [facility]) [[id number]]  Admitting Physician: Dorothy Spark [[id number]]  Attending Physician: Dorothy Spark [[id number]]  Estimated length of stay: past midnight tomorrow  Certification:: I certify this patient will need inpatient services for at least 2 midnights  PT Class (Do Not Modify): Inpatient [101]  PT Acc Code (Do Not Modify): Private [1]       B Medical/Surgery History Past Medical History:  Diagnosis Date  . Abnormal nuclear cardiac imaging test March 2011   Has positive EKG response, no perfusion defect and normal EF  . Adenomatous colon polyp 12/2002  . Atrial fibrillation ([place])    a. s/p rfca;  b. chronic tikosyn and xarelto.  . 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 Cataract    bil cataracts removed  . Chronic diastolic CHF (congestive heart failure) ([place])    a. 04/2017 Echo: EF 65-70%, restrictive physiology, mildly dil LA.  [name] COPD (chronic obstructive pulmonary disease) ([place])   . Coronary  artery disease    First MI in 1988. PCI in 1996 with subsequent CABG in 1996 due to restenosis. S/P stents to LCX in 2009. Noted to have residual disease in the LAD in a diffuse manner and atretic LIMA graft. He is managed medically.   . Esophageal stricture   . GERD (gastroesophageal reflux disease)   . 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 History of Raynaud's syndrome   . Hyperlipidemia   . Hypertension   . Internal hemorrhoids   . Myocardial infarction [facility])    MI x1 76 - 77 age  . Neuromuscular disorder ([place])    Polio - age 63  . OSA (obstructive sleep apnea) 10/16/2018   Severe OSA with AHI 37.2/hr on BiPAP  . Persistent atrial fibrillation   . Polio    age 69  . PVC (premature ventricular contraction)   . Scoliosis    mild   Past Surgical History:  Procedure Laterality Date  . ABLATION     done for a fib  . APPENDECTOMY     Age 8  . CARDIOVERSION N/A 08/22/2013   Procedure: CARDIOVERSION;  Surgeon: Carlena Bjornstad, MD;  Location[facility] ENDOSCOPY;  Service: Cardiovascular;  Laterality: N/A;  . CARDIOVERSION N/A 01/16/2014   Procedure: CARDIOVERSION;  Surgeon: Lelon Perla, MD;  Location: [facility] ENDOSCOPY;  Service: Cardiovascular;  Laterality: N/A;  . CARDIOVERSION N/A 12/11/2018   Procedure: CARDIOVERSION;  Surgeon: Larey Dresser, MD;  Location:  [place] ENDOSCOPY;  Service: Cardiovascular;  Laterality: N/A;  . COLONOSCOPY  2008  . CORONARY ARTERY BYPASS GRAFT  1996   with a LIMA to the LAD, SVG to RCA and SVG to OM  . CORONARY STENT PLACEMENT  2009    LCX  . POLYPECTOMY    . TONSILLECTOMY    . UPPER GASTROINTESTINAL ENDOSCOPY       A IV Location/Drains/Wounds Patient Lines/Drains/Airways Status   Active Line/Drains/Airways    None          Intake/Output Last 24 hours No intake or output data in the 24 hours ending 03/25/19 1252  Labs/Imaging Results for orders placed or performed during the hospital encounter of 03/25/19 (from the past 48 hour(s))   CBC     Status: None   Collection Time: 03/25/19 10:23 AM  Result Value Ref Range   WBC 6.6 4.0 - 10.5 K/uL   RBC 5.02 4.22 - 5.81 MIL/uL   Hemoglobin 16.2 13.0 - 17.0 g/dL   HCT 49.8 39.0 - 52.0 %   MCV 99.2 80.0 - 100.0 fL   MCH 32.3 26.0 - 34.0 pg   MCHC 32.5 30.0 - 36.0 g/dL   RDW 13.3 11.5 - 15.5 %   Platelets 174 150 - 400 K/uL   nRBC 0.0 0.0 - 0.2 %    Comment: Performed at [facility], [place] [address] [place], [place] [address]  Basic metabolic panel     Status: Abnormal   Collection Time: 03/25/19 10:23 AM  Result Value Ref Range   Sodium 135 135 - 145 mmol/L   Potassium 3.9 3.5 - 5.1 mmol/L   Chloride 98 98 - 111 mmol/L   CO2 24 22 - 32 mmol/L   Glucose, Bld 123 (H) 70 - 99 mg/dL   BUN 19 8 - 23 mg/dL   Creatinine, Ser 1.49 (H) 0.61 - 1.24 mg/dL   Calcium 10.1 8.9 - 10.3 mg/dL   GFR calc non Af Amer 44 (L) >60 mL/min   GFR calc Af Amer 51 (L) >60 mL/min   Anion gap 13 5 - 15    Comment: Performed at [facility], [place] [address] [place], [place] [address]  Troponin I - ONCE - STAT     Status: None   Collection Time: 03/25/19 10:23 AM  Result Value Ref Range   Troponin I <0.03 <0.03 ng/mL    Comment: Performed at [facility] [address], [place], [place] [address]   Dg Chest Portable 1 View  Result Date: 03/25/2019 CLINICAL DATA:  Chest pain, shortness of breath. EXAM: PORTABLE CHEST 1 VIEW COMPARISON:  Radiographs of October 03, 2017. FINDINGS: Stable cardiomediastinal silhouette. Status post coronary bypass graft. No pneumothorax or pleural effusion is noted. Stable right apical scarring is noted. No acute pulmonary disease is noted. Stable elevated left hemidiaphragm is noted. Bony thorax is unremarkable. IMPRESSION: No acute cardiopulmonary abnormality seen. Electronically Signed   By: Marijo Conception, M.D.   On: 03/25/2019 11:15    Pending Labs Unresulted Labs (From admission, onward)    Start     Ordered   03/26/19 0500  Hemoglobin  A1c  Tomorrow morning,   R     03/25/19 1243   Signed and Held  Troponin I - Now Then Q6H  Now then every 6 hours,   R     Signed and Held   Signed and Held  Basic metabolic panel  Tomorrow morning,  R     Signed and Held          Vitals/Pain Today's Vitals   03/25/19 1115 03/25/19 1130 03/25/19 1145 03/25/19 1150  BP: 124/73 120/79 115/73   Pulse: (!) 101 (!) 103 73   Resp: 16 (!) 21 (!) 30   Temp:      TempSrc:      SpO2: 100% 97% 99%   Weight:      Height:      PainSc:    2     Isolation Precautions No active isolations  Medications Medications  nitroGLYCERIN (NITROSTAT) SL tablet 0.4 mg (0.4 mg Sublingual Given 03/25/19 1029)  nitroGLYCERIN (NITROSTAT) 0.4 MG SL tablet (  Not Given 03/25/19 1029)  aspirin chewable tablet 324 mg (324 mg Oral Given 03/25/19 1030)  nitroGLYCERIN (NITROGLYN) 2 % ointment 1 inch (1 inch Topical Given 03/25/19 1158)    Mobility walks Low fall risk   Focused Assessments Cardiac Assessment Handoff:  Cardiac Rhythm: Sinus tachycardia Lab Results  Component Value Date   CKTOTAL 249 (H) 03/28/2008   CKMB 13.1 (H) 03/28/2008   TROPONINI <0.03 03/25/2019   No results found for: DDIMER Does the Patient currently have chest pain? No     R Recommendations: See Admitting Provider Note  Report given to:   Additional Notes:  Pt with extensive Cardiac History presenting to ER with central CP radiating bilat arms. Given 1 SL NTG and 324 chew asa. Now CP free. 20 RAC IV access locked off. A/O x 4, pleasant, independent.  Using urinal and call bell appropriately. NPO p MN for possible cath with [name] (pt unsure if he would like to proceed with this, Attending will contact [name] to discuss with Pt). VSS Full Code

## 2019-03-25 NOTE — Progress Notes (Signed)
   Patient admitted for unstable angina. Plan is for left heart catheterization tomorrow with [name] who agreed to perform the procedure. The patient understands that risks include but are not limited to stroke (1 in 1000), death (1 in 22), kidney failure [usually temporary] (1 in 500), bleeding (1 in 200), allergic reaction [possibly serious] (1 in 200), and agrees to proceed.   Darreld Mclean, PA-C 03/25/2019 1:34 PM

## 2019-03-26 ENCOUNTER — Inpatient Hospital Stay (HOSPITAL_COMMUNITY): Payer: Medicare Other

## 2019-03-26 ENCOUNTER — Telehealth: Payer: Self-pay | Admitting: *Deleted

## 2019-03-26 ENCOUNTER — Encounter (HOSPITAL_COMMUNITY)
Admission: EM | Disposition: A | Discharge status: Home / Self Care | Payer: Self-pay | Source: Home / Self Care | Attending: Cardiology

## 2019-03-26 DIAGNOSIS — I361 Nonrheumatic tricuspid (valve) insufficiency: Secondary | ICD-10-CM

## 2019-03-26 DIAGNOSIS — I214 Non-ST elevation (NSTEMI) myocardial infarction: Principal | ICD-10-CM

## 2019-03-26 DIAGNOSIS — I25710 Atherosclerosis of autologous vein coronary artery bypass graft(s) with unstable angina pectoris: Secondary | ICD-10-CM

## 2019-03-26 DIAGNOSIS — I25110 Atherosclerotic heart disease of native coronary artery with unstable angina pectoris: Secondary | ICD-10-CM

## 2019-03-26 HISTORY — PX: LEFT HEART CATH AND CORS/GRAFTS ANGIOGRAPHY: CATH118250

## 2019-03-26 HISTORY — PX: CORONARY STENT INTERVENTION: CATH118234

## 2019-03-26 LAB — HEMOGLOBIN A1C
Hgb A1c MFr Bld: 6.2 % — ABNORMAL HIGH (ref 4.8–5.6)
Mean Plasma Glucose: 131.24 mg/dL

## 2019-03-26 LAB — BASIC METABOLIC PANEL
Anion gap: 7 (ref 5–15)
BUN: 18 mg/dL (ref 8–23)
CO2: 26 mmol/L (ref 22–32)
Calcium: 9.3 mg/dL (ref 8.9–10.3)
Chloride: 103 mmol/L (ref 98–111)
Creatinine, Ser: 1.29 mg/dL — ABNORMAL HIGH (ref 0.61–1.24)
GFR calc Af Amer: >60 mL/min (ref >60)
GFR calc non Af Amer: 52 mL/min — ABNORMAL LOW (ref >60)
Glucose, Bld: 103 mg/dL — ABNORMAL HIGH (ref 70–99)
Potassium: 4 mmol/L (ref 3.5–5.1)
Sodium: 136 mmol/L (ref 135–145)

## 2019-03-26 LAB — CBC
HCT: 42.3 % (ref 39.0–52.0)
HEMOGLOBIN: 13.7 g/dL (ref 13.0–17.0)
MCH: 31.4 pg (ref 26.0–34.0)
MCHC: 32.4 g/dL (ref 30.0–36.0)
MCV: 97 fL (ref 80.0–100.0)
Platelets: 132 10*3/uL — ABNORMAL LOW (ref 150–400)
RBC: 4.36 MIL/uL (ref 4.22–5.81)
RDW: 13.1 % (ref 11.5–15.5)
WBC: 5.8 10*3/uL (ref 4.0–10.5)
nRBC: 0 % (ref 0.0–0.2)

## 2019-03-26 LAB — ECHOCARDIOGRAM COMPLETE
Height: 66 in
WEIGHTICAEL: 1905.6 [oz_av]

## 2019-03-26 LAB — APTT: aPTT: 106 seconds — ABNORMAL HIGH (ref 24–36)

## 2019-03-26 LAB — POCT ACTIVATED CLOTTING TIME
Activated Clotting Time: 312 seconds
Activated Clotting Time: 428 seconds

## 2019-03-26 LAB — TROPONIN I: Troponin I: 0.91 ng/mL (ref <0.03)

## 2019-03-26 LAB — HEPARIN LEVEL (UNFRACTIONATED): Heparin Unfractionated: 0.86 IU/mL — ABNORMAL HIGH (ref 0.30–0.70)

## 2019-03-26 SURGERY — LEFT HEART CATH AND CORS/GRAFTS ANGIOGRAPHY
Anesthesia: LOCAL

## 2019-03-26 MED ORDER — VERAPAMIL HCL 2.5 MG/ML IV SOLN
INTRAVENOUS | Status: DC | PRN
  Administered 2019-03-26: 10 mL via INTRA_ARTERIAL

## 2019-03-26 MED ORDER — LABETALOL HCL 5 MG/ML IV SOLN: 10.0000 mg | INTRAVENOUS | Status: AC | PRN

## 2019-03-26 MED ORDER — NITROGLYCERIN 1 MG/10 ML FOR IR/CATH LAB
INTRA_ARTERIAL | Status: AC
  Filled 2019-03-26: qty 10

## 2019-03-26 MED ORDER — HEPARIN SODIUM (PORCINE) 1000 UNIT/ML IJ SOLN
INTRAMUSCULAR | Status: AC
  Filled 2019-03-26: qty 1

## 2019-03-26 MED ORDER — CLOPIDOGREL BISULFATE 75 MG PO TABS
75.0000 mg | ORAL_TABLET | Freq: Every day | ORAL | Status: DC
  Administered 2019-03-27: 75 mg via ORAL
  Filled 2019-03-26: qty 1

## 2019-03-26 MED ORDER — LIDOCAINE HCL (PF) 1 % IJ SOLN
INTRAMUSCULAR | Status: AC
  Filled 2019-03-26: qty 30

## 2019-03-26 MED ORDER — ONDANSETRON HCL 4 MG/2ML IJ SOLN: 4.0000 mg | Freq: Four times a day (QID) | INTRAMUSCULAR | Status: DC | PRN

## 2019-03-26 MED ORDER — HEPARIN SODIUM (PORCINE) 1000 UNIT/ML IJ SOLN
INTRAMUSCULAR | Status: DC | PRN
  Administered 2019-03-26: 3000 [IU] via INTRAVENOUS
  Administered 2019-03-26: 5000 [IU] via INTRAVENOUS

## 2019-03-26 MED ORDER — CLOPIDOGREL BISULFATE 300 MG PO TABS
ORAL_TABLET | ORAL | Status: AC
  Filled 2019-03-26: qty 2

## 2019-03-26 MED ORDER — IOHEXOL 350 MG/ML SOLN
INTRAVENOUS | Status: DC | PRN
  Administered 2019-03-26: 65 mL via INTRAVENOUS

## 2019-03-26 MED ORDER — ASPIRIN 81 MG PO CHEW: 81.0000 mg | CHEWABLE_TABLET | Freq: Every day | ORAL | Status: DC

## 2019-03-26 MED ORDER — HEPARIN SODIUM (PORCINE) 1000 UNIT/ML IJ SOLN
INTRAMUSCULAR | Status: DC | PRN
  Administered 2019-03-26: 3000 [IU] via INTRAVENOUS

## 2019-03-26 MED ORDER — ADENOSINE 6 MG/2ML IV SOLN
INTRAVENOUS | Status: AC
  Filled 2019-03-26: qty 2

## 2019-03-26 MED ORDER — ADENOSINE (DIAGNOSTIC) FOR INTRACORONARY USE
INTRAVENOUS | Status: DC | PRN
  Administered 2019-03-26: 60 ug via INTRACORONARY

## 2019-03-26 MED ORDER — LIDOCAINE HCL (PF) 1 % IJ SOLN
INTRAMUSCULAR | Status: DC | PRN
  Administered 2019-03-26: 2 mL

## 2019-03-26 MED ORDER — HYDRALAZINE HCL 20 MG/ML IJ SOLN: 10.0000 mg | INTRAMUSCULAR | Status: AC | PRN

## 2019-03-26 MED ORDER — MIDAZOLAM HCL 2 MG/2ML IJ SOLN
INTRAMUSCULAR | Status: AC
  Filled 2019-03-26: qty 2

## 2019-03-26 MED ORDER — CLOPIDOGREL BISULFATE 300 MG PO TABS
ORAL_TABLET | ORAL | Status: DC | PRN
  Administered 2019-03-26: 600 mg via ORAL

## 2019-03-26 MED ORDER — SODIUM CHLORIDE 0.9 % IV SOLN: 250.0000 mL | INTRAVENOUS | Status: DC | PRN

## 2019-03-26 MED ORDER — HEPARIN (PORCINE) IN NACL 1000-0.9 UT/500ML-% IV SOLN
INTRAVENOUS | Status: AC
  Filled 2019-03-26: qty 500

## 2019-03-26 MED ORDER — SODIUM CHLORIDE 0.9% FLUSH
3.0000 mL | Freq: Two times a day (BID) | INTRAVENOUS | Status: DC
  Administered 2019-03-27: 3 mL via INTRAVENOUS

## 2019-03-26 MED ORDER — ACETAMINOPHEN 325 MG PO TABS: 650.0000 mg | ORAL_TABLET | ORAL | Status: DC | PRN

## 2019-03-26 MED ORDER — HEPARIN (PORCINE) IN NACL 1000-0.9 UT/500ML-% IV SOLN
INTRAVENOUS | Status: DC | PRN
  Administered 2019-03-26 (×3): 500 mL

## 2019-03-26 MED ORDER — SODIUM CHLORIDE 0.9% FLUSH: 3.0000 mL | INTRAVENOUS | Status: DC | PRN

## 2019-03-26 MED ORDER — VERAPAMIL HCL 2.5 MG/ML IV SOLN
INTRAVENOUS | Status: AC
  Filled 2019-03-26: qty 2

## 2019-03-26 MED ORDER — FENTANYL CITRATE (PF) 100 MCG/2ML IJ SOLN
INTRAMUSCULAR | Status: DC | PRN
  Administered 2019-03-26 (×2): 25 ug via INTRAVENOUS

## 2019-03-26 MED ORDER — SODIUM CHLORIDE 0.9 % IV SOLN: INTRAVENOUS | Status: AC

## 2019-03-26 MED ORDER — FENTANYL CITRATE (PF) 100 MCG/2ML IJ SOLN
INTRAMUSCULAR | Status: DC | PRN
  Administered 2019-03-26: 25 ug via INTRAVENOUS

## 2019-03-26 MED ORDER — HEPARIN (PORCINE) 25000 UT/250ML-% IV SOLN
750.0000 [IU]/h | INTRAVENOUS | Status: DC
  Administered 2019-03-26: 750 [IU]/h via INTRAVENOUS

## 2019-03-26 MED ORDER — MIDAZOLAM HCL 2 MG/2ML IJ SOLN
INTRAMUSCULAR | Status: DC | PRN
  Administered 2019-03-26: 1 mg via INTRAVENOUS

## 2019-03-26 MED ORDER — FENTANYL CITRATE (PF) 100 MCG/2ML IJ SOLN
INTRAMUSCULAR | Status: AC
  Filled 2019-03-26: qty 2

## 2019-03-26 MED ORDER — IOHEXOL 350 MG/ML SOLN
INTRAVENOUS | Status: DC | PRN
  Administered 2019-03-26: 100 mL via INTRACARDIAC

## 2019-03-26 MED ORDER — MIDAZOLAM HCL 2 MG/2ML IJ SOLN
INTRAMUSCULAR | Status: DC | PRN
  Administered 2019-03-26: 0.5 mg via INTRAVENOUS
  Administered 2019-03-26: 1 mg via INTRAVENOUS
  Administered 2019-03-26: 0.5 mg via INTRAVENOUS

## 2019-03-26 MED ORDER — HEPARIN (PORCINE) 25000 UT/250ML-% IV SOLN
750.0000 [IU]/h | INTRAVENOUS | Status: DC
  Administered 2019-03-26: 750 [IU]/h via INTRAVENOUS
  Filled 2019-03-26: qty 250

## 2019-03-26 SURGICAL SUPPLY — 22 items
BALLN SAPPHIRE 2.5X15 (BALLOONS) ×2
BALLN SAPPHIRE ~~LOC~~ 3.0X15 (BALLOONS) ×2 IMPLANT
BALLN SAPPHIRE ~~LOC~~ 3.25X8 (BALLOONS) ×2 IMPLANT
BALLOON SAPPHIRE 2.5X15 (BALLOONS) ×1 IMPLANT
CATH 5FR JL3.5 JR4 ANG PIG MP (CATHETERS) ×2 IMPLANT
CATH EXPO 5F MPA-1 (CATHETERS) ×2 IMPLANT
CATH LAUNCHER 6FR EBU 3 (CATHETERS) ×2 IMPLANT
CATHETER LAUNCHER 6FR MP1 (CATHETERS) ×2 IMPLANT
DEVICE RAD COMP TR BAND LRG (VASCULAR PRODUCTS) ×2 IMPLANT
GLIDESHEATH SLEND SS 6F .021 (SHEATH) ×2 IMPLANT
GUIDEWIRE INQWIRE 1.5J.035X260 (WIRE) ×1 IMPLANT
INQWIRE 1.5J .035X260CM (WIRE) ×2
KIT ENCORE 26 ADVANTAGE (KITS) ×2 IMPLANT
KIT HEART LEFT (KITS) ×2 IMPLANT
KIT HEMO VALVE WATCHDOG (MISCELLANEOUS) ×2 IMPLANT
PACK CARDIAC CATHETERIZATION (CUSTOM PROCEDURE TRAY) ×2 IMPLANT
STENT SYNERGY DES 2.75X38 (Permanent Stent) ×2 IMPLANT
STENT SYNERGY DES 2.75X8 (Permanent Stent) ×2 IMPLANT
STENT SYNERGY DES 3X16 (Permanent Stent) ×2 IMPLANT
TRANSDUCER W/STOPCOCK (MISCELLANEOUS) ×2 IMPLANT
TUBING CIL FLEX 10 FLL-RA (TUBING) ×2 IMPLANT
WIRE ASAHI PROWATER 180CM (WIRE) ×2 IMPLANT

## 2019-03-26 NOTE — Telephone Encounter (Signed)
Patient currently admitted in hospital will call at later date.

## 2019-03-26 NOTE — Progress Notes (Signed)
Patient ID: WOJCIECH WILLETTS, male   DOB: 04/08/1936, 83 y.o.   MRN: [id number]     Advanced Heart Failure Rounding Note  PCP-Cardiologist: No primary care provider on file.   Subjective:    Mild chest pain last night, resolved with NTG.  TnI to 0.91.    Objective:   Weight Range: 54 kg Body mass index is 19.22 kg/m.   Vital Signs:   Temp:  [97.7 F (36.5 C)-98.5 F (36.9 C)] 98.2 F (36.8 C) (03/30 0609) Pulse Rate:  [73-103] 98 (03/30 0609) Resp:  [16-30] 19 (03/29 1300) BP: (110-148)/(64-89) 133/64 (03/30 0609) SpO2:  [89 %-100 %] 99 % (03/30 0806) Weight:  [53.1 kg-54 kg] 54 kg (03/30 0609) Last BM Date: 03/25/19  Weight change: Filed Weights   03/25/19 1033 03/25/19 1500 03/26/19 0609  Weight: 53.1 kg 53.7 kg 54 kg    Intake/Output:   Intake/Output Summary (Last 24 hours) at 03/26/2019 0905 Last data filed at 03/26/2019 0300 Gross per 24 hour  Intake 535.61 ml  Output -  Net 535.61 ml      Physical Exam    General:  Well appearing. No resp difficulty HEENT: Normal Neck: Supple. JVP not elevated. Carotids 2+ bilat; no bruits. No lymphadenopathy or thyromegaly appreciated. Cor: PMI nondisplaced. Regular rate & rhythm. No rubs, gallops or murmurs. Lungs: Clear Abdomen: Soft, nontender, nondistended. No hepatosplenomegaly. No bruits or masses. Good bowel sounds. Extremities: No cyanosis, clubbing, rash, edema Neuro: Alert & orientedx3, cranial nerves grossly intact. moves all 4 extremities w/o difficulty. Affect pleasant   Telemetry   Atypical atrial flutter in 90s (personally reviewed)  Labs    CBC Recent Labs    03/25/19 1023 03/26/19 0348  WBC 6.6 5.8  HGB 16.2 13.7  HCT 49.8 42.3  MCV 99.2 97.0  PLT 174 341*   Basic Metabolic Panel Recent Labs    03/25/19 1023 03/26/19 0348  NA 135 136  K 3.9 4.0  CL 98 103  CO2 24 26  GLUCOSE 123* 103*  BUN 19 18  CREATININE 1.49* 1.29*  CALCIUM 10.1 9.3   Liver Function Tests No results for  input(s): AST, ALT, ALKPHOS, BILITOT, PROT, ALBUMIN in the last 72 hours. No results for input(s): LIPASE, AMYLASE in the last 72 hours. Cardiac Enzymes Recent Labs    03/25/19 1023 03/25/19 1816 03/26/19 0052  TROPONINI <0.03 0.80* 0.91*    BNP: BNP (last 3 results) No results for input(s): BNP in the last 8760 hours.  ProBNP (last 3 results) No results for input(s): PROBNP in the last 8760 hours.   D-Dimer No results for input(s): DDIMER in the last 72 hours. Hemoglobin A1C Recent Labs    03/26/19 0348  HGBA1C 6.2*   Fasting Lipid Panel No results for input(s): CHOL, HDL, LDLCALC, TRIG, CHOLHDL, LDLDIRECT in the last 72 hours. Thyroid Function Tests No results for input(s): TSH, T4TOTAL, T3FREE, THYROIDAB in the last 72 hours.  Invalid input(s): FREET3  Other results:   Imaging    Dg Chest Portable 1 View  Result Date: 03/25/2019 CLINICAL DATA:  Chest pain, shortness of breath. EXAM: PORTABLE CHEST 1 VIEW COMPARISON:  Radiographs of October 03, 2017. FINDINGS: Stable cardiomediastinal silhouette. Status post coronary bypass graft. No pneumothorax or pleural effusion is noted. Stable right apical scarring is noted. No acute pulmonary disease is noted. Stable elevated left hemidiaphragm is noted. Bony thorax is unremarkable. IMPRESSION: No acute cardiopulmonary abnormality seen. Electronically Signed   By: Marijo Conception, M.D.  On: 03/25/2019 11:15      Medications:     Scheduled Medications: . [MAR Hold] aspirin EC  81 mg Oral Daily  . [MAR Hold] diltiazem  120 mg Oral Daily  . [MAR Hold] dofetilide  250 mcg Oral BID  . [MAR Hold] ezetimibe  10 mg Oral Daily  . [MAR Hold] nebivolol  10 mg Oral Daily  . [MAR Hold] rosuvastatin  40 mg Oral QPM  . sodium chloride flush  3 mL Intravenous Q12H  . [MAR Hold] traZODone  50 mg Oral QHS     Infusions: . sodium chloride    . sodium chloride 10 mL/hr at 03/26/19 0520  . heparin Stopped (03/26/19 0729)      PRN Medications:  sodium chloride, [MAR Hold] acetaminophen, [MAR Hold] albuterol, clopidogrel, fentaNYL, Heparin (Porcine) in NaCl, heparin, iohexol, lidocaine (PF), [MAR Hold] LORazepam, [MAR Hold] methocarbamol, midazolam, [MAR Hold] ondansetron (ZOFRAN) IV, Radial Cocktail/Verapamil only, sodium chloride flush   Assessment/Plan   1. CAD: NSTEMI.  Last intervention in 2009 with PCI to native LCx in setting of occluded SVG-LCx system.  Today's cath showed 80-90% stenosis mid SVG-RCA and 95% proximal LAD stenosis with 60-70% mid LAD stenosis at take-off of large septal perforator (which had ostial 60% stenosis). Plan discussed with Dr. Irish Lack, intervene on LAD today +/- SVG-RCA (versus staged).  - Post-procedure, he will need ASA 81 x 1 month and Plavix 75 mg daily long-term.  - He will need to restart Xarelto.  - Continue Crestor/Zetia, check lipids in am.  2. Atrial flutter: Atypical.  He is in flutter today, remains on Tikosyn.  Plan per Dr. Curt Bears for flutter ablation once his OSA has been treated.  He has been using his new oral appliance for about a week now.  - Will restart Xarelto after procedure.  - Continue nebivolol and diltiazem CD.  - Will need to discuss timing of flutter ablation with Dr. Curt Bears.  3. Chronic diastolic CHF: Last echo in 1/20 with EF 55-60%.  Not volume overloaded.  - Repeat echo with NSTEMI.  4. OSA: Severe, see above.  Now with oral appliance.  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   Length of Stay: 1  [name], MD  03/26/2019, 9:05 AM  Advanced Heart Failure Team Pager [phone] (M-F; 7a - 4p)  Please contact [facility] for night-coverage after hours (4p -7a ) and weekends on [email or web address]

## 2019-03-26 NOTE — Plan of Care (Signed)
  Problem: Clinical Measurements: Goal: Will remain free from infection Outcome: Progressing Note:  No s/s of infection noted. Goal: Cardiovascular complication will be avoided Outcome: Progressing Note:  No s/s of cardiovascular complication noted.  A-fib on telemetry, VSS.   Problem: Pain Managment: Goal: General experience of comfort will improve Outcome: Progressing Note:  No s/s of pain or discomfort noted.   Problem: Cardiovascular: Goal: Vascular access site(s) Level 0-1 will be maintained Outcome: Progressing Note:  Right radial site with bruising, Level 1.

## 2019-03-26 NOTE — Progress Notes (Signed)
  Echocardiogram 2D Echocardiogram has been performed.  [name] 03/26/2019, 2:15 PM

## 2019-03-26 NOTE — Progress Notes (Signed)
[place] for heparin Indication: chest pain/ACS  Heparin Dosing Weight: 53.1 kg  Labs: Recent Labs    03/25/19 1023 03/25/19 1322 03/25/19 1816 03/26/19 0052 03/26/19 0348  HGB 16.2  --   --   --  13.7  HCT 49.8  --   --   --  42.3  PLT 174  --   --   --  132*  APTT  --  39*  --   --  106*  HEPARINUNFRC  --  >2.20*  --   --  0.86*  CREATININE 1.49*  --   --   --  1.29*  TROPONINI <0.03  --  0.80* 0.91*  --     Assessment: 82 yom with hx afib on Xarelto PTA (Last dose of was 3/28 at 2000) with CP  unstable angina. Pharmacy consulted to dose heparin for ACS. He is now w/p cath with DES and heparin to restarts 8 hours post sheath removal (removed ~ 10am, TR band placed). Plans to restart Xarelto on 3/31  Goal of Therapy:  Heparin level 0.3-0.7 units/ml aPTT 66-102 seconds Monitor platelets by anticoagulation protocol: Yes   Plan:  -Restart heparin at 6pm at 750 units/hr -Daily heparin level, CBC and aPTT  Hildred Laser, PharmD Clinical Pharmacist **Pharmacist phone directory can now be found on [email or web address] ([email or web address]).  Listed under [place].

## 2019-03-26 NOTE — Progress Notes (Signed)
[place] for Heparin Indication: chest pain/ACS  Heparin Dosing Weight: 53.1 kg  Labs: Recent Labs    03/25/19 1023 03/25/19 1322 03/25/19 1816 03/26/19 0052 03/26/19 0348  HGB 16.2  --   --   --  13.7  HCT 49.8  --   --   --  42.3  PLT 174  --   --   --  132*  APTT  --  39*  --   --  106*  HEPARINUNFRC  --  >2.20*  --   --  0.86*  CREATININE 1.49*  --   --   --  1.29*  TROPONINI <0.03  --  0.80* 0.91*  --     Assessment: 31 yom with hx afib on Xarelto PTA presenting with CP concerning for unstable angina. Pharmacy consulted to dose heparin for ACS. CBC wnl, initial troponin negative. No active bleed issues documented. Last dose of Xarelto was 3/28 at 2000.  3/30 AM update: aPTT just above goal, using aPTT for now due to Xarelto influence on heparin levels  Goal of Therapy:  Heparin level 0.3-0.7 units/ml aPTT 66-102 seconds Monitor platelets by anticoagulation protocol: Yes   Plan:  Dec heparin to 750 units/hr Re-check aPTT/HL in 8 hours Daily heparin level/aPTT/CBC Monitor s/sx bleeding  Narda Bonds, PharmD, BCPS Clinical Pharmacist Phone: [phone]

## 2019-03-26 NOTE — Interval H&P Note (Signed)
History and Physical Interval Note:  03/26/2019 8:12 AM  [name]  has presented today for surgery, with the diagnosis of NSTEMI.  The various methods of treatment have been discussed with the patient and family. After consideration of risks, benefits and other options for treatment, the patient has consented to  Procedure(s): LEFT HEART CATH AND CORS/GRAFTS ANGIOGRAPHY (N/A) as a surgical intervention.  The patient's history has been reviewed, patient examined, no change in status, stable for surgery.  I have reviewed the patient's chart and labs.  Questions were answered to the patient's satisfaction.     Josedejesus Marcum [employer]

## 2019-03-27 ENCOUNTER — Telehealth (HOSPITAL_COMMUNITY): Payer: Self-pay

## 2019-03-27 ENCOUNTER — Encounter (HOSPITAL_COMMUNITY): Payer: Self-pay | Admitting: Cardiology

## 2019-03-27 LAB — CBC WITH DIFFERENTIAL/PLATELET
Abs Immature Granulocytes: 0.02 10*3/uL (ref 0.00–0.07)
BASOS PCT: 1 %
Basophils Absolute: 0 10*3/uL (ref 0.0–0.1)
EOS ABS: 0.2 10*3/uL (ref 0.0–0.5)
Eosinophils Relative: 4 %
HCT: 41.5 % (ref 39.0–52.0)
Hemoglobin: 13.8 g/dL (ref 13.0–17.0)
Immature Granulocytes: 0 %
Lymphocytes Relative: 21 %
Lymphs Abs: 1.3 10*3/uL (ref 0.7–4.0)
MCH: 32.8 pg (ref 26.0–34.0)
MCHC: 33.3 g/dL (ref 30.0–36.0)
MCV: 98.6 fL (ref 80.0–100.0)
Monocytes Absolute: 0.8 10*3/uL (ref 0.1–1.0)
Monocytes Relative: 13 %
Neutro Abs: 3.8 10*3/uL (ref 1.7–7.7)
Neutrophils Relative %: 61 %
Platelets: 123 10*3/uL — ABNORMAL LOW (ref 150–400)
RBC: 4.21 MIL/uL — ABNORMAL LOW (ref 4.22–5.81)
RDW: 13.3 % (ref 11.5–15.5)
WBC: 6.1 10*3/uL (ref 4.0–10.5)
nRBC: 0 % (ref 0.0–0.2)

## 2019-03-27 LAB — BASIC METABOLIC PANEL
Anion gap: 7 (ref 5–15)
BUN: 17 mg/dL (ref 8–23)
CALCIUM: 9.2 mg/dL (ref 8.9–10.3)
CO2: 24 mmol/L (ref 22–32)
CREATININE: 1.29 mg/dL — AB (ref 0.61–1.24)
Chloride: 105 mmol/L (ref 98–111)
GFR calc Af Amer: >60 mL/min (ref >60)
GFR calc non Af Amer: 52 mL/min — ABNORMAL LOW (ref >60)
Glucose, Bld: 83 mg/dL (ref 70–99)
Potassium: 4.2 mmol/L (ref 3.5–5.1)
SODIUM: 136 mmol/L (ref 135–145)

## 2019-03-27 LAB — APTT: aPTT: 106 seconds — ABNORMAL HIGH (ref 24–36)

## 2019-03-27 LAB — LIPID PANEL
Cholesterol: 111 mg/dL (ref 0–200)
HDL: 47 mg/dL (ref >40)
LDL Cholesterol: 53 mg/dL (ref 0–99)
Total CHOL/HDL Ratio: 2.4 RATIO
Triglycerides: 53 mg/dL (ref <150)
VLDL: 11 mg/dL (ref 0–40)

## 2019-03-27 LAB — HEPARIN LEVEL (UNFRACTIONATED): Heparin Unfractionated: 0.5 IU/mL (ref 0.30–0.70)

## 2019-03-27 MED ORDER — NITROGLYCERIN 0.3 MG SL SUBL: 0.3000 mg | SUBLINGUAL_TABLET | SUBLINGUAL | 0 refills | Status: DC | PRN

## 2019-03-27 MED ORDER — ASPIRIN 81 MG PO TBEC: 81.0000 mg | DELAYED_RELEASE_TABLET | Freq: Every day | ORAL | 12 refills | Status: DC

## 2019-03-27 MED ORDER — NITROGLYCERIN 0.4 MG SL SUBL: 0.4000 mg | SUBLINGUAL_TABLET | SUBLINGUAL | 0 refills | Status: DC | PRN

## 2019-03-27 MED ORDER — CLOPIDOGREL BISULFATE 75 MG PO TABS: 75.0000 mg | ORAL_TABLET | Freq: Every day | ORAL | 6 refills | Status: DC

## 2019-03-27 MED ORDER — RIVAROXABAN 15 MG PO TABS
15.0000 mg | ORAL_TABLET | Freq: Every day | ORAL | Status: DC
  Administered 2019-03-27: 15 mg via ORAL
  Filled 2019-03-27: qty 1

## 2019-03-27 MED ORDER — LISINOPRIL 2.5 MG PO TABS: 2.5000 mg | ORAL_TABLET | Freq: Every day | ORAL | Status: DC

## 2019-03-27 MED ORDER — LISINOPRIL 2.5 MG PO TABS
2.5000 mg | ORAL_TABLET | Freq: Every day | ORAL | Status: DC
  Filled 2019-03-27: qty 1

## 2019-03-27 NOTE — Discharge Instructions (Signed)

## 2019-03-27 NOTE — Progress Notes (Signed)
Patient ID: [name], male   DOB: 15-May-1936, 83 y.o.   MRN: [id number]     Advanced Heart Failure Rounding Note  PCP-Cardiologist: No primary care provider on file.   Subjective:    No complaints this morning, no chest pain.   Echo with EF 45-50%, inferior and inferolateral hypokineiss, RV normal.   LHC: 95% pLAD stenosis, TO RCA, TO SVG-LCx, patent LCx stents, 80% stenosis mid SVG-RCA.  DES x 1 to LAD and DES x 2 to SVG-RCA.   Objective:   Weight Range: 54 kg Body mass index is 19.22 kg/m.   Vital Signs:   Temp:  [97.7 F (36.5 C)-98.4 F (36.9 C)] 98.4 F (36.9 C) (03/31 0525) Pulse Rate:  [54-107] 90 (03/31 0525) Resp:  [10-54] 14 (03/31 0525) BP: (96-141)/(51-93) 127/63 (03/31 0525) SpO2:  [94 %-100 %] 94 % (03/31 0525) Weight:  [54 kg] 54 kg (03/31 0525) Last BM Date: 03/25/19  Weight change: Filed Weights   03/25/19 1500 03/26/19 0609 03/27/19 0525  Weight: 53.7 kg 54 kg 54 kg    Intake/Output:   Intake/Output Summary (Last 24 hours) at 03/27/2019 0743 Last data filed at 03/27/2019 0539 Gross per 24 hour  Intake 1407.95 ml  Output 375 ml  Net 1032.95 ml      Physical Exam    General: NAD Neck: JVP 8 cm, no thyromegaly or thyroid nodule.  Lungs: Clear to auscultation bilaterally with normal respiratory effort. CV: Nondisplaced PMI.  Heart regular S1/S2, no S3/S4, no murmur.  No peripheral edema.  Abdomen: Soft, nontender, no hepatosplenomegaly, no distention.  Skin: Intact without lesions or rashes.  Neurologic: Alert and oriented x 3.  Psych: Normal affect. Extremities: No clubbing or cyanosis. Right radial cath site benign.  HEENT: Normal.    Telemetry   Atypical atrial flutter in 90s, but had period of NSR in 70s overnight (personally reviewed)  Labs    CBC Recent Labs    03/26/19 0348 03/27/19 0333  WBC 5.8 6.1  NEUTROABS  --  3.8  HGB 13.7 13.8  HCT 42.3 41.5  MCV 97.0 98.6  PLT 132* 284*   Basic Metabolic Panel Recent  Labs    03/26/19 0348 03/27/19 0333  NA 136 136  K 4.0 4.2  CL 103 105  CO2 26 24  GLUCOSE 103* 83  BUN 18 17  CREATININE 1.29* 1.29*  CALCIUM 9.3 9.2   Liver Function Tests No results for input(s): AST, ALT, ALKPHOS, BILITOT, PROT, ALBUMIN in the last 72 hours. No results for input(s): LIPASE, AMYLASE in the last 72 hours. Cardiac Enzymes Recent Labs    03/25/19 1023 03/25/19 1816 03/26/19 0052  TROPONINI <0.03 0.80* 0.91*    BNP: BNP (last 3 results) No results for input(s): BNP in the last 8760 hours.  ProBNP (last 3 results) No results for input(s): PROBNP in the last 8760 hours.   D-Dimer No results for input(s): DDIMER in the last 72 hours. Hemoglobin A1C Recent Labs    03/26/19 0348  HGBA1C 6.2*   Fasting Lipid Panel Recent Labs    03/27/19 0333  CHOL 111  HDL 47  LDLCALC 53  TRIG 53  CHOLHDL 2.4   Thyroid Function Tests No results for input(s): TSH, T4TOTAL, T3FREE, THYROIDAB in the last 72 hours.  Invalid input(s): FREET3  Other results:   Imaging    No results found.   Medications:     Scheduled Medications: . aspirin EC  81 mg Oral Daily  .  clopidogrel  75 mg Oral Q breakfast  . diltiazem  120 mg Oral Daily  . dofetilide  250 mcg Oral BID  . ezetimibe  10 mg Oral Daily  . nebivolol  10 mg Oral Daily  . rosuvastatin  40 mg Oral QPM  . sodium chloride flush  3 mL Intravenous Q12H  . traZODone  50 mg Oral QHS    Infusions: . sodium chloride    . heparin 750 Units/hr (03/26/19 1824)    PRN Medications: sodium chloride, acetaminophen, albuterol, LORazepam, methocarbamol, ondansetron (ZOFRAN) IV, sodium chloride flush   Assessment/Plan   1. CAD: NSTEMI.  Last intervention in 2009 with PCI to native LCx in setting of occluded SVG-LCx system.  3/30 cath showed 80-90% stenosis mid SVG-RCA and 95% proximal LAD stenosis with 60-70% mid LAD stenosis at take-off of large septal perforator (which had ostial 60% stenosis). Patient  is now s/p DES to prox LAD and DES x 2 to mid RCA. - He will need ASA 81 x 1 month and Plavix 75 mg daily for at least 6 months, ideally 1 year.  - Restart Xarelto 15 mg daily and stop heparin gtt.   - Continue Crestor/Zetia, good LDL today.  2. Atrial flutter: Atypical.  He is in flutter today, remains on Tikosyn. Noted to have periods of NSR on telemetry. He has severe OSA and now has a dental appliance.  Will wear appliance and have outpatient followup with Dr. Curt Bears with plan for eventual flutter ablation once COVID is controlled.  - Restart Xarelto 15 mg daily.  - Continue nebivolol and diltiazem CD.  3. Chronic primarily diastolic CHF: Echo in 7/51 with EF 55-60%, echo this admission with EF down to 45-50% in setting of NSTEMI.  Not significantly volume overloaded.  - Continue beta blocker.   - He is on diltiazem CD for rate control, think EF is not low enough that he needs to stop. - Add low dose lisinopril 2.5 mg daily.  4. OSA: Severe, see above.  Now with oral appliance.  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  6. Disposition: Walk in halls today, home if doing ok.  Will need eventual cardiac rehab.  Followup with me via telehealth in 10 days.  Meds for discharge: ASA 81 daily x 1 month, Plavix 75 daily, Xarelto 15 daily, diltiazem CD 120 daily, nebivolol 10 daily, lisinopril 2.5 daily, Crestor 40 daily, Zetia 10 daily, dofetilide 250 mcg bid.   Length of Stay: 2  [name], MD  03/27/2019, 7:43 AM  Advanced Heart Failure Team Pager [phone] (M-F; 7a - 4p)  Please contact [facility] for night-coverage after hours (4p -7a ) and weekends on [email or web address]

## 2019-03-27 NOTE — Telephone Encounter (Signed)
-----   Message from Conrad Hartwick, NP sent at 03/27/2019  3:32 PM EDT ----- Please and thanks Amy ----- Message ----- From: Valeda Malm, RN Sent: 03/27/2019   2:54 PM EDT To: Conrad Offerman, NP  Pt was written for script for 0.3 nitro sl tabs.  Received fax asking to change to 0.4, is that ok?  Linus Orn

## 2019-03-27 NOTE — Progress Notes (Signed)
[place] for heparin>xarelto Indication: chest pain/ACS  Heparin Dosing Weight: 53.1 kg  Labs: Recent Labs    03/25/19 1023 03/25/19 1322 03/25/19 1816 03/26/19 0052 03/26/19 0348 03/27/19 0333  HGB 16.2  --   --   --  13.7 13.8  HCT 49.8  --   --   --  42.3 41.5  PLT 174  --   --   --  132* 123*  APTT  --  39*  --   --  106* 106*  HEPARINUNFRC  --  >2.20*  --   --  0.86* 0.50  CREATININE 1.49*  --   --   --  1.29* 1.29*  TROPONINI <0.03  --  0.80* 0.91*  --   --     Assessment: 82 yom with hx afib on Xarelto PTA (Last dose of was 3/28 at 2000) with CP  unstable angina. Pharmacy consulted to dose heparin for ACS. He is now s/p cath with DES. Orders to resume xarelto today.    Goal of Therapy:  Heparin level 0.3-0.7 units/ml aPTT 66-102 seconds Monitor platelets by anticoagulation protocol: Yes   Plan:  Restart xarelto 15mg  today  Erin Hearing PharmD., BCPS Clinical Pharmacist 03/27/2019 8:11 AM

## 2019-03-27 NOTE — Telephone Encounter (Signed)
See staff message

## 2019-03-27 NOTE — Discharge Summary (Signed)
Advanced Heart Failure Team  Discharge Summary   Patient ID: [name] MRN: [id number], DOB/AGE: 1936-09-10 83 y.o. Admit date: 03/25/2019 D/C date:     03/27/2019   Primary Discharge Diagnoses:  1. CAD 2. Atrial Flutter 3. Chronic Diastolic Heart Failure  4. OSA 5. Post Polio Syndrome    Hospital Course:   [name] is a 83 y.o. 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  Admitted with chest pain. See below for detailed problem list. He will follow up with Dr Aundra Dubin for a televisit on 04/09/19. I have asked him to provided weights, pulse, and blood pressure at that time.   1. CAD: NSTEMI.  Last intervention in 2009 with PCI to native LCx in setting of occluded SVG-LCx system.  3/30 cath showed 80-90% stenosis mid SVG-RCA and 95% proximal LAD stenosis with 60-70% mid LAD stenosis at take-off of large septal perforator (which had ostial 60% stenosis). Patient is now s/p DES to prox LAD and DES x 2 to mid RCA. - He will need ASA 81 x 1 month and Plavix 75 mg daily for at least 6 months, ideally 1 year.  - On heparin drip then transitioned to xarelto 15 mg daily.    - Continue Crestor/Zetia, good LDL today.  2. Atrial flutter: Atypical.  He is in flutter today, remains on Tikosyn. Noted to have periods of NSR on telemetry. He has severe OSA and now has a dental appliance.  Will wear appliance and have outpatient followup with Dr. Curt Bears with plan for eventual flutter ablation once COVID is controlled.  - Plan to resume xarelto today 3/31.   - Continue nebivolol and diltiazem CD.  3. Chronic primarily diastolic CHF:  Echo in 0/81 with EF 55-60%, echo this admission with EF down to 45-50% in setting of NSTEMI.  Not significantly volume overloaded. He will continue to take lasix as needed at home.  - Continue beta blocker.   - He is on diltiazem CD for rate control, think EF is not low enough that he needs to stop. - Lisinopril was considered for secondary prevention however [name] preferred to hold off for now. Says he has a cough with Ace.   4. OSA: Severe, see above.  Now with oral appliance.  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  6. Disposition: Walk in halls today, home if doing ok.  Will need eventual cardiac rehab.  Followup with me via telehealth in 10 days.  Meds for discharge: ASA 81 daily x 1 month, Plavix 75 daily, Xarelto 15 daily, diltiazem CD 120 daily, nebivolol 10 daily, Crestor 40 daily, Zetia 10 daily, dofetilide 250 mcg bid.   Discharge Vitals: Blood pressure 127/63, pulse 90, temperature 98.4 F (36.9 C), temperature source Oral, resp. rate 14, height 5\' 6"  (1.676 m), weight 54 kg, SpO2 94 %.  Labs: Lab Results  Component Value Date   WBC 6.1 03/27/2019   HGB 13.8 03/27/2019   HCT 41.5 03/27/2019   MCV 98.6 03/27/2019   PLT 123 (L) 03/27/2019    Recent Labs  Lab 03/27/19 0333  NA 136  K 4.2  CL 105  CO2 24  BUN 17  CREATININE 1.29*  CALCIUM 9.2  GLUCOSE 83   Lab Results  Component Value Date   CHOL 111 03/27/2019   HDL 47 03/27/2019   LDLCALC 53 03/27/2019   TRIG 53 03/27/2019   BNP (last 3 results) No results for input(s): BNP in the last 8760 hours.  ProBNP (last 3 results) No results for input(s): PROBNP in the last 8760 hours.   Diagnostic Studies/Procedures   LHC 03/26/19  Ost LAD to Mid LAD lesion is 80% stenosed.  A drug-eluting stent was successfully placed using a STENT SYNERGY DES 2.75X38, postdilated to 3 mm.  Post intervention, there is a 0% residual stenosis.  Mid RCA lesion is 100% stenosed. SVG to RCA with 80%  lesion.  A drug-eluting stent was successfully placed using a STENT SYNERGY DES 3X16. At the proximal edge of the stent, An overlapping drug-eluting stent was successfully placed using a STENT SYNERGY DES 2.75X8.  Post intervention, there is a 0% residual stenosis.  Continue aggressive secondary prevention.     Dg Chest Portable 1 View  Result Date: 03/25/2019 CLINICAL DATA:  Chest pain, shortness of breath. EXAM: PORTABLE CHEST 1 VIEW COMPARISON:  Radiographs of October 03, 2017. FINDINGS: Stable cardiomediastinal silhouette. Status post coronary bypass graft. No pneumothorax or pleural effusion is noted. Stable right apical scarring is noted. No acute pulmonary disease is noted. Stable elevated left hemidiaphragm is noted. Bony thorax is unremarkable. IMPRESSION: No acute cardiopulmonary abnormality seen. Electronically Signed   By: Marijo Conception, M.D.   On: 03/25/2019 11:15    Discharge Medications   Allergies as of 03/27/2019      Reactions   Morphine And Related Other (See Comments)   IV forms- vein irritation   Tape    Skin Irritation      Medication List    TAKE these medications   acetaminophen 500 MG tablet Commonly known as:  TYLENOL Take 500 mg by mouth every 8 (eight) hours as needed for mild pain or moderate pain.   albuterol 108 (90 Base) MCG/ACT inhaler Commonly known as:  ProAir HFA Inhale 2 puffs into the lungs every 4 (four) hours as needed for wheezing or shortness of breath (and prior to exercise).   Align 4 MG Caps Take 4 mg by mouth daily.   AMBULATORY NON FORMULARY MEDICATION Apply 1 application topically daily. Testosterone cream 150mg /15% Place 15% onto the skin daily   aspirin 81 MG EC tablet Take 1 tablet (81 mg total) by mouth daily.   Bystolic 10 MG tablet Generic drug:  nebivolol TAKE 1 TABLET BY MOUTH EVERY DAY   clopidogrel 75 MG tablet Commonly known as:  PLAVIX Take 1 tablet (75 mg total) by mouth daily with breakfast. Start taking  on:  March 28, 2019   dicyclomine 10 MG capsule Commonly known as:  BENTYL Take 1 capsule (10 mg total) by mouth 3 (three) times daily before meals. What changed:    when to take this  reasons to take this   diltiazem 120 MG 24 hr capsule Commonly known as:  CARDIZEM CD TAKE ONE CAPSULE BY MOUTH ONCE EACH DAY. What changed:  See the new instructions.   dofetilide 250 MCG capsule Commonly known as:  TIKOSYN TAKE 1 CAPSULE BY MOUTH EVERY 12 HOURS *NEED OFFICE VISIT* What changed:  See the new instructions.   ezetimibe 10 MG tablet Commonly known as:  ZETIA TAKE 1 TABLET BY MOUTH  EVERY DAY   famotidine 40 MG tablet Commonly known as:  PEPCID Take 1 tablet (40 mg total) by mouth at bedtime.   furosemide 40 MG tablet Commonly known as:  LASIX Take 20-40 mg by mouth daily.   lactobacillus acidophilus & bulgar chewable tablet Chew 2 tablets by mouth daily.   loperamide 2 MG capsule Commonly known as:  IMODIUM Take by mouth as needed for diarrhea or loose stools.   LORazepam 0.5 MG tablet Commonly known as:  ATIVAN Take 0.5 mg by mouth at bedtime as needed for sleep.   methocarbamol 500 MG tablet Commonly known as:  ROBAXIN Take 500 mg by mouth 3 (three) times daily as needed for muscle spasms.   nitroGLYCERIN 0.3 MG SL tablet Commonly known as:  Nitrostat Place 1 tablet (0.3 mg total) under the tongue every 5 (five) minutes as needed for chest pain.   potassium chloride SA 20 MEQ tablet Commonly known as:  K-DUR,KLOR-CON Take 20 mEq by mouth daily.   rosuvastatin 40 MG tablet Commonly known as:  CRESTOR Take 40 mg by mouth every evening.   sodium chloride 0.65 % Soln nasal spray Commonly known as:  OCEAN Place 1 spray into both nostrils as needed for congestion.   traZODone 50 MG tablet Commonly known as:  DESYREL Take 50 mg by mouth at bedtime.   VITAMIN B COMPLEX PO Take 1 tablet by mouth daily.   Vitamin D (Ergocalciferol) 1.25 MG (50000 UT) Caps  capsule Commonly known as:  DRISDOL Take 50,000 Units by mouth every 14 (fourteen) days. Take on 1st and 15th   Xarelto 15 MG Tabs tablet Generic drug:  Rivaroxaban TAKE 1 TABLET EVERY EVENING       Disposition   The patient will be discharged in stable condition to home. Discharge Instructions    (HEART FAILURE PATIENTS) Call MD:  Anytime you have any of the following symptoms: 1) 3 pound weight gain in 24 hours or 5 pounds in 1 week 2) shortness of breath, with or without a dry hacking cough 3) swelling in the hands, feet or stomach 4) if you have to sleep on extra pillows at night in order to breathe.   Complete by:  As directed    AMB Referral to Cardiac Rehabilitation - Phase II   Complete by:  As directed    Diagnosis:   Coronary Stents NSTEMI     Diet - low sodium heart healthy   Complete by:  As directed    Heart Failure patients record your daily weight using the same scale at the same time of day   Complete by:  As directed    Increase activity slowly   Complete by:  As directed    STOP any activity that causes chest pain, shortness of breath, dizziness, sweating, or exessive weakness   Complete by:  As directed      Follow-up Information    Larey Dresser, MD Follow up on 04/09/2019.   Specialty:  Cardiology Why:  This is a telehelath visit with Dr Aundra Dubin 9:00 Contact information: [address] [place] [address] [phone]             Duration of Discharge Encounter: Greater than 35 minutes   Signed, Darrick Grinder NP-C  03/27/2019, 8:51 AM

## 2019-03-27 NOTE — Progress Notes (Signed)
CARDIAC REHAB PHASE I   PRE:  Rate/Rhythm: 92 ?NSR  BP:  Supine:   Sitting: 139/69  Standing:    SaO2: 96%RA  MODE:  Ambulation: 570 ft   POST:  Rate/Rhythm: 93 ?NSR   BP:  Supine:   Sitting: 138/66  Standing:    SaO2: 95%RA 0828-0915 Pt walked 570 ft on RA with steady gait independently. No CP. Tolerated well. MI education completed with pt who voiced understanding. Reviewed importance of plavix with stent, NTG use, MI restrictions, ex ed, gave heart healthy diet and CRP 2. Referred to [place] CRP 2. Pt knows to work on walking until program reopens. He has attended program before.. Discussed with pt that A1C 6.2 and encouraged to watch carbs in addition to sodium and fats.    Graylon Good, RN BSN  03/27/2019 9:08 AM

## 2019-03-27 NOTE — Progress Notes (Signed)
Discharge instructions (including medications) discussed with and copy provided to patient/caregiver 

## 2019-03-28 ENCOUNTER — Telehealth (HOSPITAL_COMMUNITY): Payer: Self-pay

## 2019-03-28 NOTE — Telephone Encounter (Signed)
Called patient to see if he is interested in the Cardiac Rehab Program. Patient expressed interest. Explained scheduling process and went over insurance, patient verbalized understanding. Adv pt our department is closed due to COVID-19 and once we resume scheduling we will contact him. °

## 2019-03-28 NOTE — Telephone Encounter (Signed)
Pt insurance is active and benefits verified through Medicare A/B. Co-pay $0.00, DED $198.00/$198.00 met, out of pocket $0.00/$0.00 met, co-insurance 20%. No pre-authorization required. Passport, 03/28/2019 @ 3:32pm, REF# [id number]  2ndary insurance is active and benefits verified through [facility]. Co-pay $0.00, DED $0.00/$0.00 met, out of pocket $0.00/$0.00 met, co-insurance 0%. No pre-authorization required. Passport, 03/28/2019 @ 3:34PM, REF# [id number]  Will contact patient to see if he is interested in the Cardiac Rehab Program. If interested, patient will need to complete follow up appt. Once completed, patient will be contacted for scheduling upon review by the RN Navigator.

## 2019-04-04 ENCOUNTER — Telehealth (HOSPITAL_COMMUNITY): Payer: Self-pay | Admitting: Cardiology

## 2019-04-04 NOTE — Telephone Encounter (Signed)
Pt called to request most recent lab results faxed to him @ [phone] CBC BMET  LIPIDS  As requested fax sent

## 2019-04-09 ENCOUNTER — Other Ambulatory Visit: Payer: Self-pay

## 2019-04-09 ENCOUNTER — Ambulatory Visit (HOSPITAL_COMMUNITY)
Admit: 2019-04-09 | Discharge: 2019-04-09 | Disposition: A | Discharge status: Home / Self Care | Payer: Medicare Other | Attending: Cardiology | Admitting: Cardiology

## 2019-04-09 ENCOUNTER — Telehealth (HOSPITAL_COMMUNITY): Payer: Self-pay

## 2019-04-09 DIAGNOSIS — I484 Atypical atrial flutter: Secondary | ICD-10-CM

## 2019-04-09 DIAGNOSIS — I5032 Chronic diastolic (congestive) heart failure: Secondary | ICD-10-CM

## 2019-04-09 DIAGNOSIS — I2510 Atherosclerotic heart disease of native coronary artery without angina pectoris: Secondary | ICD-10-CM

## 2019-04-09 MED ORDER — LOSARTAN POTASSIUM 25 MG PO TABS: 12.5000 mg | ORAL_TABLET | Freq: Every day | ORAL | 3 refills | Status: DC

## 2019-04-09 NOTE — Progress Notes (Signed)
Heart Failure TeleHealth Note  Due to national recommendations of social distancing due to [name], Audio/video telehealth visit is felt to be most appropriate for this patient at this time.  See MyChart message from today for patient consent regarding telehealth for [name].  Date:  04/09/2019   ID:  [name], DOB [birth year], MRN [id number]  Location: Home  Provider location: [facility] Type of Visit: Established patient   PCP:  Jimmy Bunting, MD  Cardiologist:  No primary care provider on file. Primary HF: [name]  Chief Complaint: Shortness of breath   History of Present Illness: [name] is a 83 y.o. male who presents via audio/video conferencing for a telehealth visit today.     he denies symptoms worrisome for COVID Weiss.   Patient has history of CAD s/p CABG.  His last cath was in 11/09.  The SVG-distal RCA was patent, the CFX system was patent.  His LIMA was atretic and there were serial 60% and 80% stenoses in the native LAD.  He was managed medically.  Echo in 8/14 showed EF 55-60% with moderate MR and moderate TR.   He was initially noted to have atrial fibrillation in the summer of 2014. He was started on Xarelto and cardioverted to NSR in 8/14.  Recurrent atrial fibrillation was noted in 1/15, and he was cardioverted to NSR again.  This time, NSR did not hold long. By 5/15, he was in persistent atrial fibrillation.  I referred him to [name] where he had atrial fibrillation ablation and Tikosyn loading.  Ranolazine was stopped due to risk of QT prolongation and Imdur was started as an anti-anginal instead.  Lexiscan Cardiolite in 11/15 showed no ischemia.    CTA chest done in 2/16 to look for evidence for PV stenosis post-AF ablation.  This showed mild short-segment narrowing of the left inferior pulmonary vein.   Patient was admitted in 5/18 with acute on chronic diastolic CHF.  He had been [place] the beach for [place] days and ate  out a number of times, probably getting a significant sodium load.  No chest pain.  He was in normal sinus rhythm.  He was started on IV Lasix and diuresed.  Echo in 5/18 showed EF 65-70% with moderately dilated RV and normal systolic function, PASP 57 mmHg.  Lexiscan Cardiolite in 6/18 showed no significant perfusion defect.   He was admitted with NSTEMI in 3/20, had DES to ostial LAD and mid SVG-RCA.  Echo showed EF 45-50%.  He has been in atypical atrial flutter persistently despite Tikosyn.  He has been using his oral appliance for sleep apnea.   He has had no further chest pain.  No palpitations, HR controlled in 60s-70s.  No chest pain.  Mild dyspnea walking up a hill.  He has occasional gum bleeding. No dyspnea walking on flat ground, orthopnea, or lightheadedness.  Weight has been stable.   Labs (8/12): K 4.1, creatinine 1.2, LDL 91, HDL 53 Labs (8/14): K 4.6, creatinine 1.1 Labs (11/14): K 4.6, creatinine 1.2, LDL 91, HDL 56 Labs (3/15): AST 38, ALT 32, TSH normal, BNP 237 Labs (7/15): LDL 96, HDL 48, K 4.2, creatinine 1.2, TSH normal, BNP 237, AST 38, ALT 32 Labs (8/15): LFTs normal Labs (11/15): K 4.2, creatinine 1.1, Mg 2.2, BNP 227 Labs (2/16): K 4, creatinine 1.32, BNP 261 Labs (6/18): K 3.9, creatinine 1.6 Labs (7/18): K 3.9, creatinine 1.5, BNP 242 Labs (12/18): K  3.8, creatinine 1.4, hgb 13.9, LDL 73, HDL 52 Labs (3/Weiss): K 3.7, creatinine 1.4, LDL 76, HDL 42 Labs (6/Weiss): K 4.1, creatinine 1.63 Labs (3/20): K 4.2, creatinine 1.29, LDL 53, HDL 47, hgb 13.8  PMH: 1. CAD: 1st MI in 1988.  CABG 1996.  PCI to CFX in 2009.  LHC (11/09) SVG-dRCA patent, total occlusion RCA, patent CFX stent, atretic LIMA, serial 60 and 80% proximal LAD stenoses, EF 60% with basal inferior hypokinesis.  [name] in 2011 with no ischemia or infarction.  Echo (10/12) with EF 55-60%, mild LVH, mild MR.  Lexiscan Cardiolite in 2013 with no ischemia or infarction.  Echo (8/14) with EF 55-60%, moderate MR,  moderate TR, PA systolic pressure 35 mmHg.  Echo (4/15) with EF 60-65%, mild focal basal septal hypertrophy, inferior basal akinesis, mild MR.  Lexiscan cardiolite (11/15) with EF 61%, fixed basal inferoseptal defect, no ischemia.  - Lexiscan Cardiolite (6/18): EF 54%, no perfusion defect.  - NSTEMI 3/20, cath showed 95% ostial/proximal LAD, 60-70% mid LAD, totally occluded SVG-LCx, totally occluded RCA, 80-90% mid SVG-RCA.  Patient had DES to ostial-mid LAD and DES to SVG-RCA.  2. Raynauds syndrome 3. Post-polio syndrome 4. GERD with dilation of esophageal stricture in 12/12.  5. Hyperlipidemia 6. Brainstem stroke with Horner's syndrome.  7. Scoliosis. 8. H/o appendectomy 9. Herpes Zoster 10. Atrial fibrillation: DCCV to NSR in 8/14. DCCV to NSR in 1/15. Atrial fibrillation ablation 6/15 with Tikosyn loading (at [name]).   11. PFTs (4/15) with FVC 59%, FEV1 54%, ratio 91%, DLCO 53% => moderate obstructive defect thought to be related to COPD and severe restrictive defect thought to be due to elevated left hemidiaphragm and post-polio syndrome.  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  Echo (5/18) with EF 65-70%, RV moderately dilated with normal systolic function, PASP 57 mmHg.  - Echo (3/20): EF 45-50%, mild LV dilation, basal inferolateral and inferior hypokinesis.  13. CKD: Stage 3.  14. OSA: dental appliance.    Current Outpatient Medications  Medication Sig Dispense Refill  . acetaminophen (TYLENOL) 500 MG tablet Take 500 mg by mouth every 8 (eight) hours as needed for mild pain or moderate pain.     [name] albuterol (PROAIR HFA) 108 (90 BASE) MCG/ACT inhaler Inhale 2 puffs into the lungs every 4 (four) hours as needed for wheezing or shortness of breath (and prior to exercise). 1 Inhaler 3  . AMBULATORY NON FORMULARY MEDICATION Apply 1 application topically daily. Testosterone cream 150mg /15% Place 15% onto the skin daily     . aspirin EC 81 MG EC tablet Take 1 tablet (81 mg total) by mouth daily. 30  tablet 12  . B Complex Vitamins (VITAMIN B COMPLEX PO) Take 1 tablet by mouth daily.    [name] BYSTOLIC 10 MG tablet TAKE 1 TABLET BY MOUTH EVERY DAY 90 tablet 1  . clopidogrel (PLAVIX) 75 MG tablet Take 1 tablet (75 mg total) by mouth daily with breakfast. 30 tablet 6  . dicyclomine (BENTYL) 10 MG capsule Take 1 capsule (10 mg total) by mouth 3 (three) times daily before meals. (Patient taking differently: Take 10 mg by mouth as needed for spasms. ) 90 capsule 11  . diltiazem (CARDIZEM CD) 120 MG 24 hr capsule TAKE ONE CAPSULE BY MOUTH ONCE EACH DAY. (Patient taking differently: Take 120 mg by mouth daily. ) 90 capsule 3  . dofetilide (TIKOSYN) 250 MCG capsule TAKE 1 CAPSULE BY MOUTH EVERY 12 HOURS *NEED OFFICE VISIT* (Patient taking differently: Take 250 mcg by mouth  2 (two) times daily. ) 180 capsule 3  . ezetimibe (ZETIA) 10 MG tablet TAKE 1 TABLET BY MOUTH EVERY DAY 90 tablet 0  . famotidine (PEPCID) 40 MG tablet Take 1 tablet (40 mg total) by mouth at bedtime. 30 tablet 11  . furosemide (LASIX) 40 MG tablet Take 20-40 mg by mouth daily.     [name] lactobacillus acidophilus & bulgar (LACTINEX) chewable tablet Chew 2 tablets by mouth daily.     [name] loperamide (IMODIUM) 2 MG capsule Take by mouth as needed for diarrhea or loose stools.    [name] LORazepam (ATIVAN) 0.5 MG tablet Take 0.5 mg by mouth at bedtime as needed for sleep.     [name] losartan (COZAAR) 25 MG tablet Take 0.5 tablets (12.5 mg total) by mouth daily. 45 tablet 3  . methocarbamol (ROBAXIN) 500 MG tablet Take 500 mg by mouth 3 (three) times daily as needed for muscle spasms.     . nitroGLYCERIN (NITROSTAT) 0.4 MG SL tablet Place 1 tablet (0.4 mg total) under the tongue every 5 (five) minutes as needed for chest pain. 25 tablet 0  . potassium chloride SA (K-DUR,KLOR-CON) 20 MEQ tablet Take 20 mEq by mouth daily.    . Probiotic Product (ALIGN) 4 MG CAPS Take 4 mg by mouth daily.    . rosuvastatin (CRESTOR) 40 MG tablet Take 40 mg by mouth every  evening.    . sodium chloride (OCEAN) 0.65 % SOLN nasal spray Place 1 spray into both nostrils as needed for congestion.    . traZODone (DESYREL) 50 MG tablet Take 50 mg by mouth at bedtime.    . Vitamin D, Ergocalciferol, (DRISDOL) 50000 UNITS CAPS capsule Take 50,000 Units by mouth every 14 (fourteen) days. Take on 1st and 15th    . XARELTO 15 MG TABS tablet TAKE 1 TABLET EVERY EVENING 90 tablet 1   No current facility-administered medications for this encounter.     Allergies:   Morphine and related and Tape   Social History:  The patient  reports that he quit smoking about 52 years ago. His smoking use included cigarettes. He has a 2.50 pack-year smoking history. He quit smokeless tobacco use about 70 years ago. He reports that he does not drink alcohol or use drugs.   Family History:  The patient's family history includes Cancer in his son; Heart Problems in his brother; Heart disease in his father, maternal grandfather, maternal grandmother, mother, paternal grandfather, and paternal grandmother; Prostate cancer in his paternal uncle.   ROS:  Please see the history of present illness.   All other systems are personally reviewed and negative.   Exam:  (Video/Tele Health Call; Exam is subjective and or/visual.) BP 119/67, HR 60s-70s (obtained by patient).  General:  Speaks in full sentences. No resp difficulty. HEENT: No JVD.  Lungs: Normal respiratory effort with conversation.  Abdomen: Non-distended per patient report Extremities: Pt denies edema. Neuro: Alert & oriented x 3.   Recent Labs: 12/15/2018: Magnesium 2.4 01/04/2019: ALT 26 03/27/2019: BUN 17; Creatinine, Ser 1.29; Hemoglobin 13.8; Platelets 123; Potassium 4.2; Sodium 136  Personally reviewed   Wt Readings from Last 3 Encounters:  03/27/19 54 kg (119 lb 1.6 oz)  02/19/19 54 kg (119 lb)  01/30/19 54.4 kg (120 lb)      ASSESSMENT AND PLAN:  1. Atrial fibrillation: He has been on Tikosyn and is s/p atrial  fibrillation [facility] on [facility].  He now is in a persistent atypical atrial flutter.  He has been followed by Dr. Curt Bears with plan for ablation once he has started on treatment for his OSA, now using oral appliance regularly. - Continue Tikosyn.  I will arrange for BMET in 10 days.  - Continue Xarelto.  - He has been on nebivolol and diltiazem CD for rate control.   - I will refer him back to Dr. Curt Bears for evaluation for atrial flutter ablation now that he is being treated for sleep apnea.   2. CAD: H/o CABG.  He had NSTEMI in 3/20 with DES to ostial/proximal LAD and SVG-RCA.  No further chest pain.  - Continue ASA 81 x 1 month, stop on 4/31.  - Continue Plavix at least 6 months, ideally 1 year.  - He will continue Xarelto 15 mg daily long-term for atrial fibrillation/flutter.  - he is on statin and Zetia.  3. Hyperlipidemia: He is taking Crestor 40 mg daily and Zetia 10 mg daily. Good lipids in 3/20.     4. Ischemic cardiomyopathy/primarily diastolic CHF: Echo in 7/34 with EF 45-50%.  NYHA class II.  Weight stable.   - Continue Lasix 40 qd   - Continue nebivolol.  - He had cough with ACEI, will start losartan 12.5 mg daily with BMET 10 days.   5. Pulmonary: PFTs suggested both a restrictive and obstructive defect.  The restrictive defect is likely from post-polio syndrome and elevated left hemidiaphragm.  Mr Grove never smoked much, so the obstructive defect is more surprising.  6. OSA: Severe, likely potentiates his atrial fibrillation. He has had a hard time tolerating CPAP.   - Now using an oral appliance.    COVID screen The patient does not have any symptoms that suggest any further testing/ screening at this time.  Social distancing reinforced today.  Relevant cardiac medications were reviewed at length with the patient today.   The patient does not have concerns regarding their medications at this time.   Recommended follow-up:  2 months  Today, I have spent 22 minutes  with the patient with telehealth technology discussing the above issues .    Signed, [name], MD  04/09/2019 1:31 PM  [facility] [address] [facility] [place] [address] [phone] (office) [phone] (fax)

## 2019-04-09 NOTE — Telephone Encounter (Signed)
AVS mailed with next appointment card/bmet script

## 2019-04-09 NOTE — Patient Instructions (Addendum)
Start losartan 12.5 mg daily (called into [facility])   You will need lab work in approximately 10 days, this prescription has been mailed to you.   Followup in 2 months, this has been scheduled for 29 June @ 10:40am in the office.

## 2019-04-15 ENCOUNTER — Other Ambulatory Visit (HOSPITAL_COMMUNITY): Payer: Self-pay | Admitting: Adult Health

## 2019-04-24 ENCOUNTER — Other Ambulatory Visit: Payer: Self-pay

## 2019-04-24 ENCOUNTER — Ambulatory Visit (HOSPITAL_COMMUNITY)
Admission: RE | Admit: 2019-04-24 | Discharge: 2019-04-24 | Disposition: A | Discharge status: Home / Self Care | Payer: Medicare Other | Source: Ambulatory Visit | Attending: Cardiology | Admitting: Cardiology

## 2019-04-24 DIAGNOSIS — I5032 Chronic diastolic (congestive) heart failure: Secondary | ICD-10-CM | POA: Diagnosis not present

## 2019-04-24 LAB — BASIC METABOLIC PANEL
Anion gap: 9 (ref 5–15)
BUN: 13 mg/dL (ref 8–23)
CO2: 26 mmol/L (ref 22–32)
Calcium: 10.2 mg/dL (ref 8.9–10.3)
Chloride: 102 mmol/L (ref 98–111)
Creatinine, Ser: 1.5 mg/dL — ABNORMAL HIGH (ref 0.61–1.24)
GFR calc Af Amer: 50 mL/min — ABNORMAL LOW (ref >60)
GFR calc non Af Amer: 43 mL/min — ABNORMAL LOW (ref >60)
Glucose, Bld: 118 mg/dL — ABNORMAL HIGH (ref 70–99)
Potassium: 4.3 mmol/L (ref 3.5–5.1)
Sodium: 137 mmol/L (ref 135–145)

## 2019-04-25 ENCOUNTER — Telehealth (HOSPITAL_COMMUNITY): Payer: Self-pay | Admitting: *Deleted

## 2019-04-25 DIAGNOSIS — I480 Paroxysmal atrial fibrillation: Secondary | ICD-10-CM | POA: Diagnosis not present

## 2019-04-25 DIAGNOSIS — N183 Chronic kidney disease, stage 3 (moderate): Secondary | ICD-10-CM | POA: Diagnosis not present

## 2019-04-25 DIAGNOSIS — Z1339 Encounter for screening examination for other mental health and behavioral disorders: Secondary | ICD-10-CM | POA: Diagnosis not present

## 2019-04-25 DIAGNOSIS — I509 Heart failure, unspecified: Secondary | ICD-10-CM | POA: Diagnosis not present

## 2019-04-25 DIAGNOSIS — E785 Hyperlipidemia, unspecified: Secondary | ICD-10-CM | POA: Diagnosis not present

## 2019-04-25 DIAGNOSIS — I2510 Atherosclerotic heart disease of native coronary artery without angina pectoris: Secondary | ICD-10-CM | POA: Diagnosis not present

## 2019-04-25 DIAGNOSIS — I8390 Asymptomatic varicose veins of unspecified lower extremity: Secondary | ICD-10-CM | POA: Diagnosis not present

## 2019-04-25 DIAGNOSIS — G4700 Insomnia, unspecified: Secondary | ICD-10-CM | POA: Diagnosis not present

## 2019-04-25 DIAGNOSIS — E291 Testicular hypofunction: Secondary | ICD-10-CM | POA: Diagnosis not present

## 2019-04-25 DIAGNOSIS — Z0000 Encounter for general adult medical examination without abnormal findings: Secondary | ICD-10-CM | POA: Diagnosis not present

## 2019-04-25 DIAGNOSIS — Z1331 Encounter for screening for depression: Secondary | ICD-10-CM | POA: Diagnosis not present

## 2019-04-25 DIAGNOSIS — G4730 Sleep apnea, unspecified: Secondary | ICD-10-CM | POA: Diagnosis not present

## 2019-04-25 DIAGNOSIS — I130 Hypertensive heart and chronic kidney disease with heart failure and stage 1 through stage 4 chronic kidney disease, or unspecified chronic kidney disease: Secondary | ICD-10-CM | POA: Diagnosis not present

## 2019-04-25 DIAGNOSIS — M8580 Other specified disorders of bone density and structure, unspecified site: Secondary | ICD-10-CM | POA: Diagnosis not present

## 2019-04-25 NOTE — Telephone Encounter (Signed)
Called and spoke to pt and his wife regarding Cardiac Rehab continued closure due to adherence of national recommendation for Covid - 19 in group setting.  Pt verbalized understanding. Pt is doing well with exercise and heart healthy nutrition.  Pt previously participated in the CR in 2009.  Will send educational exercise handouts along with heart healthy tips. Cherre Huger, BSN Cardiac and Training and development officer

## 2019-05-01 ENCOUNTER — Other Ambulatory Visit (HOSPITAL_COMMUNITY): Payer: Self-pay | Admitting: Cardiology

## 2019-05-02 ENCOUNTER — Telehealth (HOSPITAL_COMMUNITY): Payer: Self-pay | Admitting: Cardiology

## 2019-05-02 NOTE — Telephone Encounter (Signed)
Cath was over a month ago.  Needs to come by office to have his wrist looked at.

## 2019-05-02 NOTE — Telephone Encounter (Signed)
Patient called with multiple concerns Medication questions addressed  However patient also reports swelling and pain at L wrist, area where cath was done. Reports size of ?hematoma has increased   Advised would forward to provider for further advise

## 2019-05-03 ENCOUNTER — Other Ambulatory Visit: Payer: Self-pay

## 2019-05-03 ENCOUNTER — Ambulatory Visit (HOSPITAL_COMMUNITY)
Admission: RE | Admit: 2019-05-03 | Discharge: 2019-05-03 | Disposition: A | Discharge status: Home / Self Care | Payer: Medicare Other | Source: Ambulatory Visit | Attending: Internal Medicine | Admitting: Internal Medicine

## 2019-05-03 ENCOUNTER — Ambulatory Visit (HOSPITAL_BASED_OUTPATIENT_CLINIC_OR_DEPARTMENT_OTHER)
Admission: RE | Admit: 2019-05-03 | Discharge: 2019-05-03 | Disposition: A | Discharge status: Home / Self Care | Payer: Medicare Other | Source: Ambulatory Visit | Attending: Cardiology | Admitting: Cardiology

## 2019-05-03 ENCOUNTER — Encounter (HOSPITAL_COMMUNITY): Payer: Self-pay | Admitting: Cardiology

## 2019-05-03 VITALS — BP 110/62 | HR 95 | Wt 126.0 lb

## 2019-05-03 DIAGNOSIS — G14 Postpolio syndrome: Secondary | ICD-10-CM | POA: Diagnosis not present

## 2019-05-03 DIAGNOSIS — Z8249 Family history of ischemic heart disease and other diseases of the circulatory system: Secondary | ICD-10-CM | POA: Diagnosis not present

## 2019-05-03 DIAGNOSIS — Z7902 Long term (current) use of antithrombotics/antiplatelets: Secondary | ICD-10-CM | POA: Diagnosis not present

## 2019-05-03 DIAGNOSIS — T81718A Complication of other artery following a procedure, not elsewhere classified, initial encounter: Secondary | ICD-10-CM | POA: Diagnosis not present

## 2019-05-03 DIAGNOSIS — Z7982 Long term (current) use of aspirin: Secondary | ICD-10-CM | POA: Insufficient documentation

## 2019-05-03 DIAGNOSIS — I255 Ischemic cardiomyopathy: Secondary | ICD-10-CM | POA: Insufficient documentation

## 2019-05-03 DIAGNOSIS — I5032 Chronic diastolic (congestive) heart failure: Secondary | ICD-10-CM | POA: Insufficient documentation

## 2019-05-03 DIAGNOSIS — Z8673 Personal history of transient ischemic attack (TIA), and cerebral infarction without residual deficits: Secondary | ICD-10-CM | POA: Diagnosis not present

## 2019-05-03 DIAGNOSIS — I480 Paroxysmal atrial fibrillation: Secondary | ICD-10-CM

## 2019-05-03 DIAGNOSIS — I2510 Atherosclerotic heart disease of native coronary artery without angina pectoris: Secondary | ICD-10-CM | POA: Insufficient documentation

## 2019-05-03 DIAGNOSIS — Z951 Presence of aortocoronary bypass graft: Secondary | ICD-10-CM | POA: Diagnosis not present

## 2019-05-03 DIAGNOSIS — Z7901 Long term (current) use of anticoagulants: Secondary | ICD-10-CM | POA: Diagnosis not present

## 2019-05-03 DIAGNOSIS — Z885 Allergy status to narcotic agent status: Secondary | ICD-10-CM | POA: Insufficient documentation

## 2019-05-03 DIAGNOSIS — K219 Gastro-esophageal reflux disease without esophagitis: Secondary | ICD-10-CM | POA: Diagnosis not present

## 2019-05-03 DIAGNOSIS — I7300 Raynaud's syndrome without gangrene: Secondary | ICD-10-CM | POA: Diagnosis not present

## 2019-05-03 DIAGNOSIS — I252 Old myocardial infarction: Secondary | ICD-10-CM | POA: Insufficient documentation

## 2019-05-03 DIAGNOSIS — I729 Aneurysm of unspecified site: Secondary | ICD-10-CM

## 2019-05-03 DIAGNOSIS — I484 Atypical atrial flutter: Secondary | ICD-10-CM | POA: Insufficient documentation

## 2019-05-03 DIAGNOSIS — G4733 Obstructive sleep apnea (adult) (pediatric): Secondary | ICD-10-CM | POA: Insufficient documentation

## 2019-05-03 DIAGNOSIS — I4891 Unspecified atrial fibrillation: Secondary | ICD-10-CM | POA: Diagnosis not present

## 2019-05-03 DIAGNOSIS — E785 Hyperlipidemia, unspecified: Secondary | ICD-10-CM | POA: Diagnosis not present

## 2019-05-03 DIAGNOSIS — N183 Chronic kidney disease, stage 3 (moderate): Secondary | ICD-10-CM | POA: Insufficient documentation

## 2019-05-03 DIAGNOSIS — X58XXXA Exposure to other specified factors, initial encounter: Secondary | ICD-10-CM | POA: Insufficient documentation

## 2019-05-03 DIAGNOSIS — Z79899 Other long term (current) drug therapy: Secondary | ICD-10-CM | POA: Diagnosis not present

## 2019-05-03 DIAGNOSIS — M419 Scoliosis, unspecified: Secondary | ICD-10-CM | POA: Insufficient documentation

## 2019-05-03 DIAGNOSIS — Z955 Presence of coronary angioplasty implant and graft: Secondary | ICD-10-CM | POA: Diagnosis not present

## 2019-05-03 DIAGNOSIS — Z888 Allergy status to other drugs, medicaments and biological substances status: Secondary | ICD-10-CM | POA: Insufficient documentation

## 2019-05-03 DIAGNOSIS — Z87891 Personal history of nicotine dependence: Secondary | ICD-10-CM | POA: Insufficient documentation

## 2019-05-03 NOTE — Patient Instructions (Signed)
Ultrasound will be done today to further evaluate your wrist  You have been referred to Dr Trula Slade [facility]  Your physician recommends that you schedule a follow-up appointment in: 2 months

## 2019-05-03 NOTE — Progress Notes (Signed)
RUE arterial limited       has been completed. Preliminary results can be found under CV proc through chart review. June Leap, BS, RDMS, RVT

## 2019-05-03 NOTE — Telephone Encounter (Signed)
Pt agreeable to return for evaluation Nurse visit 5/7 @ 2

## 2019-05-04 NOTE — Progress Notes (Signed)
Heart Failure TeleHealth Note  Date:  05/04/2019   ID:  EVERT WENRICH, DOB [birth year], MRN [id number]  Location: Home  Provider location: [facility] Type of Visit: Established patient   PCP:  Burnard Bunting, MD  Cardiologist:  [name]  Chief Complaint: Shortness of breath   History of Present Illness: LIBRADO GUANDIQUE is a 83 y.o. male who presents for followup of CAD and CHF.     he denies symptoms worrisome for COVID 19.   Patient has history of CAD s/p CABG.  Cath in 11/09 showed that the SVG-distal RCA was patent, the CFX system was patent.  His LIMA was atretic and there were serial 60% and 80% stenoses in the native LAD.  He was managed medically.  Echo in 8/14 showed EF 55-60% with moderate MR and moderate TR.   He was initially noted to have atrial fibrillation in the summer of 2014. He was started on Xarelto and cardioverted to NSR in 8/14.  Recurrent atrial fibrillation was noted in 1/15, and he was cardioverted to NSR again.  This time, NSR did not hold long. By 5/15, he was in persistent atrial fibrillation.  I referred him to [facility] where he had atrial fibrillation ablation and Tikosyn loading.  Ranolazine was stopped due to risk of QT prolongation and Imdur was started as an anti-anginal instead.  Lexiscan Cardiolite in 11/15 showed no ischemia.    CTA chest done in 2/16 to look for evidence for PV stenosis post-AF ablation.  This showed mild short-segment narrowing of the left inferior pulmonary vein.   Patient was admitted in 5/18 with acute on chronic diastolic CHF.  He had been at the beach for [place] days and ate out a number of times, probably getting a significant sodium load.  No chest pain.  He was in normal sinus rhythm.  He was started on IV Lasix and diuresed.  Echo in 5/18 showed EF 65-70% with moderately dilated RV and normal systolic function, PASP 57 mmHg.  Lexiscan Cardiolite in 6/18 showed no significant perfusion defect.   He  was admitted with NSTEMI in 3/20, had DES to ostial LAD and mid SVG-RCA.  Echo showed EF 45-50%.  He has been in atypical atrial flutter persistently despite Tikosyn.  He has been using his oral appliance for sleep apnea.   Patient presents for an acute visit today.  His cath/PCI was about 6 weeks ago.  He notes that after the cath, the TR band had to be re-inflated twice due to bleeding.  Initially, his wrist was ok.  However, after about 3 wks, he started to develop a "bump" at his cath site.  This mass had a palpable pulse.  The mass slowly increased in size, now about 1 cm.  Vascular US was done today, showing pseudoaneurysm.  He has a good pulse distal the PSA.  He has some achiness in his wrist but not neuromuscular deficiency in the hand.   Otherwise, no complaints.  He is actually back in NSR today.  He is now off ASA.  No dyspnea walking up stairs or walking on flat ground.  No chest pain.   ECG (personally reviewed): sinus brady at 50 bpm.   Labs (8/12): K 4.1, creatinine 1.2, LDL 91, HDL 53 Labs (8/14): K 4.6, creatinine 1.1 Labs (11/14): K 4.6, creatinine 1.2, LDL 91, HDL 56 Labs (3/15): AST 38, ALT 32, TSH normal, BNP 237 Labs (7/15): LDL 96, HDL 48,  K 4.2, creatinine 1.2, TSH normal, BNP 237, AST 38, ALT 32 Labs (8/15): LFTs normal Labs (11/15): K 4.2, creatinine 1.1, Mg 2.2, BNP 227 Labs (2/16): K 4, creatinine 1.32, BNP 261 Labs (6/18): K 3.9, creatinine 1.6 Labs (7/18): K 3.9, creatinine 1.5, BNP 242 Labs (12/18): K 3.8, creatinine 1.4, hgb 13.9, LDL 73, HDL 52 Labs (3/19): K 3.7, creatinine 1.4, LDL 76, HDL 42 Labs (6/19): K 4.1, creatinine 1.63 Labs (3/20): K 4.2, creatinine 1.29, LDL 53, HDL 47, hgb 13.8 Labs (4/20): K 4.3, creatinine 1.5  PMH: 1. CAD: 1st MI in 1988.  CABG 1996.  PCI to CFX in 2009.  LHC (11/09) SVG-dRCA patent, total occlusion RCA, patent CFX stent, atretic LIMA, serial 60 and 80% proximal LAD stenoses, EF 60% with basal inferior hypokinesis.  Myoview  in 2011 with no ischemia or infarction.  Echo (10/12) with EF 55-60%, mild LVH, mild MR.  Lexiscan Cardiolite in 2013 with no ischemia or infarction.  Echo (8/14) with EF 55-60%, moderate MR, moderate TR, PA systolic pressure 35 mmHg.  Echo (4/15) with EF 60-65%, mild focal basal septal hypertrophy, inferior basal akinesis, mild MR.  Lexiscan cardiolite (11/15) with EF 61%, fixed basal inferoseptal defect, no ischemia.  - Lexiscan Cardiolite (6/18): EF 54%, no perfusion defect.  - NSTEMI 3/20, cath showed 95% ostial/proximal LAD, 60-70% mid LAD, totally occluded SVG-LCx, totally occluded RCA, 80-90% mid SVG-RCA.  Patient had DES to ostial-mid LAD and DES to SVG-RCA.  2. Raynauds syndrome 3. Post-polio syndrome 4. GERD with dilation of esophageal stricture in 12/12.  5. Hyperlipidemia 6. Brainstem stroke with Horner's syndrome.  7. Scoliosis. 8. H/o appendectomy 9. Herpes Zoster 10. Atrial fibrillation: DCCV to NSR in 8/14. DCCV to NSR in 1/15. Atrial fibrillation ablation 6/15 with Tikosyn loading (at [facility]).   11. PFTs (4/15) with FVC 59%, FEV1 54%, ratio 91%, DLCO 53% => moderate obstructive defect thought to be related to COPD and severe restrictive defect thought to be due to elevated left hemidiaphragm and post-polio syndrome.  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  Echo (5/18) with EF 65-70%, RV moderately dilated with normal systolic function, PASP 57 mmHg.  - Echo (3/20): EF 45-50%, mild LV dilation, basal inferolateral and inferior hypokinesis.  13. CKD: Stage 3.  14. OSA: dental appliance.  15. Right radial artery pseudoaneurysm: post-cath in 3/20.    Current Outpatient Medications  Medication Sig Dispense Refill  . acetaminophen (TYLENOL) 500 MG tablet Take 500 mg by mouth every 8 (eight) hours as needed for mild pain or moderate pain.     [name] albuterol (PROAIR HFA) 108 (90 BASE) MCG/ACT inhaler Inhale 2 puffs into the lungs every 4 (four) hours as needed for wheezing or shortness of breath (and  prior to exercise). 1 Inhaler 3  . AMBULATORY NON FORMULARY MEDICATION Apply 1 application topically daily. Testosterone cream 150mg /15% Place 15% onto the skin daily     . aspirin EC 81 MG EC tablet Take 1 tablet (81 mg total) by mouth daily. 30 tablet 12  . B Complex Vitamins (VITAMIN B COMPLEX PO) Take 1 tablet by mouth daily.    [name] BYSTOLIC 10 MG tablet TAKE 1 TABLET BY MOUTH EVERY DAY 90 tablet 1  . clopidogrel (PLAVIX) 75 MG tablet Take 1 tablet (75 mg total) by mouth daily with breakfast. 30 tablet 6  . dicyclomine (BENTYL) 10 MG capsule Take 1 capsule (10 mg total) by mouth 3 (three) times daily before meals. 90 capsule 11  . diltiazem (CARDIZEM  CD) 120 MG 24 hr capsule TAKE ONE CAPSULE BY MOUTH ONCE EACH DAY. 90 capsule 3  . dofetilide (TIKOSYN) 250 MCG capsule TAKE 1 CAPSULE BY MOUTH EVERY 12 HOURS *NEED OFFICE VISIT* 180 capsule 3  . ezetimibe (ZETIA) 10 MG tablet TAKE 1 TABLET BY MOUTH EVERY DAY 90 tablet 3  . famotidine (PEPCID) 40 MG tablet Take 1 tablet (40 mg total) by mouth at bedtime. 30 tablet 11  . furosemide (LASIX) 40 MG tablet Take 20-40 mg by mouth daily.     [name] lactobacillus acidophilus & bulgar (LACTINEX) chewable tablet Chew 2 tablets by mouth daily.     [name] loperamide (IMODIUM) 2 MG capsule Take by mouth as needed for diarrhea or loose stools.    [name] LORazepam (ATIVAN) 0.5 MG tablet Take 0.5 mg by mouth at bedtime as needed for sleep.     [name] losartan (COZAAR) 25 MG tablet Take 0.5 tablets (12.5 mg total) by mouth daily. 45 tablet 3  . methocarbamol (ROBAXIN) 500 MG tablet Take 500 mg by mouth 3 (three) times daily as needed for muscle spasms.     . nitroGLYCERIN (NITROSTAT) 0.4 MG SL tablet Place 1 tablet (0.4 mg total) under the tongue every 5 (five) minutes as needed for chest pain. 25 tablet 0  . potassium chloride SA (K-DUR,KLOR-CON) 20 MEQ tablet Take 20 mEq by mouth daily.    . Probiotic Product (ALIGN) 4 MG CAPS Take 4 mg by mouth daily.    . rosuvastatin (CRESTOR) 40  MG tablet Take 40 mg by mouth every evening.    . sodium chloride (OCEAN) 0.65 % SOLN nasal spray Place 1 spray into both nostrils as needed for congestion.    . traZODone (DESYREL) 50 MG tablet Take 50 mg by mouth at bedtime.    . Vitamin D, Ergocalciferol, (DRISDOL) 50000 UNITS CAPS capsule Take 50,000 Units by mouth every 14 (fourteen) days. Take on 1st and 15th    . XARELTO 15 MG TABS tablet TAKE 1 TABLET EVERY EVENING 90 tablet 1   No current facility-administered medications for this encounter.     Allergies:   Morphine and related and Tape   Social History:  The patient  reports that he quit smoking about 52 years ago. His smoking use included cigarettes. He has a 2.50 pack-year smoking history. He quit smokeless tobacco use about 70 years ago. He reports that he does not drink alcohol or use drugs.   Family History:  The patient's family history includes Cancer in his son; Heart Problems in his brother; Heart disease in his father, maternal grandfather, maternal grandmother, mother, paternal grandfather, and paternal grandmother; Prostate cancer in his paternal uncle.   ROS:  Please see the history of present illness.   All other systems are personally reviewed and negative.   Exam:   General: NAD Neck: No JVD, no thyromegaly or thyroid nodule.  Lungs: Clear to auscultation bilaterally with normal respiratory effort. CV: Nondisplaced PMI.  Heart regular S1/S2, no S3/S4, no murmur.  No peripheral edema.  No carotid bruit.  Normal pedal pulses.  Abdomen: Soft, nontender, no hepatosplenomegaly, no distention.  Skin: Intact without lesions or rashes.  Neurologic: Alert and oriented x 3.  Psych: Normal affect. Extremities: No clubbing or cyanosis.  Small pulsatile pseudoaneurysm at right radial artery cath site.  2+ radial pulse distal to PSA.  HEENT: Normal.     Recent Labs: 12/15/2018: Magnesium 2.4 01/04/2019: ALT 26 03/27/2019: Hemoglobin 13.8; Platelets 123 04/24/2019: BUN 13;  Creatinine, Ser 1.50; Potassium 4.3; Sodium 137  Personally reviewed   Wt Readings from Last 3 Encounters:  05/03/19 57.2 kg (126 lb)  03/27/19 54 kg (119 lb 1.6 oz)  02/19/19 54 kg (119 lb)      ASSESSMENT AND PLAN:  1. Atrial fibrillation: He has been on Tikosyn and is s/p atrial fibrillation ablation at [facility] on 06/25/14.  He has been in and out of atypical atrial flutter.  He has been followed by Dr. Curt Bears with plan for ablation once he has started on treatment for his OSA, now using oral appliance regularly.  Today, he is back in NSR.  - Continue Tikosyn.    - Continue Xarelto.  - He has been on nebivolol and diltiazem CD for rate control.   - He has an appointment with Dr. Curt Bears for evaluation for atrial flutter ablation now that he is being treated for sleep apnea.   2. CAD: H/o CABG.  He had NSTEMI in 3/20 with DES to ostial/proximal LAD and SVG-RCA.  No further chest pain.  - He has stopped ASA.   - Continue Plavix at least 6 months, ideally 1 year.  - He will continue Xarelto 15 mg daily long-term for atrial fibrillation/flutter.  - he is on statin and Zetia.  3. Hyperlipidemia: He is taking Crestor 40 mg daily and Zetia 10 mg daily. Good lipids in 3/20.     4. Ischemic cardiomyopathy/primarily diastolic CHF: Echo in 0/24 with EF 45-50%.  NYHA class II.  Weight stable.   - Continue Lasix 40 qd   - Continue nebivolol.  - Continue losartan 12.5 mg daily.   5. Pulmonary: PFTs suggested both a restrictive and obstructive defect.  The restrictive defect is likely from post-polio syndrome and elevated left hemidiaphragm.  Mr Dalgleish never smoked much, so the obstructive defect is more surprising.  6. OSA: Severe, likely potentiates his atrial fibrillation. He has had a hard time tolerating CPAP.   - Now using an oral appliance.   7. Right radial artery pseudoaneurysm: Diagnosed today with doppler US.  Good radial pulse distal to PSA.  No neuromuscular impairment of hand.  - Try to  keep PSA compressed.  - I will refer to [facility] for evaluation, hopefully next week.   COVID screen The patient does not have any symptoms that suggest any further testing/ screening at this time.  Social distancing reinforced today.  Relevant cardiac medications were reviewed at length with the patient today.   The patient does not have concerns regarding their medications at this time.   Recommended follow-up:  2 months  Today, I have spent 22 minutes with the patient with telehealth technology discussing the above issues .    Signed, [name], MD  05/04/2019  [facility] [address] [facility] [place] [address] [phone] (office) [phone] (fax)

## 2019-05-07 ENCOUNTER — Ambulatory Visit (INDEPENDENT_AMBULATORY_CARE_PROVIDER_SITE_OTHER): Payer: Medicare Other | Admitting: Surgery

## 2019-05-07 ENCOUNTER — Encounter: Payer: Self-pay | Admitting: *Deleted

## 2019-05-07 ENCOUNTER — Encounter: Payer: Self-pay | Admitting: Surgery

## 2019-05-07 ENCOUNTER — Other Ambulatory Visit: Payer: Self-pay

## 2019-05-07 ENCOUNTER — Other Ambulatory Visit: Payer: Self-pay | Admitting: *Deleted

## 2019-05-07 VITALS — BP 117/61 | HR 71 | Temp 98.4°F | Resp 20 | Ht 66.0 in | Wt 127.0 lb

## 2019-05-07 DIAGNOSIS — T81718A Complication of other artery following a procedure, not elsewhere classified, initial encounter: Secondary | ICD-10-CM | POA: Diagnosis not present

## 2019-05-07 DIAGNOSIS — I729 Aneurysm of unspecified site: Secondary | ICD-10-CM | POA: Diagnosis not present

## 2019-05-07 DIAGNOSIS — I2510 Atherosclerotic heart disease of native coronary artery without angina pectoris: Secondary | ICD-10-CM | POA: Diagnosis not present

## 2019-05-07 NOTE — Progress Notes (Signed)
 [facility]  Patient name: [name] MRN: [id number] DOB: 02/20/1936 Sex: male   REQUESTING PROVIDER:    Dr. McLean   REASON FOR CONSULT:    Radial pseudoaneurysm  HISTORY OF PRESENT ILLNESS:   [name] is a 82 y.o. 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  Patient has a history of coronary artery disease and is status post CABG.  He was admitted on 320 with a NSTEMI and underwent cardiac catheterization with DES.  This was done through a right radial artery catheterization.  He has had a knot at his catheterization site for about 3 weeks.  This has slowly increased in size.  Ultrasound shows a pseudoaneurysm.  He does not have any neurologic changes.  Patient has a history of atrial fibrillation on Xarelto.  He has been cardioverted and ablated.  He is also treated for chronic diastolic heart failure.  He has post polio syndrome.  He is on a statin for hypercholesterolemia.  He has a history of brainstem stroke with subsequent Horner syndrome.  He has CKD stage III  PAST MEDICAL HISTORY    Past Medical History:  Diagnosis Date  . Abnormal nuclear cardiac imaging test March 2011   Has positive EKG response, no perfusion defect and normal EF  . Adenomatous colon polyp 12/2002  . Atrial fibrillation (HCC)    a. s/p rfca;  b. chronic tikosyn and xarelto.  . Brainstem stroke (HCC) 1996   with residual horner syndrome  . Cataract    bil cataracts removed  . Chronic diastolic CHF (congestive heart failure) (HCC)    a. 04/2017 Echo: EF 65-70%, restrictive physiology, mildly dil LA.  . COPD (chronic obstructive pulmonary disease) (HCC)   . Coronary artery disease    First MI in 1988. PCI in 1996 with subsequent CABG in 1996 due to restenosis. S/P stents to LCX in 2009. Noted to have residual disease in the LAD in a diffuse manner and atretic LIMA graft. He is managed  medically.   . Esophageal stricture   . GERD (gastroesophageal reflux disease)   . 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 History of Raynaud's syndrome   . Hyperlipidemia   . Hypertension   . Internal hemorrhoids   . Myocardial infarction (HCC)    MI x1 1989 - 52 age  . Neuromuscular disorder (HCC)    Polio - age 7  . OSA (obstructive sleep apnea) 10/16/2018   Severe OSA with AHI 37.2/hr on BiPAP  . Persistent atrial fibrillation   . Polio    age 6  . PVC (premature ventricular contraction)   . Scoliosis    mild     FAMILY HISTORY   Family History  Problem Relation Age of Onset  . Heart disease Mother   . Heart disease Father   . Heart Problems Brother        heart transplant  . Heart disease Maternal Grandmother   . Heart disease Maternal Grandfather   . Heart disease Paternal Grandmother   . Heart disease Paternal Grandfather   . Prostate cancer Paternal Uncle   . Cancer Son        thymic cancer.  died at age 48  . Colon cancer Neg Hx   . Esophageal cancer Neg Hx   . Pancreatic cancer Neg Hx   . Rectal cancer Neg Hx   . Stomach cancer Neg Hx       SOCIAL HISTORY:   Social History   Socioeconomic History  . Marital status: Married    Spouse name: Not on file  . Number of children: 2  . Years of education: Not on file  . Highest education level: Not on file  Occupational History  . Occupation: Pharmacist  Social Needs  . Financial resource strain: Not on file  . Food insecurity:    Worry: Not on file    Inability: Not on file  . Transportation needs:    Medical: Not on file    Non-medical: Not on file  Tobacco Use  . Smoking status: Former Smoker    Packs/day: 0.50    Years: 5.00    Pack years: 2.50    Types: Cigarettes    Last attempt to quit: 12/27/1966    Years since quitting: 52.3  . Smokeless tobacco: Former User    Quit date: 1950  Substance and Sexual Activity  . Alcohol use: No  . Drug use: No  . Sexual activity: Yes   Lifestyle  . Physical activity:    Days per week: Not on file    Minutes per session: Not on file  . Stress: Not on file  Relationships  . Social connections:    Talks on phone: Not on file    Gets together: Not on file    Attends religious service: Not on file    Active member of club or organization: Not on file    Attends meetings of clubs or organizations: Not on file    Relationship status: Not on file  . Intimate partner violence:    Fear of current or ex partner: Not on file    Emotionally abused: Not on file    Physically abused: Not on file    Forced sexual activity: Not on file  Other Topics Concern  . 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 Morphine And Related Other (See Comments)    IV forms- vein irritation  . Tape     Skin Irritation    CURRENT MEDICATIONS:    Current Outpatient Medications  Medication Sig Dispense Refill  . acetaminophen (TYLENOL) 500 MG tablet Take 500 mg by mouth every 8 (eight) hours as needed for mild pain or moderate pain.     . albuterol (PROAIR HFA) 108 (90 BASE) MCG/ACT inhaler Inhale 2 puffs into the lungs every 4 (four) hours as needed for wheezing or shortness of breath (and prior to exercise). 1 Inhaler 3  . AMBULATORY NON FORMULARY MEDICATION Apply 1 application topically daily. Testosterone cream 150mg/15% Place 15% onto the skin daily     . aspirin EC 81 MG EC tablet Take 1 tablet (81 mg total) by mouth daily. 30 tablet 12  . B Complex Vitamins (VITAMIN B COMPLEX PO) Take 1 tablet by mouth daily.    . BYSTOLIC 10 MG tablet TAKE 1 TABLET BY MOUTH EVERY DAY 90 tablet 1  . clopidogrel (PLAVIX) 75 MG tablet Take 1 tablet (75 mg total) by mouth daily with breakfast. 30 tablet 6  . dicyclomine (BENTYL) 10 MG capsule Take 1 capsule (10 mg total) by mouth 3 (three) times daily before meals. 90  capsule 11  . diltiazem (CARDIZEM CD) 120 MG 24 hr capsule TAKE ONE CAPSULE BY MOUTH ONCE EACH DAY. 90 capsule   3  . dofetilide (TIKOSYN) 250 MCG capsule TAKE 1 CAPSULE BY MOUTH EVERY 12 HOURS *NEED OFFICE VISIT* 180 capsule 3  . ezetimibe (ZETIA) 10 MG tablet TAKE 1 TABLET BY MOUTH EVERY DAY 90 tablet 3  . famotidine (PEPCID) 40 MG tablet Take 1 tablet (40 mg total) by mouth at bedtime. 30 tablet 11  . furosemide (LASIX) 40 MG tablet Take 20-40 mg by mouth daily.     . lactobacillus acidophilus & bulgar (LACTINEX) chewable tablet Chew 2 tablets by mouth daily.     . loperamide (IMODIUM) 2 MG capsule Take by mouth as needed for diarrhea or loose stools.    . LORazepam (ATIVAN) 0.5 MG tablet Take 0.5 mg by mouth at bedtime as needed for sleep.     . losartan (COZAAR) 25 MG tablet Take 0.5 tablets (12.5 mg total) by mouth daily. 45 tablet 3  . methocarbamol (ROBAXIN) 500 MG tablet Take 500 mg by mouth 3 (three) times daily as needed for muscle spasms.     . nitroGLYCERIN (NITROSTAT) 0.4 MG SL tablet Place 1 tablet (0.4 mg total) under the tongue every 5 (five) minutes as needed for chest pain. 25 tablet 0  . potassium chloride SA (K-DUR,KLOR-CON) 20 MEQ tablet Take 20 mEq by mouth daily.    . Probiotic Product (ALIGN) 4 MG CAPS Take 4 mg by mouth daily.    . rosuvastatin (CRESTOR) 40 MG tablet Take 40 mg by mouth every evening.    . sodium chloride (OCEAN) 0.65 % SOLN nasal spray Place 1 spray into both nostrils as needed for congestion.    . traZODone (DESYREL) 50 MG tablet Take 50 mg by mouth at bedtime.    . Vitamin D, Ergocalciferol, (DRISDOL) 50000 UNITS CAPS capsule Take 50,000 Units by mouth every 14 (fourteen) days. Take on 1st and 15th    . XARELTO 15 MG TABS tablet TAKE 1 TABLET EVERY EVENING 90 tablet 1   No current facility-administered medications for this visit.     REVIEW OF SYSTEMS:   [X] denotes positive finding, [ ] denotes negative finding Cardiac  Comments:  Chest pain or  chest pressure: x   Shortness of breath upon exertion: x   Short of breath when lying flat:    Irregular heart rhythm: x       Vascular    Pain in calf, thigh, or hip brought on by ambulation:    Pain in feet at night that wakes you up from your sleep:     Blood clot in your veins:    Leg swelling:         Pulmonary    Oxygen at home:    Productive cough:     Wheezing:         Neurologic    Sudden weakness in arms or legs:     Sudden numbness in arms or legs:     Sudden onset of difficulty speaking or slurred speech:    Temporary loss of vision in one eye:     Problems with dizziness:         Gastrointestinal    Blood in stool:      Vomited blood:         Genitourinary    Burning when urinating:     Blood in urine:        Psychiatric    Major depression:         Hematologic    Bleeding problems:    Problems   with blood clotting too easily:        Skin    Rashes or ulcers:        Constitutional    Fever or chills:     PHYSICAL EXAM:   There were no vitals filed for this visit.  GENERAL: The patient is a well-nourished male, in no acute distress. The vital signs are documented above. CARDIAC: There is a regular rate and rhythm.  VASCULAR: Pulsatile right radial artery mass.  Palpable distal pulse PULMONARY: Nonlabored respirations MUSCULOSKELETAL: There are no major deformities or cyanosis. NEUROLOGIC: No neurologic symptoms in the hand SKIN: There are no ulcers or rashes noted. PSYCHIATRIC: The patient has a normal affect.  STUDIES:   I have reviewed the duplex with the following findings: Right: Pseudoaneurysm is visualized off the distal radial artery at        the wrist. Pseudo measures 0.79 x 0.84 cm. The neck measures        0.18 cm wide by 0.11 cm long.  ASSESSMENT and PLAN   Right radial artery pseudoaneurysm: I discussed with the patient I do not think that this will improve without intervention.  I do not think he is a good candidate for  injection therefore surgical repair has been recommended.  I discussed the risks of the procedure including bleeding, particularly since he will remain on his Plavix.  I did discuss with Dr. McLean regarding stopping his Xarelto which we will do today.  I also discussed the possibility of radial artery thrombosis.  I am scheduling his procedure for this Wednesday, May 13.  This will be an outpatient procedure done under [facility]   Wells Brabham, IV, MD, FACS [facility] Tel [phone] Pager [phone] 

## 2019-05-07 NOTE — H&P (View-Only) (Signed)
[facility] [place]  Patient name: [name] MRN: [id number] DOB: [birth year] Sex: male   REQUESTING PROVIDER:    [name]   REASON FOR CONSULT:    Radial pseudoaneurysm  HISTORY OF PRESENT ILLNESS:   [name] is a 83 y.o. 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  Patient has a history of coronary artery disease and is status post CABG.  He was admitted on 320 with a NSTEMI and underwent cardiac catheterization with DES.  This was done through a right radial artery catheterization.  He has had a knot at his catheterization site for about 3 weeks.  This has slowly increased in size.  Ultrasound shows a pseudoaneurysm.  He does not have any neurologic changes.  Patient has a history of atrial fibrillation on Xarelto.  He has been cardioverted and ablated.  He is also treated for chronic diastolic heart failure.  He has post polio syndrome.  He is on a statin for hypercholesterolemia.  He has a history of brainstem stroke with subsequent Horner syndrome.  He has CKD stage III  PAST MEDICAL HISTORY    Past Medical History:  Diagnosis Date  . Abnormal nuclear cardiac imaging test March 2011   Has positive EKG response, no perfusion defect and normal EF  . Adenomatous colon polyp 12/2002  . Atrial fibrillation ([place])    a. s/p rfca;  b. chronic tikosyn and xarelto.  . Brainstem stroke ([place]) 1996   with residual horner syndrome  . Cataract    bil cataracts removed  . Chronic diastolic CHF (congestive heart failure) ([place])    a. 04/2017 Echo: EF 65-70%, restrictive physiology, mildly dil LA.  [name] COPD (chronic obstructive pulmonary disease) ([place])   . Coronary artery disease    First MI in 1988. PCI in 1996 with subsequent CABG in 1996 due to restenosis. S/P stents to LCX in 2009. Noted to have residual disease in the LAD in a diffuse manner and atretic LIMA graft. He is managed  medically.   . Esophageal stricture   . GERD (gastroesophageal reflux disease)   . 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 History of Raynaud's syndrome   . Hyperlipidemia   . Hypertension   . Internal hemorrhoids   . Myocardial infarction [facility])    MI x1 22 - 32 age  . Neuromuscular disorder ([place])    Polio - age 55  . OSA (obstructive sleep apnea) 10/16/2018   Severe OSA with AHI 37.2/hr on BiPAP  . Persistent atrial fibrillation   . Polio    age 63  . PVC (premature ventricular contraction)   . Scoliosis    mild     FAMILY HISTORY   Family History  Problem Relation Age of Onset  . Heart disease Mother   . Heart disease Father   . Heart Problems Brother        heart transplant  . Heart disease Maternal Grandmother   . Heart disease Maternal Grandfather   . Heart disease Paternal Grandmother   . Heart disease Paternal Grandfather   . Prostate cancer Paternal Uncle   . Cancer Son        thymic cancer.  died at age 9  . Colon cancer Neg Hx   . Esophageal cancer Neg Hx   . Pancreatic cancer Neg Hx   . Rectal cancer Neg Hx   . Stomach cancer Neg Hx  SOCIAL HISTORY:   Social History   Socioeconomic History  . Marital status: Married    Spouse name: Not on file  . Number of children: 2  . Years of education: Not on file  . Highest education level: Not on file  Occupational History  . Occupation: Software engineer  Social Needs  . Financial resource strain: Not on file  . Food insecurity:    Worry: Not on file    Inability: Not on file  . Transportation needs:    Medical: Not on file    Non-medical: Not on file  Tobacco Use  . Smoking status: Former Smoker    Packs/day: 0.50    Years: 5.00    Pack years: 2.50    Types: Cigarettes    Last attempt to quit: 12/27/1966    Years since quitting: 52.3  . Smokeless tobacco: Former Systems developer    Quit date: 1950  Substance and Sexual Activity  . Alcohol use: No  . Drug use: No  . Sexual activity: Yes   Lifestyle  . Physical activity:    Days per week: Not on file    Minutes per session: Not on file  . Stress: Not on file  Relationships  . Social connections:    Talks on phone: Not on file    Gets together: Not on file    Attends religious service: Not on file    Active member of club or organization: Not on file    Attends meetings of clubs or organizations: Not on file    Relationship status: Not on file  . Intimate partner violence:    Fear of current or ex partner: Not on file    Emotionally abused: Not on file    Physically abused: Not on file    Forced sexual activity: Not on file  Other Topics Concern  . 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 Morphine And Related Other (See Comments)    IV forms- vein irritation  . Tape     Skin Irritation    CURRENT MEDICATIONS:    Current Outpatient Medications  Medication Sig Dispense Refill  . acetaminophen (TYLENOL) 500 MG tablet Take 500 mg by mouth every 8 (eight) hours as needed for mild pain or moderate pain.     [name] albuterol (PROAIR HFA) 108 (90 BASE) MCG/ACT inhaler Inhale 2 puffs into the lungs every 4 (four) hours as needed for wheezing or shortness of breath (and prior to exercise). 1 Inhaler 3  . AMBULATORY NON FORMULARY MEDICATION Apply 1 application topically daily. Testosterone cream 152m/15% Place 15% onto the skin daily     . aspirin EC 81 MG EC tablet Take 1 tablet (81 mg total) by mouth daily. 30 tablet 12  . B Complex Vitamins (VITAMIN B COMPLEX PO) Take 1 tablet by mouth daily.    .Marland KitchenBYSTOLIC 10 MG tablet TAKE 1 TABLET BY MOUTH EVERY DAY 90 tablet 1  . clopidogrel (PLAVIX) 75 MG tablet Take 1 tablet (75 mg total) by mouth daily with breakfast. 30 tablet 6  . dicyclomine (BENTYL) 10 MG capsule Take 1 capsule (10 mg total) by mouth 3 (three) times daily before meals. 90  capsule 11  . diltiazem (CARDIZEM CD) 120 MG 24 hr capsule TAKE ONE CAPSULE BY MOUTH ONCE EACH DAY. 90 capsule  3  . dofetilide (TIKOSYN) 250 MCG capsule TAKE 1 CAPSULE BY MOUTH EVERY 12 HOURS *NEED OFFICE VISIT* 180 capsule 3  . ezetimibe (ZETIA) 10 MG tablet TAKE 1 TABLET BY MOUTH EVERY DAY 90 tablet 3  . famotidine (PEPCID) 40 MG tablet Take 1 tablet (40 mg total) by mouth at bedtime. 30 tablet 11  . furosemide (LASIX) 40 MG tablet Take 20-40 mg by mouth daily.     [name] lactobacillus acidophilus & bulgar (LACTINEX) chewable tablet Chew 2 tablets by mouth daily.     [name] loperamide (IMODIUM) 2 MG capsule Take by mouth as needed for diarrhea or loose stools.    [name] LORazepam (ATIVAN) 0.5 MG tablet Take 0.5 mg by mouth at bedtime as needed for sleep.     [name] losartan (COZAAR) 25 MG tablet Take 0.5 tablets (12.5 mg total) by mouth daily. 45 tablet 3  . methocarbamol (ROBAXIN) 500 MG tablet Take 500 mg by mouth 3 (three) times daily as needed for muscle spasms.     . nitroGLYCERIN (NITROSTAT) 0.4 MG SL tablet Place 1 tablet (0.4 mg total) under the tongue every 5 (five) minutes as needed for chest pain. 25 tablet 0  . potassium chloride SA (K-DUR,KLOR-CON) 20 MEQ tablet Take 20 mEq by mouth daily.    . Probiotic Product (ALIGN) 4 MG CAPS Take 4 mg by mouth daily.    . rosuvastatin (CRESTOR) 40 MG tablet Take 40 mg by mouth every evening.    . sodium chloride (OCEAN) 0.65 % SOLN nasal spray Place 1 spray into both nostrils as needed for congestion.    . traZODone (DESYREL) 50 MG tablet Take 50 mg by mouth at bedtime.    . Vitamin D, Ergocalciferol, (DRISDOL) 50000 UNITS CAPS capsule Take 50,000 Units by mouth every 14 (fourteen) days. Take on 1st and 15th    . XARELTO 15 MG TABS tablet TAKE 1 TABLET EVERY EVENING 90 tablet 1   No current facility-administered medications for this visit.     REVIEW OF SYSTEMS:   _0  denotes positive finding, _1  denotes negative finding Cardiac  Comments:  Chest pain or  chest pressure: x   Shortness of breath upon exertion: x   Short of breath when lying flat:    Irregular heart rhythm: x       Vascular    Pain in calf, thigh, or hip brought on by ambulation:    Pain in feet at night that wakes you up from your sleep:     Blood clot in your veins:    Leg swelling:         Pulmonary    Oxygen at home:    Productive cough:     Wheezing:         Neurologic    Sudden weakness in arms or legs:     Sudden numbness in arms or legs:     Sudden onset of difficulty speaking or slurred speech:    Temporary loss of vision in one eye:     Problems with dizziness:         Gastrointestinal    Blood in stool:      Vomited blood:         Genitourinary    Burning when urinating:     Blood in urine:        Psychiatric    Major depression:         Hematologic    Bleeding problems:    Problems  with blood clotting too easily:        Skin    Rashes or ulcers:        Constitutional    Fever or chills:     PHYSICAL EXAM:   There were no vitals filed for this visit.  GENERAL: The patient is a well-nourished male, in no acute distress. The vital signs are documented above. CARDIAC: There is a regular rate and rhythm.  VASCULAR: Pulsatile right radial artery mass.  Palpable distal pulse PULMONARY: Nonlabored respirations MUSCULOSKELETAL: There are no major deformities or cyanosis. NEUROLOGIC: No neurologic symptoms in the hand SKIN: There are no ulcers or rashes noted. PSYCHIATRIC: The patient has a normal affect.  STUDIES:   I have reviewed the duplex with the following findings: Right: Pseudoaneurysm is visualized off the distal radial artery at        the wrist. Pseudo measures 0.79 x 0.84 cm. The neck measures        0.18 cm wide by 0.11 cm long.  ASSESSMENT and PLAN   Right radial artery pseudoaneurysm: I discussed with the patient I do not think that this will improve without intervention.  I do not think he is a good candidate for  injection therefore surgical repair has been recommended.  I discussed the risks of the procedure including bleeding, particularly since he will remain on his Plavix.  I did discuss with [name] regarding stopping his Xarelto which we will do today.  I also discussed the possibility of radial artery thrombosis.  I am scheduling his procedure for this Wednesday, May 13.  This will be an outpatient procedure done under MAC   Leia Alf, MD, FACS [facility] Specialists of [facility] [phone] Pager [phone]

## 2019-05-08 ENCOUNTER — Other Ambulatory Visit (HOSPITAL_COMMUNITY)
Admission: RE | Admit: 2019-05-08 | Discharge: 2019-05-08 | Disposition: A | Discharge status: Home / Self Care | Payer: Medicare Other | Source: Ambulatory Visit | Attending: Surgery | Admitting: Surgery

## 2019-05-08 ENCOUNTER — Other Ambulatory Visit: Payer: Self-pay

## 2019-05-08 ENCOUNTER — Encounter (HOSPITAL_COMMUNITY): Payer: Self-pay | Admitting: *Deleted

## 2019-05-08 DIAGNOSIS — Z1159 Encounter for screening for other viral diseases: Secondary | ICD-10-CM | POA: Diagnosis not present

## 2019-05-08 LAB — SARS CORONAVIRUS 2 BY RT PCR (HOSPITAL ORDER, PERFORMED IN ~~LOC~~ HOSPITAL LAB): SARS Coronavirus 2: NEGATIVE

## 2019-05-08 NOTE — Progress Notes (Signed)
Anesthesia Chart Review: SAME DAY WORKUP   Case:  [id number] Date/Time:  05/09/19 1056   Procedure:  REPAIR FALSE ANEURYSM RIGHT RADIAL (Right )   Anesthesia type:  Monitor Anesthesia Care   Pre-op diagnosis:  RIGHT RADIAL PSEUDOANEURYMS REPAIR   Location:  [facility] OR ROOM 10 / [place] OR   Surgeon:  Serafina Mitchell, MD      DISCUSSION: 83 yo male former smoker. 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  He was admitted with NSTEMI in 3/20,cath showed 80-90% stenosis mid SVG-RCA and 95% proximal LAD stenosis with 60-70% mid LAD stenosis at take-off of large septal perforator (which had ostial 60% stenosis). Patient is now s/p DES to prox LAD and DES x 2 to mid RCA.  Echo showed EF 45-50%.   Seen by cardiology in followup on 05/03/19. No dyspnea walking up stairs or walking on flat ground.  No chest pain.  He was in NSR at that visit. He has been using his oral appliance for sleep apnea (can't tolerate CPAP). He was noted to have a pseudoaneurysm of the right radial artery and was referred to [facility].  PFTs (2015) with FVC 59%, FEV1 54%, ratio 91%, DLCO 53% => moderate obstructive defect thought to be related to COPD and severe restrictive defect thought to be due to elevated left hemidiaphragm and post-polio syndrome.   Anticipate he can proceed as planned barring acute status change.  VS: There were no vitals taken for this visit.  PROVIDERS: Burnard Bunting, MD is PCP  [name], MD is Cardiologist   LABS: Will need DOS labs.  Labs Reviewed - No data to display   IMAGES: PORTABLE CHEST 1 VIEW 03/25/2019  COMPARISON:  Radiographs of October 03, 2017.  FINDINGS: Stable cardiomediastinal silhouette. Status post coronary bypass graft. No pneumothorax or pleural effusion is noted. Stable right apical scarring is noted. No acute pulmonary disease is noted. Stable elevated left hemidiaphragm is noted. Bony thorax is unremarkable.  IMPRESSION: No acute cardiopulmonary abnormality seen.   EKG: 05/03/19. Sinus bradycardia with Premature supraventricular complexes. Rate 50. Nonspecific ST and T wave abnormality  CV: TTE 03/26/19:  1. The left ventricle has mildly reduced systolic function, with an ejection fraction of 45-50%. The cavity size was mildly dilated. Left ventricular diastolic Doppler parameters are indeterminate.  2. Posterior lateral and inferior basal hypokinesis.  3. The right ventricle has normal systolic function. The cavity was normal. There is no increase in right ventricular wall thickness.  4. Left atrial size was mildly dilated.  5. Right atrial size was mildly dilated.  6. The mitral valve is degenerative. Moderate thickening of the mitral valve leaflet. Mild calcification of the mitral valve leaflet. There is mild mitral annular calcification present.  7. Tricuspid valve regurgitation is mild-moderate.  8. The aortic valve is tricuspid. Moderate thickening of the aortic valve. Moderate sclerosis of the aortic valve.  Cath and PCI 03/26/19:  Ost LAD to Mid LAD lesion is 80% stenosed.  A drug-eluting stent was successfully placed using a STENT SYNERGY DES 2.75X38, postdilated to 3 mm.  Post intervention, there is a 0% residual stenosis.  Mid RCA lesion is 100% stenosed. SVG to RCA with  80% lesion.  A drug-eluting stent was successfully placed using a STENT SYNERGY DES 3X16. At the proximal edge of the stent, An overlapping drug-eluting stent was successfully placed using a STENT SYNERGY DES 2.75X8.  Post intervention, there is a 0% residual stenosis.   Continue aggressive secondary prevention.    We  will add back IV heparin 8 hours post sheath pull.  Restart Xarelto tomorrow.  Synergy drug-eluting stents placed.  Plan for triple therapy with aspirin, Plavix and Xarelto for 30 days.  Drop aspirin after 30 days.  Ideally, would continue clopidogrel for 6 months if no bleeding issues.  Clopidogrel could be stopped after 1 month if significant life-threatening bleeding noted.  He will continue Xarelto indefinitely.    Past Medical History:  Diagnosis Date  . Abnormal nuclear cardiac imaging test March 2011   Has positive EKG response, no perfusion defect and normal EF  . Adenomatous colon polyp 12/2002  . Atrial fibrillation ([place])    a. s/p rfca;  b. chronic tikosyn and xarelto.  . Brainstem stroke ([place]) 1996   with residual horner syndrome  . Cataract    bil cataracts removed  . Chronic diastolic CHF (congestive heart failure) ([place])    a. 04/2017 Echo: EF 65-70%, restrictive physiology, mildly dil LA.  [name] COPD (chronic obstructive pulmonary disease) ([place])   . Coronary artery disease    First MI in 1988. PCI in 1996 with subsequent CABG in 1996 due to restenosis. S/P stents to LCX in 2009. Noted to have residual disease in the LAD in a diffuse manner and atretic LIMA graft. He is managed medically.   . Esophageal stricture   . GERD (gastroesophageal reflux disease)   . 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 History of Raynaud's syndrome   . Hyperlipidemia   . Hypertension   . Internal hemorrhoids   . Myocardial infarction [facility])    MI x1 44 - 89 age  . Neuromuscular disorder ([place])    Polio - age 23  . OSA (obstructive sleep apnea) 10/16/2018   Severe OSA with AHI 37.2/hr on BiPAP  . Persistent atrial fibrillation   . Polio    age 64  . PVC (premature ventricular contraction)   . Scoliosis    mild    Past Surgical History:  Procedure Laterality Date  . ABLATION     done for a fib  . APPENDECTOMY     Age 66  . CARDIOVERSION N/A 08/22/2013   Procedure:  CARDIOVERSION;  Surgeon: Carlena Bjornstad, MD;  Location: [facility] ENDOSCOPY;  Service: Cardiovascular;  Laterality: N/A;  . CARDIOVERSION N/A 01/16/2014   Procedure: CARDIOVERSION;  Surgeon: Lelon Perla, MD;  Location: [facility] ENDOSCOPY;  Service: Cardiovascular;  Laterality: N/A;  . CARDIOVERSION N/A 12/11/2018   Procedure: CARDIOVERSION;  Surgeon: Larey Dresser, MD;  Location: [facility] ENDOSCOPY;  Service: Cardiovascular;  Laterality: N/A;  . COLONOSCOPY  2008  . CORONARY ARTERY BYPASS GRAFT  1996   with a LIMA to the LAD, SVG to RCA and SVG to OM  . CORONARY STENT INTERVENTION N/A 03/26/2019   Procedure: CORONARY STENT INTERVENTION;  Surgeon: Jettie Booze, MD;  Location: [place] CV LAB;  Service: Cardiovascular;  Laterality: N/A;  . CORONARY STENT PLACEMENT  2009    LCX  . LEFT HEART CATH AND CORS/GRAFTS ANGIOGRAPHY N/A 03/26/2019   Procedure: LEFT HEART CATH AND CORS/GRAFTS ANGIOGRAPHY;  Surgeon: Larey Dresser, MD;  Location: [place] CV LAB;  Service: Cardiovascular;  Laterality: N/A;  . POLYPECTOMY    . TONSILLECTOMY    . UPPER GASTROINTESTINAL ENDOSCOPY      MEDICATIONS: No current facility-administered medications for this encounter.    [name] acetaminophen (TYLENOL) 500 MG tablet  . albuterol (PROAIR HFA) 108 (90 BASE) MCG/ACT inhaler  . AMBULATORY NON FORMULARY MEDICATION  . B Complex Vitamins (VITAMIN B COMPLEX PO)  . BYSTOLIC 10 MG tablet  . clopidogrel (PLAVIX) 75 MG tablet  . dicyclomine (BENTYL) 10 MG capsule  . diltiazem (CARDIZEM CD) 120 MG 24 hr capsule  . dofetilide (TIKOSYN) 250 MCG capsule  . ezetimibe (ZETIA) 10 MG tablet  . famotidine (PEPCID) 40 MG tablet  . furosemide (LASIX) 40 MG tablet  . lactobacillus acidophilus & bulgar (LACTINEX) chewable tablet  . loperamide (IMODIUM) 2 MG capsule  . LORazepam (ATIVAN) 0.5 MG tablet  . losartan (COZAAR) 25 MG tablet  . methocarbamol (ROBAXIN) 500 MG tablet  . nitroGLYCERIN (NITROSTAT) 0.4 MG SL tablet  .  potassium chloride SA (K-DUR,KLOR-CON) 20 MEQ tablet  . Probiotic Product (ALIGN) 4 MG CAPS  . rosuvastatin (CRESTOR) 40 MG tablet  . sodium chloride (OCEAN) 0.65 % SOLN nasal spray  . traZODone (DESYREL) 50 MG tablet  . Vitamin D, Ergocalciferol, (DRISDOL) 50000 UNITS CAPS capsule  . XARELTO 15 MG TABS tablet     Wynonia Musty [facility] Short Stay [facility]/Anesthesiology Phone [phone] 05/08/2019 10:37 AM

## 2019-05-08 NOTE — Progress Notes (Signed)
[name] denies chest pain or shortness of breath.  Patient had a COVID test today- it was negative.  [name] has been home with his wife since test , and plans to stay there with no visitors.

## 2019-05-08 NOTE — Anesthesia Preprocedure Evaluation (Addendum)
Anesthesia Evaluation  Patient identified by MRN, date of birth, ID band Patient awake    Reviewed: Allergy & Precautions, NPO status , Patient's Chart, lab work & pertinent test results  History of Anesthesia Complications (+) DIFFICULT AIRWAY  Airway Mallampati: II  TM Distance: >3 FB Neck ROM: Full    Dental  (+) Chipped, Dental Advisory Given   Pulmonary sleep apnea (doesn't tolerate BiPAP) , COPD,  COPD inhaler, former smoker (quit 1968),    breath sounds clear to auscultation       Cardiovascular hypertension, Pt. on medications and Pt. on home beta blockers (-) angina+ CAD, + Past MI (02/2019 NSTEMI), + Cardiac Stents and + CABG  + dysrhythmias Atrial Fibrillation  Rhythm:Irregular Rate:Normal  02/2019 ECHO: EF 45-50%. LV cavity mildly dilated. Posterior lateral and inferior basal hypokinesis, mild MR, mild-mod TR   Neuro/Psych CVA (difficulty swallowing, Horner's), Residual Symptoms    GI/Hepatic Neg liver ROS, GERD  Medicated and Controlled,  Endo/Other  raynaud's  Renal/GU Renal InsufficiencyRenal disease (creat 1.62)     Musculoskeletal   Abdominal   Peds  Hematology xarelto   Anesthesia Other Findings   Reproductive/Obstetrics                           Anesthesia Physical Anesthesia Plan  ASA: III  Anesthesia Plan: MAC   Post-op Pain Management:    Induction:   PONV Risk Score and Plan: 1 and Ondansetron and Treatment may vary due to age or medical condition  Airway Management Planned: Natural Airway and Simple Face Mask  Additional Equipment:   Intra-op Plan:   Post-operative Plan:   Informed Consent: I have reviewed the patients History and Physical, chart, labs and discussed the procedure including the risks, benefits and alternatives for the proposed anesthesia with the patient or authorized representative who has indicated his/her understanding and acceptance.      Dental advisory given  Plan Discussed with: CRNA and Surgeon  Anesthesia Plan Comments: (See PAT note by Karoline Caldwell, PA-C)      Anesthesia Quick Evaluation

## 2019-05-09 ENCOUNTER — Ambulatory Visit (HOSPITAL_COMMUNITY)
Admission: RE | Admit: 2019-05-09 | Discharge: 2019-05-09 | Disposition: A | Discharge status: Home / Self Care | Payer: Medicare Other | Attending: Surgery | Admitting: Surgery

## 2019-05-09 ENCOUNTER — Other Ambulatory Visit: Payer: Self-pay

## 2019-05-09 ENCOUNTER — Encounter (HOSPITAL_COMMUNITY): Payer: Self-pay

## 2019-05-09 ENCOUNTER — Ambulatory Visit (HOSPITAL_COMMUNITY): Payer: Medicare Other | Admitting: Anesthesiology

## 2019-05-09 ENCOUNTER — Encounter (HOSPITAL_COMMUNITY)
Admission: RE | Disposition: A | Discharge status: Home / Self Care | Payer: Self-pay | Source: Home / Self Care | Attending: Surgery

## 2019-05-09 DIAGNOSIS — Y832 Surgical operation with anastomosis, bypass or graft as the cause of abnormal reaction of the patient, or of later complication, without mention of misadventure at the time of the procedure: Secondary | ICD-10-CM | POA: Diagnosis not present

## 2019-05-09 DIAGNOSIS — J449 Chronic obstructive pulmonary disease, unspecified: Secondary | ICD-10-CM | POA: Diagnosis not present

## 2019-05-09 DIAGNOSIS — T81718A Complication of other artery following a procedure, not elsewhere classified, initial encounter: Secondary | ICD-10-CM | POA: Diagnosis not present

## 2019-05-09 DIAGNOSIS — Y838 Other surgical procedures as the cause of abnormal reaction of the patient, or of later complication, without mention of misadventure at the time of the procedure: Secondary | ICD-10-CM | POA: Insufficient documentation

## 2019-05-09 DIAGNOSIS — Z7902 Long term (current) use of antithrombotics/antiplatelets: Secondary | ICD-10-CM | POA: Diagnosis not present

## 2019-05-09 DIAGNOSIS — Z8612 Personal history of poliomyelitis: Secondary | ICD-10-CM | POA: Diagnosis not present

## 2019-05-09 DIAGNOSIS — Z79899 Other long term (current) drug therapy: Secondary | ICD-10-CM | POA: Diagnosis not present

## 2019-05-09 DIAGNOSIS — Z885 Allergy status to narcotic agent status: Secondary | ICD-10-CM | POA: Insufficient documentation

## 2019-05-09 DIAGNOSIS — I4819 Other persistent atrial fibrillation: Secondary | ICD-10-CM | POA: Diagnosis not present

## 2019-05-09 DIAGNOSIS — Z951 Presence of aortocoronary bypass graft: Secondary | ICD-10-CM | POA: Diagnosis not present

## 2019-05-09 DIAGNOSIS — G4733 Obstructive sleep apnea (adult) (pediatric): Secondary | ICD-10-CM | POA: Diagnosis not present

## 2019-05-09 DIAGNOSIS — I5032 Chronic diastolic (congestive) heart failure: Secondary | ICD-10-CM | POA: Diagnosis not present

## 2019-05-09 DIAGNOSIS — N183 Chronic kidney disease, stage 3 (moderate): Secondary | ICD-10-CM | POA: Diagnosis not present

## 2019-05-09 DIAGNOSIS — I69398 Other sequelae of cerebral infarction: Secondary | ICD-10-CM | POA: Diagnosis not present

## 2019-05-09 DIAGNOSIS — M419 Scoliosis, unspecified: Secondary | ICD-10-CM | POA: Insufficient documentation

## 2019-05-09 DIAGNOSIS — I7300 Raynaud's syndrome without gangrene: Secondary | ICD-10-CM | POA: Insufficient documentation

## 2019-05-09 DIAGNOSIS — K219 Gastro-esophageal reflux disease without esophagitis: Secondary | ICD-10-CM | POA: Diagnosis not present

## 2019-05-09 DIAGNOSIS — I252 Old myocardial infarction: Secondary | ICD-10-CM | POA: Diagnosis not present

## 2019-05-09 DIAGNOSIS — Z87891 Personal history of nicotine dependence: Secondary | ICD-10-CM | POA: Insufficient documentation

## 2019-05-09 DIAGNOSIS — Z955 Presence of coronary angioplasty implant and graft: Secondary | ICD-10-CM | POA: Diagnosis not present

## 2019-05-09 DIAGNOSIS — E7800 Pure hypercholesterolemia, unspecified: Secondary | ICD-10-CM | POA: Insufficient documentation

## 2019-05-09 DIAGNOSIS — Z888 Allergy status to other drugs, medicaments and biological substances status: Secondary | ICD-10-CM | POA: Insufficient documentation

## 2019-05-09 DIAGNOSIS — I721 Aneurysm of artery of upper extremity: Secondary | ICD-10-CM | POA: Diagnosis not present

## 2019-05-09 DIAGNOSIS — T82897A Other specified complication of cardiac prosthetic devices, implants and grafts, initial encounter: Secondary | ICD-10-CM | POA: Diagnosis present

## 2019-05-09 DIAGNOSIS — I2510 Atherosclerotic heart disease of native coronary artery without angina pectoris: Secondary | ICD-10-CM | POA: Insufficient documentation

## 2019-05-09 DIAGNOSIS — Z7901 Long term (current) use of anticoagulants: Secondary | ICD-10-CM | POA: Diagnosis not present

## 2019-05-09 DIAGNOSIS — Z7982 Long term (current) use of aspirin: Secondary | ICD-10-CM | POA: Insufficient documentation

## 2019-05-09 DIAGNOSIS — G902 Horner's syndrome: Secondary | ICD-10-CM | POA: Insufficient documentation

## 2019-05-09 DIAGNOSIS — E785 Hyperlipidemia, unspecified: Secondary | ICD-10-CM | POA: Insufficient documentation

## 2019-05-09 DIAGNOSIS — I130 Hypertensive heart and chronic kidney disease with heart failure and stage 1 through stage 4 chronic kidney disease, or unspecified chronic kidney disease: Secondary | ICD-10-CM | POA: Diagnosis not present

## 2019-05-09 HISTORY — PX: FALSE ANEURYSM REPAIR: SHX5152

## 2019-05-09 HISTORY — PX: EMBOLECTOMY: SHX44

## 2019-05-09 HISTORY — DX: Cardiac arrhythmia, unspecified: I49.9

## 2019-05-09 HISTORY — DX: Other muscle spasm: M62.838

## 2019-05-09 HISTORY — DX: Failed or difficult intubation, initial encounter: T88.4XXA

## 2019-05-09 LAB — CBC
HCT: 45.7 % (ref 39.0–52.0)
Hemoglobin: 14.7 g/dL (ref 13.0–17.0)
MCH: 32.6 pg (ref 26.0–34.0)
MCHC: 32.2 g/dL (ref 30.0–36.0)
MCV: 101.3 fL — ABNORMAL HIGH (ref 80.0–100.0)
Platelets: 161 10*3/uL (ref 150–400)
RBC: 4.51 MIL/uL (ref 4.22–5.81)
RDW: 13.6 % (ref 11.5–15.5)
WBC: 6.5 10*3/uL (ref 4.0–10.5)
nRBC: 0 % (ref 0.0–0.2)

## 2019-05-09 LAB — SURGICAL PCR SCREEN
MRSA, PCR: NEGATIVE
Staphylococcus aureus: NEGATIVE

## 2019-05-09 LAB — BASIC METABOLIC PANEL
Anion gap: 8 (ref 5–15)
BUN: 21 mg/dL (ref 8–23)
CO2: 23 mmol/L (ref 22–32)
Calcium: 9.6 mg/dL (ref 8.9–10.3)
Chloride: 104 mmol/L (ref 98–111)
Creatinine, Ser: 1.62 mg/dL — ABNORMAL HIGH (ref 0.61–1.24)
GFR calc Af Amer: 46 mL/min — ABNORMAL LOW (ref >60)
GFR calc non Af Amer: 39 mL/min — ABNORMAL LOW (ref >60)
Glucose, Bld: 95 mg/dL (ref 70–99)
Potassium: 4.4 mmol/L (ref 3.5–5.1)
Sodium: 135 mmol/L (ref 135–145)

## 2019-05-09 LAB — PROTIME-INR
INR: 1.2 (ref 0.8–1.2)
Prothrombin Time: 15.2 seconds (ref 11.4–15.2)

## 2019-05-09 SURGERY — REPAIR, PSEUDOANEURYSM
Anesthesia: Monitor Anesthesia Care | Laterality: Right

## 2019-05-09 MED ORDER — 0.9 % SODIUM CHLORIDE (POUR BTL) OPTIME
TOPICAL | Status: DC | PRN
  Administered 2019-05-09: 13:00:00 1000 mL

## 2019-05-09 MED ORDER — SODIUM CHLORIDE 0.9 % IV SOLN
INTRAVENOUS | Status: DC | PRN
  Administered 2019-05-09: 13:00:00

## 2019-05-09 MED ORDER — PROPOFOL 10 MG/ML IV BOLUS
INTRAVENOUS | Status: DC | PRN
  Administered 2019-05-09: 20 mg via INTRAVENOUS

## 2019-05-09 MED ORDER — PROPOFOL 10 MG/ML IV BOLUS
INTRAVENOUS | Status: AC
  Filled 2019-05-09: qty 20

## 2019-05-09 MED ORDER — LACTATED RINGERS IV SOLN
INTRAVENOUS | Status: DC
  Administered 2019-05-09: 10:00:00 via INTRAVENOUS

## 2019-05-09 MED ORDER — DEXAMETHASONE SODIUM PHOSPHATE 10 MG/ML IJ SOLN
INTRAMUSCULAR | Status: AC
  Filled 2019-05-09: qty 1

## 2019-05-09 MED ORDER — HYDROCODONE-ACETAMINOPHEN 5-325 MG PO TABS: 1.0000 | ORAL_TABLET | Freq: Four times a day (QID) | ORAL | 0 refills | Status: DC | PRN

## 2019-05-09 MED ORDER — PHENYLEPHRINE 40 MCG/ML (10ML) SYRINGE FOR IV PUSH (FOR BLOOD PRESSURE SUPPORT)
PREFILLED_SYRINGE | INTRAVENOUS | Status: AC
  Filled 2019-05-09: qty 10

## 2019-05-09 MED ORDER — PROTAMINE SULFATE 10 MG/ML IV SOLN
INTRAVENOUS | Status: AC
  Filled 2019-05-09: qty 5

## 2019-05-09 MED ORDER — SODIUM CHLORIDE 0.9 % IV SOLN
INTRAVENOUS | Status: DC | PRN
  Administered 2019-05-09: 45 ug/min via INTRAVENOUS
  Administered 2019-05-09: 30 ug/min via INTRAVENOUS

## 2019-05-09 MED ORDER — CHLORHEXIDINE GLUCONATE 4 % EX LIQD: 60.0000 mL | Freq: Once | CUTANEOUS | Status: DC

## 2019-05-09 MED ORDER — MUPIROCIN 2 % EX OINT
TOPICAL_OINTMENT | CUTANEOUS | Status: AC
  Administered 2019-05-09: 1 via TOPICAL
  Filled 2019-05-09: qty 22

## 2019-05-09 MED ORDER — SODIUM CHLORIDE 0.9 % IV SOLN
INTRAVENOUS | Status: AC
  Filled 2019-05-09: qty 1.2

## 2019-05-09 MED ORDER — FENTANYL CITRATE (PF) 100 MCG/2ML IJ SOLN
INTRAMUSCULAR | Status: DC | PRN
  Administered 2019-05-09 (×2): 50 ug via INTRAVENOUS

## 2019-05-09 MED ORDER — CEFAZOLIN SODIUM-DEXTROSE 2-4 GM/100ML-% IV SOLN
2.0000 g | INTRAVENOUS | Status: DC
  Filled 2019-05-09: qty 100

## 2019-05-09 MED ORDER — HEPARIN SODIUM (PORCINE) 1000 UNIT/ML IJ SOLN
INTRAMUSCULAR | Status: AC
  Filled 2019-05-09: qty 1

## 2019-05-09 MED ORDER — LIDOCAINE HCL 1 % IJ SOLN
INTRAMUSCULAR | Status: DC | PRN
  Administered 2019-05-09: 30 mL

## 2019-05-09 MED ORDER — CEFAZOLIN SODIUM-DEXTROSE 2-3 GM-%(50ML) IV SOLR
INTRAVENOUS | Status: DC | PRN
  Administered 2019-05-09: 2 g via INTRAVENOUS

## 2019-05-09 MED ORDER — ONDANSETRON HCL 4 MG/2ML IJ SOLN
INTRAMUSCULAR | Status: AC
  Filled 2019-05-09: qty 2

## 2019-05-09 MED ORDER — LIDOCAINE HCL (PF) 1 % IJ SOLN
INTRAMUSCULAR | Status: AC
  Filled 2019-05-09: qty 30

## 2019-05-09 MED ORDER — HEMOSTATIC AGENTS (NO CHARGE) OPTIME
TOPICAL | Status: DC | PRN
  Administered 2019-05-09: 1 via TOPICAL

## 2019-05-09 MED ORDER — ONDANSETRON HCL 4 MG/2ML IJ SOLN
INTRAMUSCULAR | Status: DC | PRN
  Administered 2019-05-09: 4 mg via INTRAVENOUS

## 2019-05-09 MED ORDER — PROPOFOL 500 MG/50ML IV EMUL
INTRAVENOUS | Status: DC | PRN
  Administered 2019-05-09: 75 ug/kg/min via INTRAVENOUS

## 2019-05-09 MED ORDER — FENTANYL CITRATE (PF) 250 MCG/5ML IJ SOLN
INTRAMUSCULAR | Status: AC
  Filled 2019-05-09: qty 5

## 2019-05-09 MED ORDER — SODIUM CHLORIDE 0.9 % IV SOLN: INTRAVENOUS | Status: DC

## 2019-05-09 MED ORDER — DEXAMETHASONE SODIUM PHOSPHATE 10 MG/ML IJ SOLN
INTRAMUSCULAR | Status: DC | PRN
  Administered 2019-05-09: 5 mg via INTRAVENOUS

## 2019-05-09 MED ORDER — MUPIROCIN 2 % EX OINT
1.0000 "application " | TOPICAL_OINTMENT | Freq: Once | CUTANEOUS | Status: AC
  Administered 2019-05-09: 10:00:00 1 via TOPICAL

## 2019-05-09 SURGICAL SUPPLY — 36 items
CANISTER SUCT 3000ML PPV (MISCELLANEOUS) ×3 IMPLANT
CATH EMB 3FR 40CM (CATHETERS) ×3 IMPLANT
CLIP VESOCCLUDE MED 24/CT (CLIP) ×3 IMPLANT
CLIP VESOCCLUDE SM WIDE 24/CT (CLIP) ×3 IMPLANT
COVER WAND RF STERILE (DRAPES) ×3 IMPLANT
CUBE 1HOLE (CUBE) ×3 IMPLANT
DERMABOND ADVANCED (GAUZE/BANDAGES/DRESSINGS) ×2
DERMABOND ADVANCED .7 DNX12 (GAUZE/BANDAGES/DRESSINGS) ×1 IMPLANT
DRAIN CHANNEL 15F RND FF W/TCR (WOUND CARE) IMPLANT
ELECT REM PT RETURN 9FT ADLT (ELECTROSURGICAL) ×3
ELECTRODE REM PT RTRN 9FT ADLT (ELECTROSURGICAL) ×1 IMPLANT
EVACUATOR SILICONE 100CC (DRAIN) IMPLANT
GLOVE BIOGEL PI IND STRL 7.5 (GLOVE) ×1 IMPLANT
GLOVE BIOGEL PI INDICATOR 7.5 (GLOVE) ×2
GLOVE SURG SS PI 7.5 STRL IVOR (GLOVE) ×3 IMPLANT
GOWN STRL REUS W/ TWL LRG LVL3 (GOWN DISPOSABLE) ×2 IMPLANT
GOWN STRL REUS W/ TWL XL LVL3 (GOWN DISPOSABLE) ×1 IMPLANT
GOWN STRL REUS W/TWL LRG LVL3 (GOWN DISPOSABLE) ×4
GOWN STRL REUS W/TWL XL LVL3 (GOWN DISPOSABLE) ×2
HEMOSTAT SNOW SURGICEL 2X4 (HEMOSTASIS) ×3 IMPLANT
KIT BASIN OR (CUSTOM PROCEDURE TRAY) ×3 IMPLANT
KIT TURNOVER KIT B (KITS) ×3 IMPLANT
NS IRRIG 1000ML POUR BTL (IV SOLUTION) ×6 IMPLANT
PACK PERIPHERAL VASCULAR (CUSTOM PROCEDURE TRAY) ×3 IMPLANT
PAD ARMBOARD 7.5X6 YLW CONV (MISCELLANEOUS) ×6 IMPLANT
SUT PROLENE 5 0 C 1 24 (SUTURE) ×6 IMPLANT
SUT PROLENE 6 0 BV (SUTURE) ×12 IMPLANT
SUT VIC AB 2-0 CT1 27 (SUTURE) ×4
SUT VIC AB 2-0 CT1 TAPERPNT 27 (SUTURE) ×2 IMPLANT
SUT VIC AB 3-0 SH 27 (SUTURE) ×4
SUT VIC AB 3-0 SH 27X BRD (SUTURE) ×2 IMPLANT
SUT VICRYL 4-0 PS2 18IN ABS (SUTURE) IMPLANT
SYR 5ML LL (SYRINGE) ×3 IMPLANT
TOWEL GREEN STERILE (TOWEL DISPOSABLE) ×3 IMPLANT
TRAY FOLEY MTR SLVR 16FR STAT (SET/KITS/TRAYS/PACK) ×3 IMPLANT
WATER STERILE IRR 1000ML POUR (IV SOLUTION) ×3 IMPLANT

## 2019-05-09 NOTE — Anesthesia Postprocedure Evaluation (Signed)
Anesthesia Post Note  Patient: [name]  Procedure(s) Performed: REPAIR FALSE ANEURYSM RIGHT RADIAL (Right ) Embolectomy of right radial artery (Right )     Patient location during evaluation: PACU Anesthesia Type: MAC Level of consciousness: awake and alert, patient cooperative and oriented Pain management: pain level controlled Vital Signs Assessment: post-procedure vital signs reviewed and stable Respiratory status: spontaneous breathing, nonlabored ventilation and respiratory function stable Cardiovascular status: blood pressure returned to baseline and stable Postop Assessment: no apparent nausea or vomiting Anesthetic complications: no    Last Vitals:  Vitals:   05/09/19 1356 05/09/19 1410  BP: 106/67 103/71  Pulse: 82 76  Resp: 18 (!) 21  Temp: 36.6 C   SpO2: 99% 97%    Last Pain:  Vitals:   05/09/19 1356  TempSrc:   PainSc: 0-No pain                 Rupinder Livingston,E. Providencia Hottenstein

## 2019-05-09 NOTE — Interval H&P Note (Signed)
History and Physical Interval Note:  05/09/2019 11:11 AM  [name]  has presented today for surgery, with the diagnosis of [place].  The various methods of treatment have been discussed with the patient and family. After consideration of risks, benefits and other options for treatment, the patient has consented to  Procedure(s): REPAIR FALSE ANEURYSM RIGHT RADIAL (Right) as a surgical intervention.  The patient's history has been reviewed, patient examined, no change in status, stable for surgery.  I have reviewed the patient's chart and labs.  Questions were answered to the patient's satisfaction.     Annamarie Major

## 2019-05-09 NOTE — Progress Notes (Signed)
Wedding band on, per patient it cannot be removed. Dr. Glennon Mac notified.

## 2019-05-09 NOTE — Discharge Instructions (Signed)
Incision Care, Adult °An incision is a surgical cut that is made through your skin. Most incisions are closed after surgery. Your incision may be closed with stitches (sutures), staples, skin glue, or adhesive strips. You may need to return to your health care provider to have sutures or staples removed. This may occur several days to several weeks after your surgery. The incision needs to be cared for properly to prevent infection. °How to care for your incision °Incision care ° °· Follow instructions from your health care provider about how to take care of your incision. Make sure you: °? Wash your hands with soap and water before you change the bandage (dressing). If soap and water are not available, use hand sanitizer. °? Change your dressing as told by your health care provider. °? Leave sutures, skin glue, or adhesive strips in place. These skin closures may need to stay in place for 2 weeks or longer. If adhesive strip edges start to loosen and curl up, you may trim the loose edges. Do not remove adhesive strips completely unless your health care provider tells you to do that. °· Check your incision area every day for signs of infection. Check for: °? More redness, swelling, or pain. °? More fluid or blood. °? Warmth. °? Pus or a bad smell. °· Ask your health care provider how to clean the incision. This may include: °? Using mild soap and water. °? Using a clean towel to pat the incision dry after cleaning it. °? Applying a cream or ointment. Do this only as told by your health care provider. °? Covering the incision with a clean dressing. °· Ask your health care provider when you can leave the incision uncovered. °· Do not take baths, swim, or use a hot tub until your health care provider approves. Ask your health care provider if you can take showers. You may only be allowed to take sponge baths for bathing. °Medicines °· If you were prescribed an antibiotic medicine, cream, or ointment, take or apply the  antibiotic as told by your health care provider. Do not stop taking or applying the antibiotic even if your condition improves. °· Take over-the-counter and prescription medicines only as told by your health care provider. °General instructions °· Limit movement around your incision to improve healing. °? Avoid straining, lifting, or exercise for the first month, or for as long as told by your health care provider. °? Follow instructions from your health care provider about returning to your normal activities. °? Ask your health care provider what activities are safe. °· Protect your incision from the sun when you are outside for the first 6 months, or for as long as told by your health care provider. Apply sunscreen around the scar or cover it up. °· Keep all follow-up visits as told by your health care provider. This is important. °Contact a health care provider if: °· Your have more redness, swelling, or pain around the incision. °· You have more fluid or blood coming from the incision. °· Your incision feels warm to the touch. °· You have pus or a bad smell coming from the incision. °· You have a fever or shaking chills. °· You are nauseous or you vomit. °· You are dizzy. °· Your sutures or staples come undone. °Get help right away if: °· You have a red streak coming from your incision. °· Your incision bleeds through the dressing and the bleeding does not stop with gentle pressure. °· The edges of   your incision open up and separate. °· You have severe pain. °· You have a rash. °· You are confused. °· You faint. °· You have trouble breathing and a fast heartbeat. °This information is not intended to replace advice given to you by your health care provider. Make sure you discuss any questions you have with your health care provider. °Document Released: 07/02/2005 Document Revised: 08/20/2016 Document Reviewed: 06/30/2016 °Elsevier Interactive Patient Education © 2019 Elsevier Inc. ° °

## 2019-05-09 NOTE — Op Note (Signed)
    Patient name: [name] MRN: [id number] DOB: 1936/08/08 Sex: male  05/09/2019 Pre-operative Diagnosis: Right radial artery pseudoaneurysm Post-operative diagnosis:  Same Surgeon:  Annamarie Major Assistants: Arlee Muslim Procedure:   Primary repair of right radial artery pseudoaneurysm Anesthesia: MAC Blood Loss: Minimal Specimens: None  Findings: Primary closure of lateral radial artery pseudoaneurysm  Indications: The patient has developed a expanding painful right radial artery pseudoaneurysm following cardiac catheterization.  He is here today for repair  Procedure:  The patient was identified in the holding area and taken to [place] [facility]  The patient was then placed supine on the table. MAC anesthesia was administered.  The patient was prepped and draped in the usual sterile fashion.  A time out was called and antibiotics were administered.  1% lidocaine was used for local anesthesia.  A longitudinal incision was made over the pseudoaneurysm.  Cautery and sharp dissection were used to isolate the radial artery proximal and distal to the pseudoaneurysm.  Once I had control, the Vesseloops were tightened and I opened the pseudoaneurysm with a 15 blade.  I then used Metzenbaum scissors to resect the encapsulated pseudoaneurysm until I identified the defect which was on the lateral side of the radial artery.  I passed a #3 Fogarty catheter proximal and distal in the radial artery.  There was no adherent thrombus.  There was good backbleeding as well as antegrade flow.  I then closed the arteriotomy with interrupted 6-0 Prolene suture.  The patient had brisk Doppler signals proximal and distal to the repair.  The wound was then copiously irrigated.  Hemostasis was achieved.  The incision was closed with 2 layers of 3-0 Vicryl followed by Dermabond.  There were no immediate complications.   Disposition: To PACU stable   V. Annamarie Major, M.D., [facility]  [place] Office: [phone] Pager:  [phone]

## 2019-05-09 NOTE — Anesthesia Procedure Notes (Addendum)
Procedure Name: MAC Date/Time: 05/09/2019 12:23 PM Performed by: Trinna Post., CRNA Pre-anesthesia Checklist: Patient identified, Emergency Drugs available, Suction available, Patient being monitored and Timeout performed Patient Re-evaluated:Patient Re-evaluated prior to induction Oxygen Delivery Method: Nasal cannula Preoxygenation: Pre-oxygenation with 100% oxygen Placement Confirmation: positive ETCO2

## 2019-05-09 NOTE — Transfer of Care (Signed)
Immediate Anesthesia Transfer of Care Note  Patient: [name]  Procedure(s) Performed: REPAIR FALSE ANEURYSM RIGHT RADIAL (Right ) Embolectomy of right radial artery (Right )  Patient Location: PACU  Anesthesia Type:MAC  Level of Consciousness: awake, alert  and oriented  Airway & Oxygen Therapy: Patient Spontanous Breathing and Patient connected to nasal cannula oxygen  Post-op Assessment: Report given to RN and Post -op Vital signs reviewed and stable  Post vital signs: Reviewed and stable  Last Vitals:  Vitals Value Taken Time  BP    Temp    Pulse    Resp    SpO2      Last Pain:  Vitals:   05/09/19 0916  TempSrc:   PainSc: 0-No pain         Complications: No apparent anesthesia complications

## 2019-05-10 ENCOUNTER — Encounter (HOSPITAL_COMMUNITY): Payer: Self-pay | Admitting: Surgery

## 2019-05-17 ENCOUNTER — Telehealth: Payer: Self-pay | Admitting: *Deleted

## 2019-05-17 NOTE — Telephone Encounter (Signed)

## 2019-05-22 ENCOUNTER — Telehealth (INDEPENDENT_AMBULATORY_CARE_PROVIDER_SITE_OTHER): Payer: Medicare Other | Admitting: Cardiology

## 2019-05-22 ENCOUNTER — Other Ambulatory Visit: Payer: Self-pay

## 2019-05-22 DIAGNOSIS — I4819 Other persistent atrial fibrillation: Secondary | ICD-10-CM | POA: Diagnosis not present

## 2019-05-22 NOTE — Progress Notes (Signed)
Electrophysiology TeleHealth Note   Due to national recommendations of social distancing due to COVID 19, an audio/video telehealth visit is felt to be most appropriate for this patient at this time.  See Epic message for the patient's consent to telehealth for [name].   Date:  05/22/2019   ID:  [name], DOB 27-May-1936, MRN [id number]  Location: patient's home  Provider location: [address], [place]  Evaluation Performed: Follow-up visit  PCP:  Burnard Bunting, MD  Cardiologist:  Aundra Dubin Electrophysiologist:  Dr Curt Bears  Chief Complaint:  AF  History of Present Illness:    [name] is a 83 y.o. male who presents via audio/video conferencing for a telehealth visit today.  Since last being seen in our clinic, the patient reports doing very well.  Today, he denies symptoms of palpitations, chest pain, shortness of breath,  lower extremity edema, dizziness, presyncope, or syncope.  The patient is otherwise without complaint today.  The patient denies symptoms of fevers, chills, cough, or new SOB worrisome for COVID 19.  He has a history of persistent atrial fibrillation, coronary artery disease, COPD, CVA, OSA, hypertension, hyperlipidemia.  He is currently on dofetilide.  He was found to be in an ectopic atrial rhythm with 2-1 block and was cardioverted 12/11/2018.  He is on BiPAP for his sleep apnea.  He returned to the atrial fibrillation clinic in an atypical atrial flutter.He was admitted to the [facility] 03/16/2019 with a non-STEMI.  He had a DES placed to the ostial LAD and mid SVG to RCA.  Ejection fraction 45 to 50%.  He developed a pseudoaneurysm in his wrist.  Today, denies symptoms of palpitations, chest pain, shortness of breath, orthopnea, PND, lower extremity edema, claudication, dizziness, presyncope, syncope, bleeding, or neurologic sequela. The patient is tolerating medications without difficulties.  Unfortunately, he feels that he went  back into atrial fibrillation a few weeks ago.  He has no chest pain or shortness of breath, his only symptom is fatigue.  He says that his wrist is taking a little while to heal.  Past Medical History:  Diagnosis Date  . Abnormal nuclear cardiac imaging test March 2011   Has positive EKG response, no perfusion defect and normal EF  . Adenomatous colon polyp 12/2002  . Atrial fibrillation ([place])    a. s/p rfca;  b. chronic tikosyn and xarelto.  . 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 Cataract    bil cataracts removed  . Chronic diastolic CHF (congestive heart failure) ([place])    a. 04/2017 Echo: EF 65-70%, restrictive physiology, mildly dil LA.  [name] Complication of anesthesia   . COPD (chronic obstructive pulmonary disease) ([place])   . Coronary artery disease    First MI in 1988. PCI in 1996 with subsequent CABG in 1996 due to restenosis. S/P stents to LCX in 2009. Noted to have residual disease in the LAD in a diffuse manner and atretic LIMA graft. He is managed medically.   . Difficult intubation    used Pederiatic , Esophagus small  . Dysrhythmia    Afib  . Esophageal stricture   . GERD (gastroesophageal reflux disease)   . 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 History of Raynaud's syndrome   . Hyperlipidemia   . Hypertension   . Internal hemorrhoids   . Muscle spasm   . Myocardial infarction [facility])    MI x1  87 - 64 age  . Neuromuscular disorder ([place])    Polio - age 50  . OSA (obstructive sleep apnea) 10/16/2018   Severe OSA with AHI 37.2/hr on BiPAP  . Persistent atrial fibrillation   . Polio    age 50  . PVC (premature ventricular contraction)   . Scoliosis    mild    Past Surgical History:  Procedure Laterality Date  . ABLATION     done for a fib  . APPENDECTOMY     Age 39  . CARDIOVERSION N/A 08/22/2013   Procedure: CARDIOVERSION;  Surgeon: Carlena Bjornstad, MD;  Location: [facility] ENDOSCOPY;  Service:  Cardiovascular;  Laterality: N/A;  . CARDIOVERSION N/A 01/16/2014   Procedure: CARDIOVERSION;  Surgeon: Lelon Perla, MD;  Location: [facility] ENDOSCOPY;  Service: Cardiovascular;  Laterality: N/A;  . CARDIOVERSION N/A 12/11/2018   Procedure: CARDIOVERSION;  Surgeon: Larey Dresser, MD;  Location: [facility] ENDOSCOPY;  Service: Cardiovascular;  Laterality: N/A;  . COLONOSCOPY  2008  . CORONARY ARTERY BYPASS GRAFT  1996   with a LIMA to the LAD, SVG to RCA and SVG to OM  . CORONARY STENT INTERVENTION N/A 03/26/2019   Procedure: CORONARY STENT INTERVENTION;  Surgeon: Jettie Booze, MD;  Location: [facility] CV LAB;  Service: Cardiovascular;  Laterality: N/A;  . CORONARY STENT PLACEMENT  1996   LCX  . EMBOLECTOMY Right 05/09/2019   Procedure: Embolectomy of right radial artery;  Surgeon: Serafina Mitchell, MD;  Location: [facility];  Service: Vascular;  Laterality: Right;  . ESOPHAGEAL DILATION    . EYE SURGERY    . FALSE ANEURYSM REPAIR Right 05/09/2019   Procedure: REPAIR FALSE ANEURYSM RIGHT RADIAL;  Surgeon: Serafina Mitchell, MD;  Location: [facility];  Service: Vascular;  Laterality: Right;  . LEFT HEART CATH AND CORS/GRAFTS ANGIOGRAPHY N/A 03/26/2019   Procedure: LEFT HEART CATH AND CORS/GRAFTS ANGIOGRAPHY;  Surgeon: Larey Dresser, MD;  Location: [facility] CV LAB;  Service: Cardiovascular;  Laterality: N/A;  . POLYPECTOMY    . TONSILLECTOMY    . UPPER GASTROINTESTINAL ENDOSCOPY     esophegeal dilation    Current Outpatient Medications  Medication Sig Dispense Refill  . acetaminophen (TYLENOL) 500 MG tablet Take 500 mg by mouth every 8 (eight) hours as needed for mild pain or moderate pain.     [name] albuterol (PROAIR HFA) 108 (90 BASE) MCG/ACT inhaler Inhale 2 puffs into the lungs every 4 (four) hours as needed for wheezing or shortness of breath (and prior to exercise). 1 Inhaler 3  . AMBULATORY NON FORMULARY MEDICATION Apply 1 application topically daily. Testosterone cream 150mg /15% Place 15%  onto the skin daily     . B Complex Vitamins (VITAMIN B COMPLEX PO) Take 1 tablet by mouth daily.    [name] BYSTOLIC 10 MG tablet TAKE 1 TABLET BY MOUTH EVERY DAY 90 tablet 1  . clopidogrel (PLAVIX) 75 MG tablet Take 1 tablet (75 mg total) by mouth daily with breakfast. 30 tablet 6  . dicyclomine (BENTYL) 10 MG capsule Take 1 capsule (10 mg total) by mouth 3 (three) times daily before meals. (Patient taking differently: Take 10 mg by mouth 3 (three) times daily as needed for spasms. ) 90 capsule 11  . diltiazem (CARDIZEM CD) 120 MG 24 hr capsule TAKE ONE CAPSULE BY MOUTH ONCE EACH DAY. (Patient taking differently: Take 120 mg by mouth daily. ) 90 capsule 3  . dofetilide (TIKOSYN) 250 MCG capsule TAKE 1 CAPSULE BY MOUTH  EVERY 12 HOURS *NEED OFFICE VISIT* (Patient taking differently: Take 250 mcg by mouth 2 (two) times daily. ) 180 capsule 3  . ezetimibe (ZETIA) 10 MG tablet TAKE 1 TABLET BY MOUTH EVERY DAY 90 tablet 3  . famotidine (PEPCID) 40 MG tablet Take 1 tablet (40 mg total) by mouth at bedtime. 30 tablet 11  . furosemide (LASIX) 40 MG tablet Take 20-40 mg by mouth daily.     [name] HYDROcodone-acetaminophen (NORCO) 5-325 MG tablet Take 1 tablet by mouth every 6 (six) hours as needed for moderate pain. 8 tablet 0  . lactobacillus acidophilus & bulgar (LACTINEX) chewable tablet Chew 2 tablets by mouth daily.     [name] loperamide (IMODIUM) 2 MG capsule Take 2 mg by mouth daily.     [name] LORazepam (ATIVAN) 0.5 MG tablet Take 0.5 mg by mouth at bedtime.     [name] losartan (COZAAR) 25 MG tablet Take 0.5 tablets (12.5 mg total) by mouth daily. 45 tablet 3  . methocarbamol (ROBAXIN) 500 MG tablet Take 500 mg by mouth 3 (three) times daily as needed for muscle spasms.     . nitroGLYCERIN (NITROSTAT) 0.4 MG SL tablet Place 1 tablet (0.4 mg total) under the tongue every 5 (five) minutes as needed for chest pain. 25 tablet 0  . potassium chloride SA (K-DUR,KLOR-CON) 20 MEQ tablet Take 20 mEq by mouth daily.    . Probiotic  Product (ALIGN) 4 MG CAPS Take 4 mg by mouth daily.    . rosuvastatin (CRESTOR) 40 MG tablet Take 40 mg by mouth every evening.    . sodium chloride (OCEAN) 0.65 % SOLN nasal spray Place 1 spray into both nostrils as needed for congestion.    . traZODone (DESYREL) 50 MG tablet Take 50 mg by mouth at bedtime.    . Vitamin D, Ergocalciferol, (DRISDOL) 50000 UNITS CAPS capsule Take 50,000 Units by mouth every 14 (fourteen) days. Take on 1st and 15th    . XARELTO 15 MG TABS tablet TAKE 1 TABLET EVERY EVENING 90 tablet 1   No current facility-administered medications for this visit.     Allergies:   Morphine and related and Tape   Social History:  The patient  reports that he quit smoking about 52 years ago. His smoking use included cigarettes. He has a 2.50 pack-year smoking history. He quit smokeless tobacco use about 70 years ago. He reports that he does not drink alcohol or use drugs.   Family History:  The patient's  family history includes Cancer in his son; Heart Problems in his brother; Heart disease in his father, maternal grandfather, maternal grandmother, mother, paternal grandfather, and paternal grandmother; Prostate cancer in his paternal uncle.   ROS:  Please see the history of present illness.   All other systems are personally reviewed and negative.    Exam:    Vital Signs:  BP 122/62   Pulse 85   Well appearing, alert and conversant, regular work of breathing,  good skin color Eyes- anicteric, neuro- grossly intact, skin- no apparent rash or lesions or cyanosis, mouth- oral mucosa is pink   Labs/Other Tests and Data Reviewed:    Recent Labs: 12/15/2018: Magnesium 2.4 01/04/2019: ALT 26 05/09/2019: BUN 21; Creatinine, Ser 1.62; Hemoglobin 14.7; Platelets 161; Potassium 4.4; Sodium 135   Wt Readings from Last 3 Encounters:  05/09/19 127 lb (57.6 kg)  05/07/19 127 lb (57.6 kg)  05/03/19 126 lb (57.2 kg)     Other studies personally reviewed: Additional studies/  records that were reviewed today include: ECG 05/03/2019 personally reviewed Review of the above records today demonstrates: Sinus rhythm, rate 50   ASSESSMENT & PLAN:    1.  Persistent atrial fibrillation/atypical atrial flutter: Currently on Xarelto, dofetilide, diltiazem, Bystolic.  Had prior ablation at [facility] with Dr. Lehman Prom.  We had previously asked for records from his ablation from 2015 from [facility].  Unfortunately there are pages missing that are vital to understanding the procedure.  We Thuan Tippett contact [facility] again to ask for these records.  Unfortunately he continues to have symptoms from atrial fibrillation.  This point, I feel that he would benefit likely from ablation.  Risks and benefits were discussed and include bleeding, tamponade, heart block, stroke, damage surrounding organs.  Patient understands these risks and is agreed to the procedure.  As he has had a prior ablation, he would need a CT scan prior to his procedure.  We Tashina Credit try and work him in within the month.  This patients CHA2DS2-VASc Score and unadjusted Ischemic Stroke Rate (% per year) is equal to 7.2 % stroke rate/year from a score of 5  Above score calculated as 1 point each if present [CHF, HTN, DM, Vascular=MI/PAD/Aortic Plaque, Age if 65-74, or Male] Above score calculated as 2 points each if present [Age > 75, or Stroke/TIA/TE]  2.  Obstructive sleep apnea: CPAP compliance encouraged  3.  Coronary artery disease: Status post CABG.  No current chest pain.  4.  Hypertension: Currently well controlled  COVID 19 screen The patient denies symptoms of COVID 19 at this time.  The importance of social distancing was discussed today.  Follow-up: 3 months  Current medicines are reviewed at length with the patient today.   The patient does not have concerns regarding his medicines.  The following changes were made today:  none  Labs/ tests ordered today include:  No orders of the defined types were placed in this  encounter.  Case discussed with primary cardiology  Patient Risk:  after full review of this patients clinical status, I feel that they are at moderate risk at this time.  Today, I have spent 10 minutes with the patient with telehealth technology discussing atrial fibrillation.    Signed, Latise Dilley Meredith Leeds, MD  05/22/2019 10:56 AM     [name] [address] [place] [address] [phone] (office) [phone] (fax)

## 2019-05-23 ENCOUNTER — Other Ambulatory Visit: Payer: Self-pay | Admitting: Cardiology

## 2019-05-28 ENCOUNTER — Telehealth: Payer: Self-pay | Admitting: Cardiology

## 2019-05-28 DIAGNOSIS — I4819 Other persistent atrial fibrillation: Secondary | ICD-10-CM

## 2019-05-28 DIAGNOSIS — I4891 Unspecified atrial fibrillation: Secondary | ICD-10-CM

## 2019-05-28 NOTE — Telephone Encounter (Signed)
Informed pt & wife that records received and being scanned into chart tomorrow. Aware I will have [place] review report. Holding 6/12 for procedure date. Pt appreciative and agreeable to plan.

## 2019-05-28 NOTE — Telephone Encounter (Signed)
° °  Patient's spouse requesting call from nurse Patient wants to discuss next steps with care plan. Have records been obtained from [facility] Will there be order placed for CT scan

## 2019-05-28 NOTE — Telephone Encounter (Signed)
Pt informed that I would reach back out to MR and discuss obtaining records STAT. Aware I will follow up tomorrow and if no success then his wife is more than welcome to go (per their suggestion) pick up if they prefer.  Pt would really like ablation ASAP.

## 2019-05-29 NOTE — Telephone Encounter (Signed)
Informed ok to proceed w/ ablation, per [place] (after record review). Pt aware I will schedule for 6/12. Aware office will call him to schedule CT testing. Aware I will follow up by Thursday to schedule COVID testing  And go over instructions for procedure and COVID. Pt agreeable to plan.

## 2019-05-30 ENCOUNTER — Other Ambulatory Visit (HOSPITAL_COMMUNITY): Payer: Self-pay | Admitting: Emergency Medicine

## 2019-05-30 ENCOUNTER — Telehealth (HOSPITAL_COMMUNITY): Payer: Self-pay | Admitting: Emergency Medicine

## 2019-05-30 DIAGNOSIS — N289 Disorder of kidney and ureter, unspecified: Secondary | ICD-10-CM

## 2019-05-30 NOTE — Telephone Encounter (Signed)
Reaching out to patient to offer assistance regarding upcoming cardiac imaging study; pt verbalizes understanding of appt date/time, parking situation and where to check in, pre-test NPO status and medications ordered, and verified current allergies; name and call back number provided for further questions should they arise Jimmy Bond RN [facility] [phone] office [phone] cell  Pt denies covid symptoms, verbalized understanding of visitor policy.  Pt understands he has an appoinment in medical day at 9am for a bicarb infusion for his cardiac CT

## 2019-05-31 ENCOUNTER — Other Ambulatory Visit: Payer: Self-pay

## 2019-05-31 MED ORDER — METOPROLOL TARTRATE 50 MG PO TABS: 50.0000 mg | ORAL_TABLET | Freq: Once | ORAL | 0 refills | Status: DC

## 2019-05-31 NOTE — Telephone Encounter (Signed)
CT instructions reviewed w/ pt. Lopressor 50 mg tablet sent to pharmacy. Pt aware I will call back this afternoon to go over procedure instructions and scheduled COVID testing. Pt agreeable.

## 2019-06-01 ENCOUNTER — Telehealth (HOSPITAL_COMMUNITY): Payer: Self-pay

## 2019-06-01 ENCOUNTER — Ambulatory Visit (HOSPITAL_COMMUNITY)
Admission: RE | Admit: 2019-06-01 | Discharge: 2019-06-01 | Disposition: A | Discharge status: Home / Self Care | Payer: Medicare Other | Source: Ambulatory Visit | Attending: Cardiology | Admitting: Cardiology

## 2019-06-01 ENCOUNTER — Ambulatory Visit (HOSPITAL_COMMUNITY): Payer: Medicare Other

## 2019-06-01 DIAGNOSIS — N289 Disorder of kidney and ureter, unspecified: Secondary | ICD-10-CM | POA: Insufficient documentation

## 2019-06-01 DIAGNOSIS — I4891 Unspecified atrial fibrillation: Secondary | ICD-10-CM | POA: Insufficient documentation

## 2019-06-01 LAB — BASIC METABOLIC PANEL
Anion gap: 12 (ref 5–15)
BUN: 30 mg/dL — ABNORMAL HIGH (ref 8–23)
CO2: 25 mmol/L (ref 22–32)
Calcium: 10 mg/dL (ref 8.9–10.3)
Chloride: 100 mmol/L (ref 98–111)
Creatinine, Ser: 1.73 mg/dL — ABNORMAL HIGH (ref 0.61–1.24)
GFR calc Af Amer: 42 mL/min — ABNORMAL LOW (ref >60)
GFR calc non Af Amer: 36 mL/min — ABNORMAL LOW (ref >60)
Glucose, Bld: 97 mg/dL (ref 70–99)
Potassium: 4.3 mmol/L (ref 3.5–5.1)
Sodium: 137 mmol/L (ref 135–145)

## 2019-06-01 MED ORDER — SODIUM BICARBONATE 8.4 % IV SOLN
INTRAVENOUS | Status: AC
  Administered 2019-06-01: 10:00:00 via INTRAVENOUS
  Filled 2019-06-01: qty 500

## 2019-06-01 MED ORDER — SODIUM BICARBONATE BOLUS VIA INFUSION
INTRAVENOUS | Status: AC
  Administered 2019-06-01: 180 meq via INTRAVENOUS
  Filled 2019-06-01: qty 1

## 2019-06-01 MED ORDER — IOHEXOL 350 MG/ML SOLN
100.0000 mL | Freq: Once | INTRAVENOUS | Status: AC | PRN
  Administered 2019-06-01: 13:00:00 70 mL via INTRAVENOUS

## 2019-06-01 NOTE — Telephone Encounter (Signed)
LVM regarding results and med changes

## 2019-06-01 NOTE — Telephone Encounter (Signed)
-----   Message from Larey Dresser, MD sent at 06/01/2019  2:00 PM EDT ----- Creatinine higher, cut back on Lasix to 20 mg daily but make sure to keep sodium extremely low in diet.

## 2019-06-01 NOTE — Telephone Encounter (Signed)
Reviewed procedure instructions w/ pt. Pt aware to arrive to [facility], main entrance "A", at 5:30 am morning of 6/12. Reminded not to hold Xarelto, take it all the way up to procedure.  NPO morning of procedure. COVID testing scheduled for 6/9. Screening instructions reviewed w/ pt. Aware office will call to arrange post procedure appts Patient verbalized understanding and agreeable to plan.

## 2019-06-05 ENCOUNTER — Other Ambulatory Visit (HOSPITAL_COMMUNITY)
Admission: RE | Admit: 2019-06-05 | Discharge: 2019-06-05 | Disposition: A | Discharge status: Home / Self Care | Payer: Medicare Other | Source: Ambulatory Visit | Attending: Cardiology | Admitting: Cardiology

## 2019-06-05 DIAGNOSIS — Z01812 Encounter for preprocedural laboratory examination: Secondary | ICD-10-CM | POA: Diagnosis not present

## 2019-06-05 DIAGNOSIS — Z1159 Encounter for screening for other viral diseases: Secondary | ICD-10-CM | POA: Diagnosis not present

## 2019-06-05 MED ORDER — FUROSEMIDE 20 MG PO TABS: 20.0000 mg | ORAL_TABLET | Freq: Every day | ORAL | 6 refills | Status: DC

## 2019-06-05 NOTE — Telephone Encounter (Signed)
Called patient, advised of lab results. Advised to change how he takes lasix.  Pt will start Lasix 20mg  daily and to keep sodium low in diet. Verbalized understanding.

## 2019-06-05 NOTE — Telephone Encounter (Signed)
Pt requesting labs to be faxed to him. Per [name], ok to fax results. Results faxed

## 2019-06-05 NOTE — Telephone Encounter (Signed)
-----   Message from Larey Dresser, MD sent at 06/01/2019  2:00 PM EDT ----- Creatinine higher, cut back on Lasix to 20 mg daily but make sure to keep sodium extremely low in diet.

## 2019-06-06 LAB — NOVEL CORONAVIRUS, NAA (HOSP ORDER, SEND-OUT TO REF LAB; TAT 18-24 HRS): SARS-CoV-2, NAA: NOT DETECTED

## 2019-06-07 ENCOUNTER — Telehealth: Payer: Self-pay | Admitting: Cardiology

## 2019-06-07 NOTE — Anesthesia Preprocedure Evaluation (Addendum)
Anesthesia Evaluation  Patient identified by MRN, date of birth, ID band Patient awake    Reviewed: Allergy & Precautions, NPO status , Patient's Chart, lab work & pertinent test results  History of Anesthesia Complications (+) DIFFICULT AIRWAY  Airway Mallampati: II  TM Distance: >3 FB Neck ROM: Full    Dental  (+) Chipped, Dental Advisory Given   Pulmonary sleep apnea (doesn't tolerate BiPAP) , COPD,  COPD inhaler, former smoker,    breath sounds clear to auscultation       Cardiovascular hypertension, Pt. on medications and Pt. on home beta blockers (-) angina+ CAD, + Past MI (02/2019 NSTEMI), + Cardiac Stents, + CABG and +CHF  + dysrhythmias Atrial Fibrillation  Rhythm:Irregular Rate:Normal  02/2019 ECHO: EF 45-50%. LV cavity mildly dilated. Posterior lateral and inferior basal hypokinesis, mild MR, mild-mod TR   Neuro/Psych CVA (difficulty swallowing, Horner's), Residual Symptoms    GI/Hepatic Neg liver ROS, GERD  Medicated and Controlled,  Endo/Other  raynaud's  Renal/GU Renal InsufficiencyRenal disease (creat 1.62)     Musculoskeletal   Abdominal   Peds  Hematology xarelto   Anesthesia Other Findings   Reproductive/Obstetrics                             Anesthesia Physical  Anesthesia Plan  ASA: III  Anesthesia Plan: General   Post-op Pain Management:    Induction: Intravenous  PONV Risk Score and Plan: 1 and Ondansetron and Treatment may vary due to age or medical condition  Airway Management Planned: LMA and Oral ETT  Additional Equipment:   Intra-op Plan:   Post-operative Plan: Extubation in OR  Informed Consent: I have reviewed the patients History and Physical, chart, labs and discussed the procedure including the risks, benefits and alternatives for the proposed anesthesia with the patient or authorized representative who has indicated his/her understanding and  acceptance.     Dental advisory given  Plan Discussed with: CRNA  Anesthesia Plan Comments:         Anesthesia Quick Evaluation

## 2019-06-07 NOTE — Telephone Encounter (Signed)
New Message     Pts wife is calling and has questions about the upcoming procedure  She would like for Sherri to give her a call back    Please call

## 2019-06-07 NOTE — Telephone Encounter (Signed)
Reviewed tomorrow/s procedure instructions with pt and wife. They appreciate going over this again with them.

## 2019-06-08 ENCOUNTER — Encounter (HOSPITAL_COMMUNITY)
Admission: RE | Disposition: A | Discharge status: Home / Self Care | Payer: Medicare Other | Source: Home / Self Care | Attending: Cardiology

## 2019-06-08 ENCOUNTER — Other Ambulatory Visit: Payer: Self-pay

## 2019-06-08 ENCOUNTER — Encounter (HOSPITAL_COMMUNITY): Payer: Self-pay | Admitting: Certified Registered Nurse Anesthetist

## 2019-06-08 ENCOUNTER — Ambulatory Visit (HOSPITAL_COMMUNITY): Payer: Medicare Other | Admitting: Anesthesiology

## 2019-06-08 ENCOUNTER — Ambulatory Visit (HOSPITAL_COMMUNITY)
Admission: RE | Admit: 2019-06-08 | Discharge: 2019-06-08 | Disposition: A | Discharge status: Home / Self Care | Payer: Medicare Other | Attending: Cardiology | Admitting: Cardiology

## 2019-06-08 DIAGNOSIS — Z951 Presence of aortocoronary bypass graft: Secondary | ICD-10-CM | POA: Diagnosis not present

## 2019-06-08 DIAGNOSIS — G4733 Obstructive sleep apnea (adult) (pediatric): Secondary | ICD-10-CM | POA: Diagnosis not present

## 2019-06-08 DIAGNOSIS — I4891 Unspecified atrial fibrillation: Secondary | ICD-10-CM | POA: Diagnosis not present

## 2019-06-08 DIAGNOSIS — Z7901 Long term (current) use of anticoagulants: Secondary | ICD-10-CM | POA: Diagnosis not present

## 2019-06-08 DIAGNOSIS — I5032 Chronic diastolic (congestive) heart failure: Secondary | ICD-10-CM | POA: Diagnosis not present

## 2019-06-08 DIAGNOSIS — J449 Chronic obstructive pulmonary disease, unspecified: Secondary | ICD-10-CM | POA: Insufficient documentation

## 2019-06-08 DIAGNOSIS — I252 Old myocardial infarction: Secondary | ICD-10-CM | POA: Insufficient documentation

## 2019-06-08 DIAGNOSIS — Z79899 Other long term (current) drug therapy: Secondary | ICD-10-CM | POA: Diagnosis not present

## 2019-06-08 DIAGNOSIS — K219 Gastro-esophageal reflux disease without esophagitis: Secondary | ICD-10-CM | POA: Insufficient documentation

## 2019-06-08 DIAGNOSIS — I110 Hypertensive heart disease with heart failure: Secondary | ICD-10-CM | POA: Diagnosis not present

## 2019-06-08 DIAGNOSIS — E785 Hyperlipidemia, unspecified: Secondary | ICD-10-CM | POA: Diagnosis not present

## 2019-06-08 DIAGNOSIS — Z8673 Personal history of transient ischemic attack (TIA), and cerebral infarction without residual deficits: Secondary | ICD-10-CM | POA: Diagnosis not present

## 2019-06-08 DIAGNOSIS — I4892 Unspecified atrial flutter: Secondary | ICD-10-CM | POA: Diagnosis not present

## 2019-06-08 DIAGNOSIS — I2510 Atherosclerotic heart disease of native coronary artery without angina pectoris: Secondary | ICD-10-CM | POA: Insufficient documentation

## 2019-06-08 DIAGNOSIS — Z87891 Personal history of nicotine dependence: Secondary | ICD-10-CM | POA: Diagnosis not present

## 2019-06-08 DIAGNOSIS — I4819 Other persistent atrial fibrillation: Secondary | ICD-10-CM | POA: Diagnosis not present

## 2019-06-08 HISTORY — PX: ATRIAL FIBRILLATION ABLATION: EP1191

## 2019-06-08 LAB — BASIC METABOLIC PANEL
Anion gap: 9 (ref 5–15)
BUN: 19 mg/dL (ref 8–23)
CO2: 26 mmol/L (ref 22–32)
Calcium: 10 mg/dL (ref 8.9–10.3)
Chloride: 102 mmol/L (ref 98–111)
Creatinine, Ser: 1.86 mg/dL — ABNORMAL HIGH (ref 0.61–1.24)
GFR calc Af Amer: 38 mL/min — ABNORMAL LOW (ref >60)
GFR calc non Af Amer: 33 mL/min — ABNORMAL LOW (ref >60)
Glucose, Bld: 92 mg/dL (ref 70–99)
Potassium: 4 mmol/L (ref 3.5–5.1)
Sodium: 137 mmol/L (ref 135–145)

## 2019-06-08 LAB — POCT ACTIVATED CLOTTING TIME
Activated Clotting Time: 703 seconds
Activated Clotting Time: 747 seconds
Activated Clotting Time: 775 seconds
Activated Clotting Time: 472 seconds
Activated Clotting Time: 582 seconds
Activated Clotting Time: 164 seconds

## 2019-06-08 LAB — CBC
HCT: 43.8 % (ref 39.0–52.0)
Hemoglobin: 14.4 g/dL (ref 13.0–17.0)
MCH: 32.9 pg (ref 26.0–34.0)
MCHC: 32.9 g/dL (ref 30.0–36.0)
MCV: 100 fL (ref 80.0–100.0)
Platelets: 152 10*3/uL (ref 150–400)
RBC: 4.38 MIL/uL (ref 4.22–5.81)
RDW: 13 % (ref 11.5–15.5)
WBC: 6.6 10*3/uL (ref 4.0–10.5)
nRBC: 0 % (ref 0.0–0.2)

## 2019-06-08 SURGERY — ATRIAL FIBRILLATION ABLATION
Anesthesia: General

## 2019-06-08 MED ORDER — SODIUM CHLORIDE 0.9% FLUSH: 3.0000 mL | INTRAVENOUS | Status: DC | PRN

## 2019-06-08 MED ORDER — PROPOFOL 10 MG/ML IV BOLUS
INTRAVENOUS | Status: DC | PRN
  Administered 2019-06-08: 120 mg via INTRAVENOUS

## 2019-06-08 MED ORDER — SODIUM CHLORIDE 0.9% FLUSH: 3.0000 mL | Freq: Two times a day (BID) | INTRAVENOUS | Status: DC

## 2019-06-08 MED ORDER — ONDANSETRON HCL 4 MG/2ML IJ SOLN: 4.0000 mg | Freq: Four times a day (QID) | INTRAMUSCULAR | Status: DC | PRN

## 2019-06-08 MED ORDER — LIDOCAINE 2% (20 MG/ML) 5 ML SYRINGE
INTRAMUSCULAR | Status: DC | PRN
  Administered 2019-06-08: 100 mg via INTRAVENOUS

## 2019-06-08 MED ORDER — SUGAMMADEX SODIUM 200 MG/2ML IV SOLN
INTRAVENOUS | Status: DC | PRN
  Administered 2019-06-08: 200 mg via INTRAVENOUS

## 2019-06-08 MED ORDER — SODIUM CHLORIDE 0.9 % IV SOLN
INTRAVENOUS | Status: DC | PRN
  Administered 2019-06-08: 30 ug/min via INTRAVENOUS

## 2019-06-08 MED ORDER — HEPARIN (PORCINE) IN NACL 1000-0.9 UT/500ML-% IV SOLN
INTRAVENOUS | Status: DC | PRN
  Administered 2019-06-08 (×3): 500 mL

## 2019-06-08 MED ORDER — HEPARIN (PORCINE) IN NACL 1000-0.9 UT/500ML-% IV SOLN
INTRAVENOUS | Status: AC
  Filled 2019-06-08: qty 2500

## 2019-06-08 MED ORDER — PHENYLEPHRINE 40 MCG/ML (10ML) SYRINGE FOR IV PUSH (FOR BLOOD PRESSURE SUPPORT): PREFILLED_SYRINGE | INTRAVENOUS | Status: DC | PRN

## 2019-06-08 MED ORDER — ACETAMINOPHEN 325 MG PO TABS
650.0000 mg | ORAL_TABLET | ORAL | Status: DC | PRN
  Filled 2019-06-08: qty 2

## 2019-06-08 MED ORDER — PROTAMINE SULFATE 10 MG/ML IV SOLN
INTRAVENOUS | Status: DC | PRN
  Administered 2019-06-08: 50 mg via INTRAVENOUS
  Administered 2019-06-08: 30 mg via INTRAVENOUS

## 2019-06-08 MED ORDER — BUPIVACAINE HCL (PF) 0.25 % IJ SOLN
INTRAMUSCULAR | Status: DC | PRN
  Administered 2019-06-08: 20 mL

## 2019-06-08 MED ORDER — HEPARIN SODIUM (PORCINE) 1000 UNIT/ML IJ SOLN
INTRAMUSCULAR | Status: DC | PRN
  Administered 2019-06-08: 1000 [IU] via INTRAVENOUS

## 2019-06-08 MED ORDER — HEPARIN SODIUM (PORCINE) 1000 UNIT/ML IJ SOLN
INTRAMUSCULAR | Status: AC
  Filled 2019-06-08: qty 1

## 2019-06-08 MED ORDER — HEPARIN SODIUM (PORCINE) 1000 UNIT/ML IJ SOLN
INTRAMUSCULAR | Status: DC | PRN
  Administered 2019-06-08: 13000 [IU] via INTRAVENOUS

## 2019-06-08 MED ORDER — ROCURONIUM BROMIDE 10 MG/ML (PF) SYRINGE
PREFILLED_SYRINGE | INTRAVENOUS | Status: DC | PRN
  Administered 2019-06-08: 50 mg via INTRAVENOUS

## 2019-06-08 MED ORDER — DEXAMETHASONE SODIUM PHOSPHATE 10 MG/ML IJ SOLN
INTRAMUSCULAR | Status: DC | PRN
  Administered 2019-06-08: 4 mg via INTRAVENOUS

## 2019-06-08 MED ORDER — SODIUM CHLORIDE 0.9 % IV SOLN
INTRAVENOUS | Status: DC
  Administered 2019-06-08 (×2): via INTRAVENOUS

## 2019-06-08 MED ORDER — HEPARIN (PORCINE) IN NACL 1000-0.9 UT/500ML-% IV SOLN
INTRAVENOUS | Status: DC | PRN
  Administered 2019-06-08 (×2): 500 mL

## 2019-06-08 MED ORDER — METOPROLOL TARTRATE 50 MG PO TABS
50.0000 mg | ORAL_TABLET | Freq: Once | ORAL | Status: AC
  Administered 2019-06-08: 50 mg via ORAL
  Filled 2019-06-08: qty 1

## 2019-06-08 MED ORDER — BUPIVACAINE HCL (PF) 0.25 % IJ SOLN
INTRAMUSCULAR | Status: AC
  Filled 2019-06-08: qty 30

## 2019-06-08 MED ORDER — SODIUM CHLORIDE 0.9 % IV SOLN: 250.0000 mL | INTRAVENOUS | Status: DC | PRN

## 2019-06-08 SURGICAL SUPPLY — 20 items
BLANKET WARM UNDERBOD FULL ACC (MISCELLANEOUS) ×3 IMPLANT
CATH MAPPNG PENTARAY F 2-6-2MM (CATHETERS) ×1 IMPLANT
CATH SMTCH THERMOCOOL SF DF (CATHETERS) ×3 IMPLANT
CATH SOUNDSTAR 3D IMAGING (CATHETERS) ×3 IMPLANT
CATH WEBSTER BI DIR CS D-F CRV (CATHETERS) ×3 IMPLANT
COVER SWIFTLINK CONNECTOR (BAG) ×3 IMPLANT
PACK EP LATEX FREE (CUSTOM PROCEDURE TRAY) ×2
PACK EP LF (CUSTOM PROCEDURE TRAY) ×1 IMPLANT
PAD PRO RADIOLUCENT 2001M-C (PAD) ×3 IMPLANT
PATCH CARTO3 (PAD) ×3 IMPLANT
PENTARAY F 2-6-2MM (CATHETERS) ×3
SHEATH AVANTI 11F 11CM (SHEATH) ×3 IMPLANT
SHEATH BAYLIS SUREFLEX  M 8.5 (SHEATH) ×2
SHEATH BAYLIS SUREFLEX M 8.5 (SHEATH) ×1 IMPLANT
SHEATH BAYLIS TRANSSEPTAL 98CM (NEEDLE) ×3 IMPLANT
SHEATH CARTO VIZIGO SM CVD (SHEATH) ×3 IMPLANT
SHEATH PINNACLE 7F 10CM (SHEATH) ×3 IMPLANT
SHEATH PINNACLE 8F 10CM (SHEATH) ×6 IMPLANT
SHEATH PINNACLE 9F 10CM (SHEATH) ×6 IMPLANT
TUBING SMART ABLATE COOLFLOW (TUBING) ×3 IMPLANT

## 2019-06-08 NOTE — Discharge Instructions (Signed)
Femoral Site Care This sheet gives you information about how to care for yourself after your procedure. Your health care provider may also give you more specific instructions. If you have problems or questions, contact your health care provider. What can I expect after the procedure? After the procedure, it is common to have:  Bruising that usually fades within 1-2 weeks.  Tenderness at the site. Follow these instructions at home: Wound care  Follow instructions from your health care provider about how to take care of your insertion site. Make sure you: ? Wash your hands with soap and water before you change your bandage (dressing). If soap and water are not available, use hand sanitizer. ? Change your dressing as told by your health care provider. ? Leave stitches (sutures), skin glue, or adhesive strips in place. These skin closures may need to stay in place for 2 weeks or longer. If adhesive strip edges start to loosen and curl up, you may trim the loose edges. Do not remove adhesive strips completely unless your health care provider tells you to do that.  Do not take baths, swim, or use a hot tub until your health care provider approves.  You may shower 24-48 hours after the procedure or as told by your health care provider. ? Gently wash the site with plain soap and water. ? Pat the area dry with a clean towel. ? Do not rub the site. This may cause bleeding.  Do not apply powder or lotion to the site. Keep the site clean and dry.  Check your femoral site every day for signs of infection. Check for: ? Redness, swelling, or pain. ? Fluid or blood. ? Warmth. ? Pus or a bad smell. Activity  For the first 2-3 days after your procedure, or as long as directed: ? Avoid climbing stairs as much as possible. ? Do not squat.  Do not lift anything that is heavier than  5 lbs for 1 week, or the limit that you are told, until your health care provider says that it is safe.  Rest as  directed. ? Avoid sitting for a long time without moving. Get up to take short walks every 1-2 hours.  Do not drive for  4 days if you were given a medicine to help you relax (sedative). General instructions  Take over-the-counter and prescription medicines only as told by your health care provider.  Keep all follow-up visits as told by your health care provider. This is important. Contact a health care provider if you have:  A fever or chills.  You have redness, swelling, or pain around your insertion site. Get help right away if:  The catheter insertion area swells very fast.  You pass out.  You suddenly start to sweat or your skin gets clammy.  The catheter insertion area is bleeding, and the bleeding does not stop when you hold steady pressure on the area.  The area near or just beyond the catheter insertion site becomes pale, cool, tingly, or numb. These symptoms may represent a serious problem that is an emergency. Do not wait to see if the symptoms will go away. Get medical help right away. Call your local emergency services (911 in the [place] Do not drive yourself to the hospital. Summary  After the procedure, it is common to have bruising that usually fades within 1-2 weeks.  Check your femoral site every day for signs of infection.  Do not lift anything that is heavier than 10 lb (4.5  kg), or the limit that you are told, until your health care provider says that it is safe. This information is not intended to replace advice given to you by your health care provider. Make sure you discuss any questions you have with your health care provider. Document Released: 08/16/2014 Document Revised: 12/26/2017 Document Reviewed: 12/26/2017 Elsevier Interactive Patient Education  2019 [place] procedure care instructions No driving for 4 days. No lifting over 5 lbs for 1 week. No vigorous or sexual activity for 1 week. You may return to work on 06/15/2019. Keep  procedure site clean & dry. If you notice increased pain, swelling, bleeding or pus, call/return!  You may shower, but no soaking baths/hot tubs/pools for 1 week.   You have an appointment set up with the [facility].  Multiple studies have shown that being followed by a dedicated atrial fibrillation clinic in addition to the standard care you receive from your other physicians improves health. We believe that enrollment in the atrial fibrillation clinic will allow [place] to better care for you.    The phone number to the [facility] is [phone]. The clinic is staffed Monday through Friday from 8:30am to 5pm.  Parking Directions: The clinic is located in the Heart and Vascular Building connected to [facility]. 1)From [address] turn on to [address] and go to the 3rd entrance  (Heart and Vascular entrance) on the right. 2)Look to the right for Heart &Vascular Parking Garage. 3)A code for the entrance is required please call the clinic to receive this.   4)Take the elevators to the 1st floor. Registration is in the room with the glass walls at the end of the hallway.  If you have any trouble parking or locating the clinic, please dont hesitate to call [phone].

## 2019-06-08 NOTE — Progress Notes (Signed)
Site area: left groin fv sheaths x2 Site Prior to Removal:  Level 0 Pressure Applied For:  15 minutes Manual:   yes Patient Status During Pull:  stable Post Pull Site:  Level  0 Post Pull Instructions Given:  yes Post Pull Pulses Present: left dp palpable Dressing Applied:  Gauze and tegaderm Bedrest begins @ 1155 Comments:  IV saline locked

## 2019-06-08 NOTE — Progress Notes (Signed)
Up and walked and tolerated well; bilat groins stable, no bleeding or hematoma 

## 2019-06-08 NOTE — Anesthesia Postprocedure Evaluation (Signed)
Anesthesia Post Note  Patient: [name]  Procedure(s) Performed: ATRIAL FIBRILLATION ABLATION (N/A )     Patient location during evaluation: PACU Anesthesia Type: General Level of consciousness: awake and alert Pain management: pain level controlled Vital Signs Assessment: post-procedure vital signs reviewed and stable Respiratory status: spontaneous breathing, nonlabored ventilation, respiratory function stable and patient connected to nasal cannula oxygen Cardiovascular status: blood pressure returned to baseline and stable Postop Assessment: no apparent nausea or vomiting Anesthetic complications: no    Last Vitals:  Vitals:   06/08/19 1204 06/08/19 1236  BP:  (!) 131/53  Pulse:  63  Resp:  16  Temp: 36.5 C   SpO2:  99%    Last Pain:  Vitals:   06/08/19 1204  TempSrc: Temporal  PainSc:                  Barnet [place]

## 2019-06-08 NOTE — Progress Notes (Signed)
Site area: rt groin fv sheaths x2 Site Prior to Removal:  Level 0 Pressure Applied For: 15 minutes Manual:   yes Patient Status During Pull:  stable Post Pull Site:  Level  0 Post Pull Instructions Given:  yes Post Pull Pulses Present: rt dp palpable Dressing Applied:  Gauze and tegaderm Bedrest begins @  Comments:

## 2019-06-08 NOTE — H&P (Signed)
[name] has presented today for surgery, with the diagnosis of atrial fibrillation.  The various methods of treatment have been discussed with the patient and family. After consideration of risks, benefits and other options for treatment, the patient has consented to  Procedure(s): Catheter ablation as a surgical intervention .  Risks include but not limited to bleeding, tamponade, heart block, stroke, damage to surrounding organs, among others. The patient's history has been reviewed, patient examined, no change in status, stable for surgery.  I have reviewed the patient's chart and labs.  Questions were answered to the patient's satisfaction.    Jimmy Luallen Curt Bears, MD 06/08/2019 7:14 AM

## 2019-06-08 NOTE — Progress Notes (Signed)
Cardiac monitor showing A-fib; Dr Curt Bears notified and no new orders noted

## 2019-06-08 NOTE — Transfer of Care (Signed)
Immediate Anesthesia Transfer of Care Note  Patient: [name]  Procedure(s) Performed: ATRIAL FIBRILLATION ABLATION (N/A )  Patient Location: Cath Lab  Anesthesia Type:General  Level of Consciousness: awake  Airway & Oxygen Therapy: Patient Spontanous Breathing and Patient connected to nasal cannula oxygen  Post-op Assessment: Report given to RN and Post -op Vital signs reviewed and stable  Post vital signs: Reviewed and stable  Last Vitals:  Vitals Value Taken Time  BP 103/46 06/08/19 1100  Temp 36.2 C 06/08/19 1052  Pulse 58 06/08/19 1103  Resp 17 06/08/19 1103  SpO2 99 % 06/08/19 1103  Vitals shown include unvalidated device data.  Last Pain:  Vitals:   06/08/19 1052  TempSrc: Temporal  PainSc: 0-No pain         Complications: No apparent anesthesia complications

## 2019-06-08 NOTE — Anesthesia Procedure Notes (Addendum)
Procedure Name: Intubation Performed by: Milford Cage, CRNA Pre-anesthesia Checklist: Patient identified, Emergency Drugs available, Suction available and Patient being monitored Patient Re-evaluated:Patient Re-evaluated prior to induction Oxygen Delivery Method: Circle System Utilized Preoxygenation: Pre-oxygenation with 100% oxygen Induction Type: IV induction Ventilation: Mask ventilation without difficulty Laryngoscope Size: Mac and 3 Grade View: Grade II Tube type: Oral Tube size: 7.0 mm Number of attempts: 1 Airway Equipment and Method: Stylet and Oral airway Placement Confirmation: ETT inserted through vocal cords under direct vision,  positive ETCO2 and breath sounds checked- equal and bilateral Secured at: 23 cm Tube secured with: Tape Dental Injury: Teeth and Oropharynx as per pre-operative assessment  Comments: Airway *NOT* difficult - MAC3 yielded a grade IIb view and easy passing of the tube. Patient has  slightly anterior larynx

## 2019-06-09 ENCOUNTER — Other Ambulatory Visit: Payer: Self-pay

## 2019-06-09 ENCOUNTER — Emergency Department (HOSPITAL_COMMUNITY)
Admission: EM | Admit: 2019-06-09 | Discharge: 2019-06-09 | Disposition: A | Discharge status: Home / Self Care | Payer: Medicare Other | Attending: Emergency Medicine | Admitting: Emergency Medicine

## 2019-06-09 ENCOUNTER — Telehealth: Payer: Self-pay | Admitting: Student

## 2019-06-09 ENCOUNTER — Encounter (HOSPITAL_COMMUNITY): Payer: Self-pay

## 2019-06-09 DIAGNOSIS — Z87891 Personal history of nicotine dependence: Secondary | ICD-10-CM | POA: Insufficient documentation

## 2019-06-09 DIAGNOSIS — Z7902 Long term (current) use of antithrombotics/antiplatelets: Secondary | ICD-10-CM | POA: Insufficient documentation

## 2019-06-09 DIAGNOSIS — Z9889 Other specified postprocedural states: Secondary | ICD-10-CM | POA: Diagnosis not present

## 2019-06-09 DIAGNOSIS — I110 Hypertensive heart disease with heart failure: Secondary | ICD-10-CM | POA: Insufficient documentation

## 2019-06-09 DIAGNOSIS — I2510 Atherosclerotic heart disease of native coronary artery without angina pectoris: Secondary | ICD-10-CM | POA: Insufficient documentation

## 2019-06-09 DIAGNOSIS — I252 Old myocardial infarction: Secondary | ICD-10-CM | POA: Insufficient documentation

## 2019-06-09 DIAGNOSIS — R58 Hemorrhage, not elsewhere classified: Secondary | ICD-10-CM | POA: Diagnosis not present

## 2019-06-09 DIAGNOSIS — Z79899 Other long term (current) drug therapy: Secondary | ICD-10-CM | POA: Insufficient documentation

## 2019-06-09 DIAGNOSIS — I5032 Chronic diastolic (congestive) heart failure: Secondary | ICD-10-CM | POA: Insufficient documentation

## 2019-06-09 DIAGNOSIS — Z951 Presence of aortocoronary bypass graft: Secondary | ICD-10-CM | POA: Insufficient documentation

## 2019-06-09 DIAGNOSIS — Z7901 Long term (current) use of anticoagulants: Secondary | ICD-10-CM | POA: Diagnosis not present

## 2019-06-09 DIAGNOSIS — J449 Chronic obstructive pulmonary disease, unspecified: Secondary | ICD-10-CM | POA: Insufficient documentation

## 2019-06-09 DIAGNOSIS — L7622 Postprocedural hemorrhage and hematoma of skin and subcutaneous tissue following other procedure: Secondary | ICD-10-CM | POA: Diagnosis not present

## 2019-06-09 LAB — BASIC METABOLIC PANEL
Anion gap: 8 (ref 5–15)
BUN: 19 mg/dL (ref 8–23)
CO2: 23 mmol/L (ref 22–32)
Calcium: 9.7 mg/dL (ref 8.9–10.3)
Chloride: 106 mmol/L (ref 98–111)
Creatinine, Ser: 1.58 mg/dL — ABNORMAL HIGH (ref 0.61–1.24)
GFR calc Af Amer: 47 mL/min — ABNORMAL LOW (ref >60)
GFR calc non Af Amer: 40 mL/min — ABNORMAL LOW (ref >60)
Glucose, Bld: 113 mg/dL — ABNORMAL HIGH (ref 70–99)
Potassium: 4.5 mmol/L (ref 3.5–5.1)
Sodium: 137 mmol/L (ref 135–145)

## 2019-06-09 LAB — CBC
HCT: 39 % (ref 39.0–52.0)
Hemoglobin: 12.7 g/dL — ABNORMAL LOW (ref 13.0–17.0)
MCH: 32.6 pg (ref 26.0–34.0)
MCHC: 32.6 g/dL (ref 30.0–36.0)
MCV: 100.3 fL — ABNORMAL HIGH (ref 80.0–100.0)
Platelets: 131 10*3/uL — ABNORMAL LOW (ref 150–400)
RBC: 3.89 MIL/uL — ABNORMAL LOW (ref 4.22–5.81)
RDW: 13.3 % (ref 11.5–15.5)
WBC: 11.1 10*3/uL — ABNORMAL HIGH (ref 4.0–10.5)
nRBC: 0 % (ref 0.0–0.2)

## 2019-06-09 NOTE — ED Provider Notes (Addendum)
[place] EMERGENCY DEPARTMENT Provider Note   CSN: [id number] Arrival date & time: 06/09/19  5732     History   Chief Complaint No chief complaint on file.   HPI [name] is a 83 y.o. male.     HPI  83 yo Male status post ablation yesterday with bilateral groin access.  Patient reports his procedure was early in the morning and he stayed with pressure in place until late afternoon.  He has had no problems with weight gain going.  He has had ongoing bleeding from the left groin.  He describes a little bit of  ache but no significant pain.  He denies any numbness or tingling distal to the injury.  He has not been lightheaded, chest pain, or dyspnea.  He is on a relatively fast is not to restart it until tonight.  He reports laying flat all night when putting pressure on it with continued bleeding.  Past Medical History:  Diagnosis Date  . Abnormal nuclear cardiac imaging test March 2011   Has positive EKG response, no perfusion defect and normal EF  . Adenomatous colon polyp 12/2002  . Atrial fibrillation ([place])    a. s/p rfca;  b. chronic tikosyn and xarelto.  . 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 Cataract    bil cataracts removed  . Chronic diastolic CHF (congestive heart failure) ([place])    a. 04/2017 Echo: EF 65-70%, restrictive physiology, mildly dil LA.  [name] Complication of anesthesia   . COPD (chronic obstructive pulmonary disease) ([place])   . Coronary artery disease    First MI in 1988. PCI in 1996 with subsequent CABG in 1996 due to restenosis. S/P stents to LCX in 2009. Noted to have residual disease in the LAD in a diffuse manner and atretic LIMA graft. He is managed medically.   . Difficult intubation    06/08/19 update - Intubated with MAC3 with grade IIb view 7.0 tube passed easily  . Dysrhythmia    Afib  . Esophageal stricture   . GERD (gastroesophageal reflux disease)   . 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 History of Raynaud's syndrome   . Hyperlipidemia   . Hypertension   . Internal hemorrhoids   . Muscle spasm   . Myocardial infarction [facility])    MI x1 31 - 55 age  . Neuromuscular disorder ([place])    Polio - age 74  . OSA (obstructive sleep apnea) 10/16/2018   Severe OSA with AHI 37.2/hr on BiPAP  . Persistent atrial fibrillation   . Polio    age 38  . PVC (premature ventricular contraction)   . Scoliosis    mild    Patient Active Problem List   Diagnosis Date Noted  . Unstable angina ([place]) 03/25/2019  . OSA (obstructive sleep apnea) 10/16/2018  . Chronic diastolic (congestive) heart failure ([place]) 05/20/2017  . Allergic rhinitis 11/17/2015  . Acute on chronic diastolic CHF (congestive heart failure), NYHA class 4 ([place]) 01/28/2015  . Chest pain on exertion 11/14/2014  . Cerebral artery occlusion 06/24/2014  . Acid reflux 06/24/2014  . H/O cardiovascular disorder 06/24/2014  . H/O acute poliomyelitis 06/24/2014  . Extrasystole 06/24/2014  . A-fib ([place]) 06/24/2014  . Cerebral artery occlusion with cerebral infarction ([place]) 06/24/2014  . COPD (chronic obstructive pulmonary disease) ([place]) 05/02/2014  . CAFL (chronic airflow limitation) ([place]) 05/02/2014  . Atrial fibrillation ([place]) 11/11/2013  .  CAD (coronary artery disease) 09/01/2011  . Hyperlipidemia 09/01/2011  . Atherosclerosis of coronary artery 09/01/2011  . History of Raynaud's syndrome   . History of post-polio syndrome   . GERD (gastroesophageal reflux disease)   . PVC (premature ventricular contraction)   . Brainstem stroke ([place])   . Scoliosis     Past Surgical History:  Procedure Laterality Date  . ABLATION     done for a fib  . APPENDECTOMY     Age 62  . CARDIOVERSION N/A 08/22/2013   Procedure: CARDIOVERSION;  Surgeon: Carlena Bjornstad, MD;  Location: [facility] ENDOSCOPY;  Service: Cardiovascular;  Laterality: N/A;  . CARDIOVERSION N/A 01/16/2014   Procedure: CARDIOVERSION;   Surgeon: Lelon Perla, MD;  Location: [facility] ENDOSCOPY;  Service: Cardiovascular;  Laterality: N/A;  . CARDIOVERSION N/A 12/11/2018   Procedure: CARDIOVERSION;  Surgeon: Larey Dresser, MD;  Location: [facility] ENDOSCOPY;  Service: Cardiovascular;  Laterality: N/A;  . COLONOSCOPY  2008  . CORONARY ARTERY BYPASS GRAFT  1996   with a LIMA to the LAD, SVG to RCA and SVG to OM  . CORONARY STENT INTERVENTION N/A 03/26/2019   Procedure: CORONARY STENT INTERVENTION;  Surgeon: Jettie Booze, MD;  Location: [facility] CV LAB;  Service: Cardiovascular;  Laterality: N/A;  . CORONARY STENT PLACEMENT  1996   LCX  . EMBOLECTOMY Right 05/09/2019   Procedure: Embolectomy of right radial artery;  Surgeon: Serafina Mitchell, MD;  Location: [facility];  Service: Vascular;  Laterality: Right;  . ESOPHAGEAL DILATION    . EYE SURGERY    . FALSE ANEURYSM REPAIR Right 05/09/2019   Procedure: REPAIR FALSE ANEURYSM RIGHT RADIAL;  Surgeon: Serafina Mitchell, MD;  Location: [facility];  Service: Vascular;  Laterality: Right;  . LEFT HEART CATH AND CORS/GRAFTS ANGIOGRAPHY N/A 03/26/2019   Procedure: LEFT HEART CATH AND CORS/GRAFTS ANGIOGRAPHY;  Surgeon: Larey Dresser, MD;  Location: [facility] CV LAB;  Service: Cardiovascular;  Laterality: N/A;  . POLYPECTOMY    . TONSILLECTOMY    . UPPER GASTROINTESTINAL ENDOSCOPY     esophegeal dilation        Home Medications    Prior to Admission medications   Medication Sig Start Date End Date Taking? Authorizing Provider  acetaminophen (TYLENOL) 500 MG tablet Take 500 mg by mouth every 8 (eight) hours as needed for mild pain or moderate pain.     [provider]  albuterol (PROAIR HFA) 108 (90 BASE) MCG/ACT inhaler Inhale 2 puffs into the lungs every 4 (four) hours as needed for wheezing or shortness of breath (and prior to exercise). 06/13/14   Collene Gobble, MD  AMBULATORY NON FORMULARY MEDICATION Apply 1 application topically daily. Testosterone cream 150mg /15% Place  15% onto the skin daily     [provider]  B Complex Vitamins (VITAMIN B COMPLEX PO) Take 1 tablet by mouth daily.    [provider]  BYSTOLIC 10 MG tablet TAKE 1 TABLET BY MOUTH EVERY DAY Patient taking differently: Take 10 mg by mouth daily.  02/28/19   Larey Dresser, MD  clopidogrel (PLAVIX) 75 MG tablet Take 1 tablet (75 mg total) by mouth daily with breakfast. 03/28/19   Clegg, Amy D, NP  dicyclomine (BENTYL) 10 MG capsule Take 1 capsule (10 mg total) by mouth 3 (three) times daily before meals. Patient taking differently: Take 10 mg by mouth 3 (three) times daily as needed for spasms.  01/04/19   Ladene Artist, MD  diltiazem (  CARDIZEM CD) 120 MG 24 hr capsule TAKE ONE CAPSULE BY MOUTH ONCE EACH DAY. Patient taking differently: Take 120 mg by mouth daily.  03/27/18   Burtis Junes, NP  dofetilide (TIKOSYN) 250 MCG capsule TAKE 1 CAPSULE BY MOUTH EVERY 12 HOURS *NEED OFFICE VISIT* Patient taking differently: Take 250 mcg by mouth every 12 (twelve) hours. (0800 & 2000) 12/25/18   Larey Dresser, MD  ezetimibe (ZETIA) 10 MG tablet TAKE 1 TABLET BY MOUTH EVERY DAY Patient taking differently: Take 10 mg by mouth daily at 8 pm.  05/01/19   Larey Dresser, MD  famotidine (PEPCID) 40 MG tablet Take 1 tablet (40 mg total) by mouth at bedtime. 01/04/19   Ladene Artist, MD  furosemide (LASIX) 20 MG tablet Take 1 tablet (20 mg total) by mouth daily. 06/05/19   Larey Dresser, MD  HYDROcodone-acetaminophen (NORCO) 5-325 MG tablet Take 1 tablet by mouth every 6 (six) hours as needed for moderate pain. Patient not taking: Reported on 06/05/2019 05/09/19   Dagoberto Ligas, PA-C  lactobacillus acidophilus & bulgar (LACTINEX) chewable tablet Chew 2 tablets by mouth daily. (0800)    [provider]  loperamide (IMODIUM) 2 MG capsule Take 2 mg by mouth 2 (two) times daily as needed for diarrhea or loose stools.     [provider]  LORazepam (ATIVAN) 0.5 MG tablet Take  0.5 mg by mouth at bedtime as needed for sleep.     [provider]  losartan (COZAAR) 25 MG tablet Take 0.5 tablets (12.5 mg total) by mouth daily. 04/09/19   Larey Dresser, MD  methocarbamol (ROBAXIN) 500 MG tablet Take 500 mg by mouth 3 (three) times daily as needed for muscle spasms.     [provider]  metoprolol tartrate (LOPRESSOR) 50 MG tablet Take 1 tablet (50 mg total) by mouth once for 1 dose. Two hours prior to CT testing. 05/31/19 05/31/19  Camnitz, Ocie Doyne, MD  nitroGLYCERIN (NITROSTAT) 0.4 MG SL tablet Place 1 tablet (0.4 mg total) under the tongue every 5 (five) minutes as needed for chest pain. 03/27/19 03/26/20  Clegg, Amy D, NP  potassium chloride SA (K-DUR,KLOR-CON) 20 MEQ tablet Take 20 mEq by mouth at bedtime.     [provider]  Probiotic Product (ALIGN) 4 MG CAPS Take 4 mg by mouth daily.    [provider]  rosuvastatin (CRESTOR) 40 MG tablet TAKE 1 TABLET BY MOUTH EVERY DAY Patient taking differently: Take 40 mg by mouth daily at 8 pm.  05/23/19   Larey Dresser, MD  sodium chloride (OCEAN) 0.65 % SOLN nasal spray Place 1 spray into both nostrils as needed for congestion.    [provider]  traZODone (DESYREL) 50 MG tablet Take 50 mg by mouth at bedtime.    [provider]  Vitamin D, Ergocalciferol, (DRISDOL) 50000 UNITS CAPS capsule Take 50,000 Units by mouth every 14 (fourteen) days. Take on 1st and 15th    [provider]  XARELTO 15 MG TABS tablet TAKE 1 TABLET EVERY EVENING Patient taking differently: Take 15 mg by mouth at bedtime.  03/02/19   Larey Dresser, MD    Family History Family History  Problem Relation Age of Onset  . Heart disease Mother   . Heart disease Father   . Heart Problems Brother        heart transplant  . Heart disease Maternal Grandmother   . Heart disease Maternal Grandfather   .  Heart disease Paternal Grandmother   . Heart disease Paternal Grandfather   . Prostate  cancer Paternal Uncle   . Cancer Son        thymic cancer.  died at age 21  . Colon cancer Neg Hx   . Esophageal cancer Neg Hx   . Pancreatic cancer Neg Hx   . Rectal cancer Neg Hx   . Stomach cancer Neg Hx     Social History Social History   Tobacco Use  . Smoking status: Former Smoker    Packs/day: 0.50    Years: 5.00    Pack years: 2.50    Types: Cigarettes    Quit date: 12/27/1966    Years since quitting: 52.4  . Smokeless tobacco: Former Systems developer    Quit date: 1950  Substance Use Topics  . Alcohol use: No  . Drug use: No     Allergies   Morphine and related and Tape   Review of Systems Review of Systems   Physical Exam Updated Vital Signs There were no vitals taken for this visit.  Physical Exam Vitals signs and nursing note reviewed.  Constitutional:      Appearance: Normal appearance. He is normal weight.  HENT:     Head: Normocephalic.     Right Ear: External ear normal.     Left Ear: External ear normal.     Nose: Nose normal.     Mouth/Throat:     Mouth: Mucous membranes are moist.  Eyes:     Pupils: Pupils are equal, round, and reactive to light.  Neck:     Musculoskeletal: Normal range of motion.  Cardiovascular:     Rate and Rhythm: Normal rate and regular rhythm.  Pulmonary:     Effort: Pulmonary effort is normal.  Abdominal:     General: Abdomen is flat. Bowel sounds are normal.     Palpations: Abdomen is soft.  Musculoskeletal: Normal range of motion.     Comments: Blood oozing from left groin access site with some surrounding ecchymosis No pulsating mass No ttp or erythema Pulses intact distal to site  Right site with bleeding controlled   Skin:    General: Skin is warm.     Capillary Refill: Capillary refill takes less than 2 seconds.  Neurological:     General: No focal deficit present.     Mental Status: He is alert.  Psychiatric:        Mood and Affect: Mood normal.      ED Treatments / Results  Labs (all labs ordered  are listed, but only abnormal results are displayed) Labs Reviewed - No data to display  EKG    Radiology No results found.  Procedures Procedures (including critical care time)  Medications Ordered in ED Medications - No data to display   Initial Impression / Assessment and Plan / ED Course  I have reviewed the triage vital signs and the nursing notes.  Pertinent labs & imaging results that were available during my care of the patient were reviewed by me and considered in my medical decision making (see chart for details).      surgical site continues to ooze.  Hemodynamically stable.  Pulses intact.  Pressure in place for one hour.  Will place combat gauze Patient has some continued oozing with blood noted on bandage but no active bleeding.  We will continue combat gauze and pressure dressing at home.  Patient advised that he will need to with pressure dressing in  place.  He is advised to return if he has any increased bleeding or has not resolved in 24 hours.  Patient voices understanding of plan No evidence of pseudoaneurysm, hematoma, arterial bleeding.  Good pulses intact in lower extremity.  Final Clinical Impressions(s) / ED Diagnoses   Final diagnoses:  Status post cardiac catheterization  Venous bleed    ED Discharge Orders    None       Pattricia Boss, MD 06/09/19 1339    Pattricia Boss, MD 07/05/19 1346

## 2019-06-09 NOTE — ED Notes (Signed)
Sandbag applied to L groin by ortho tech

## 2019-06-09 NOTE — ED Triage Notes (Signed)
Pt from home for evaluation of bleeding from surgical site; pt had a cardiac ablation performed yesterday, L groin site continues to ooze blood; hx afib, pt on plavix and xarelto; pt has taken neither today, scheduled to back on them tomorrow; no pain or swelling to site

## 2019-06-09 NOTE — Discharge Instructions (Addendum)
Please stay in recumbent position (laying down on back) with pressure dressing in place. Return if increased bleeding, pain, weakness, or lightheadedness

## 2019-06-09 NOTE — Progress Notes (Signed)
Orthopedic Tech Progress Note Patient Details:  [name] 19-Nov-1936 [id number]  Ortho Devices Ortho Device/Splint Location: sand bag for bleeding Ortho Device/Splint Interventions: Application   Post Interventions Patient Tolerated: Well   [name] 06/09/2019, 12:12 PM

## 2019-06-09 NOTE — Telephone Encounter (Signed)
    Patient's wife called to report he underwent an ablation yesterday with no immediate complications but overnight he had bleeding along his left groin site. They packed the area and applied pressure as previously instructed for several hours with no improvement. He is already en route to [facility] ED. Informed them I was in agreement with this given persistent bleeding.   Signed, Erma Heritage, PA-C 06/09/2019, 9:02 AM Pager: [phone]

## 2019-06-09 NOTE — ED Notes (Addendum)
Quick clot gauze dressing applied to left groin puncture, pressure dressing applied and sandbag replaced.

## 2019-06-11 ENCOUNTER — Encounter (HOSPITAL_COMMUNITY): Payer: Self-pay | Admitting: Cardiology

## 2019-06-18 ENCOUNTER — Telehealth (HOSPITAL_COMMUNITY): Payer: Self-pay | Admitting: *Deleted

## 2019-06-18 NOTE — Telephone Encounter (Signed)
Patient's wife called in stating patient is feeling very fatigued, some dizziness, looks pale and having increasing back pain. Back pain has slowly spread from one side to bilateral. He took tylenol and a muscle relaxer he had but did not feel any relief from this. He was able to work today but not feeling great. BP has been running from 90-1115/50s HR 59-78. Discussed with Roderic Palau NP - will bring in tomorrow morning for assessment if any increase in pain or dizziness pt should report to ER. IF BP is low tomorrow morning before the appt he will cut his bystolic in half tomorrow. Wife verbalized understanding.

## 2019-06-19 ENCOUNTER — Encounter (HOSPITAL_COMMUNITY): Payer: Self-pay | Admitting: Physician Assistant

## 2019-06-19 ENCOUNTER — Other Ambulatory Visit: Payer: Self-pay

## 2019-06-19 ENCOUNTER — Ambulatory Visit (HOSPITAL_COMMUNITY)
Admission: RE | Admit: 2019-06-19 | Discharge: 2019-06-19 | Disposition: A | Discharge status: Home / Self Care | Payer: Medicare Other | Source: Ambulatory Visit | Attending: Nurse Practitioner | Admitting: Nurse Practitioner

## 2019-06-19 VITALS — Ht 66.0 in | Wt 126.0 lb

## 2019-06-19 DIAGNOSIS — E785 Hyperlipidemia, unspecified: Secondary | ICD-10-CM | POA: Diagnosis not present

## 2019-06-19 DIAGNOSIS — Z7901 Long term (current) use of anticoagulants: Secondary | ICD-10-CM | POA: Diagnosis not present

## 2019-06-19 DIAGNOSIS — Z7902 Long term (current) use of antithrombotics/antiplatelets: Secondary | ICD-10-CM | POA: Diagnosis not present

## 2019-06-19 DIAGNOSIS — Z79899 Other long term (current) drug therapy: Secondary | ICD-10-CM | POA: Diagnosis not present

## 2019-06-19 DIAGNOSIS — Z91048 Other nonmedicinal substance allergy status: Secondary | ICD-10-CM | POA: Diagnosis not present

## 2019-06-19 DIAGNOSIS — J449 Chronic obstructive pulmonary disease, unspecified: Secondary | ICD-10-CM | POA: Diagnosis not present

## 2019-06-19 DIAGNOSIS — G4733 Obstructive sleep apnea (adult) (pediatric): Secondary | ICD-10-CM | POA: Insufficient documentation

## 2019-06-19 DIAGNOSIS — I110 Hypertensive heart disease with heart failure: Secondary | ICD-10-CM | POA: Diagnosis not present

## 2019-06-19 DIAGNOSIS — Z802 Family history of malignant neoplasm of other respiratory and intrathoracic organs: Secondary | ICD-10-CM | POA: Diagnosis not present

## 2019-06-19 DIAGNOSIS — Z8673 Personal history of transient ischemic attack (TIA), and cerebral infarction without residual deficits: Secondary | ICD-10-CM | POA: Insufficient documentation

## 2019-06-19 DIAGNOSIS — I252 Old myocardial infarction: Secondary | ICD-10-CM | POA: Insufficient documentation

## 2019-06-19 DIAGNOSIS — Z886 Allergy status to analgesic agent status: Secondary | ICD-10-CM | POA: Diagnosis not present

## 2019-06-19 DIAGNOSIS — Z951 Presence of aortocoronary bypass graft: Secondary | ICD-10-CM | POA: Insufficient documentation

## 2019-06-19 DIAGNOSIS — K219 Gastro-esophageal reflux disease without esophagitis: Secondary | ICD-10-CM | POA: Diagnosis not present

## 2019-06-19 DIAGNOSIS — I484 Atypical atrial flutter: Secondary | ICD-10-CM | POA: Diagnosis not present

## 2019-06-19 DIAGNOSIS — I25810 Atherosclerosis of coronary artery bypass graft(s) without angina pectoris: Secondary | ICD-10-CM | POA: Insufficient documentation

## 2019-06-19 DIAGNOSIS — Z8249 Family history of ischemic heart disease and other diseases of the circulatory system: Secondary | ICD-10-CM | POA: Insufficient documentation

## 2019-06-19 DIAGNOSIS — I5032 Chronic diastolic (congestive) heart failure: Secondary | ICD-10-CM | POA: Insufficient documentation

## 2019-06-19 DIAGNOSIS — Z87891 Personal history of nicotine dependence: Secondary | ICD-10-CM | POA: Insufficient documentation

## 2019-06-19 DIAGNOSIS — I4819 Other persistent atrial fibrillation: Secondary | ICD-10-CM | POA: Insufficient documentation

## 2019-06-19 LAB — BASIC METABOLIC PANEL
Anion gap: 6 (ref 5–15)
BUN: 30 mg/dL — ABNORMAL HIGH (ref 8–23)
CO2: 27 mmol/L (ref 22–32)
Calcium: 10 mg/dL (ref 8.9–10.3)
Chloride: 105 mmol/L (ref 98–111)
Creatinine, Ser: 1.84 mg/dL — ABNORMAL HIGH (ref 0.61–1.24)
GFR calc Af Amer: 39 mL/min — ABNORMAL LOW (ref >60)
GFR calc non Af Amer: 34 mL/min — ABNORMAL LOW (ref >60)
Glucose, Bld: 101 mg/dL — ABNORMAL HIGH (ref 70–99)
Potassium: 4.2 mmol/L (ref 3.5–5.1)
Sodium: 138 mmol/L (ref 135–145)

## 2019-06-19 LAB — CBC
HCT: 38 % — ABNORMAL LOW (ref 39.0–52.0)
Hemoglobin: 12.4 g/dL — ABNORMAL LOW (ref 13.0–17.0)
MCH: 32.6 pg (ref 26.0–34.0)
MCHC: 32.6 g/dL (ref 30.0–36.0)
MCV: 100 fL (ref 80.0–100.0)
Platelets: 163 10*3/uL (ref 150–400)
RBC: 3.8 MIL/uL — ABNORMAL LOW (ref 4.22–5.81)
RDW: 13 % (ref 11.5–15.5)
WBC: 7.2 10*3/uL (ref 4.0–10.5)
nRBC: 0 % (ref 0.0–0.2)

## 2019-06-19 MED ORDER — NEBIVOLOL HCL 10 MG PO TABS: 5.0000 mg | ORAL_TABLET | Freq: Every day | ORAL | 1 refills | Status: DC

## 2019-06-19 NOTE — Progress Notes (Signed)
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 The patient was initially diagnosed with atrial fibrillation several years ago and is s/p afib ablation 06/25/14 at [facility] by Dr. Lehman Prom and has been maintained on Tikosyn since. He has had some possible a fib, atrial flutter vs ectopic atrial rhythm. On 12/07/18, he was noted to be in an ectopic atrial rhythm with 2:1 block and was cardioverted on 12/11/18. On review of his ECGs, it appears he had some transient AV dissociation post cardioversion but is back in SR today. He has been asymptomatic. He denies bleeding issues with anticoagulation. He has OSA and struggles with using his CPAP. He sees Dr Radford Pax for this.  F/u in afib clinic 2/4 at request of Dr. Radford Pax . He saw Dr.Turner yesterday for intolerance to biPap and she found him to be out of rhythm and referred here. He remains on Tikosyn but ekg today appears to be atypical  atrial flutter. He is not symptomatic with this.  F/u in AF clinic 06/19/19. Patient is s/p repeat ablation with Dr Curt Bears. Had afib ablation but atrial flutter to complex to be ablated. Patient has been to ER since then with complaint of bleeding from the left groin site on 06/09/19. On 06/18/19 patient called with dizziness and fatigue with BP 61P-509T systolic and bilateral back pain. He cut his bystolic in half this AM. Today, he reports that he feels better and his is in SR. He denies bleeding from his groin sites although he does still have bruising. He is not orthostatic. He denies chest pain or swallowing issues.  Today, he denies symptoms of chest pain, shortness of breath, orthopnea, PND, lower extremity edema,  dizziness, presyncope, syncope, snoring, daytime somnolence, bleeding, or neurologic sequela. The patient is tolerating medications without difficulties and is otherwise without complaint today.    Atrial Fibrillation Risk Factors:  he does have symptoms or diagnosis of sleep apnea. he is compliant with OSA therapy. Now on oral appliance.    he has a BMI of Body mass index is 20.34 kg/m.[name] Filed Weights   06/19/19 0852  Weight: 57.2 kg     Atrial Fibrillation Management history:  Previous antiarrhythmic drugs: Tikosyn Previous cardioversions: 12/11/18 Previous ablations: 2015 at [facility], 06/08/19 CHADS2VASC score: 6 (age, HTN, CAD, CVA) Anticoagulation history: Xarelto   Past Medical History:  Diagnosis Date  . Abnormal nuclear cardiac imaging test March 2011   Has positive EKG response, no perfusion defect and normal EF  . Adenomatous colon polyp 12/2002  . Atrial fibrillation ([place])    a. s/p rfca;  b. chronic tikosyn and xarelto.  . 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 Cataract    bil cataracts removed  . Chronic diastolic CHF (congestive heart failure) ([place])    a. 04/2017 Echo: EF 65-70%, restrictive physiology, mildly dil LA.  [name] Complication of anesthesia   . COPD (chronic obstructive pulmonary disease) ([place])   . Coronary artery disease    First MI in 1988. PCI in 1996 with subsequent CABG in 1996 due to restenosis. S/P stents to LCX in 2009. Noted to have residual disease in the LAD in a diffuse manner and atretic LIMA  graft. He is managed medically.   . Difficult intubation    06/08/19 update - Intubated with MAC3 with grade IIb view 7.0 tube passed easily  . Dysrhythmia    Afib  . Esophageal stricture   . GERD (gastroesophageal reflux disease)   . 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 History of Raynaud's syndrome   . Hyperlipidemia   . Hypertension   . Internal hemorrhoids   . Muscle spasm   .  Myocardial infarction [facility])    MI x1 29 - 70 age  . Neuromuscular disorder ([place])    Polio - age 76  . OSA (obstructive sleep apnea) 10/16/2018   Severe OSA with AHI 37.2/hr on BiPAP  . Persistent atrial fibrillation   . Polio    age 58  . PVC (premature ventricular contraction)   . Scoliosis    mild   Past Surgical History:  Procedure Laterality Date  . ABLATION     done for a fib  . APPENDECTOMY     Age 59  . ATRIAL FIBRILLATION ABLATION N/A 06/08/2019   Procedure: ATRIAL FIBRILLATION ABLATION;  Surgeon: Constance Haw, MD;  Location: [place] [facility];  Service: Cardiovascular;  Laterality: N/A;  . CARDIOVERSION N/A 08/22/2013   Procedure: CARDIOVERSION;  Surgeon: Carlena Bjornstad, MD;  Location: [facility] ENDOSCOPY;  Service: Cardiovascular;  Laterality: N/A;  . CARDIOVERSION N/A 01/16/2014   Procedure: CARDIOVERSION;  Surgeon: Lelon Perla, MD;  Location: [facility] ENDOSCOPY;  Service: Cardiovascular;  Laterality: N/A;  . CARDIOVERSION N/A 12/11/2018   Procedure: CARDIOVERSION;  Surgeon: Larey Dresser, MD;  Location: [facility] ENDOSCOPY;  Service: Cardiovascular;  Laterality: N/A;  . COLONOSCOPY  2008  . CORONARY ARTERY BYPASS GRAFT  1996   with a LIMA to the LAD, SVG to RCA and SVG to OM  . CORONARY STENT INTERVENTION N/A 03/26/2019   Procedure: CORONARY STENT INTERVENTION;  Surgeon: Jettie Booze, MD;  Location: [place] [facility];  Service: Cardiovascular;  Laterality: N/A;  . CORONARY STENT PLACEMENT  1996   LCX  . EMBOLECTOMY Right 05/09/2019   Procedure: Embolectomy of right radial artery;  Surgeon: Serafina Mitchell, MD;  Location: [place];  Service: Vascular;  Laterality: Right;  . ESOPHAGEAL DILATION    . EYE SURGERY    . FALSE ANEURYSM REPAIR Right 05/09/2019   Procedure: REPAIR FALSE ANEURYSM RIGHT RADIAL;  Surgeon: Serafina Mitchell, MD;  Location: [place];  Service: Vascular;  Laterality: Right;  . LEFT HEART CATH AND CORS/GRAFTS ANGIOGRAPHY N/A 03/26/2019   Procedure: LEFT  HEART CATH AND CORS/GRAFTS ANGIOGRAPHY;  Surgeon: Larey Dresser, MD;  Location: [place] [facility];  Service: Cardiovascular;  Laterality: N/A;  . POLYPECTOMY    . TONSILLECTOMY    . UPPER GASTROINTESTINAL ENDOSCOPY     esophegeal dilation    Current Outpatient Medications  Medication Sig Dispense Refill  . acetaminophen (TYLENOL) 500 MG tablet Take 500 mg by mouth every 8 (eight) hours as needed for mild pain or moderate pain.     [name] albuterol (PROAIR HFA) 108 (90 BASE) MCG/ACT inhaler Inhale 2 puffs into the lungs every 4 (four) hours as needed for wheezing or shortness of breath (and prior to exercise). 1 Inhaler 3  . AMBULATORY NON FORMULARY MEDICATION Apply 1 application topically See admin instructions. Testosterone cream 150mg /15% Place 1 ml  onto the skin daily    . BYSTOLIC 10 MG tablet TAKE 1 TABLET BY MOUTH EVERY DAY (  Patient taking differently: Take 10 mg by mouth daily. ) 90 tablet 1  . clopidogrel (PLAVIX) 75 MG tablet Take 1 tablet (75 mg total) by mouth daily with breakfast. 30 tablet 6  . dicyclomine (BENTYL) 10 MG capsule Take 1 capsule (10 mg total) by mouth 3 (three) times daily before meals. (Patient taking differently: Take 10 mg by mouth 3 (three) times daily as needed for spasms. ) 90 capsule 11  . diltiazem (CARDIZEM CD) 120 MG 24 hr capsule TAKE ONE CAPSULE BY MOUTH ONCE EACH DAY. (Patient taking differently: Take 120 mg by mouth daily. ) 90 capsule 3  . dofetilide (TIKOSYN) 250 MCG capsule TAKE 1 CAPSULE BY MOUTH EVERY 12 HOURS *NEED OFFICE VISIT* (Patient taking differently: Take 250 mcg by mouth every 12 (twelve) hours. (0800 & 2000)) 180 capsule 3  . ezetimibe (ZETIA) 10 MG tablet TAKE 1 TABLET BY MOUTH EVERY DAY (Patient taking differently: Take 10 mg by mouth daily at 8 pm. ) 90 tablet 3  . famotidine (PEPCID) 40 MG tablet Take 1 tablet (40 mg total) by mouth at bedtime. 30 tablet 11  . furosemide (LASIX) 20 MG tablet Take 1 tablet (20 mg total) by mouth daily.  30 tablet 6  . lactobacillus acidophilus & bulgar (LACTINEX) chewable tablet Chew 2 tablets by mouth daily. (0800)    . loperamide (IMODIUM) 2 MG capsule Take 2 mg by mouth 2 (two) times daily as needed for diarrhea or loose stools.     [name] LORazepam (ATIVAN) 0.5 MG tablet Take 0.5 mg by mouth at bedtime as needed for sleep.     [name] losartan (COZAAR) 25 MG tablet Take 0.5 tablets (12.5 mg total) by mouth daily. 45 tablet 3  . methocarbamol (ROBAXIN) 500 MG tablet Take 500 mg by mouth 3 (three) times daily as needed for muscle spasms.     . nitroGLYCERIN (NITROSTAT) 0.4 MG SL tablet Place 1 tablet (0.4 mg total) under the tongue every 5 (five) minutes as needed for chest pain. 25 tablet 0  . potassium chloride SA (K-DUR,KLOR-CON) 20 MEQ tablet Take 20 mEq by mouth at bedtime.     . Probiotic Product (ALIGN) 4 MG CAPS Take 4 mg by mouth daily.    . rosuvastatin (CRESTOR) 40 MG tablet TAKE 1 TABLET BY MOUTH EVERY DAY (Patient taking differently: Take 40 mg by mouth daily at 8 pm. ) 90 tablet 3  . sodium chloride (OCEAN) 0.65 % SOLN nasal spray Place 1 spray into both nostrils as needed for congestion.    . traZODone (DESYREL) 50 MG tablet Take 50 mg by mouth at bedtime.    . Vitamin D, Ergocalciferol, (DRISDOL) 50000 UNITS CAPS capsule Take 50,000 Units by mouth every 14 (fourteen) days. Take on 1st and 15th    . XARELTO 15 MG TABS tablet TAKE 1 TABLET EVERY EVENING (Patient taking differently: Take 15 mg by mouth at bedtime. ) 90 tablet 1   No current facility-administered medications for this encounter.     Allergies  Allergen Reactions  . Morphine And Related Other (See Comments)    IV forms- vein irritation  . Tape Other (See Comments)    Skin Irritation    Social History   Socioeconomic History  . Marital status: Married    Spouse name: Not on file  . Number of children: 2  . Years of education: Not on file  . Highest education level: Not on file  Occupational History  . Occupation:  Software engineer  Social Needs  . Financial resource strain: Not on file  . Food insecurity    Worry: Not on file    Inability: Not on file  . Transportation needs    Medical: Not on file    Non-medical: Not on file  Tobacco Use  . Smoking status: Former Smoker    Packs/day: 0.50    Years: 5.00    Pack years: 2.50    Types: Cigarettes    Quit date: 12/27/1966    Years since quitting: 52.5  . Smokeless tobacco: Former Systems developer    Quit date: 1950  Substance and Sexual Activity  . Alcohol use: No  . Drug use: No  . Sexual activity: Yes  Lifestyle  . Physical activity    Days per week: Not on file    Minutes per session: Not on file  . Stress: Not on file  Relationships  . Social Herbalist on phone: Not on file    Gets together: Not on file    Attends religious service: Not on file    Active member of club or organization: Not on file    Attends meetings of clubs or organizations: Not on file    Relationship status: Not on file  . Intimate partner violence    Fear of current or ex partner: Not on file    Emotionally abused: Not on file    Physically abused: Not on file    Forced sexual activity: Not on file  Other Topics Concern  . 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 Heart disease Mother   . Heart disease Father   . Heart Problems Brother        heart transplant  . Heart disease Maternal Grandmother   . Heart disease Maternal Grandfather   . Heart disease Paternal Grandmother   . Heart disease Paternal Grandfather   . Prostate cancer Paternal Uncle   . Cancer Son        thymic cancer.  died at age 41  . Colon cancer Neg Hx   . Esophageal cancer Neg Hx   . Pancreatic cancer Neg Hx   . Rectal cancer Neg Hx   . Stomach cancer Neg Hx     ROS- All systems are reviewed and negative except as per the HPI above.   Physical Exam: Vitals:   06/19/19 0852  Weight: 57.2 kg  Height: 5\' 6"  (1.676 m)    GEN- The patient is well appearing, alert and oriented x 3 today.   HEENT-head normocephalic, atraumatic, sclera clear, conjunctiva pink, hearing intact, trachea midline. Lungs- Clear to ausculation bilaterally, normal work of breathing Heart- Regular rate and rhythm, 2/6 systolic murmur, no rubs or gallops  GI- soft, NT, ND, + BS Extremities- no clubbing, cyanosis, or edema. Groin ecchymosis.  MS- no significant deformity or atrophy Skin- no rash or lesion Psych- euthymic mood, full affect Neuro- strength and sensation are intact   Wt Readings from Last 3 Encounters:  06/19/19 57.2 kg  06/09/19 56.5 kg  06/08/19 56.2 kg    EKG today demonstrates SR HR 86, NST, PR 150, QRS 116, QTc 476  Echo 03/26/19 demonstrated   1. The left ventricle has mildly reduced systolic function, with an ejection fraction of 45-50%. The cavity size was mildly dilated. Left  ventricular diastolic Doppler parameters are indeterminate.  2. Posterior lateral and inferior basal hypokinesis.  3. The right ventricle has normal systolic function. The cavity was normal. There is no increase in right ventricular wall thickness.  4. Left atrial size was mildly dilated.  5. Right atrial size was mildly dilated.  6. The mitral valve is degenerative. Moderate thickening of the mitral valve leaflet. Mild calcification of the mitral valve leaflet. There is mild mitral annular calcification present.  7. Tricuspid valve regurgitation is mild-moderate.  8. The aortic valve is tricuspid. Moderate thickening of the aortic valve. Moderate sclerosis of the aortic valve.   Assessment and Plan:  1. Persistent atrial fibrillation The patient has asymptomatic persistent atypical atrial flutter/ectopic rhythm   S/p  successful DCCV on 12/11/18. Transient AV dissociation afterwards, but in SR with normal qtc on follow up. S/p repeat afib  ablation with Dr Curt Bears 06/08/19. Per procedure report, ablation unable to be performed due to complexity of atrial flutter. In SR today. CBC stable, no further bleeding. Continue Xarelto 15 mg BID Continue dofetilide 250 mcg BID. QT stable Continue diltiazem 120 mg daily Continue Bystolic 5 mg daily for now. We also discussed wearable device technology to track arrhythmia burden.  This patients CHA2DS2-VASc Score and unadjusted Ischemic Stroke Rate (% per year) is equal to 9.7 % stroke rate/year from a score of 6  Above score calculated as 1 point each if present [CHF, HTN, DM, Vascular=MI/PAD/Aortic Plaque, Age if 65-74, or Male] Above score calculated as 2 points each if present [Age > 75, or Stroke/TIA/TE]   2. Obstructive sleep apnea The importance of adequate treatment of sleep apnea was discussed today in order to improve our ability to maintain sinus rhythm long term. He is now on an oral appliance.  3. CAD No anginal symptoms. Continue present therapy.  4. HTN Stable, changes as above.  Follow up in AF clinic and with Dr Curt Bears as scheduled.  [facility] [address] [place], [place] [address] [phone]

## 2019-06-20 ENCOUNTER — Telehealth (HOSPITAL_COMMUNITY): Payer: Self-pay | Admitting: *Deleted

## 2019-06-20 NOTE — Telephone Encounter (Signed)
  Dr.  Aundra Dubin   As you are aware our department remains closed to patients due to Covid-19.  We are excited to be able to offer an alternative to traditional onsite Cardiac Rehab while your patient continues to follow Re-Open guidelines.  This is a notification that your patient has been contacted and is very interested in participating in Virtual Cardiac Rehab.  Thank you for your continued support in helping [place] meet the health care needs of our patients.  Barnet Pall RN  Cardiac Rehab staff

## 2019-06-21 ENCOUNTER — Telehealth (HOSPITAL_COMMUNITY): Payer: Self-pay | Admitting: *Deleted

## 2019-06-22 ENCOUNTER — Other Ambulatory Visit (HOSPITAL_COMMUNITY): Payer: Self-pay

## 2019-06-22 MED ORDER — DILTIAZEM HCL ER COATED BEADS 120 MG PO CP24: ORAL_CAPSULE | ORAL | 3 refills | Status: DC

## 2019-06-25 ENCOUNTER — Other Ambulatory Visit (HOSPITAL_COMMUNITY): Payer: Self-pay

## 2019-06-25 ENCOUNTER — Encounter (HOSPITAL_COMMUNITY): Payer: Medicare Other | Admitting: Cardiology

## 2019-06-25 ENCOUNTER — Other Ambulatory Visit (HOSPITAL_COMMUNITY): Payer: Self-pay | Admitting: *Deleted

## 2019-06-25 MED ORDER — POTASSIUM CHLORIDE CRYS ER 20 MEQ PO TBCR: 20.0000 meq | EXTENDED_RELEASE_TABLET | Freq: Every day | ORAL | 3 refills | Status: DC

## 2019-07-05 ENCOUNTER — Other Ambulatory Visit: Payer: Self-pay

## 2019-07-05 ENCOUNTER — Ambulatory Visit (HOSPITAL_COMMUNITY)
Admission: RE | Admit: 2019-07-05 | Discharge: 2019-07-05 | Disposition: A | Discharge status: Home / Self Care | Payer: Medicare Other | Source: Ambulatory Visit | Attending: Cardiology | Admitting: Cardiology

## 2019-07-05 DIAGNOSIS — I729 Aneurysm of unspecified site: Secondary | ICD-10-CM

## 2019-07-05 DIAGNOSIS — I480 Paroxysmal atrial fibrillation: Secondary | ICD-10-CM

## 2019-07-05 DIAGNOSIS — I5032 Chronic diastolic (congestive) heart failure: Secondary | ICD-10-CM

## 2019-07-05 DIAGNOSIS — I5022 Chronic systolic (congestive) heart failure: Secondary | ICD-10-CM | POA: Diagnosis not present

## 2019-07-05 MED ORDER — FUROSEMIDE 40 MG PO TABS: 40.0000 mg | ORAL_TABLET | Freq: Every day | ORAL | 6 refills | Status: DC

## 2019-07-05 NOTE — Progress Notes (Signed)
Heart Failure TeleHealth Note  Date:  07/05/2019   ID:  [name], DOB July 11, 1936, MRN [id number]  Location: Home  Provider location: [facility] Type of Visit: Established patient   PCP:  Burnard Bunting, MD  Cardiologist:  [name]  Chief Complaint: Shortness of breath   History of Present Illness: [name] is a 83 y.o. male who presents for followup of CAD and CHF.     he denies symptoms worrisome for COVID 19.   Patient has history of CAD s/p CABG.  Cath in 11/09 showed that the SVG-distal RCA was patent, the CFX system was patent.  His LIMA was atretic and there were serial 60% and 80% stenoses in the native LAD.  He was managed medically.  Echo in 8/14 showed EF 55-60% with moderate MR and moderate TR.   He was initially noted to have atrial fibrillation in the summer of 2014. He was started on Xarelto and cardioverted to NSR in 8/14.  Recurrent atrial fibrillation was noted in 1/15, and he was cardioverted to NSR again.  This time, NSR did not hold long. By 5/15, he was in persistent atrial fibrillation.  I referred him to [facility] where he had atrial fibrillation ablation and Tikosyn loading.  Ranolazine was stopped due to risk of QT prolongation and Imdur was started as an anti-anginal instead.  Lexiscan Cardiolite in 11/15 showed no ischemia.    CTA chest done in 2/16 to look for evidence for PV stenosis post-AF ablation.  This showed mild short-segment narrowing of the left inferior pulmonary vein.   Patient was admitted in 5/18 with acute on chronic diastolic CHF.  He had been [place] the beach for [place] days and ate out a number of times, probably getting a significant sodium load.  No chest pain.  He was in normal sinus rhythm.  He was started on IV Lasix and diuresed.  Echo in 5/18 showed EF 65-70% with moderately dilated RV and normal systolic function, PASP 57 mmHg.  Lexiscan Cardiolite in 6/18 showed no significant perfusion defect.   He  was admitted with NSTEMI in 3/20, had DES to ostial LAD and mid SVG-RCA.  Echo showed EF 45-50%.  He was in atypical atrial flutter persistently despite Tikosyn.  After his intervention, he was noted to have a radial artery pseudoaneurysm that was repaired by Dr. Trula Slade.   In 6/20, he had a redo atrial fibrillation ablation.  He was also noted to have complex flutter from multiple foci that was not ablated.  After the procedure, he has had considerable pain in the right leg at the cath site but also radiating down the leg.    He says that he has felt worse since the ablation.  His HR is in the 50s.  He has no energy.  Lasix was decreased to 20 mg daily with elevated creatinine and nebivolol was decreased to 5 mg daily with low BP.  Weight is up 4 lbs.  He is now short of breath after walking 25 yards.  No chest pain.  No orthopnea.  He has significant peripheral edema now.   Labs (8/12): K 4.1, creatinine 1.2, LDL 91, HDL 53 Labs (8/14): K 4.6, creatinine 1.1 Labs (11/14): K 4.6, creatinine 1.2, LDL 91, HDL 56 Labs (3/15): AST 38, ALT 32, TSH normal, BNP 237 Labs (7/15): LDL 96, HDL 48, K 4.2, creatinine 1.2, TSH normal, BNP 237, AST 38, ALT 32 Labs (8/15): LFTs normal  Labs (11/15): K 4.2, creatinine 1.1, Mg 2.2, BNP 227 Labs (2/16): K 4, creatinine 1.32, BNP 261 Labs (6/18): K 3.9, creatinine 1.6 Labs (7/18): K 3.9, creatinine 1.5, BNP 242 Labs (12/18): K 3.8, creatinine 1.4, hgb 13.9, LDL 73, HDL 52 Labs (3/19): K 3.7, creatinine 1.4, LDL 76, HDL 42 Labs (6/19): K 4.1, creatinine 1.63 Labs (3/20): K 4.2, creatinine 1.29, LDL 53, HDL 47, hgb 13.8 Labs (4/20): K 4.3, creatinine 1.5 Labs (6/20): K 4.2, creatinine 1.84, hgb 12.4  PMH: 1. CAD: 1st MI in 1988.  CABG 1996.  PCI to CFX in 2009.  LHC (11/09) SVG-dRCA patent, total occlusion RCA, patent CFX stent, atretic LIMA, serial 60 and 80% proximal LAD stenoses, EF 60% with basal inferior hypokinesis.  Myoview in 2011 with no ischemia or  infarction.  Echo (10/12) with EF 55-60%, mild LVH, mild MR.  Lexiscan Cardiolite in 2013 with no ischemia or infarction.  Echo (8/14) with EF 55-60%, moderate MR, moderate TR, PA systolic pressure 35 mmHg.  Echo (4/15) with EF 60-65%, mild focal basal septal hypertrophy, inferior basal akinesis, mild MR.  Lexiscan cardiolite (11/15) with EF 61%, fixed basal inferoseptal defect, no ischemia.  - Lexiscan Cardiolite (6/18): EF 54%, no perfusion defect.  - NSTEMI 3/20, cath showed 95% ostial/proximal LAD, 60-70% mid LAD, totally occluded SVG-LCx, totally occluded RCA, 80-90% mid SVG-RCA.  Patient had DES to ostial-mid LAD and DES to SVG-RCA.  2. Raynauds syndrome 3. Post-polio syndrome 4. GERD with dilation of esophageal stricture in 12/12.  5. Hyperlipidemia 6. Brainstem stroke with Horner's syndrome.  7. Scoliosis. 8. H/o appendectomy 9. Herpes Zoster 10. Atrial fibrillation: DCCV to NSR in 8/14. DCCV to NSR in 1/15. Atrial fibrillation ablation 6/15 with Tikosyn loading (at [facility]).   - Redo atrial fibrillation ablation in 6/20.  Patient also noted to have complex flutter with several foci, not ablated.  11. PFTs (4/15) with FVC 59%, FEV1 54%, ratio 91%, DLCO 53% => moderate obstructive defect thought to be related to COPD and severe restrictive defect thought to be due to elevated left hemidiaphragm and post-polio syndrome.  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  Echo (5/18) with EF 65-70%, RV moderately dilated with normal systolic function, PASP 57 mmHg.  - Echo (3/20): EF 45-50%, mild LV dilation, basal inferolateral and inferior hypokinesis.  13. CKD: Stage 3.  14. OSA: dental appliance.  15. Right radial artery pseudoaneurysm: post-cath in 3/20, s/p repair.    Current Outpatient Medications  Medication Sig Dispense Refill  . acetaminophen (TYLENOL) 500 MG tablet Take 500 mg by mouth every 8 (eight) hours as needed for mild pain or moderate pain.     [name] albuterol (PROAIR HFA) 108 (90 BASE) MCG/ACT  inhaler Inhale 2 puffs into the lungs every 4 (four) hours as needed for wheezing or shortness of breath (and prior to exercise). 1 Inhaler 3  . AMBULATORY NON FORMULARY MEDICATION Apply 1 application topically See admin instructions. Testosterone cream 150mg /15% Place 1 ml  onto the skin daily    . clopidogrel (PLAVIX) 75 MG tablet Take 1 tablet (75 mg total) by mouth daily with breakfast. 30 tablet 6  . dicyclomine (BENTYL) 10 MG capsule Take 1 capsule (10 mg total) by mouth 3 (three) times daily before meals. (Patient taking differently: Take 10 mg by mouth 3 (three) times daily as needed for spasms. ) 90 capsule 11  . dofetilide (TIKOSYN) 250 MCG capsule TAKE 1 CAPSULE BY MOUTH EVERY 12 HOURS *NEED OFFICE VISIT* (Patient taking differently: Take  250 mcg by mouth every 12 (twelve) hours. (0800 & 2000)) 180 capsule 3  . ezetimibe (ZETIA) 10 MG tablet TAKE 1 TABLET BY MOUTH EVERY DAY (Patient taking differently: Take 10 mg by mouth daily at 8 pm. ) 90 tablet 3  . famotidine (PEPCID) 40 MG tablet Take 1 tablet (40 mg total) by mouth at bedtime. 30 tablet 11  . furosemide (LASIX) 40 MG tablet Take 1 tablet (40 mg total) by mouth daily. 30 tablet 6  . lactobacillus acidophilus & bulgar (LACTINEX) chewable tablet Chew 2 tablets by mouth daily. (0800)    . loperamide (IMODIUM) 2 MG capsule Take 2 mg by mouth 2 (two) times daily as needed for diarrhea or loose stools.     [name] LORazepam (ATIVAN) 0.5 MG tablet Take 0.5 mg by mouth at bedtime as needed for sleep.     . methocarbamol (ROBAXIN) 500 MG tablet Take 500 mg by mouth 3 (three) times daily as needed for muscle spasms.     . nebivolol (BYSTOLIC) 10 MG tablet Take 0.5 tablets (5 mg total) by mouth daily. 90 tablet 1  . nitroGLYCERIN (NITROSTAT) 0.4 MG SL tablet Place 1 tablet (0.4 mg total) under the tongue every 5 (five) minutes as needed for chest pain. 25 tablet 0  . potassium chloride SA (K-DUR) 20 MEQ tablet Take 1 tablet (20 mEq total) by mouth at  bedtime. 90 tablet 3  . Probiotic Product (ALIGN) 4 MG CAPS Take 4 mg by mouth daily.    . rosuvastatin (CRESTOR) 40 MG tablet TAKE 1 TABLET BY MOUTH EVERY DAY (Patient taking differently: Take 40 mg by mouth daily at 8 pm. ) 90 tablet 3  . sodium chloride (OCEAN) 0.65 % SOLN nasal spray Place 1 spray into both nostrils as needed for congestion.    . traZODone (DESYREL) 50 MG tablet Take 50 mg by mouth at bedtime.    . Vitamin D, Ergocalciferol, (DRISDOL) 50000 UNITS CAPS capsule Take 50,000 Units by mouth every 14 (fourteen) days. Take on 1st and 15th    . XARELTO 15 MG TABS tablet TAKE 1 TABLET EVERY EVENING (Patient taking differently: Take 15 mg by mouth at bedtime. ) 90 tablet 1   No current facility-administered medications for this encounter.     Allergies:   Morphine and related and Tape   Social History:  The patient  reports that he quit smoking about 52 years ago. His smoking use included cigarettes. He has a 2.50 pack-year smoking history. He quit smokeless tobacco use about 70 years ago. He reports that he does not drink alcohol or use drugs.   Family History:  The patient's family history includes Cancer in his son; Heart Problems in his brother; Heart disease in his father, maternal grandfather, maternal grandmother, mother, paternal grandfather, and paternal grandmother; Prostate cancer in his paternal uncle.   ROS:  Please see the history of present illness.   All other systems are personally reviewed and negative.   Exam:   BP 120/50, HR 50 General: Speaks in full sentences, no distress evident.  Neck: JVP 8-9 cm Lungs: Normal respirations while speaking.  Abdomen: Non-distended, no tenderness to self-palpation.  Extremities: 1+ edema 1/2 to knees bilaterally   Recent Labs: 12/15/2018: Magnesium 2.4 01/04/2019: ALT 26 06/19/2019: BUN 30; Creatinine, Ser 1.84; Hemoglobin 12.4; Platelets 163; Potassium 4.2; Sodium 138  Personally reviewed   Wt Readings from Last 3  Encounters:  06/19/19 57.2 kg (126 lb)  06/09/19 56.5 kg (124 lb  9.6 oz)  06/08/19 56.2 kg (124 lb)      ASSESSMENT AND PLAN:  1. Atrial fibrillation: He has been on Tikosyn and is s/p atrial fibrillation ablation at [facility] on 06/25/14.  Recently, he has been in and out of atypical atrial flutter. He had a redo atrial fibrillation ablation in 6/20.  Complex flutter was noted from several foci and was not ablated.  Since the ablation, he says that he has felt much worse and seems to have developed worsening CHF.  He says that his HR is around 50.  He may be back in an atrial arrhythmia with slow response.  - Continue Tikosyn.  He will come by the office for BMET, Mg, and ECG to follow QT interval.  - Continue Xarelto.  - With slow HR, he will stop diltiazem CD. Continue nebivolol.   - I will have him wear a Zio patch to assess HR and rhythm over a 3 day period.  - If he is out of rhythm on ECG, would plan DCCV to try to get him back into NSR.   2. CAD: H/o CABG.  He had NSTEMI in 3/20 with DES to ostial/proximal LAD and SVG-RCA.  No further chest pain.  - He has stopped ASA.   - Continue Plavix at least 6 months, ideally 1 year.  - He will continue Xarelto 15 mg daily long-term for atrial fibrillation/flutter.  - he is on statin and Zetia.  3. Hyperlipidemia: He is taking Crestor 40 mg daily and Zetia 10 mg daily. Good lipids in 3/20.     4. Ischemic cardiomyopathy/primarily diastolic CHF: Echo in 9/37 with EF 45-50%.  Weight is up 4 lbs, NYHA class III symptoms (worse) with volume overload on exam.  Lasix was recently decreased with rise in creatinine.  I question if recurrent atrial arrhythmia has worsened CHF => as above, will have ECG and Zio patch.  - Increase Lasix back to 40 mg daily.  BMET today and again in 10 days.  - Continue nebivolol 5 mg daily.  - Stop losartan with increasing Lasix and higher creatinine.    5. Pulmonary: PFTs suggested both a restrictive and obstructive defect.   The restrictive defect is likely from post-polio syndrome and elevated left hemidiaphragm.  Mr Saini never smoked much, so the obstructive defect is more surprising.  6. OSA: Severe, likely potentiates his atrial fibrillation. He has had a hard time tolerating CPAP.   - Now using an oral appliance.   7. Right radial artery pseudoaneurysm: s/p repair.  8. Right groin cath site pain: Pain also radiates down his leg.   - I will arrange for vascular ultrasound of arterial and venous structures in the groin to assess for complications from afib ablation.    COVID screen The patient does not have any symptoms that suggest any further testing/ screening at this time.  Social distancing reinforced today.  Relevant cardiac medications were reviewed at length with the patient today.   The patient does not have concerns regarding their medications at this time.   Recommended follow-up:  3-4 wks in office.   Today, I have spent 31 minutes with the patient with telehealth technology discussing the above issues .    Signed, [name], MD  07/05/2019  [place] [address] [facility] [place] [address] [phone] (office) [phone] (fax)

## 2019-07-05 NOTE — Patient Instructions (Addendum)
INCREASE Lasix to 40mg  daily  STOP Dilitiazam and Losartan  EKG and Labs 07/06/19 at 10:15a (with RN) We will only contact you if something comes back abnormal or we need to make some changes. Otherwise no news is good news!  An ultrasound has been ordered to assess your right groin for an abscess (accumulation of fluid) at the puncture site from your procedure in June.  You are scheduled for 07/06/19 at 11am in the radiology department.  Our office will call you with the results.   Repeat labs in 2 weeks: Thursday, July 23rd, 2020 at 10am We will only contact you if something comes back abnormal or we need to make some changes. Otherwise no news is good news!  Your provider has recommended that  you wear a Zio Patch for 3 days.  This monitor will record your heart rhythm for our review.  IF you have any symptoms while wearing the monitor please press the button.  If you have any issues with the patch or you notice a red or orange light on it please call the company at [phone].  Once you remove the patch please mail it back to the company as soon as possible so we can get the results.   Your physician recommends that you schedule a follow-up appointment in: 3-4 weeks with Dr Aundra Dubin  At the [facility], you and your health needs are our priority. As part of our continuing mission to provide you with exceptional heart care, we have created designated Provider Care Teams. These Care Teams include your primary Cardiologist (physician) and Advanced Practice Providers (APPs- Physician Assistants and Nurse Practitioners) who all work together to provide you with the care you need, when you need it.   You may see any of the following providers on your designated Care Team at your next follow up: [name] Dr Glori Bickers . Dr [name] . Darrick Grinder, NP

## 2019-07-05 NOTE — Progress Notes (Signed)
Spoke with patient and wife, reviewed all instructions from call with Dr Aundra Dubin.  Pt and wife verbalized understanding and will receive paper copy of AVS tomorrow at nursing visit. They were appreciative of call.

## 2019-07-06 ENCOUNTER — Ambulatory Visit (HOSPITAL_COMMUNITY)
Admission: RE | Admit: 2019-07-06 | Discharge: 2019-07-06 | Disposition: A | Discharge status: Home / Self Care | Payer: Medicare Other | Source: Ambulatory Visit | Attending: Physician Assistant | Admitting: Physician Assistant

## 2019-07-06 ENCOUNTER — Encounter (HOSPITAL_COMMUNITY): Payer: Self-pay

## 2019-07-06 ENCOUNTER — Ambulatory Visit (HOSPITAL_COMMUNITY)
Admission: RE | Admit: 2019-07-06 | Discharge: 2019-07-06 | Disposition: A | Discharge status: Home / Self Care | Payer: Medicare Other | Source: Ambulatory Visit | Attending: Cardiology | Admitting: Cardiology

## 2019-07-06 ENCOUNTER — Ambulatory Visit (HOSPITAL_BASED_OUTPATIENT_CLINIC_OR_DEPARTMENT_OTHER)
Admission: RE | Admit: 2019-07-06 | Discharge: 2019-07-06 | Disposition: A | Discharge status: Home / Self Care | Payer: Medicare Other | Source: Ambulatory Visit | Attending: Cardiology | Admitting: Cardiology

## 2019-07-06 ENCOUNTER — Other Ambulatory Visit: Payer: Self-pay

## 2019-07-06 ENCOUNTER — Encounter (HOSPITAL_COMMUNITY): Payer: Self-pay | Admitting: Physician Assistant

## 2019-07-06 VITALS — BP 126/58 | HR 54 | Ht 66.0 in | Wt 126.0 lb

## 2019-07-06 VITALS — BP 126/58 | HR 54 | Wt 126.0 lb

## 2019-07-06 DIAGNOSIS — Z8249 Family history of ischemic heart disease and other diseases of the circulatory system: Secondary | ICD-10-CM | POA: Diagnosis not present

## 2019-07-06 DIAGNOSIS — Z87891 Personal history of nicotine dependence: Secondary | ICD-10-CM | POA: Insufficient documentation

## 2019-07-06 DIAGNOSIS — G4733 Obstructive sleep apnea (adult) (pediatric): Secondary | ICD-10-CM | POA: Diagnosis not present

## 2019-07-06 DIAGNOSIS — Z951 Presence of aortocoronary bypass graft: Secondary | ICD-10-CM | POA: Insufficient documentation

## 2019-07-06 DIAGNOSIS — I729 Aneurysm of unspecified site: Secondary | ICD-10-CM | POA: Diagnosis not present

## 2019-07-06 DIAGNOSIS — Z7902 Long term (current) use of antithrombotics/antiplatelets: Secondary | ICD-10-CM | POA: Diagnosis not present

## 2019-07-06 DIAGNOSIS — I493 Ventricular premature depolarization: Secondary | ICD-10-CM

## 2019-07-06 DIAGNOSIS — I4819 Other persistent atrial fibrillation: Secondary | ICD-10-CM | POA: Diagnosis not present

## 2019-07-06 DIAGNOSIS — K219 Gastro-esophageal reflux disease without esophagitis: Secondary | ICD-10-CM | POA: Insufficient documentation

## 2019-07-06 DIAGNOSIS — I252 Old myocardial infarction: Secondary | ICD-10-CM | POA: Insufficient documentation

## 2019-07-06 DIAGNOSIS — I484 Atypical atrial flutter: Secondary | ICD-10-CM | POA: Diagnosis not present

## 2019-07-06 DIAGNOSIS — Z79899 Other long term (current) drug therapy: Secondary | ICD-10-CM | POA: Diagnosis not present

## 2019-07-06 DIAGNOSIS — T81718A Complication of other artery following a procedure, not elsewhere classified, initial encounter: Secondary | ICD-10-CM | POA: Diagnosis not present

## 2019-07-06 DIAGNOSIS — Z955 Presence of coronary angioplasty implant and graft: Secondary | ICD-10-CM | POA: Diagnosis not present

## 2019-07-06 DIAGNOSIS — Z885 Allergy status to narcotic agent status: Secondary | ICD-10-CM | POA: Insufficient documentation

## 2019-07-06 DIAGNOSIS — I2510 Atherosclerotic heart disease of native coronary artery without angina pectoris: Secondary | ICD-10-CM | POA: Diagnosis not present

## 2019-07-06 DIAGNOSIS — I10 Essential (primary) hypertension: Secondary | ICD-10-CM | POA: Insufficient documentation

## 2019-07-06 DIAGNOSIS — I255 Ischemic cardiomyopathy: Secondary | ICD-10-CM | POA: Diagnosis not present

## 2019-07-06 DIAGNOSIS — J449 Chronic obstructive pulmonary disease, unspecified: Secondary | ICD-10-CM | POA: Insufficient documentation

## 2019-07-06 DIAGNOSIS — E785 Hyperlipidemia, unspecified: Secondary | ICD-10-CM | POA: Diagnosis not present

## 2019-07-06 DIAGNOSIS — I4891 Unspecified atrial fibrillation: Secondary | ICD-10-CM | POA: Diagnosis present

## 2019-07-06 NOTE — Progress Notes (Signed)
Zio patch placed onto patient.  All instructions and information reviewed with patient, they verbalize understanding with no questions.   Pt had EKG done in Afib visit. Labs done. Pt wants results of labs sent to [place].

## 2019-07-06 NOTE — Progress Notes (Signed)
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 The patient was initially diagnosed with atrial fibrillation several years ago and is s/p afib ablation 06/25/14 at [facility] by Dr. Lehman Prom and has been maintained on Tikosyn since. He has had some possible a fib, atrial flutter vs ectopic atrial rhythm. On 12/07/18, he was noted to be in an ectopic atrial rhythm with 2:1 block and was cardioverted on 12/11/18. On review of his ECGs, it appears he had some transient AV dissociation post cardioversion but is back in SR on follow up. He has been asymptomatic. He denies bleeding issues with anticoagulation. He has OSA and struggles with using his CPAP. He is now on an oral device and follows with Dr Radford Pax and Dr Toy Cookey.    Patient is s/p repeat afib ablation 06/08/19 with Dr Curt Bears. Patient reports that he has had some dizziness and he continues to have right leg pain with hip flexion. He also reports that he has had some low BP readings at home. He recently had a visit with Dr Aundra Dubin who stopped his losartan and diltiazem. Patient reports the he does feel a little better today. He is in SR today. Of note, groin ultrasound ordered by Dr Aundra Dubin scheduled for today. He denies chest pain or swallowing issues.  Today, he denies symptoms of chest pain, shortness of breath, orthopnea, PND, lower extremity edema, dizziness, presyncope, syncope, snoring, daytime somnolence, bleeding, or neurologic sequela. The patient is tolerating medications without difficulties and is otherwise without complaint today.    Atrial Fibrillation Risk Factors:  he does have symptoms or diagnosis of sleep apnea. he is compliant  with OSA therapy. Now on oral appliance.    he has a BMI of Body mass index is 20.34 kg/m.[name] Filed Weights   07/06/19 0939  Weight: 57.2 kg     Atrial Fibrillation Management history:  Previous antiarrhythmic drugs: Tikosyn (avoid amio given abn PFTs) Previous cardioversions: 12/11/18 Previous ablations: 2015 at [facility], 06/08/19 CHADS2VASC score: 6 (age, HTN, CAD, CVA) Anticoagulation history: Xarelto   Past Medical History:  Diagnosis Date  . Abnormal nuclear cardiac imaging test March 2011   Has positive EKG response, no perfusion defect and normal EF  . Adenomatous colon polyp 12/2002  . Atrial fibrillation ([place])    a. s/p rfca;  b. chronic tikosyn and xarelto.  . 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 Cataract    bil cataracts removed  . Chronic diastolic CHF (congestive heart failure) ([place])    a. 04/2017 Echo: EF 65-70%, restrictive physiology, mildly dil LA.  [name] Complication of anesthesia   . COPD (chronic obstructive pulmonary disease) ([place])   . Coronary artery disease    First MI in 1988. PCI in 1996 with subsequent CABG in 1996 due to restenosis. S/P stents to LCX in 2009. Noted to have residual disease in the LAD in a diffuse manner and atretic LIMA graft. He is managed medically.   . Difficult intubation    06/08/19 update - Intubated with MAC3 with grade IIb view 7.0 tube passed easily  . Dysrhythmia    Afib  . Esophageal stricture   . GERD (gastroesophageal reflux disease)   .  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 History of Raynaud's syndrome   . Hyperlipidemia   . Hypertension   . Internal hemorrhoids   . Muscle spasm   . Myocardial infarction [facility])    MI x1 37 - 71 age  . Neuromuscular disorder ([place])    Polio - age 44  . OSA (obstructive sleep apnea) 10/16/2018   Severe OSA with AHI 37.2/hr on BiPAP  . Persistent atrial fibrillation   . Polio    age 30  . PVC (premature ventricular  contraction)   . Scoliosis    mild   Past Surgical History:  Procedure Laterality Date  . ABLATION     done for a fib  . APPENDECTOMY     Age 33  . ATRIAL FIBRILLATION ABLATION N/A 06/08/2019   Procedure: ATRIAL FIBRILLATION ABLATION;  Surgeon: Constance Haw, MD;  Location: [place] CV LAB;  Service: Cardiovascular;  Laterality: N/A;  . CARDIOVERSION N/A 08/22/2013   Procedure: CARDIOVERSION;  Surgeon: Carlena Bjornstad, MD;  Location: [facility] ENDOSCOPY;  Service: Cardiovascular;  Laterality: N/A;  . CARDIOVERSION N/A 01/16/2014   Procedure: CARDIOVERSION;  Surgeon: Lelon Perla, MD;  Location: [facility] ENDOSCOPY;  Service: Cardiovascular;  Laterality: N/A;  . CARDIOVERSION N/A 12/11/2018   Procedure: CARDIOVERSION;  Surgeon: Larey Dresser, MD;  Location: [facility] ENDOSCOPY;  Service: Cardiovascular;  Laterality: N/A;  . COLONOSCOPY  2008  . CORONARY ARTERY BYPASS GRAFT  1996   with a LIMA to the LAD, SVG to RCA and SVG to OM  . CORONARY STENT INTERVENTION N/A 03/26/2019   Procedure: CORONARY STENT INTERVENTION;  Surgeon: Jettie Booze, MD;  Location: [place] CV LAB;  Service: Cardiovascular;  Laterality: N/A;  . CORONARY STENT PLACEMENT  1996   LCX  . EMBOLECTOMY Right 05/09/2019   Procedure: Embolectomy of right radial artery;  Surgeon: Serafina Mitchell, MD;  Location: [place];  Service: Vascular;  Laterality: Right;  . ESOPHAGEAL DILATION    . EYE SURGERY    . FALSE ANEURYSM REPAIR Right 05/09/2019   Procedure: REPAIR FALSE ANEURYSM RIGHT RADIAL;  Surgeon: Serafina Mitchell, MD;  Location: [place];  Service: Vascular;  Laterality: Right;  . LEFT HEART CATH AND CORS/GRAFTS ANGIOGRAPHY N/A 03/26/2019   Procedure: LEFT HEART CATH AND CORS/GRAFTS ANGIOGRAPHY;  Surgeon: Larey Dresser, MD;  Location: [place] CV LAB;  Service: Cardiovascular;  Laterality: N/A;  . POLYPECTOMY    . TONSILLECTOMY    . UPPER GASTROINTESTINAL ENDOSCOPY     esophegeal dilation    Current Outpatient  Medications  Medication Sig Dispense Refill  . acetaminophen (TYLENOL) 500 MG tablet Take 500 mg by mouth every 8 (eight) hours as needed for mild pain or moderate pain.     [name] albuterol (PROAIR HFA) 108 (90 BASE) MCG/ACT inhaler Inhale 2 puffs into the lungs every 4 (four) hours as needed for wheezing or shortness of breath (and prior to exercise). 1 Inhaler 3  . AMBULATORY NON FORMULARY MEDICATION Apply 1 application topically See admin instructions. Testosterone cream 150mg /15% Place 1 ml  onto the skin daily    . clopidogrel (PLAVIX) 75 MG tablet Take 1 tablet (75 mg total) by mouth daily with breakfast. 30 tablet 6  . dicyclomine (BENTYL) 10 MG capsule Take 1 capsule (10 mg total) by mouth 3 (three) times daily before meals. (Patient taking differently: Take 10 mg by mouth 3 (three) times daily as needed for spasms. ) 90 capsule  11  . dofetilide (TIKOSYN) 250 MCG capsule TAKE 1 CAPSULE BY MOUTH EVERY 12 HOURS *NEED OFFICE VISIT* (Patient taking differently: Take 250 mcg by mouth every 12 (twelve) hours. (0800 & 2000)) 180 capsule 3  . ezetimibe (ZETIA) 10 MG tablet TAKE 1 TABLET BY MOUTH EVERY DAY (Patient taking differently: Take 10 mg by mouth daily at 8 pm. ) 90 tablet 3  . famotidine (PEPCID) 40 MG tablet Take 1 tablet (40 mg total) by mouth at bedtime. 30 tablet 11  . furosemide (LASIX) 40 MG tablet Take 1 tablet (40 mg total) by mouth daily. 30 tablet 6  . lactobacillus acidophilus & bulgar (LACTINEX) chewable tablet Chew 2 tablets by mouth daily. (0800)    . loperamide (IMODIUM) 2 MG capsule Take 2 mg by mouth 2 (two) times daily as needed for diarrhea or loose stools.     [name] LORazepam (ATIVAN) 0.5 MG tablet Take 0.5 mg by mouth at bedtime as needed for sleep.     . methocarbamol (ROBAXIN) 500 MG tablet Take 500 mg by mouth 3 (three) times daily as needed for muscle spasms.     . nebivolol (BYSTOLIC) 10 MG tablet Take 0.5 tablets (5 mg total) by mouth daily. 90 tablet 1  . nitroGLYCERIN  (NITROSTAT) 0.4 MG SL tablet Place 1 tablet (0.4 mg total) under the tongue every 5 (five) minutes as needed for chest pain. 25 tablet 0  . potassium chloride SA (K-DUR) 20 MEQ tablet Take 1 tablet (20 mEq total) by mouth at bedtime. 90 tablet 3  . Probiotic Product (ALIGN) 4 MG CAPS Take 4 mg by mouth daily.    . rosuvastatin (CRESTOR) 40 MG tablet TAKE 1 TABLET BY MOUTH EVERY DAY (Patient taking differently: Take 40 mg by mouth daily at 8 pm. ) 90 tablet 3  . sodium chloride (OCEAN) 0.65 % SOLN nasal spray Place 1 spray into both nostrils as needed for congestion.    . traZODone (DESYREL) 50 MG tablet Take 50 mg by mouth at bedtime.    . Vitamin D, Ergocalciferol, (DRISDOL) 50000 UNITS CAPS capsule Take 50,000 Units by mouth every 14 (fourteen) days. Take on 1st and 15th    . XARELTO 15 MG TABS tablet TAKE 1 TABLET EVERY EVENING (Patient taking differently: Take 15 mg by mouth at bedtime. ) 90 tablet 1   No current facility-administered medications for this encounter.     Allergies  Allergen Reactions  . Morphine And Related Other (See Comments)    IV forms- vein irritation  . Tape Other (See Comments)    Skin Irritation    Social History   Socioeconomic History  . Marital status: Married    Spouse name: Not on file  . Number of children: 2  . Years of education: Not on file  . Highest education level: Not on file  Occupational History  . Occupation: Software engineer  Social Needs  . Financial resource strain: Not on file  . Food insecurity    Worry: Not on file    Inability: Not on file  . Transportation needs    Medical: Not on file    Non-medical: Not on file  Tobacco Use  . Smoking status: Former Smoker    Packs/day: 0.50    Years: 5.00    Pack years: 2.50    Types: Cigarettes    Quit date: 12/27/1966    Years since quitting: 52.5  . Smokeless tobacco: Former Systems developer    Quit date: 1950  Substance  and Sexual Activity  . Alcohol use: No  . Drug use: No  . Sexual activity:  Yes  Lifestyle  . Physical activity    Days per week: Not on file    Minutes per session: Not on file  . Stress: Not on file  Relationships  . Social Herbalist on phone: Not on file    Gets together: Not on file    Attends religious service: Not on file    Active member of club or organization: Not on file    Attends meetings of clubs or organizations: Not on file    Relationship status: Not on file  . Intimate partner violence    Fear of current or ex partner: Not on file    Emotionally abused: Not on file    Physically abused: Not on file    Forced sexual activity: Not on file  Other Topics Concern  . 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 Heart disease Mother   . Heart disease Father   . Heart Problems Brother        heart transplant  . Heart disease Maternal Grandmother   . Heart disease Maternal Grandfather   . Heart disease Paternal Grandmother   . Heart disease Paternal Grandfather   . Prostate cancer Paternal Uncle   . Cancer Son        thymic cancer.  died at age 20  . Colon cancer Neg Hx   . Esophageal cancer Neg Hx   . Pancreatic cancer Neg Hx   . Rectal cancer Neg Hx   . Stomach cancer Neg Hx     ROS- All systems are reviewed and negative except as per the HPI above.  Physical Exam: Vitals:   07/06/19 0939  BP: (!) 126/58  Pulse: (!) 54  Weight: 57.2 kg  Height: 5\' 6"  (1.676 m)    GEN- The patient is well appearing elderly male, alert and oriented x 3 today.   HEENT-head normocephalic, atraumatic, sclera clear, conjunctiva pink, hearing intact, trachea midline. Lungs- Clear to ausculation bilaterally, normal work of breathing Heart- Regular rate and rhythm, no murmurs, rubs or gallops  GI- soft, NT, ND, + BS Extremities- no clubbing, cyanosis, or edema MS- no significant deformity or atrophy  Skin- no rash or lesion Psych- euthymic mood, full affect Neuro- strength and sensation are intact    Wt Readings from Last 3 Encounters:  07/06/19 57.2 kg  07/06/19 57.2 kg  06/19/19 57.2 kg    EKG today demonstrates SB HR 54, NST, PR 132, QRS 106, QTc 453  Echo 03/26/19 demonstrated   1. The left ventricle has mildly reduced systolic function, with an ejection fraction of 45-50%. The cavity size was mildly dilated. Left ventricular diastolic Doppler parameters are indeterminate.  2. Posterior lateral and inferior basal hypokinesis.  3. The right ventricle has normal systolic function. The cavity was normal. There is no increase in right ventricular wall thickness.  4. Left atrial size was mildly dilated.  5. Right atrial size was mildly dilated.  6. The mitral valve is degenerative. Moderate thickening of the mitral valve leaflet. Mild calcification of the mitral valve leaflet. There is mild mitral annular calcification present.  7. Tricuspid valve regurgitation is mild-moderate.  8. The aortic valve is tricuspid. Moderate thickening  of the aortic valve. Moderate sclerosis of the aortic valve.   Assessment and Plan:  1. Persistent atrial fibrillation/atypical atrial flutter S/p  successful DCCV on 12/11/18. Transient AV dissociation afterwards, but in SR with normal qtc on follow up. S/p repeat afib ablation with Dr Curt Bears 06/08/19. Per procedure report, ablation unable to be performed due to complexity of atrial flutter. In SR again today. Unclear if arrhythmia is contributing to his symptoms. Agree with Zio patch to evaluate arrhythmia burden. Continue Xarelto 15 mg BID Continue dofetilide 250 mcg BID. QT stable Continue Bystolic 5 mg daily Groin ultrasound pending.  This patients CHA2DS2-VASc Score and unadjusted Ischemic Stroke Rate (% per year) is equal to 9.7 % stroke rate/year from a score of 6  Above score calculated as 1 point each if present [CHF, HTN, DM,  Vascular=MI/PAD/Aortic Plaque, Age if 65-74, or Male] Above score calculated as 2 points each if present [Age > 75, or Stroke/TIA/TE]   2. Obstructive sleep apnea The importance of adequate treatment of sleep apnea was discussed today in order to improve our ability to maintain sinus rhythm long term. He continues to do well with his oral appliance. He follows with Dr Toy Cookey.  3. CAD No anginal symptoms. Continue present therapy.   4. HTN Stable, diltiazem and losartan recently stopped per Dr Aundra Dubin. No changes today.  5. Ischemic CM Weight appears back to baseline today. No signs of fluid overload. Continue present therapy.    Follow up with Dr Aundra Dubin and Dr Curt Bears as scheduled.   [facility] [address] [place], [place] [address] [phone]

## 2019-07-06 NOTE — Progress Notes (Signed)
Lower extremity Arterial duplex has been completed.   Preliminary results in CV Proc.   Abram Sander 07/06/2019 11:16 AM

## 2019-07-09 ENCOUNTER — Telehealth (HOSPITAL_COMMUNITY): Payer: Self-pay | Admitting: *Deleted

## 2019-07-09 ENCOUNTER — Telehealth (HOSPITAL_COMMUNITY): Payer: Self-pay

## 2019-07-09 NOTE — Telephone Encounter (Signed)
-----   Message from Larey Dresser, MD sent at 07/09/2019 10:06 AM EDT ----- Regarding: RE: Ok to participate in on site cardiac rehab Covid risk score 7 Yes he can participate ----- Message ----- From: Rowe Pavy, RN Sent: 07/09/2019   9:56 AM EDT To: Larey Dresser, MD Subject: Ok to participate in on site cardiac rehab C#  Greetings [name],  We are now seeing patients on-site for cardiac rehab . A great deal of planning with advisement from our Medical Director - Dr. Radford Pax, CV Service line leadership, Infection Disease Control, Facilities, security, recommendations from American Association of Cardiac and Pulmonary Rehab (AACVPR) with the goal for optimal patient safety. Patients will have strict guidelines and criteria they must adhere to and follow. Patients will wear a mask during exercise and practice social distancing. Patients will have to complete screening prior to entry into gym area.   Your patient expressed great interest in participating in facility cardiac rehab. Patient has a COVID-19 risk score of 7. Do you feel this patient is appropriate to resume exercise in facility cardiac rehab? Any additional restrictions you feel are appropriate for this patient?    Thank you and we appreciate your input  Maurice Small RN, BSN Cardiac and Pulmonary Rehab Nurse Navigator   Cardiac Rehab Staff

## 2019-07-10 ENCOUNTER — Telehealth (HOSPITAL_COMMUNITY): Payer: Self-pay

## 2019-07-10 ENCOUNTER — Other Ambulatory Visit (HOSPITAL_COMMUNITY): Payer: Self-pay

## 2019-07-10 DIAGNOSIS — I5032 Chronic diastolic (congestive) heart failure: Secondary | ICD-10-CM

## 2019-07-10 LAB — BASIC METABOLIC PANEL
Anion gap: 12 (ref 5–15)
BUN: 28 mg/dL — ABNORMAL HIGH (ref 8–23)
CO2: 23 mmol/L (ref 22–32)
Calcium: 10.7 mg/dL — ABNORMAL HIGH (ref 8.9–10.3)
Chloride: 103 mmol/L (ref 98–111)
Creatinine, Ser: 2.53 mg/dL — ABNORMAL HIGH (ref 0.61–1.24)
GFR calc Af Amer: 27 mL/min — ABNORMAL LOW (ref >60)
GFR calc non Af Amer: 23 mL/min — ABNORMAL LOW (ref >60)
Glucose, Bld: 85 mg/dL (ref 70–99)
Potassium: 4.7 mmol/L (ref 3.5–5.1)
Sodium: 138 mmol/L (ref 135–145)

## 2019-07-10 LAB — MAGNESIUM: Magnesium: 2.7 mg/dL — ABNORMAL HIGH (ref 1.7–2.4)

## 2019-07-10 MED ORDER — FUROSEMIDE 40 MG PO TABS: 20.0000 mg | ORAL_TABLET | Freq: Every day | ORAL | 6 refills | Status: DC

## 2019-07-10 NOTE — Telephone Encounter (Signed)
Pt aware of results of ultrasound

## 2019-07-10 NOTE — Addendum Note (Signed)
Encounter addended by: Valeda Malm, RN on: 07/10/2019 9:51 AM  Actions taken: Order list changed, Diagnosis association updated

## 2019-07-10 NOTE — Telephone Encounter (Signed)
Spoke with patients wife, reviewed lab results and recommendations. Pt started taking 20mg  yesterday on his own.  Advised to not take lasix for 3 days and continue at 20mg  daily. Will rtc in 1 week for bmet. Verbalized understanding.  Lab results faxed to them as requested

## 2019-07-10 NOTE — Telephone Encounter (Signed)
-----   Message from Larey Dresser, MD sent at 07/10/2019  3:57 PM EDT ----- Creatinine is up considerably.  Stop Lasix x 3 days then decrease back to 20 mg daily.  BMET in 1 week.

## 2019-07-10 NOTE — Telephone Encounter (Signed)
-----   Message from Larey Dresser, MD sent at 07/06/2019  4:03 PM EDT ----- No pseudoaneurysm or AV fistula at right groin cath site.

## 2019-07-16 DIAGNOSIS — R002 Palpitations: Secondary | ICD-10-CM | POA: Diagnosis not present

## 2019-07-17 ENCOUNTER — Other Ambulatory Visit: Payer: Self-pay

## 2019-07-17 ENCOUNTER — Ambulatory Visit (HOSPITAL_COMMUNITY)
Admission: RE | Admit: 2019-07-17 | Discharge: 2019-07-17 | Disposition: A | Discharge status: Home / Self Care | Payer: Medicare Other | Source: Ambulatory Visit | Attending: Cardiology | Admitting: Cardiology

## 2019-07-17 DIAGNOSIS — I5032 Chronic diastolic (congestive) heart failure: Secondary | ICD-10-CM | POA: Insufficient documentation

## 2019-07-17 LAB — BASIC METABOLIC PANEL
Anion gap: 9 (ref 5–15)
BUN: 21 mg/dL (ref 8–23)
CO2: 25 mmol/L (ref 22–32)
Calcium: 10.2 mg/dL (ref 8.9–10.3)
Chloride: 102 mmol/L (ref 98–111)
Creatinine, Ser: 1.72 mg/dL — ABNORMAL HIGH (ref 0.61–1.24)
GFR calc Af Amer: 42 mL/min — ABNORMAL LOW (ref >60)
GFR calc non Af Amer: 36 mL/min — ABNORMAL LOW (ref >60)
Glucose, Bld: 92 mg/dL (ref 70–99)
Potassium: 4.5 mmol/L (ref 3.5–5.1)
Sodium: 136 mmol/L (ref 135–145)

## 2019-07-19 ENCOUNTER — Other Ambulatory Visit (HOSPITAL_COMMUNITY): Payer: Medicare Other

## 2019-08-07 ENCOUNTER — Other Ambulatory Visit: Payer: Self-pay

## 2019-08-07 ENCOUNTER — Encounter (HOSPITAL_COMMUNITY): Payer: Self-pay | Admitting: Cardiology

## 2019-08-07 ENCOUNTER — Ambulatory Visit (HOSPITAL_COMMUNITY)
Admission: RE | Admit: 2019-08-07 | Discharge: 2019-08-07 | Disposition: A | Discharge status: Home / Self Care | Payer: Medicare Other | Source: Ambulatory Visit | Attending: Cardiology | Admitting: Cardiology

## 2019-08-07 VITALS — BP 140/70 | HR 88 | Wt 116.0 lb

## 2019-08-07 DIAGNOSIS — M419 Scoliosis, unspecified: Secondary | ICD-10-CM | POA: Insufficient documentation

## 2019-08-07 DIAGNOSIS — R9431 Abnormal electrocardiogram [ECG] [EKG]: Secondary | ICD-10-CM | POA: Diagnosis not present

## 2019-08-07 DIAGNOSIS — Z8673 Personal history of transient ischemic attack (TIA), and cerebral infarction without residual deficits: Secondary | ICD-10-CM | POA: Insufficient documentation

## 2019-08-07 DIAGNOSIS — Z885 Allergy status to narcotic agent status: Secondary | ICD-10-CM | POA: Diagnosis not present

## 2019-08-07 DIAGNOSIS — Z955 Presence of coronary angioplasty implant and graft: Secondary | ICD-10-CM | POA: Diagnosis not present

## 2019-08-07 DIAGNOSIS — I4891 Unspecified atrial fibrillation: Secondary | ICD-10-CM | POA: Diagnosis not present

## 2019-08-07 DIAGNOSIS — I480 Paroxysmal atrial fibrillation: Secondary | ICD-10-CM

## 2019-08-07 DIAGNOSIS — Z888 Allergy status to other drugs, medicaments and biological substances status: Secondary | ICD-10-CM | POA: Diagnosis not present

## 2019-08-07 DIAGNOSIS — Z8249 Family history of ischemic heart disease and other diseases of the circulatory system: Secondary | ICD-10-CM | POA: Diagnosis not present

## 2019-08-07 DIAGNOSIS — I471 Supraventricular tachycardia: Secondary | ICD-10-CM | POA: Insufficient documentation

## 2019-08-07 DIAGNOSIS — Z7901 Long term (current) use of anticoagulants: Secondary | ICD-10-CM | POA: Diagnosis not present

## 2019-08-07 DIAGNOSIS — Z87891 Personal history of nicotine dependence: Secondary | ICD-10-CM | POA: Diagnosis not present

## 2019-08-07 DIAGNOSIS — Z951 Presence of aortocoronary bypass graft: Secondary | ICD-10-CM | POA: Insufficient documentation

## 2019-08-07 DIAGNOSIS — I252 Old myocardial infarction: Secondary | ICD-10-CM | POA: Insufficient documentation

## 2019-08-07 DIAGNOSIS — E785 Hyperlipidemia, unspecified: Secondary | ICD-10-CM | POA: Insufficient documentation

## 2019-08-07 DIAGNOSIS — N183 Chronic kidney disease, stage 3 (moderate): Secondary | ICD-10-CM | POA: Insufficient documentation

## 2019-08-07 DIAGNOSIS — I5032 Chronic diastolic (congestive) heart failure: Secondary | ICD-10-CM | POA: Diagnosis not present

## 2019-08-07 DIAGNOSIS — Z79899 Other long term (current) drug therapy: Secondary | ICD-10-CM | POA: Diagnosis not present

## 2019-08-07 DIAGNOSIS — G4733 Obstructive sleep apnea (adult) (pediatric): Secondary | ICD-10-CM | POA: Diagnosis not present

## 2019-08-07 DIAGNOSIS — Z809 Family history of malignant neoplasm, unspecified: Secondary | ICD-10-CM | POA: Diagnosis not present

## 2019-08-07 DIAGNOSIS — Z7902 Long term (current) use of antithrombotics/antiplatelets: Secondary | ICD-10-CM | POA: Insufficient documentation

## 2019-08-07 DIAGNOSIS — I25810 Atherosclerosis of coronary artery bypass graft(s) without angina pectoris: Secondary | ICD-10-CM | POA: Insufficient documentation

## 2019-08-07 DIAGNOSIS — I484 Atypical atrial flutter: Secondary | ICD-10-CM | POA: Diagnosis not present

## 2019-08-07 DIAGNOSIS — Z8042 Family history of malignant neoplasm of prostate: Secondary | ICD-10-CM | POA: Insufficient documentation

## 2019-08-07 DIAGNOSIS — I255 Ischemic cardiomyopathy: Secondary | ICD-10-CM | POA: Diagnosis not present

## 2019-08-07 LAB — CBC
HCT: 45.9 % (ref 39.0–52.0)
Hemoglobin: 15 g/dL (ref 13.0–17.0)
MCH: 32.9 pg (ref 26.0–34.0)
MCHC: 32.7 g/dL (ref 30.0–36.0)
MCV: 100.7 fL — ABNORMAL HIGH (ref 80.0–100.0)
Platelets: 164 10*3/uL (ref 150–400)
RBC: 4.56 MIL/uL (ref 4.22–5.81)
RDW: 13.3 % (ref 11.5–15.5)
WBC: 7.4 10*3/uL (ref 4.0–10.5)
nRBC: 0 % (ref 0.0–0.2)

## 2019-08-07 LAB — BASIC METABOLIC PANEL
Anion gap: 11 (ref 5–15)
BUN: 20 mg/dL (ref 8–23)
CO2: 26 mmol/L (ref 22–32)
Calcium: 10.3 mg/dL (ref 8.9–10.3)
Chloride: 99 mmol/L (ref 98–111)
Creatinine, Ser: 1.61 mg/dL — ABNORMAL HIGH (ref 0.61–1.24)
GFR calc Af Amer: 46 mL/min — ABNORMAL LOW (ref >60)
GFR calc non Af Amer: 40 mL/min — ABNORMAL LOW (ref >60)
Glucose, Bld: 100 mg/dL — ABNORMAL HIGH (ref 70–99)
Potassium: 4.1 mmol/L (ref 3.5–5.1)
Sodium: 136 mmol/L (ref 135–145)

## 2019-08-07 LAB — MAGNESIUM: Magnesium: 2.2 mg/dL (ref 1.7–2.4)

## 2019-08-07 NOTE — Patient Instructions (Signed)
No medication changes today  Labs today We will only contact you if something comes back abnormal or we need to make some changes. Otherwise no news is good news!   Your physician recommends that you schedule a follow-up appointment in: 2 months with Dr Aundra Dubin  At the [facility], you and your health needs are our priority. As part of our continuing mission to provide you with exceptional heart care, we have created designated Provider Care Teams. These Care Teams include your primary Cardiologist (physician) and Advanced Practice Providers (APPs- Physician Assistants and Nurse Practitioners) who all work together to provide you with the care you need, when you need it.   You may see any of the following providers on your designated Care Team at your next follow up: [name] Dr Glori Bickers . Dr [name] . Darrick Grinder, NP   Please be sure to bring in all your medications bottles to every appointment.

## 2019-08-08 ENCOUNTER — Telehealth (HOSPITAL_COMMUNITY): Payer: Self-pay

## 2019-08-08 NOTE — Progress Notes (Signed)
ID:  [name], DOB 19-Dec-1936, MRN [id number]    Provider location: [place] [facility] Type of Visit: Established patient   PCP:  Burnard Bunting, MD  Cardiologist:  [name]  Chief Complaint: Shortness of breath   History of Present Illness: [name] is a 83 y.o. male who has history of CAD s/p CABG.  Cath in 11/09 showed that the SVG-distal RCA was patent, the CFX system was patent.  His LIMA was atretic and there were serial 60% and 80% stenoses in the native LAD.  He was managed medically.  Echo in 8/14 showed EF 55-60% with moderate MR and moderate TR.   He was initially noted to have atrial fibrillation in the summer of 2014. He was started on Xarelto and cardioverted to NSR in 8/14.  Recurrent atrial fibrillation was noted in 1/15, and he was cardioverted to NSR again.  This time, NSR did not hold long. By 5/15, he was in persistent atrial fibrillation.  I referred him to [facility] where he had atrial fibrillation ablation and Tikosyn loading.  Ranolazine was stopped due to risk of QT prolongation and Imdur was started as an anti-anginal instead.  Lexiscan Cardiolite in 11/15 showed no ischemia.    CTA chest done in 2/16 to look for evidence for PV stenosis post-AF ablation.  This showed mild short-segment narrowing of the left inferior pulmonary vein.   Patient was admitted in 5/18 with acute on chronic diastolic CHF.  He had been at the beach for several days and ate out a number of times, probably getting a significant sodium load.  No chest pain.  He was in normal sinus rhythm.  He was started on IV Lasix and diuresed.  Echo in 5/18 showed EF 65-70% with moderately dilated RV and normal systolic function, PASP 57 mmHg.  Lexiscan Cardiolite in 6/18 showed no significant perfusion defect.   He was admitted with NSTEMI in 3/20, had DES to ostial LAD and mid SVG-RCA.  Echo showed EF 45-50%.  He was in atypical atrial flutter persistently despite Tikosyn.   After his intervention, he was noted to have a radial artery pseudoaneurysm that was repaired by Dr. Trula Slade.   In 6/20, he had a redo atrial fibrillation ablation.  He was also noted to have complex flutter from multiple foci that was not ablated.  After the procedure, he has had considerable pain in the right leg at the cath site but also radiating down the leg, groin US showed no AV fistula or pseudoaneurysm.    He returns for followup of CHF, atrial fibrillation, and CAD.  Symptomatically, he is doing better.  Lasix was cut back to 20 mg daily due to rise in creatinine, and losartan was stopped. He feels like he goes into atrial fibrillation occasionally, but feels mainly like he is in NSR.  No dyspnea walking around his house or up a flight of stairs.  No chest pain.  Not walking much outside due to the heat. When he feels like he is in atrial fibrillation, he feels more fatigued.  Zio patch from 7/20 showed mostly NSR with a few short runs of SVT.   Labs (8/12): K 4.1, creatinine 1.2, LDL 91, HDL 53 Labs (8/14): K 4.6, creatinine 1.1 Labs (11/14): K 4.6, creatinine 1.2, LDL 91, HDL 56 Labs (3/15): AST 38, ALT 32, TSH normal, BNP 237 Labs (7/15): LDL 96, HDL 48, K 4.2, creatinine 1.2, TSH normal, BNP 237, AST  38, ALT 32 Labs (8/15): LFTs normal Labs (11/15): K 4.2, creatinine 1.1, Mg 2.2, BNP 227 Labs (2/16): K 4, creatinine 1.32, BNP 261 Labs (6/18): K 3.9, creatinine 1.6 Labs (7/18): K 3.9, creatinine 1.5, BNP 242 Labs (12/18): K 3.8, creatinine 1.4, hgb 13.9, LDL 73, HDL 52 Labs (3/19): K 3.7, creatinine 1.4, LDL 76, HDL 42 Labs (6/19): K 4.1, creatinine 1.63 Labs (3/20): K 4.2, creatinine 1.29, LDL 53, HDL 47, hgb 13.8 Labs (4/20): K 4.3, creatinine 1.5 Labs (6/20): K 4.2, creatinine 1.84, hgb 12.4 Labs (7/20): K 4.5, creatinine 2.53 => 1.72  ECG (personally reviewed): NSR, inferior TWIs  PMH: 1. CAD: 1st MI in 1988.  CABG 1996.  PCI to CFX in 2009.  LHC (11/09) SVG-dRCA patent,  total occlusion RCA, patent CFX stent, atretic LIMA, serial 60 and 80% proximal LAD stenoses, EF 60% with basal inferior hypokinesis.  Myoview in 2011 with no ischemia or infarction.  Echo (10/12) with EF 55-60%, mild LVH, mild MR.  Lexiscan Cardiolite in 2013 with no ischemia or infarction.  Echo (8/14) with EF 55-60%, moderate MR, moderate TR, PA systolic pressure 35 mmHg.  Echo (4/15) with EF 60-65%, mild focal basal septal hypertrophy, inferior basal akinesis, mild MR.  Lexiscan cardiolite (11/15) with EF 61%, fixed basal inferoseptal defect, no ischemia.  - Lexiscan Cardiolite (6/18): EF 54%, no perfusion defect.  - NSTEMI 3/20, cath showed 95% ostial/proximal LAD, 60-70% mid LAD, totally occluded SVG-LCx, totally occluded RCA, 80-90% mid SVG-RCA.  Patient had DES to ostial-mid LAD and DES to SVG-RCA.  2. Raynauds syndrome 3. Post-polio syndrome 4. GERD with dilation of esophageal stricture in 12/12.  5. Hyperlipidemia 6. Brainstem stroke with Horner's syndrome.  7. Scoliosis. 8. H/o appendectomy 9. Herpes Zoster 10. Atrial fibrillation: DCCV to NSR in 8/14. DCCV to NSR in 1/15. Atrial fibrillation ablation 6/15 with Tikosyn loading (at [facility]).   - Redo atrial fibrillation ablation in 6/20.  Patient also noted to have complex flutter with several foci, not ablated.  - Zio patch (7/20): Primarily NSR with a few short SVT runs.  11. PFTs (4/15) with FVC 59%, FEV1 54%, ratio 91%, DLCO 53% => moderate obstructive defect thought to be related to COPD and severe restrictive defect thought to be due to elevated left hemidiaphragm and post-polio syndrome.  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  Echo (5/18) with EF 65-70%, RV moderately dilated with normal systolic function, PASP 57 mmHg.  - Echo (3/20): EF 45-50%, mild LV dilation, basal inferolateral and inferior hypokinesis.  13. CKD: Stage 3.  14. OSA: dental appliance.  15. Right radial artery pseudoaneurysm: post-cath in 3/20, s/p repair.    Current  Outpatient Medications  Medication Sig Dispense Refill  . acetaminophen (TYLENOL) 500 MG tablet Take 500 mg by mouth every 8 (eight) hours as needed for mild pain or moderate pain.     [name] albuterol (PROAIR HFA) 108 (90 BASE) MCG/ACT inhaler Inhale 2 puffs into the lungs every 4 (four) hours as needed for wheezing or shortness of breath (and prior to exercise). 1 Inhaler 3  . AMBULATORY NON FORMULARY MEDICATION Apply 1 application topically See admin instructions. Testosterone cream 150mg /15% Place 1 ml  onto the skin daily    . clopidogrel (PLAVIX) 75 MG tablet Take 1 tablet (75 mg total) by mouth daily with breakfast. 30 tablet 6  . dicyclomine (BENTYL) 10 MG capsule Take 1 capsule (10 mg total) by mouth 3 (three) times daily before meals. (Patient taking differently: Take 10 mg by  mouth 3 (three) times daily as needed for spasms. ) 90 capsule 11  . dofetilide (TIKOSYN) 250 MCG capsule TAKE 1 CAPSULE BY MOUTH EVERY 12 HOURS *NEED OFFICE VISIT* (Patient taking differently: Take 250 mcg by mouth every 12 (twelve) hours. (0800 & 2000)) 180 capsule 3  . ezetimibe (ZETIA) 10 MG tablet TAKE 1 TABLET BY MOUTH EVERY DAY (Patient taking differently: Take 10 mg by mouth daily at 8 pm. ) 90 tablet 3  . famotidine (PEPCID) 40 MG tablet Take 1 tablet (40 mg total) by mouth at bedtime. 30 tablet 11  . furosemide (LASIX) 40 MG tablet Take 0.5 tablets (20 mg total) by mouth daily. 15 tablet 6  . lactobacillus acidophilus & bulgar (LACTINEX) chewable tablet Chew 2 tablets by mouth daily. (0800)    . loperamide (IMODIUM) 2 MG capsule Take 2 mg by mouth 2 (two) times daily as needed for diarrhea or loose stools.     [name] LORazepam (ATIVAN) 0.5 MG tablet Take 0.5 mg by mouth at bedtime as needed for sleep.     . methocarbamol (ROBAXIN) 500 MG tablet Take 500 mg by mouth 3 (three) times daily as needed for muscle spasms.     . nebivolol (BYSTOLIC) 10 MG tablet Take 0.5 tablets (5 mg total) by mouth daily. 90 tablet 1  .  nitroGLYCERIN (NITROSTAT) 0.4 MG SL tablet Place 1 tablet (0.4 mg total) under the tongue every 5 (five) minutes as needed for chest pain. 25 tablet 0  . potassium chloride SA (K-DUR) 20 MEQ tablet Take 1 tablet (20 mEq total) by mouth at bedtime. 90 tablet 3  . Probiotic Product (ALIGN) 4 MG CAPS Take 4 mg by mouth daily.    . rosuvastatin (CRESTOR) 40 MG tablet TAKE 1 TABLET BY MOUTH EVERY DAY (Patient taking differently: Take 40 mg by mouth daily at 8 pm. ) 90 tablet 3  . sodium chloride (OCEAN) 0.65 % SOLN nasal spray Place 1 spray into both nostrils as needed for congestion.    . traZODone (DESYREL) 50 MG tablet Take 50 mg by mouth at bedtime.    . Vitamin D, Ergocalciferol, (DRISDOL) 50000 UNITS CAPS capsule Take 50,000 Units by mouth every 14 (fourteen) days. Take on 1st and 15th    . XARELTO 15 MG TABS tablet TAKE 1 TABLET EVERY EVENING (Patient taking differently: Take 15 mg by mouth at bedtime. ) 90 tablet 1   No current facility-administered medications for this encounter.     Allergies:   Morphine and related and Tape   Social History:  The patient  reports that he quit smoking about 52 years ago. His smoking use included cigarettes. He has a 2.50 pack-year smoking history. He quit smokeless tobacco use about 70 years ago. He reports that he does not drink alcohol or use drugs.   Family History:  The patient's family history includes Cancer in his son; Heart Problems in his brother; Heart disease in his father, maternal grandfather, maternal grandmother, mother, paternal grandfather, and paternal grandmother; Prostate cancer in his paternal uncle.   ROS:  Please see the history of present illness.   All other systems are personally reviewed and negative.   Exam:   BP 140/70   Pulse 88   Wt 52.6 kg (116 lb)   SpO2 96%   BMI 18.72 kg/m  General: NAD Neck: No JVD, no thyromegaly or thyroid nodule.  Lungs: Decreased BS left base.  CV: Nondisplaced PMI.  Heart regular S1/S2, no  S3/S4,  no murmur.  No peripheral edema.  No carotid bruit.  Normal pedal pulses.  Abdomen: Soft, nontender, no hepatosplenomegaly, no distention.  Skin: Intact without lesions or rashes.  Neurologic: Alert and oriented x 3.  Psych: Normal affect. Extremities: No clubbing or cyanosis.  HEENT: Normal.    Recent Labs: 01/04/2019: ALT 26 08/07/2019: BUN 20; Creatinine, Ser 1.61; Hemoglobin 15.0; Magnesium 2.2; Platelets 164; Potassium 4.1; Sodium 136  Personally reviewed   Wt Readings from Last 3 Encounters:  08/07/19 52.6 kg (116 lb)  07/06/19 57.2 kg (126 lb)  07/06/19 57.2 kg (126 lb)      ASSESSMENT AND PLAN:  1. Atrial fibrillation: He has been on Tikosyn and is s/p atrial fibrillation ablation at [facility] on 06/25/14.  Recently, he has been in and out of atypical atrial flutter. He had a redo atrial fibrillation ablation in 6/20.  Complex flutter was noted from several foci and was not ablated.  He feels like he has been in and out of AF since ablation.  Today, he is in NSR.  Zio patch from 7/20 showed predominantly NSR with short runs SVT. - Continue Tikosyn.  QTc today is < 500 msec (acceptable).  I will check BMET and Mg.  - Continue Xarelto.  - Continue nebivolol.   - He has followup with Dr. Curt Bears in 9/20.   2. CAD: H/o CABG.  He had NSTEMI in 3/20 with DES to ostial/proximal LAD and SVG-RCA.  No further chest pain.  - He has stopped ASA.   - Continue Plavix at least 6 months, ideally 1 year.  - He will continue Xarelto 15 mg daily long-term for atrial fibrillation/flutter.  - he is on statin and Zetia.  3. Hyperlipidemia: He is taking Crestor 40 mg daily and Zetia 10 mg daily. Good lipids in 3/20.     4. Ischemic cardiomyopathy/primarily diastolic CHF: Echo in 0/16 with EF 45-50%.  NYHA class II symptoms.  Lasix recently decreased due to increased creatinine, but he does not appear volume overloaded.  - Continue Lasix 20 mg daily, BMET today.  - He is off losartan with elevated  creatinine.  - Continue nebivolol.  5. Pulmonary: PFTs suggested both a restrictive and obstructive defect.  The restrictive defect is likely from post-polio syndrome and elevated left hemidiaphragm.  Mr Mally never smoked much, so the obstructive defect is more surprising.  6. OSA: Severe, likely potentiates his atrial fibrillation. He has had a hard time tolerating CPAP.   - Now using an oral appliance.    Followup with me in 2 months.   Signed, [name], MD  08/08/2019  [facility] [address] [facility] [place] [address] [phone] (office) [phone] (fax)

## 2019-08-08 NOTE — Telephone Encounter (Signed)
-----   Message from Larey Dresser, MD sent at 08/07/2019 10:42 PM EDT ----- Creatinine at baseline around 1.6.  Please let him know.

## 2019-08-08 NOTE — Telephone Encounter (Signed)
Pt made aware of results. Results faxed to patient as requested. Pt appreciative

## 2019-08-09 ENCOUNTER — Telehealth (HOSPITAL_COMMUNITY): Payer: Self-pay | Admitting: Internal Medicine

## 2019-08-16 ENCOUNTER — Telehealth (HOSPITAL_COMMUNITY): Payer: Self-pay | Admitting: Pharmacist

## 2019-08-16 NOTE — Telephone Encounter (Signed)
Cardiac Rehab Medication Review by a Pharmacist  Does the patient  feel that his/her medications are working for him/her?  yes  Has the patient been experiencing any side effects to the medications prescribed?  no  Does the patient measure his/her own blood pressure or blood glucose at home?  Yes . Patient checks BP readings at least once to twice a day. Recent BP readings include:116/67 pulse 75 in A-fib. 128/65 A-fib.122/70 pulse 87 no A-fib. 131/68 Pulse A-fib. 106/69 Pulse 86 in A-fib.122/62 Pulse 64 in A-fib.   Does the patient have any problems obtaining medications due to transportation or finances?   no  Understanding of regimen: excellent Understanding of indications: excellent Potential of compliance: excellent    Sherren Kerns, PharmD PGY1 Acute Care Pharmacy Resident [phone]

## 2019-08-21 ENCOUNTER — Telehealth: Payer: Self-pay

## 2019-08-21 ENCOUNTER — Ambulatory Visit (HOSPITAL_COMMUNITY)
Admission: RE | Admit: 2019-08-21 | Discharge: 2019-08-21 | Disposition: A | Discharge status: Home / Self Care | Payer: Medicare Other | Source: Ambulatory Visit | Attending: Internal Medicine | Admitting: Internal Medicine

## 2019-08-21 ENCOUNTER — Encounter (HOSPITAL_COMMUNITY): Payer: Self-pay

## 2019-08-21 ENCOUNTER — Telehealth (HOSPITAL_COMMUNITY): Payer: Self-pay

## 2019-08-21 ENCOUNTER — Other Ambulatory Visit: Payer: Self-pay

## 2019-08-21 VITALS — BP 124/62 | HR 61 | Wt 118.6 lb

## 2019-08-21 DIAGNOSIS — I5022 Chronic systolic (congestive) heart failure: Secondary | ICD-10-CM | POA: Diagnosis not present

## 2019-08-21 DIAGNOSIS — Z01818 Encounter for other preprocedural examination: Secondary | ICD-10-CM | POA: Diagnosis not present

## 2019-08-21 DIAGNOSIS — I4819 Other persistent atrial fibrillation: Secondary | ICD-10-CM

## 2019-08-21 DIAGNOSIS — I5032 Chronic diastolic (congestive) heart failure: Secondary | ICD-10-CM

## 2019-08-21 LAB — BASIC METABOLIC PANEL
Anion gap: 9 (ref 5–15)
BUN: 23 mg/dL (ref 8–23)
CO2: 29 mmol/L (ref 22–32)
Calcium: 10 mg/dL (ref 8.9–10.3)
Chloride: 99 mmol/L (ref 98–111)
Creatinine, Ser: 1.53 mg/dL — ABNORMAL HIGH (ref 0.61–1.24)
GFR calc Af Amer: 49 mL/min — ABNORMAL LOW (ref >60)
GFR calc non Af Amer: 42 mL/min — ABNORMAL LOW (ref >60)
Glucose, Bld: 88 mg/dL (ref 70–99)
Potassium: 4.7 mmol/L (ref 3.5–5.1)
Sodium: 137 mmol/L (ref 135–145)

## 2019-08-21 LAB — CBC
HCT: 46.2 % (ref 39.0–52.0)
Hemoglobin: 15 g/dL (ref 13.0–17.0)
MCH: 32.7 pg (ref 26.0–34.0)
MCHC: 32.5 g/dL (ref 30.0–36.0)
MCV: 100.7 fL — ABNORMAL HIGH (ref 80.0–100.0)
Platelets: 189 10*3/uL (ref 150–400)
RBC: 4.59 MIL/uL (ref 4.22–5.81)
RDW: 13.1 % (ref 11.5–15.5)
WBC: 6.5 10*3/uL (ref 4.0–10.5)
nRBC: 0 % (ref 0.0–0.2)

## 2019-08-21 LAB — PROTIME-INR
INR: 1.8 — ABNORMAL HIGH (ref 0.8–1.2)
Prothrombin Time: 20.6 seconds — ABNORMAL HIGH (ref 11.4–15.2)

## 2019-08-21 LAB — MAGNESIUM: Magnesium: 2.4 mg/dL (ref 1.7–2.4)

## 2019-08-21 NOTE — Progress Notes (Signed)
Patient presents to office for pre procedure EKG and labs  EKG done and patient is in NSR wit PAC's per Dr Zara Council DCCV, ok to have labs done   Procedure cancelled with [facility] Pt aware

## 2019-08-21 NOTE — Telephone Encounter (Signed)
Pt called reporting that he is in afib and have been since last week. Pt has not sought medical attention.  He reports his bp range 119/69- 131/75.  HR range 60's-80s.  He is experiencing dizziness.  Dw with Dr Aundra Dubin, will arrange for cardioversion.  Pt however needs to have an EKG in office to confirm. Pt will be seen by CMA in office of EKG and labs.  Pt scheduled for this Friday 1230p for cardioversion.

## 2019-08-21 NOTE — Progress Notes (Signed)
Letter will be reviewed by office staff once in office for ekg

## 2019-08-21 NOTE — Telephone Encounter (Signed)

## 2019-08-22 ENCOUNTER — Other Ambulatory Visit (HOSPITAL_COMMUNITY): Payer: Medicare Other

## 2019-08-22 ENCOUNTER — Encounter: Payer: Self-pay | Admitting: Cardiology

## 2019-08-22 ENCOUNTER — Telehealth (INDEPENDENT_AMBULATORY_CARE_PROVIDER_SITE_OTHER): Payer: Medicare Other | Admitting: Cardiology

## 2019-08-22 VITALS — BP 127/68 | HR 85 | Ht 66.0 in | Wt 116.6 lb

## 2019-08-22 DIAGNOSIS — G4733 Obstructive sleep apnea (adult) (pediatric): Secondary | ICD-10-CM

## 2019-08-22 DIAGNOSIS — I5032 Chronic diastolic (congestive) heart failure: Secondary | ICD-10-CM | POA: Diagnosis not present

## 2019-08-22 DIAGNOSIS — I480 Paroxysmal atrial fibrillation: Secondary | ICD-10-CM | POA: Diagnosis not present

## 2019-08-22 NOTE — Progress Notes (Signed)
Procedure cancelled.

## 2019-08-22 NOTE — Progress Notes (Signed)
Virtual Visit via Video Note   This visit type was conducted due to national recommendations for restrictions regarding the COVID-19 Pandemic (e.g. social distancing) in an effort to limit this patient's exposure and mitigate transmission in our community.  Due to his co-morbid illnesses, this patient is at least at moderate risk for complications without adequate follow up.  This format is felt to be most appropriate for this patient at this time.  All issues noted in this document were discussed and addressed.  A limited physical exam was performed with this format.  Please refer to the patient's chart for his consent to telehealth for [facility].   Evaluation Performed:  Follow-up visit  This visit type was conducted due to national recommendations for restrictions regarding the COVID-19 Pandemic (e.g. social distancing).  This format is felt to be most appropriate for this patient at this time.  All issues noted in this document were discussed and addressed.  No physical exam was performed (except for noted visual exam findings with Video Visits).  Please refer to the patient's chart (MyChart message for video visits and phone note for telephone visits) for the patient's consent to telehealth for [facility].  Date:  08/22/2019   ID:  [name], DOB [birth year], MRN [id number]  Patient Location:  Home  Provider location:   [place]  PCP:  Burnard Bunting, MD  Primary Cardiologist:  [name], MD Sleep Medicine:  Fransico Him, MD Electrophysiologist:  Will Meredith Leeds, MD   Chief Complaint:  OSA  History of Present Illness:    [name] is a 83 y.o. male who presents via audio/video conferencing for a telehealth visit today.    [name] is a 83 y.o. male with a hx of severe obstructive sleep apnea with an AHI of 37.2/h with moderate oxygen desaturations as low as 82%. He was titrated on CPAP but due to ongoing respiratory events could not be adequately  titrated. He subsequently underwent BiPAP titration but did not tolerate it and did not want to pursue hypoglossal nerve stimulator.  He was referred for oral device and has been followed by Dr. Toy Cookey.  His last HST showed that his AHI if 22.5/hr without the oral device and 11.8/hr with the device.  His device was titrated further by Dr. Toy Cookey but per her letter to me he has not had a repeat HST done. He tells me that he was waiting to have it ordered by [facility] so that if he has nocturnal hypoxemia the O2 could be ordered by them.  He has noticed an improvement in his fatigue and aakens feeling refreshed and has no signifiant daytime sleepiness.  He has not had any issues with his jaw or teeth related to the device.    The patient does not have symptoms concerning for COVID-19 infection (fever, chills, cough, or new shortness of breath).   Prior CV studies:   The following studies were reviewed today:  PAP compliance download  Past Medical History:  Diagnosis Date   Abnormal nuclear cardiac imaging test March 2011   Has positive EKG response, no perfusion defect and normal EF   Adenomatous colon polyp 12/2002   Atrial fibrillation ([place])    a. s/p rfca;  b. chronic tikosyn and xarelto.   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  04/2017 Echo: EF 65-70%, restrictive physiology, mildly dil LA.   Complication of anesthesia    COPD (chronic obstructive pulmonary disease) ([place])    Coronary artery disease    First MI in 1988. PCI in 1996 with subsequent CABG in 1996 due to restenosis. S/P stents to LCX in 2009. Noted to have residual disease in the LAD in a diffuse manner and atretic LIMA graft. He is managed medically.    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   History of Raynaud's syndrome    Hyperlipidemia    Hypertension    Internal hemorrhoids    Muscle spasm    Myocardial infarction (HCC)    MI x1 1989 - 75 age   Neuromuscular disorder ([place])    Polio - age 15   OSA (obstructive sleep apnea) 10/16/2018   Severe OSA with AHI 37.2/hr on BiPAP   Persistent atrial fibrillation    Polio    age 51   PVC (premature ventricular contraction)    Scoliosis    mild   Past Surgical History:  Procedure Laterality Date   ABLATION     done for a fib   APPENDECTOMY     Age 64   ATRIAL FIBRILLATION ABLATION N/A 06/08/2019   Procedure: ATRIAL FIBRILLATION ABLATION;  Surgeon: Constance Haw, MD;  Location: [place] [facility];  Service: Cardiovascular;  Laterality: N/A;   CARDIOVERSION N/A 08/22/2013   Procedure: CARDIOVERSION;  Surgeon: Carlena Bjornstad, MD;  Location: [place];  Service: Cardiovascular;  Laterality: N/A;   CARDIOVERSION N/A 01/16/2014   Procedure: CARDIOVERSION;  Surgeon: Lelon Perla, MD;  Location: [facility] ENDOSCOPY;  Service: Cardiovascular;  Laterality: N/A;   CARDIOVERSION N/A 12/11/2018   Procedure: CARDIOVERSION;  Surgeon: Larey Dresser, MD;  Location: [facility] ENDOSCOPY;  Service: Cardiovascular;  Laterality: N/A;   COLONOSCOPY  2008   CORONARY ARTERY BYPASS GRAFT  1996   with a LIMA to the LAD, SVG to RCA and SVG to OM   CORONARY STENT INTERVENTION N/A 03/26/2019   Procedure: CORONARY STENT INTERVENTION;  Surgeon: Jettie Booze, MD;  Location: [place] [facility];  Service: Cardiovascular;  Laterality: N/A;   CORONARY STENT PLACEMENT  1996   LCX   EMBOLECTOMY Right 05/09/2019   Procedure: Embolectomy of right radial artery;  Surgeon: Serafina Mitchell, MD;  Location: [place];  Service: Vascular;  Laterality: Right;   ESOPHAGEAL DILATION     EYE SURGERY     FALSE  ANEURYSM REPAIR Right 05/09/2019   Procedure: REPAIR FALSE ANEURYSM RIGHT RADIAL;  Surgeon: Serafina Mitchell, MD;  Location: [place];  Service: Vascular;  Laterality: Right;   LEFT HEART CATH AND CORS/GRAFTS ANGIOGRAPHY N/A 03/26/2019   Procedure: LEFT HEART CATH AND CORS/GRAFTS ANGIOGRAPHY;  Surgeon: Larey Dresser, MD;  Location: [place] [facility];  Service: Cardiovascular;  Laterality: N/A;   POLYPECTOMY     TONSILLECTOMY     UPPER GASTROINTESTINAL ENDOSCOPY     esophegeal dilation     Current Meds  Medication Sig   acetaminophen (TYLENOL) 500 MG tablet Take 500 mg by mouth every 8 (eight) hours as needed for mild pain or moderate pain.    albuterol (PROAIR HFA) 108 (90 BASE) MCG/ACT  inhaler Inhale 2 puffs into the lungs every 4 (four) hours as needed for wheezing or shortness of breath (and prior to exercise).   AMBULATORY NON FORMULARY MEDICATION Apply 1 application topically See admin instructions. Testosterone cream 150mg /15% Place 1 ml  onto the skin daily   clopidogrel (PLAVIX) 75 MG tablet Take 1 tablet (75 mg total) by mouth daily with breakfast.   dicyclomine (BENTYL) 10 MG capsule Take 1 capsule (10 mg total) by mouth 3 (three) times daily before meals. (Patient taking differently: Take 10 mg by mouth 3 (three) times daily as needed for spasms. )   dofetilide (TIKOSYN) 250 MCG capsule TAKE 1 CAPSULE BY MOUTH EVERY 12 HOURS *NEED OFFICE VISIT* (Patient taking differently: Take 250 mcg by mouth every 12 (twelve) hours. (0800 & 2000))   ezetimibe (ZETIA) 10 MG tablet TAKE 1 TABLET BY MOUTH EVERY DAY (Patient taking differently: Take 10 mg by mouth daily at 8 pm. )   famotidine (PEPCID) 40 MG tablet Take 1 tablet (40 mg total) by mouth at bedtime.   furosemide (LASIX) 40 MG tablet Take 0.5 tablets (20 mg total) by mouth daily.   lactobacillus acidophilus & bulgar (LACTINEX) chewable tablet Chew 2 tablets by mouth daily. (0800)   loperamide (IMODIUM) 2 MG capsule Take 2  mg by mouth 2 (two) times daily as needed for diarrhea or loose stools. One or two tablets daily as needed   LORazepam (ATIVAN) 0.5 MG tablet Take 0.5 mg by mouth at bedtime.    magnesium oxide (MAG-OX) 400 MG tablet Take 400 mg by mouth daily.   methocarbamol (ROBAXIN) 500 MG tablet Take 500 mg by mouth 2 (two) times daily.    nebivolol (BYSTOLIC) 10 MG tablet Take 0.5 tablets (5 mg total) by mouth daily.   nitroGLYCERIN (NITROSTAT) 0.4 MG SL tablet Place 1 tablet (0.4 mg total) under the tongue every 5 (five) minutes as needed for chest pain.   potassium chloride SA (K-DUR) 20 MEQ tablet Take 1 tablet (20 mEq total) by mouth at bedtime.   Probiotic Product (ALIGN) 4 MG CAPS Take 4 mg by mouth daily.   rosuvastatin (CRESTOR) 40 MG tablet TAKE 1 TABLET BY MOUTH EVERY DAY (Patient taking differently: Take 40 mg by mouth daily at 8 pm. )   sodium chloride (OCEAN) 0.65 % SOLN nasal spray Place 1 spray into both nostrils as needed for congestion.   traZODone (DESYREL) 50 MG tablet Take 50 mg by mouth at bedtime.   Vitamin D, Ergocalciferol, (DRISDOL) 50000 UNITS CAPS capsule Take 50,000 Units by mouth every 14 (fourteen) days. Take on 1st and 15th   XARELTO 15 MG TABS tablet TAKE 1 TABLET EVERY EVENING (Patient taking differently: Take 15 mg by mouth at bedtime. )     Allergies:   Morphine and related and Tape   Social History   Tobacco Use   Smoking status: Former Smoker    Packs/day: 0.50    Years: 5.00    Pack years: 2.50    Types: Cigarettes    Quit date: 12/27/1966    Years since quitting: 52.6   Smokeless tobacco: Former Systems developer    Quit date: 1950  Substance Use Topics   Alcohol use: No   Drug use: No     Family Hx: The patient's family history includes Cancer in his son; Heart Problems in his brother; Heart disease in his father, maternal grandfather, maternal grandmother, mother, paternal grandfather, and paternal grandmother; Prostate cancer in his paternal uncle.  There is no history of Colon cancer, Esophageal cancer, Pancreatic cancer, Rectal cancer, or Stomach cancer.  ROS:   Please see the history of present illness.     All other systems reviewed and are negative.   Labs/Other Tests and Data Reviewed:    Recent Labs: 01/04/2019: ALT 26 08/21/2019: BUN 23; Creatinine, Ser 1.53; Hemoglobin 15.0; Magnesium 2.4; Platelets 189; Potassium 4.7; Sodium 137   Recent Lipid Panel Lab Results  Component Value Date/Time   CHOL 111 03/27/2019 03:33 AM   TRIG 53 03/27/2019 03:33 AM   HDL 47 03/27/2019 03:33 AM   CHOLHDL 2.4 03/27/2019 03:33 AM   LDLCALC 53 03/27/2019 03:33 AM    Wt Readings from Last 3 Encounters:  08/22/19 116 lb 9.6 oz (52.9 kg)  08/21/19 118 lb 9.6 oz (53.8 kg)  08/07/19 116 lb (52.6 kg)     Objective:    Vital Signs:  BP 127/68    Pulse 85    Ht 5\' 6"  (1.676 m)    Wt 116 lb 9.6 oz (52.9 kg)    BMI 18.82 kg/m    CONSTITUTIONAL:  Well nourished, well developed male in no acute distress.  EYES: anicteric MOUTH: oral mucosa is pink RESPIRATORY: Normal respiratory effort, symmetric expansion CARDIOVASCULAR: No peripheral edema SKIN: No rash, lesions or ulcers MUSCULOSKELETAL: no digital cyanosis NEURO: Cranial Nerves II-XII grossly intact, moves all extremities PSYCH: Intact judgement and insight.  A&O x 3, Mood/affect appropriate   ASSESSMENT & PLAN:    1.  OSA - he was intolerant to CPAP and BiPAP and was adamantly against the hypoglossal nerve stimulator.  He is now on O2 at night at and using an oral device.  His last HST showed that his AHI if 22.5/hr without the oral device and 11.8/hr with the device.  His device was titrated by Dr. Toy Cookey but per her letter to me he has not had that done.    I am going to set him up for the home sleep study with [facility] and we will be able to see if he needs supplement O2 as well.  I told him I would like his AHI< 5.    2.  PAF - He is followed in afib clinic.  He is s/p afib ablation  in June.  He continues to have intermittent palpitations.  EKG yesterday in Dr. Claris Gladden office showed NSR with short bursts of nonsustained atrial fibrillation. Continue on Bystolic 5mg  daily, Tikosyn 264mcg BID and Xarelto.    3.  Chronic diastolic CHF - his weight is stable.  He has not had any SOB or LE edema.  He will continue on Lasix 20mg  daily.     COVID-19 Education: The signs and symptoms of COVID-19 were discussed with the patient and how to seek care for testing (follow up with PCP or arrange E-visit).  The importance of social distancing was discussed today.  Patient Risk:   After full review of this patient's clinical status, I feel that they are at least moderate risk at this time.  Time:   Today, I have spent 20 minutes  on telemedicine discussing medical problems including OSA, afib.  We also reviewed the symptoms of COVID 19 and the ways to protect against contracting the virus with telehealth technology.  I spent an additional 5 minutes reviewing patient's chart including home sleep study.  Medication Adjustments/Labs and Tests Ordered: Current medicines are reviewed at length with the patient today.  Concerns regarding medicines are outlined above.  Tests Ordered: No orders of the defined types were placed in this encounter.  Medication Changes: No orders of the defined types were placed in this encounter.   Disposition:  Follow up in 1 year(s)  Signed, Fransico Him, MD  08/22/2019 8:27 AM    [facility]

## 2019-08-22 NOTE — Patient Instructions (Signed)
Medication Instructions:  Your physician recommends that you continue on your current medications as directed. Please refer to the Current Medication list given to you today.  If you need a refill on your cardiac medications before your next appointment, please call your pharmacy.   Lab work: None   If you have labs (blood work) drawn today and your tests are completely normal, you will receive your results only by: [name] MyChart Message (if you have MyChart) OR . A paper copy in the mail If you have any lab test that is abnormal or we need to change your treatment, we will call you to review the results.  Testing/Procedures: Your physician has recommended that you have a sleep study. This test records several body functions during sleep, including: brain activity, eye movement, oxygen and carbon dioxide blood levels, heart rate and rhythm, breathing rate and rhythm, the flow of air through your mouth and nose, snoring, body muscle movements, and chest and belly movement.  2  Follow-Up: At [facility], you and your health needs are our priority.  As part of our continuing mission to provide you with exceptional heart care, we have created designated Provider Care Teams.  These Care Teams include your primary Cardiologist (physician) and Advanced Practice Providers (APPs -  Physician Assistants and Nurse Practitioners) who all work together to provide you with the care you need, when you need it. You will need a follow up appointment in 12 months.  Please call our office 2 months in advance to schedule this appointment.  You may see Dr. Radford Pax or one of the following Advanced Practice Providers on your designated Care Team:   [place], PA-C Melina Copa, PA-C . Ermalinda Barrios, PA-C  Any Other Special Instructions Will Be Listed Below (If Applicable).

## 2019-08-22 NOTE — Telephone Encounter (Signed)
LATE ENTRY Pt was in office yesterday for ekg, pt in NSR. MD aware.  Procedure cancelled.

## 2019-08-23 ENCOUNTER — Telehealth: Payer: Self-pay | Admitting: *Deleted

## 2019-08-23 NOTE — Telephone Encounter (Signed)
Please order a home sleep study through [facility] for OSA. Followup with me in1 year

## 2019-08-23 NOTE — Telephone Encounter (Signed)
-----   Message from Mendel Ryder, [place] sent at 08/22/2019  9:07 AM EDT ----- Regarding: Home sleep test [name] [id number]  Pt needs to be scheduled for a home sleep test Dx: OSA   For home sleep test do we still choose [facility]?  Thanks

## 2019-08-23 NOTE — Telephone Encounter (Signed)
Staff message sent to Nina ok to schedule HST. Insurance does not require a PA. 

## 2019-08-24 ENCOUNTER — Ambulatory Visit (HOSPITAL_COMMUNITY): Admit: 2019-08-24 | Payer: Medicare Other | Admitting: Cardiology

## 2019-08-24 ENCOUNTER — Telehealth (HOSPITAL_COMMUNITY): Payer: Self-pay | Admitting: Cardiology

## 2019-08-24 ENCOUNTER — Encounter (HOSPITAL_COMMUNITY): Payer: Self-pay

## 2019-08-24 SURGERY — CARDIOVERSION
Anesthesia: General

## 2019-08-24 NOTE — Telephone Encounter (Signed)
As requested most recent labs faxed to patient at [place] via epic routing

## 2019-08-28 ENCOUNTER — Encounter (HOSPITAL_COMMUNITY): Payer: Self-pay

## 2019-08-28 ENCOUNTER — Other Ambulatory Visit: Payer: Self-pay

## 2019-08-28 ENCOUNTER — Encounter (HOSPITAL_COMMUNITY)
Admission: RE | Admit: 2019-08-28 | Discharge: 2019-08-28 | Disposition: A | Discharge status: Home / Self Care | Payer: Medicare Other | Source: Ambulatory Visit | Attending: Cardiology | Admitting: Cardiology

## 2019-08-28 ENCOUNTER — Other Ambulatory Visit (HOSPITAL_COMMUNITY): Payer: Self-pay | Admitting: *Deleted

## 2019-08-28 VITALS — BP 142/72 | HR 64 | Temp 97.9°F | Ht 66.25 in | Wt 121.5 lb

## 2019-08-28 DIAGNOSIS — Z955 Presence of coronary angioplasty implant and graft: Secondary | ICD-10-CM | POA: Insufficient documentation

## 2019-08-28 DIAGNOSIS — I214 Non-ST elevation (NSTEMI) myocardial infarction: Secondary | ICD-10-CM | POA: Insufficient documentation

## 2019-08-28 HISTORY — DX: Cardiac arrhythmia, unspecified: I49.9

## 2019-08-28 NOTE — Progress Notes (Signed)
Cardiac Individual Treatment Plan  Patient Details  Name: [name] MRN: [id number] Date of Birth: 01-15-1936 Referring Provider:     [place] from 08/28/2019 in [facility]  Referring Provider  [name]       Initial Encounter Date:    CARDIAC REHAB PHASE II ORIENTATION from 08/28/2019 in [facility]  Date  08/28/19      Visit Diagnosis: NSTEMI (non-ST elevated myocardial infarction) ([place]) 03/25/19  Status post coronary artery stent placement 03/26/19 S/P DES LAD  Patient's Home Medications on Admission:  Current Outpatient Medications:  .  acetaminophen (TYLENOL) 500 MG tablet, Take 500 mg by mouth every 8 (eight) hours as needed for mild pain or moderate pain. , Disp: , Rfl:  .  albuterol (PROAIR HFA) 108 (90 BASE) MCG/ACT inhaler, Inhale 2 puffs into the lungs every 4 (four) hours as needed for wheezing or shortness of breath (and prior to exercise)., Disp: 1 Inhaler, Rfl: 3 .  AMBULATORY NON FORMULARY MEDICATION, Apply 1 application topically See admin instructions. Testosterone cream 150mg /15% Place 1 ml  onto the skin daily, Disp: , Rfl:  .  clopidogrel (PLAVIX) 75 MG tablet, Take 1 tablet (75 mg total) by mouth daily with breakfast., Disp: 30 tablet, Rfl: 6 .  dicyclomine (BENTYL) 10 MG capsule, Take 1 capsule (10 mg total) by mouth 3 (three) times daily before meals. (Patient taking differently: Take 10 mg by mouth 3 (three) times daily as needed for spasms. ), Disp: 90 capsule, Rfl: 11 .  dofetilide (TIKOSYN) 250 MCG capsule, TAKE 1 CAPSULE BY MOUTH EVERY 12 HOURS *NEED OFFICE VISIT* (Patient taking differently: Take 250 mcg by mouth every 12 (twelve) hours. (0800 & 2000)), Disp: 180 capsule, Rfl: 3 .  ezetimibe (ZETIA) 10 MG tablet, TAKE 1 TABLET BY MOUTH EVERY DAY (Patient taking differently: Take 10 mg by mouth daily at 8 pm. ), Disp: 90 tablet, Rfl: 3 .  famotidine (PEPCID) 40 MG  tablet, Take 1 tablet (40 mg total) by mouth at bedtime., Disp: 30 tablet, Rfl: 11 .  furosemide (LASIX) 40 MG tablet, Take 0.5 tablets (20 mg total) by mouth daily., Disp: 15 tablet, Rfl: 6 .  lactobacillus acidophilus & bulgar (LACTINEX) chewable tablet, Chew 2 tablets by mouth daily. (0800), Disp: , Rfl:  .  loperamide (IMODIUM) 2 MG capsule, Take 2 mg by mouth 2 (two) times daily as needed for diarrhea or loose stools. One or two tablets daily as needed, Disp: , Rfl:  .  LORazepam (ATIVAN) 0.5 MG tablet, Take 0.5 mg by mouth at bedtime. , Disp: , Rfl:  .  magnesium oxide (MAG-OX) 400 MG tablet, Take 400 mg by mouth daily., Disp: , Rfl:  .  methocarbamol (ROBAXIN) 500 MG tablet, Take 500 mg by mouth 2 (two) times daily. , Disp: , Rfl:  .  nebivolol (BYSTOLIC) 10 MG tablet, Take 0.5 tablets (5 mg total) by mouth daily., Disp: 90 tablet, Rfl: 1 .  nitroGLYCERIN (NITROSTAT) 0.4 MG SL tablet, Place 1 tablet (0.4 mg total) under the tongue every 5 (five) minutes as needed for chest pain., Disp: 25 tablet, Rfl: 0 .  potassium chloride SA (K-DUR) 20 MEQ tablet, Take 1 tablet (20 mEq total) by mouth at bedtime., Disp: 90 tablet, Rfl: 3 .  Probiotic Product (ALIGN) 4 MG CAPS, Take 4 mg by mouth daily., Disp: , Rfl:  .  rosuvastatin (CRESTOR) 40 MG tablet, TAKE 1 TABLET  BY MOUTH EVERY DAY (Patient taking differently: Take 40 mg by mouth daily at 8 pm. ), Disp: 90 tablet, Rfl: 3 .  sodium chloride (OCEAN) 0.65 % SOLN nasal spray, Place 1 spray into both nostrils as needed for congestion., Disp: , Rfl:  .  traZODone (DESYREL) 50 MG tablet, Take 50 mg by mouth at bedtime., Disp: , Rfl:  .  Vitamin D, Ergocalciferol, (DRISDOL) 50000 UNITS CAPS capsule, Take 50,000 Units by mouth every 14 (fourteen) days. Take on 1st and 15th, Disp: , Rfl:  .  XARELTO 15 MG TABS tablet, TAKE 1 TABLET EVERY EVENING (Patient taking differently: Take 15 mg by mouth at bedtime. ), Disp: 90 tablet, Rfl: 1  Past Medical  History: Past Medical History:  Diagnosis Date  . Abnormal nuclear cardiac imaging test March 2011   Has positive EKG response, no perfusion defect and normal EF  . Adenomatous colon polyp 12/2002  . Arrhythmia   . Atrial fibrillation ([place])    a. s/p rfca;  b. chronic tikosyn and xarelto.  . 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 Cataract    bil cataracts removed  . Chronic diastolic CHF (congestive heart failure) ([place])    a. 04/2017 Echo: EF 65-70%, restrictive physiology, mildly dil LA.  [name] Complication of anesthesia   . COPD (chronic obstructive pulmonary disease) ([place])   . Coronary artery disease    First MI in 1988. PCI in 1996 with subsequent CABG in 1996 due to restenosis. S/P stents to LCX in 2009. Noted to have residual disease in the LAD in a diffuse manner and atretic LIMA graft. He is managed medically.   . Difficult intubation    06/08/19 update - Intubated with MAC3 with grade IIb view 7.0 tube passed easily  . Dysrhythmia    Afib  . Esophageal stricture   . GERD (gastroesophageal reflux disease)   . 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 History of Raynaud's syndrome   . Hyperlipidemia   . Hypertension   . Internal hemorrhoids   . Muscle spasm   . Myocardial infarction [facility])    MI x1 52 - 43 age  . Neuromuscular disorder ([place])    Polio - age 13  . OSA (obstructive sleep apnea) 10/16/2018   Severe OSA with AHI 37.2/hr on BiPAP  . Persistent atrial fibrillation   . Polio    age 41  . PVC (premature ventricular contraction)   . Scoliosis    mild    Tobacco Use: Social History   Tobacco Use  Smoking Status Former Smoker  . Packs/day: 0.50  . Years: 5.00  . Pack years: 2.50  . Types: Cigarettes  . Quit date: 12/27/1966  . Years since quitting: 52.7  Smokeless Tobacco Former Systems developer  . Quit date: 1950    Labs: Recent Review Flowsheet Data    Labs for ITP Cardiac and Pulmonary Rehab Latest Ref  Rng & Units 07/19/2014 12/08/2017 12/07/2018 03/26/2019 03/27/2019   Cholestrol 0 - 200 mg/dL 162 148 124 - 111   LDLCALC 0 - 99 mg/dL 96 73 62 - 53   HDL >40 mg/dL 48.20 52 53 - 47   Trlycerides <150 mg/dL 88.0 116 47 - 53   Hemoglobin A1c 4.8 - 5.6 % - - - 6.2(H) -      Capillary Blood Glucose: No results found for: GLUCAP   Exercise Target Goals: Exercise Program Goal: Individual exercise prescription  set using results from initial 6 min walk test and THRR while considering  patient's activity barriers and safety.   Exercise Prescription Goal: Initial exercise prescription builds to 30-45 minutes a day of aerobic activity, 2-3 days per week.  Home exercise guidelines will be given to patient during program as part of exercise prescription that the participant will acknowledge.  Activity Barriers & Risk Stratification: Activity Barriers & Cardiac Risk Stratification - 08/28/19 1030      Activity Barriers & Cardiac Risk Stratification   Activity Barriers  Deconditioning    Cardiac Risk Stratification  High       6 Minute Walk: 6 Minute Walk    Row Name 08/28/19 1029         6 Minute Walk   Phase  Initial     Distance  1200 feet     Walk Time  6 minutes     # of Rest Breaks  0     MPH  2.2     METS  2.3     RPE  11     Perceived Dyspnea   0     VO2 Peak  8.11     Symptoms  No     Resting HR  64 bpm     Resting BP  142/72     Resting Oxygen Saturation   96 %     Exercise Oxygen Saturation  during 6 min walk  96 %     Max Ex. HR  76 bpm     Max Ex. BP  128/66     2 Minute Post BP  118/60        Oxygen Initial Assessment:   Oxygen Re-Evaluation:   Oxygen Discharge (Final Oxygen Re-Evaluation):   Initial Exercise Prescription: Initial Exercise Prescription - 08/28/19 1000      Date of Initial Exercise RX and Referring Provider   Date  08/28/19    Referring Provider  [name]     Expected Discharge Date  10/12/19      NuStep   Level  2    SPM  75     Minutes  15    METs  2      Arm Ergometer   Level  1.5    Watts  15    Minutes  15    METs  2.4      Prescription Details   Frequency (times per week)  3    Duration  Progress to 30 minutes of continuous aerobic without signs/symptoms of physical distress      Intensity   THRR 40-80% of Max Heartrate  56-111    Ratings of Perceived Exertion  11-13      Progression   Progression  Continue to progress workloads to maintain intensity without signs/symptoms of physical distress.      Resistance Training   Training Prescription  Yes    Weight  4 lbs.     Reps  10-15       Perform Capillary Blood Glucose checks as needed.  Exercise Prescription Changes:   Exercise Comments:   Exercise Goals and Review: Exercise Goals    Row Name 08/28/19 1032             Exercise Goals   Increase Physical Activity  Yes       Intervention  Provide advice, education, support and counseling about physical activity/exercise needs.;Develop an individualized exercise prescription for aerobic and resistive training based on initial evaluation  findings, risk stratification, comorbidities and participant's personal goals.       Expected Outcomes  Short Term: Attend rehab on a regular basis to increase amount of physical activity.;Long Term: Add in home exercise to make exercise part of routine and to increase amount of physical activity.;Long Term: Exercising regularly at least 3-5 days a week.       Increase Strength and Stamina  Yes       Intervention  Provide advice, education, support and counseling about physical activity/exercise needs.;Develop an individualized exercise prescription for aerobic and resistive training based on initial evaluation findings, risk stratification, comorbidities and participant's personal goals.       Expected Outcomes  Short Term: Increase workloads from initial exercise prescription for resistance, speed, and METs.;Short Term: Perform resistance training  exercises routinely during rehab and add in resistance training at home;Long Term: Improve cardiorespiratory fitness, muscular endurance and strength as measured by increased METs and functional capacity (6MWT)       Able to understand and use rate of perceived exertion (RPE) scale  Yes       Intervention  Provide education and explanation on how to use RPE scale       Expected Outcomes  Short Term: Able to use RPE daily in rehab to express subjective intensity level;Long Term:  Able to use RPE to guide intensity level when exercising independently       Knowledge and understanding of Target Heart Rate Range (THRR)  Yes       Intervention  Provide education and explanation of THRR including how the numbers were predicted and where they are located for reference       Expected Outcomes  Short Term: Able to state/look up THRR;Long Term: Able to use THRR to govern intensity when exercising independently;Short Term: Able to use daily as guideline for intensity in rehab       Able to check pulse independently  Yes       Intervention  Provide education and demonstration on how to check pulse in carotid and radial arteries.;Review the importance of being able to check your own pulse for safety during independent exercise       Expected Outcomes  Short Term: Able to explain why pulse checking is important during independent exercise;Long Term: Able to check pulse independently and accurately       Understanding of Exercise Prescription  Yes       Intervention  Provide education, explanation, and written materials on patient's individual exercise prescription       Expected Outcomes  Short Term: Able to explain program exercise prescription;Long Term: Able to explain home exercise prescription to exercise independently          Exercise Goals Re-Evaluation :   Discharge Exercise Prescription (Final Exercise Prescription Changes):   Nutrition:  Target Goals: Understanding of nutrition guidelines, daily  intake of sodium 1500mg , cholesterol 200mg , calories 30% from fat and 7% or less from saturated fats, daily to have 5 or more servings of fruits and vegetables.  Biometrics: Pre Biometrics - 08/28/19 1032      Pre Biometrics   Height  5' 6.25" (1.683 m)    Weight  55.1 kg    Waist Circumference  29.5 inches    Hip Circumference  34 inches    Waist to Hip Ratio  0.87 %    BMI (Calculated)  19.45    Triceps Skinfold  21 mm    % Body Fat  21.6 %  Grip Strength  22 kg    Flexibility  0 in    Single Leg Stand  7.43 seconds        Nutrition Therapy Plan and Nutrition Goals:   Nutrition Assessments:   Nutrition Goals Re-Evaluation:   Nutrition Goals Re-Evaluation:   Nutrition Goals Discharge (Final Nutrition Goals Re-Evaluation):   Psychosocial: Target Goals: Acknowledge presence or absence of significant depression and/or stress, maximize coping skills, provide positive support system. Participant is able to verbalize types and ability to use techniques and skills needed for reducing stress and depression.  Initial Review & Psychosocial Screening: Initial Psych Review & Screening - 08/28/19 1127      Initial Review   Current issues with  None Identified      Family Dynamics   Good Support System?  Yes   Patient has his wife for support     Barriers   Psychosocial barriers to participate in program  There are no identifiable barriers or psychosocial needs.      Screening Interventions   Interventions  Encouraged to exercise       Quality of Life Scores: Quality of Life - 08/28/19 1040      Quality of Life   Select  Quality of Life      Quality of Life Scores   Health/Function Pre  23.4 %    Socioeconomic Pre  27.21 %    Psych/Spiritual Pre  29 %    Family Pre  28.8 %    GLOBAL Pre  26.05 %      Scores of 19 and below usually indicate a poorer quality of life in these areas.  A difference of  2-3 points is a clinically meaningful difference.  A  difference of 2-3 points in the total score of the Quality of Life Index has been associated with significant improvement in overall quality of life, self-image, physical symptoms, and general health in studies assessing change in quality of life.  PHQ-9: Recent Review Flowsheet Data    Depression screen [facility] 2/9 08/28/2019   Decreased Interest 0   Down, Depressed, Hopeless 0   PHQ - 2 Score 0     Interpretation of Total Score  Total Score Depression Severity:  1-4 = Minimal depression, 5-9 = Mild depression, 10-14 = Moderate depression, 15-19 = Moderately severe depression, 20-27 = Severe depression   Psychosocial Evaluation and Intervention:   Psychosocial Re-Evaluation:   Psychosocial Discharge (Final Psychosocial Re-Evaluation):   Vocational Rehabilitation: Provide vocational rehab assistance to qualifying candidates.   Vocational Rehab Evaluation & Intervention: Vocational Rehab - 08/28/19 1129      Initial Vocational Rehab Evaluation & Intervention   Assessment shows need for Vocational Rehabilitation  No   Ed is currently working and does not need vocational rehab at this time      Education: Education Goals: Education classes will be provided on a weekly basis, covering required topics. Participant will state understanding/return demonstration of topics presented.  Learning Barriers/Preferences: Learning Barriers/Preferences - 08/28/19 1034      Learning Barriers/Preferences   Learning Barriers  None    Learning Preferences  Individual Instruction;Pictoral;Video;Written Material       Education Topics: Count Your Pulse:  -Group instruction provided by verbal instruction, demonstration, patient participation and written materials to support subject.  Instructors address importance of being able to find your pulse and how to count your pulse when at home without a heart monitor.  Patients get hands on experience counting their pulse with staff  help and  individually.   Heart Attack, Angina, and Risk Factor Modification:  -Group instruction provided by verbal instruction, video, and written materials to support subject.  Instructors address signs and symptoms of angina and heart attacks.    Also discuss risk factors for heart disease and how to make changes to improve heart health risk factors.   Functional Fitness:  -Group instruction provided by verbal instruction, demonstration, patient participation, and written materials to support subject.  Instructors address safety measures for doing things around the house.  Discuss how to get up and down off the floor, how to pick things up properly, how to safely get out of a chair without assistance, and balance training.   Meditation and Mindfulness:  -Group instruction provided by verbal instruction, patient participation, and written materials to support subject.  Instructor addresses importance of mindfulness and meditation practice to help reduce stress and improve awareness.  Instructor also leads participants through a meditation exercise.    Stretching for Flexibility and Mobility:  -Group instruction provided by verbal instruction, patient participation, and written materials to support subject.  Instructors lead participants through series of stretches that are designed to increase flexibility thus improving mobility.  These stretches are additional exercise for major muscle groups that are typically performed during regular warm up and cool down.   Hands Only CPR:  -Group verbal, video, and participation provides a basic overview of AHA guidelines for community CPR. Role-play of emergencies allow participants the opportunity to practice calling for help and chest compression technique with discussion of AED use.   Hypertension: -Group verbal and written instruction that provides a basic overview of hypertension including the most recent diagnostic guidelines, risk factor reduction with  self-care instructions and medication management.    Nutrition I class: Heart Healthy Eating:  -Group instruction provided by PowerPoint slides, verbal discussion, and written materials to support subject matter. The instructor gives an explanation and review of the Therapeutic Lifestyle Changes diet recommendations, which includes a discussion on lipid goals, dietary fat, sodium, fiber, plant stanol/sterol esters, sugar, and the components of a well-balanced, healthy diet.   Nutrition II class: Lifestyle Skills:  -Group instruction provided by PowerPoint slides, verbal discussion, and written materials to support subject matter. The instructor gives an explanation and review of label reading, grocery shopping for heart health, heart healthy recipe modifications, and ways to make healthier choices when eating out.   Diabetes Question & Answer:  -Group instruction provided by PowerPoint slides, verbal discussion, and written materials to support subject matter. The instructor gives an explanation and review of diabetes co-morbidities, pre- and post-prandial blood glucose goals, pre-exercise blood glucose goals, signs, symptoms, and treatment of hypoglycemia and hyperglycemia, and foot care basics.   Diabetes Blitz:  -Group instruction provided by PowerPoint slides, verbal discussion, and written materials to support subject matter. The instructor gives an explanation and review of the physiology behind type 1 and type 2 diabetes, diabetes medications and rational behind using different medications, pre- and post-prandial blood glucose recommendations and Hemoglobin A1c goals, diabetes diet, and exercise including blood glucose guidelines for exercising safely.    Portion Distortion:  -Group instruction provided by PowerPoint slides, verbal discussion, written materials, and food models to support subject matter. The instructor gives an explanation of serving size versus portion size, changes in  portions sizes over the last 20 years, and what consists of a serving from each food group.   Stress Management:  -Group instruction provided by verbal instruction, video, and written  materials to support subject matter.  Instructors review role of stress in heart disease and how to cope with stress positively.     Exercising on Your Own:  -Group instruction provided by verbal instruction, power point, and written materials to support subject.  Instructors discuss benefits of exercise, components of exercise, frequency and intensity of exercise, and end points for exercise.  Also discuss use of nitroglycerin and activating EMS.  Review options of places to exercise outside of rehab.  Review guidelines for sex with heart disease.   Cardiac Drugs I:  -Group instruction provided by verbal instruction and written materials to support subject.  Instructor reviews cardiac drug classes: antiplatelets, anticoagulants, beta blockers, and statins.  Instructor discusses reasons, side effects, and lifestyle considerations for each drug class.   Cardiac Drugs II:  -Group instruction provided by verbal instruction and written materials to support subject.  Instructor reviews cardiac drug classes: angiotensin converting enzyme inhibitors (ACE-I), angiotensin II receptor blockers (ARBs), nitrates, and calcium channel blockers.  Instructor discusses reasons, side effects, and lifestyle considerations for each drug class.   Anatomy and Physiology of the Circulatory System:  Group verbal and written instruction and models provide basic cardiac anatomy and physiology, with the coronary electrical and arterial systems. Review of: AMI, Angina, Valve disease, Heart Failure, Peripheral Artery Disease, Cardiac Arrhythmia, Pacemakers, and the ICD.   Other Education:  -Group or individual verbal, written, or video instructions that support the educational goals of the cardiac rehab program.   Holiday Eating Survival  Tips:  -Group instruction provided by PowerPoint slides, verbal discussion, and written materials to support subject matter. The instructor gives patients tips, tricks, and techniques to help them not only survive but enjoy the holidays despite the onslaught of food that accompanies the holidays.   Knowledge Questionnaire Score: Knowledge Questionnaire Score - 08/28/19 1038      Knowledge Questionnaire Score   Pre Score  22/24       Core Components/Risk Factors/Patient Goals at Admission: Personal Goals and Risk Factors at Admission - 08/28/19 1130      Core Components/Risk Factors/Patient Goals on Admission    Weight Management  Weight Maintenance;Yes    Intervention  Weight Management: Develop a combined nutrition and exercise program designed to reach desired caloric intake, while maintaining appropriate intake of nutrient and fiber, sodium and fats, and appropriate energy expenditure required for the weight goal.    Expected Outcomes  Short Term: Continue to assess and modify interventions until short term weight is achieved;Long Term: Adherence to nutrition and physical activity/exercise program aimed toward attainment of established weight goal;Weight Maintenance: Understanding of the daily nutrition guidelines, which includes 25-35% calories from fat, 7% or less cal from saturated fats, less than 200mg  cholesterol, less than 1.5gm of sodium, & 5 or more servings of fruits and vegetables daily;Understanding recommendations for meals to include 15-35% energy as protein, 25-35% energy from fat, 35-60% energy from carbohydrates, less than 200mg  of dietary cholesterol, 20-35 gm of total fiber daily;Understanding of distribution of calorie intake throughout the day with the consumption of 4-5 meals/snacks    Hypertension  Yes    Intervention  Provide education on lifestyle modifcations including regular physical activity/exercise, weight management, moderate sodium restriction and increased  consumption of fresh fruit, vegetables, and low fat dairy, alcohol moderation, and smoking cessation.;Monitor prescription use compliance.    Expected Outcomes  Short Term: Continued assessment and intervention until BP is < 140/96mm HG in hypertensive participants. < 130/24mm HG in hypertensive participants  with diabetes, heart failure or chronic kidney disease.;Long Term: Maintenance of blood pressure at goal levels.    Lipids  Yes    Intervention  Provide education and support for participant on nutrition & aerobic/resistive exercise along with prescribed medications to achieve LDL 70mg , HDL >40mg .    Expected Outcomes  Short Term: Participant states understanding of desired cholesterol values and is compliant with medications prescribed. Participant is following exercise prescription and nutrition guidelines.;Long Term: Cholesterol controlled with medications as prescribed, with individualized exercise RX and with personalized nutrition plan. Value goals: LDL < 70mg , HDL > 40 mg.    Stress  Yes    Intervention  Offer individual and/or small group education and counseling on adjustment to heart disease, stress management and health-related lifestyle change. Teach and support self-help strategies.;Refer participants experiencing significant psychosocial distress to appropriate mental health specialists for further evaluation and treatment. When possible, include family members and significant others in education/counseling sessions.    Expected Outcomes  Short Term: Participant demonstrates changes in health-related behavior, relaxation and other stress management skills, ability to obtain effective social support, and compliance with psychotropic medications if prescribed.;Long Term: Emotional wellbeing is indicated by absence of clinically significant psychosocial distress or social isolation.       Core Components/Risk Factors/Patient Goals Review:    Core Components/Risk Factors/Patient Goals at  Discharge (Final Review):    ITP Comments: ITP Comments    Row Name 08/28/19 1118           ITP Comments  Dr Fransico Him MD, Medical Director          Comments: Patient attended orientation on 08/28/2019 to review rules and guidelines for program.  Completed 6 minute walk test, Intitial ITP, and exercise prescription.  VSS. Telemetry-per MD review of 12 lead Sinus with PAC's.  Asymptomatic. Safety measures and social distancing in place per CDC guidelines.Barnet Pall, RN,BSN 08/28/2019 11:37 AM

## 2019-08-28 NOTE — Progress Notes (Signed)
Patient here for orientation for cardiac rehab. Unable to determine whether a p wave on lead 2 ECG.. Patient asymptomatic. Entry blood pressure 142/72 heart rate 64. Oxygen saturation 96% on room air. Heart rate 64. Dr Claris Gladden office called and notified, spoke with [facility]. Dr Aundra Dubin ordered a 12 lead ECG. 12 lead ECG obtained. Dr Aundra Dubin reviewed the 12 lead ECG and said that Mr [name] was in Sinus Rhythm with PAC's and may proceed with exercise.Barnet Pall, RN,BSN 08/28/2019 11:15 AM

## 2019-08-30 ENCOUNTER — Telehealth: Payer: Self-pay | Admitting: *Deleted

## 2019-08-30 NOTE — Telephone Encounter (Signed)
Patient is aware and agreeable to Home Sleep Study through [facility]. Patient is scheduled for 10/24/19 at 12 pm to pick up home sleep kit and meet with Respiratory therapist at [facility]. Patient is aware that if this appointment date and time does not work for them they should contact Artis Delay directly at [phone]. Patient is aware that a sleep packet will be sent from [facility] in week. Patient is agreeable to treatment and thankful for call.

## 2019-08-30 NOTE — Telephone Encounter (Signed)
-----   Message from Lauralee Evener, [place] sent at 08/23/2019  9:05 AM EDT ----- Regarding: RE: Home sleep test Ok to schedule. No PA is required. ----- Message ----- From: Mendel Ryder, CMA Sent: 08/22/2019   9:07 AM EDT To: Freada Bergeron, CMA, C[facility] Sleep Studies Subject: Home sleep test                                [name] [id number]  Pt needs to be scheduled for a home sleep test Dx: OSA   For home sleep test do we still choose [facility]?  Thanks

## 2019-09-05 ENCOUNTER — Other Ambulatory Visit: Payer: Self-pay

## 2019-09-05 ENCOUNTER — Encounter (HOSPITAL_COMMUNITY)
Admission: RE | Admit: 2019-09-05 | Discharge: 2019-09-05 | Disposition: A | Discharge status: Home / Self Care | Payer: Medicare Other | Source: Ambulatory Visit | Attending: Cardiology | Admitting: Cardiology

## 2019-09-05 ENCOUNTER — Encounter (HOSPITAL_COMMUNITY): Payer: Medicare Other

## 2019-09-05 DIAGNOSIS — Z955 Presence of coronary angioplasty implant and graft: Secondary | ICD-10-CM

## 2019-09-05 DIAGNOSIS — I214 Non-ST elevation (NSTEMI) myocardial infarction: Secondary | ICD-10-CM | POA: Diagnosis not present

## 2019-09-05 NOTE — Progress Notes (Signed)
Daily Session Note  Patient Details  Name: [name] MRN: [id number] Date of Birth: [birth year] Referring Provider:     CARDIAC REHAB PHASE II ORIENTATION from 08/28/2019 in [place]  Referring Provider  [name]       Encounter Date: 09/05/2019  Check In: Session Check In - 09/05/19 1106      Check-In   Supervising physician immediately available to respond to emergencies  [facility] immediately available    Physician(s)  Dr Nevada Crane    Location  [facility]    Staff Present  Trish Fountain, RN, Deland Pretty, MS, ACSM CEP, Exercise Physiologist;Brittany Durene Fruits, BS, ACSM CEP, Exercise Physiologist; , RN, BSN    Virtual Visit  Yes    Medication changes reported      Yes    Fall or balance concerns reported     Yes    Tobacco Cessation  No Change    Warm-up and Cool-down  Performed on first and last piece of equipment    Resistance Training Performed  Yes    VAD Patient?  No    PAD/SET Patient?  No      Pain Assessment   Currently in Pain?  No/denies    Multiple Pain Sites  No       Capillary Blood Glucose: No results found for this or any previous visit (from the past 24 hour(s)).    Social History   Tobacco Use  Smoking Status Former Smoker  . Packs/day: 0.50  . Years: 5.00  . Pack years: 2.50  . Types: Cigarettes  . Quit date: 12/27/1966  . Years since quitting: 52.7  Smokeless Tobacco Former Systems developer  . Quit date: 1950    Goals Met:  No report of cardiac concerns or symptoms  Goals Unmet:  Not Applicable  Comments: Pt started cardiac rehab today.  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 Patient was moved to an easier arm ergometer on his second exercise station VSS, telemetry-assumed Sinus Rhtyhm, asymptomatic.  Medication list reconciled. Pt denies barriers to medicaiton compliance.  PSYCHOSOCIAL ASSESSMENT:  PHQ-0. Pt exhibits positive  coping skills, hopeful outlook with supportive family. No psychosocial needs identified at this time, no psychosocial interventions necessary.    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   Pt oriented to exercise equipment and routine.    Understanding verbalized.Barnet Pall, RN,BSN 09/05/2019 12:01 PM   Dr. Fransico Him is Medical Director for Cardiac Rehab at [facility].

## 2019-09-07 ENCOUNTER — Encounter (HOSPITAL_COMMUNITY): Payer: Medicare Other

## 2019-09-07 ENCOUNTER — Other Ambulatory Visit: Payer: Self-pay

## 2019-09-07 ENCOUNTER — Encounter (HOSPITAL_COMMUNITY)
Admission: RE | Admit: 2019-09-07 | Discharge: 2019-09-07 | Disposition: A | Discharge status: Home / Self Care | Payer: Medicare Other | Source: Ambulatory Visit | Attending: Cardiology | Admitting: Cardiology

## 2019-09-07 DIAGNOSIS — I214 Non-ST elevation (NSTEMI) myocardial infarction: Secondary | ICD-10-CM | POA: Diagnosis not present

## 2019-09-07 DIAGNOSIS — Z955 Presence of coronary angioplasty implant and graft: Secondary | ICD-10-CM

## 2019-09-07 NOTE — Progress Notes (Signed)
Pt presents to CR with HR 113 in AFib.  Pt with known history of AFib s/p multiple DCCV and ablation.  BP 112/50.  Dr. Claris Gladden office called to clarify if [name] would like to be notified when pt is in A Fib.   Also requested for: 1. HR parameters for starting exercise 2. Max THR increase to 90% which would be 125.  MD office returned call after speaking with [name].  Orders received to allow pt to HR exercise as long as HR is less than 120 and increase THR to 90% of max HR which is 125.  MD office would also like to see patient on Monday, 09/10/2019, at 1000 for a nurse visit for an EKG.   Will continue to monitor pt closely.

## 2019-09-10 ENCOUNTER — Encounter (HOSPITAL_COMMUNITY): Payer: Medicare Other

## 2019-09-10 ENCOUNTER — Other Ambulatory Visit: Payer: Self-pay

## 2019-09-10 ENCOUNTER — Encounter (HOSPITAL_COMMUNITY)
Admission: RE | Admit: 2019-09-10 | Discharge: 2019-09-10 | Disposition: A | Discharge status: Home / Self Care | Payer: Medicare Other | Source: Ambulatory Visit | Attending: Cardiology | Admitting: Cardiology

## 2019-09-10 ENCOUNTER — Ambulatory Visit (HOSPITAL_COMMUNITY): Admission: RE | Admit: 2019-09-10 | Payer: Medicare Other | Source: Ambulatory Visit

## 2019-09-10 ENCOUNTER — Encounter (HOSPITAL_COMMUNITY): Payer: Self-pay

## 2019-09-10 DIAGNOSIS — I214 Non-ST elevation (NSTEMI) myocardial infarction: Secondary | ICD-10-CM | POA: Diagnosis not present

## 2019-09-10 DIAGNOSIS — Z955 Presence of coronary angioplasty implant and graft: Secondary | ICD-10-CM

## 2019-09-10 NOTE — Progress Notes (Signed)
Reviewed home exercise guidelines with patient including endpoints, temperature precautions, target heart rate and rate of perceived exertion. Pt plans to walk as his mode of home exercise. Pt voices understanding of instructions given.   Tex Conroy M Carriann Hesse, MS, ACSM CEP  

## 2019-09-12 ENCOUNTER — Other Ambulatory Visit: Payer: Self-pay

## 2019-09-12 ENCOUNTER — Encounter (HOSPITAL_COMMUNITY): Payer: Medicare Other

## 2019-09-12 ENCOUNTER — Ambulatory Visit (HOSPITAL_COMMUNITY)
Admission: RE | Admit: 2019-09-12 | Discharge: 2019-09-12 | Disposition: A | Discharge status: Home / Self Care | Payer: Medicare Other | Source: Ambulatory Visit | Attending: Family Medicine | Admitting: Family Medicine

## 2019-09-12 ENCOUNTER — Encounter (HOSPITAL_COMMUNITY)
Admission: RE | Admit: 2019-09-12 | Discharge: 2019-09-12 | Disposition: A | Discharge status: Home / Self Care | Payer: Medicare Other | Source: Ambulatory Visit | Attending: Cardiology | Admitting: Cardiology

## 2019-09-12 DIAGNOSIS — Z955 Presence of coronary angioplasty implant and graft: Secondary | ICD-10-CM | POA: Diagnosis not present

## 2019-09-12 DIAGNOSIS — I214 Non-ST elevation (NSTEMI) myocardial infarction: Secondary | ICD-10-CM | POA: Diagnosis not present

## 2019-09-12 NOTE — Progress Notes (Addendum)
[name] reported lightheadedness and general malaise at CR today.  BP 108/58 and Gatorade given.  On telemetry, irregular rhythm with P waves that are not present in Lead II.  HR 60's. HF clinic called to update on pt's symptoms and inability to discern P waves in Lead II. 12 Lead EKG ordered to evaluate rhythm.  Results reviewed by [name] and no new orders given.  Pt reports no more lightheadedness and "feeling better."  Exit BP 108/54 Pt asymptomatic.

## 2019-09-13 ENCOUNTER — Ambulatory Visit (INDEPENDENT_AMBULATORY_CARE_PROVIDER_SITE_OTHER): Payer: Medicare Other | Admitting: Cardiology

## 2019-09-13 ENCOUNTER — Encounter: Payer: Self-pay | Admitting: Cardiology

## 2019-09-13 VITALS — BP 130/64 | HR 65 | Ht 66.25 in | Wt 122.8 lb

## 2019-09-13 DIAGNOSIS — I2510 Atherosclerotic heart disease of native coronary artery without angina pectoris: Secondary | ICD-10-CM | POA: Diagnosis not present

## 2019-09-13 DIAGNOSIS — I4819 Other persistent atrial fibrillation: Secondary | ICD-10-CM

## 2019-09-13 NOTE — Progress Notes (Signed)
Cardiac Individual Treatment Plan  Patient Details  Name: [name] MRN: [id number] Date of Birth: Feb 26, 1936 (83 years old) Referring Provider:     [place] from 08/28/2019 in [facility]  Referring Provider  [name]       Initial Encounter Date:    CARDIAC REHAB PHASE II ORIENTATION from 08/28/2019 in [facility]  Date  08/28/19      Visit Diagnosis: NSTEMI (non-ST elevated myocardial infarction) ([place]) 03/25/19  Status post coronary artery stent placement 03/26/19 S/P DES LAD  Patient's Home Medications on Admission:  Current Outpatient Medications:  .  acetaminophen (TYLENOL) 500 MG tablet, Take 500 mg by mouth every 8 (eight) hours as needed for mild pain or moderate pain. , Disp: , Rfl:  .  albuterol (PROAIR HFA) 108 (90 BASE) MCG/ACT inhaler, Inhale 2 puffs into the lungs every 4 (four) hours as needed for wheezing or shortness of breath (and prior to exercise)., Disp: 1 Inhaler, Rfl: 3 .  AMBULATORY NON FORMULARY MEDICATION, Apply 1 application topically See admin instructions. Testosterone cream 139m/15% Place 1 ml  onto the skin daily, Disp: , Rfl:  .  clopidogrel (PLAVIX) 75 MG tablet, Take 1 tablet (75 mg total) by mouth daily with breakfast., Disp: 30 tablet, Rfl: 6 .  dicyclomine (BENTYL) 10 MG capsule, Take 1 capsule (10 mg total) by mouth 3 (three) times daily before meals., Disp: 90 capsule, Rfl: 11 .  dofetilide (TIKOSYN) 250 MCG capsule, TAKE 1 CAPSULE BY MOUTH EVERY 12 HOURS *NEED OFFICE VISIT*, Disp: 180 capsule, Rfl: 3 .  ezetimibe (ZETIA) 10 MG tablet, TAKE 1 TABLET BY MOUTH EVERY DAY, Disp: 90 tablet, Rfl: 3 .  famotidine (PEPCID) 40 MG tablet, Take 1 tablet (40 mg total) by mouth at bedtime., Disp: 30 tablet, Rfl: 11 .  furosemide (LASIX) 40 MG tablet, Take 0.5 tablets (20 mg total) by mouth daily., Disp: 15 tablet, Rfl: 6 .  lactobacillus acidophilus & bulgar (LACTINEX) chewable tablet,  Chew 2 tablets by mouth daily. (0800), Disp: , Rfl:  .  loperamide (IMODIUM) 2 MG capsule, Take 2 mg by mouth 2 (two) times daily as needed for diarrhea or loose stools. One or two tablets daily as needed, Disp: , Rfl:  .  LORazepam (ATIVAN) 0.5 MG tablet, Take 0.5 mg by mouth at bedtime. , Disp: , Rfl:  .  magnesium oxide (MAG-OX) 400 MG tablet, Take 400 mg by mouth daily., Disp: , Rfl:  .  methocarbamol (ROBAXIN) 500 MG tablet, Take 500 mg by mouth 2 (two) times daily. , Disp: , Rfl:  .  nebivolol (BYSTOLIC) 10 MG tablet, Take 0.5 tablets (5 mg total) by mouth daily., Disp: 90 tablet, Rfl: 1 .  nitroGLYCERIN (NITROSTAT) 0.4 MG SL tablet, Place 1 tablet (0.4 mg total) under the tongue every 5 (five) minutes as needed for chest pain., Disp: 25 tablet, Rfl: 0 .  potassium chloride SA (K-DUR) 20 MEQ tablet, Take 1 tablet (20 mEq total) by mouth at bedtime., Disp: 90 tablet, Rfl: 3 .  Probiotic Product (ALIGN) 4 MG CAPS, Take 4 mg by mouth daily., Disp: , Rfl:  .  rosuvastatin (CRESTOR) 40 MG tablet, TAKE 1 TABLET BY MOUTH EVERY DAY, Disp: 90 tablet, Rfl: 3 .  sodium chloride (OCEAN) 0.65 % SOLN nasal spray, Place 1 spray into both nostrils as needed for congestion., Disp: , Rfl:  .  traZODone (DESYREL) 50 MG tablet, Take 50 mg by mouth  at bedtime., Disp: , Rfl:  .  Vitamin D, Ergocalciferol, (DRISDOL) 50000 UNITS CAPS capsule, Take 50,000 Units by mouth every 14 (fourteen) days. Take on 1st and 15th, Disp: , Rfl:  .  XARELTO 15 MG TABS tablet, TAKE 1 TABLET EVERY EVENING, Disp: 90 tablet, Rfl: 1  Past Medical History: Past Medical History:  Diagnosis Date  . Abnormal nuclear cardiac imaging test March 2011   Has positive EKG response, no perfusion defect and normal EF  . Adenomatous colon polyp 12/2002  . Arrhythmia   . Atrial fibrillation ([place])    a. s/p rfca;  b. chronic tikosyn and xarelto.  . 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  Cataract    bil cataracts removed  . Chronic diastolic CHF (congestive heart failure) ([place])    a. 04/2017 Echo: EF 65-70%, restrictive physiology, mildly dil LA.  [name] Complication of anesthesia   . COPD (chronic obstructive pulmonary disease) ([place])   . Coronary artery disease    First MI in 1988. PCI in 1996 with subsequent CABG in 1996 due to restenosis. S/P stents to LCX in 2009. Noted to have residual disease in the LAD in a diffuse manner and atretic LIMA graft. He is managed medically.   . Difficult intubation    06/08/19 update - Intubated with MAC3 with grade IIb view 7.0 tube passed easily  . Dysrhythmia    Afib  . Esophageal stricture   . GERD (gastroesophageal reflux disease)   . 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 History of Raynaud's syndrome   . Hyperlipidemia   . Hypertension   . Internal hemorrhoids   . Muscle spasm   . Myocardial infarction [facility])    MI x1 83 - 28 age  . 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 OSA (obstructive sleep apnea) 10/16/2018   Severe OSA with AHI 37.2/hr on BiPAP  . Persistent atrial fibrillation   . Polio    age 83  . PVC (premature ventricular contraction)   . Scoliosis    mild    Tobacco Use: Social History   Tobacco Use  Smoking Status Former Smoker  . Packs/day: 0.50  . Years: 5.00  . Pack years: 2.50  . Types: Cigarettes  . Quit date: 12/27/1966  . Years since quitting: 52.7  Smokeless Tobacco Former Systems developer  . Quit date: 1950    Labs: Recent Review Flowsheet Data    Labs for ITP Cardiac and Pulmonary Rehab Latest Ref Rng & Units 07/19/2014 12/08/2017 12/07/2018 03/26/2019 03/27/2019   Cholestrol 0 - 200 mg/dL 162 148 124 - 111   LDLCALC 0 - 99 mg/dL 96 73 62 - 53   HDL >40 mg/dL 48.20 52 53 - 47   Trlycerides <150 mg/dL 88.0 116 47 - 53   Hemoglobin A1c 4.8 - 5.6 % - - - 6.2(H) -      Capillary Blood Glucose: No results found for: GLUCAP   Exercise Target Goals: Exercise Program Goal: Individual  exercise prescription set using results from initial 6 min walk test and THRR while considering  patient's activity barriers and safety.   Exercise Prescription Goal: Initial exercise prescription builds to 30-45 minutes a day of aerobic activity, 2-3 days per week.  Home exercise guidelines will be given to patient during program as part of exercise prescription that the participant will acknowledge.  Activity Barriers & Risk Stratification: Activity Barriers &  Cardiac Risk Stratification - 09/05/19 1153      Activity Barriers & Cardiac Risk Stratification   Activity Barriers  Deconditioning;Other (comment)    Comments  Patient has right arm pain from weakness in right arm from Polio in the past.    Cardiac Risk Stratification  High       6 Minute Walk: 6 Minute Walk    Row Name 08/28/19 1029         6 Minute Walk   Phase  Initial     Distance  1200 feet     Walk Time  6 minutes     # of Rest Breaks  0     MPH  2.2     METS  2.3     RPE  11     Perceived Dyspnea   0     VO2 Peak  8.11     Symptoms  No     Resting HR  64 bpm     Resting BP  142/72     Resting Oxygen Saturation   96 %     Exercise Oxygen Saturation  during 6 min walk  96 %     Max Ex. HR  76 bpm     Max Ex. BP  128/66     2 Minute Post BP  118/60        Oxygen Initial Assessment:   Oxygen Re-Evaluation:   Oxygen Discharge (Final Oxygen Re-Evaluation):   Initial Exercise Prescription: Initial Exercise Prescription - 08/28/19 1000      Date of Initial Exercise RX and Referring Provider   Date  08/28/19    Referring Provider  [name]     Expected Discharge Date  10/12/19      NuStep   Level  2    SPM  75    Minutes  15    METs  2      Arm Ergometer   Level  1.5    Watts  15    Minutes  15    METs  2.4      Prescription Details   Frequency (times per week)  3    Duration  Progress to 30 minutes of continuous aerobic without signs/symptoms of physical distress      Intensity    THRR 40-80% of Max Heartrate  56-111    Ratings of Perceived Exertion  11-13      Progression   Progression  Continue to progress workloads to maintain intensity without signs/symptoms of physical distress.      Resistance Training   Training Prescription  Yes    Weight  4 lbs.     Reps  10-15       Perform Capillary Blood Glucose checks as needed.  Exercise Prescription Changes: Exercise Prescription Changes    Row Name 09/05/19 1057 09/10/19 1048           Response to Exercise   Blood Pressure (Admit)  115/60  139/70      Blood Pressure (Exercise)  120/70  122/60      Blood Pressure (Exit)  120/70  106/62      Heart Rate (Admit)  92 bpm  64 bpm      Heart Rate (Exercise)  117 bpm  70 bpm      Heart Rate (Exit)  93 bpm  64 bpm      Rating of Perceived Exertion (Exercise)  12  12      Symptoms  none  none      Comments  Patient had some right arm pain on arm ergometer, switched to lighter arm ergometer.  -      Duration  Progress to 30 minutes of  aerobic without signs/symptoms of physical distress  Progress to 30 minutes of  aerobic without signs/symptoms of physical distress      Intensity  THRR unchanged  THRR unchanged        Progression   Progression  Continue to progress workloads to maintain intensity without signs/symptoms of physical distress.  Continue to progress workloads to maintain intensity without signs/symptoms of physical distress.      Average METs  2.2  -        Resistance Training   Training Prescription  No Relaxation day, no weights.  Yes      Weight  -  2lbs      Reps  -  10-15      Time  -  10 Minutes        Interval Training   Interval Training  No  No        NuStep   Level  2  2      SPM  71  -      Minutes  15  30      METs  2.2  - Didn't report MET avg        Arm Ergometer   Level  1  -      Watts  15  -      Minutes  15  -      METs  2.3  -        Home Exercise Plan   Plans to continue exercise at  -  Home (comment) Walking       Frequency  -  Add 1 additional day to program exercise sessions.      Initial Home Exercises Provided  -  09/10/19         Exercise Comments: Exercise Comments    Row Name 09/05/19 1153 09/10/19 1057         Exercise Comments  Patient tolerated 1st session of exercise fair with some right arm pain on the arm ergometer.  Reviewed home exercise guidelines, METs, and goals with patient.         Exercise Goals and Review: Exercise Goals    Row Name 08/28/19 1032             Exercise Goals   Increase Physical Activity  Yes       Intervention  Provide advice, education, support and counseling about physical activity/exercise needs.;Develop an individualized exercise prescription for aerobic and resistive training based on initial evaluation findings, risk stratification, comorbidities and participant's personal goals.       Expected Outcomes  Short Term: Attend rehab on a regular basis to increase amount of physical activity.;Long Term: Add in home exercise to make exercise part of routine and to increase amount of physical activity.;Long Term: Exercising regularly at least 3-5 days a week.       Increase Strength and Stamina  Yes       Intervention  Provide advice, education, support and counseling about physical activity/exercise needs.;Develop an individualized exercise prescription for aerobic and resistive training based on initial evaluation findings, risk stratification, comorbidities and participant's personal goals.       Expected Outcomes  Short Term: Increase workloads from initial exercise prescription for resistance, speed, and METs.;Short Term: Perform  resistance training exercises routinely during rehab and add in resistance training at home;Long Term: Improve cardiorespiratory fitness, muscular endurance and strength as measured by increased METs and functional capacity (6MWT)       Able to understand and use rate of perceived exertion (RPE) scale  Yes       Intervention   Provide education and explanation on how to use RPE scale       Expected Outcomes  Short Term: Able to use RPE daily in rehab to express subjective intensity level;Long Term:  Able to use RPE to guide intensity level when exercising independently       Knowledge and understanding of Target Heart Rate Range (THRR)  Yes       Intervention  Provide education and explanation of THRR including how the numbers were predicted and where they are located for reference       Expected Outcomes  Short Term: Able to state/look up THRR;Long Term: Able to use THRR to govern intensity when exercising independently;Short Term: Able to use daily as guideline for intensity in rehab       Able to check pulse independently  Yes       Intervention  Provide education and demonstration on how to check pulse in carotid and radial arteries.;Review the importance of being able to check your own pulse for safety during independent exercise       Expected Outcomes  Short Term: Able to explain why pulse checking is important during independent exercise;Long Term: Able to check pulse independently and accurately       Understanding of Exercise Prescription  Yes       Intervention  Provide education, explanation, and written materials on patient's individual exercise prescription       Expected Outcomes  Short Term: Able to explain program exercise prescription;Long Term: Able to explain home exercise prescription to exercise independently          Exercise Goals Re-Evaluation : Exercise Goals Re-Evaluation    [place] Name 09/05/19 1153 09/10/19 1057           Exercise Goal Re-Evaluation   Exercise Goals Review  Increase Physical Activity;Able to understand and use rate of perceived exertion (RPE) scale  Increase Physical Activity;Able to understand and use rate of perceived exertion (RPE) scale;Able to check pulse independently;Knowledge and understanding of Target Heart Rate Range (THRR);Understanding of Exercise Prescription       Comments  Patient able to understand and use RPE scale appropriately. Pt had some right arm pain on arm ergometer from weakness in that arm as a result of hx of Polio. Switched to lighter arm ergometer.  Switched patient from arm ergometer to recumbent stepper for 30 minutes because chronic right arm pain. Reviewed home exercise guidelines with patient including THRR, RPE scale, and endpoints for exercise. Pt is able to check pulse independently.      Expected Outcomes  Progress workloads as tolerated to help improve cardiorespiratory fitness.  Patient will add at least one day of walking at home in addition to exercise at cardiac rehab.         Discharge Exercise Prescription (Final Exercise Prescription Changes): Exercise Prescription Changes - 09/10/19 1048      Response to Exercise   Blood Pressure (Admit)  139/70    Blood Pressure (Exercise)  122/60    Blood Pressure (Exit)  106/62    Heart Rate (Admit)  64 bpm    Heart Rate (Exercise)  70 bpm  Heart Rate (Exit)  64 bpm    Rating of Perceived Exertion (Exercise)  12    Symptoms  none    Duration  Progress to 30 minutes of  aerobic without signs/symptoms of physical distress    Intensity  THRR unchanged      Progression   Progression  Continue to progress workloads to maintain intensity without signs/symptoms of physical distress.      Resistance Training   Training Prescription  Yes    Weight  2lbs    Reps  10-15    Time  10 Minutes      Interval Training   Interval Training  No      NuStep   Level  2    Minutes  30    METs  --   Didn't report MET avg     Home Exercise Plan   Plans to continue exercise at  Home (comment)   Walking   Frequency  Add 1 additional day to program exercise sessions.    Initial Home Exercises Provided  09/10/19       Nutrition:  Target Goals: Understanding of nutrition guidelines, daily intake of sodium <1537m, cholesterol <2047m calories 30% from fat and 7% or less from saturated  fats, daily to have 5 or more servings of fruits and vegetables.  Biometrics: Pre Biometrics - 08/28/19 1032      Pre Biometrics   Height  5' 6.25" (1.683 m)    Weight  55.1 kg    Waist Circumference  29.5 inches    Hip Circumference  34 inches    Waist to Hip Ratio  0.87 %    BMI (Calculated)  19.45    Triceps Skinfold  21 mm    % Body Fat  21.6 %    Grip Strength  22 kg    Flexibility  0 in    Single Leg Stand  7.43 seconds        Nutrition Therapy Plan and Nutrition Goals:   Nutrition Assessments:   Nutrition Goals Re-Evaluation:   Nutrition Goals Re-Evaluation:   Nutrition Goals Discharge (Final Nutrition Goals Re-Evaluation):   Psychosocial: Target Goals: Acknowledge presence or absence of significant depression and/or stress, maximize coping skills, provide positive support system. Participant is able to verbalize types and ability to use techniques and skills needed for reducing stress and depression.  Initial Review & Psychosocial Screening: Initial Psych Review & Screening - 08/28/19 1127      Initial Review   Current issues with  None Identified      Family Dynamics   Good Support System?  Yes   Patient has his wife for support     Barriers   Psychosocial barriers to participate in program  There are no identifiable barriers or psychosocial needs.      Screening Interventions   Interventions  Encouraged to exercise       Quality of Life Scores: Quality of Life - 08/28/19 1040      Quality of Life   Select  Quality of Life      Quality of Life Scores   Health/Function Pre  23.4 %    Socioeconomic Pre  27.21 %    Psych/Spiritual Pre  29 %    Family Pre  28.8 %    GLOBAL Pre  26.05 %      Scores of 19 and below usually indicate a poorer quality of life in these areas.  A difference of  2-3 points is  a clinically meaningful difference.  A difference of 2-3 points in the total score of the Quality of Life Index has been associated with  significant improvement in overall quality of life, self-image, physical symptoms, and general health in studies assessing change in quality of life.  PHQ-9: Recent Review Flowsheet Data    Depression screen [facility] 2/9 08/28/2019   Decreased Interest 0   Down, Depressed, Hopeless 0   PHQ - 2 Score 0     Interpretation of Total Score  Total Score Depression Severity:  1-4 = Minimal depression, 5-9 = Mild depression, 10-14 = Moderate depression, 15-19 = Moderately severe depression, 20-27 = Severe depression   Psychosocial Evaluation and Intervention: Psychosocial Evaluation - 09/10/19 1405      Psychosocial Evaluation & Interventions   Interventions  Encouraged to exercise with the program and follow exercise prescription    Comments  No psychosocial interventions necessary.  Ed enjoys working and riding his motorcycle.    Expected Outcomes  Ed will maintain a positive outlook.    Continue Psychosocial Services   No Follow up required       Psychosocial Re-Evaluation: Psychosocial Re-Evaluation    [place] Name 09/10/19 1405             Psychosocial Re-Evaluation   Current issues with  None Identified       Comments  Ed does not require any pschosocial interventions.       Expected Outcomes  Ed will maintain a positive outlook.       Interventions  Encouraged to attend Cardiac Rehabilitation for the exercise       Continue Psychosocial Services   No Follow up required          Psychosocial Discharge (Final Psychosocial Re-Evaluation): Psychosocial Re-Evaluation - 09/10/19 1405      Psychosocial Re-Evaluation   Current issues with  None Identified    Comments  Ed does not require any pschosocial interventions.    Expected Outcomes  Ed will maintain a positive outlook.    Interventions  Encouraged to attend Cardiac Rehabilitation for the exercise    Continue Psychosocial Services   No Follow up required       Vocational Rehabilitation: Provide vocational rehab assistance to  qualifying candidates.   Vocational Rehab Evaluation & Intervention: Vocational Rehab - 08/28/19 1129      Initial Vocational Rehab Evaluation & Intervention   Assessment shows need for Vocational Rehabilitation  No   Ed is currently working and does not need vocational rehab at this time      Education: Education Goals: Education classes will be provided on a weekly basis, covering required topics. Participant will state understanding/return demonstration of topics presented.  Learning Barriers/Preferences: Learning Barriers/Preferences - 08/28/19 1034      Learning Barriers/Preferences   Learning Barriers  None    Learning Preferences  Individual Instruction;Pictoral;Video;Written Material       Education Topics: Count Your Pulse:  -Group instruction provided by verbal instruction, demonstration, patient participation and written materials to support subject.  Instructors address importance of being able to find your pulse and how to count your pulse when at home without a heart monitor.  Patients get hands on experience counting their pulse with staff help and individually.   Heart Attack, Angina, and Risk Factor Modification:  -Group instruction provided by verbal instruction, video, and written materials to support subject.  Instructors address signs and symptoms of angina and heart attacks.    Also discuss risk factors for  heart disease and how to make changes to improve heart health risk factors.   Functional Fitness:  -Group instruction provided by verbal instruction, demonstration, patient participation, and written materials to support subject.  Instructors address safety measures for doing things around the house.  Discuss how to get up and down off the floor, how to pick things up properly, how to safely get out of a chair without assistance, and balance training.   Meditation and Mindfulness:  -Group instruction provided by verbal instruction, patient participation,  and written materials to support subject.  Instructor addresses importance of mindfulness and meditation practice to help reduce stress and improve awareness.  Instructor also leads participants through a meditation exercise.    Stretching for Flexibility and Mobility:  -Group instruction provided by verbal instruction, patient participation, and written materials to support subject.  Instructors lead participants through series of stretches that are designed to increase flexibility thus improving mobility.  These stretches are additional exercise for major muscle groups that are typically performed during regular warm up and cool down.   Hands Only CPR:  -Group verbal, video, and participation provides a basic overview of AHA guidelines for community CPR. Role-play of emergencies allow participants the opportunity to practice calling for help and chest compression technique with discussion of AED use.   Hypertension: -Group verbal and written instruction that provides a basic overview of hypertension including the most recent diagnostic guidelines, risk factor reduction with self-care instructions and medication management.    Nutrition I class: Heart Healthy Eating:  -Group instruction provided by PowerPoint slides, verbal discussion, and written materials to support subject matter. The instructor gives an explanation and review of the Therapeutic Lifestyle Changes diet recommendations, which includes a discussion on lipid goals, dietary fat, sodium, fiber, plant stanol/sterol esters, sugar, and the components of a well-balanced, healthy diet.   Nutrition II class: Lifestyle Skills:  -Group instruction provided by PowerPoint slides, verbal discussion, and written materials to support subject matter. The instructor gives an explanation and review of label reading, grocery shopping for heart health, heart healthy recipe modifications, and ways to make healthier choices when eating  out.   Diabetes Question & Answer:  -Group instruction provided by PowerPoint slides, verbal discussion, and written materials to support subject matter. The instructor gives an explanation and review of diabetes co-morbidities, pre- and post-prandial blood glucose goals, pre-exercise blood glucose goals, signs, symptoms, and treatment of hypoglycemia and hyperglycemia, and foot care basics.   Diabetes Blitz:  -Group instruction provided by PowerPoint slides, verbal discussion, and written materials to support subject matter. The instructor gives an explanation and review of the physiology behind type 1 and type 2 diabetes, diabetes medications and rational behind using different medications, pre- and post-prandial blood glucose recommendations and Hemoglobin A1c goals, diabetes diet, and exercise including blood glucose guidelines for exercising safely.    Portion Distortion:  -Group instruction provided by PowerPoint slides, verbal discussion, written materials, and food models to support subject matter. The instructor gives an explanation of serving size versus portion size, changes in portions sizes over the last 20 years, and what consists of a serving from each food group.   Stress Management:  -Group instruction provided by verbal instruction, video, and written materials to support subject matter.  Instructors review role of stress in heart disease and how to cope with stress positively.     Exercising on Your Own:  -Group instruction provided by verbal instruction, power point, and written materials to support subject.  Instructors  discuss benefits of exercise, components of exercise, frequency and intensity of exercise, and end points for exercise.  Also discuss use of nitroglycerin and activating EMS.  Review options of places to exercise outside of rehab.  Review guidelines for sex with heart disease.   Cardiac Drugs I:  -Group instruction provided by verbal instruction and  written materials to support subject.  Instructor reviews cardiac drug classes: antiplatelets, anticoagulants, beta blockers, and statins.  Instructor discusses reasons, side effects, and lifestyle considerations for each drug class.   Cardiac Drugs II:  -Group instruction provided by verbal instruction and written materials to support subject.  Instructor reviews cardiac drug classes: angiotensin converting enzyme inhibitors (ACE-I), angiotensin II receptor blockers (ARBs), nitrates, and calcium channel blockers.  Instructor discusses reasons, side effects, and lifestyle considerations for each drug class.   Anatomy and Physiology of the Circulatory System:  Group verbal and written instruction and models provide basic cardiac anatomy and physiology, with the coronary electrical and arterial systems. Review of: AMI, Angina, Valve disease, Heart Failure, Peripheral Artery Disease, Cardiac Arrhythmia, Pacemakers, and the ICD.   Other Education:  -Group or individual verbal, written, or video instructions that support the educational goals of the cardiac rehab program.   Holiday Eating Survival Tips:  -Group instruction provided by PowerPoint slides, verbal discussion, and written materials to support subject matter. The instructor gives patients tips, tricks, and techniques to help them not only survive but enjoy the holidays despite the onslaught of food that accompanies the holidays.   Knowledge Questionnaire Score: Knowledge Questionnaire Score - 08/28/19 1038      Knowledge Questionnaire Score   Pre Score  22/24       Core Components/Risk Factors/Patient Goals at Admission: Personal Goals and Risk Factors at Admission - 08/28/19 1130      Core Components/Risk Factors/Patient Goals on Admission    Weight Management  Weight Maintenance;Yes    Intervention  Weight Management: Develop a combined nutrition and exercise program designed to reach desired caloric intake, while maintaining  appropriate intake of nutrient and fiber, sodium and fats, and appropriate energy expenditure required for the weight goal.    Expected Outcomes  Short Term: Continue to assess and modify interventions until short term weight is achieved;Long Term: Adherence to nutrition and physical activity/exercise program aimed toward attainment of established weight goal;Weight Maintenance: Understanding of the daily nutrition guidelines, which includes 25-35% calories from fat, 7% or less cal from saturated fats, less than 217m cholesterol, less than 1.5gm of sodium, & 5 or more servings of fruits and vegetables daily;Understanding recommendations for meals to include 15-35% energy as protein, 25-35% energy from fat, 35-60% energy from carbohydrates, less than 2040mof dietary cholesterol, 20-35 gm of total fiber daily;Understanding of distribution of calorie intake throughout the day with the consumption of 4-5 meals/snacks    Hypertension  Yes    Intervention  Provide education on lifestyle modifcations including regular physical activity/exercise, weight management, moderate sodium restriction and increased consumption of fresh fruit, vegetables, and low fat dairy, alcohol moderation, and smoking cessation.;Monitor prescription use compliance.    Expected Outcomes  Short Term: Continued assessment and intervention until BP is < 140/9027mG in hypertensive participants. < 130/53m59m in hypertensive participants with diabetes, heart failure or chronic kidney disease.;Long Term: Maintenance of blood pressure at goal levels.    Lipids  Yes    Intervention  Provide education and support for participant on nutrition & aerobic/resistive exercise along with prescribed medications to achieve LDL <70mg61m  HDL >23m.    Expected Outcomes  Short Term: Participant states understanding of desired cholesterol values and is compliant with medications prescribed. Participant is following exercise prescription and nutrition  guidelines.;Long Term: Cholesterol controlled with medications as prescribed, with individualized exercise RX and with personalized nutrition plan. Value goals: LDL < 770m HDL > 40 mg.    Stress  Yes    Intervention  Offer individual and/or small group education and counseling on adjustment to heart disease, stress management and health-related lifestyle change. Teach and support self-help strategies.;Refer participants experiencing significant psychosocial distress to appropriate mental health specialists for further evaluation and treatment. When possible, include family members and significant others in education/counseling sessions.    Expected Outcomes  Short Term: Participant demonstrates changes in health-related behavior, relaxation and other stress management skills, ability to obtain effective social support, and compliance with psychotropic medications if prescribed.;Long Term: Emotional wellbeing is indicated by absence of clinically significant psychosocial distress or social isolation.       Core Components/Risk Factors/Patient Goals Review:  Goals and Risk Factor Review    Row Name 09/10/19 1406             Core Components/Risk Factors/Patient Goals Review   Personal Goals Review  Weight Management/Obesity;Hypertension;Lipids;Stress       Review  Pt with multiple CAD RFs eager to participate in CR exercise. Ed would like to increase his stamina.       Expected Outcomes  Ed will continue to participate in CR exercise, nutrition, and lifestyle modification opportunities.          Core Components/Risk Factors/Patient Goals at Discharge (Final Review):  Goals and Risk Factor Review - 09/10/19 1406      Core Components/Risk Factors/Patient Goals Review   Personal Goals Review  Weight Management/Obesity;Hypertension;Lipids;Stress    Review  Pt with multiple CAD RFs eager to participate in CR exercise. Ed would like to increase his stamina.    Expected Outcomes  Ed will continue  to participate in CR exercise, nutrition, and lifestyle modification opportunities.       ITP Comments: ITP Comments    Row Name 08/28/19 1118 09/10/19 1359         ITP Comments  Dr TrFransico HimD, Medical Director  30 Day ITP Review.  Ed is tolerating starting exercise.  His vital signs remain stable.  His heart rhythm varies between SR and AF.  This is not new for Ed.         Comments: See ITP Comments.

## 2019-09-13 NOTE — Patient Instructions (Signed)
Medication Instructions:    Your physician recommends that you continue on your current medications as directed. Please refer to the Current Medication list given to you today.  - If you need a refill on your cardiac medications before your next appointment, please call your pharmacy.   Labwork:  None ordered  Testing/Procedures:  None ordered  Follow-Up:  Your physician recommends that you schedule a follow-up appointment in: 3 months with Dr. Camnitz.  Thank you for choosing [facility]!!   Simora Dingee, RN [phone]         

## 2019-09-13 NOTE — Progress Notes (Signed)
Electrophysiology Office Note   Date:  09/13/2019   ID:  NEMECIO MILNER, DOB 27-Feb-1936, MRN [id number]  PCP:  Burnard Bunting, MD  Cardiologist:  Aundra Dubin Primary Electrophysiologist:  Jaasia Viglione Meredith Leeds, MD    No chief complaint on file.    History of Present Illness: [name] is a 83 y.o. male who is being seen today for the evaluation of atrial fibrillation/flutter at the request of Burnard Bunting, MD. Presenting today for electrophysiology evaluation.  He has a history of persistent atrial fibrillation, coronary disease, COPD, CVA, OSA, hypertension, hyperlipidemia.  He was diagnosed with atrial fibrillation several years ago and is status post ablation in 2015 by Dr. Lehman Prom.  He had been on Tikosyn since that time.  On 219 he was in an ectopic atrial rhythm with 2-1 heart block and was cardioverted 12/11/2018.  He has sleep apnea and is on BiPAP.  He was seen by Dr. Radford Pax who found him to be out of rhythm and referred him to atrial fibrillation clinic.  His EKG appeared to be consistent with atypical atrial flutter.  He had atrial fibrillation/flutter ablation 06/08/2019.  Since that time, it appears that he has maintained sinus rhythm.  He did wear a cardiac monitor which showed short episodes of SVT.  He has been going to cardiac rehab, though on their 3-lead ECG they have been concerned and have not performed rehab a few times.  He does have episodes where he feels weak and fatigued.  During these episodes, his heart rates are in the 60s.  Today, denies symptoms of palpitations, chest pain, shortness of breath, orthopnea, PND, lower extremity edema, claudication, dizziness, presyncope, syncope, bleeding, or neurologic sequela. The patient is tolerating medications without difficulties.     Past Medical History:  Diagnosis Date  . Abnormal nuclear cardiac imaging test March 2011   Has positive EKG response, no perfusion defect and normal EF  . Adenomatous colon polyp 12/2002   . Arrhythmia   . Atrial fibrillation ([place])    a. s/p rfca;  b. chronic tikosyn and xarelto.  . 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 Cataract    bil cataracts removed  . Chronic diastolic CHF (congestive heart failure) ([place])    a. 04/2017 Echo: EF 65-70%, restrictive physiology, mildly dil LA.  [name] Complication of anesthesia   . COPD (chronic obstructive pulmonary disease) ([place])   . Coronary artery disease    First MI in 1988. PCI in 1996 with subsequent CABG in 1996 due to restenosis. S/P stents to LCX in 2009. Noted to have residual disease in the LAD in a diffuse manner and atretic LIMA graft. He is managed medically.   . Difficult intubation    06/08/19 update - Intubated with MAC3 with grade IIb view 7.0 tube passed easily  . Dysrhythmia    Afib  . Esophageal stricture   . GERD (gastroesophageal reflux disease)   . 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 History of Raynaud's syndrome   . Hyperlipidemia   . Hypertension   . Internal hemorrhoids   . Muscle spasm   . Myocardial infarction [facility])    MI x1 20 - 16 age  . Neuromuscular disorder ([place])    Polio - age 42  . OSA (obstructive sleep apnea) 10/16/2018   Severe OSA with AHI 37.2/hr on BiPAP  . Persistent atrial fibrillation   . Polio    age 64  .  PVC (premature ventricular contraction)   . Scoliosis    mild   Past Surgical History:  Procedure Laterality Date  . ABLATION     done for a fib  . APPENDECTOMY     Age 68  . ATRIAL FIBRILLATION ABLATION N/A 06/08/2019   Procedure: ATRIAL FIBRILLATION ABLATION;  Surgeon: Constance Haw, MD;  Location: [place] [facility];  Service: Cardiovascular;  Laterality: N/A;  . CARDIAC CATHETERIZATION    . CARDIOVERSION N/A 08/22/2013   Procedure: CARDIOVERSION;  Surgeon: Carlena Bjornstad, MD;  Location: [facility] ENDOSCOPY;  Service: Cardiovascular;  Laterality: N/A;  . CARDIOVERSION N/A 01/16/2014   Procedure:  CARDIOVERSION;  Surgeon: Lelon Perla, MD;  Location: [facility] ENDOSCOPY;  Service: Cardiovascular;  Laterality: N/A;  . CARDIOVERSION N/A 12/11/2018   Procedure: CARDIOVERSION;  Surgeon: Larey Dresser, MD;  Location: [facility] ENDOSCOPY;  Service: Cardiovascular;  Laterality: N/A;  . COLONOSCOPY  2008  . CORONARY ARTERY BYPASS GRAFT  1996   with a LIMA to the LAD, SVG to RCA and SVG to OM  . CORONARY STENT INTERVENTION N/A 03/26/2019   Procedure: CORONARY STENT INTERVENTION;  Surgeon: Jettie Booze, MD;  Location: [place] [facility];  Service: Cardiovascular;  Laterality: N/A;  . CORONARY STENT PLACEMENT  1996   LCX  . EMBOLECTOMY Right 05/09/2019   Procedure: Embolectomy of right radial artery;  Surgeon: Serafina Mitchell, MD;  Location: [place];  Service: Vascular;  Laterality: Right;  . ESOPHAGEAL DILATION    . EYE SURGERY    . FALSE ANEURYSM REPAIR Right 05/09/2019   Procedure: REPAIR FALSE ANEURYSM RIGHT RADIAL;  Surgeon: Serafina Mitchell, MD;  Location: [place];  Service: Vascular;  Laterality: Right;  . LEFT HEART CATH AND CORS/GRAFTS ANGIOGRAPHY N/A 03/26/2019   Procedure: LEFT HEART CATH AND CORS/GRAFTS ANGIOGRAPHY;  Surgeon: Larey Dresser, MD;  Location: [place] [facility];  Service: Cardiovascular;  Laterality: N/A;  . POLYPECTOMY    . TONSILLECTOMY    . UPPER GASTROINTESTINAL ENDOSCOPY     esophegeal dilation     Current Outpatient Medications  Medication Sig Dispense Refill  . acetaminophen (TYLENOL) 500 MG tablet Take 500 mg by mouth every 8 (eight) hours as needed for mild pain or moderate pain.     [name] albuterol (PROAIR HFA) 108 (90 BASE) MCG/ACT inhaler Inhale 2 puffs into the lungs every 4 (four) hours as needed for wheezing or shortness of breath (and prior to exercise). 1 Inhaler 3  . AMBULATORY NON FORMULARY MEDICATION Apply 1 application topically See admin instructions. Testosterone cream 150mg /15% Place 1 ml  onto the skin daily    . clopidogrel (PLAVIX) 75 MG tablet  Take 1 tablet (75 mg total) by mouth daily with breakfast. 30 tablet 6  . dicyclomine (BENTYL) 10 MG capsule Take 1 capsule (10 mg total) by mouth 3 (three) times daily before meals. 90 capsule 11  . dofetilide (TIKOSYN) 250 MCG capsule TAKE 1 CAPSULE BY MOUTH EVERY 12 HOURS *NEED OFFICE VISIT* 180 capsule 3  . ezetimibe (ZETIA) 10 MG tablet TAKE 1 TABLET BY MOUTH EVERY DAY 90 tablet 3  . famotidine (PEPCID) 40 MG tablet Take 1 tablet (40 mg total) by mouth at bedtime. 30 tablet 11  . furosemide (LASIX) 40 MG tablet Take 0.5 tablets (20 mg total) by mouth daily. 15 tablet 6  . lactobacillus acidophilus & bulgar (LACTINEX) chewable tablet Chew 2 tablets by mouth daily. (0800)    . loperamide (IMODIUM) 2 MG  capsule Take 2 mg by mouth 2 (two) times daily as needed for diarrhea or loose stools. One or two tablets daily as needed    . LORazepam (ATIVAN) 0.5 MG tablet Take 0.5 mg by mouth at bedtime.     . magnesium oxide (MAG-OX) 400 MG tablet Take 400 mg by mouth daily.    . methocarbamol (ROBAXIN) 500 MG tablet Take 500 mg by mouth 2 (two) times daily.     . nebivolol (BYSTOLIC) 10 MG tablet Take 0.5 tablets (5 mg total) by mouth daily. 90 tablet 1  . nitroGLYCERIN (NITROSTAT) 0.4 MG SL tablet Place 1 tablet (0.4 mg total) under the tongue every 5 (five) minutes as needed for chest pain. 25 tablet 0  . potassium chloride SA (K-DUR) 20 MEQ tablet Take 1 tablet (20 mEq total) by mouth at bedtime. 90 tablet 3  . Probiotic Product (ALIGN) 4 MG CAPS Take 4 mg by mouth daily.    . rosuvastatin (CRESTOR) 40 MG tablet TAKE 1 TABLET BY MOUTH EVERY DAY 90 tablet 3  . sodium chloride (OCEAN) 0.65 % SOLN nasal spray Place 1 spray into both nostrils as needed for congestion.    . traZODone (DESYREL) 50 MG tablet Take 50 mg by mouth at bedtime.    . Vitamin D, Ergocalciferol, (DRISDOL) 50000 UNITS CAPS capsule Take 50,000 Units by mouth every 14 (fourteen) days. Take on 1st and 15th    . XARELTO 15 MG TABS tablet  TAKE 1 TABLET EVERY EVENING 90 tablet 1   No current facility-administered medications for this visit.     Allergies:   Morphine and related and Tape   Social History:  The patient  reports that he quit smoking about 52 years ago. His smoking use included cigarettes. He has a 2.50 pack-year smoking history. He quit smokeless tobacco use about 70 years ago. He reports that he does not drink alcohol or use drugs.   Family History:  The patient's family history includes Cancer in his son; Heart Problems in his brother; Heart disease in his father, maternal grandfather, maternal grandmother, mother, paternal grandfather, and paternal grandmother; Prostate cancer in his paternal uncle.    ROS:  Please see the history of present illness.   Otherwise, review of systems is positive for none.   All other systems are reviewed and negative.   PHYSICAL EXAM: VS:  BP 130/64   Pulse 65   Ht 5' 6.25" (1.683 m)   Wt 122 lb 12.8 oz (55.7 kg)   SpO2 98%   BMI 19.67 kg/m  , BMI Body mass index is 19.67 kg/m. GEN: Well nourished, well developed, in no acute distress  HEENT: normal  Neck: no JVD, carotid bruits, or masses Cardiac: RRR; no murmurs, rubs, or gallops,no edema  Respiratory:  clear to auscultation bilaterally, normal work of breathing GI: soft, nontender, nondistended, + BS MS: no deformity or atrophy  Skin: warm and dry Neuro:  Strength and sensation are intact Psych: euthymic mood, full affect  EKG:  EKG is ordered today. Personal review of the ekg ordered shows sinus rhythm, rate 65, QTC 463 ms  Recent Labs: 01/04/2019: ALT 26 08/21/2019: BUN 23; Creatinine, Ser 1.53; Hemoglobin 15.0; Magnesium 2.4; Platelets 189; Potassium 4.7; Sodium 137    Lipid Panel     Component Value Date/Time   CHOL 111 03/27/2019 0333   TRIG 53 03/27/2019 0333   HDL 47 03/27/2019 0333   CHOLHDL 2.4 03/27/2019 0333   VLDL 11 03/27/2019 0333  LDLCALC 53 03/27/2019 0333     Wt Readings from Last 3  Encounters:  09/13/19 122 lb 12.8 oz (55.7 kg)  08/28/19 121 lb 7.6 oz (55.1 kg)  08/22/19 116 lb 9.6 oz (52.9 kg)      Other studies Reviewed: Additional studies/ records that were reviewed today include: TTE 12/29/18  Review of the above records today demonstrates:  - Left ventricle: The cavity size was normal. Systolic function was   normal. The estimated ejection fraction was in the range of 55%   to 60%. Hypokinesis of the basalanteroseptal and inferoseptal   myocardium. The study is not technically sufficient to allow   evaluation of LV diastolic function. - Aortic valve: Valve mobility was restricted. Transvalvular   velocity was within the normal range. There was no stenosis.   There was no regurgitation. Mean gradient (S): 8 mm Hg. Valve   area (VTI): 1.28 cm^2. Valve area (Vmax): 1.28 cm^2. Valve area   (Vmean): 1.26 cm^2. - Mitral valve: Transvalvular velocity was within the normal range.   There was no evidence for stenosis. There was mild regurgitation. - Left atrium: The atrium was moderately dilated. - Right ventricle: The cavity size was normal. Wall thickness was   normal. Systolic function was normal. - Tricuspid valve: There was mild regurgitation. - Pulmonary arteries: Systolic pressure was within the normal   range. PA peak pressure: 35 mm Hg (S).   ASSESSMENT AND PLAN:  1.  Persistent atrial fibrillation/atypical atrial flutter: Currently on Xarelto, dofetilide, diltiazem, Bystolic.  Had an ablation 06/08/2019 for recurrent atrial fibrillation and atrial flutter.  He had multiple atrial flutter circuits.  It appears that he has remained out of atrial fibrillation and atrial flutter since ablation.  He has had a few issues at cardiac rehab.  I Yaquelin Langelier discuss this with his primary cardiologist.  This patients CHA2DS2-VASc Score and unadjusted Ischemic Stroke Rate (% per year) is equal to 9.7 % stroke rate/year from a score of 6  Above score calculated as 1 point  each if present [CHF, HTN, DM, Vascular=MI/PAD/Aortic Plaque, Age if 65-74, or Male] Above score calculated as 2 points each if present [Age > 75, or Stroke/TIA/TE]  2.  Obstructive sleep apnea: Cannot tolerate facemask.  Has been referred for an oral appliance.  Compliance encouraged.    3.  Coronary artery disease: That is post CABG.  No current chest pain.  4.  Hypertension: Currently well controlled    Current medicines are reviewed at length with the patient today.   The patient does not have concerns regarding his medicines.  The following changes were made today: None  Labs/ tests ordered today include:  Orders Placed This Encounter  Procedures  . EKG 12-Lead     Disposition:   FU with [place] 3 months  Signed, Ahana Najera Meredith Leeds, MD  09/13/2019 10:30 AM     [facility] [address] [place] [address] [phone] (office) [phone] (fax)

## 2019-09-14 ENCOUNTER — Other Ambulatory Visit (HOSPITAL_COMMUNITY): Payer: Self-pay | Admitting: Adult Health

## 2019-09-14 ENCOUNTER — Encounter (HOSPITAL_COMMUNITY): Payer: Medicare Other

## 2019-09-14 ENCOUNTER — Other Ambulatory Visit: Payer: Self-pay | Admitting: Cardiology

## 2019-09-17 ENCOUNTER — Encounter (HOSPITAL_COMMUNITY)
Admission: RE | Admit: 2019-09-17 | Discharge: 2019-09-17 | Disposition: A | Discharge status: Home / Self Care | Payer: Medicare Other | Source: Ambulatory Visit | Attending: Cardiology | Admitting: Cardiology

## 2019-09-17 ENCOUNTER — Encounter (HOSPITAL_COMMUNITY): Payer: Medicare Other

## 2019-09-17 ENCOUNTER — Other Ambulatory Visit: Payer: Self-pay

## 2019-09-17 DIAGNOSIS — I214 Non-ST elevation (NSTEMI) myocardial infarction: Secondary | ICD-10-CM | POA: Diagnosis not present

## 2019-09-17 DIAGNOSIS — Z955 Presence of coronary angioplasty implant and graft: Secondary | ICD-10-CM

## 2019-09-19 ENCOUNTER — Encounter (HOSPITAL_COMMUNITY): Payer: Medicare Other

## 2019-09-21 ENCOUNTER — Encounter (HOSPITAL_COMMUNITY)
Admission: RE | Admit: 2019-09-21 | Discharge: 2019-09-21 | Disposition: A | Discharge status: Home / Self Care | Payer: Medicare Other | Source: Ambulatory Visit | Attending: Cardiology | Admitting: Cardiology

## 2019-09-21 ENCOUNTER — Encounter (HOSPITAL_COMMUNITY): Payer: Medicare Other

## 2019-09-21 ENCOUNTER — Other Ambulatory Visit: Payer: Self-pay

## 2019-09-21 DIAGNOSIS — I214 Non-ST elevation (NSTEMI) myocardial infarction: Secondary | ICD-10-CM

## 2019-09-21 DIAGNOSIS — Z955 Presence of coronary angioplasty implant and graft: Secondary | ICD-10-CM | POA: Diagnosis not present

## 2019-09-24 ENCOUNTER — Encounter (HOSPITAL_COMMUNITY): Payer: Medicare Other

## 2019-09-26 ENCOUNTER — Encounter (HOSPITAL_COMMUNITY)
Admission: RE | Admit: 2019-09-26 | Discharge: 2019-09-26 | Disposition: A | Discharge status: Home / Self Care | Payer: Medicare Other | Source: Ambulatory Visit | Attending: Cardiology | Admitting: Cardiology

## 2019-09-26 ENCOUNTER — Encounter (HOSPITAL_COMMUNITY): Payer: Medicare Other

## 2019-09-26 ENCOUNTER — Other Ambulatory Visit: Payer: Self-pay

## 2019-09-26 DIAGNOSIS — I214 Non-ST elevation (NSTEMI) myocardial infarction: Secondary | ICD-10-CM

## 2019-09-26 DIAGNOSIS — Z955 Presence of coronary angioplasty implant and graft: Secondary | ICD-10-CM

## 2019-09-28 ENCOUNTER — Encounter (HOSPITAL_COMMUNITY): Payer: Medicare Other

## 2019-10-01 ENCOUNTER — Encounter (HOSPITAL_COMMUNITY)
Admission: RE | Admit: 2019-10-01 | Discharge: 2019-10-01 | Disposition: A | Discharge status: Home / Self Care | Payer: Medicare Other | Source: Ambulatory Visit | Attending: Cardiology | Admitting: Cardiology

## 2019-10-01 ENCOUNTER — Encounter (HOSPITAL_COMMUNITY): Payer: Medicare Other

## 2019-10-01 ENCOUNTER — Other Ambulatory Visit: Payer: Self-pay

## 2019-10-01 DIAGNOSIS — I214 Non-ST elevation (NSTEMI) myocardial infarction: Secondary | ICD-10-CM | POA: Diagnosis not present

## 2019-10-01 DIAGNOSIS — Z955 Presence of coronary angioplasty implant and graft: Secondary | ICD-10-CM | POA: Insufficient documentation

## 2019-10-03 ENCOUNTER — Encounter (HOSPITAL_COMMUNITY)
Admission: RE | Admit: 2019-10-03 | Discharge: 2019-10-03 | Disposition: A | Discharge status: Home / Self Care | Payer: Medicare Other | Source: Ambulatory Visit | Attending: Cardiology | Admitting: Cardiology

## 2019-10-03 ENCOUNTER — Ambulatory Visit (HOSPITAL_BASED_OUTPATIENT_CLINIC_OR_DEPARTMENT_OTHER): Payer: Medicare Other | Attending: Cardiology | Admitting: Cardiology

## 2019-10-03 ENCOUNTER — Encounter (HOSPITAL_COMMUNITY): Payer: Medicare Other

## 2019-10-03 ENCOUNTER — Other Ambulatory Visit: Payer: Self-pay

## 2019-10-03 DIAGNOSIS — G4733 Obstructive sleep apnea (adult) (pediatric): Secondary | ICD-10-CM | POA: Diagnosis not present

## 2019-10-03 DIAGNOSIS — Z955 Presence of coronary angioplasty implant and graft: Secondary | ICD-10-CM

## 2019-10-03 DIAGNOSIS — I214 Non-ST elevation (NSTEMI) myocardial infarction: Secondary | ICD-10-CM | POA: Diagnosis not present

## 2019-10-05 ENCOUNTER — Encounter (HOSPITAL_COMMUNITY): Payer: Medicare Other

## 2019-10-05 ENCOUNTER — Telehealth: Payer: Self-pay | Admitting: Cardiology

## 2019-10-05 NOTE — Telephone Encounter (Signed)
Wife of patient wanted to know when the patient's sleep study results would be ready. The patient has an appointment with [name] on 10/15, so the results can be discussed with the patient at that appointment. She states that in the past with the hospital sleep studies, the results can take almost a month to come back. This recent sleep study was done at home and so she does not know how soon those results will come back.  If you are unable to reach her on her home phone, you can call her cell phone

## 2019-10-07 NOTE — Procedures (Signed)
   Patient Name: [name], [place] Date: 10/03/2019 Gender: Male D.O.B: 1936/05/21 Age (years): 35 Referring Provider: [name] Height (inches): 67 Interpreting Physician: Fransico Him MD, ABSM Weight (lbs): 120 RPSGT: Jonna Coup BMI: 19 MRN: [id number] Neck Size: 13.50  CLINICAL INFORMATION Sleep Study Type: HST using oral device.  Indication for sleep study: OSA  Epworth Sleepiness Score: 9  Most recent polysomnogram dated 07/05/2018 revealed an AHI of 11.5/h and RDI of 16.8/h. Most recent titration study dated 07/05/2018 revealed an AHI of 5.7/h.  SLEEP STUDY TECHNIQUE A multi-channel overnight portable sleep study was performed. The channels recorded were: nasal airflow, thoracic respiratory movement, and oxygen saturation with a pulse oximetry. Snoring was also monitored.  MEDICATIONS Patient self administered medications include: ALBUTEROL, ATIVAN, CRESTOR, ROBAXIN, TRAZODONE, TYLENOL, ZANTAC.  SLEEP ARCHITECTURE Patient was studied for 410.4 minutes. The sleep efficiency was 99.8 % and the patient was supine for 92.6%. The arousal index was 0.0 per hour.  RESPIRATORY PARAMETERS The overall AHI was 9.5 per hour, with a central apnea index of 0.0 per hour.  The oxygen nadir was 86% during sleep. The time spent with O2 sats <89% was 0.3 minutes.  CARDIAC DATA Mean heart rate during sleep was 83.1 bpm.  IMPRESSIONS - Mild obstructive sleep apnea occurred during this study (AHI = 9.5/h). - No significant central sleep apnea occurred during this study (CAI = 0.0/h). - Mild oxygen desaturation was noted during this study (Min O2 = 86%). - Patient snored 17.4% during the sleep.  DIAGNOSIS - Obstructive Sleep Apnea (327.23 [G47.33 ICD-10])  RECOMMENDATIONS - Recommend further titration of oral device to attain an AHI < 5/hr. - Positional therapy avoiding supine position during sleep. - Surgical consultation for Uvulopalatopharyngoplasty (UPPP) may be  considered. - Avoid alcohol, sedatives and other CNS depressants that may worsen sleep apnea and disrupt normal sleep architecture. - Sleep hygiene should be reviewed to assess factors that may improve sleep quality. - Weight management and regular exercise should be initiated or continued. - No supplemental oxygen needed at this time.  [Electronically signed] 10/07/2019 02:36 PM  Fransico Him MD, ABSM Diplomate, American Board of Sleep Medicine

## 2019-10-08 ENCOUNTER — Telehealth: Payer: Self-pay | Admitting: *Deleted

## 2019-10-08 ENCOUNTER — Encounter (HOSPITAL_COMMUNITY): Payer: Medicare Other

## 2019-10-08 ENCOUNTER — Encounter (HOSPITAL_COMMUNITY)
Admission: RE | Admit: 2019-10-08 | Discharge: 2019-10-08 | Disposition: A | Discharge status: Home / Self Care | Payer: Medicare Other | Source: Ambulatory Visit | Attending: Cardiology | Admitting: Cardiology

## 2019-10-08 ENCOUNTER — Other Ambulatory Visit: Payer: Self-pay

## 2019-10-08 VITALS — BP 130/64 | HR 61 | Temp 97.7°F | Ht 66.25 in | Wt 121.0 lb

## 2019-10-08 DIAGNOSIS — I214 Non-ST elevation (NSTEMI) myocardial infarction: Secondary | ICD-10-CM | POA: Diagnosis not present

## 2019-10-08 DIAGNOSIS — Z955 Presence of coronary angioplasty implant and graft: Secondary | ICD-10-CM

## 2019-10-08 NOTE — Telephone Encounter (Signed)
Informed patient/Wife of sleep study results and patient understanding was verbalized. Patient/Wife understands his sleep study showed persistent sleep apnea despite oral device with AHI 9/hr with goal < 5/hr. Please refer him back to Dr. Toy Cookey for titration of his device. Also he did not qualify for supplemental O2. Please forward study to Dr. Toy Cookey. Pt /Wife is aware and agreeable to these results. Patient has been referred back to Dr Toy Cookey for titration of his device and a copy of his sleep study has been faxed to Dr Toy Cookey.

## 2019-10-08 NOTE — Telephone Encounter (Signed)
-----   Message from Sueanne Margarita, MD sent at 10/07/2019  2:40 PM EDT ----- Please let patient know that home sleep study showed persistent sleep apnea despite oral device with AHI 9/hr with goal < 5/hr.  Please refer him back to Dr. Toy Cookey for titration of his device.  Also he did not qualify for supplemental O2.  Please forward study to Dr. Toy Cookey

## 2019-10-09 ENCOUNTER — Telehealth: Payer: Self-pay | Admitting: *Deleted

## 2019-10-09 NOTE — Telephone Encounter (Signed)
   Primary Cardiologist: [name]  Chart reviewed as part of pre-operative protocol coverage. Simple dental extractions are considered low risk procedures per guidelines and generally do not require any specific cardiac clearance. It is also generally accepted that for simple extractions and dental cleanings, there is no need to interrupt blood thinner therapy.   SBE prophylaxis is not required for the patient.  I will route this recommendation to the requesting party via Epic fax function and remove from pre-op pool.  Please call with questions. Case reviewed by our clinical pharmacist, recommend continuing Xarelto through the procedure. No SBE prophylaxis needed.   [place], [place] 10/09/2019, 4:02 PM

## 2019-10-09 NOTE — Telephone Encounter (Signed)
   [facility] Pre-operative Risk Assessment    Request for surgical clearance:  1. What type of surgery is being performed? TWO TEETH TO BE EXTRACTED   2. When is this surgery scheduled? TBD   3. What type of clearance is required (medical clearance vs. Pharmacy clearance to hold med vs. Both)? BOTH  4. Are there any medications that need to be held prior to surgery and how long? [place]   5. Practice name and name of physician performing surgery? [facility]; DR. Feliberto Harts, DDS   6. What is your office phone number [phone]    7.   What is your office fax number [phone]  8.   Anesthesia type (None, local, MAC, general) ? NOT LISTED    Julaine Hua 10/09/2019, 10:03 AM  _________________________________________________________________   (provider comments below)

## 2019-10-09 NOTE — Telephone Encounter (Signed)
Pt takes Xarelto for afib with CHADS2VASc score of 7 (age x2, CHF, HTN, stroke, CAD). Recommend continuing Xarelto for 2 dental extractions due to elevated cardiac risk. Pt does not require dental prophylaxis.

## 2019-10-10 ENCOUNTER — Encounter (HOSPITAL_COMMUNITY): Payer: Medicare Other

## 2019-10-10 ENCOUNTER — Encounter (HOSPITAL_COMMUNITY)
Admission: RE | Admit: 2019-10-10 | Discharge: 2019-10-10 | Disposition: A | Discharge status: Home / Self Care | Payer: Medicare Other | Source: Ambulatory Visit | Attending: Cardiology | Admitting: Cardiology

## 2019-10-10 ENCOUNTER — Other Ambulatory Visit: Payer: Self-pay

## 2019-10-10 DIAGNOSIS — Z955 Presence of coronary angioplasty implant and graft: Secondary | ICD-10-CM

## 2019-10-10 DIAGNOSIS — I214 Non-ST elevation (NSTEMI) myocardial infarction: Secondary | ICD-10-CM

## 2019-10-10 NOTE — Telephone Encounter (Signed)
[name] returned my call and informed me that their office did receive the clearance.

## 2019-10-10 NOTE — Telephone Encounter (Signed)
Called the requesting office to confirm if cardiac clearance was received. Was told that Dr. Nicki Reaper and his staff are in a procedure and will ask if any of them may have gotten the fax

## 2019-10-10 NOTE — Progress Notes (Signed)
Incomplete Session Note  Patient Details  Name: [name] MRN: [id number] Date of Birth: 1936/09/12 Referring Provider:     CARDIAC REHAB PHASE II ORIENTATION from 08/28/2019 in [place]  Referring Provider  Dr. [name] did not complete his rehab session.  Patient reported that he did not feel up to exercising today after a few minutes on the nustep. Blood pressure 104/64 heart rate 71. Oxygen saturation 96 on room air. Patient plans to return to exercise on Friday.Barnet Pall, RN,BSN 10/10/2019 2:39 PM

## 2019-10-11 ENCOUNTER — Encounter (HOSPITAL_COMMUNITY): Payer: Self-pay | Admitting: Cardiology

## 2019-10-11 ENCOUNTER — Other Ambulatory Visit: Payer: Self-pay

## 2019-10-11 ENCOUNTER — Ambulatory Visit (HOSPITAL_COMMUNITY)
Admission: RE | Admit: 2019-10-11 | Discharge: 2019-10-11 | Disposition: A | Discharge status: Home / Self Care | Payer: Medicare Other | Source: Ambulatory Visit | Attending: Cardiology | Admitting: Cardiology

## 2019-10-11 VITALS — BP 140/64 | HR 66 | Wt 120.4 lb

## 2019-10-11 DIAGNOSIS — N183 Chronic kidney disease, stage 3 unspecified: Secondary | ICD-10-CM | POA: Diagnosis not present

## 2019-10-11 DIAGNOSIS — I480 Paroxysmal atrial fibrillation: Secondary | ICD-10-CM | POA: Diagnosis not present

## 2019-10-11 DIAGNOSIS — Z7902 Long term (current) use of antithrombotics/antiplatelets: Secondary | ICD-10-CM | POA: Insufficient documentation

## 2019-10-11 DIAGNOSIS — Z885 Allergy status to narcotic agent status: Secondary | ICD-10-CM | POA: Insufficient documentation

## 2019-10-11 DIAGNOSIS — Z79899 Other long term (current) drug therapy: Secondary | ICD-10-CM | POA: Diagnosis not present

## 2019-10-11 DIAGNOSIS — Z87891 Personal history of nicotine dependence: Secondary | ICD-10-CM | POA: Insufficient documentation

## 2019-10-11 DIAGNOSIS — G4733 Obstructive sleep apnea (adult) (pediatric): Secondary | ICD-10-CM | POA: Insufficient documentation

## 2019-10-11 DIAGNOSIS — R0602 Shortness of breath: Secondary | ICD-10-CM | POA: Diagnosis present

## 2019-10-11 DIAGNOSIS — Z951 Presence of aortocoronary bypass graft: Secondary | ICD-10-CM | POA: Insufficient documentation

## 2019-10-11 DIAGNOSIS — I5032 Chronic diastolic (congestive) heart failure: Secondary | ICD-10-CM | POA: Insufficient documentation

## 2019-10-11 DIAGNOSIS — I2510 Atherosclerotic heart disease of native coronary artery without angina pectoris: Secondary | ICD-10-CM | POA: Diagnosis not present

## 2019-10-11 DIAGNOSIS — K219 Gastro-esophageal reflux disease without esophagitis: Secondary | ICD-10-CM | POA: Insufficient documentation

## 2019-10-11 DIAGNOSIS — I484 Atypical atrial flutter: Secondary | ICD-10-CM | POA: Insufficient documentation

## 2019-10-11 DIAGNOSIS — Z955 Presence of coronary angioplasty implant and graft: Secondary | ICD-10-CM | POA: Diagnosis not present

## 2019-10-11 DIAGNOSIS — I255 Ischemic cardiomyopathy: Secondary | ICD-10-CM | POA: Diagnosis not present

## 2019-10-11 DIAGNOSIS — Z7901 Long term (current) use of anticoagulants: Secondary | ICD-10-CM | POA: Insufficient documentation

## 2019-10-11 DIAGNOSIS — I252 Old myocardial infarction: Secondary | ICD-10-CM | POA: Insufficient documentation

## 2019-10-11 DIAGNOSIS — Z8249 Family history of ischemic heart disease and other diseases of the circulatory system: Secondary | ICD-10-CM | POA: Diagnosis not present

## 2019-10-11 DIAGNOSIS — E785 Hyperlipidemia, unspecified: Secondary | ICD-10-CM | POA: Insufficient documentation

## 2019-10-11 DIAGNOSIS — I4891 Unspecified atrial fibrillation: Secondary | ICD-10-CM | POA: Diagnosis not present

## 2019-10-11 DIAGNOSIS — Z8673 Personal history of transient ischemic attack (TIA), and cerebral infarction without residual deficits: Secondary | ICD-10-CM | POA: Insufficient documentation

## 2019-10-11 DIAGNOSIS — Z23 Encounter for immunization: Secondary | ICD-10-CM | POA: Diagnosis not present

## 2019-10-11 LAB — CBC
HCT: 47 % (ref 39.0–52.0)
Hemoglobin: 14.7 g/dL (ref 13.0–17.0)
MCH: 31.7 pg (ref 26.0–34.0)
MCHC: 31.3 g/dL (ref 30.0–36.0)
MCV: 101.3 fL — ABNORMAL HIGH (ref 80.0–100.0)
Platelets: 159 10*3/uL (ref 150–400)
RBC: 4.64 MIL/uL (ref 4.22–5.81)
RDW: 13.1 % (ref 11.5–15.5)
WBC: 6.4 10*3/uL (ref 4.0–10.5)
nRBC: 0 % (ref 0.0–0.2)

## 2019-10-11 LAB — BASIC METABOLIC PANEL
Anion gap: 8 (ref 5–15)
BUN: 22 mg/dL (ref 8–23)
CO2: 25 mmol/L (ref 22–32)
Calcium: 10.1 mg/dL (ref 8.9–10.3)
Chloride: 104 mmol/L (ref 98–111)
Creatinine, Ser: 1.42 mg/dL — ABNORMAL HIGH (ref 0.61–1.24)
GFR calc Af Amer: 53 mL/min — ABNORMAL LOW (ref >60)
GFR calc non Af Amer: 46 mL/min — ABNORMAL LOW (ref >60)
Glucose, Bld: 74 mg/dL (ref 70–99)
Potassium: 4.9 mmol/L (ref 3.5–5.1)
Sodium: 137 mmol/L (ref 135–145)

## 2019-10-11 LAB — MAGNESIUM: Magnesium: 2.4 mg/dL (ref 1.7–2.4)

## 2019-10-11 NOTE — Progress Notes (Signed)
ID:  [name], DOB Dec 01, 1936, MRN [id number]    Provider location: [place] [facility] Type of Visit: Established patient   PCP:  Burnard Bunting, MD  Cardiologist:  [name]  Chief Complaint: Shortness of breath   History of Present Illness: [name] is a 83 y.o. male who has history of CAD s/p CABG.  Cath in 11/09 showed that the SVG-distal RCA was patent, the CFX system was patent.  His LIMA was atretic and there were serial 60% and 80% stenoses in the native LAD.  He was managed medically.  Echo in 8/14 showed EF 55-60% with moderate MR and moderate TR.   He was initially noted to have atrial fibrillation in the summer of 2014. He was started on Xarelto and cardioverted to NSR in 8/14.  Recurrent atrial fibrillation was noted in 1/15, and he was cardioverted to NSR again.  This time, NSR did not hold long. By 5/15, he was in persistent atrial fibrillation.  I referred him to [facility] where he had atrial fibrillation ablation and Tikosyn loading.  Ranolazine was stopped due to risk of QT prolongation and Imdur was started as an anti-anginal instead.  Lexiscan Cardiolite in 11/15 showed no ischemia.    CTA chest done in 2/16 to look for evidence for PV stenosis post-AF ablation.  This showed mild short-segment narrowing of the left inferior pulmonary vein.   Patient was admitted in 5/18 with acute on chronic diastolic CHF.  He had been at the beach for [place] days and ate out a number of times, probably getting a significant sodium load.  No chest pain.  He was in normal sinus rhythm.  He was started on IV Lasix and diuresed.  Echo in 5/18 showed EF 65-70% with moderately dilated RV and normal systolic function, PASP 57 mmHg.  Lexiscan Cardiolite in 6/18 showed no significant perfusion defect.   He was admitted with NSTEMI in 3/20, had DES to ostial LAD and mid SVG-RCA.  Echo showed EF 45-50%.  He was in atypical atrial flutter persistently despite Tikosyn.   After his intervention, he was noted to have a radial artery pseudoaneurysm that was repaired by Dr. Trula Slade.   In 6/20, he had a redo atrial fibrillation ablation.  He was also noted to have complex flutter from multiple foci that was not ablated.  After the procedure, he has had considerable pain in the right leg at the cath site but also radiating down the leg, groin US showed no AV fistula or pseudoaneurysm.    He returns for followup of CHF, atrial fibrillation, and CAD.  Symptomatically, he is stable.  He is in NSR today and feels like he has been in NSR for the most part recently.  No chest pain. He has chronic dyspnea with fast walking or heavy exertion.  No dyspnea with normal walking on flat ground.  No orthopnea/PND.  He is doing cardiac rehab.  Occasional lightheadedness with fast standing.  No BRBPR/melena.    ECG (personally reviewed): NSR, very low voltage P waves, QTc 422 msec  Labs (8/12): K 4.1, creatinine 1.2, LDL 91, HDL 53 Labs (8/14): K 4.6, creatinine 1.1 Labs (11/14): K 4.6, creatinine 1.2, LDL 91, HDL 56 Labs (3/15): AST 38, ALT 32, TSH normal, BNP 237 Labs (7/15): LDL 96, HDL 48, K 4.2, creatinine 1.2, TSH normal, BNP 237, AST 38, ALT 32 Labs (8/15): LFTs normal Labs (11/15): K 4.2, creatinine 1.1, Mg 2.2, BNP  227 Labs (2/16): K 4, creatinine 1.32, BNP 261 Labs (6/18): K 3.9, creatinine 1.6 Labs (7/18): K 3.9, creatinine 1.5, BNP 242 Labs (12/18): K 3.8, creatinine 1.4, hgb 13.9, LDL 73, HDL 52 Labs (3/19): K 3.7, creatinine 1.4, LDL 76, HDL 42 Labs (6/19): K 4.1, creatinine 1.63 Labs (3/20): K 4.2, creatinine 1.29, LDL 53, HDL 47, hgb 13.8 Labs (4/20): K 4.3, creatinine 1.5 Labs (6/20): K 4.2, creatinine 1.84, hgb 12.4 Labs (7/20): K 4.5, creatinine 2.53 => 1.72 Labs (8/20): K 4.7, creatinine 1.5, Mg 2.4  PMH: 1. CAD: 1st MI in 1988.  CABG 1996.  PCI to CFX in 2009.  LHC (11/09) SVG-dRCA patent, total occlusion RCA, patent CFX stent, atretic LIMA, serial 60 and  80% proximal LAD stenoses, EF 60% with basal inferior hypokinesis.  Myoview in 2011 with no ischemia or infarction.  Echo (10/12) with EF 55-60%, mild LVH, mild MR.  Lexiscan Cardiolite in 2013 with no ischemia or infarction.  Echo (8/14) with EF 55-60%, moderate MR, moderate TR, PA systolic pressure 35 mmHg.  Echo (4/15) with EF 60-65%, mild focal basal septal hypertrophy, inferior basal akinesis, mild MR.  Lexiscan cardiolite (11/15) with EF 61%, fixed basal inferoseptal defect, no ischemia.  - Lexiscan Cardiolite (6/18): EF 54%, no perfusion defect.  - NSTEMI 3/20, cath showed 95% ostial/proximal LAD, 60-70% mid LAD, totally occluded SVG-LCx, totally occluded RCA, 80-90% mid SVG-RCA.  Patient had DES to ostial-mid LAD and DES to SVG-RCA.  2. Raynauds syndrome 3. Post-polio syndrome 4. GERD with dilation of esophageal stricture in 12/12.  5. Hyperlipidemia 6. Brainstem stroke with Horner's syndrome.  7. Scoliosis. 8. H/o appendectomy 9. Herpes Zoster 10. Atrial fibrillation: DCCV to NSR in 8/14. DCCV to NSR in 1/15. Atrial fibrillation ablation 6/15 with Tikosyn loading (at [facility]).   - Redo atrial fibrillation ablation in 6/20.  Patient also noted to have complex flutter with several foci, not ablated.  - Zio patch (7/20): Primarily NSR with a few short SVT runs.  11. PFTs (4/15) with FVC 59%, FEV1 54%, ratio 91%, DLCO 53% => moderate obstructive defect thought to be related to COPD and severe restrictive defect thought to be due to elevated left hemidiaphragm and post-polio syndrome.  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  Echo (5/18) with EF 65-70%, RV moderately dilated with normal systolic function, PASP 57 mmHg.  - Echo (3/20): EF 45-50%, mild LV dilation, basal inferolateral and inferior hypokinesis.  13. CKD: Stage 3.  14. OSA: dental appliance.  15. Right radial artery pseudoaneurysm: post-cath in 3/20, s/p repair.  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 acetaminophen (TYLENOL) 500 MG tablet Take 500 mg by mouth every 8 (eight) hours as needed for mild pain or moderate pain.     [name] albuterol (PROAIR HFA) 108 (90 BASE) MCG/ACT inhaler Inhale 2 puffs into the lungs every 4 (four) hours as needed for wheezing or shortness of breath (and prior to exercise). 1 Inhaler 3  . AMBULATORY NON FORMULARY MEDICATION Apply 1 application topically See admin instructions. Testosterone cream 150mg /15% Place 1 ml  onto the skin daily    . clopidogrel (PLAVIX) 75 MG tablet TAKE 1 TABLET (75 MG TOTAL) BY MOUTH DAILY WITH BREAKFAST. 90 tablet 2  . dicyclomine (BENTYL) 10 MG capsule Take 10 mg by mouth 3 (three) times daily as needed for spasms.    [name] dofetilide (TIKOSYN) 250 MCG capsule TAKE 1 CAPSULE BY MOUTH EVERY 12 HOURS *NEED OFFICE VISIT*  180 capsule 3  . ezetimibe (ZETIA) 10 MG tablet TAKE 1 TABLET BY MOUTH EVERY DAY 90 tablet 3  . famotidine (PEPCID) 40 MG tablet Take 1 tablet (40 mg total) by mouth at bedtime. 30 tablet 11  . furosemide (LASIX) 40 MG tablet Take 0.5 tablets (20 mg total) by mouth daily. 15 tablet 6  . lactobacillus acidophilus & bulgar (LACTINEX) chewable tablet Chew 2 tablets by mouth daily. (0800)    . loperamide (IMODIUM) 2 MG capsule Take 2 mg by mouth 2 (two) times daily as needed for diarrhea or loose stools. One or two tablets daily as needed    . LORazepam (ATIVAN) 0.5 MG tablet Take 0.5 mg by mouth at bedtime.     . magnesium oxide (MAG-OX) 400 MG tablet Take 400 mg by mouth daily.    . methocarbamol (ROBAXIN) 500 MG tablet Take 500 mg by mouth 2 (two) times daily.     . nebivolol (BYSTOLIC) 10 MG tablet Take 0.5 tablets (5 mg total) by mouth daily. 90 tablet 1  . nitroGLYCERIN (NITROSTAT) 0.4 MG SL tablet Place 1 tablet (0.4 mg total) under the tongue every 5 (five) minutes as needed for chest pain. 25 tablet 0  . potassium chloride SA (K-DUR) 20 MEQ tablet Take 1 tablet (20 mEq total) by mouth at bedtime. 90 tablet  3  . Probiotic Product (ALIGN) 4 MG CAPS Take 4 mg by mouth daily.    . rosuvastatin (CRESTOR) 40 MG tablet TAKE 1 TABLET BY MOUTH EVERY DAY 90 tablet 3  . sodium chloride (OCEAN) 0.65 % SOLN nasal spray Place 1 spray into both nostrils as needed for congestion.    . traZODone (DESYREL) 50 MG tablet Take 50 mg by mouth at bedtime.    . Vitamin D, Ergocalciferol, (DRISDOL) 50000 UNITS CAPS capsule Take 50,000 Units by mouth every 14 (fourteen) days. Take on 1st and 15th    . XARELTO 15 MG TABS tablet TAKE 1 TABLET BY MOUTH EVERY DAY IN THE EVENING 90 tablet 1   No current facility-administered medications for this encounter.     Allergies:   Morphine and related and Tape   Social History:  The patient  reports that he quit smoking about 52 years ago. His smoking use included cigarettes. He has a 2.50 pack-year smoking history. He quit smokeless tobacco use about 70 years ago. He reports that he does not drink alcohol or use drugs.   Family History:  The patient's family history includes Cancer in his son; Heart Problems in his brother; Heart disease in his father, maternal grandfather, maternal grandmother, mother, paternal grandfather, and paternal grandmother; Prostate cancer in his paternal uncle.   ROS:  Please see the history of present illness.   All other systems are personally reviewed and negative.   Exam:   BP 140/64   Pulse 66   Wt 54.6 kg (120 lb 6.4 oz)   SpO2 97%   BMI 19.43 kg/m  General: NAD Neck: No JVD, no thyromegaly or thyroid nodule.  Lungs: Decreased BS left base. CV: Nondisplaced PMI.  Heart regular S1/S2, no S3/S4, no murmur.  No peripheral edema.  No carotid bruit.  Normal pedal pulses.  Abdomen: Soft, nontender, no hepatosplenomegaly, no distention.  Skin: Intact without lesions or rashes.  Neurologic: Alert and oriented x 3.  Psych: Normal affect. Extremities: No clubbing or cyanosis.  HEENT: Normal.    Recent Labs: 01/04/2019: ALT 26 10/11/2019: BUN 22;  Creatinine, Ser 1.42; Hemoglobin 14.7;  Magnesium 2.4; Platelets 159; Potassium 4.9; Sodium 137  Personally reviewed   Wt Readings from Last 3 Encounters:  10/11/19 54.6 kg (120 lb 6.4 oz)  10/03/19 52.6 kg (116 lb)  09/13/19 55.7 kg (122 lb 12.8 oz)      ASSESSMENT AND PLAN:  1. Atrial fibrillation: He has been on Tikosyn and is s/p atrial fibrillation ablation at [facility] on 06/25/14.  He has also had atypical atrial flutter. He had a redo atrial fibrillation ablation in 6/20.  Complex flutter was noted from several foci and was not ablated.  Zio patch from 7/20 showed predominantly NSR with short runs SVT.  Recently, he has been mostly in NSR.  ECG today shows NSR with low voltage and diseased-appearing P waves.  - Continue Tikosyn.  QTc today is normal.  I will check BMET and Mg.  - Continue Xarelto, check CBC.  - Continue nebivolol.   2. CAD: H/o CABG.  He had NSTEMI in 3/20 with DES to ostial/proximal LAD and SVG-RCA.  No further chest pain.  - He has stopped ASA.   - Continue Plavix ideally 1 year.  - He will continue Xarelto 15 mg daily long-term for atrial fibrillation/flutter.  - he is on statin and Zetia.  3. Hyperlipidemia: He is taking Crestor 40 mg daily and Zetia 10 mg daily. Good lipids in 3/20.     4. Ischemic cardiomyopathy/primarily diastolic CHF: Echo in 0000000 with EF 45-50%.  NYHA class II symptoms.  He is not volume overloaded on exam. - Continue Lasix 20 mg daily, BMET today.  - He is off losartan with elevated creatinine.  - Continue nebivolol.  5. Pulmonary: PFTs suggested both a restrictive and obstructive defect.  The restrictive defect is likely from post-polio syndrome and elevated left hemidiaphragm.  Mr Urrego never smoked much, so the obstructive defect is more surprising.  6. OSA: Severe, likely potentiates his atrial fibrillation. He has had a hard time tolerating CPAP.   - Now using an oral appliance.    Followup with me in 4 months.   Signed, [name],  MD  10/11/2019  [facility] [address] [facility] and [place] [address] [phone] (office) [phone] (fax)

## 2019-10-11 NOTE — Patient Instructions (Signed)
Labs today We will only contact you if something comes back abnormal or we need to make some changes. Otherwise no news is good news!  HOLD Xarelto 2 days before and day of dental procedure.  Your physician recommends that you schedule a follow-up appointment in: 4 months with Dr Aundra Dubin. Our office will call you to schedule this appointment.   At the [facility], you and your health needs are our priority. As part of our continuing mission to provide you with exceptional heart care, we have created designated Provider Care Teams. These Care Teams include your primary Cardiologist (physician) and Advanced Practice Providers (APPs- Physician Assistants and Nurse Practitioners) who all work together to provide you with the care you need, when you need it.   You may see any of the following providers on your designated Care Team at your next follow up: [name] Dr Glori Bickers . Dr [name] . Darrick Grinder, NP . Lyda Jester, PA   Please be sure to bring in all your medications bottles to every appointment.

## 2019-10-11 NOTE — Progress Notes (Signed)
Cardiac Individual Treatment Plan  Patient Details  Name: [name] MRN: [id number] Date of Birth: 1936-11-30 Referring Provider:     [place] from 08/28/2019 in [place]  Referring Provider  [name]       Initial Encounter Date:    CARDIAC REHAB PHASE II ORIENTATION from 08/28/2019 in [facility]  Date  08/28/19      Visit Diagnosis: NSTEMI (non-ST elevated myocardial infarction) ([place]) 03/25/19  Status post coronary artery stent placement 03/26/19 S/P DES LAD  Patient's Home Medications on Admission:  Current Outpatient Medications:  .  acetaminophen (TYLENOL) 500 MG tablet, Take 500 mg by mouth every 8 (eight) hours as needed for mild pain or moderate pain. , Disp: , Rfl:  .  albuterol (PROAIR HFA) 108 (90 BASE) MCG/ACT inhaler, Inhale 2 puffs into the lungs every 4 (four) hours as needed for wheezing or shortness of breath (and prior to exercise)., Disp: 1 Inhaler, Rfl: 3 .  AMBULATORY NON FORMULARY MEDICATION, Apply 1 application topically See admin instructions. Testosterone cream 155m/15% Place 1 ml  onto the skin daily, Disp: , Rfl:  .  clopidogrel (PLAVIX) 75 MG tablet, TAKE 1 TABLET (75 MG TOTAL) BY MOUTH DAILY WITH BREAKFAST., Disp: 90 tablet, Rfl: 2 .  dicyclomine (BENTYL) 10 MG capsule, Take 10 mg by mouth 3 (three) times daily as needed for spasms., Disp: , Rfl:  .  dofetilide (TIKOSYN) 250 MCG capsule, TAKE 1 CAPSULE BY MOUTH EVERY 12 HOURS *NEED OFFICE VISIT*, Disp: 180 capsule, Rfl: 3 .  ezetimibe (ZETIA) 10 MG tablet, TAKE 1 TABLET BY MOUTH EVERY DAY, Disp: 90 tablet, Rfl: 3 .  famotidine (PEPCID) 40 MG tablet, Take 1 tablet (40 mg total) by mouth at bedtime., Disp: 30 tablet, Rfl: 11 .  furosemide (LASIX) 40 MG tablet, Take 0.5 tablets (20 mg total) by mouth daily., Disp: 15 tablet, Rfl: 6 .  lactobacillus acidophilus & bulgar (LACTINEX) chewable tablet, Chew 2 tablets by  mouth daily. (0800), Disp: , Rfl:  .  loperamide (IMODIUM) 2 MG capsule, Take 2 mg by mouth 2 (two) times daily as needed for diarrhea or loose stools. One or two tablets daily as needed, Disp: , Rfl:  .  LORazepam (ATIVAN) 0.5 MG tablet, Take 0.5 mg by mouth at bedtime. , Disp: , Rfl:  .  magnesium oxide (MAG-OX) 400 MG tablet, Take 400 mg by mouth daily., Disp: , Rfl:  .  methocarbamol (ROBAXIN) 500 MG tablet, Take 500 mg by mouth 2 (two) times daily. , Disp: , Rfl:  .  nebivolol (BYSTOLIC) 10 MG tablet, Take 0.5 tablets (5 mg total) by mouth daily., Disp: 90 tablet, Rfl: 1 .  nitroGLYCERIN (NITROSTAT) 0.4 MG SL tablet, Place 1 tablet (0.4 mg total) under the tongue every 5 (five) minutes as needed for chest pain., Disp: 25 tablet, Rfl: 0 .  potassium chloride SA (K-DUR) 20 MEQ tablet, Take 1 tablet (20 mEq total) by mouth at bedtime., Disp: 90 tablet, Rfl: 3 .  Probiotic Product (ALIGN) 4 MG CAPS, Take 4 mg by mouth daily., Disp: , Rfl:  .  rosuvastatin (CRESTOR) 40 MG tablet, TAKE 1 TABLET BY MOUTH EVERY DAY, Disp: 90 tablet, Rfl: 3 .  sodium chloride (OCEAN) 0.65 % SOLN nasal spray, Place 1 spray into both nostrils as needed for congestion., Disp: , Rfl:  .  traZODone (DESYREL) 50 MG tablet, Take 50 mg by mouth at bedtime.,  Disp: , Rfl:  .  Vitamin D, Ergocalciferol, (DRISDOL) 50000 UNITS CAPS capsule, Take 50,000 Units by mouth every 14 (fourteen) days. Take on 1st and 15th, Disp: , Rfl:  .  XARELTO 15 MG TABS tablet, TAKE 1 TABLET BY MOUTH EVERY DAY IN THE EVENING, Disp: 90 tablet, Rfl: 1  Past Medical History: Past Medical History:  Diagnosis Date  . Abnormal nuclear cardiac imaging test March 2011   Has positive EKG response, no perfusion defect and normal EF  . Adenomatous colon polyp 12/2002  . Arrhythmia   . Atrial fibrillation ([place])    a. s/p rfca;  b. chronic tikosyn and xarelto.  . 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  Cataract    bil cataracts removed  . Chronic diastolic CHF (congestive heart failure) ([place])    a. 04/2017 Echo: EF 65-70%, restrictive physiology, mildly dil LA.  [name] Complication of anesthesia   . COPD (chronic obstructive pulmonary disease) ([place])   . Coronary artery disease    First MI in 1988. PCI in 1996 with subsequent CABG in 1996 due to restenosis. S/P stents to LCX in 2009. Noted to have residual disease in the LAD in a diffuse manner and atretic LIMA graft. He is managed medically.   . Difficult intubation    06/08/19 update - Intubated with MAC3 with grade IIb view 7.0 tube passed easily  . Dysrhythmia    Afib  . Esophageal stricture   . GERD (gastroesophageal reflux disease)   . 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 History of Raynaud's syndrome   . Hyperlipidemia   . Hypertension   . Internal hemorrhoids   . Muscle spasm   . Myocardial infarction [facility])    MI x1 59 - 68 age  . Neuromuscular disorder ([place])    Polio - age 65  . OSA (obstructive sleep apnea) 10/16/2018   Severe OSA with AHI 37.2/hr on BiPAP  . Persistent atrial fibrillation ([place])   . Polio    age 61  . PVC (premature ventricular contraction)   . Scoliosis    mild    Tobacco Use: Social History   Tobacco Use  Smoking Status Former Smoker  . Packs/day: 0.50  . Years: 5.00  . Pack years: 2.50  . Types: Cigarettes  . Quit date: 12/27/1966  . Years since quitting: 52.8  Smokeless Tobacco Former Systems developer  . Quit date: 1950    Labs: Recent Review Flowsheet Data    Labs for ITP Cardiac and Pulmonary Rehab Latest Ref Rng & Units 07/19/2014 12/08/2017 12/07/2018 03/26/2019 03/27/2019   Cholestrol 0 - 200 mg/dL 162 148 124 - 111   LDLCALC 0 - 99 mg/dL 96 73 62 - 53   HDL >40 mg/dL 48.20 52 53 - 47   Trlycerides <150 mg/dL 88.0 116 47 - 53   Hemoglobin A1c 4.8 - 5.6 % - - - 6.2(H) -      Capillary Blood Glucose: No results found for: GLUCAP   Exercise Target Goals: Exercise Program  Goal: Individual exercise prescription set using results from initial 6 min walk test and THRR while considering  patient's activity barriers and safety.   Exercise Prescription Goal: Starting with aerobic activity 30 plus minutes a day, 3 days per week for initial exercise prescription. Provide home exercise prescription and guidelines that participant acknowledges understanding prior to discharge.  Activity Barriers & Risk Stratification: Activity Barriers & Cardiac Risk  Stratification - 09/05/19 1153      Activity Barriers & Cardiac Risk Stratification   Activity Barriers  Deconditioning;Other (comment)    Comments  Patient has right arm pain from weakness in right arm from Polio in the past.    Cardiac Risk Stratification  High       6 Minute Walk: 6 Minute Walk    Row Name 08/28/19 1029 10/08/19 1051       6 Minute Walk   Phase  Initial  Discharge    Distance  1200 feet  1200 feet    Distance % Change  -  0 %    Distance Feet Change  -  0 ft    Walk Time  6 minutes  6 minutes    # of Rest Breaks  0  0    MPH  2.2  2.27    METS  2.3  2.29    RPE  11  12    Perceived Dyspnea   0  0    VO2 Peak  8.11  8.03    Symptoms  No  No    Resting HR  64 bpm  61 bpm    Resting BP  142/72  130/64    Resting Oxygen Saturation   96 %  -    Exercise Oxygen Saturation  during 6 min walk  96 %  -    Max Ex. HR  76 bpm  73 bpm    Max Ex. BP  128/66  128/62    2 Minute Post BP  118/60  104/64       Oxygen Initial Assessment:   Oxygen Re-Evaluation:   Oxygen Discharge (Final Oxygen Re-Evaluation):   Initial Exercise Prescription: Initial Exercise Prescription - 08/28/19 1000      Date of Initial Exercise RX and Referring Provider   Date  08/28/19    Referring Provider  [name]     Expected Discharge Date  10/12/19      NuStep   Level  2    SPM  75    Minutes  15    METs  2      Arm Ergometer   Level  1.5    Watts  15    Minutes  15    METs  2.4       Prescription Details   Frequency (times per week)  3    Duration  Progress to 30 minutes of continuous aerobic without signs/symptoms of physical distress      Intensity   THRR 40-80% of Max Heartrate  56-111    Ratings of Perceived Exertion  11-13      Progression   Progression  Continue to progress workloads to maintain intensity without signs/symptoms of physical distress.      Resistance Training   Training Prescription  Yes    Weight  4 lbs.     Reps  10-15       Perform Capillary Blood Glucose checks as needed.  Exercise Prescription Changes: Exercise Prescription Changes    Row Name 09/05/19 1057 09/10/19 1048 09/26/19 1048 10/08/19 1051       Response to Exercise   Blood Pressure (Admit)  115/60  139/70  124/56  130/64    Blood Pressure (Exercise)  120/70  122/60  128/60  128/62    Blood Pressure (Exit)  120/70  106/62  130/62  104/64    Heart Rate (Admit)  92 bpm  64 bpm  60 bpm  61 bpm    Heart Rate (Exercise)  117 bpm  70 bpm  67 bpm  75 bpm    Heart Rate (Exit)  93 bpm  64 bpm  58 bpm  60 bpm    Rating of Perceived Exertion (Exercise)  _0 Symptoms  none  none  none  none    Comments  Patient had some right arm pain on arm ergometer, switched to lighter arm ergometer.  -  -  -    Duration  Progress to 30 minutes of  aerobic without signs/symptoms of physical distress  Progress to 30 minutes of  aerobic without signs/symptoms of physical distress  Progress to 30 minutes of  aerobic without signs/symptoms of physical distress  Progress to 30 minutes of  aerobic without signs/symptoms of physical distress    Intensity  THRR unchanged  THRR unchanged  THRR unchanged  THRR unchanged      Progression   Progression  Continue to progress workloads to maintain intensity without signs/symptoms of physical distress.  Continue to progress workloads to maintain intensity without signs/symptoms of physical distress.  Continue to progress workloads to maintain  intensity without signs/symptoms of physical distress.  Continue to progress workloads to maintain intensity without signs/symptoms of physical distress.    Average METs  2.2  -  1.6  2.2      Resistance Training   Training Prescription  No Relaxation day, no weights.  Yes  No Relaxation day, no weights.  No Post measurements, education test, and QOl survey.    Weight  -  2lbs  -  -    Reps  -  10-15  -  -    Time  -  10 Minutes  -  -      Interval Training   Interval Training  No  No  No  No      NuStep   Level  _1 SPM  71  -  -  -    Minutes  _2 METs  2.2  - Didn't report MET avg  1.6  1.7      Arm Ergometer   Level  1  -  -  -    Watts  15  -  -  -    Minutes  15  -  -  -    METs  2.3  -  -  -      Track   Laps  -  -  -  6 1200 ft    Minutes  -  -  -  6 Walk test    METs  -  -  -  2.74      Home Exercise Plan   Plans to continue exercise at  -  Home (comment) Walking  Home (comment) Walking  Home (comment) Walking    Frequency  -  Add 1 additional day to program exercise sessions.  Add 1 additional day to program exercise sessions.  Add 1 additional day to program exercise sessions.    Initial Home Exercises Provided  -  09/10/19  09/10/19  09/10/19       Exercise Comments: Exercise Comments    Row Name 09/05/19 1153 09/10/19 1057 09/26/19 1100 10/08/19 1137     Exercise Comments  Patient tolerated 1st session of exercise  fair with some right arm pain on the arm ergometer.  Reviewed home exercise guidelines, METs, and goals with patient.  Reviewed METs with patient.  Reviewed goals with patient.       Exercise Goals and Review: Exercise Goals    Row Name 08/28/19 1032             Exercise Goals   Increase Physical Activity  Yes       Intervention  Provide advice, education, support and counseling about physical activity/exercise needs.;Develop an individualized exercise prescription for aerobic and resistive training based on initial  evaluation findings, risk stratification, comorbidities and participant's personal goals.       Expected Outcomes  Short Term: Attend rehab on a regular basis to increase amount of physical activity.;Long Term: Add in home exercise to make exercise part of routine and to increase amount of physical activity.;Long Term: Exercising regularly at least 3-5 days a week.       Increase Strength and Stamina  Yes       Intervention  Provide advice, education, support and counseling about physical activity/exercise needs.;Develop an individualized exercise prescription for aerobic and resistive training based on initial evaluation findings, risk stratification, comorbidities and participant's personal goals.       Expected Outcomes  Short Term: Increase workloads from initial exercise prescription for resistance, speed, and METs.;Short Term: Perform resistance training exercises routinely during rehab and add in resistance training at home;Long Term: Improve cardiorespiratory fitness, muscular endurance and strength as measured by increased METs and functional capacity (6MWT)       Able to understand and use rate of perceived exertion (RPE) scale  Yes       Intervention  Provide education and explanation on how to use RPE scale       Expected Outcomes  Short Term: Able to use RPE daily in rehab to express subjective intensity level;Long Term:  Able to use RPE to guide intensity level when exercising independently       Knowledge and understanding of Target Heart Rate Range (THRR)  Yes       Intervention  Provide education and explanation of THRR including how the numbers were predicted and where they are located for reference       Expected Outcomes  Short Term: Able to state/look up THRR;Long Term: Able to use THRR to govern intensity when exercising independently;Short Term: Able to use daily as guideline for intensity in rehab       Able to check pulse independently  Yes       Intervention  Provide education  and demonstration on how to check pulse in carotid and radial arteries.;Review the importance of being able to check your own pulse for safety during independent exercise       Expected Outcomes  Short Term: Able to explain why pulse checking is important during independent exercise;Long Term: Able to check pulse independently and accurately       Understanding of Exercise Prescription  Yes       Intervention  Provide education, explanation, and written materials on patient's individual exercise prescription       Expected Outcomes  Short Term: Able to explain program exercise prescription;Long Term: Able to explain home exercise prescription to exercise independently          Exercise Goals Re-Evaluation : Exercise Goals Re-Evaluation    [place] Name 09/05/19 1153 09/10/19 1057 10/08/19 1137         Exercise Goal Re-Evaluation   Exercise  Goals Review  Increase Physical Activity;Able to understand and use rate of perceived exertion (RPE) scale  Increase Physical Activity;Able to understand and use rate of perceived exertion (RPE) scale;Able to check pulse independently;Knowledge and understanding of Target Heart Rate Range (THRR);Understanding of Exercise Prescription  Increase Physical Activity;Able to understand and use rate of perceived exertion (RPE) scale;Able to check pulse independently;Knowledge and understanding of Target Heart Rate Range (THRR);Understanding of Exercise Prescription;Increase Strength and Stamina     Comments  Patient able to understand and use RPE scale appropriately. Pt had some right arm pain on arm ergometer from weakness in that arm as a result of hx of Polio. Switched to lighter arm ergometer.  Switched patient from arm ergometer to recumbent stepper for 30 minutes because chronic right arm pain. Reviewed home exercise guidelines with patient including THRR, RPE scale, and endpoints for exercise. Pt is able to check pulse independently.  Patient will complete the cardiac  rehab program soon and plans to continue current exercise routine, walking ~1 mile, 3 days/week. Pt also has a treadmill at home that he can use for exercise. Pt has 4lb weights at home.     Expected Outcomes  Progress workloads as tolerated to help improve cardiorespiratory fitness.  Patient will add at least one day of walking at home in addition to exercise at cardiac rehab.  Patient will walk at least 3 days/week to help improve strength and stamina.         Discharge Exercise Prescription (Final Exercise Prescription Changes): Exercise Prescription Changes - 10/08/19 1051      Response to Exercise   Blood Pressure (Admit)  130/64    Blood Pressure (Exercise)  128/62    Blood Pressure (Exit)  104/64    Heart Rate (Admit)  61 bpm    Heart Rate (Exercise)  75 bpm    Heart Rate (Exit)  60 bpm    Rating of Perceived Exertion (Exercise)  12    Symptoms  none    Duration  Progress to 30 minutes of  aerobic without signs/symptoms of physical distress    Intensity  THRR unchanged      Progression   Progression  Continue to progress workloads to maintain intensity without signs/symptoms of physical distress.    Average METs  2.2      Resistance Training   Training Prescription  No   Post measurements, education test, and QOl survey.     Interval Training   Interval Training  No      NuStep   Level  2    Minutes  20    METs  1.7      Track   Laps  6   1200 ft   Minutes  6   Walk test   METs  2.74      Home Exercise Plan   Plans to continue exercise at  Home (comment)   Walking   Frequency  Add 1 additional day to program exercise sessions.    Initial Home Exercises Provided  09/10/19       Nutrition:  Target Goals: Understanding of nutrition guidelines, daily intake of sodium <1569m, cholesterol <2054m calories 30% from fat and 7% or less from saturated fats, daily to have 5 or more servings of fruits and vegetables.  Biometrics: Pre Biometrics - 08/28/19 1032       Pre Biometrics   Height  5' 6.25" (1.683 m)    Weight  55.1 kg    Waist Circumference  29.5 inches    Hip Circumference  34 inches    Waist to Hip Ratio  0.87 %    BMI (Calculated)  19.45    Triceps Skinfold  21 mm    % Body Fat  21.6 %    Grip Strength  22 kg    Flexibility  0 in    Single Leg Stand  7.43 seconds      Post Biometrics - 10/08/19 1100       Post  Biometrics   Waist Circumference  30.5 inches    Hip Circumference  34.5 inches    Waist to Hip Ratio  0.88 %    Triceps Skinfold  13 mm    Grip Strength  25 kg    Flexibility  0 in    Single Leg Stand  4.31 seconds       Nutrition Therapy Plan and Nutrition Goals:   Nutrition Assessments:   Nutrition Goals Re-Evaluation:   Nutrition Goals Discharge (Final Nutrition Goals Re-Evaluation):   Psychosocial: Target Goals: Acknowledge presence or absence of significant depression and/or stress, maximize coping skills, provide positive support system. Participant is able to verbalize types and ability to use techniques and skills needed for reducing stress and depression.  Initial Review & Psychosocial Screening: Initial Psych Review & Screening - 08/28/19 1127      Initial Review   Current issues with  None Identified      Family Dynamics   Good Support System?  Yes   Patient has his wife for support     Barriers   Psychosocial barriers to participate in program  There are no identifiable barriers or psychosocial needs.      Screening Interventions   Interventions  Encouraged to exercise       Quality of Life Scores: Quality of Life - 10/09/19 1041      Quality of Life   Select  Quality of Life      Quality of Life Scores   Health/Function Pre  23.4 %    Health/Function Post  22.8 %    Health/Function % Change  -2.56 %    Socioeconomic Pre  27.21 %    Socioeconomic Post  27.71 %    Socioeconomic % Change   1.84 %    Psych/Spiritual Pre  29 %    Psych/Spiritual Post  26.64 %     Psych/Spiritual % Change  -8.14 %    Family Pre  28.8 %    Family Post  28.8 %    Family % Change  0 %    GLOBAL Pre  26.05 %    GLOBAL Post  25.49 %    GLOBAL % Change  -2.15 %      Scores of 19 and below usually indicate a poorer quality of life in these areas.  A difference of  2-3 points is a clinically meaningful difference.  A difference of 2-3 points in the total score of the Quality of Life Index has been associated with significant improvement in overall quality of life, self-image, physical symptoms, and general health in studies assessing change in quality of life.  PHQ-9: Recent Review Flowsheet Data    Depression screen [facility] 2/9 08/28/2019   Decreased Interest 0   Down, Depressed, Hopeless 0   PHQ - 2 Score 0     Interpretation of Total Score  Total Score Depression Severity:  1-4 = Minimal depression, 5-9 = Mild depression, 10-14 = Moderate depression, 15-19 =  Moderately severe depression, 20-27 = Severe depression   Psychosocial Evaluation and Intervention: Psychosocial Evaluation - 09/10/19 1405      Psychosocial Evaluation & Interventions   Interventions  Encouraged to exercise with the program and follow exercise prescription    Comments  No psychosocial interventions necessary.  Ed enjoys working and riding his motorcycle.    Expected Outcomes  Ed will maintain a positive outlook.    Continue Psychosocial Services   No Follow up required       Psychosocial Re-Evaluation: Psychosocial Re-Evaluation    [place] Name 09/10/19 1405 10/05/19 1530           Psychosocial Re-Evaluation   Current issues with  None Identified  None Identified      Comments  Ed does not require any pschosocial interventions.  Ed does not require any pschosocial interventions.      Expected Outcomes  Ed will maintain a positive outlook.  Ed will maintain a positive outlook.      Interventions  Encouraged to attend Cardiac Rehabilitation for the exercise  Encouraged to attend Cardiac  Rehabilitation for the exercise      Continue Psychosocial Services   No Follow up required  No Follow up required         Psychosocial Discharge (Final Psychosocial Re-Evaluation): Psychosocial Re-Evaluation - 10/05/19 1530      Psychosocial Re-Evaluation   Current issues with  None Identified    Comments  Ed does not require any pschosocial interventions.    Expected Outcomes  Ed will maintain a positive outlook.    Interventions  Encouraged to attend Cardiac Rehabilitation for the exercise    Continue Psychosocial Services   No Follow up required       Vocational Rehabilitation: Provide vocational rehab assistance to qualifying candidates.   Vocational Rehab Evaluation & Intervention: Vocational Rehab - 08/28/19 1129      Initial Vocational Rehab Evaluation & Intervention   Assessment shows need for Vocational Rehabilitation  No   Ed is currently working and does not need vocational rehab at this time      Education: Education Goals: Education classes will be provided on a weekly basis, covering required topics. Participant will state understanding/return demonstration of topics presented.  Learning Barriers/Preferences: Learning Barriers/Preferences - 08/28/19 1034      Learning Barriers/Preferences   Learning Barriers  None    Learning Preferences  Individual Instruction;Pictoral;Video;Written Material       Education Topics: Hypertension, Hypertension Reduction -Define heart disease and high blood pressure. Discus how high blood pressure affects the body and ways to reduce high blood pressure.   Exercise and Your Heart -Discuss why it is important to exercise, the FITT principles of exercise, normal and abnormal responses to exercise, and how to exercise safely.   Angina -Discuss definition of angina, causes of angina, treatment of angina, and how to decrease risk of having angina.   Cardiac Medications -Review what the following cardiac medications are  used for, how they affect the body, and side effects that may occur when taking the medications.  Medications include Aspirin, Beta blockers, calcium channel blockers, ACE Inhibitors, angiotensin receptor blockers, diuretics, digoxin, and antihyperlipidemics.   Congestive Heart Failure -Discuss the definition of CHF, how to live with CHF, the signs and symptoms of CHF, and how keep track of weight and sodium intake.   Heart Disease and Intimacy -Discus the effect sexual activity has on the heart, how changes occur during intimacy as we age, and safety during  sexual activity.   Smoking Cessation / COPD -Discuss different methods to quit smoking, the health benefits of quitting smoking, and the definition of COPD.   Nutrition I: Fats -Discuss the types of cholesterol, what cholesterol does to the heart, and how cholesterol levels can be controlled.   Nutrition II: Labels -Discuss the different components of food labels and how to read food label   Heart Parts/Heart Disease and PAD -Discuss the anatomy of the heart, the pathway of blood circulation through the heart, and these are affected by heart disease.   Stress I: Signs and Symptoms -Discuss the causes of stress, how stress may lead to anxiety and depression, and ways to limit stress.   Stress II: Relaxation -Discuss different types of relaxation techniques to limit stress.   Warning Signs of Stroke / TIA -Discuss definition of a stroke, what the signs and symptoms are of a stroke, and how to identify when someone is having stroke.   Knowledge Questionnaire Score: Knowledge Questionnaire Score - 10/09/19 1042      Knowledge Questionnaire Score   Pre Score  22/24    Post Score  21/24       Core Components/Risk Factors/Patient Goals at Admission: Personal Goals and Risk Factors at Admission - 08/28/19 1130      Core Components/Risk Factors/Patient Goals on Admission    Weight Management  Weight Maintenance;Yes     Intervention  Weight Management: Develop a combined nutrition and exercise program designed to reach desired caloric intake, while maintaining appropriate intake of nutrient and fiber, sodium and fats, and appropriate energy expenditure required for the weight goal.    Expected Outcomes  Short Term: Continue to assess and modify interventions until short term weight is achieved;Long Term: Adherence to nutrition and physical activity/exercise program aimed toward attainment of established weight goal;Weight Maintenance: Understanding of the daily nutrition guidelines, which includes 25-35% calories from fat, 7% or less cal from saturated fats, less than 259m cholesterol, less than 1.5gm of sodium, & 5 or more servings of fruits and vegetables daily;Understanding recommendations for meals to include 15-35% energy as protein, 25-35% energy from fat, 35-60% energy from carbohydrates, less than 2057mof dietary cholesterol, 20-35 gm of total fiber daily;Understanding of distribution of calorie intake throughout the day with the consumption of 4-5 meals/snacks    Hypertension  Yes    Intervention  Provide education on lifestyle modifcations including regular physical activity/exercise, weight management, moderate sodium restriction and increased consumption of fresh fruit, vegetables, and low fat dairy, alcohol moderation, and smoking cessation.;Monitor prescription use compliance.    Expected Outcomes  Short Term: Continued assessment and intervention until BP is < 140/9045mG in hypertensive participants. < 130/65m75m in hypertensive participants with diabetes, heart failure or chronic kidney disease.;Long Term: Maintenance of blood pressure at goal levels.    Lipids  Yes    Intervention  Provide education and support for participant on nutrition & aerobic/resistive exercise along with prescribed medications to achieve LDL <70mg67mL >40mg.22mExpected Outcomes  Short Term: Participant states understanding of  desired cholesterol values and is compliant with medications prescribed. Participant is following exercise prescription and nutrition guidelines.;Long Term: Cholesterol controlled with medications as prescribed, with individualized exercise RX and with personalized nutrition plan. Value goals: LDL < 70mg, 68m> 40 mg.    Stress  Yes    Intervention  Offer individual and/or small group education and counseling on adjustment to heart disease, stress management and health-related lifestyle change. Teach  and support self-help strategies.;Refer participants experiencing significant psychosocial distress to appropriate mental health specialists for further evaluation and treatment. When possible, include family members and significant others in education/counseling sessions.    Expected Outcomes  Short Term: Participant demonstrates changes in health-related behavior, relaxation and other stress management skills, ability to obtain effective social support, and compliance with psychotropic medications if prescribed.;Long Term: Emotional wellbeing is indicated by absence of clinically significant psychosocial distress or social isolation.       Core Components/Risk Factors/Patient Goals Review:  Goals and Risk Factor Review    Row Name 09/10/19 1406 10/05/19 1529           Core Components/Risk Factors/Patient Goals Review   Personal Goals Review  Weight Management/Obesity;Hypertension;Lipids;Stress  Weight Management/Obesity;Hypertension;Lipids;Stress      Review  Pt with multiple CAD RFs eager to participate in CR exercise. Ed would like to increase his stamina.  Pt with multiple CAD RFs eager to participate in CR exercise.  Ed continues to participate in CR with intermittent absences.  He is not making progress with his METs and lacks motivation/effort.      Expected Outcomes  Ed will continue to participate in CR exercise, nutrition, and lifestyle modification opportunities.  Ed will continue to  participate in CR exercise, nutrition, and lifestyle modification opportunities.         Core Components/Risk Factors/Patient Goals at Discharge (Final Review):  Goals and Risk Factor Review - 10/05/19 1529      Core Components/Risk Factors/Patient Goals Review   Personal Goals Review  Weight Management/Obesity;Hypertension;Lipids;Stress    Review  Pt with multiple CAD RFs eager to participate in CR exercise.  Ed continues to participate in CR with intermittent absences.  He is not making progress with his METs and lacks motivation/effort.    Expected Outcomes  Ed will continue to participate in CR exercise, nutrition, and lifestyle modification opportunities.       ITP Comments: ITP Comments    Row Name 08/28/19 1118 09/10/19 1359 10/05/19 1523       ITP Comments  Dr Fransico Him MD, Medical Director  30 Day ITP Review.  Ed is tolerating starting exercise.  His vital signs remain stable.  His heart rhythm varies between SR and AF.  This is not new for Ed.  30 Day ITP Review. Ed continues to participate in CR with intermittent absences.  He is not making progress with his METs and lacks motivation/effort.  He graduates from [employer] on 10/12/2019.        Comments: See ITP comments. Patient graduates tomorrow.Barnet Pall, RN,BSN 10/11/2019 2:05 PM

## 2019-10-12 ENCOUNTER — Encounter (HOSPITAL_COMMUNITY): Payer: Medicare Other

## 2019-10-17 ENCOUNTER — Other Ambulatory Visit: Payer: Self-pay

## 2019-10-17 ENCOUNTER — Encounter (HOSPITAL_COMMUNITY): Payer: Self-pay | Admitting: Emergency Medicine

## 2019-10-17 ENCOUNTER — Emergency Department (HOSPITAL_COMMUNITY)
Admission: EM | Admit: 2019-10-17 | Discharge: 2019-10-18 | Discharge status: Against Medical Advice | Payer: Medicare Other | Source: Home / Self Care

## 2019-10-17 DIAGNOSIS — I639 Cerebral infarction, unspecified: Secondary | ICD-10-CM | POA: Diagnosis not present

## 2019-10-17 DIAGNOSIS — I4821 Permanent atrial fibrillation: Secondary | ICD-10-CM | POA: Diagnosis not present

## 2019-10-17 DIAGNOSIS — Z20828 Contact with and (suspected) exposure to other viral communicable diseases: Secondary | ICD-10-CM | POA: Diagnosis not present

## 2019-10-17 DIAGNOSIS — I130 Hypertensive heart and chronic kidney disease with heart failure and stage 1 through stage 4 chronic kidney disease, or unspecified chronic kidney disease: Secondary | ICD-10-CM | POA: Diagnosis not present

## 2019-10-17 DIAGNOSIS — I10 Essential (primary) hypertension: Secondary | ICD-10-CM | POA: Diagnosis not present

## 2019-10-17 DIAGNOSIS — I5032 Chronic diastolic (congestive) heart failure: Secondary | ICD-10-CM | POA: Diagnosis not present

## 2019-10-17 DIAGNOSIS — Z5321 Procedure and treatment not carried out due to patient leaving prior to being seen by health care provider: Secondary | ICD-10-CM | POA: Insufficient documentation

## 2019-10-17 DIAGNOSIS — K0889 Other specified disorders of teeth and supporting structures: Secondary | ICD-10-CM | POA: Diagnosis not present

## 2019-10-17 DIAGNOSIS — G8191 Hemiplegia, unspecified affecting right dominant side: Secondary | ICD-10-CM | POA: Diagnosis not present

## 2019-10-17 NOTE — Progress Notes (Signed)
Discharge Progress Report  Patient Details  Name: [name] MRN: [id number] Date of Birth: 02-26-1936 Referring Provider:     [place] from 08/28/2019 in [place]  Referring Provider  [name]        Number of Visits: 10  Reason for Discharge:  Patient independent in their exercise.  Smoking History:  Social History   Tobacco Use  Smoking Status Former Smoker  . Packs/day: 0.50  . Years: 5.00  . Pack years: 2.50  . Types: Cigarettes  . Quit date: 12/27/1966  . Years since quitting: 52.8  Smokeless Tobacco Former Systems developer  . Quit date: 1950    Diagnosis:  NSTEMI (non-ST elevated myocardial infarction) ([place]) 03/25/19  Status post coronary artery stent placement 03/26/19 S/P DES LAD  ADL [facility]:   Initial Exercise Prescription: Initial Exercise Prescription - 08/28/19 1000      Date of Initial Exercise RX and Referring Provider   Date  08/28/19    Referring Provider  [name]     Expected Discharge Date  10/12/19      NuStep   Level  2    SPM  75    Minutes  15    METs  2      Arm Ergometer   Level  1.5    Watts  15    Minutes  15    METs  2.4      Prescription Details   Frequency (times per week)  3    Duration  Progress to 30 minutes of continuous aerobic without signs/symptoms of physical distress      Intensity   THRR 40-80% of Max Heartrate  56-111    Ratings of Perceived Exertion  11-13      Progression   Progression  Continue to progress workloads to maintain intensity without signs/symptoms of physical distress.      Resistance Training   Training Prescription  Yes    Weight  4 lbs.     Reps  10-15       Discharge Exercise Prescription (Final Exercise Prescription Changes): Exercise Prescription Changes - 10/08/19 1051      Response to Exercise   Blood Pressure (Admit)  130/64    Blood Pressure (Exercise)  128/62    Blood Pressure (Exit)  104/64    Heart Rate (Admit)  61  bpm    Heart Rate (Exercise)  75 bpm    Heart Rate (Exit)  60 bpm    Rating of Perceived Exertion (Exercise)  12    Symptoms  none    Duration  Progress to 30 minutes of  aerobic without signs/symptoms of physical distress    Intensity  THRR unchanged      Progression   Progression  Continue to progress workloads to maintain intensity without signs/symptoms of physical distress.    Average METs  2.2      Resistance Training   Training Prescription  No   Post measurements, education test, and QOl survey.     Interval Training   Interval Training  No      NuStep   Level  2    Minutes  20    METs  1.7      Track   Laps  6   1200 ft   Minutes  6   Walk test   METs  2.74      Home Exercise Plan   Plans to continue exercise  at  Home (comment)   Walking   Frequency  Add 1 additional day to program exercise sessions.    Initial Home Exercises Provided  09/10/19       Functional Capacity: 6 Minute Walk    Row Name 08/28/19 1029 10/08/19 1051       6 Minute Walk   Phase  Initial  Discharge    Distance  1200 feet  1200 feet    Distance % Change  -  0 %    Distance Feet Change  -  0 ft    Walk Time  6 minutes  6 minutes    # of Rest Breaks  0  0    MPH  2.2  2.27    METS  2.3  2.29    RPE  11  12    Perceived Dyspnea   0  0    VO2 Peak  8.11  8.03    Symptoms  No  No    Resting HR  64 bpm  61 bpm    Resting BP  142/72  130/64    Resting Oxygen Saturation   96 %  -    Exercise Oxygen Saturation  during 6 min walk  96 %  -    Max Ex. HR  76 bpm  73 bpm    Max Ex. BP  128/66  128/62    2 Minute Post BP  118/60  104/64       Psychological, QOL, Others - Outcomes: PHQ 2/9: Depression screen PHQ 2/9 08/28/2019  Decreased Interest 0  Down, Depressed, Hopeless 0  PHQ - 2 Score 0  Some recent data might be hidden    Quality of Life: Quality of Life - 10/09/19 1041      Quality of Life   Select  Quality of Life      Quality of Life Scores   Health/Function  Pre  23.4 %    Health/Function Post  22.8 %    Health/Function % Change  -2.56 %    Socioeconomic Pre  27.21 %    Socioeconomic Post  27.71 %    Socioeconomic % Change   1.84 %    Psych/Spiritual Pre  29 %    Psych/Spiritual Post  26.64 %    Psych/Spiritual % Change  -8.14 %    Family Pre  28.8 %    Family Post  28.8 %    Family % Change  0 %    GLOBAL Pre  26.05 %    GLOBAL Post  25.49 %    GLOBAL % Change  -2.15 %       Personal Goals: Goals established at orientation with interventions provided to work toward goal. Personal Goals and Risk Factors at Admission - 08/28/19 1130      Core Components/Risk Factors/Patient Goals on Admission    Weight Management  Weight Maintenance;Yes    Intervention  Weight Management: Develop a combined nutrition and exercise program designed to reach desired caloric intake, while maintaining appropriate intake of nutrient and fiber, sodium and fats, and appropriate energy expenditure required for the weight goal.    Expected Outcomes  Short Term: Continue to assess and modify interventions until short term weight is achieved;Long Term: Adherence to nutrition and physical activity/exercise program aimed toward attainment of established weight goal;Weight Maintenance: Understanding of the daily nutrition guidelines, which includes 25-35% calories from fat, 7% or less cal from saturated fats, less than 200mg  cholesterol, less  than 1.5gm of sodium, & 5 or more servings of fruits and vegetables daily;Understanding recommendations for meals to include 15-35% energy as protein, 25-35% energy from fat, 35-60% energy from carbohydrates, less than 200mg  of dietary cholesterol, 20-35 gm of total fiber daily;Understanding of distribution of calorie intake throughout the day with the consumption of 4-5 meals/snacks    Hypertension  Yes    Intervention  Provide education on lifestyle modifcations including regular physical activity/exercise, weight management, moderate  sodium restriction and increased consumption of fresh fruit, vegetables, and low fat dairy, alcohol moderation, and smoking cessation.;Monitor prescription use compliance.    Expected Outcomes  Short Term: Continued assessment and intervention until BP is < 140/31mm HG in hypertensive participants. < 130/64mm HG in hypertensive participants with diabetes, heart failure or chronic kidney disease.;Long Term: Maintenance of blood pressure at goal levels.    Lipids  Yes    Intervention  Provide education and support for participant on nutrition & aerobic/resistive exercise along with prescribed medications to achieve LDL 70mg , HDL >40mg .    Expected Outcomes  Short Term: Participant states understanding of desired cholesterol values and is compliant with medications prescribed. Participant is following exercise prescription and nutrition guidelines.;Long Term: Cholesterol controlled with medications as prescribed, with individualized exercise RX and with personalized nutrition plan. Value goals: LDL < 70mg , HDL > 40 mg.    Stress  Yes    Intervention  Offer individual and/or small group education and counseling on adjustment to heart disease, stress management and health-related lifestyle change. Teach and support self-help strategies.;Refer participants experiencing significant psychosocial distress to appropriate mental health specialists for further evaluation and treatment. When possible, include family members and significant others in education/counseling sessions.    Expected Outcomes  Short Term: Participant demonstrates changes in health-related behavior, relaxation and other stress management skills, ability to obtain effective social support, and compliance with psychotropic medications if prescribed.;Long Term: Emotional wellbeing is indicated by absence of clinically significant psychosocial distress or social isolation.        Personal Goals Discharge: Goals and Risk Factor Review    Row Name  09/10/19 1406 10/05/19 1529 10/17/19 1039         Core Components/Risk Factors/Patient Goals Review   Personal Goals Review  Weight Management/Obesity;Hypertension;Lipids;Stress  Weight Management/Obesity;Hypertension;Lipids;Stress  Weight Management/Obesity;Hypertension;Lipids;Stress     Review  Pt with multiple CAD RFs eager to participate in CR exercise. Ed would like to increase his stamina.  Pt with multiple CAD RFs eager to participate in CR exercise.  Ed continues to participate in CR with intermittent absences.  He is not making progress with his METs and lacks motivation/effort.  Ed has graduated with 10 complete sessions.  Ed had intermittent absences throughout the program.  He did not make expected progress with his METs and lacked motivation/effort.     Expected Outcomes  Ed will continue to participate in CR exercise, nutrition, and lifestyle modification opportunities.  Ed will continue to participate in CR exercise, nutrition, and lifestyle modification opportunities.  Ed will continue to participate in exercise, nutrition, and lifestyle modification opportunities.  He plans to walk three times a week.        Exercise Goals and Review: Exercise Goals    Row Name 08/28/19 1032             Exercise Goals   Increase Physical Activity  Yes       Intervention  Provide advice, education, support and counseling about physical activity/exercise needs.;Develop an individualized exercise prescription  for aerobic and resistive training based on initial evaluation findings, risk stratification, comorbidities and participant's personal goals.       Expected Outcomes  Short Term: Attend rehab on a regular basis to increase amount of physical activity.;Long Term: Add in home exercise to make exercise part of routine and to increase amount of physical activity.;Long Term: Exercising regularly at least 3-5 days a week.       Increase Strength and Stamina  Yes       Intervention  Provide  advice, education, support and counseling about physical activity/exercise needs.;Develop an individualized exercise prescription for aerobic and resistive training based on initial evaluation findings, risk stratification, comorbidities and participant's personal goals.       Expected Outcomes  Short Term: Increase workloads from initial exercise prescription for resistance, speed, and METs.;Short Term: Perform resistance training exercises routinely during rehab and add in resistance training at home;Long Term: Improve cardiorespiratory fitness, muscular endurance and strength as measured by increased METs and functional capacity (6MWT)       Able to understand and use rate of perceived exertion (RPE) scale  Yes       Intervention  Provide education and explanation on how to use RPE scale       Expected Outcomes  Short Term: Able to use RPE daily in rehab to express subjective intensity level;Long Term:  Able to use RPE to guide intensity level when exercising independently       Knowledge and understanding of Target Heart Rate Range (THRR)  Yes       Intervention  Provide education and explanation of THRR including how the numbers were predicted and where they are located for reference       Expected Outcomes  Short Term: Able to state/look up THRR;Long Term: Able to use THRR to govern intensity when exercising independently;Short Term: Able to use daily as guideline for intensity in rehab       Able to check pulse independently  Yes       Intervention  Provide education and demonstration on how to check pulse in carotid and radial arteries.;Review the importance of being able to check your own pulse for safety during independent exercise       Expected Outcomes  Short Term: Able to explain why pulse checking is important during independent exercise;Long Term: Able to check pulse independently and accurately       Understanding of Exercise Prescription  Yes       Intervention  Provide education,  explanation, and written materials on patient's individual exercise prescription       Expected Outcomes  Short Term: Able to explain program exercise prescription;Long Term: Able to explain home exercise prescription to exercise independently          Exercise Goals Re-Evaluation: Exercise Goals Re-Evaluation    [place] Name 09/05/19 1153 09/10/19 1057 10/08/19 1137         Exercise Goal Re-Evaluation   Exercise Goals Review  Increase Physical Activity;Able to understand and use rate of perceived exertion (RPE) scale  Increase Physical Activity;Able to understand and use rate of perceived exertion (RPE) scale;Able to check pulse independently;Knowledge and understanding of Target Heart Rate Range (THRR);Understanding of Exercise Prescription  Increase Physical Activity;Able to understand and use rate of perceived exertion (RPE) scale;Able to check pulse independently;Knowledge and understanding of Target Heart Rate Range (THRR);Understanding of Exercise Prescription;Increase Strength and Stamina     Comments  Patient able to understand and use RPE scale appropriately. Pt  had some right arm pain on arm ergometer from weakness in that arm as a result of hx of Polio. Switched to lighter arm ergometer.  Switched patient from arm ergometer to recumbent stepper for 30 minutes because chronic right arm pain. Reviewed home exercise guidelines with patient including THRR, RPE scale, and endpoints for exercise. Pt is able to check pulse independently.  Patient will complete the cardiac rehab program soon and plans to continue current exercise routine, walking ~1 mile, 3 days/week. Pt also has a treadmill at home that he can use for exercise. Pt has 4lb weights at home.     Expected Outcomes  Progress workloads as tolerated to help improve cardiorespiratory fitness.  Patient will add at least one day of walking at home in addition to exercise at cardiac rehab.  Patient will walk at least 3 days/week to help improve  strength and stamina.        Nutrition & Weight - Outcomes: Pre Biometrics - 08/28/19 1032      Pre Biometrics   Height  5' 6.25" (1.683 m)    Weight  55.1 kg    Waist Circumference  29.5 inches    Hip Circumference  34 inches    Waist to Hip Ratio  0.87 %    BMI (Calculated)  19.45    Triceps Skinfold  21 mm    % Body Fat  21.6 %    Grip Strength  22 kg    Flexibility  0 in    Single Leg Stand  7.43 seconds      Post Biometrics - 10/08/19 1100       Post  Biometrics   Height  5' 6.25" (1.683 m)    Weight  54.9 kg    Waist Circumference  30.5 inches    Hip Circumference  34.5 inches    Waist to Hip Ratio  0.88 %    BMI (Calculated)  19.38    Triceps Skinfold  13 mm    % Body Fat  20.1 %    Grip Strength  25 kg    Flexibility  0 in    Single Leg Stand  4.31 seconds       Nutrition:   Nutrition Discharge:   Education Questionnaire Score: Knowledge Questionnaire Score - 10/09/19 1042      Knowledge Questionnaire Score   Pre Score  22/24    Post Score  21/24       Goals reviewed with patient; copy given to patient.

## 2019-10-17 NOTE — ED Notes (Signed)
Family member wants to give Pt night time med's Advised not to give med's family member assisted Pt with home med's.

## 2019-10-17 NOTE — ED Triage Notes (Signed)
Patient reports right hand intermittent shaking this evening , denies injury , equal grips with no arm drift / no facial asymmetry.

## 2019-10-18 ENCOUNTER — Telehealth (HOSPITAL_COMMUNITY): Payer: Self-pay | Admitting: *Deleted

## 2019-10-18 NOTE — Telephone Encounter (Signed)
[name] called to report pt had his dental extraction done on Tue and held his Xarelto, Sun-Tue as ordered by [place] and restarted it last night  However today he has started bleeding and they want to know if pt can hold longer or not.  Reviewed info and chart w/Lauren Hubbard Robinson D she advised since active bleeding can hold Xarelto 1 or 2 more days.  [name] is aware and will advise surgeon.

## 2019-10-18 NOTE — ED Notes (Signed)
Pt said he could not wait and was going home.

## 2019-10-19 ENCOUNTER — Other Ambulatory Visit (HOSPITAL_BASED_OUTPATIENT_CLINIC_OR_DEPARTMENT_OTHER): Payer: Self-pay | Admitting: Internal Medicine

## 2019-10-19 ENCOUNTER — Other Ambulatory Visit: Payer: Self-pay | Admitting: Internal Medicine

## 2019-10-19 ENCOUNTER — Other Ambulatory Visit (HOSPITAL_COMMUNITY): Payer: Self-pay | Admitting: Internal Medicine

## 2019-10-19 DIAGNOSIS — I2510 Atherosclerotic heart disease of native coronary artery without angina pectoris: Secondary | ICD-10-CM | POA: Diagnosis not present

## 2019-10-19 DIAGNOSIS — I480 Paroxysmal atrial fibrillation: Secondary | ICD-10-CM | POA: Diagnosis not present

## 2019-10-19 DIAGNOSIS — R29898 Other symptoms and signs involving the musculoskeletal system: Secondary | ICD-10-CM

## 2019-10-19 DIAGNOSIS — R531 Weakness: Secondary | ICD-10-CM | POA: Diagnosis not present

## 2019-10-19 DIAGNOSIS — R259 Unspecified abnormal involuntary movements: Secondary | ICD-10-CM | POA: Diagnosis not present

## 2019-10-20 ENCOUNTER — Encounter (HOSPITAL_COMMUNITY): Payer: Self-pay | Admitting: Emergency Medicine

## 2019-10-20 ENCOUNTER — Inpatient Hospital Stay (HOSPITAL_COMMUNITY): Payer: Medicare Other

## 2019-10-20 ENCOUNTER — Other Ambulatory Visit: Payer: Self-pay

## 2019-10-20 ENCOUNTER — Inpatient Hospital Stay (HOSPITAL_COMMUNITY)
Admission: EM | Admit: 2019-10-20 | Discharge: 2019-10-22 | DRG: 065 | Disposition: A | Discharge status: Home / Self Care | Payer: Medicare Other | Attending: Family Medicine | Admitting: Family Medicine

## 2019-10-20 ENCOUNTER — Ambulatory Visit (HOSPITAL_BASED_OUTPATIENT_CLINIC_OR_DEPARTMENT_OTHER)
Admission: RE | Admit: 2019-10-20 | Discharge: 2019-10-20 | Disposition: A | Discharge status: Home / Self Care | Payer: Medicare Other | Source: Ambulatory Visit | Attending: Internal Medicine | Admitting: Internal Medicine

## 2019-10-20 DIAGNOSIS — G14 Postpolio syndrome: Secondary | ICD-10-CM | POA: Diagnosis present

## 2019-10-20 DIAGNOSIS — G4733 Obstructive sleep apnea (adult) (pediatric): Secondary | ICD-10-CM | POA: Diagnosis present

## 2019-10-20 DIAGNOSIS — M419 Scoliosis, unspecified: Secondary | ICD-10-CM | POA: Diagnosis present

## 2019-10-20 DIAGNOSIS — K91840 Postprocedural hemorrhage and hematoma of a digestive system organ or structure following a digestive system procedure: Secondary | ICD-10-CM | POA: Diagnosis present

## 2019-10-20 DIAGNOSIS — Z8249 Family history of ischemic heart disease and other diseases of the circulatory system: Secondary | ICD-10-CM

## 2019-10-20 DIAGNOSIS — Z951 Presence of aortocoronary bypass graft: Secondary | ICD-10-CM | POA: Diagnosis not present

## 2019-10-20 DIAGNOSIS — I6389 Other cerebral infarction: Secondary | ICD-10-CM | POA: Diagnosis not present

## 2019-10-20 DIAGNOSIS — G8191 Hemiplegia, unspecified affecting right dominant side: Secondary | ICD-10-CM | POA: Diagnosis present

## 2019-10-20 DIAGNOSIS — R29898 Other symptoms and signs involving the musculoskeletal system: Secondary | ICD-10-CM

## 2019-10-20 DIAGNOSIS — K219 Gastro-esophageal reflux disease without esophagitis: Secondary | ICD-10-CM | POA: Diagnosis present

## 2019-10-20 DIAGNOSIS — I7300 Raynaud's syndrome without gangrene: Secondary | ICD-10-CM | POA: Diagnosis present

## 2019-10-20 DIAGNOSIS — E785 Hyperlipidemia, unspecified: Secondary | ICD-10-CM | POA: Diagnosis present

## 2019-10-20 DIAGNOSIS — G902 Horner's syndrome: Secondary | ICD-10-CM | POA: Diagnosis present

## 2019-10-20 DIAGNOSIS — Z7901 Long term (current) use of anticoagulants: Secondary | ICD-10-CM

## 2019-10-20 DIAGNOSIS — Z955 Presence of coronary angioplasty implant and graft: Secondary | ICD-10-CM | POA: Diagnosis not present

## 2019-10-20 DIAGNOSIS — I5032 Chronic diastolic (congestive) heart failure: Secondary | ICD-10-CM | POA: Diagnosis present

## 2019-10-20 DIAGNOSIS — I4821 Permanent atrial fibrillation: Secondary | ICD-10-CM | POA: Diagnosis present

## 2019-10-20 DIAGNOSIS — Z87891 Personal history of nicotine dependence: Secondary | ICD-10-CM | POA: Diagnosis not present

## 2019-10-20 DIAGNOSIS — I639 Cerebral infarction, unspecified: Principal | ICD-10-CM | POA: Diagnosis present

## 2019-10-20 DIAGNOSIS — I130 Hypertensive heart and chronic kidney disease with heart failure and stage 1 through stage 4 chronic kidney disease, or unspecified chronic kidney disease: Secondary | ICD-10-CM | POA: Diagnosis present

## 2019-10-20 DIAGNOSIS — Z91048 Other nonmedicinal substance allergy status: Secondary | ICD-10-CM

## 2019-10-20 DIAGNOSIS — N1832 Chronic kidney disease, stage 3b: Secondary | ICD-10-CM | POA: Diagnosis present

## 2019-10-20 DIAGNOSIS — I6300 Cerebral infarction due to thrombosis of unspecified precerebral artery: Secondary | ICD-10-CM

## 2019-10-20 DIAGNOSIS — Y848 Other medical procedures as the cause of abnormal reaction of the patient, or of later complication, without mention of misadventure at the time of the procedure: Secondary | ICD-10-CM | POA: Diagnosis present

## 2019-10-20 DIAGNOSIS — Z20828 Contact with and (suspected) exposure to other viral communicable diseases: Secondary | ICD-10-CM | POA: Diagnosis present

## 2019-10-20 DIAGNOSIS — D72829 Elevated white blood cell count, unspecified: Secondary | ICD-10-CM | POA: Diagnosis present

## 2019-10-20 DIAGNOSIS — J449 Chronic obstructive pulmonary disease, unspecified: Secondary | ICD-10-CM | POA: Diagnosis present

## 2019-10-20 DIAGNOSIS — I480 Paroxysmal atrial fibrillation: Secondary | ICD-10-CM | POA: Diagnosis not present

## 2019-10-20 DIAGNOSIS — I2510 Atherosclerotic heart disease of native coronary artery without angina pectoris: Secondary | ICD-10-CM | POA: Diagnosis present

## 2019-10-20 DIAGNOSIS — Z8042 Family history of malignant neoplasm of prostate: Secondary | ICD-10-CM

## 2019-10-20 DIAGNOSIS — I252 Old myocardial infarction: Secondary | ICD-10-CM | POA: Diagnosis not present

## 2019-10-20 DIAGNOSIS — R29700 NIHSS score 0: Secondary | ICD-10-CM | POA: Diagnosis present

## 2019-10-20 DIAGNOSIS — Z885 Allergy status to narcotic agent status: Secondary | ICD-10-CM

## 2019-10-20 DIAGNOSIS — K068 Other specified disorders of gingiva and edentulous alveolar ridge: Secondary | ICD-10-CM | POA: Diagnosis not present

## 2019-10-20 DIAGNOSIS — R270 Ataxia, unspecified: Secondary | ICD-10-CM | POA: Diagnosis present

## 2019-10-20 DIAGNOSIS — Z802 Family history of malignant neoplasm of other respiratory and intrathoracic organs: Secondary | ICD-10-CM | POA: Diagnosis not present

## 2019-10-20 DIAGNOSIS — K0889 Other specified disorders of teeth and supporting structures: Secondary | ICD-10-CM | POA: Diagnosis not present

## 2019-10-20 DIAGNOSIS — I63232 Cerebral infarction due to unspecified occlusion or stenosis of left carotid arteries: Secondary | ICD-10-CM | POA: Diagnosis not present

## 2019-10-20 DIAGNOSIS — F329 Major depressive disorder, single episode, unspecified: Secondary | ICD-10-CM | POA: Diagnosis present

## 2019-10-20 DIAGNOSIS — R7302 Impaired glucose tolerance (oral): Secondary | ICD-10-CM | POA: Diagnosis present

## 2019-10-20 DIAGNOSIS — I4891 Unspecified atrial fibrillation: Secondary | ICD-10-CM | POA: Diagnosis present

## 2019-10-20 DIAGNOSIS — I441 Atrioventricular block, second degree: Secondary | ICD-10-CM | POA: Diagnosis present

## 2019-10-20 DIAGNOSIS — R259 Unspecified abnormal involuntary movements: Secondary | ICD-10-CM | POA: Diagnosis not present

## 2019-10-20 DIAGNOSIS — I10 Essential (primary) hypertension: Secondary | ICD-10-CM | POA: Diagnosis not present

## 2019-10-20 DIAGNOSIS — Z7902 Long term (current) use of antithrombotics/antiplatelets: Secondary | ICD-10-CM

## 2019-10-20 LAB — DIFFERENTIAL
Abs Immature Granulocytes: 0.06 10*3/uL (ref 0.00–0.07)
Basophils Absolute: 0.1 10*3/uL (ref 0.0–0.1)
Basophils Relative: 1 %
Eosinophils Absolute: 0 10*3/uL (ref 0.0–0.5)
Eosinophils Relative: 0 %
Immature Granulocytes: 1 %
Lymphocytes Relative: 12 %
Lymphs Abs: 1.3 10*3/uL (ref 0.7–4.0)
Monocytes Absolute: 0.9 10*3/uL (ref 0.1–1.0)
Monocytes Relative: 8 %
Neutro Abs: 8.5 10*3/uL — ABNORMAL HIGH (ref 1.7–7.7)
Neutrophils Relative %: 78 %

## 2019-10-20 LAB — COMPREHENSIVE METABOLIC PANEL
ALT: 28 U/L (ref 0–44)
AST: 37 U/L (ref 15–41)
Albumin: 4.3 g/dL (ref 3.5–5.0)
Alkaline Phosphatase: 91 U/L (ref 38–126)
Anion gap: 13 (ref 5–15)
BUN: 25 mg/dL — ABNORMAL HIGH (ref 8–23)
CO2: 24 mmol/L (ref 22–32)
Calcium: 10.6 mg/dL — ABNORMAL HIGH (ref 8.9–10.3)
Chloride: 101 mmol/L (ref 98–111)
Creatinine, Ser: 1.71 mg/dL — ABNORMAL HIGH (ref 0.61–1.24)
GFR calc Af Amer: 43 mL/min — ABNORMAL LOW (ref >60)
GFR calc non Af Amer: 37 mL/min — ABNORMAL LOW (ref >60)
Glucose, Bld: 119 mg/dL — ABNORMAL HIGH (ref 70–99)
Potassium: 5.1 mmol/L (ref 3.5–5.1)
Sodium: 138 mmol/L (ref 135–145)
Total Bilirubin: 1.9 mg/dL — ABNORMAL HIGH (ref 0.3–1.2)
Total Protein: 7.9 g/dL (ref 6.5–8.1)

## 2019-10-20 LAB — PROTIME-INR
INR: 1.6 — ABNORMAL HIGH (ref 0.8–1.2)
Prothrombin Time: 19.1 seconds — ABNORMAL HIGH (ref 11.4–15.2)

## 2019-10-20 LAB — CBC
HCT: 49.1 % (ref 39.0–52.0)
Hemoglobin: 15.9 g/dL (ref 13.0–17.0)
MCH: 31.7 pg (ref 26.0–34.0)
MCHC: 32.4 g/dL (ref 30.0–36.0)
MCV: 98 fL (ref 80.0–100.0)
Platelets: 217 10*3/uL (ref 150–400)
RBC: 5.01 MIL/uL (ref 4.22–5.81)
RDW: 12.8 % (ref 11.5–15.5)
WBC: 10.8 10*3/uL — ABNORMAL HIGH (ref 4.0–10.5)
nRBC: 0 % (ref 0.0–0.2)

## 2019-10-20 LAB — APTT: aPTT: 36 seconds (ref 24–36)

## 2019-10-20 MED ORDER — SODIUM CHLORIDE 0.9% FLUSH: 3.0000 mL | Freq: Once | INTRAVENOUS | Status: DC

## 2019-10-20 MED ORDER — ACETAMINOPHEN 650 MG RE SUPP: 650.0000 mg | RECTAL | Status: DC | PRN

## 2019-10-20 MED ORDER — ACETAMINOPHEN 325 MG PO TABS
650.0000 mg | ORAL_TABLET | ORAL | Status: DC | PRN
  Administered 2019-10-20 – 2019-10-22 (×3): 650 mg via ORAL
  Filled 2019-10-20 (×3): qty 2

## 2019-10-20 MED ORDER — ROSUVASTATIN CALCIUM 20 MG PO TABS
40.0000 mg | ORAL_TABLET | Freq: Every day | ORAL | Status: DC
  Administered 2019-10-21 – 2019-10-22 (×2): 40 mg via ORAL
  Filled 2019-10-20 (×2): qty 2

## 2019-10-20 MED ORDER — DICYCLOMINE HCL 10 MG PO CAPS
10.0000 mg | ORAL_CAPSULE | Freq: Three times a day (TID) | ORAL | Status: DC | PRN
  Administered 2019-10-20: 10 mg via ORAL
  Filled 2019-10-20 (×2): qty 1

## 2019-10-20 MED ORDER — MAGNESIUM OXIDE 400 (241.3 MG) MG PO TABS
400.0000 mg | ORAL_TABLET | Freq: Every day | ORAL | Status: DC
  Administered 2019-10-21 – 2019-10-22 (×2): 400 mg via ORAL
  Filled 2019-10-20 (×3): qty 1

## 2019-10-20 MED ORDER — FAMOTIDINE 20 MG PO TABS: 40.0000 mg | ORAL_TABLET | Freq: Every day | ORAL | Status: DC

## 2019-10-20 MED ORDER — ALIGN 4 MG PO CAPS: 4.0000 mg | ORAL_CAPSULE | Freq: Every day | ORAL | Status: DC

## 2019-10-20 MED ORDER — NEBIVOLOL HCL 5 MG PO TABS
5.0000 mg | ORAL_TABLET | Freq: Every day | ORAL | Status: DC
  Filled 2019-10-20: qty 1

## 2019-10-20 MED ORDER — ACETAMINOPHEN 160 MG/5ML PO SOLN: 650.0000 mg | ORAL | Status: DC | PRN

## 2019-10-20 MED ORDER — CLOPIDOGREL BISULFATE 75 MG PO TABS
75.0000 mg | ORAL_TABLET | Freq: Every day | ORAL | Status: DC
  Administered 2019-10-21 – 2019-10-22 (×2): 75 mg via ORAL
  Filled 2019-10-20 (×2): qty 1

## 2019-10-20 MED ORDER — GADOBUTROL 1 MMOL/ML IV SOLN
5.0000 mL | Freq: Once | INTRAVENOUS | Status: AC | PRN
  Administered 2019-10-20: 22:00:00 5 mL via INTRAVENOUS

## 2019-10-20 MED ORDER — LORAZEPAM 0.5 MG PO TABS
0.5000 mg | ORAL_TABLET | Freq: Every day | ORAL | Status: DC
  Administered 2019-10-20 – 2019-10-21 (×2): 0.5 mg via ORAL
  Filled 2019-10-20 (×2): qty 1

## 2019-10-20 MED ORDER — SODIUM CHLORIDE 0.9 % IV SOLN
INTRAVENOUS | Status: DC
  Administered 2019-10-21: 02:00:00 via INTRAVENOUS

## 2019-10-20 MED ORDER — TRANEXAMIC ACID 1000 MG/10ML IV SOLN
500.0000 mg | Freq: Once | INTRAVENOUS | Status: AC
  Administered 2019-10-20: 500 mg via TOPICAL
  Filled 2019-10-20 (×2): qty 10

## 2019-10-20 MED ORDER — APIXABAN 2.5 MG PO TABS
2.5000 mg | ORAL_TABLET | Freq: Two times a day (BID) | ORAL | Status: DC
  Administered 2019-10-20 – 2019-10-22 (×4): 2.5 mg via ORAL
  Filled 2019-10-20 (×5): qty 1

## 2019-10-20 MED ORDER — STROKE: EARLY STAGES OF RECOVERY BOOK
Freq: Once | Status: AC
  Administered 2019-10-21: 15:00:00
  Filled 2019-10-20: qty 1

## 2019-10-20 MED ORDER — HYDROCODONE-ACETAMINOPHEN 5-325 MG PO TABS
1.0000 | ORAL_TABLET | Freq: Four times a day (QID) | ORAL | Status: DC | PRN
  Administered 2019-10-22 (×2): 1 via ORAL
  Filled 2019-10-20 (×2): qty 1

## 2019-10-20 MED ORDER — LIDOCAINE-EPINEPHRINE 2 %-1:100000 IJ SOLN
1.7000 mL | Freq: Once | INTRAMUSCULAR | Status: AC
  Administered 2019-10-20: 1.7 mL
  Filled 2019-10-20 (×3): qty 1.7

## 2019-10-20 MED ORDER — TRAZODONE HCL 50 MG PO TABS
50.0000 mg | ORAL_TABLET | Freq: Every day | ORAL | Status: DC
  Administered 2019-10-20 – 2019-10-21 (×2): 50 mg via ORAL
  Filled 2019-10-20 (×2): qty 1

## 2019-10-20 MED ORDER — DOFETILIDE 250 MCG PO CAPS
250.0000 ug | ORAL_CAPSULE | Freq: Two times a day (BID) | ORAL | Status: DC
  Administered 2019-10-20 – 2019-10-22 (×4): 250 ug via ORAL
  Filled 2019-10-20 (×6): qty 1

## 2019-10-20 MED ORDER — FAMOTIDINE 20 MG PO TABS
20.0000 mg | ORAL_TABLET | Freq: Every day | ORAL | Status: DC
  Administered 2019-10-20 – 2019-10-21 (×2): 20 mg via ORAL
  Filled 2019-10-20 (×2): qty 1

## 2019-10-20 MED ORDER — SENNOSIDES-DOCUSATE SODIUM 8.6-50 MG PO TABS: 1.0000 | ORAL_TABLET | Freq: Every evening | ORAL | Status: DC | PRN

## 2019-10-20 MED ORDER — METHOCARBAMOL 500 MG PO TABS
500.0000 mg | ORAL_TABLET | Freq: Two times a day (BID) | ORAL | Status: DC
  Administered 2019-10-20 – 2019-10-22 (×4): 500 mg via ORAL
  Filled 2019-10-20 (×4): qty 1

## 2019-10-20 MED ORDER — LIDOCAINE-EPINEPHRINE (PF) 2 %-1:200000 IJ SOLN
INTRAMUSCULAR | Status: AC
  Administered 2019-10-21
  Filled 2019-10-20: qty 20

## 2019-10-20 MED ORDER — EZETIMIBE 10 MG PO TABS
10.0000 mg | ORAL_TABLET | Freq: Every day | ORAL | Status: DC
  Administered 2019-10-21 – 2019-10-22 (×2): 10 mg via ORAL
  Filled 2019-10-20 (×2): qty 1

## 2019-10-20 NOTE — ED Provider Notes (Signed)
Dental Block  Date/Time: 10/20/2019 6:21 PM Performed by: Blanchie Dessert, MD Authorized by: Blanchie Dessert, MD   Consent:    Consent obtained:  Verbal   Consent given by:  Patient and spouse   Risks discussed:  Pain   Alternatives discussed:  No treatment Indications:    Indications: post-dental procedure pain     Indications comment:  Persistent bleeding Location:    Anesthesia block type: apical block. Procedure details (see MAR for exact dosages):    Syringe type:  Controlled syringe   Needle gauge:  25 G   Anesthetic injected:  Lidocaine 2% WITH epi   Injection procedure:  Introduced needle, incremental injection, negative aspiration for blood and anatomic landmarks identified Post-procedure details:    Outcome:  Pain improved (bleeding decreased)   Patient tolerance of procedure:  Tolerated well, no immediate complications Comments:     After injections with lidocaine with epi and direct pressure for 84min with lidocaine with epi soaked gauze bleeding has stopped.      Blanchie Dessert, MD 10/20/19 [name]

## 2019-10-20 NOTE — ED Notes (Signed)
Patient in MRI 

## 2019-10-20 NOTE — Progress Notes (Addendum)
ANTICOAGULATION CONSULT NOTE - Initial Consult  Pharmacy Consult for Apixaban  Indication: atrial fibrillation  Allergies  Allergen Reactions  . Morphine And Related Other (See Comments)    IV forms- vein irritation  . Tape Other (See Comments)    Skin Irritation    Patient Measurements: Height: 5\' 6"  (167.6 cm) Weight: 110 lb (49.9 kg) IBW/kg (Calculated) : 63.8 Heparin Dosing Weight: 49.4 kg  Vital Signs: Temp: 98.4 F (36.9 C) (10/24 1245) Temp Source: Oral (10/24 1245) BP: 133/71 (10/24 1245) Pulse Rate: 64 (10/24 1245)  Labs: Recent Labs    10/20/19 1304  HGB 15.9  HCT 49.1  PLT 217  APTT 36  LABPROT 19.1*  INR 1.6*  CREATININE 1.71*    Estimated Creatinine Clearance: 23.9 mL/min (A) (by C-G formula based on SCr of 1.71 mg/dL (H)).   Medical History: Past Medical History:  Diagnosis Date  . Abnormal nuclear cardiac imaging test March 2011   Has positive EKG response, no perfusion defect and normal EF  . Adenomatous colon polyp 12/2002  . Arrhythmia   . Atrial fibrillation ([place])    a. s/p rfca;  b. chronic tikosyn and xarelto.  . 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 Cataract    bil cataracts removed  . Chronic diastolic CHF (congestive heart failure) ([place])    a. 04/2017 Echo: EF 65-70%, restrictive physiology, mildly dil LA.  [name] Complication of anesthesia   . COPD (chronic obstructive pulmonary disease) ([place])   . Coronary artery disease    First MI in 1988. PCI in 1996 with subsequent CABG in 1996 due to restenosis. S/P stents to LCX in 2009. Noted to have residual disease in the LAD in a diffuse manner and atretic LIMA graft. He is managed medically.   . Difficult intubation    06/08/19 update - Intubated with MAC3 with grade IIb view 7.0 tube passed easily  . Dysrhythmia    Afib  . Esophageal stricture   . GERD (gastroesophageal reflux disease)   . 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 History of Raynaud's syndrome   . Hyperlipidemia   . Hypertension   . Internal hemorrhoids   . Muscle spasm   . Myocardial infarction [facility])    MI x1 37 - 5 age  . Neuromuscular disorder ([place])    Polio - age 43  . OSA (obstructive sleep apnea) 10/16/2018   Severe OSA with AHI 37.2/hr on BiPAP  . Persistent atrial fibrillation ([place])   . Polio    age 64  . PVC (premature ventricular contraction)   . Scoliosis    mild    Medications:  (Not in a hospital admission)   Assessment: Patient is a 83 yom that presented with stroke like symptoms after holding his xarelto for a tooth extraction. The patient has a hx of CVA in the past and appears to be having a CVA today. MD is wishing to switch the patient from Xarelto to Apixaban today.  Plan:  - As the patient is > 81 years old, his Scr. Is greater than 1.5 mcg/dl, and he weighs < 60 kg. - Will start the patient on Apixaban 2.5mg  PO BID - Patient's baseline Scr. Appears to be around ~ 1.5 also. - With age and weight qualifying for the reduced dose of Apixaban pharmacy will sign off at this time.   Thank you,   Duanne Limerick PharmD. BCPS  81/24/2020,6:32  PM

## 2019-10-20 NOTE — Consult Note (Addendum)
NEURO HOSPITALIST  CONSULT   Requesting Physician: Dr. Sedonia Small    Chief Complaint:  Stroke  History obtained from:  Patient   HPI:                                                                                                                                         [name] is an 83 y.o. 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 Fib (s/p ablation x 2 on Xarelto), CAD with multiple stents on Plavix, MI, HTN, HLD   Patient was taken off Xarelto 3 days prior to a tooth extraction. Per patient and wife on Wednesday evening after his tooth extraction patient had a sudden onset of right hand uncontrollable flinging movements, with description being most consistent with hemiballismus. He also experienced numbness of the right side of his face. At that time he presented to the ED saying he was having a stroke but after evaluation there was told that it was not a stroke and was sent home. Patient had been off Xarelto for tooth extraction Sunday, Monday and Tuesday. Started Xarelto back on Wednesday night. Tooth extraction began bleeding and he went to the doctors office. Bleeding stopped spontaneously, but physician said that his arm movements were troubling and that he needed an MRI. Due to the bleeding, Xarelto was stopped for another day and then restarted. Patient had MRI Saturday at [facility]. The MRI showed an acute infarct of the left parietal cortex involving the motor strip. Patient then was sent to the ED. He denied any vision changes, CP, SOB or any numbness and tingling today. He denies smoking, EtOH or drug abuse.  ED course:  BP: 178/54   BG: 119  Date last known well: 10/17/2019 tPA Given: No. Outside of window. On xarelto Modified Rankin: Rankin Score=0 NIHSS: 0  Past Medical History:  Diagnosis Date  . Abnormal nuclear cardiac imaging test March  2011   Has positive EKG response, no perfusion defect and normal EF  . Adenomatous colon polyp 12/2002  . Arrhythmia   . Atrial fibrillation ([place])    a. s/p rfca;  b. chronic tikosyn and xarelto.  . 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 Cataract    bil cataracts removed  . Chronic diastolic CHF (congestive heart failure) ([place])    a. 04/2017 Echo: EF 65-70%, restrictive physiology, mildly dil LA.  [name] Complication of anesthesia   . COPD (chronic obstructive pulmonary disease) ([place])   . Coronary artery disease    First MI in  1988. PCI in 1996 with subsequent CABG in 1996 due to restenosis. S/P stents to LCX in 2009. Noted to have residual disease in the LAD in a diffuse manner and atretic LIMA graft. He is managed medically.   . Difficult intubation    06/08/19 update - Intubated with MAC3 with grade IIb view 7.0 tube passed easily  . Dysrhythmia    Afib  . Esophageal stricture   . GERD (gastroesophageal reflux disease)   . 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 History of Raynaud's syndrome   . Hyperlipidemia   . Hypertension   . Internal hemorrhoids   . Muscle spasm   . Myocardial infarction [facility])    MI x1 21 - 13 age  . Neuromuscular disorder ([place])    Polio - age 43  . OSA (obstructive sleep apnea) 10/16/2018   Severe OSA with AHI 37.2/hr on BiPAP  . Persistent atrial fibrillation ([place])   . Polio    age 85  . PVC (premature ventricular contraction)   . Scoliosis    mild    Past Surgical History:  Procedure Laterality Date  . ABLATION     done for a fib  . APPENDECTOMY     Age 26  . ATRIAL FIBRILLATION ABLATION N/A 06/08/2019   Procedure: ATRIAL FIBRILLATION ABLATION;  Surgeon: Constance Haw, MD;  Location: [place] CV LAB;  Service: Cardiovascular;  Laterality: N/A;  . CARDIAC CATHETERIZATION    . CARDIOVERSION N/A 08/22/2013   Procedure: CARDIOVERSION;  Surgeon: Carlena Bjornstad, MD;  Location: [facility]  ENDOSCOPY;  Service: Cardiovascular;  Laterality: N/A;  . CARDIOVERSION N/A 01/16/2014   Procedure: CARDIOVERSION;  Surgeon: Lelon Perla, MD;  Location: [facility] ENDOSCOPY;  Service: Cardiovascular;  Laterality: N/A;  . CARDIOVERSION N/A 12/11/2018   Procedure: CARDIOVERSION;  Surgeon: Larey Dresser, MD;  Location: [facility] ENDOSCOPY;  Service: Cardiovascular;  Laterality: N/A;  . COLONOSCOPY  2008  . CORONARY ARTERY BYPASS GRAFT  1996   with a LIMA to the LAD, SVG to RCA and SVG to OM  . CORONARY STENT INTERVENTION N/A 03/26/2019   Procedure: CORONARY STENT INTERVENTION;  Surgeon: Jettie Booze, MD;  Location: [place] CV LAB;  Service: Cardiovascular;  Laterality: N/A;  . CORONARY STENT PLACEMENT  1996   LCX  . EMBOLECTOMY Right 05/09/2019   Procedure: Embolectomy of right radial artery;  Surgeon: Serafina Mitchell, MD;  Location: [place];  Service: Vascular;  Laterality: Right;  . ESOPHAGEAL DILATION    . EYE SURGERY    . FALSE ANEURYSM REPAIR Right 05/09/2019   Procedure: REPAIR FALSE ANEURYSM RIGHT RADIAL;  Surgeon: Serafina Mitchell, MD;  Location: [place];  Service: Vascular;  Laterality: Right;  . LEFT HEART CATH AND CORS/GRAFTS ANGIOGRAPHY N/A 03/26/2019   Procedure: LEFT HEART CATH AND CORS/GRAFTS ANGIOGRAPHY;  Surgeon: Larey Dresser, MD;  Location: [place] CV LAB;  Service: Cardiovascular;  Laterality: N/A;  . POLYPECTOMY    . TONSILLECTOMY    . UPPER GASTROINTESTINAL ENDOSCOPY     esophegeal dilation    Family History  Problem Relation Age of Onset  . Heart disease Mother   . Heart disease Father   . Heart Problems Brother        heart transplant  . Heart disease Maternal Grandmother   . Heart disease Maternal Grandfather   . Heart disease Paternal Grandmother   . Heart disease Paternal Grandfather   . Prostate cancer Paternal Uncle   .  Cancer Son        thymic cancer.  died at age 66  . Colon cancer Neg Hx   . Esophageal cancer Neg Hx   . Pancreatic cancer Neg  Hx   . Rectal cancer Neg Hx   . Stomach cancer Neg Hx          Social History:  reports that he quit smoking about 52 years ago. His smoking use included cigarettes. He has a 2.50 pack-year smoking history. He quit smokeless tobacco use about 70 years ago. He reports that he does not drink alcohol or use drugs.  Allergies:  Allergies  Allergen Reactions  . Morphine And Related Other (See Comments)    IV forms- vein irritation  . Tape Other (See Comments)    Skin Irritation    Medications:                                                                                                                           Current Facility-Administered Medications  Medication Dose Route Frequency Provider Last Rate Last Dose  . sodium chloride flush (NS) 0.9 % injection 3 mL  3 mL Intravenous Once Maudie Flakes, MD       Current Outpatient Medications  Medication Sig Dispense Refill  . acetaminophen (TYLENOL) 500 MG tablet Take 500 mg by mouth every 8 (eight) hours as needed for mild pain or moderate pain.     [name] albuterol (PROAIR HFA) 108 (90 BASE) MCG/ACT inhaler Inhale 2 puffs into the lungs every 4 (four) hours as needed for wheezing or shortness of breath (and prior to exercise). 1 Inhaler 3  . AMBULATORY NON FORMULARY MEDICATION Apply 1 application topically See admin instructions. Testosterone cream 150mg /15% Place 1 ml  onto the skin daily    . clopidogrel (PLAVIX) 75 MG tablet TAKE 1 TABLET (75 MG TOTAL) BY MOUTH DAILY WITH BREAKFAST. 90 tablet 2  . dicyclomine (BENTYL) 10 MG capsule Take 10 mg by mouth 3 (three) times daily as needed for spasms.    [name] dofetilide (TIKOSYN) 250 MCG capsule TAKE 1 CAPSULE BY MOUTH EVERY 12 HOURS *NEED OFFICE VISIT* (Patient taking differently: Take 250 mcg by mouth every 12 (twelve) hours. ) 180 capsule 3  . ezetimibe (ZETIA) 10 MG tablet TAKE 1 TABLET BY MOUTH EVERY DAY (Patient taking differently: Take 10 mg by mouth daily. ) 90 tablet 3  . famotidine  (PEPCID) 40 MG tablet Take 1 tablet (40 mg total) by mouth at bedtime. 30 tablet 11  . furosemide (LASIX) 40 MG tablet Take 0.5 tablets (20 mg total) by mouth daily. 15 tablet 6  . HYDROcodone-acetaminophen (NORCO/VICODIN) 5-325 MG tablet Take 1 tablet by mouth every 6 (six) hours as needed (For mouth pain).    [name] lactobacillus acidophilus & bulgar (LACTINEX) chewable tablet Chew 2 tablets by mouth daily. (0800)    . loperamide (IMODIUM) 2 MG capsule Take 2 mg by mouth 2 (two) times daily as  needed for diarrhea or loose stools. One or two tablets daily as needed    . LORazepam (ATIVAN) 0.5 MG tablet Take 0.5 mg by mouth at bedtime.     . magnesium oxide (MAG-OX) 400 MG tablet Take 400 mg by mouth daily.    . methocarbamol (ROBAXIN) 500 MG tablet Take 500 mg by mouth 2 (two) times daily.     . nebivolol (BYSTOLIC) 10 MG tablet Take 0.5 tablets (5 mg total) by mouth daily. 90 tablet 1  . nitroGLYCERIN (NITROSTAT) 0.4 MG SL tablet Place 1 tablet (0.4 mg total) under the tongue every 5 (five) minutes as needed for chest pain. 25 tablet 0  . potassium chloride SA (K-DUR) 20 MEQ tablet Take 1 tablet (20 mEq total) by mouth at bedtime. 90 tablet 3  . Probiotic Product (ALIGN) 4 MG CAPS Take 4 mg by mouth daily.    . rosuvastatin (CRESTOR) 40 MG tablet TAKE 1 TABLET BY MOUTH EVERY DAY (Patient taking differently: Take 40 mg by mouth daily. ) 90 tablet 3  . sodium chloride (OCEAN) 0.65 % SOLN nasal spray Place 1 spray into both nostrils as needed for congestion.    . traZODone (DESYREL) 50 MG tablet Take 50 mg by mouth at bedtime.    . Vitamin D, Ergocalciferol, (DRISDOL) 50000 UNITS CAPS capsule Take 50,000 Units by mouth every 14 (fourteen) days. Take on 1st and 15th    . XARELTO 15 MG TABS tablet TAKE 1 TABLET BY MOUTH EVERY DAY IN THE EVENING (Patient taking differently: Take 15 mg by mouth at bedtime. ) 90 tablet 1     ROS:                                                                                                                                        ROS was performed and is negative except as noted in HPI    General Examination:                                                                                                      Blood pressure 133/71, pulse 64, temperature 98.4 F (36.9 C), temperature source Oral, resp. rate 18, height 5\' 6"  (1.676 m), weight 49.9 kg, SpO2 98 %.  HEENT-  Normocephalic, no lesions, without obvious abnormality.  Normal external eye and conjunctiva. Cardiovascular-  pulses palpable throughout  Lungs-n no excessive working breathing.  Saturations within normal limits on RA Extremities- Warm, dry and intact Musculoskeletal-no joint tenderness,  some atrophy of right shoulder from polio. Skin-warm and dry, no hyperpigmentation, vitiligo, or suspicious lesions  Neurological Examination Mental Status: Alert, oriented, thought content appropriate.  Speech fluent without evidence of aphasia.  Able to follow  commands without difficulty. Cranial Nerves: [id number] fields grossly normal,  III,IV, VI: ptosis not present, extra-ocular motions intact bilaterally, pupils equal, round, reactive to light and accommodation V,VII: smile symmetric, facial light touch sensation normal bilaterally VIII: hearing normal bilaterally IX,X: uvula rises midline XI: bilateral shoulder shrug XII: midline tongue extension     Right  Left Shoulder Abduction  2/5  5/5 Elbow Flexion   5/5  5/5 Elbow Extension  2/5  5/5 Hip Flexion   4+/5  5/5 Knee extension  5/5  5/5 ADF/APF   5/5  5/5 NOTE: muscle weakness in RUE is chronic d/t polio    Tone and bulk:normal tone throughout; some right shoulder atrophy noted which is present d/t polio. Sensory: Light touch intact throughout, bilaterally Deep Tendon Reflexes: Hypoactive patellae, biceps, brachioradialis reflexes bilaterally without asymmetry Plantars: Right: downgoing   Left: downgoing Cerebellar: No ataxia with  finger-to-nose bilaterally. Had difficulty on the right due to chronic shoulder and triceps weakness. Gait: Deferred   Lab Results: Basic Metabolic Panel: Recent Labs  Lab 10/20/19 1304  NA 138  K 5.1  CL 101  CO2 24  GLUCOSE 119*  BUN 25*  CREATININE 1.71*  CALCIUM 10.6*    CBC: Recent Labs  Lab 10/20/19 1304  WBC 10.8*  NEUTROABS 8.5*  HGB 15.9  HCT 49.1  MCV 98.0  PLT 217    Imaging: Mr Brain Wo Contrast  Result Date: 10/20/2019 CLINICAL DATA:  Right arm weakness EXAM: MRI HEAD WITHOUT CONTRAST TECHNIQUE: Multiplanar, multiecho pulse sequences of the brain and surrounding structures were obtained without intravenous contrast. COMPARISON:  CT head 07/12/2013 FINDINGS: Brain: Restricted diffusion in the left parietal cortex in the region the motor strip compatible with acute infarct. There is diffuse cortical edema. No associated hemorrhage. No other acute infarct. Mild atrophy without hydrocephalus. No significant chronic ischemia. Chronic microhemorrhage left temporoparietal periventricular white matter, separate from the area of acute infarct. Negative for midline shift. Vascular: Normal arterial flow voids. Skull and upper cervical spine: Negative Sinuses/Orbits: Mild mucosal edema paranasal sinuses. Bilateral cataract surgery Other: Image quality degraded by mild motion IMPRESSION: Acute infarct left parietal cortex involving the motor strip accounting for left arm weakness. No associated hemorrhage. These results will be called to the ordering clinician or representative by the Radiologist Assistant, and communication documented in the PACS or zVision Dashboard. Electronically Signed   By: Franchot Gallo M.D.   On: 10/20/2019 11:01     Laurey Morale, MSN, NP-C [facility] [phone] 10/20/2019, 4:31 PM    Assessment: 83 y.o. male presenting with acute onset of hemiballismus involving the RUE.  1. PMHx significant for CVA (1996 brainstem with  residual Horner syndrome and occasional difficulty swallowing), A. Fib (s/p ablation x 2 on Xarelto), CAD with multiple stents on Plavix, MI, HTN, HLD.  2. MRI showed a subacute infarct of the left sensory strip. Although acute infarction is most likely, there is a small likelihood of focal seizures with cytotoxic cortical edema explaining the circumscribed left parietal cortical lesion as well. Will need admission for EEG and complete stroke work-up. 3. Stroke Risk Factors - Prior stroke, CHF, CAD, atrial fibrillation, hyperlipidemia and hypertension 4. A. Fib (s/p ablation x 2, on Xarelto). Missed a total of four days of this medication  in the setting of tooth extraction. Due to poor renal function, I agree with Hospitalist service that switching to Eliquis is indicated.  5. Unable to obtain CTA due to poor renal function.    6. CAD with multiple stents on Plavix    Recommendations: -- BP goal : Permissive HTN upto 220/110 mmHg (for 24-48 post admission )  -- TTE -- MRA of head -- Carotid ultrasound -- Control oral bleeding; consider xarelto  -- High intensity Statin if LDL > 70  -- HgbA1c, fasting lipid panel -- PT consult, OT consult, Speech consult -- Telemetry monitoring -- Frequent neuro checks -- Stroke swallow screen  -- Due to poor renal function, I agree with Hospitalist service that switching Xarelto to Eliquis is indicated. The recommended dose for patients with impaired renal function and Cr > 1.5, or age > 22 or weight < 60 kg is half the usual dose, therefore 2.5 mg BID should be prescribed -- Will need to be on Plavix due to risk of stent restenosis. -- EEG -- Oral surgery has been consulted for the patient's refractory oral bleeding -- Please page stroke NP  Or  PA  Or MD from 8am -4 pm  as this patient from this time will be  followed by the stroke.   You can look them up on [email or web address]  Password [email or web address]  I have seen and examined the patient. I have formulated the  assessment and recommendations. My exam findings were observed and documented by Laurey Morale, NP. Electronically signed: Dr. Kerney Elbe

## 2019-10-20 NOTE — ED Provider Notes (Signed)
[facility] Emergency Department Provider Note MRN:  [id number]  Arrival date & time: 10/20/19     Chief Complaint   Cerebrovascular Accident   History of Present Illness   [name] is a 83 y.o. year-old male with a history of A. fib, CAD, stroke presenting to the ED with chief complaint of stroke.  Patient experienced sudden onset right arm numbness and weakness, mild weakness to the right leg this past Wednesday.  Came to the emergency department concerned about possible stroke, left without being evaluated after waiting in the waiting room for 4 hours.  Continued symptoms, received outpatient MRI today, revealing acute stroke.  This past Tuesday, patient underwent oral surgery for tooth extraction.  Stopped his Xarelto 3 days prior, restarted it the day after per his doctors requests.  Continued bleeding from the extraction sites since yesterday.  Patient denies chest pain, no shortness of breath, no abdominal pain, no vision changes.  Review of Systems  A complete 10 system review of systems was obtained and all systems are negative except as noted in the HPI and PMH.   Patient's Health History    Past Medical History:  Diagnosis Date  . Abnormal nuclear cardiac imaging test March 2011   Has positive EKG response, no perfusion defect and normal EF  . Adenomatous colon polyp 12/2002  . Arrhythmia   . Atrial fibrillation ([place])    a. s/p rfca;  b. chronic tikosyn and xarelto.  . 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 Cataract    bil cataracts removed  . Chronic diastolic CHF (congestive heart failure) ([place])    a. 04/2017 Echo: EF 65-70%, restrictive physiology, mildly dil LA.  [name] Complication of anesthesia   . COPD (chronic obstructive pulmonary disease) ([place])   . Coronary artery disease    First MI in 1988. PCI in 1996 with subsequent CABG in 1996 due to restenosis. S/P stents to LCX in 2009.  Noted to have residual disease in the LAD in a diffuse manner and atretic LIMA graft. He is managed medically.   . Difficult intubation    06/08/19 update - Intubated with MAC3 with grade IIb view 7.0 tube passed easily  . Dysrhythmia    Afib  . Esophageal stricture   . GERD (gastroesophageal reflux disease)   . 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 History of Raynaud's syndrome   . Hyperlipidemia   . Hypertension   . Internal hemorrhoids   . Muscle spasm   . Myocardial infarction [facility])    MI x1 44 - 53 age  . Neuromuscular disorder ([place])    Polio - age 58  . OSA (obstructive sleep apnea) 10/16/2018   Severe OSA with AHI 37.2/hr on BiPAP  . Persistent atrial fibrillation ([place])   . Polio    age 17  . PVC (premature ventricular contraction)   . Scoliosis    mild    Past Surgical History:  Procedure Laterality Date  . ABLATION     done for a fib  . APPENDECTOMY     Age 39  . ATRIAL FIBRILLATION ABLATION N/A 06/08/2019   Procedure: ATRIAL FIBRILLATION ABLATION;  Surgeon: Constance Haw, MD;  Location: [place] CV LAB;  Service: Cardiovascular;  Laterality: N/A;  . CARDIAC CATHETERIZATION    . CARDIOVERSION N/A 08/22/2013   Procedure: CARDIOVERSION;  Surgeon: Carlena Bjornstad, MD;  Location: [place];  Service:  Cardiovascular;  Laterality: N/A;  . CARDIOVERSION N/A 01/16/2014   Procedure: CARDIOVERSION;  Surgeon: Lelon Perla, MD;  Location[facility] ENDOSCOPY;  Service: Cardiovascular;  Laterality: N/A;  . CARDIOVERSION N/A 12/11/2018   Procedure: CARDIOVERSION;  Surgeon: Larey Dresser, MD;  Location: [facility] ENDOSCOPY;  Service: Cardiovascular;  Laterality: N/A;  . COLONOSCOPY  2008  . CORONARY ARTERY BYPASS GRAFT  1996   with a LIMA to the LAD, SVG to RCA and SVG to OM  . CORONARY STENT INTERVENTION N/A 03/26/2019   Procedure: CORONARY STENT INTERVENTION;  Surgeon: Jettie Booze, MD;  Location: [place] [facility];  Service: Cardiovascular;  Laterality:  N/A;  . CORONARY STENT PLACEMENT  1996   LCX  . EMBOLECTOMY Right 05/09/2019   Procedure: Embolectomy of right radial artery;  Surgeon: Serafina Mitchell, MD;  Location: [place];  Service: Vascular;  Laterality: Right;  . ESOPHAGEAL DILATION    . EYE SURGERY    . FALSE ANEURYSM REPAIR Right 05/09/2019   Procedure: REPAIR FALSE ANEURYSM RIGHT RADIAL;  Surgeon: Serafina Mitchell, MD;  Location: [place];  Service: Vascular;  Laterality: Right;  . LEFT HEART CATH AND CORS/GRAFTS ANGIOGRAPHY N/A 03/26/2019   Procedure: LEFT HEART CATH AND CORS/GRAFTS ANGIOGRAPHY;  Surgeon: Larey Dresser, MD;  Location: [place] [facility];  Service: Cardiovascular;  Laterality: N/A;  . POLYPECTOMY    . TONSILLECTOMY    . UPPER GASTROINTESTINAL ENDOSCOPY     esophegeal dilation    Family History  Problem Relation Age of Onset  . Heart disease Mother   . Heart disease Father   . Heart Problems Brother        heart transplant  . Heart disease Maternal Grandmother   . Heart disease Maternal Grandfather   . Heart disease Paternal Grandmother   . Heart disease Paternal Grandfather   . Prostate cancer Paternal Uncle   . Cancer Son        thymic cancer.  died at age 37  . Colon cancer Neg Hx   . Esophageal cancer Neg Hx   . Pancreatic cancer Neg Hx   . Rectal cancer Neg Hx   . Stomach cancer Neg Hx     Social History   Socioeconomic History  . Marital status: Married    Spouse name: Inez Catalina  . Number of children: 2  . Years of education: Not on file  . Highest education level: Not on file  Occupational History  . Occupation: Software engineer  Social Needs  . Financial resource strain: Not hard at all  . Food insecurity    Worry: Never true    Inability: Never true  . Transportation needs    Medical: No    Non-medical: No  Tobacco Use  . Smoking status: Former Smoker    Packs/day: 0.50    Years: 5.00    Pack years: 2.50    Types: Cigarettes    Quit date: 12/27/1966    Years since quitting: 52.8  .  Smokeless tobacco: Former Systems developer    Quit date: 1950  Substance and Sexual Activity  . Alcohol use: No  . Drug use: No  . Sexual activity: Yes  Lifestyle  . Physical activity    Days per week: 0 days    Minutes per session: 0 min  . Stress: To some extent  Relationships  . Social Herbalist on phone: Not on file    Gets together: Not on file  Attends religious service: Not on file    Active member of club or organization: Not on file    Attends meetings of clubs or organizations: Not on file    Relationship status: Not on file  . Intimate partner violence    Fear of current or ex partner: Not on file    Emotionally abused: Not on file    Physically abused: Not on file    Forced sexual activity: Not on file  Other Topics Concern  . 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     Physical Exam  Vital Signs and Nursing Notes reviewed Vitals:   10/20/19 1245  BP: 133/71  Pulse: 64  Resp: 18  Temp: 98.4 F (36.9 C)  SpO2: 98%    CONSTITUTIONAL: Well-appearing, NAD NEURO:  Alert and oriented x 3, subtle weakness to right upper extremity, decreased sensation right upper extremity, normal speech EYES:  eyes equal and reactive ENT/NECK:  no LAD, no JVD, mild oozing of blood from extraction sites of the left upper and lower molars CARDIO: Regular rate, well-perfused, normal S1 and S2 PULM:  CTAB no wheezing or rhonchi GI/GU:  normal bowel sounds, non-distended, non-tender MSK/SPINE:  No gross deformities, no edema SKIN:  no rash, atraumatic PSYCH:  Appropriate speech and behavior  Diagnostic and Interventional Summary    EKG Interpretation  Date/Time:  Saturday October 20 2019 13:09:46 EDT Ventricular Rate:  70 PR Interval:  142 QRS Duration: 104 QT Interval:  420 QTC Calculation: 453 R Axis:   73 Text Interpretation:  Normal sinus rhythm with sinus arrhythmia Nonspecific  ST abnormality Abnormal ECG Confirmed by Gerlene Fee [phone]) on 10/20/2019 2:41:04 PM      Labs Reviewed  PROTIME-INR - Abnormal; Notable for the following components:      Result Value   Prothrombin Time 19.1 (*)    INR 1.6 (*)    All other components within normal limits  CBC - Abnormal; Notable for the following components:   WBC 10.8 (*)    All other components within normal limits  DIFFERENTIAL - Abnormal; Notable for the following components:   Neutro Abs 8.5 (*)    All other components within normal limits  COMPREHENSIVE METABOLIC PANEL - Abnormal; Notable for the following components:   Glucose, Bld 119 (*)    BUN 25 (*)    Creatinine, Ser 1.71 (*)    Calcium 10.6 (*)    Total Bilirubin 1.9 (*)    GFR calc non Af Amer 37 (*)    GFR calc Af Amer 43 (*)    All other components within normal limits  SARS CORONAVIRUS 2 (TAT 6-24 HRS)  APTT    No orders to display    Medications  sodium chloride flush (NS) 0.9 % injection 3 mL (has no administration in time range)  tranexamic acid (CYKLOKAPRON) injection 500 mg (500 mg Topical Given 10/20/19 1529)     .Critical Care Performed by: Maudie Flakes, MD Authorized by: Maudie Flakes, MD   Critical care provider statement:    Critical care time (minutes):  45   Critical care was necessary to treat or prevent imminent or life-threatening deterioration of the following conditions:  CNS failure or compromise (Acute ischemic stroke)   Critical care was time spent personally by me on the following activities:  Discussions with consultants, evaluation of patient's response to treatment,  examination of patient, ordering and performing treatments and interventions, ordering and review of laboratory studies, ordering and review of radiographic studies, pulse oximetry, re-evaluation of patient's condition, obtaining history from patient or surrogate and review of old [place]  ED Course and Medical Decision Making   I have reviewed the triage vital signs and the nursing notes.  Pertinent labs & imaging results that were available during my care of the patient were reviewed by me and considered in my medical decision making (see below for details).  Acute ischemic stroke found on outpatient MRI today, symptom onset 3 days ago.  Will consult neurology, anticipating need for admission for stroke work-up.  Will try TXA topically in the mouth for his continued bleeding.  3:30 PM update: Case discussed with Dr. Cheral Marker of neurology, who does agree with admission for full stroke work-up.  Will admit to medicine service.  Barth Kirks. Sedonia Small, MD [place] [email or web address]  Final Clinical Impressions(s) / ED Diagnoses     ICD-10-CM   1. Acute CVA (cerebrovascular accident) [facility])  I63.9     ED Discharge Orders    None      Discharge Instructions Discussed with and Provided to Patient: Discharge Instructions   None       Maudie Flakes, MD 10/20/19 1530

## 2019-10-20 NOTE — H&P (Signed)
History and Physical    [name] [id number] DOB: 1936/06/14 DOA: 10/20/2019  PCP: Burnard Bunting, MD Consultants:  Aundra Dubin - cardiology; Byrum - pulmonology; Fuller Plan - GI; oral surgery - Rehm Patient coming from:  Home - lives with wife; NOK: Wife, Burnes Soules [id number]  Chief Complaint:  Stroke on outpatient MRI  HPI: [name] is a 83 y.o. male with medical history significant of afib; OSA on BPIAP; HTN; HLD; afib; COPD; CAD s/p CABG; CVA; and chronic diastolic CHF presenting with stroke.  ?related to stopping/starting Xarelto and surgery on his teeth.  He had dental surgery Tuesday, and so Xarelto was held Sunday through Tuesday.  He was not bleeding Wednesday night and he developed acute onset of symptoms with RLE ballismus/fasciculations.  He came to triage and said he thought he was having a stroke.  His wife called cardiology; when they called back his wife says the cardiologist told her "the emergency room says it does not appear to be a stroke and there's nothing we can do and there's nothing the emergency room can do."  So he left the ER without being seen.  He resumed Xarelto on Wednesday night as directed (off Sun-Tues) and by Thursday AM he was bleeding.  They called his PCP on Thursday about his arm and also called the oral surgeon about the bleeding.  They went to the oral surgeon Thursday and it was restitched and he held Thursday night's Xarelto dose; he missed PCP appointment and so saw PCP Friday.  By then, his bleeding had stopped.  PCP scheduled MRI Saturday at [facility].  He started bleeding again this AM after restarting his Xarelto last night (Friday).  Dr. Joylene Draft called him to come in today to the ER due to acute CVA seen on MRI.   His wife called his oral surgeon on the way in to notify him, Dr. Romie Minus, but she has not heard back from him.  He has been bleeding from his left lateral maxilla > a tooth that was pulled along the mid-lower mandible.  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    ED Course:  Came to ER for neurologic sx Wednesday, outpatient MRI showed acute stroke.  Neurology will see and suggests admission for CVA evaluation.  On Plavix and Xarelto.  Review of Systems: As per HPI; otherwise review of systems reviewed and negative.   Ambulatory Status:  Ambulates without assistance  Past Medical History:  Diagnosis Date   Abnormal nuclear cardiac imaging test March 2011   Has positive EKG response, no perfusion defect and normal EF   Adenomatous colon polyp 12/2002   Arrhythmia    Atrial fibrillation ([place])    a. s/p rfca;  b. chronic tikosyn and xarelto.   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 04/2017 Echo: EF 65-70%, restrictive physiology, mildly dil LA.   Complication of anesthesia    COPD (chronic obstructive pulmonary disease) ([place])    Coronary artery disease    First MI in 1988. PCI in 1996 with subsequent CABG in 1996 due to restenosis. S/P stents to LCX in 2009. Noted to have residual disease in the LAD in a diffuse manner and atretic LIMA graft. He is managed medically.  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   History of Raynaud's syndrome    Hyperlipidemia    Hypertension    Internal hemorrhoids    Muscle spasm    Myocardial infarction (HCC)    MI x1 1989 - 68 age   Neuromuscular disorder ([place])    Polio - age 65   OSA (obstructive sleep apnea) 10/16/2018   Severe OSA with AHI 37.2/hr on BiPAP   Persistent atrial fibrillation ([place])    Polio    age 6   PVC  (premature ventricular contraction)    Scoliosis    mild    Past Surgical History:  Procedure Laterality Date   ABLATION     done for a fib   APPENDECTOMY     Age 47   ATRIAL FIBRILLATION ABLATION N/A 06/08/2019   Procedure: ATRIAL FIBRILLATION ABLATION;  Surgeon: Constance Haw, MD;  Location: [place] [facility];  Service: Cardiovascular;  Laterality: N/A;   CARDIAC CATHETERIZATION     CARDIOVERSION N/A 08/22/2013   Procedure: CARDIOVERSION;  Surgeon: Carlena Bjornstad, MD;  Location: [place];  Service: Cardiovascular;  Laterality: N/A;   CARDIOVERSION N/A 01/16/2014   Procedure: CARDIOVERSION;  Surgeon: Lelon Perla, MD;  Location: [facility] ENDOSCOPY;  Service: Cardiovascular;  Laterality: N/A;   CARDIOVERSION N/A 12/11/2018   Procedure: CARDIOVERSION;  Surgeon: Larey Dresser, MD;  Location: [facility] ENDOSCOPY;  Service: Cardiovascular;  Laterality: N/A;   COLONOSCOPY  2008   CORONARY ARTERY BYPASS GRAFT  1996   with a LIMA to the LAD, SVG to RCA and SVG to OM   CORONARY STENT INTERVENTION N/A 03/26/2019   Procedure: CORONARY STENT INTERVENTION;  Surgeon: Jettie Booze, MD;  Location: [place] [facility];  Service: Cardiovascular;  Laterality: N/A;   CORONARY STENT PLACEMENT  1996   LCX   EMBOLECTOMY Right 05/09/2019   Procedure: Embolectomy of right radial artery;  Surgeon: Serafina Mitchell, MD;  Location: [place];  Service: Vascular;  Laterality: Right;   ESOPHAGEAL DILATION     EYE SURGERY     FALSE ANEURYSM REPAIR Right 05/09/2019   Procedure: REPAIR FALSE ANEURYSM RIGHT RADIAL;  Surgeon: Serafina Mitchell, MD;  Location: [place];  Service: Vascular;  Laterality: Right;   LEFT HEART CATH AND CORS/GRAFTS ANGIOGRAPHY N/A 03/26/2019   Procedure: LEFT HEART CATH AND CORS/GRAFTS ANGIOGRAPHY;  Surgeon: Larey Dresser, MD;  Location: [place] [facility];  Service: Cardiovascular;  Laterality: N/A;   POLYPECTOMY     TONSILLECTOMY     UPPER GASTROINTESTINAL  ENDOSCOPY     esophegeal dilation    Social History   Socioeconomic History   Marital status: Married    Spouse name: Inez Catalina   Number of children: 2   Years of education: Not on file   Highest education level: Not on file  Occupational History   Occupation: Radio broadcast assistant strain: Not hard at all   Food insecurity    Worry: Never true    Inability: Never true   Transportation needs    Medical: No    Non-medical: No  Tobacco Use   Smoking status: Former Smoker    Packs/day: 0.50    Years: 5.00  Pack years: 2.50    Types: Cigarettes    Quit date: 12/27/1966    Years since quitting: 52.8   Smokeless tobacco: Former Systems developer    Quit date: 1950  Substance and Sexual Activity   Alcohol use: No   Drug use: No   Sexual activity: Yes  Lifestyle   Physical activity    Days per week: 0 days    Minutes per session: 0 min   Stress: To some extent  Relationships   Social connections    Talks on phone: Not on file    Gets together: Not on file    Attends religious service: Not on file    Active member of club or organization: Not on file    Attends meetings of clubs or organizations: Not on file    Relationship status: Not on file   Intimate partner violence    Fear of current or ex partner: Not on file    Emotionally abused: Not on file    Physically abused: Not on file    Forced sexual activity: Not on file  Other Topics Concern   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  died at age 45   Colon cancer Neg Hx    Esophageal cancer Neg Hx    Pancreatic cancer Neg Hx    Rectal cancer Neg Hx    Stomach cancer Neg Hx     Prior to Admission medications   Medication Sig Start Date End Date Taking? Authorizing Provider  acetaminophen (TYLENOL) 500 MG tablet Take 500 mg by mouth every 8 (eight) hours as needed for mild pain or moderate pain.    Yes [provider]  albuterol (PROAIR HFA) 108 (90 BASE) MCG/ACT inhaler Inhale 2 puffs into the lungs every 4 (four) hours as needed for wheezing or shortness of breath (and prior to exercise). 06/13/14  Yes Byrum, Rose Fillers, MD  AMBULATORY NON FORMULARY MEDICATION Apply 1 application topically See admin instructions. Testosterone cream 150mg /15% Place 1 ml  onto the skin daily   Yes [provider]  clopidogrel (PLAVIX) 75 MG tablet TAKE 1 TABLET (75 MG TOTAL) BY MOUTH DAILY WITH BREAKFAST. 09/14/19  Yes Clegg, Amy D, NP  dicyclomine (BENTYL) 10 MG capsule Take 10 mg by mouth 3 (three) times daily as needed for spasms.   Yes [provider]  dofetilide (TIKOSYN) 250 MCG capsule TAKE 1 CAPSULE BY MOUTH EVERY 12 HOURS *NEED OFFICE VISIT* Patient taking differently: Take 250 mcg by mouth every 12 (twelve) hours.  12/25/18  Yes Larey Dresser, MD  ezetimibe (ZETIA) 10 MG tablet TAKE 1 TABLET BY MOUTH EVERY DAY Patient taking differently: Take 10 mg by mouth  daily.  05/01/19  Yes Larey Dresser, MD  famotidine (PEPCID) 40 MG tablet Take 1 tablet (40 mg total) by mouth at bedtime. 01/04/19  Yes Ladene Artist, MD  furosemide (LASIX) 40 MG tablet Take 0.5 tablets (20 mg total) by mouth daily. 07/10/19  Yes Larey Dresser, MD  HYDROcodone-acetaminophen (NORCO/VICODIN) 5-325 MG tablet Take 1 tablet by mouth every 6 (six) hours as  needed (For mouth pain).   Yes [provider]  lactobacillus acidophilus & bulgar (LACTINEX) chewable tablet Chew 2 tablets by mouth daily. (0800)   Yes [provider]  loperamide (IMODIUM) 2 MG capsule Take 2 mg by mouth 2 (two) times daily as needed for diarrhea or loose stools. One or two tablets daily as needed   Yes [provider]  LORazepam (ATIVAN) 0.5 MG tablet Take 0.5 mg by mouth at bedtime.    Yes [provider]  magnesium oxide (MAG-OX) 400 MG tablet Take 400 mg by mouth daily.   Yes [provider]  methocarbamol (ROBAXIN) 500 MG tablet Take 500 mg by mouth 2 (two) times daily.    Yes [provider]  nebivolol (BYSTOLIC) 10 MG tablet Take 0.5 tablets (5 mg total) by mouth daily. 06/19/19  Yes Fenton, Clint R, PA  nitroGLYCERIN (NITROSTAT) 0.4 MG SL tablet Place 1 tablet (0.4 mg total) under the tongue every 5 (five) minutes as needed for chest pain. 03/27/19 03/26/20 Yes Clegg, Amy D, NP  potassium chloride SA (K-DUR) 20 MEQ tablet Take 1 tablet (20 mEq total) by mouth at bedtime. 06/25/19  Yes Larey Dresser, MD  Probiotic Product (ALIGN) 4 MG CAPS Take 4 mg by mouth daily.   Yes [provider]  rosuvastatin (CRESTOR) 40 MG tablet TAKE 1 TABLET BY MOUTH EVERY DAY Patient taking differently: Take 40 mg by mouth daily.  05/23/19  Yes Larey Dresser, MD  sodium chloride (OCEAN) 0.65 % SOLN nasal spray Place 1 spray into both nostrils as needed for congestion.   Yes [provider]  traZODone (DESYREL) 50 MG tablet Take 50 mg by mouth at bedtime.   Yes [provider]  Vitamin D, Ergocalciferol, (DRISDOL) 50000 UNITS CAPS capsule Take 50,000 Units by mouth every 14 (fourteen) days. Take on 1st and 15th   Yes [provider]  XARELTO 15 MG TABS tablet TAKE 1 TABLET BY MOUTH EVERY DAY IN THE EVENING Patient taking differently: Take 15 mg by mouth at bedtime.  09/14/19  Yes Larey Dresser, MD     Physical Exam: Vitals:   10/20/19 1245 10/20/19 1440  BP: 133/71   Pulse: 64   Resp: 18   Temp: 98.4 F (36.9 C)   TempSrc: Oral   SpO2: 98%   Weight:  49.9 kg  Height:  5\' 6"  (1.676 m)      General:  Appears calm and comfortable and is NAD; spitting mouthfuls of blood every 1-2 minutes into an emesis bag  Eyes:  PERRL, EOMI, normal lids, iris  ENT:  Hard of hearing, sutures along the left maxilla with bleeding more than simply oozing  Neck:  no LAD, masses or thyromegaly  Cardiovascular:  RRR, no m/r/g. No LE edema.   Respiratory:   CTA bilaterally with no wheezes/rales/rhonchi.  Normal respiratory effort.  Abdomen:  soft, NT, ND, NABS  Skin:  no rash or induration seen on limited exam  Musculoskeletal:  grossly normal tone LUE/BLE, good ROM, no bony abnormality; chronic bicep/deltoid weakness on  the right associated with polio with periodic fasciculations/ballismus of RUE  Psychiatric:  grossly normal mood and affect, speech fluent and appropriate, AOx3  Neurologic:  CN 2-12 grossly intact, moves all extremities in coordinated fashion, sensation intact    Radiological Exams on Admission: Mr Brain Wo Contrast  Result Date: 10/20/2019 CLINICAL DATA:  Right arm weakness EXAM: MRI HEAD WITHOUT CONTRAST TECHNIQUE: Multiplanar, multiecho pulse sequences of the brain and surrounding structures were obtained without intravenous contrast. COMPARISON:  CT head 07/12/2013 FINDINGS: Brain: Restricted diffusion in the left parietal cortex in the region the motor strip compatible with acute infarct. There is diffuse cortical edema. No associated hemorrhage. No other acute infarct. Mild atrophy without hydrocephalus. No significant chronic ischemia. Chronic microhemorrhage left temporoparietal periventricular white matter, separate from the area of acute infarct. Negative for midline shift. Vascular: Normal arterial flow voids. Skull and upper cervical spine: Negative  Sinuses/Orbits: Mild mucosal edema paranasal sinuses. Bilateral cataract surgery Other: Image quality degraded by mild motion IMPRESSION: Acute infarct left parietal cortex involving the motor strip accounting for left arm weakness. No associated hemorrhage. These results will be called to the ordering clinician or representative by the Radiologist Assistant, and communication documented in the PACS or zVision Dashboard. Electronically Signed   By: Franchot Gallo M.D.   On: 10/20/2019 11:01    EKG: Independently reviewed.  NSR with rate 70; nonspecific ST changes with no evidence of acute ischemia   Labs on Admission: I have personally reviewed the available labs and imaging studies at the time of the admission.  Pertinent labs:   Glucose 119 BUN 25/Creatinine 1.71/GFR 37; 22/1.42/46 on 10/15 - variable baseline 1.4-2.0 Bili 1.9 WBC 10.8 INR 1.6   Assessment/Plan Principal Problem:   CVA (cerebral vascular accident) ([place]) Active Problems:   Hyperlipidemia   COPD (chronic obstructive pulmonary disease) (HCC)   A-fib (HCC)   Chronic diastolic (congestive) heart failure (HCC)   OSA (obstructive sleep apnea)   Bleeding gums   CVA -Patient had stops and starts of AC this week due to dental procedure and gum bleeding -Likely had a CVA while off Xarelto Sun-Tuesday, as symptoms started Wednesday -Unfortunately, after starting [facility] he began bleeding (see below) and so Thursday night dose was held with cessation in bleeding and resumption after restarting Friday night -In the meantime, he developed RUE fasciculations/ballismus Wednesday night concerning for CVA -He came to the ER but reported that he could not wait and left without being seen -He saw his PCP on Friday and had an outpatient MRI arranged for today; MRI showed acute stroke and he was instructed to return to the ER for admission -Will admit for further CVA evaluation -Telemetry monitoring -MRA -Carotid dopplers -Echo -Risk  stratification with FLP, A1c; will also check TSH -He was previously on Plavix and Xarelto; due to CKD will transition to Eliquis instead and continue Plavix -Neurology consult -PT/OT/ST/Nutrition Consults  Bleeding gums post dental procedure -Patient with dental extraction Tuesday, off AC from Sun-Tuesday -Resumed [facility] Wednesday night and had gum bleeding -Went back to dentist Thursday with ongoing bleeding; has resuturing and held Xarelto again Thursday night -Resumed Xarelto Friday night and started bleeding again; it has been persistent in the ER -Topical TXA used without relief -Dr. Maryan Rued used oral lidocaine and held pressure with cessation of bleeding -I spoke with Dr. Hardie Shackleton (parter of Dr. Romie Minus) and he will come to the ER to evaluate the patient -He has asked the 2% lidocaine with 1:50,000 parts Epi be available  at the bedside and this has been ordered -As seen above, [facility] is essential for this patient due to CVA that occurred during transitional period -Will recheck CBC in AM  Stage 3b CKD -Variable creatinine between 1.4-2, appears to be at relative baseline at this time -Based on stage 3b CKD, patient likely should transition off Xarelto -Will start Eliquis  CAD/CHF -Continue Tikosyn -Transition off Xarelto to Eliquis -Wife has asked that [name] be notified of admission; will send Secure Chat message and if that is not successful he can be notified on Monday or sooner if needed -Echo on 3/30 with EF 45-50% and indeterminate diastolic function -Appears compensated for now -Will hold lasix for now -s/p ablation in 05/2019, currently in NSR  HTN -He is out of the window for permissive HTN -Continue Bystolic   HLD -Check FLP -Continue Crestor 40 mg daily and also Zetia   OSA -Hold nightly BIPAP for now given dental issues and CVA -Likely ok to resume soon    Note: This patient has been tested and is pending for the novel coronavirus COVID-19.     DVT  prophylaxis:  Eliquis Code Status: DNI - confirmed with patient/family Family Communication: Wife present throughout evaluation Disposition Plan:  Home once clinically improved Consults called: Neurology, Oral surgery; PT/OT/ST/Nutrition  Admission status: Admit - It is my clinical opinion that admission to INPATIENT is reasonable and necessary because of the expectation that this patient will require hospital care that crosses at least 2 midnights to treat this condition based on the medical complexity of the problems presented.  Given the aforementioned information, the predictability of an adverse outcome is felt to be significant.    Karmen Bongo MD [facility]   How to contact the [facility] Attending or Consulting provider [place] or covering provider during after hours [place], for this patient?  1. Check the care team in [facility] and look for a) attending/consulting [facility] provider listed and b) the [facility] team listed 2. Log into [email or web address] and use [place] universal password to access. If you do not have the password, please contact the hospital operator. 3. Locate the [facility] provider you are looking for under [facility] and page to a number that you can be directly reached. 4. If you still have difficulty reaching the provider, please page the [facility] (Director on Call) for the Hospitalists listed on amion for assistance.   10/20/2019, 5:42 PM

## 2019-10-20 NOTE — Progress Notes (Addendum)
During MRA of neck, patient's IV in left arm infiltrated during the injection. 5 mL Gadavist and 10 mL saline were injected.  Patient felt pain and pressure at IV site, ice was applied promptly.  After 10 minutes patient reported the pain and pressure had subsided and the arm felt better.  Per Radiologist Dr Hilma Favors, ok to start a new IV and inject contrast again if ok with patient. Patient agreed to have a new IV placed and contrast injected. Contrast went in with no complications, at end of exam patient stated he no longer needed ice pack, the left arm felt better. RN informed

## 2019-10-20 NOTE — ED Triage Notes (Signed)
Pt sent to ED by PCP after outpatient MRI showed stroke.  Wife reports pt had stroke symptoms that started on Wednesday and seen in ED.  Also reports 2 mouth surgeries this week and pt has recurrent bleeding that started back at 5am.   Reports uncontrolled R arm movements.

## 2019-10-21 ENCOUNTER — Inpatient Hospital Stay (HOSPITAL_COMMUNITY): Payer: Medicare Other

## 2019-10-21 ENCOUNTER — Encounter (HOSPITAL_COMMUNITY): Payer: Medicare Other

## 2019-10-21 DIAGNOSIS — I6389 Other cerebral infarction: Secondary | ICD-10-CM

## 2019-10-21 LAB — COMPREHENSIVE METABOLIC PANEL
ALT: 21 U/L (ref 0–44)
AST: 31 U/L (ref 15–41)
Albumin: 3.4 g/dL — ABNORMAL LOW (ref 3.5–5.0)
Alkaline Phosphatase: 70 U/L (ref 38–126)
Anion gap: 8 (ref 5–15)
BUN: 30 mg/dL — ABNORMAL HIGH (ref 8–23)
CO2: 27 mmol/L (ref 22–32)
Calcium: 9.3 mg/dL (ref 8.9–10.3)
Chloride: 102 mmol/L (ref 98–111)
Creatinine, Ser: 1.59 mg/dL — ABNORMAL HIGH (ref 0.61–1.24)
GFR calc Af Amer: 46 mL/min — ABNORMAL LOW (ref >60)
GFR calc non Af Amer: 40 mL/min — ABNORMAL LOW (ref >60)
Glucose, Bld: 125 mg/dL — ABNORMAL HIGH (ref 70–99)
Potassium: 4.7 mmol/L (ref 3.5–5.1)
Sodium: 137 mmol/L (ref 135–145)
Total Bilirubin: 1.4 mg/dL — ABNORMAL HIGH (ref 0.3–1.2)
Total Protein: 6.1 g/dL — ABNORMAL LOW (ref 6.5–8.1)

## 2019-10-21 LAB — CBC WITH DIFFERENTIAL/PLATELET
Abs Immature Granulocytes: 0.01 10*3/uL (ref 0.00–0.07)
Basophils Absolute: 0.1 10*3/uL (ref 0.0–0.1)
Basophils Relative: 1 %
Eosinophils Absolute: 0 10*3/uL (ref 0.0–0.5)
Eosinophils Relative: 1 %
HCT: 39.9 % (ref 39.0–52.0)
Hemoglobin: 13 g/dL (ref 13.0–17.0)
Immature Granulocytes: 0 %
Lymphocytes Relative: 14 %
Lymphs Abs: 1.1 10*3/uL (ref 0.7–4.0)
MCH: 32.1 pg (ref 26.0–34.0)
MCHC: 32.6 g/dL (ref 30.0–36.0)
MCV: 98.5 fL (ref 80.0–100.0)
Monocytes Absolute: 1 10*3/uL (ref 0.1–1.0)
Monocytes Relative: 13 %
Neutro Abs: 5.8 10*3/uL (ref 1.7–7.7)
Neutrophils Relative %: 71 %
Platelets: 171 10*3/uL (ref 150–400)
RBC: 4.05 MIL/uL — ABNORMAL LOW (ref 4.22–5.81)
RDW: 12.8 % (ref 11.5–15.5)
WBC: 8.1 10*3/uL (ref 4.0–10.5)
nRBC: 0 % (ref 0.0–0.2)

## 2019-10-21 LAB — LIPID PANEL
Cholesterol: 108 mg/dL (ref 0–200)
HDL: 36 mg/dL — ABNORMAL LOW (ref >40)
LDL Cholesterol: 59 mg/dL (ref 0–99)
Total CHOL/HDL Ratio: 3 RATIO
Triglycerides: 65 mg/dL (ref <150)
VLDL: 13 mg/dL (ref 0–40)

## 2019-10-21 LAB — HEMOGLOBIN A1C
Hgb A1c MFr Bld: 6.2 % — ABNORMAL HIGH (ref 4.8–5.6)
Mean Plasma Glucose: 131.24 mg/dL

## 2019-10-21 LAB — ECHOCARDIOGRAM COMPLETE
Height: 66 in
Weight: 1760 oz

## 2019-10-21 LAB — SARS CORONAVIRUS 2 (TAT 6-24 HRS): SARS Coronavirus 2: NEGATIVE

## 2019-10-21 LAB — TSH: TSH: 1.123 u[IU]/mL (ref 0.350–4.500)

## 2019-10-21 MED ORDER — NEBIVOLOL HCL 2.5 MG PO TABS
2.5000 mg | ORAL_TABLET | Freq: Every day | ORAL | Status: DC
  Administered 2019-10-21 – 2019-10-22 (×2): 2.5 mg via ORAL
  Filled 2019-10-21 (×2): qty 1

## 2019-10-21 MED ORDER — SODIUM CHLORIDE 0.9 % IV SOLN
INTRAVENOUS | Status: AC
  Administered 2019-10-21 (×2): via INTRAVENOUS

## 2019-10-21 NOTE — ED Notes (Signed)
Ordered breakfast--[name] 

## 2019-10-21 NOTE — ED Notes (Signed)
Heart healthy lunch tray ordered 

## 2019-10-21 NOTE — Progress Notes (Signed)
CSW acknowledges consult for SNF. CSW is awaiting PT/OT evaluations. CSW will base disposition planning off PT/OT recommendations.   CSW will continue to follow.   Domenic Schwab, MSW, [place] Worker [facility]  [phone]

## 2019-10-21 NOTE — ED Notes (Signed)
Neurologist at bedside. 

## 2019-10-21 NOTE — Discharge Instructions (Addendum)

## 2019-10-21 NOTE — Progress Notes (Signed)
STROKE TEAM PROGRESS NOTE   HISTORY OF PRESENT ILLNESS (per record) [name] is an 83 y.o. 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 Fib (s/p ablation x 2 on Xarelto), CAD with multiple stents on Plavix, MI, HTN, HLD   Patient was taken off Xarelto 3 days prior to a tooth extraction. Per patient and wife on Wednesday evening after his tooth extraction patient had a sudden onset of right hand uncontrollable flinging movements, with description being most consistent with hemiballismus. He also experienced numbness of the right side of his face. At that time he presented to the ED saying he was having a stroke but after evaluation there was told that it was not a stroke and was sent home. Patient had been off Xarelto for tooth extraction Sunday, Monday and Tuesday. Started Xarelto back on Wednesday night. Tooth extraction began bleeding and he went to the doctors office. Bleeding stopped spontaneously, but physician said that his arm movements were troubling and that he needed an MRI. Due to the bleeding, Xarelto was stopped for another day and then restarted. Patient had MRI Saturday at [facility]. The MRI showed an acute infarct of the left parietal cortex involving the motor strip. Patient then was sent to the ED. He denied any vision changes, CP, SOB or any numbness and tingling today. He denies smoking, EtOH or drug abuse. ED course:  BP: 178/54   BG: 119 Date last known well: 10/17/2019 tPA Given: No. Outside of window. On xarelto Modified Rankin: Rankin Score=0 NIHSS: 0   INTERVAL HISTORY Complains of mild right arm tingling and some involuntary movements of right arm, but not as severe as before.   MRI Brain - left parietal primary sensory cortex acute infarct. MRA Neck - left ICA stenosis LDL 59, TG 65, A1c 6.2, Cr 1.59    OBJECTIVE Vitals:   10/21/19 0706 10/21/19 0800 10/21/19 0900 10/21/19  0945  BP: (!) 101/56 (!) 152/60 (!) 124/94 102/61  Pulse: 86 83  83  Resp: 16 19 20  (!) 24  Temp:      TempSrc:      SpO2: 97% 96%  97%  Weight:      Height:        CBC:  Recent Labs  Lab 10/20/19 1304 10/21/19 0959  WBC 10.8* 8.1  NEUTROABS 8.5* 5.8  HGB 15.9 13.0  HCT 49.1 39.9  MCV 98.0 98.5  PLT 217 [id number]    Basic Metabolic Panel:  Recent Labs  Lab 10/20/19 1304  NA 138  K 5.1  CL 101  CO2 24  GLUCOSE 119*  BUN 25*  CREATININE 1.71*  CALCIUM 10.6*    Lipid Panel:     Component Value Date/Time   CHOL 108 10/21/2019 0441   TRIG 65 10/21/2019 0441   HDL 36 (L) 10/21/2019 0441   CHOLHDL 3.0 10/21/2019 0441   VLDL 13 10/21/2019 0441   LDLCALC 59 10/21/2019 0441   HgbA1c:  Lab Results  Component Value Date   HGBA1C 6.2 (H) 10/21/2019   Urine Drug Screen: No results found for: LABOPIA, COCAINSCRNUR, LABBENZ, AMPHETMU, THCU, LABBARB  Alcohol Level No results found for: [facility]  IMAGING  Mr Angio Neck W Wo Contrast 10/20/2019 IMPRESSION:  Bilateral common and internal carotid arteries patent within the neck. Atherosclerotic irregularity of the distal left common carotid artery with less than 50% stenosis. Mild focal atherosclerotic narrowing is also present more proximally within the  mid to distal left common carotid artery. Bilateral vertebral arteries patent within the neck without significant stenosis.   Mr Brain Wo Contrast 10/20/2019 IMPRESSION:  Acute infarct left parietal cortex involving the motor strip accounting for left arm weakness. No associated hemorrhage.     Transthoracic Echocardiogram  00/00/2020 Pending   Bilateral Carotid Dopplers  00/00/2020 Pending   ECG - SR rate 70 BPM. (See cardiology reading for complete details)    PHYSICAL EXAM Blood pressure 102/61, pulse 83, temperature 98.4 F (36.9 C), temperature source Oral, resp. rate (!) 24, height 5\' 6"  ([id number] m), weight 49.9 kg, SpO2 97 %.   Awake, alert, fully  oriented. Face symmetrical.  Tongue midline. EOMI, PERL. 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  Reduced pinprick on the right arms vs left, but intact in face and leg. Intermittent myoclonic jerks of the right arm.  No rhythmic sustained activity. Coord- intact. Sensory ataxia with right FTN when eyes closed.         ASSESSMENT/PLAN [name] is a 83 y.o. 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 Fib (s/p ablation x 2) on Xarelto - held 3 days for tooth extraction, CAD with multiple stents on Plavix, MI, CHF, COPD, HTN, and HLD presenting with sudden onset of right hand uncontrollable flinging movements, with description being most consistent with hemiballismus. He also experienced numbness of the right side of his face. He did not receive IV t-PA due to Xarelto therapy and minimal deficits.  Stroke: Acute infarct left parietal cortex involving the motor strip   Resultant  Right arm numbness and sensory ataxia.   Code Stroke CT Head - not ordered  CT head - not ordered  MRI head - Acute infarct left parietal cortex involving the motor strip   MRA Neck -  Atherosclerotic irregularity of the distal left common carotid artery with less than 50% stenosis.  CTA H&N - not ordered  CT Perfusion - not ordered  Carotid Doppler - pending  2D Echo - pending  Sars Corona Virus 2 - negative  LDL - 59  HgbA1c - 6.2  UDS - not ordered  VTE prophylaxis - Eliquis Diet  Diet Order            Diet Heart Fluid consistency: Thin  Diet effective ____              clopidogrel 75 mg daily and Xarelto (rivaroxaban) daily prior to admission, now on clopidogrel 75 mg daily and Eliquis (apixaban) daily  Patient counseled to be compliant with his antithrombotic medications  Ongoing aggressive stroke risk factor  management  Therapy recommendations:  pending  Disposition:  Pending  Hypertension  Home BP meds: Bystolic  Current BP meds: Bystolic  Blood pressure somewhat low at times  . Permissive hypertension (OK if < 220/120) but gradually normalize in 5-7 days  . Long-term BP goal normotensive  Hyperlipidemia  Home Lipid lowering medication: Crestor 40 mg daily and Zetia 10 mg daily  LDL 59, goal < 70  Current lipid lowering medication: Crestor 40 mg daily and Zetia 10 mg daily  Continue statin at discharge  Other Stroke Risk Factors  Advanced age  Former cigarette smoker - quit  Hx stroke/TIA  Coronary artery disease  Obstructive sleep apnea, on BiPAP at home  Congestive Heart Failure  Atrial fibrillation  Other Active Problems  IV infiltrated during MRA  Creatinine - 1.71  Hospital day # 1  Acute left parietal infarct,likely cardioembolic off Xarelto due to tooth extraction.  It is causing a sensory ataxia, sensory loss, and likely involuntary jerks due to sensory feedback impaired.  It does not appear to be a focal motor seizure but he would be at risk of that.  EEG is pending.  We can consider Keppra if movements continue and patient feels need to stop them, but I expect it to naturally go away with some time.  He has been switched to Xarelto now which is a bit more efficacious for cardioembolism prevention. He is also on Plavix for 7 coronary stents.  He is aware of risk of significant hemorrhage on combination of Xarelto and Plavix,but risk benefit favors continuing it for now.  Cholesterol is well controlled, as is glucose.  BP is well controlled.    Rogue Jury, MS, MD  To contact Stroke Continuity provider, please refer to [email or web address] After hours, contact General Neurology

## 2019-10-21 NOTE — Progress Notes (Signed)
  Echocardiogram 2D Echocardiogram has been performed.  [name] 10/21/2019, 5:43 PM

## 2019-10-21 NOTE — Progress Notes (Signed)
Hospitalist progress note  If 7PM-7AM,  night-coverage-look on AMION -prefer pages-not epic alaster, gasque  [id number] DOB: August 05, 1936 DOA: 10/20/2019 PCP: Burnard Bunting, MD  Narrative:  83 year old male CAD CABG 1996-intervention PCI 2009 HFpEF 55-60% persistent A. fib since 2014 status post ablation [facility] 2015/Tikosyn prior stroke 1996 post cardiac cath second-degree AV block OSA COPD CKD 3 post polio syndrome elevated left hemidiaphragm reflux with stricture in 2012 Admitted sudden onset Hemiballism CVA in the setting of held anticoagulation because of gum bleeding in the outpatient setting post procedure-hold further bleeding-came back to emergency room after an MRI showed stroke  Assessment & Plan: Stroke Continue Eliquis instead of Xarelto and Plavix Complete work-up with echocardiogram, obtain input from therapy specialists regarding disposition Follow EEG given diagnostic uncertainty as per neurology and await stroke treatment Gum bleeding Seen by dentist Dr. Hardie Shackleton 10/24 injected epi and lidocaine into the mouth bleeding is stopped-can continue diet from my perspective-recheck labs If any further bleeding dental office to be consulted although it appears patient declines further procedures unless under anesthesia Leukocytosis Probably secondary to multiple dental procedures would hold on any intervention at this time trend the labs monitor for fever has the same would consider antibiotics CAD 1996+ PCI 2009 Continuing Plavix 75, Bystolic 5 mg changed to [id number] slowly beta-blocker over the next several days Permanent A. fib status post ablation/Tikosyn continue Tikosyn 250 every 12 check a.m. magnesium on Eliquis 2.5 twice daily CKD 3 Monitor trends, holding Lasix 20 daily currently on IV fluids 75 cc/h time limit until 10/27 Reflux Continue Pepcid 20 at bedtime Hyperlipidemia Continue Zetia 10-Crestor 40-LDL 59 Depression Recommendations: Trazodone 50  daily Impaired glucose tolerance A1c 6.2 Monitor trends but no medication at this time   DVT prophylaxis: On Eliquis  Code Status:   Full code   Family Communication:   None Disposition Plan: Inpatient pending resolution  Consultants:   Neurology Procedures:   No Antimicrobials:   N0 Subjective: Awake alert coherent no further bleeding from left upper gums no chest pain does feel somewhat weaker in the right arm difficulty extending the arm however overall otherwise is fine Objective: Vitals:   10/21/19 0500 10/21/19 0706 10/21/19 0800 10/21/19 0900  BP: 127/62 (!) 101/56 (!) 152/60 (!) 124/94  Pulse: 81 86 83   Resp: 19 16 19 20   Temp:      TempSrc:      SpO2: 95% 97% 96%   Weight:      Height:       No intake or output data in the 24 hours ending 10/21/19 0933 Filed Weights   10/20/19 1440  Weight: 49.9 kg    Examination: EOMI NCAT pupils equally reactive no visual field cuts Finger-nose-finger is altered on the right side with little bit of past-pointing Chest is clear no added sound no rales no rhonchi Abdomen soft nontender no rebound no guarding can raise lower extremities against resistance fairly well Sensory is grossly intact  Data Reviewed: I have personally reviewed following labs and imaging studies CBC: Recent Labs  Lab 10/20/19 1304  WBC 10.8*  NEUTROABS 8.5*  HGB 15.9  HCT 49.1  MCV 98.0  PLT [id number]   Basic Metabolic Panel: Recent Labs  Lab 10/20/19 1304  NA 138  K 5.1  CL 101  CO2 24  GLUCOSE 119*  BUN 25*  CREATININE 1.71*  CALCIUM 10.6*   GFR: Estimated Creatinine Clearance: 23.9 mL/min (A) (by C-G formula based on SCr of 1.71 mg/dL (H)).  Liver Function Tests: Recent Labs  Lab 10/20/19 1304  AST 37  ALT 28  ALKPHOS 91  BILITOT 1.9*  PROT 7.9  ALBUMIN 4.3   No results for input(s): LIPASE, AMYLASE in the last 168 hours. No results for input(s): AMMONIA in the last 168 hours. Coagulation Profile: Recent Labs  Lab  10/20/19 1304  INR 1.6*   Cardiac Enzymes: Radiology Studies: Reviewed images personally in health database  Scheduled Meds: .  stroke: mapping our early stages of recovery book   Does not apply Once  . apixaban  2.5 mg Oral BID  . clopidogrel  75 mg Oral Q breakfast  . dofetilide  250 mcg Oral Q12H  . ezetimibe  10 mg Oral Daily  . famotidine  20 mg Oral QHS  . LORazepam  0.5 mg Oral QHS  . magnesium oxide  400 mg Oral Daily  . methocarbamol  500 mg Oral BID  . nebivolol  5 mg Oral Daily  . rosuvastatin  40 mg Oral Daily  . traZODone  50 mg Oral QHS   Continuous Infusions: . sodium chloride 75 mL/hr at 10/21/19 0221    LOS: 1 day   Time spent: [place], MD [facility]  10/21/2019, 9:33 AM

## 2019-10-22 ENCOUNTER — Inpatient Hospital Stay (HOSPITAL_COMMUNITY): Payer: Medicare Other

## 2019-10-22 DIAGNOSIS — R259 Unspecified abnormal involuntary movements: Secondary | ICD-10-CM

## 2019-10-22 DIAGNOSIS — I639 Cerebral infarction, unspecified: Secondary | ICD-10-CM

## 2019-10-22 LAB — CBC WITH DIFFERENTIAL/PLATELET
Abs Immature Granulocytes: 0.02 10*3/uL (ref 0.00–0.07)
Basophils Absolute: 0.1 10*3/uL (ref 0.0–0.1)
Basophils Relative: 1 %
Eosinophils Absolute: 0.1 10*3/uL (ref 0.0–0.5)
Eosinophils Relative: 1 %
HCT: 38.2 % — ABNORMAL LOW (ref 39.0–52.0)
Hemoglobin: 12.6 g/dL — ABNORMAL LOW (ref 13.0–17.0)
Immature Granulocytes: 0 %
Lymphocytes Relative: 22 %
Lymphs Abs: 2.1 10*3/uL (ref 0.7–4.0)
MCH: 32.1 pg (ref 26.0–34.0)
MCHC: 33 g/dL (ref 30.0–36.0)
MCV: 97.2 fL (ref 80.0–100.0)
Monocytes Absolute: 1.1 10*3/uL — ABNORMAL HIGH (ref 0.1–1.0)
Monocytes Relative: 12 %
Neutro Abs: 6 10*3/uL (ref 1.7–7.7)
Neutrophils Relative %: 64 %
Platelets: 179 10*3/uL (ref 150–400)
RBC: 3.93 MIL/uL — ABNORMAL LOW (ref 4.22–5.81)
RDW: 12.7 % (ref 11.5–15.5)
WBC: 9.3 10*3/uL (ref 4.0–10.5)
nRBC: 0 % (ref 0.0–0.2)

## 2019-10-22 LAB — BASIC METABOLIC PANEL
Anion gap: 11 (ref 5–15)
BUN: 26 mg/dL — ABNORMAL HIGH (ref 8–23)
CO2: 23 mmol/L (ref 22–32)
Calcium: 8.9 mg/dL (ref 8.9–10.3)
Chloride: 103 mmol/L (ref 98–111)
Creatinine, Ser: 1.35 mg/dL — ABNORMAL HIGH (ref 0.61–1.24)
GFR calc Af Amer: 57 mL/min — ABNORMAL LOW (ref >60)
GFR calc non Af Amer: 49 mL/min — ABNORMAL LOW (ref >60)
Glucose, Bld: 103 mg/dL — ABNORMAL HIGH (ref 70–99)
Potassium: 3.9 mmol/L (ref 3.5–5.1)
Sodium: 137 mmol/L (ref 135–145)

## 2019-10-22 MED ORDER — ENSURE ENLIVE PO LIQD
237.0000 mL | Freq: Two times a day (BID) | ORAL | Status: DC
  Administered 2019-10-22: 237 mL via ORAL

## 2019-10-22 MED ORDER — "THROMBI-PAD 3""X3"" EX PADS"
1.0000 | MEDICATED_PAD | Freq: Once | CUTANEOUS | Status: AC
  Administered 2019-10-22: 1 via TOPICAL
  Filled 2019-10-22: qty 1

## 2019-10-22 MED ORDER — APIXABAN 2.5 MG PO TABS: 2.5000 mg | ORAL_TABLET | Freq: Two times a day (BID) | ORAL | 0 refills | Status: DC

## 2019-10-22 NOTE — Progress Notes (Signed)
Patient discharged home with outpatient occupational therapy. Education and information given to patient. OV removed. CCMD notified. All belongings with patient. Leaving unit via wheelchair.

## 2019-10-22 NOTE — Procedures (Signed)
Patient Name: RONDLE SEHGAL  MRN: [id number]  Epilepsy Attending: Lora Havens  Referring Physician/Provider: Dr. Kerney Elbe Date: 10/22/2019 Duration: 25.14 minutes  Patient history: 83 year old male who presented with sudden onset of right hand uncontrollable flingingmovements consistent with hemiballismus and numbness on the right side of his face.  EEG to evaluate for seizure.  Level of alertness: Awake, drowsy  AEDs during EEG study: Lorazepam  Technical aspects: This EEG study was done with scalp electrodes positioned according to the 10-20 International system of electrode placement. Electrical activity was acquired at a sampling rate of 500Hz  and reviewed with a high frequency filter of 70Hz  and a low frequency filter of 1Hz . EEG data were recorded continuously and digitally stored.   Description: The posterior dominant rhythm consists of 9-10 Hz activity of moderate voltage (25-35 uV) seen predominantly in posterior head regions, symmetric and reactive to eye opening and eye closing.  Drowsiness was characterized by attenuation of the posterior background rhythm. There was intermittent 2 to 3 Hz delta slowing in left frontal region.  Physiologic photic driving was not seen during photic stimulation.  Hyperventilation was not performed.  Abnormality -Intermittent slow, left frontal  IMPRESSION: This study is suggestive of cortical dysfunction left frontal region, nonspecific etiology. No seizures or epileptiform discharges were seen throughout the recording.  Rosalia Mcavoy Barbra Sarks

## 2019-10-22 NOTE — Evaluation (Signed)
Speech Language Pathology Evaluation Patient Details Name: [name] MRN: [id number] DOB: 04/08/1936 Today's Date: 10/22/2019 Time: 1050-1110 SLP Time Calculation (min) (ACUTE ONLY): 20 min  Problem List:  Patient Active Problem List   Diagnosis Date Noted  . CVA (cerebral vascular accident) ([place]) 10/20/2019  . Bleeding gums 10/20/2019  . Unstable angina ([place]) 03/25/2019  . OSA (obstructive sleep apnea) 10/16/2018  . Chronic diastolic (congestive) heart failure ([place]) 05/20/2017  . Allergic rhinitis 11/17/2015  . Acute on chronic diastolic CHF (congestive heart failure), NYHA class 4 ([place]) 01/28/2015  . Chest pain on exertion 11/14/2014  . Cerebral artery occlusion 06/24/2014  . Acid reflux 06/24/2014  . H/O cardiovascular disorder 06/24/2014  . H/O acute poliomyelitis 06/24/2014  . Extrasystole 06/24/2014  . A-fib ([place]) 06/24/2014  . Cerebral artery occlusion with cerebral infarction ([place]) 06/24/2014  . COPD (chronic obstructive pulmonary disease) ([place]) 05/02/2014  . CAFL (chronic airflow limitation) ([place]) 05/02/2014  . Atrial fibrillation ([place]) 11/11/2013  . CAD (coronary artery disease) 09/01/2011  . Hyperlipidemia 09/01/2011  . Atherosclerosis of coronary artery 09/01/2011  . History of Raynaud's syndrome   . History of post-polio syndrome   . GERD (gastroesophageal reflux disease)   . PVC (premature ventricular contraction)   . Brainstem stroke ([place])   . Scoliosis    Past Medical History:  Past Medical History:  Diagnosis Date  . Abnormal nuclear cardiac imaging test March 2011   Has positive EKG response, no perfusion defect and normal EF  . Adenomatous colon polyp 12/2002  . Arrhythmia   . Atrial fibrillation ([place])    a. s/p rfca;  b. chronic tikosyn and xarelto.  . Brainstem stroke ([place]) 1996   with residual horner syndrome; ocassional difficuty swalowing  . Cataract    bil cataracts removed  . Chronic diastolic CHF (congestive heart failure) ([place])    a.  04/2017 Echo: EF 65-70%, restrictive physiology, mildly dil LA.  [name] Complication of anesthesia   . COPD (chronic obstructive pulmonary disease) ([place])   . Coronary artery disease    First MI in 1988. PCI in 1996 with subsequent CABG in 1996 due to restenosis. S/P stents to LCX in 2009. Noted to have residual disease in the LAD in a diffuse manner and atretic LIMA graft. He is managed medically.   . Difficult intubation    06/08/19 update - Intubated with MAC3 with grade IIb view 7.0 tube passed easily  . Dysrhythmia    Afib  . Esophageal stricture   . GERD (gastroesophageal reflux disease)   . 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 History of Raynaud's syndrome   . Hyperlipidemia   . Hypertension   . Internal hemorrhoids   . Muscle spasm   . Myocardial infarction [facility])    MI x1 48 - 49 age  . Neuromuscular disorder ([place])    Polio - age 41  . OSA (obstructive sleep apnea) 10/16/2018   Severe OSA with AHI 37.2/hr on BiPAP  . Persistent atrial fibrillation ([place])   . Polio    age 42  . PVC (premature ventricular contraction)   . Scoliosis    mild   Past Surgical History:  Past Surgical History:  Procedure Laterality Date  . ABLATION     done for a fib  . APPENDECTOMY     Age 79  . ATRIAL FIBRILLATION ABLATION N/A 06/08/2019   Procedure: ATRIAL FIBRILLATION ABLATION;  Surgeon: Constance Haw, MD;  Location: [place] CV LAB;  Service: Cardiovascular;  Laterality: N/A;  . CARDIAC CATHETERIZATION    . CARDIOVERSION N/A 08/22/2013   Procedure: CARDIOVERSION;  Surgeon: Carlena Bjornstad, MD;  Location: [facility] ENDOSCOPY;  Service: Cardiovascular;  Laterality: N/A;  . CARDIOVERSION N/A 01/16/2014   Procedure: CARDIOVERSION;  Surgeon: Lelon Perla, MD;  Location: [facility] ENDOSCOPY;  Service: Cardiovascular;  Laterality: N/A;  . CARDIOVERSION N/A 12/11/2018   Procedure: CARDIOVERSION;  Surgeon: Larey Dresser, MD;  Location: [facility] ENDOSCOPY;  Service: Cardiovascular;  Laterality: N/A;   . COLONOSCOPY  2008  . CORONARY ARTERY BYPASS GRAFT  1996   with a LIMA to the LAD, SVG to RCA and SVG to OM  . CORONARY STENT INTERVENTION N/A 03/26/2019   Procedure: CORONARY STENT INTERVENTION;  Surgeon: Jettie Booze, MD;  Location: [place] [facility];  Service: Cardiovascular;  Laterality: N/A;  . CORONARY STENT PLACEMENT  1996   LCX  . EMBOLECTOMY Right 05/09/2019   Procedure: Embolectomy of right radial artery;  Surgeon: Serafina Mitchell, MD;  Location: [place];  Service: Vascular;  Laterality: Right;  . ESOPHAGEAL DILATION    . EYE SURGERY    . FALSE ANEURYSM REPAIR Right 05/09/2019   Procedure: REPAIR FALSE ANEURYSM RIGHT RADIAL;  Surgeon: Serafina Mitchell, MD;  Location: [place];  Service: Vascular;  Laterality: Right;  . LEFT HEART CATH AND CORS/GRAFTS ANGIOGRAPHY N/A 03/26/2019   Procedure: LEFT HEART CATH AND CORS/GRAFTS ANGIOGRAPHY;  Surgeon: Larey Dresser, MD;  Location: [place] [facility] LAB;  Service: Cardiovascular;  Laterality: N/A;  . POLYPECTOMY    . TONSILLECTOMY    . UPPER GASTROINTESTINAL ENDOSCOPY     esophegeal dilation   HPI:  83yo male PMH: CAD, CABG 1996, PCI 2009 HFpEF 55-60%, persistent Afib since 2014, s/p ablation [facility] 2015/Tikosyn, CVA 1996, s/p cardiac cath, second-degree AV block, OSA, COPD, CKD3, post polio syndrome, elevated left hemidiaphragm reflux with stricture in 2012. MRI = Acute infarct left parietal cortex involving the motor stripaccounting for left arm weakness. No associated hemorrhage.   Assessment / Plan / Recommendation Clinical Impression  Pt presents with baseline speech, language and cognition. No further ST intervention recommended at this time. Pt was encouraged to notify PCP if difficulties arise once he returns to normal routinues. Please reconsult if needs arise.    SLP Assessment  SLP Recommendation/Assessment: Patient does not need any further Speech Language Pathology Services SLP Visit Diagnosis: Cognitive communication  deficit (R41.841)    Follow Up Recommendations  None       SLP Evaluation Cognition  Overall Cognitive Status: Within Functional Limits for tasks assessed Arousal/Alertness: Awake/alert Orientation Level: Oriented X4 Attention: Focused;Sustained Focused Attention: Appears intact Sustained Attention: Appears intact Memory: Appears intact Memory Recall Sock: Without Cue Memory Recall Blue: Without Cue Memory Recall Bed: Without Cue Awareness: Appears intact Problem Solving: Appears intact Safety/Judgment: Appears intact       Comprehension  Auditory Comprehension Overall Auditory Comprehension: Appears within functional limits for tasks assessed    Expression Expression Primary Mode of Expression: Verbal Verbal Expression Overall Verbal Expression: Appears within functional limits for tasks assessed Written Expression Dominant Hand: Right Written Expression: Unable to assess (comment)(RUE weakness)   Oral / Motor  Oral Motor/Sensory Function Overall Oral Motor/Sensory Function: Within functional limits Motor Speech Overall Motor Speech: Appears within functional limits for tasks assessed   GO          Lyndon Chenoweth B. Quentin Ore, [facility], [place] Speech Language Pathologist Office: [phone] Pager: [phone]  Shonna Chock 10/22/2019, 11:14 AM

## 2019-10-22 NOTE — Progress Notes (Signed)
Carotid artery duplex and TCD's have been completed. Preliminary results can be found in CV Proc through chart review.   10/22/19 10:24 AM [name] RVT

## 2019-10-22 NOTE — Evaluation (Signed)
Physical Therapy Evaluation and DISCHARGE Patient Details Name: [name] MRN: [id number] DOB: 06-19-1936 Today's Date: 10/22/2019   History of Present Illness  [name] is an 83 y.o. 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 Fib (s/p ablation x 2 on Xarelto), CAD with multiple stents on Plavix, MI, HTN, HLD. Was taken off Xerelto 3 days prior to tooth extraction, presented with sudden onset of right hand uncontrollable movements consistent with hemiballismus and numbness on the right side of his face.  EEG suggestive of cortical dysfunction left frontal region, nonspecific etiology. No seizures or epileptiform discharges were seen throughout the recording.  Clinical Impression  Pt admitted with above. Pt functioning at baseline with exception of mild co-ordination deficits of R UE. Pt with no further acute PT needs at this time. Pt 23/24 on DGI indicating minimal falls risk. Acute PT signing OFF. Please re-consult if needed in future.    Follow Up Recommendations No PT follow up    Equipment Recommendations  None recommended by PT    Recommendations for Other Services       Precautions / Restrictions Precautions Precautions: Fall Restrictions Weight Bearing Restrictions: No      Mobility  Bed Mobility Overal bed mobility: Modified Independent                Transfers Overall transfer level: Needs assistance Equipment used: None Transfers: Sit to/from Stand Sit to Stand: Modified independent (Device/Increase time)         General transfer comment: no difficulty  Ambulation/Gait Ambulation/Gait assistance: Independent Gait Distance (Feet): 500 Feet Assistive device: None Gait Pattern/deviations: WFL(Within Functional Limits) Gait velocity: slow Gait velocity interpretation: >2.62 ft/sec, indicative of community ambulatory General Gait Details: no LOB, steady, able to navigate  back to room  Stairs Stairs: Yes Stairs assistance: Supervision Stair Management: One rail Right Number of Stairs: 12 General stair comments: wfl, ascending/descended reciprocally  Wheelchair Mobility    Modified Rankin (Stroke Patients Only) Modified Rankin (Stroke Patients Only) Pre-Morbid Rankin Score: No symptoms Modified Rankin: No significant disability     Balance Overall balance assessment: Mild deficits observed, not formally tested                               Standardized Balance Assessment Standardized Balance Assessment : Dynamic Gait Index   Dynamic Gait Index Level Surface: Normal Change in Gait Speed: Normal Gait with Horizontal Head Turns: Normal Gait with Vertical Head Turns: Normal Gait and Pivot Turn: Normal Step Over Obstacle: Normal Step Around Obstacles: Normal Steps: Mild Impairment Total Score: 23       Pertinent Vitals/Pain Pain Assessment: No/denies pain    Home Living Family/patient expects to be discharged to:: Private residence Living Arrangements: Spouse/significant other Available Help at Discharge: Family Type of Home: House Home Access: Stairs to enter   Technical brewer of Steps: 1 Home Layout: Two level;Able to live on main level with bedroom/bathroom Home Equipment: Bedside commode      Prior Function Level of Independence: Independent         Comments: pt is currently working, he is a Astronomer   Dominant Hand: Right    Extremity/Trunk Assessment   Upper Extremity Assessment Upper Extremity Assessment: RUE deficits/detail RUE Deficits / Details: pt used R UE to don socks and wash face with mild dificulty RUE Sensation: decreased  light touch RUE Coordination: decreased fine motor;decreased gross motor    Lower Extremity Assessment Lower Extremity Assessment: Overall WFL for tasks assessed    Cervical / Trunk Assessment Cervical / Trunk Assessment: Normal   Communication   Communication: No difficulties  Cognition Arousal/Alertness: Awake/alert Behavior During Therapy: WFL for tasks assessed/performed Overall Cognitive Status: Within Functional Limits for tasks assessed                                        General Comments General comments (skin integrity, edema, etc.): VSS    Exercises Hand Activities Writing Skills: Right;Seated Writing Skills Limitations: required increased time and effort Open and Close Containers: Both;5 reps Proprioception - Hold Cup and Walk: Both Proprioception - Hold Cup and Walk Limitations: typed while standing;R hand overshot 1x during typing;required increased time and effort made multiple mistakes when typing    Assessment/Plan    PT Assessment Patent does not need any further PT services  PT Problem List         PT Treatment Interventions      PT Goals (Current goals can be found in the Care Plan section)  Acute Rehab PT Goals Patient Stated Goal: 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 PT Goal Formulation: All assessment and education complete, DC therapy    Frequency     Barriers to discharge        Co-evaluation               AM-PAC PT "6 Clicks" Mobility  Outcome Measure Help needed turning from your back to your side while in a flat bed without using bedrails?: None Help needed moving from lying on your back to sitting on the side of a flat bed without using bedrails?: None Help needed moving to and from a bed to a chair (including a wheelchair)?: None Help needed standing up from a chair using your arms (e.g., wheelchair or bedside chair)?: None Help needed to walk in hospital room?: None Help needed climbing 3-5 steps with a railing? : None 6 Click Score: 24    End of Session Equipment Utilized During Treatment: Gait belt Activity Tolerance: Patient tolerated treatment well Patient left: (standing at sink bathing with wife assisting) Nurse  Communication: Mobility status PT Visit Diagnosis: Unsteadiness on feet (R26.81)    Time: 1249-1310 PT Time Calculation (min) (ACUTE ONLY): 21 min   Charges:   PT Evaluation $PT Eval Low Complexity: 1 Low          Kittie Plater, PT, DPT Acute Rehabilitation Services Pager #: [phone] Office #: [phone]   Berline Lopes 10/22/2019, 1:45 PM

## 2019-10-22 NOTE — TOC Transition Note (Signed)
Transition of Care [facility]) - CM/SW Discharge Note   Patient Details  Name: [name] MRN: [id number] Date of Birth: 04/09/1936  Transition of Care [facility] CM/SW Contact:  Pollie Friar, RN Phone Number: 10/22/2019, 3:26 PM   Clinical Narrative:    Pt is discharging home with his spouse. CM consulted for outpatient OT. CM met with the patient and his spouse and they are interested in attending [facility]. Orders in Epic and information on the AVS. Pt provided 30 day free card for Eliquis.  Pt has transportation home.    Final next level of care: OP Rehab Barriers to Discharge: No Barriers Identified   Patient Goals and CMS Choice     Choice offered to / list presented to : Patient  Discharge Placement                       Discharge Plan and Services                                     Social Determinants of Health (SDOH) Interventions     Readmission Risk Interventions No flowsheet data found.

## 2019-10-22 NOTE — Discharge Summary (Signed)
Physician Discharge Summary  [name] [id number] DOB: 12-18-1936 DOA: 10/20/2019  PCP: Burnard Bunting, MD  Admit date: 10/20/2019 Discharge date: 10/22/2019  Time spent: 40 minutes  Recommendations for Outpatient Follow-up:  1. Patient needs close follow-up with his dentist to ensure no further bleeding and if so that other procedures to be performed in his office 2. Continue Eliquis instead of Xarelto in addition to Plavix 3. Consider glycemic control in the outpatient setting and check A1c in about 3 months 4. No other specific changes to medications needs outpatient neurology follow-up  Discharge Diagnoses:  Principal Problem:   CVA (cerebral vascular accident) ([place]) Active Problems:   Hyperlipidemia   COPD (chronic obstructive pulmonary disease) ([place])   Atherosclerosis of coronary artery   A-fib (HCC)   Chronic diastolic (congestive) heart failure (HCC)   OSA (obstructive sleep apnea)   Bleeding gums   Discharge Condition: Improved  Diet recommendation: Heart healthy  Filed Weights   10/20/19 1440 10/21/19 2200  Weight: 49.9 kg 54.1 kg    History of present illness:  83 year old male CAD CABG 1996-intervention PCI 2009 HFpEF 55-60% persistent A. 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 Carollee Herter 10/24 injected epi and lidocaine-as patient has some oozing as blood in oral orifice--hemoglobin has been somewhat stable 13-->12.6 Advises use of Topical black moistened Tea bag-a-the stabilized over the rest of the day on 10/26 and he was discharged Leukocytosis resolved CAD 1996+ PCI 2009 Continuing Plavix 75, Bystolic 5 mg resumed on discharge regular dose Permanent A. fib status post ablation/Tikosyn continue Tikosyn 250 every 12, cont Eliquis 2.5 twice daily CKD 3 Monitor trends, holding Lasix 20 daily currently on IV fluids 75 cc/h time limit until 10/27 Reflux Continue Pepcid 20 at bedtime Hyperlipidemia Continue Zetia 10-Crestor 40-LDL 59 Depression Recommendations: Trazodone 50 daily Impaired glucose tolerance A1c 6.2 Monitor trends but no medication at this time   Procedures:  Multiple including echo MRI carotid ultrasounds  Consultations:  Neurology  Epileptologist  Discharge Exam: Vitals:   10/22/19 0805 10/22/19 1143  BP: (!) 141/73 (!) 142/60  Pulse: 86 75  Resp: 17 19  Temp: 98.3 F (36.8 C) 98 F (36.7 C)  SpO2: 97% 100%    General: Awake alert coherent some bleeding from her gums when I saw him this morning Cardiovascular: S1-S2 no murmur rub or gallop Respiratory: Chest clear Right upper extremity is weaker than other extremities otherwise finger-nose-finger normal see earlier exam  Discharge Instructions   Discharge Instructions    Diet - low sodium heart healthy   Complete by: As directed    Discharge instructions   Complete by: As directed    We will ask that you get referral to outpatient therapy Use Eliquis instead of Xarelto in addition to  your plavix please ensure that you get OP follow up for your tooth-you may use damp Tea bags on your tooth to ensure that you do not bleed You can follow up with your dentist for a  visit to make sure he doesn't think any other procedures are required Please follow up with neurologist in 4-6 weeks   Increase activity slowly   Complete by: As directed      Allergies as of 10/22/2019      Reactions   Morphine And Related Other (See Comments)   IV forms- vein irritation   Tape Other (See Comments)   Skin Irritation      Medication List    STOP taking these medications   Xarelto 15 MG Tabs tablet Generic drug: Rivaroxaban     TAKE these medications   acetaminophen 500 MG tablet Commonly known as: TYLENOL Take 500 mg by mouth every 8 (eight) hours as needed for mild pain or moderate pain.   albuterol 108 (90 Base) MCG/ACT inhaler Commonly known as: ProAir HFA Inhale 2 puffs into the lungs every 4 (four) hours as needed for wheezing or shortness of breath (and prior to exercise).   Align 4 MG Caps Take 4 mg by mouth daily.   AMBULATORY NON FORMULARY MEDICATION Apply 1 application topically See admin instructions. Testosterone cream 150mg /15% Place 1 ml  onto the skin daily   apixaban 2.5 MG Tabs tablet Commonly known as: ELIQUIS Take 1 tablet (2.5 mg total) by mouth 2 (two) times daily.   clopidogrel 75 MG tablet Commonly known as: PLAVIX TAKE 1 TABLET (75 MG TOTAL) BY MOUTH DAILY WITH BREAKFAST.   dicyclomine 10 MG capsule Commonly known as: BENTYL Take 10 mg by mouth 3 (three) times daily as needed for spasms.   dofetilide 250 MCG capsule Commonly known as: TIKOSYN TAKE 1 CAPSULE BY MOUTH EVERY 12 HOURS *NEED OFFICE VISIT* What changed: See the new instructions.   ezetimibe 10 MG tablet Commonly known as: ZETIA TAKE 1 TABLET BY MOUTH EVERY DAY   famotidine 40 MG tablet Commonly known as: PEPCID Take 1 tablet (40 mg total) by mouth at bedtime.   furosemide 40 MG tablet Commonly known as: LASIX Take 0.5 tablets (20 mg total) by mouth daily.   HYDROcodone-acetaminophen 5-325 MG tablet Commonly known as: NORCO/VICODIN Take 1 tablet by  mouth every 6 (six) hours as needed (For mouth pain).   lactobacillus acidophilus & bulgar chewable tablet Chew 2 tablets by mouth daily. (0800)   loperamide 2 MG capsule Commonly known as: IMODIUM Take 2 mg by mouth 2 (two) times daily as needed for diarrhea or loose stools. One or two tablets daily as needed   LORazepam 0.5 MG tablet Commonly known as: ATIVAN Take 0.5 mg by mouth at bedtime.   magnesium oxide 400 MG tablet Commonly known as: MAG-OX Take 400 mg by mouth daily.   methocarbamol 500 MG tablet Commonly known as: ROBAXIN Take 500 mg by mouth 2 (two) times daily.   nebivolol 10 MG tablet Commonly known as: Bystolic Take 0.5 tablets (5 mg total) by mouth daily.   nitroGLYCERIN 0.4 MG SL tablet Commonly known as: Nitrostat Place 1 tablet (0.4 mg total) under the tongue every 5 (five) minutes as needed for chest pain.   potassium chloride SA 20 MEQ tablet Commonly known as: KLOR-CON Take 1 tablet (20 mEq total) by mouth at bedtime.   rosuvastatin 40 MG tablet Commonly known as: CRESTOR TAKE 1 TABLET BY MOUTH EVERY DAY  sodium chloride 0.65 % Soln nasal spray Commonly known as: OCEAN Place 1 spray into both nostrils as needed for congestion.   traZODone 50 MG tablet Commonly known as: DESYREL Take 50 mg by mouth at bedtime.   Vitamin D (Ergocalciferol) 1.25 MG (50000 UT) Caps capsule Commonly known as: DRISDOL Take 50,000 Units by mouth every 14 (fourteen) days. Take on 1st and 15th      Allergies  Allergen Reactions  . Morphine And Related Other (See Comments)    IV forms- vein irritation  . Tape Other (See Comments)    Skin Irritation      The results of significant diagnostics from this hospitalization (including imaging, microbiology, ancillary and laboratory) are listed below for reference.    Significant Diagnostic Studies: Mr Angio Neck W Wo Contrast  Result Date: 10/20/2019 CLINICAL DATA:  Stroke, follow-up. EXAM: MRA NECK WITHOUT  AND WITH CONTRAST TECHNIQUE: Multiplanar and multiecho pulse sequences of the neck were obtained without and with intravenous contrast. Angiographic images of the neck were obtained using MRA technique without and with intravenous contrast. CONTRAST:  52mL GADAVIST GADOBUTROL 1 MMOL/ML IV SOLN COMPARISON:  Brain MRI performed earlier the same day 10/20/2019, FINDINGS: Standard aortic branching. The right common and internal carotid arteries are widely patent within the neck without stenosis. The left common and internal carotid arteries are patent within the neck without significant stenosis (50% or greater). Mild focal atherosclerotic narrowing of the mid to distal left common carotid artery. Atherosclerotic irregularity of the distal left common carotid artery with less than 50% stenosis. No measurable stenosis within the proximal left ICA. No appreciable stenosis within the intracranial internal carotid arteries or M1 middle cerebral arteries. The bilateral vertebral arteries are widely patent within the neck without significant stenosis. No appreciable stenosis within the intracranial vertebral arteries or basilar artery. IMPRESSION: Bilateral common and internal carotid arteries patent within the neck. Atherosclerotic irregularity of the distal left common carotid artery with less than 50% stenosis. Mild focal atherosclerotic narrowing is also present more proximally within the mid to distal left common carotid artery. Bilateral vertebral arteries patent within the neck without significant stenosis. Electronically Signed   By: Kellie Simmering DO   On: 10/20/2019 22:56   Mr Brain Wo Contrast  Result Date: 10/20/2019 CLINICAL DATA:  Right arm weakness EXAM: MRI HEAD WITHOUT CONTRAST TECHNIQUE: Multiplanar, multiecho pulse sequences of the brain and surrounding structures were obtained without intravenous contrast. COMPARISON:  CT head 07/12/2013 FINDINGS: Brain: Restricted diffusion in the left parietal cortex  in the region the motor strip compatible with acute infarct. There is diffuse cortical edema. No associated hemorrhage. No other acute infarct. Mild atrophy without hydrocephalus. No significant chronic ischemia. Chronic microhemorrhage left temporoparietal periventricular white matter, separate from the area of acute infarct. Negative for midline shift. Vascular: Normal arterial flow voids. Skull and upper cervical spine: Negative Sinuses/Orbits: Mild mucosal edema paranasal sinuses. Bilateral cataract surgery Other: Image quality degraded by mild motion IMPRESSION: Acute infarct left parietal cortex involving the motor strip accounting for left arm weakness. No associated hemorrhage. These results will be called to the ordering clinician or representative by the Radiologist Assistant, and communication documented in the PACS or zVision Dashboard. Electronically Signed   By: Franchot Gallo M.D.   On: 10/20/2019 11:01   Vas US Carotid (at [facility] Only)  Result Date: 10/22/2019 Carotid Arterial Duplex Study Indications:       CVA. Risk Factors:      Hyperlipidemia, prior  CVA. Comparison Study:  No prior studies. Performing Technologist: Oliver Hum RVT  Examination Guidelines: A complete evaluation includes B-mode imaging, spectral Doppler, color Doppler, and power Doppler as needed of all accessible portions of each vessel. Bilateral testing is considered an integral part of a complete examination. Limited examinations for reoccurring indications may be performed as noted.  Right Carotid Findings: +----------+--------+-------+--------+--------------------------------+--------+           PSV cm/sEDV    StenosisPlaque Description              Comments                   cm/s                                                    +----------+--------+-------+--------+--------------------------------+--------+ CCA Prox  105     7              smooth and heterogenous                   +----------+--------+-------+--------+--------------------------------+--------+ CCA Distal84      15             smooth and heterogenous                  +----------+--------+-------+--------+--------------------------------+--------+ ICA Prox  83      18             smooth, heterogenous and                                                  calcific                                 +----------+--------+-------+--------+--------------------------------+--------+ ICA Distal125     25                                                      +----------+--------+-------+--------+--------------------------------+--------+ ECA       89      10                                                      +----------+--------+-------+--------+--------------------------------+--------+ +----------+--------+-------+--------+-------------------+           PSV cm/sEDV cmsDescribeArm Pressure (mmHG) +----------+--------+-------+--------+-------------------+ Subclavian175                                        +----------+--------+-------+--------+-------------------+ +---------+--------+--+--------+--+---------+ VertebralPSV cm/s95EDV cm/s14Antegrade +---------+--------+--+--------+--+---------+  Left Carotid Findings: +----------+--------+-------+--------+--------------------------------+--------+           PSV cm/sEDV    StenosisPlaque Description              Comments  cm/s                                                    +----------+--------+-------+--------+--------------------------------+--------+ CCA Prox  140     21             smooth and heterogenous                  +----------+--------+-------+--------+--------------------------------+--------+ CCA Distal88      15             smooth and heterogenous                  +----------+--------+-------+--------+--------------------------------+--------+ ICA Prox  179     32              smooth, heterogenous and                                                  calcific                                 +----------+--------+-------+--------+--------------------------------+--------+ ICA Distal110     31                                                      +----------+--------+-------+--------+--------------------------------+--------+ ECA       200     20                                                      +----------+--------+-------+--------+--------------------------------+--------+ +----------+--------+--------+--------+-------------------+           PSV cm/sEDV cm/sDescribeArm Pressure (mmHG) +----------+--------+--------+--------+-------------------+ Subclavian171                                         +----------+--------+--------+--------+-------------------+ +---------+--------+--+--------+--+---------+ VertebralPSV cm/s70EDV cm/s11Antegrade +---------+--------+--+--------+--+---------+  Summary: Right Carotid: Velocities in the right ICA are consistent with a 1-39% stenosis. Left Carotid: Velocities in the left ICA are consistent with a 1-39% stenosis. Vertebrals: Bilateral vertebral arteries demonstrate antegrade flow. *See table(s) above for measurements and observations.     Preliminary    Vas [place] Transcranial Doppler  Result Date: 10/22/2019  Transcranial Doppler Indications: Stroke. Comparison Study: No prior studies. Performing Technologist: Oliver Hum RVT  Examination Guidelines: A complete evaluation includes B-mode imaging, spectral Doppler, color Doppler, and power Doppler as needed of all accessible portions of each vessel. Bilateral testing is considered an integral part of a complete examination. Limited examinations for reoccurring indications may be performed as noted.  +----------+-------------+----------+-----------+-------+ RIGHT TCD Right VM (cm)Depth (cm)PulsatilityComment  +----------+-------------+----------+-----------+-------+ MCA           55.00                 1.39            +----------+-------------+----------+-----------+-------+  Term ICA      39.00                  1.4            +----------+-------------+----------+-----------+-------+ PCA           20.00                  1.7            +----------+-------------+----------+-----------+-------+ Opthalmic     13.00                 0.85            +----------+-------------+----------+-----------+-------+ ICA siphon    21.00                 1.44            +----------+-------------+----------+-----------+-------+ Vertebral    -25.00                 1.62            +----------+-------------+----------+-----------+-------+  +----------+------------+----------+-----------+-------+ LEFT TCD  Left VM (cm)Depth (cm)PulsatilityComment +----------+------------+----------+-----------+-------+ MCA          46.00                 1.63            +----------+------------+----------+-----------+-------+ ACA          -30.00                1.31            +----------+------------+----------+-----------+-------+ Term ICA     40.00                 1.34            +----------+------------+----------+-----------+-------+ PCA          37.00                 1.44            +----------+------------+----------+-----------+-------+ Opthalmic    22.00                 1.21            +----------+------------+----------+-----------+-------+ ICA siphon   18.00                 1.41            +----------+------------+----------+-----------+-------+ Vertebral    -26.00                1.45            +----------+------------+----------+-----------+-------+  +------------+-------+-------+             VM cm/sComment +------------+-------+-------+ Prox Basilar-37.00  1.38   +------------+-------+-------+ Summary: This was a normal transcranial Doppler study, with normal  flow direction and velocity of all identified vessels of the anterior and posterior circulations, with no evidence of stenosis, vasospasm or occlusion. There was no evidence of intracranial disease.  *See table(s) above for measurements and observations.  Diagnosing physician: Antony Contras MD Electronically signed by Antony Contras MD on 10/22/2019 at 3:14:28 PM.    Final     Microbiology: Recent Results (from the past 240 hour(s))  SARS CORONAVIRUS 2 (TAT 6-24 HRS) Nasopharyngeal Nasopharyngeal Swab     Status: None   Collection Time: 10/21/19  1:04 AM   Specimen: Nasopharyngeal Swab  Result Value Ref Range Status   SARS Coronavirus 2 NEGATIVE NEGATIVE Final  Comment: (NOTE) SARS-CoV-2 target nucleic acids are NOT DETECTED. The SARS-CoV-2 RNA is generally detectable in upper and lower respiratory specimens during the acute phase of infection. Negative results do not preclude SARS-CoV-2 infection, do not rule out co-infections with other pathogens, and should not be used as the sole basis for treatment or other patient management decisions. Negative results must be combined with clinical observations, patient history, and epidemiological information. The expected result is Negative. Fact Sheet for Patients: SugarRoll.be Fact Sheet for Healthcare Providers: https://www.woods-mathews.com/ This test is not yet approved or cleared by the [place] FDA and  has been authorized for detection and/or diagnosis of SARS-CoV-2 by FDA under an Emergency Use Authorization (EUA). This EUA will remain  in effect (meaning this test can be used) for the duration of the COVID-19 declaration under Section 56 4(b)(1) of the Act, 21 U.S.C. section 360bbb-3(b)(1), unless the authorization is terminated or revoked sooner. Performed at [facility], [place] [address], [place], [place] [address]      Labs: Basic Metabolic Panel: Recent Labs  Lab  10/20/19 1304 10/21/19 0959 10/22/19 0505  NA 138 137 137  K 5.1 4.7 3.9  CL 101 102 103  CO2 24 27 23   GLUCOSE 119* 125* 103*  BUN 25* 30* 26*  CREATININE 1.71* 1.59* 1.35*  CALCIUM 10.6* 9.3 8.9   Liver Function Tests: Recent Labs  Lab 10/20/19 1304 10/21/19 0959  AST 37 31  ALT 28 21  ALKPHOS 91 70  BILITOT 1.9* 1.4*  PROT 7.9 6.1*  ALBUMIN 4.3 3.4*   No results for input(s): LIPASE, AMYLASE in the last 168 hours. No results for input(s): AMMONIA in the last 168 hours. CBC: Recent Labs  Lab 10/20/19 1304 10/21/19 0959 10/22/19 0505  WBC 10.8* 8.1 9.3  NEUTROABS 8.5* 5.8 6.0  HGB 15.9 13.0 12.6*  HCT 49.1 39.9 38.2*  MCV 98.0 98.5 97.2  PLT 217 171 179   Cardiac Enzymes: No results for input(s): CKTOTAL, CKMB, CKMBINDEX, TROPONINI in the last 168 hours. BNP: BNP (last 3 results) No results for input(s): BNP in the last 8760 hours.  ProBNP (last 3 results) No results for input(s): PROBNP in the last 8760 hours.  CBG: No results for input(s): GLUCAP in the last 168 hours.     Signed:  Nita Sells MD   [facility] 10/22/2019, 3:14 PM

## 2019-10-22 NOTE — Progress Notes (Signed)
STROKE TEAM PROGRESS NOTE   HISTORY OF PRESENT ILLNESS (per record) [name] is an 83 y.o. 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 Fib (s/p ablation x 2 on Xarelto), CAD with multiple stents on Plavix, MI, HTN, HLD   Patient was taken off Xarelto 3 days prior to a tooth extraction. Per patient and wife on Wednesday evening after his tooth extraction patient had a sudden onset of right hand uncontrollable flinging movements, with description being most consistent with hemiballismus. He also experienced numbness of the right side of his face. At that time he presented to the ED saying he was having a stroke but after evaluation there was told that it was not a stroke and was sent home. Patient had been off Xarelto for tooth extraction Sunday, Monday and Tuesday. Started Xarelto back on Wednesday night. Tooth extraction began bleeding and he went to the doctors office. Bleeding stopped spontaneously, but physician said that his arm movements were troubling and that he needed an MRI. Due to the bleeding, Xarelto was stopped for another day and then restarted. Patient had MRI Saturday at [facility]. The MRI showed an acute infarct of the left parietal cortex involving the motor strip. Patient then was sent to the ED. He denied any vision changes, CP, SOB or any numbness and tingling today. He denies smoking, EtOH or drug abuse. ED course:  BP: 178/54   BG: 119 Date last known well: 10/17/2019 tPA Given: No. Outside of window. On xarelto Modified Rankin: Rankin Score=0 NIHSS: 0   INTERVAL HISTORY I have reviewed the history of presenting illness in detail with the patient.  Discussed with patient and wife at the bedside.  He states he is a lot better.  He still has some involuntary twitchings in his right upper extremity and mild proprioceptive difficulties but his involuntary movements a lot improved.  He is a  Software engineer still working.  MRI of the brain shows left parietal infarct and MRA of the neck showed less than 50% stenosis.  Echocardiogram is unremarkable.  Carotid ultrasound shows 1-39% bilateral stenosis.  Transcranial Doppler studies was normal.    OBJECTIVE Vitals:   10/21/19 2310 10/22/19 0308 10/22/19 0805 10/22/19 1143  BP: (!) 150/63 (!) 145/85 (!) 141/73 (!) 142/60  Pulse: 84 71 86 75  Resp: 18 17 17 19   Temp: 97.7 F (36.5 C) (!) 97.4 F (36.3 C) 98.3 F (36.8 C) 98 F (36.7 C)  TempSrc: Oral Oral Oral Oral  SpO2: 93% 95% 97% 100%  Weight:      Height:        CBC:  Recent Labs  Lab 10/21/19 0959 10/22/19 0505  WBC 8.1 9.3  NEUTROABS 5.8 6.0  HGB 13.0 12.6*  HCT 39.9 38.2*  MCV 98.5 97.2  PLT 171 0000000    Basic Metabolic Panel:  Recent Labs  Lab 10/21/19 0959 10/22/19 0505  NA 137 137  K 4.7 3.9  CL 102 103  CO2 27 23  GLUCOSE 125* 103*  BUN 30* 26*  CREATININE 1.59* 1.35*  CALCIUM 9.3 8.9    Lipid Panel:     Component Value Date/Time   CHOL 108 10/21/2019 0441   TRIG 65 10/21/2019 0441   HDL 36 (L) 10/21/2019 0441   CHOLHDL 3.0 10/21/2019 0441   VLDL 13 10/21/2019 0441   LDLCALC 59 10/21/2019 0441   HgbA1c:  Lab Results  Component Value Date  HGBA1C 6.2 (H) 10/21/2019   Urine Drug Screen: No results found for: LABOPIA, COCAINSCRNUR, LABBENZ, AMPHETMU, THCU, LABBARB  Alcohol Level No results found for: [facility]  IMAGING  Mr Angio Neck W Wo Contrast 10/20/2019 IMPRESSION:  Bilateral common and internal carotid arteries patent within the neck. Atherosclerotic irregularity of the distal left common carotid artery with less than 50% stenosis. Mild focal atherosclerotic narrowing is also present more proximally within the mid to distal left common carotid artery. Bilateral vertebral arteries patent within the neck without significant stenosis.   Mr Brain Wo Contrast 10/20/2019 IMPRESSION:  Acute infarct left parietal cortex involving the  motor strip accounting for left arm weakness. No associated hemorrhage.     Transthoracic Echocardiogram  00/00/2020 Pending   Bilateral Carotid Dopplers  00/00/2020 Pending   ECG - SR rate 70 BPM. (See cardiology reading for complete details)    PHYSICAL EXAM Blood pressure (!) 142/60, pulse 75, temperature 98 F (36.7 C), temperature source Oral, resp. rate 19, height 5\' 6"  (1.676 m), weight 54.1 kg, SpO2 100 %.   Awake, alert, fully oriented. Face symmetrical.  Tongue midline. EOMI, PERL. 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  Reduced pinprick on the right arms vs left, but intact in face and leg. Intermittent myoclonic jerks of the right arm.  No rhythmic sustained activity. Coord- intact. Sensory ataxia with right FTN when eyes closed.         ASSESSMENT/PLAN [name] is a 83 y.o. 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 Fib (s/p ablation x 2) on Xarelto - held 3 days for tooth extraction, CAD with multiple stents on Plavix, MI, CHF, COPD, HTN, and HLD presenting with sudden onset of right hand uncontrollable flinging movements, with description being most consistent with hemiballismus. He also experienced numbness of the right side of his face. He did not receive IV t-PA due to Xarelto therapy and minimal deficits.  Stroke: Acute infarct left parietal cortex involving the motor strip   Resultant  Right arm numbness and sensory ataxia.   Code Stroke CT Head - not ordered  CT head - not ordered  MRI head - Acute infarct left parietal cortex involving the motor strip   MRA Neck -  Atherosclerotic irregularity of the distal left common carotid artery with less than 50% stenosis.  CTA H&N - not ordered  CT Perfusion - not ordered  Carotid Doppler - pending  2D Echo - pending  Sars Corona Virus 2  - negative  LDL - 59  HgbA1c - 6.2  UDS - not ordered  VTE prophylaxis - Eliquis Diet  Diet Order            Diet - low sodium heart healthy        Diet Heart Fluid consistency: Thin  Diet effective ____              clopidogrel 75 mg daily and Xarelto (rivaroxaban) daily prior to admission, now on clopidogrel 75 mg daily and Eliquis (apixaban) daily  Patient counseled to be compliant with his antithrombotic medications  Ongoing aggressive stroke risk factor management  Therapy recommendations:  pending  Disposition:  Pending  Hypertension  Home BP meds: Bystolic  Current BP meds: Bystolic  Blood pressure somewhat low at times  . Permissive hypertension (OK if < 220/120) but gradually normalize  in 5-7 days  . Long-term BP goal normotensive  Hyperlipidemia  Home Lipid lowering medication: Crestor 40 mg daily and Zetia 10 mg daily  LDL 59, goal < 70  Current lipid lowering medication: Crestor 40 mg daily and Zetia 10 mg daily  Continue statin at discharge  Other Stroke Risk Factors  Advanced age  Former cigarette smoker - quit  Hx stroke/TIA  Coronary artery disease  Obstructive sleep apnea, on BiPAP at home  Congestive Heart Failure  Atrial fibrillation  Other Active Problems  IV infiltrated during MRA  Creatinine - 1.71  Hospital day # 2  Acute left parietal infarct,likely cardioembolic off Xarelto due to tooth extraction.  It is causing a sensory ataxia, sensory loss, and involuntary hemiballismus-like movements   I expect it to naturally go away with some time.  He has been switched to Eliquis  for cardioembolism prevention. He is also on Plavix for 7 coronary stents.  He is aware of risk of significant hemorrhage on combination of Eliquis and Plavix,but risk benefit favors continuing it for now.  Patient unfortunately still having some dental bleeding and if this continues he may need to look at alternative treatment options for stroke  prevention A. fib like left atrial appendage occlusion.  Follow-up as an outpatient in the stroke clinic in 6 weeks.  Discussed with patient, wife and Dr. Verlon Au.  Greater than 50% time during this 25-minute visit was spent on counseling and coordination of care about his stroke and discussion about treatment options for stroke prevention and anticoagulation and answering questions.  Antony Contras,, MD  To contact Stroke Continuity provider, please refer to [email or web address] After hours, contact General Neurology

## 2019-10-22 NOTE — Progress Notes (Signed)
EEG complete - results pending 

## 2019-10-22 NOTE — Progress Notes (Addendum)
Occupational Therapy Evaluation Patient Details Name: [name] MRN: [id number] DOB: 27-Jul-1936 Today's Date: 10/22/2019    History of Present Illness [name] is an 83 y.o. 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 Fib (s/p ablation x 2 on Xarelto), CAD with multiple stents on Plavix, MI, HTN, HLD. Was taken off Xerelto 3 days prior to tooth extraction, presented with sudden onset of right hand uncontrollable movements consistent with hemiballismus and numbness on the right side of his face.  EEG suggestive of cortical dysfunction left frontal region, nonspecific etiology. No seizures or epileptiform discharges were seen throughout the recording.   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 Pt currently requires supervision during ADL completion and functional mobiltiy. Pt demonstrates decreased fine motor, gross motor strength and coordination of RUE impacting his ability to perform work related activities. Due to current level of functioning and skills required for work, pt would benefit from outpatient OT services.All further OT goals to be addressed at next venue of care. OT to sign off.     Follow Up Recommendations  Outpatient OT    Equipment Recommendations  None recommended by OT    Recommendations for Other Services       Precautions / Restrictions Precautions Precautions: Fall Restrictions Weight Bearing Restrictions: No      Mobility Bed Mobility Overal bed mobility: Modified Independent                Transfers Overall transfer level: Needs assistance Equipment used: None Transfers: Sit to/from Stand Sit to Stand: Supervision         General transfer comment: supervision for safety    Balance Overall balance assessment: Mild deficits observed, not  formally tested                                         ADL either performed or assessed with clinical judgement   ADL Overall ADL's : Needs assistance/impaired Eating/Feeding: Modified independent   Grooming: Supervision/safety;Standing Grooming Details (indicate cue type and reason): washed hands at sink level;required increased time to put toothpaste on toothbrush Upper Body Bathing: Supervision/ safety   Lower Body Bathing: Supervison/ safety   Upper Body Dressing : Supervision/safety   Lower Body Dressing: Supervision/safety Lower Body Dressing Details (indicate cue type and reason): pt donned shoes sitting EOB Toilet Transfer: Copy Details (indicate cue type and reason): ambulated to the bathroom with supervision Toileting- Clothing Manipulation and Hygiene: Supervision/safety;Sit to/from stand       Functional mobility during ADLs: Supervision/safety General ADL Comments: supervision for safety;pt required increased time and effort during fine motor activities     Vision Baseline Vision/History: Wears glasses Wears Glasses: Reading only Patient Visual Report: No change from baseline       Perception     Praxis      Pertinent Vitals/Pain Pain Assessment: No/denies pain     Hand Dominance Right   Extremity/Trunk Assessment Upper Extremity Assessment Upper Extremity Assessment: RUE deficits/detail RUE Deficits / Details: decreased strength and coordination;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 impacting shoulder ROM limited horrizontal adduction, 2/5 elbow extension, decreased sensation in finger tips, pt reports this is baseline;pt reports new change in decreased coordination, pt demonstrated difficulty with typing (  which is required for work);provided pt with fine motor activies HEP and theraputty, educated pt on HEP RUE Sensation: decreased light touch RUE Coordination: decreased fine motor;decreased gross motor    Lower Extremity Assessment Lower Extremity Assessment: Defer to PT evaluation;Overall [facility] for tasks assessed   Cervical / Trunk Assessment Cervical / Trunk Assessment: Normal   Communication Communication Communication: No difficulties   Cognition Arousal/Alertness: Awake/alert Behavior During Therapy: WFL for tasks assessed/performed Overall Cognitive Status: Within Functional Limits for tasks assessed                                     General Comments  pt's wife present throughout session    Exercises Exercises: Hand activities Hand Activities Writing Skills: Right;Seated Writing Skills Limitations: required increased time and effort Open and Close Containers: Both;5 reps Proprioception - Hold Cup and Walk: Both Proprioception - Hold Cup and Walk Limitations: typed while standing;R hand overshot 1x during typing;required increased time and effort made multiple mistakes when typing    Shoulder Instructions      Home Living Family/patient expects to be discharged to:: Private residence Living Arrangements: Spouse/significant other Available Help at Discharge: Family Type of Home: House Home Access: Stairs to enter [employer] of Steps: 1   Home Layout: Two level;Able to live on main level with bedroom/bathroom Alternate Level Stairs-Number of Steps: 15 - 5   Bathroom Shower/Tub: Teacher, early years/pre: Standard Bathroom Accessibility: Yes How Accessible: Accessible via wheelchair;Accessible via walker Home Equipment: Bedside commode      Lives With: Spouse    Prior Functioning/Environment Level of Independence: Independent        Comments: pt is currently working, he is a Retail buyer Problem List: Decreased coordination;Decreased knowledge of precautions;Impaired UE functional use      OT Treatment/Interventions:      OT Goals(Current goals can be found in the care plan section) Acute Rehab OT  Goals Patient Stated Goal: to be able to continue working OT Goal Formulation: With patient Time For Goal Achievement: 11/05/19 Potential to Achieve Goals: Good  OT Frequency:     Barriers to D/C:            Co-evaluation              AM-PAC OT "6 Clicks" Daily Activity     Outcome Measure Help from another person eating meals?: None Help from another person taking care of personal grooming?: A Little Help from another person toileting, which includes using toliet, bedpan, or urinal?: A Little Help from another person bathing (including washing, rinsing, drying)?: A Little Help from another person to put on and taking off regular upper body clothing?: A Little Help from another person to put on and taking off regular lower body clothing?: A Little 6 Click Score: 19   End of Session Nurse Communication: Mobility status  Activity Tolerance: Patient tolerated treatment well Patient left: in bed;with call bell/phone within reach;with family/visitor present;with nursing/sitter in room  OT Visit Diagnosis: Muscle weakness (generalized) (M62.81)                Time: [id number] OT Time Calculation (min): 39 min Charges:  OT General Charges $OT Visit: 1 Visit OT Evaluation $OT Eval Low Complexity: 1 Low OT Treatments $Self Care/Home Management : 23-37 mins  Dorinda Hill OTR/L [place] Office: [phone]  Wyn Forster 10/22/2019, 12:06 PM

## 2019-10-22 NOTE — Progress Notes (Addendum)
Initial Nutrition Assessment  DOCUMENTATION CODES:   Non-severe (moderate) malnutrition in context of chronic illness  INTERVENTION:  Provide Ensure Enlive po BID, each supplement provides 350 kcal and 20 grams of protein  Provide Magic cup TID with meals, each supplement provides 290 kcal and 9 grams of protein  Encourage adequate PO intake.   NUTRITION DIAGNOSIS:   Moderate Malnutrition related to chronic illness(CHF, COPD) as evidenced by moderate fat depletion, moderate muscle depletion.  GOAL:   Patient will meet greater than or equal to 90% of their needs  MONITOR:   PO intake, Supplement acceptance, Skin, Weight trends, Labs, I & O's  REASON FOR ASSESSMENT:   Consult Assessment of nutrition requirement/status  ASSESSMENT:   83 y.o. male with medical history significant of afib; OSA on BPIAP; HTN; HLD; afib; COPD; CAD s/p CABG; CVA; and chronic diastolic CHF presenting with stroke.  Pt reports appetite is fine, however reports he is unable to chew foods due to recent oral surgery. MD advises to avoid chewing foods temporarily to help aid his teeth surgery to prevent further bleeding. Pt has been consuming very soft and liquid type foods, such as puddings, yogurt, cottage cheese, ice creams, milk, and Ensures. Meal completion has been 50%. Pt with no significant weight loss per weight records. RD to order nutritional supplements to aid in caloric and protein needs. Wife at bedside has been encouraging pt po intake.   Labs and medications reviewed.   NUTRITION - FOCUSED PHYSICAL EXAM:    Most Recent Value  Orbital Region  No depletion  Upper Arm Region  Moderate depletion  Thoracic and Lumbar Region  Moderate depletion  Buccal Region  Unable to assess  Temple Region  Unable to assess  Clavicle Bone Region  No depletion  Clavicle and Acromion Bone Region  Mild depletion  Scapular Bone Region  Unable to assess  Dorsal Hand  Unable to assess  Patellar Region   Moderate depletion  Anterior Thigh Region  Moderate depletion  Posterior Calf Region  Moderate depletion  Edema (RD Assessment)  None  Hair  Reviewed  Eyes  Reviewed  Mouth  Reviewed  Skin  Reviewed  Nails  Reviewed      Diet Order:   Diet Order            Diet Heart Fluid consistency: Thin  Diet effective ____              EDUCATION NEEDS:   Not appropriate for education at this time  Skin:  Skin Assessment: Reviewed RN Assessment  Last BM:  10/25  Height:   Ht Readings from Last 1 Encounters:  10/20/19 5\' 6"  (1.676 m)    Weight:   Wt Readings from Last 1 Encounters:  10/21/19 54.1 kg    Ideal Body Weight:  64.5 kg  BMI:  Body mass index is 19.25 kg/m.  Estimated Nutritional Needs:   Kcal:  1750-1900  Protein:  75-90 grams  Fluid:  >/= 1.8 L/day    Corrin Parker, MS, RD, LDN Pager # [phone] After hours/ weekend pager # [phone]

## 2019-10-22 NOTE — Progress Notes (Signed)
Hospitalist progress note  If 7PM-7AM,  night-coverage-look on AMION -prefer pages-not epic osama, sigley  [id number] DOB: 03-Jun-1936 DOA: 10/20/2019 PCP: Burnard Bunting, MD  Narrative:  83 year old male CAD CABG 1996-intervention PCI 2009 HFpEF 55-60% persistent A. 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 Carollee Herter 10/24 injected epi and lidocaine-as patient has some oozing as blood in oral orifice--hemoglobin has been somewhat stable 13-->12.6 Advises use of Topical black moistened Tea bag-also if not stopping, can use Thrombi-pad--nursing is aware Leukocytosis resolved CAD 1996+ PCI 2009 Continuing Plavix 75, Bystolic 5 mg changed to [id number] beta-blocker over the next several days Permanent A. fib status post ablation/Tikosyn continue Tikosyn 250 every 12, cont Eliquis 2.5 twice daily CKD 3 Monitor trends, holding Lasix 20 daily currently on IV fluids 75 cc/h time limit until 10/27 Reflux Continue Pepcid 20 at bedtime Hyperlipidemia Continue Zetia 10-Crestor 40-LDL 59 Depression Recommendations: Trazodone 50 daily Impaired glucose tolerance A1c 6.2 Monitor trends but no medication at this time   DVT prophylaxis: On Eliquis  Code Status:   Full code   Family Communication:   None Disposition Plan: Inpatient pending resolution  Consultants:   Neurology Procedures:   No Antimicrobials:   N0 Subjective:  Fair no distress no cp no feevr  some spitting and oozing of blood-clot is still present-I wasn't made aware of bleeding until this am Objective: Vitals:   10/21/19 2200 10/21/19 2310 10/22/19 0308 10/22/19 0805  BP:  (!) 150/63 (!) 145/85 (!) 141/73  Pulse:  84 71 86  Resp:  18 17 17   Temp:  97.7 F (36.5 C) (!) 97.4 F (36.3 C) 98.3 F (36.8 C)  TempSrc:  Oral Oral Oral  SpO2:  93% 95% 97%  Weight: 54.1 kg     Height:        Intake/Output Summary (Last 24 hours) at 10/22/2019 0917 Last data filed at 10/21/2019 1653 Gross per 24 hour  Intake 623.82 ml  Output -  Net 623.82 ml   Filed Weights   10/20/19 1440 10/21/19 2200  Weight: 49.9 kg 54.1 kg    Examination: EOMI NCAT pupils equally reactive no visual field cuts Oozing from the L upper molars with some dried blood Chest is clear no added sound no rales no rhonchi Abdomen soft nontender no rebound  Patient in process gettign EEG--couldn't perfom full exam  Data Reviewed: I have personally reviewed following labs and imaging studies CBC: Recent Labs  Lab 10/20/19 1304 10/21/19 0959 10/22/19 0505  WBC 10.8* 8.1 9.3  NEUTROABS 8.5* 5.8 6.0  HGB 15.9 13.0 12.6*  HCT 49.1 39.9 38.2*  MCV 98.0 98.5 97.2  PLT 217 171 0000000   Basic Metabolic Panel: Recent Labs  Lab 10/20/19 1304 10/21/19 0959 10/22/19 0505  NA 138 137 137  K 5.1 4.7 3.9  CL 101 102 103  CO2 24 27 23   GLUCOSE 119* 125* 103*  BUN 25* 30* 26*  CREATININE 1.71* 1.59* 1.35*  CALCIUM 10.6* 9.3 8.9   GFR: Estimated Creatinine Clearance: 32.8 mL/min (  A) (by C-G formula based on SCr of 1.35 mg/dL (H)). Liver Function Tests: Recent Labs  Lab 10/20/19 1304 10/21/19 0959  AST 37 31  ALT 28 21  ALKPHOS 91 70  BILITOT 1.9* 1.4*  PROT 7.9 6.1*  ALBUMIN 4.3 3.4*   No results for input(s): LIPASE, AMYLASE in the last 168 hours. No results for input(s): AMMONIA in the last 168 hours. Coagulation Profile: Recent Labs  Lab 10/20/19 1304  INR 1.6*   Cardiac  Enzymes: Radiology Studies: Reviewed images personally in health database  Scheduled Meds: . apixaban  2.5 mg Oral BID  . clopidogrel  75 mg Oral Q breakfast  . dofetilide  250 mcg Oral Q12H  . ezetimibe  10 mg Oral Daily  . famotidine  20 mg Oral QHS  . LORazepam  0.5 mg Oral QHS  . magnesium oxide  400 mg Oral Daily  . methocarbamol  500 mg Oral BID  . nebivolol  2.5 mg Oral Daily  . rosuvastatin  40 mg Oral Daily  . traZODone  50 mg Oral QHS   Continuous Infusions: . sodium chloride Stopped (10/22/19 0845)    LOS: 2 days   Time spent: [place], MD [facility]  10/22/2019, 9:17 AM

## 2019-10-23 ENCOUNTER — Other Ambulatory Visit: Payer: Self-pay | Admitting: Cardiology

## 2019-10-23 ENCOUNTER — Other Ambulatory Visit (HOSPITAL_COMMUNITY): Payer: Self-pay

## 2019-10-23 MED ORDER — DOFETILIDE 250 MCG PO CAPS: ORAL_CAPSULE | ORAL | 3 refills | Status: DC

## 2019-10-24 ENCOUNTER — Encounter (HOSPITAL_BASED_OUTPATIENT_CLINIC_OR_DEPARTMENT_OTHER): Payer: Medicare Other | Admitting: Cardiology

## 2019-10-24 DIAGNOSIS — G4733 Obstructive sleep apnea (adult) (pediatric): Secondary | ICD-10-CM | POA: Diagnosis not present

## 2019-10-24 DIAGNOSIS — I480 Paroxysmal atrial fibrillation: Secondary | ICD-10-CM | POA: Diagnosis not present

## 2019-10-24 DIAGNOSIS — N1831 Chronic kidney disease, stage 3a: Secondary | ICD-10-CM | POA: Diagnosis not present

## 2019-10-24 DIAGNOSIS — G4700 Insomnia, unspecified: Secondary | ICD-10-CM | POA: Diagnosis not present

## 2019-10-24 DIAGNOSIS — I2510 Atherosclerotic heart disease of native coronary artery without angina pectoris: Secondary | ICD-10-CM | POA: Diagnosis not present

## 2019-10-24 DIAGNOSIS — M8580 Other specified disorders of bone density and structure, unspecified site: Secondary | ICD-10-CM | POA: Diagnosis not present

## 2019-10-24 DIAGNOSIS — I130 Hypertensive heart and chronic kidney disease with heart failure and stage 1 through stage 4 chronic kidney disease, or unspecified chronic kidney disease: Secondary | ICD-10-CM | POA: Diagnosis not present

## 2019-10-24 DIAGNOSIS — I639 Cerebral infarction, unspecified: Secondary | ICD-10-CM | POA: Diagnosis not present

## 2019-10-24 DIAGNOSIS — R259 Unspecified abnormal involuntary movements: Secondary | ICD-10-CM | POA: Diagnosis not present

## 2019-10-24 DIAGNOSIS — I509 Heart failure, unspecified: Secondary | ICD-10-CM | POA: Diagnosis not present

## 2019-10-24 DIAGNOSIS — E785 Hyperlipidemia, unspecified: Secondary | ICD-10-CM | POA: Diagnosis not present

## 2019-10-24 DIAGNOSIS — R531 Weakness: Secondary | ICD-10-CM | POA: Diagnosis not present

## 2019-11-13 ENCOUNTER — Telehealth (HOSPITAL_COMMUNITY): Payer: Self-pay | Admitting: Cardiology

## 2019-11-13 ENCOUNTER — Other Ambulatory Visit: Payer: Self-pay | Admitting: Cardiology

## 2019-11-13 ENCOUNTER — Ambulatory Visit (HOSPITAL_COMMUNITY)
Admission: RE | Admit: 2019-11-13 | Discharge: 2019-11-13 | Disposition: A | Discharge status: Home / Self Care | Payer: Medicare Other | Source: Ambulatory Visit | Attending: Physician Assistant | Admitting: Physician Assistant

## 2019-11-13 ENCOUNTER — Other Ambulatory Visit: Payer: Self-pay

## 2019-11-13 VITALS — BP 114/60 | HR 129 | Ht 66.0 in | Wt 112.2 lb

## 2019-11-13 DIAGNOSIS — I2510 Atherosclerotic heart disease of native coronary artery without angina pectoris: Secondary | ICD-10-CM | POA: Diagnosis not present

## 2019-11-13 DIAGNOSIS — I4892 Unspecified atrial flutter: Secondary | ICD-10-CM | POA: Insufficient documentation

## 2019-11-13 DIAGNOSIS — K219 Gastro-esophageal reflux disease without esophagitis: Secondary | ICD-10-CM | POA: Diagnosis not present

## 2019-11-13 DIAGNOSIS — I255 Ischemic cardiomyopathy: Secondary | ICD-10-CM | POA: Insufficient documentation

## 2019-11-13 DIAGNOSIS — Z79899 Other long term (current) drug therapy: Secondary | ICD-10-CM | POA: Insufficient documentation

## 2019-11-13 DIAGNOSIS — G4733 Obstructive sleep apnea (adult) (pediatric): Secondary | ICD-10-CM | POA: Insufficient documentation

## 2019-11-13 DIAGNOSIS — Z87891 Personal history of nicotine dependence: Secondary | ICD-10-CM | POA: Insufficient documentation

## 2019-11-13 DIAGNOSIS — Z7901 Long term (current) use of anticoagulants: Secondary | ICD-10-CM | POA: Insufficient documentation

## 2019-11-13 DIAGNOSIS — K91840 Postprocedural hemorrhage and hematoma of a digestive system organ or structure following a digestive system procedure: Secondary | ICD-10-CM | POA: Diagnosis not present

## 2019-11-13 DIAGNOSIS — I4819 Other persistent atrial fibrillation: Secondary | ICD-10-CM | POA: Diagnosis not present

## 2019-11-13 DIAGNOSIS — D6869 Other thrombophilia: Secondary | ICD-10-CM | POA: Insufficient documentation

## 2019-11-13 DIAGNOSIS — Z955 Presence of coronary angioplasty implant and graft: Secondary | ICD-10-CM | POA: Insufficient documentation

## 2019-11-13 DIAGNOSIS — Z8673 Personal history of transient ischemic attack (TIA), and cerebral infarction without residual deficits: Secondary | ICD-10-CM | POA: Insufficient documentation

## 2019-11-13 DIAGNOSIS — I252 Old myocardial infarction: Secondary | ICD-10-CM | POA: Insufficient documentation

## 2019-11-13 DIAGNOSIS — J449 Chronic obstructive pulmonary disease, unspecified: Secondary | ICD-10-CM | POA: Insufficient documentation

## 2019-11-13 DIAGNOSIS — Z951 Presence of aortocoronary bypass graft: Secondary | ICD-10-CM | POA: Insufficient documentation

## 2019-11-13 DIAGNOSIS — Z8249 Family history of ischemic heart disease and other diseases of the circulatory system: Secondary | ICD-10-CM | POA: Insufficient documentation

## 2019-11-13 DIAGNOSIS — I5032 Chronic diastolic (congestive) heart failure: Secondary | ICD-10-CM | POA: Insufficient documentation

## 2019-11-13 DIAGNOSIS — I110 Hypertensive heart disease with heart failure: Secondary | ICD-10-CM | POA: Diagnosis not present

## 2019-11-13 DIAGNOSIS — Z885 Allergy status to narcotic agent status: Secondary | ICD-10-CM | POA: Diagnosis not present

## 2019-11-13 DIAGNOSIS — I484 Atypical atrial flutter: Secondary | ICD-10-CM | POA: Diagnosis not present

## 2019-11-13 DIAGNOSIS — E785 Hyperlipidemia, unspecified: Secondary | ICD-10-CM | POA: Insufficient documentation

## 2019-11-13 LAB — BASIC METABOLIC PANEL
Anion gap: 12 (ref 5–15)
BUN: 33 mg/dL — ABNORMAL HIGH (ref 8–23)
CO2: 25 mmol/L (ref 22–32)
Calcium: 10.3 mg/dL (ref 8.9–10.3)
Chloride: 96 mmol/L — ABNORMAL LOW (ref 98–111)
Creatinine, Ser: 1.32 mg/dL — ABNORMAL HIGH (ref 0.61–1.24)
GFR calc Af Amer: 58 mL/min — ABNORMAL LOW (ref >60)
GFR calc non Af Amer: 50 mL/min — ABNORMAL LOW (ref >60)
Glucose, Bld: 97 mg/dL (ref 70–99)
Potassium: 5.1 mmol/L (ref 3.5–5.1)
Sodium: 133 mmol/L — ABNORMAL LOW (ref 135–145)

## 2019-11-13 LAB — CBC
HCT: 42.9 % (ref 39.0–52.0)
Hemoglobin: 13.9 g/dL (ref 13.0–17.0)
MCH: 31.7 pg (ref 26.0–34.0)
MCHC: 32.4 g/dL (ref 30.0–36.0)
MCV: 97.9 fL (ref 80.0–100.0)
Platelets: 257 10*3/uL (ref 150–400)
RBC: 4.38 MIL/uL (ref 4.22–5.81)
RDW: 13.2 % (ref 11.5–15.5)
WBC: 7.6 10*3/uL (ref 4.0–10.5)
nRBC: 0 % (ref 0.0–0.2)

## 2019-11-13 MED ORDER — NEBIVOLOL HCL 10 MG PO TABS: 10.0000 mg | ORAL_TABLET | Freq: Every day | ORAL | 1 refills | Status: DC

## 2019-11-13 NOTE — Progress Notes (Signed)
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 The patient was initially diagnosed with atrial fibrillation several years ago and is s/p afib ablation 06/25/14 at [facility] by Dr. Lehman Prom and has been maintained on Tikosyn since. He has had some possible a fib, atrial flutter vs ectopic atrial rhythm. On 12/07/18, he was noted to be in an ectopic atrial rhythm with 2:1 block and was cardioverted on 12/11/18. On review of his ECGs, it appears he had some transient AV dissociation post cardioversion but is back in SR on follow up. He has been asymptomatic. He denies bleeding issues with anticoagulation. He has OSA and struggles with using his CPAP. He is now on an oral device and follows with Dr Radford Pax and Dr Toy Cookey.  Patient is s/p repeat afib ablation 06/08/19 with Dr Curt Bears.   On follow up today, patient was unfortunately, recently admitted 10/24-10/26/20 with acute CVA after holding anticoagulation for oral surgery. He is now on Eliquis for a CHADS2VASC score of 6. Patient reports that two days ago, he started having symptoms of generalized weakness, SOB, and lightheadedness. He checked his HR on his BP machine and it was elevated >100bpm. Patient states he has not been on his oral device for his OSA due to his oral surgeries since 10/16/19.  Today, he denies symptoms of chest pain, orthopnea, PND, lower extremity edema, presyncope, syncope, bleeding, or neurologic sequela. The patient is tolerating medications without difficulties and is otherwise without complaint today.    Atrial Fibrillation Risk Factors:  he does have symptoms or diagnosis of sleep apnea. he is compliant with OSA therapy.  Now on oral appliance.    he has a BMI of Body mass index is 18.11 kg/m.[name] Filed Weights   11/13/19 1623  Weight: 50.9 kg     Atrial Fibrillation Management history:  Previous antiarrhythmic drugs: Tikosyn (avoid amio given abn PFTs) Previous cardioversions: 12/11/18 Previous ablations: 2015 at [facility], 06/08/19 CHADS2VASC score: 6 (age, HTN, CAD, CVA) Anticoagulation history: Xarelto, Eliquis   Past Medical History:  Diagnosis Date  . Abnormal nuclear cardiac imaging test March 2011   Has positive EKG response, no perfusion defect and normal EF  . Adenomatous colon polyp 12/2002  . Arrhythmia   . Atrial fibrillation ([place])    a. s/p rfca;  b. chronic tikosyn and xarelto.  . 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 Cataract    bil cataracts removed  . Chronic diastolic CHF (congestive heart failure) ([place])    a. 04/2017 Echo: EF 65-70%, restrictive physiology, mildly dil LA.  [name] Complication of anesthesia   . COPD (chronic obstructive pulmonary disease) ([place])   . Coronary artery disease    First MI in 1988. PCI in 1996 with subsequent CABG in 1996 due to restenosis. S/P stents to LCX in 2009. Noted to have residual disease in the LAD in a diffuse manner and atretic LIMA graft. He is managed medically.   . Difficult intubation    06/08/19 update - Intubated with MAC3 with grade IIb view 7.0 tube passed easily  . Dysrhythmia    Afib  . Esophageal stricture   . GERD (gastroesophageal reflux disease)   . 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 History of Raynaud's syndrome   . Hyperlipidemia   . Hypertension   . Internal hemorrhoids   . Muscle spasm   . Myocardial infarction [facility])    MI x1 78 - 13 age  . Neuromuscular disorder ([place])    Polio - age 65  . OSA (obstructive sleep apnea) 10/16/2018   Severe OSA with AHI 37.2/hr on BiPAP  . Persistent atrial fibrillation ([place])   . Polio    age 63  . PVC (premature  ventricular contraction)   . Scoliosis    mild   Past Surgical History:  Procedure Laterality Date  . ABLATION     done for a fib  . APPENDECTOMY     Age 3  . ATRIAL FIBRILLATION ABLATION N/A 06/08/2019   Procedure: ATRIAL FIBRILLATION ABLATION;  Surgeon: Constance Haw, MD;  Location: [facility];  Service: Cardiovascular;  Laterality: N/A;  . CARDIAC CATHETERIZATION    . CARDIOVERSION N/A 08/22/2013   Procedure: CARDIOVERSION;  Surgeon: Carlena Bjornstad, MD;  Location: [facility] ENDOSCOPY;  Service: Cardiovascular;  Laterality: N/A;  . CARDIOVERSION N/A 01/16/2014   Procedure: CARDIOVERSION;  Surgeon: Lelon Perla, MD;  Location: [facility] ENDOSCOPY;  Service: Cardiovascular;  Laterality: N/A;  . CARDIOVERSION N/A 12/11/2018   Procedure: CARDIOVERSION;  Surgeon: Larey Dresser, MD;  Location: [facility] ENDOSCOPY;  Service: Cardiovascular;  Laterality: N/A;  . COLONOSCOPY  2008  . CORONARY ARTERY BYPASS GRAFT  1996   with a LIMA to the LAD, SVG to RCA and SVG to OM  . CORONARY STENT INTERVENTION N/A 03/26/2019   Procedure: CORONARY STENT INTERVENTION;  Surgeon: Jettie Booze, MD;  Location: [place] [facility];  Service: Cardiovascular;  Laterality: N/A;  . CORONARY STENT PLACEMENT  1996   LCX  . EMBOLECTOMY Right 05/09/2019   Procedure: Embolectomy of right radial artery;  Surgeon: Serafina Mitchell, MD;  Location: [facility];  Service: Vascular;  Laterality: Right;  . ESOPHAGEAL DILATION    . EYE SURGERY    . FALSE ANEURYSM REPAIR Right 05/09/2019   Procedure: REPAIR FALSE ANEURYSM RIGHT RADIAL;  Surgeon: Serafina Mitchell, MD;  Location: [facility];  Service: Vascular;  Laterality: Right;  . LEFT HEART CATH AND CORS/GRAFTS ANGIOGRAPHY N/A 03/26/2019   Procedure: LEFT HEART CATH AND CORS/GRAFTS ANGIOGRAPHY;  Surgeon: Larey Dresser, MD;  Location: [facility];  Service: Cardiovascular;  Laterality: N/A;  . POLYPECTOMY    . TONSILLECTOMY    . UPPER GASTROINTESTINAL ENDOSCOPY      esophegeal dilation    Current Outpatient Medications  Medication Sig Dispense Refill  . acetaminophen (TYLENOL) 500 MG tablet Take 500 mg by mouth every 8 (eight) hours as needed for mild pain or moderate pain.     [name] albuterol (PROAIR HFA) 108 (90 BASE) MCG/ACT inhaler Inhale 2 puffs into the lungs every 4 (four) hours as needed for wheezing or shortness of breath (and prior to exercise). 1 Inhaler 3  . AMBULATORY NON FORMULARY MEDICATION Apply 1 application topically See admin instructions. Testosterone cream 150mg /15% Place 1 ml  onto the skin daily    . apixaban (ELIQUIS) 2.5 MG TABS tablet Take 1 tablet (2.5 mg total) by mouth 2 (two) times daily. 60 tablet 0  . cefdinir (OMNICEF) 300 MG capsule Take 300 mg by mouth 2 (two) times daily.    . clopidogrel (PLAVIX) 75 MG tablet TAKE 1 TABLET (75 MG TOTAL) BY  MOUTH DAILY WITH BREAKFAST. 90 tablet 2  . dicyclomine (BENTYL) 10 MG capsule Take 10 mg by mouth 3 (three) times daily as needed for spasms.    [name] dofetilide (TIKOSYN) 250 MCG capsule TAKE 1 CAPSULE BY MOUTH EVERY 12 HOURS *NEED OFFICE VISIT* 180 capsule 3  . ezetimibe (ZETIA) 10 MG tablet TAKE 1 TABLET BY MOUTH EVERY DAY (Patient taking differently: Take 10 mg by mouth daily. ) 90 tablet 3  . famotidine (PEPCID) 40 MG tablet Take 1 tablet (40 mg total) by mouth at bedtime. 30 tablet 11  . furosemide (LASIX) 40 MG tablet Take 0.5 tablets (20 mg total) by mouth daily. 15 tablet 6  . HYDROcodone-acetaminophen (NORCO/VICODIN) 5-325 MG tablet Take 1 tablet by mouth every 6 (six) hours as needed (For mouth pain).    [name] lactobacillus acidophilus & bulgar (LACTINEX) chewable tablet Chew 2 tablets by mouth daily. (0800)    . loperamide (IMODIUM) 2 MG capsule Take 2 mg by mouth 2 (two) times daily as needed for diarrhea or loose stools. One or two tablets daily as needed    . LORazepam (ATIVAN) 0.5 MG tablet Take 0.5 mg by mouth at bedtime.     . magnesium oxide (MAG-OX) 400 MG tablet Take 400 mg by  mouth daily.    . methocarbamol (ROBAXIN) 500 MG tablet Take 500 mg by mouth 2 (two) times daily.     . nebivolol (BYSTOLIC) 10 MG tablet Take 0.5 tablets (5 mg total) by mouth daily. 90 tablet 1  . nitroGLYCERIN (NITROSTAT) 0.4 MG SL tablet Place 1 tablet (0.4 mg total) under the tongue every 5 (five) minutes as needed for chest pain. 25 tablet 0  . potassium chloride SA (K-DUR) 20 MEQ tablet Take 1 tablet (20 mEq total) by mouth at bedtime. 90 tablet 3  . Probiotic Product (ALIGN) 4 MG CAPS Take 4 mg by mouth daily.    . rosuvastatin (CRESTOR) 40 MG tablet TAKE 1 TABLET BY MOUTH EVERY DAY (Patient taking differently: Take 40 mg by mouth daily. ) 90 tablet 3  . sodium chloride (OCEAN) 0.65 % SOLN nasal spray Place 1 spray into both nostrils as needed for congestion.    . traZODone (DESYREL) 50 MG tablet Take 50 mg by mouth at bedtime.    . Vitamin D, Ergocalciferol, (DRISDOL) 50000 UNITS CAPS capsule Take 50,000 Units by mouth every 14 (fourteen) days. Take on 1st and 15th     No current facility-administered medications for this encounter.     Allergies  Allergen Reactions  . Morphine And Related Other (See Comments)    IV forms- vein irritation  . Tape Other (See Comments)    Skin Irritation    Social History   Socioeconomic History  . Marital status: Married    Spouse name: Inez Catalina  . Number of children: 2  . Years of education: Not on file  . Highest education level: Not on file  Occupational History  . Occupation: Software engineer  Social Needs  . Financial resource strain: Not hard at all  . Food insecurity    Worry: Never true    Inability: Never true  . Transportation needs    Medical: No    Non-medical: No  Tobacco Use  . Smoking status: Former Smoker    Packs/day: 0.50    Years: 5.00    Pack years: 2.50    Types: Cigarettes    Quit date: 12/27/1966    Years since quitting: 52.9  . Smokeless tobacco:  Former Systems developer    Quit date: 1950  Substance and Sexual Activity  .  Alcohol use: No  . Drug use: No  . Sexual activity: Yes  Lifestyle  . Physical activity    Days per week: 0 days    Minutes per session: 0 min  . Stress: To some extent  Relationships  . Social Herbalist on phone: Not on file    Gets together: Not on file    Attends religious service: Not on file    Active member of club or organization: Not on file    Attends meetings of clubs or organizations: Not on file    Relationship status: Not on file  . Intimate partner violence    Fear of current or ex partner: Not on file    Emotionally abused: Not on file    Physically abused: Not on file    Forced sexual activity: Not on file  Other Topics Concern  . 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 Heart disease Mother   . Heart disease Father   . Heart Problems Brother        heart transplant  . Heart disease Maternal Grandmother   . Heart disease Maternal Grandfather   . Heart disease Paternal Grandmother   . Heart disease Paternal Grandfather   . Prostate cancer Paternal Uncle   . Cancer Son        thymic cancer.  died at age 35  . Colon cancer Neg Hx   . Esophageal cancer Neg Hx   . Pancreatic cancer Neg Hx   . Rectal cancer Neg Hx   . Stomach cancer Neg Hx     ROS- All systems are reviewed and negative except as per the HPI above.  Physical Exam: Vitals:   11/13/19 1623  Weight: 50.9 kg  Height: 5\' 6"  (1.676 m)    GEN- The patient is well appearing elderly male, alert and oriented x 3 today.   HEENT-head normocephalic, atraumatic, sclera clear, conjunctiva pink, hearing intact, trachea midline. Lungs- Clear to ausculation bilaterally, normal work of breathing Heart- iregular rhythm, tachycardia, no murmurs, rubs or gallops  GI- soft, NT, ND, + BS Extremities- no clubbing, cyanosis, or edema MS- no significant  deformity or atrophy Skin- no rash or lesion Psych- euthymic mood, full affect Neuro- strength and sensation are intact   Wt Readings from Last 3 Encounters:  11/13/19 50.9 kg  10/21/19 54.1 kg  10/20/19 49.9 kg    EKG today demonstrates atypical atrial flutter HR 129, QRS 98, QTc 477  Echo 03/26/19 demonstrated   1. The left ventricle has mildly reduced systolic function, with an ejection fraction of 45-50%. The cavity size was mildly dilated. Left ventricular diastolic Doppler parameters are indeterminate.  2. Posterior lateral and inferior basal hypokinesis.  3. The right ventricle has normal systolic function. The cavity was normal. There is no increase in right ventricular wall thickness.  4. Left atrial size was mildly dilated.  5. Right atrial size was mildly dilated.  6. The mitral valve is degenerative. Moderate thickening of the mitral valve leaflet. Mild calcification of the mitral valve leaflet. There is mild mitral annular calcification present.  7. Tricuspid valve regurgitation is mild-moderate.  8. The aortic valve is tricuspid. Moderate thickening of the aortic  valve. Moderate sclerosis of the aortic valve.  Epic records reviewed.   Assessment and Plan:  1. Persistent atrial fibrillation/atypical atrial flutter S/p repeat afib ablation with Dr Curt Bears 06/08/19. Per procedure report, flutter unable to be ablated due to complexity of atrial flutter. Patient back in rapid flutter today.  Will arrange DCCV. Patient reports no missed doses of anticoagulation in the last 3 weeks. Will increase bystolic to 10 mg daily. Decrease back to 5 mg daily the morning of DCCV. Continue Eliquis 2.5 mg BID Continue dofetilide 250 mcg BID. QT stable Check bmet/CBC ER precautions given. We discussed the possibility that he is failing Tikosyn. He does not have other options for AAD. He may required AV node ablation.  This patients CHA2DS2-VASc Score and unadjusted Ischemic Stroke Rate  (% per year) is equal to 9.7 % stroke rate/year from a score of 6  Above score calculated as 1 point each if present [CHF, HTN, DM, Vascular=MI/PAD/Aortic Plaque, Age if 65-74, or Male] Above score calculated as 2 points each if present [Age > 75, or Stroke/TIA/TE]   2. Obstructive sleep apnea The importance of adequate treatment of sleep apnea was discussed today in order to improve our ability to maintain sinus rhythm long term. Followed by Dr Toy Cookey.   3. CAD No anginal symptoms. Followed by Dr Aundra Dubin.   4. HTN Stable, no changes today.  5. Ischemic CM No signs or symptoms of fluid overload.    Follow up with AF clinic one week post DCCV. Dr Curt Bears as scheduled.    [facility] [address] [place], [place] [address] [phone]

## 2019-11-13 NOTE — H&P (View-Only) (Signed)
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 The patient was initially diagnosed with atrial fibrillation several years ago and is s/p afib ablation 06/25/14 at [facility] by Dr. Lehman Prom and has been maintained on Tikosyn since. He has had some possible a fib, atrial flutter vs ectopic atrial rhythm. On 12/07/18, he was noted to be in an ectopic atrial rhythm with 2:1 block and was cardioverted on 12/11/18. On review of his ECGs, it appears he had some transient AV dissociation post cardioversion but is back in SR on follow up. He has been asymptomatic. He denies bleeding issues with anticoagulation. He has OSA and struggles with using his CPAP. He is now on an oral device and follows with Dr Radford Pax and Dr Toy Cookey.  Patient is s/p repeat afib ablation 06/08/19 with Dr Curt Bears.   On follow up today, patient was unfortunately, recently admitted 10/24-10/26/20 with acute CVA after holding anticoagulation for oral surgery. He is now on Eliquis for a CHADS2VASC score of 6. Patient reports that two days ago, he started having symptoms of generalized weakness, SOB, and lightheadedness. He checked his HR on his BP machine and it was elevated >100bpm. Patient states he has not been on his oral device for his OSA due to his oral surgeries since 10/16/19.  Today, he denies symptoms of chest pain, orthopnea, PND, lower extremity edema, presyncope, syncope, bleeding, or neurologic sequela. The patient is tolerating medications without difficulties and is otherwise without complaint today.    Atrial Fibrillation Risk Factors:  he does have symptoms or diagnosis of sleep apnea. he is compliant with OSA therapy.  Now on oral appliance.    he has a BMI of Body mass index is 18.11 kg/m.[name] Filed Weights   11/13/19 1623  Weight: 50.9 kg     Atrial Fibrillation Management history:  Previous antiarrhythmic drugs: Tikosyn (avoid amio given abn PFTs) Previous cardioversions: 12/11/18 Previous ablations: 2015 at [facility], 06/08/19 CHADS2VASC score: 6 (age, HTN, CAD, CVA) Anticoagulation history: Xarelto, Eliquis   Past Medical History:  Diagnosis Date  . Abnormal nuclear cardiac imaging test March 2011   Has positive EKG response, no perfusion defect and normal EF  . Adenomatous colon polyp 12/2002  . Arrhythmia   . Atrial fibrillation ([place])    a. s/p rfca;  b. chronic tikosyn and xarelto.  . 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 Cataract    bil cataracts removed  . Chronic diastolic CHF (congestive heart failure) ([place])    a. 04/2017 Echo: EF 65-70%, restrictive physiology, mildly dil LA.  [name] Complication of anesthesia   . COPD (chronic obstructive pulmonary disease) ([place])   . Coronary artery disease    First MI in 1988. PCI in 1996 with subsequent CABG in 1996 due to restenosis. S/P stents to LCX in 2009. Noted to have residual disease in the LAD in a diffuse manner and atretic LIMA graft. He is managed medically.   . Difficult intubation    06/08/19 update - Intubated with MAC3 with grade IIb view 7.0 tube passed easily  . Dysrhythmia    Afib  . Esophageal stricture   . GERD (gastroesophageal reflux disease)   . 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 History of Raynaud's syndrome   . Hyperlipidemia   . Hypertension   . Internal hemorrhoids   . Muscle spasm   . Myocardial infarction [facility])    MI x1 30 - 86 age  . Neuromuscular disorder ([place])    Polio - age 47  . OSA (obstructive sleep apnea) 10/16/2018   Severe OSA with AHI 37.2/hr on BiPAP  . Persistent atrial fibrillation ([place])   . Polio    age 38  . PVC (premature  ventricular contraction)   . Scoliosis    mild   Past Surgical History:  Procedure Laterality Date  . ABLATION     done for a fib  . APPENDECTOMY     Age 10  . ATRIAL FIBRILLATION ABLATION N/A 06/08/2019   Procedure: ATRIAL FIBRILLATION ABLATION;  Surgeon: Constance Haw, MD;  Location: [place] [facility];  Service: Cardiovascular;  Laterality: N/A;  . CARDIAC CATHETERIZATION    . CARDIOVERSION N/A 08/22/2013   Procedure: CARDIOVERSION;  Surgeon: Carlena Bjornstad, MD;  Location: [facility] END[facility];  Service: Cardiovascular;  Laterality: N/A;  . CARDIOVERSION N/A 01/16/2014   Procedure: CARDIOVERSION;  Surgeon: Lelon Perla, MD;  Location: [facility];  Service: Cardiovascular;  Laterality: N/A;  . CARDIOVERSION N/A 12/11/2018   Procedure: CARDIOVERSION;  Surgeon: Larey Dresser, MD;  Location: [facility];  Service: Cardiovascular;  Laterality: N/A;  . COLONOSCOPY  2008  . CORONARY ARTERY BYPASS GRAFT  1996   with a LIMA to the LAD, SVG to RCA and SVG to OM  . CORONARY STENT INTERVENTION N/A 03/26/2019   Procedure: CORONARY STENT INTERVENTION;  Surgeon: Jettie Booze, MD;  Location: [place] [facility];  Service: Cardiovascular;  Laterality: N/A;  . CORONARY STENT PLACEMENT  1996   LCX  . EMBOLECTOMY Right 05/09/2019   Procedure: Embolectomy of right radial artery;  Surgeon: Serafina Mitchell, MD;  Location: [place];  Service: Vascular;  Laterality: Right;  . ESOPHAGEAL DILATION    . EYE SURGERY    . FALSE ANEURYSM REPAIR Right 05/09/2019   Procedure: REPAIR FALSE ANEURYSM RIGHT RADIAL;  Surgeon: Serafina Mitchell, MD;  Location: [place];  Service: Vascular;  Laterality: Right;  . LEFT HEART CATH AND CORS/GRAFTS ANGIOGRAPHY N/A 03/26/2019   Procedure: LEFT HEART CATH AND CORS/GRAFTS ANGIOGRAPHY;  Surgeon: Larey Dresser, MD;  [place]: [place] [facility];  Service: Cardiovascular;  Laterality: N/A;  . POLYPECTOMY    . TONSILLECTOMY    . UPPER GASTROINTESTINAL ENDOSCOPY      esophegeal dilation    Current Outpatient Medications  Medication Sig Dispense Refill  . acetaminophen (TYLENOL) 500 MG tablet Take 500 mg by mouth every 8 (eight) hours as needed for mild pain or moderate pain.     [name] albuterol (PROAIR HFA) 108 (90 BASE) MCG/ACT inhaler Inhale 2 puffs into the lungs every 4 (four) hours as needed for wheezing or shortness of breath (and prior to exercise). 1 Inhaler 3  . AMBULATORY NON FORMULARY MEDICATION Apply 1 application topically See admin instructions. Testosterone cream 150mg /15% Place 1 ml  onto the skin daily    . apixaban (ELIQUIS) 2.5 MG TABS tablet Take 1 tablet (2.5 mg total) by mouth 2 (two) times daily. 60 tablet 0  . cefdinir (OMNICEF) 300 MG capsule Take 300 mg by mouth 2 (two) times daily.    . clopidogrel (PLAVIX) 75 MG tablet TAKE 1 TABLET (75 MG TOTAL) BY  MOUTH DAILY WITH BREAKFAST. 90 tablet 2  . dicyclomine (BENTYL) 10 MG capsule Take 10 mg by mouth 3 (three) times daily as needed for spasms.    [name] dofetilide (TIKOSYN) 250 MCG capsule TAKE 1 CAPSULE BY MOUTH EVERY 12 HOURS *NEED OFFICE VISIT* 180 capsule 3  . ezetimibe (ZETIA) 10 MG tablet TAKE 1 TABLET BY MOUTH EVERY DAY (Patient taking differently: Take 10 mg by mouth daily. ) 90 tablet 3  . famotidine (PEPCID) 40 MG tablet Take 1 tablet (40 mg total) by mouth at bedtime. 30 tablet 11  . furosemide (LASIX) 40 MG tablet Take 0.5 tablets (20 mg total) by mouth daily. 15 tablet 6  . HYDROcodone-acetaminophen (NORCO/VICODIN) 5-325 MG tablet Take 1 tablet by mouth every 6 (six) hours as needed (For mouth pain).    [name] lactobacillus acidophilus & bulgar (LACTINEX) chewable tablet Chew 2 tablets by mouth daily. (0800)    . loperamide (IMODIUM) 2 MG capsule Take 2 mg by mouth 2 (two) times daily as needed for diarrhea or loose stools. One or two tablets daily as needed    . LORazepam (ATIVAN) 0.5 MG tablet Take 0.5 mg by mouth at bedtime.     . magnesium oxide (MAG-OX) 400 MG tablet Take 400 mg by  mouth daily.    . methocarbamol (ROBAXIN) 500 MG tablet Take 500 mg by mouth 2 (two) times daily.     . nebivolol (BYSTOLIC) 10 MG tablet Take 0.5 tablets (5 mg total) by mouth daily. 90 tablet 1  . nitroGLYCERIN (NITROSTAT) 0.4 MG SL tablet Place 1 tablet (0.4 mg total) under the tongue every 5 (five) minutes as needed for chest pain. 25 tablet 0  . potassium chloride SA (K-DUR) 20 MEQ tablet Take 1 tablet (20 mEq total) by mouth at bedtime. 90 tablet 3  . Probiotic Product (ALIGN) 4 MG CAPS Take 4 mg by mouth daily.    . rosuvastatin (CRESTOR) 40 MG tablet TAKE 1 TABLET BY MOUTH EVERY DAY (Patient taking differently: Take 40 mg by mouth daily. ) 90 tablet 3  . sodium chloride (OCEAN) 0.65 % SOLN nasal spray Place 1 spray into both nostrils as needed for congestion.    . traZODone (DESYREL) 50 MG tablet Take 50 mg by mouth at bedtime.    . Vitamin D, Ergocalciferol, (DRISDOL) 50000 UNITS CAPS capsule Take 50,000 Units by mouth every 14 (fourteen) days. Take on 1st and 15th     No current facility-administered medications for this encounter.     Allergies  Allergen Reactions  . Morphine And Related Other (See Comments)    IV forms- vein irritation  . Tape Other (See Comments)    Skin Irritation    Social History   Socioeconomic History  . Marital status: Married    Spouse name: Inez Catalina  . Number of children: 2  . Years of education: Not on file  . Highest education level: Not on file  Occupational History  . Occupation: Software engineer  Social Needs  . Financial resource strain: Not hard at all  . Food insecurity    Worry: Never true    Inability: Never true  . Transportation needs    Medical: No    Non-medical: No  Tobacco Use  . Smoking status: Former Smoker    Packs/day: 0.50    Years: 5.00    Pack years: 2.50    Types: Cigarettes    Quit date: 12/27/1966    Years since quitting: 52.9  . Smokeless tobacco:  Former Systems developer    Quit date: 1950  Substance and Sexual Activity  .  Alcohol use: No  . Drug use: No  . Sexual activity: Yes  Lifestyle  . Physical activity    Days per week: 0 days    Minutes per session: 0 min  . Stress: To some extent  Relationships  . Social Herbalist on phone: Not on file    Gets together: Not on file    Attends religious service: Not on file    Active member of club or organization: Not on file    Attends meetings of clubs or organizations: Not on file    Relationship status: Not on file  . Intimate partner violence    Fear of current or ex partner: Not on file    Emotionally abused: Not on file    Physically abused: Not on file    Forced sexual activity: Not on file  Other Topics Concern  . 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 Heart disease Mother   . Heart disease Father   . Heart Problems Brother        heart transplant  . Heart disease Maternal Grandmother   . Heart disease Maternal Grandfather   . Heart disease Paternal Grandmother   . Heart disease Paternal Grandfather   . Prostate cancer Paternal Uncle   . Cancer Son        thymic cancer.  died at age 70  . Colon cancer Neg Hx   . Esophageal cancer Neg Hx   . Pancreatic cancer Neg Hx   . Rectal cancer Neg Hx   . Stomach cancer Neg Hx     ROS- All systems are reviewed and negative except as per the HPI above.  Physical Exam: Vitals:   11/13/19 1623  Weight: 50.9 kg  Height: 5\' 6"  (1.676 m)    GEN- The patient is well appearing elderly male, alert and oriented x 3 today.   HEENT-head normocephalic, atraumatic, sclera clear, conjunctiva pink, hearing intact, trachea midline. Lungs- Clear to ausculation bilaterally, normal work of breathing Heart- iregular rhythm, tachycardia, no murmurs, rubs or gallops  GI- soft, NT, ND, + BS Extremities- no clubbing, cyanosis, or edema MS- no significant  deformity or atrophy Skin- no rash or lesion Psych- euthymic mood, full affect Neuro- strength and sensation are intact   Wt Readings from Last 3 Encounters:  11/13/19 50.9 kg  10/21/19 54.1 kg  10/20/19 49.9 kg    EKG today demonstrates atypical atrial flutter HR 129, QRS 98, QTc 477  Echo 03/26/19 demonstrated   1. The left ventricle has mildly reduced systolic function, with an ejection fraction of 45-50%. The cavity size was mildly dilated. Left ventricular diastolic Doppler parameters are indeterminate.  2. Posterior lateral and inferior basal hypokinesis.  3. The right ventricle has normal systolic function. The cavity was normal. There is no increase in right ventricular wall thickness.  4. Left atrial size was mildly dilated.  5. Right atrial size was mildly dilated.  6. The mitral valve is degenerative. Moderate thickening of the mitral valve leaflet. Mild calcification of the mitral valve leaflet. There is mild mitral annular calcification present.  7. Tricuspid valve regurgitation is mild-moderate.  8. The aortic valve is tricuspid. Moderate thickening of the aortic  valve. Moderate sclerosis of the aortic valve.  Epic records reviewed.   Assessment and Plan:  1. Persistent atrial fibrillation/atypical atrial flutter S/p repeat afib ablation with Dr Curt Bears 06/08/19. Per procedure report, flutter unable to be ablated due to complexity of atrial flutter. Patient back in rapid flutter today.  Will arrange DCCV. Patient reports no missed doses of anticoagulation in the last 3 weeks. Will increase bystolic to 10 mg daily. Decrease back to 5 mg daily the morning of DCCV. Continue Eliquis 2.5 mg BID Continue dofetilide 250 mcg BID. QT stable Check bmet/CBC ER precautions given. We discussed the possibility that he is failing Tikosyn. He does not have other options for AAD. He may required AV node ablation.  This patients CHA2DS2-VASc Score and unadjusted Ischemic Stroke Rate  (% per year) is equal to 9.7 % stroke rate/year from a score of 6  Above score calculated as 1 point each if present [CHF, HTN, DM, Vascular=MI/PAD/Aortic Plaque, Age if 65-74, or Male] Above score calculated as 2 points each if present [Age > 75, or Stroke/TIA/TE]   2. Obstructive sleep apnea The importance of adequate treatment of sleep apnea was discussed today in order to improve our ability to maintain sinus rhythm long term. Followed by Dr Toy Cookey.   3. CAD No anginal symptoms. Followed by Dr Aundra Dubin.   4. HTN Stable, no changes today.  5. Ischemic CM No signs or symptoms of fluid overload.    Follow up with AF clinic one week post DCCV. Dr Curt Bears as scheduled.    [name] [address] [place], [place] [address] [phone]

## 2019-11-13 NOTE — Patient Instructions (Signed)
Increase bystolic to 10mg  once a day until day of cardioversion then reduce back to 5mg  a day  I will call you tomorrow with date and time of cardioversion and covid testing.  Cardioversion scheduled for  - Arrive at the [facility] and go to admitting at  -Do not eat or drink anything after midnight the night prior to your procedure.  - Take all your morning medication with a sip of water prior to arrival.  - You will not be able to drive home after your procedure.  - Do not miss any doses of your eliquis. If you should miss a dose please notify our office.

## 2019-11-13 NOTE — Telephone Encounter (Signed)
Message lef ton triage line with reports of increased SOB decreased energy, irregular HR, light headedness, decrease b/p by patients wife   Attempted to return call to further assess.   No answer LMOM

## 2019-11-14 ENCOUNTER — Other Ambulatory Visit (HOSPITAL_COMMUNITY): Payer: Self-pay | Admitting: *Deleted

## 2019-11-15 ENCOUNTER — Ambulatory Visit: Payer: Medicare Other | Admitting: Occupational Therapy

## 2019-11-15 NOTE — Telephone Encounter (Signed)
Patient presented to office and was seen by Afib clinic as Dr Aundra Dubin was not available  DCCv is scheduled for 11/24

## 2019-11-16 ENCOUNTER — Other Ambulatory Visit (HOSPITAL_COMMUNITY): Payer: Self-pay | Admitting: *Deleted

## 2019-11-16 ENCOUNTER — Other Ambulatory Visit (HOSPITAL_COMMUNITY)
Admission: RE | Admit: 2019-11-16 | Discharge: 2019-11-16 | Disposition: A | Discharge status: Home / Self Care | Payer: Medicare Other | Source: Ambulatory Visit | Attending: Cardiology | Admitting: Cardiology

## 2019-11-16 DIAGNOSIS — Z01812 Encounter for preprocedural laboratory examination: Secondary | ICD-10-CM | POA: Insufficient documentation

## 2019-11-16 DIAGNOSIS — Z20828 Contact with and (suspected) exposure to other viral communicable diseases: Secondary | ICD-10-CM | POA: Insufficient documentation

## 2019-11-16 MED ORDER — APIXABAN 2.5 MG PO TABS: 2.5000 mg | ORAL_TABLET | Freq: Two times a day (BID) | ORAL | 1 refills | Status: DC

## 2019-11-18 LAB — NOVEL CORONAVIRUS, NAA (HOSP ORDER, SEND-OUT TO REF LAB; TAT 18-24 HRS): SARS-CoV-2, NAA: NOT DETECTED

## 2019-11-20 ENCOUNTER — Ambulatory Visit (HOSPITAL_COMMUNITY): Payer: Medicare Other | Admitting: Certified Registered Nurse Anesthetist

## 2019-11-20 ENCOUNTER — Encounter (HOSPITAL_COMMUNITY)
Admission: RE | Disposition: A | Discharge status: Home / Self Care | Payer: Self-pay | Source: Home / Self Care | Attending: Cardiology

## 2019-11-20 ENCOUNTER — Other Ambulatory Visit: Payer: Self-pay

## 2019-11-20 ENCOUNTER — Ambulatory Visit (HOSPITAL_COMMUNITY)
Admission: RE | Admit: 2019-11-20 | Discharge: 2019-11-20 | Disposition: A | Discharge status: Home / Self Care | Payer: Medicare Other | Attending: Cardiology | Admitting: Cardiology

## 2019-11-20 ENCOUNTER — Encounter (HOSPITAL_COMMUNITY): Payer: Self-pay

## 2019-11-20 DIAGNOSIS — E785 Hyperlipidemia, unspecified: Secondary | ICD-10-CM | POA: Insufficient documentation

## 2019-11-20 DIAGNOSIS — G902 Horner's syndrome: Secondary | ICD-10-CM | POA: Insufficient documentation

## 2019-11-20 DIAGNOSIS — K219 Gastro-esophageal reflux disease without esophagitis: Secondary | ICD-10-CM | POA: Diagnosis not present

## 2019-11-20 DIAGNOSIS — G14 Postpolio syndrome: Secondary | ICD-10-CM | POA: Diagnosis not present

## 2019-11-20 DIAGNOSIS — I5032 Chronic diastolic (congestive) heart failure: Secondary | ICD-10-CM | POA: Diagnosis not present

## 2019-11-20 DIAGNOSIS — Z7901 Long term (current) use of anticoagulants: Secondary | ICD-10-CM | POA: Diagnosis not present

## 2019-11-20 DIAGNOSIS — I69391 Dysphagia following cerebral infarction: Secondary | ICD-10-CM | POA: Diagnosis not present

## 2019-11-20 DIAGNOSIS — I110 Hypertensive heart disease with heart failure: Secondary | ICD-10-CM | POA: Insufficient documentation

## 2019-11-20 DIAGNOSIS — Z885 Allergy status to narcotic agent status: Secondary | ICD-10-CM | POA: Diagnosis not present

## 2019-11-20 DIAGNOSIS — Z792 Long term (current) use of antibiotics: Secondary | ICD-10-CM | POA: Insufficient documentation

## 2019-11-20 DIAGNOSIS — J449 Chronic obstructive pulmonary disease, unspecified: Secondary | ICD-10-CM | POA: Insufficient documentation

## 2019-11-20 DIAGNOSIS — I69398 Other sequelae of cerebral infarction: Secondary | ICD-10-CM | POA: Diagnosis not present

## 2019-11-20 DIAGNOSIS — Z951 Presence of aortocoronary bypass graft: Secondary | ICD-10-CM | POA: Diagnosis not present

## 2019-11-20 DIAGNOSIS — I2510 Atherosclerotic heart disease of native coronary artery without angina pectoris: Secondary | ICD-10-CM | POA: Insufficient documentation

## 2019-11-20 DIAGNOSIS — Z955 Presence of coronary angioplasty implant and graft: Secondary | ICD-10-CM | POA: Insufficient documentation

## 2019-11-20 DIAGNOSIS — I5033 Acute on chronic diastolic (congestive) heart failure: Secondary | ICD-10-CM | POA: Diagnosis not present

## 2019-11-20 DIAGNOSIS — Z87891 Personal history of nicotine dependence: Secondary | ICD-10-CM | POA: Insufficient documentation

## 2019-11-20 DIAGNOSIS — G4733 Obstructive sleep apnea (adult) (pediatric): Secondary | ICD-10-CM | POA: Diagnosis not present

## 2019-11-20 DIAGNOSIS — Z8249 Family history of ischemic heart disease and other diseases of the circulatory system: Secondary | ICD-10-CM | POA: Insufficient documentation

## 2019-11-20 DIAGNOSIS — Z7902 Long term (current) use of antithrombotics/antiplatelets: Secondary | ICD-10-CM | POA: Diagnosis not present

## 2019-11-20 DIAGNOSIS — I4891 Unspecified atrial fibrillation: Secondary | ICD-10-CM | POA: Diagnosis not present

## 2019-11-20 DIAGNOSIS — I4819 Other persistent atrial fibrillation: Secondary | ICD-10-CM | POA: Diagnosis not present

## 2019-11-20 DIAGNOSIS — Z79899 Other long term (current) drug therapy: Secondary | ICD-10-CM | POA: Insufficient documentation

## 2019-11-20 DIAGNOSIS — I484 Atypical atrial flutter: Secondary | ICD-10-CM | POA: Diagnosis not present

## 2019-11-20 DIAGNOSIS — I252 Old myocardial infarction: Secondary | ICD-10-CM | POA: Insufficient documentation

## 2019-11-20 DIAGNOSIS — I4892 Unspecified atrial flutter: Secondary | ICD-10-CM

## 2019-11-20 DIAGNOSIS — I255 Ischemic cardiomyopathy: Secondary | ICD-10-CM | POA: Diagnosis not present

## 2019-11-20 HISTORY — PX: CARDIOVERSION: SHX1299

## 2019-11-20 LAB — POCT I-STAT, CHEM 8
BUN: 37 mg/dL — ABNORMAL HIGH (ref 8–23)
Calcium, Ion: 1.28 mmol/L (ref 1.15–1.40)
Chloride: 104 mmol/L (ref 98–111)
Creatinine, Ser: 1.2 mg/dL (ref 0.61–1.24)
Glucose, Bld: 95 mg/dL (ref 70–99)
HCT: 43 % (ref 39.0–52.0)
Hemoglobin: 14.6 g/dL (ref 13.0–17.0)
Potassium: 4.9 mmol/L (ref 3.5–5.1)
Sodium: 137 mmol/L (ref 135–145)
TCO2: 28 mmol/L (ref 22–32)

## 2019-11-20 SURGERY — CARDIOVERSION
Anesthesia: General

## 2019-11-20 MED ORDER — SODIUM CHLORIDE 0.9 % IV SOLN
INTRAVENOUS | Status: DC | PRN
  Administered 2019-11-20: 14:00:00 via INTRAVENOUS

## 2019-11-20 MED ORDER — PROPOFOL 10 MG/ML IV BOLUS
INTRAVENOUS | Status: DC | PRN
  Administered 2019-11-20: 75 mg via INTRAVENOUS

## 2019-11-20 MED ORDER — LIDOCAINE 2% (20 MG/ML) 5 ML SYRINGE
INTRAMUSCULAR | Status: DC | PRN
  Administered 2019-11-20: 60 mg via INTRAVENOUS

## 2019-11-20 NOTE — Interval H&P Note (Signed)
History and Physical Interval Note:  11/20/2019 2:11 PM  [name]  has presented today for surgery, with the diagnosis of AFIB.  The various methods of treatment have been discussed with the patient and family. After consideration of risks, benefits and other options for treatment, the patient has consented to  Procedure(s): CARDIOVERSION (N/A) as a surgical intervention.  The patient's history has been reviewed, patient examined, no change in status, stable for surgery.  I have reviewed the patient's chart and labs.  Questions were answered to the patient's satisfaction.     [name]

## 2019-11-20 NOTE — Anesthesia Procedure Notes (Signed)
Procedure Name: General with mask airway Date/Time: 11/20/2019 2:13 PM Performed by: Candis Shine, CRNA Pre-anesthesia Checklist: Patient identified, Emergency Drugs available, Suction available, Patient being monitored and Timeout performed Patient Re-evaluated:Patient Re-evaluated prior to induction Oxygen Delivery Method: Ambu bag Preoxygenation: Pre-oxygenation with 100% oxygen Induction Type: IV induction Dental Injury: Teeth and Oropharynx as per pre-operative assessment

## 2019-11-20 NOTE — Anesthesia Preprocedure Evaluation (Addendum)
Anesthesia Evaluation    Reviewed: Allergy & Precautions, Patient's Chart, lab work & pertinent test results  History of Anesthesia Complications (+) DIFFICULT AIRWAY and history of anesthetic complications  Airway Mallampati: II  TM Distance: >3 FB Neck ROM: Full    Dental no notable dental hx.    Pulmonary sleep apnea , COPD,  COPD inhaler, former smoker,    Pulmonary exam normal breath sounds clear to auscultation       Cardiovascular hypertension, + CAD, + Past MI, + Cardiac Stents and + CABG  Normal cardiovascular exam+ dysrhythmias Atrial Fibrillation  Rhythm:Irregular Rate:Tachycardia     Neuro/Psych CVA    GI/Hepatic negative GI ROS, Neg liver ROS,   Endo/Other  negative endocrine ROS  Renal/GU negative Renal ROS     Musculoskeletal History of post-polio syndrome History of Raynaud's syndrome Scoliosis   Abdominal   Peds  Hematology HLD   Anesthesia Other Findings A-FIB  Reproductive/Obstetrics                           Anesthesia Physical Anesthesia Plan  ASA: IV  Anesthesia Plan: General   Post-op Pain Management:    Induction: Intravenous  PONV Risk Score and Plan: 2 and Propofol infusion and Treatment may vary due to age or medical condition  Airway Management Planned: Mask  Additional Equipment:   Intra-op Plan:   Post-operative Plan:   Informed Consent:   Plan Discussed with:   Anesthesia Plan Comments:        Anesthesia Quick Evaluation

## 2019-11-20 NOTE — Anesthesia Postprocedure Evaluation (Signed)
Anesthesia Post Note  Patient: [name]  Procedure(s) Performed: CARDIOVERSION (N/A )     Patient location during evaluation: PACU Anesthesia Type: General Level of consciousness: awake and alert Pain management: pain level controlled Vital Signs Assessment: post-procedure vital signs reviewed and stable Respiratory status: spontaneous breathing, nonlabored ventilation, respiratory function stable and patient connected to nasal cannula oxygen Cardiovascular status: blood pressure returned to baseline and stable Postop Assessment: no apparent nausea or vomiting Anesthetic complications: no    Last Vitals:  Vitals:   11/20/19 1440 11/20/19 1450  BP: (!) 106/53 (!) 120/57  Pulse: 64 67  Resp: 16 19  Temp:    SpO2: 99% 100%    Last Pain:  Vitals:   11/20/19 1450  TempSrc:   PainSc: 0-No pain                 Loris Seelye

## 2019-11-20 NOTE — Anesthesia Postprocedure Evaluation (Signed)
Anesthesia Post Note  Patient: [name]  Procedure(s) Performed: CARDIOVERSION (N/A )     Patient location during evaluation: PACU Anesthesia Type: General Level of consciousness: awake and alert Pain management: pain level controlled Vital Signs Assessment: post-procedure vital signs reviewed and stable Respiratory status: spontaneous breathing, nonlabored ventilation, respiratory function stable and patient connected to nasal cannula oxygen Cardiovascular status: blood pressure returned to baseline and stable Postop Assessment: no apparent nausea or vomiting Anesthetic complications: no    Last Vitals:  Vitals:   11/20/19 1440 11/20/19 1450  BP: (!) 106/53 (!) 120/57  Pulse: 64 67  Resp: 16 19  Temp:    SpO2: 99% 100%    Last Pain:  Vitals:   11/20/19 1450  TempSrc:   PainSc: 0-No pain                 Danali Marinos

## 2019-11-20 NOTE — Transfer of Care (Signed)
Immediate Anesthesia Transfer of Care Note  Patient: [name]  Procedure(s) Performed: CARDIOVERSION (N/A )  Patient Location: Endoscopy Unit  Anesthesia Type:General  Level of Consciousness: awake, alert  and oriented  Airway & Oxygen Therapy: Patient Spontanous Breathing  Post-op Assessment: Report given to RN and Post -op Vital signs reviewed and stable  Post vital signs: Reviewed and stable  Last Vitals:  Vitals Value Taken Time  BP    Temp    Pulse 83 11/20/19 1426  Resp 24 11/20/19 1426  SpO2 100 % 11/20/19 1426    Last Pain:  Vitals:   11/20/19 1348  TempSrc: Oral  PainSc: 0-No pain         Complications: No apparent anesthesia complications

## 2019-11-20 NOTE — CV Procedure (Signed)
Procedure:   DCCV  Indication:  Symptomatic atrial flutter  Procedure Note:  The patient signed informed consent.  They have had had therapeutic anticoagulation with Eliquis greater than 3 weeks.  Anesthesia was administered by Dr. Ambrose Pancoast.  Adequate airway was maintained throughout and vital followed per protocol.  They were cardioverted x 1 with 100J of biphasic synchronized energy.  They converted to NSR with heart rate in 60s.  There were no apparent complications.  The patient had normal neuro status and respiratory status post procedure with vitals stable as recorded elsewhere.    Follow up:  They will continue on current medical therapy and follow up with cardiology as scheduled.  Oswaldo Milian, MD 11/20/2019 2:26 PM

## 2019-11-20 NOTE — Discharge Instructions (Signed)

## 2019-11-21 ENCOUNTER — Encounter (HOSPITAL_COMMUNITY): Payer: Self-pay | Admitting: Cardiology

## 2019-11-29 ENCOUNTER — Other Ambulatory Visit: Payer: Self-pay

## 2019-11-29 ENCOUNTER — Encounter (HOSPITAL_COMMUNITY): Payer: Self-pay | Admitting: Physician Assistant

## 2019-11-29 ENCOUNTER — Ambulatory Visit (HOSPITAL_COMMUNITY)
Admission: RE | Admit: 2019-11-29 | Discharge: 2019-11-29 | Disposition: A | Discharge status: Home / Self Care | Payer: Medicare Other | Source: Ambulatory Visit | Attending: Physician Assistant | Admitting: Physician Assistant

## 2019-11-29 VITALS — BP 146/56 | HR 59 | Ht 66.0 in | Wt 116.4 lb

## 2019-11-29 DIAGNOSIS — I5032 Chronic diastolic (congestive) heart failure: Secondary | ICD-10-CM | POA: Diagnosis not present

## 2019-11-29 DIAGNOSIS — E785 Hyperlipidemia, unspecified: Secondary | ICD-10-CM | POA: Diagnosis not present

## 2019-11-29 DIAGNOSIS — M62838 Other muscle spasm: Secondary | ICD-10-CM | POA: Diagnosis not present

## 2019-11-29 DIAGNOSIS — I25810 Atherosclerosis of coronary artery bypass graft(s) without angina pectoris: Secondary | ICD-10-CM | POA: Diagnosis not present

## 2019-11-29 DIAGNOSIS — Z7901 Long term (current) use of anticoagulants: Secondary | ICD-10-CM | POA: Insufficient documentation

## 2019-11-29 DIAGNOSIS — J449 Chronic obstructive pulmonary disease, unspecified: Secondary | ICD-10-CM | POA: Insufficient documentation

## 2019-11-29 DIAGNOSIS — I493 Ventricular premature depolarization: Secondary | ICD-10-CM | POA: Diagnosis not present

## 2019-11-29 DIAGNOSIS — Z8249 Family history of ischemic heart disease and other diseases of the circulatory system: Secondary | ICD-10-CM | POA: Insufficient documentation

## 2019-11-29 DIAGNOSIS — I071 Rheumatic tricuspid insufficiency: Secondary | ICD-10-CM | POA: Diagnosis not present

## 2019-11-29 DIAGNOSIS — I252 Old myocardial infarction: Secondary | ICD-10-CM | POA: Insufficient documentation

## 2019-11-29 DIAGNOSIS — Z87891 Personal history of nicotine dependence: Secondary | ICD-10-CM | POA: Diagnosis not present

## 2019-11-29 DIAGNOSIS — I484 Atypical atrial flutter: Secondary | ICD-10-CM | POA: Diagnosis not present

## 2019-11-29 DIAGNOSIS — Z802 Family history of malignant neoplasm of other respiratory and intrathoracic organs: Secondary | ICD-10-CM | POA: Diagnosis not present

## 2019-11-29 DIAGNOSIS — Z8673 Personal history of transient ischemic attack (TIA), and cerebral infarction without residual deficits: Secondary | ICD-10-CM | POA: Diagnosis not present

## 2019-11-29 DIAGNOSIS — I255 Ischemic cardiomyopathy: Secondary | ICD-10-CM | POA: Insufficient documentation

## 2019-11-29 DIAGNOSIS — Z79899 Other long term (current) drug therapy: Secondary | ICD-10-CM | POA: Diagnosis not present

## 2019-11-29 DIAGNOSIS — I110 Hypertensive heart disease with heart failure: Secondary | ICD-10-CM | POA: Insufficient documentation

## 2019-11-29 DIAGNOSIS — G4733 Obstructive sleep apnea (adult) (pediatric): Secondary | ICD-10-CM | POA: Diagnosis not present

## 2019-11-29 DIAGNOSIS — Z886 Allergy status to analgesic agent status: Secondary | ICD-10-CM | POA: Diagnosis not present

## 2019-11-29 DIAGNOSIS — D6869 Other thrombophilia: Secondary | ICD-10-CM

## 2019-11-29 DIAGNOSIS — Z951 Presence of aortocoronary bypass graft: Secondary | ICD-10-CM | POA: Insufficient documentation

## 2019-11-29 DIAGNOSIS — K219 Gastro-esophageal reflux disease without esophagitis: Secondary | ICD-10-CM | POA: Insufficient documentation

## 2019-11-29 DIAGNOSIS — Z7902 Long term (current) use of antithrombotics/antiplatelets: Secondary | ICD-10-CM | POA: Insufficient documentation

## 2019-11-29 DIAGNOSIS — Z91048 Other nonmedicinal substance allergy status: Secondary | ICD-10-CM | POA: Diagnosis not present

## 2019-11-29 DIAGNOSIS — I4819 Other persistent atrial fibrillation: Secondary | ICD-10-CM

## 2019-11-29 DIAGNOSIS — R9431 Abnormal electrocardiogram [ECG] [EKG]: Secondary | ICD-10-CM | POA: Insufficient documentation

## 2019-11-29 NOTE — Progress Notes (Signed)
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 The patient was initially diagnosed with atrial fibrillation several years ago and is s/p afib ablation 06/25/14 at [facility] by Dr. Lehman Prom and has been maintained on Tikosyn since. He has had some possible a fib, atrial flutter vs ectopic atrial rhythm. On 12/07/18, he was noted to be in an ectopic atrial rhythm with 2:1 block and was cardioverted on 12/11/18. On review of his ECGs, it appears he had some transient AV dissociation post cardioversion but is back in SR on follow up. He has been asymptomatic. He denies bleeding issues with anticoagulation. He has OSA and struggles with using his CPAP. He is now on an oral device and follows with Dr Radford Pax and Dr Toy Cookey.  Patient is s/p repeat afib ablation 06/08/19 with Dr Curt Bears. Patient admitted 10/24-10/26/20 with acute CVA after holding anticoagulation for oral surgery. He is on Eliquis for a CHADS2VASC score of 6.   On follow up today, patient is s/p DCCV 11/20/19. He reports that he is feeling much better with no dizziness or SOB. No complications since the procedure. He is in SR today.  Today, he denies symptoms of palpitations, SOB, chest pain, orthopnea, PND, lower extremity edema, presyncope, syncope, bleeding, or neurologic sequela. The patient is tolerating medications without difficulties and is otherwise without complaint today.    Atrial Fibrillation Risk Factors:  he does have symptoms or diagnosis of sleep apnea. he is compliant with OSA therapy. Now on oral appliance.    he has a BMI of Body mass index is 18.79 kg/m.[name] Filed Weights   11/29/19 1015  Weight: 52.8 kg     Atrial  Fibrillation Management history:  Previous antiarrhythmic drugs: Tikosyn (avoid amio given abn PFTs) Previous cardioversions: 12/11/18, 11/20/19 Previous ablations: 2015 at [facility], 06/08/19 CHADS2VASC score: 6 (age, HTN, CAD, CVA) Anticoagulation history: Xarelto, Eliquis   Past Medical History:  Diagnosis Date  . Abnormal nuclear cardiac imaging test March 2011   Has positive EKG response, no perfusion defect and normal EF  . Adenomatous colon polyp 12/2002  . Arrhythmia   . Atrial fibrillation ([place])    a. s/p rfca;  b. chronic tikosyn and xarelto.  . 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 Cataract    bil cataracts removed  . Chronic diastolic CHF (congestive heart failure) ([place])    a. 04/2017 Echo: EF 65-70%, restrictive physiology, mildly dil LA.  [name] Complication of anesthesia   . COPD (chronic obstructive pulmonary disease) ([place])   . Coronary artery disease    First MI in 1988. PCI in 1996 with subsequent CABG in 1996 due to restenosis. S/P stents to LCX in 2009. Noted to have residual disease in the LAD in a diffuse manner and atretic LIMA graft. He is managed medically.   . Difficult intubation    06/08/19 update - Intubated with MAC3 with grade IIb view 7.0 tube passed easily  . Dysrhythmia    Afib  . Esophageal stricture   . GERD (gastroesophageal reflux disease)   . 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 History of Raynaud's syndrome   . Hyperlipidemia   . Hypertension   .  Internal hemorrhoids   . Muscle spasm   . Myocardial infarction [facility])    MI x1 60 - 32 age  . Neuromuscular disorder ([place])    Polio - age 76  . OSA (obstructive sleep apnea) 10/16/2018   Severe OSA with AHI 37.2/hr on BiPAP  . Persistent atrial fibrillation ([place])   . Polio    age 6  . PVC (premature ventricular contraction)   . Scoliosis    mild   Past Surgical History:  Procedure Laterality Date  . ABLATION     done for a fib   . APPENDECTOMY     Age 40  . ATRIAL FIBRILLATION ABLATION N/A 06/08/2019   Procedure: ATRIAL FIBRILLATION ABLATION;  Surgeon: Constance Haw, MD;  Location: [place] CV LAB;  Service: Cardiovascular;  Laterality: N/A;  . CARDIAC CATHETERIZATION    . CARDIOVERSION N/A 08/22/2013   Procedure: CARDIOVERSION;  Surgeon: Carlena Bjornstad, MD;  Location: [facility] ENDOSCOPY;  Service: Cardiovascular;  Laterality: N/A;  . CARDIOVERSION N/A 01/16/2014   Procedure: CARDIOVERSION;  Surgeon: Lelon Perla, MD;  Location: [facility] ENDOSCOPY;  Service: Cardiovascular;  Laterality: N/A;  . CARDIOVERSION N/A 12/11/2018   Procedure: CARDIOVERSION;  Surgeon: Larey Dresser, MD;  Location: [facility] ENDOSCOPY;  Service: Cardiovascular;  Laterality: N/A;  . CARDIOVERSION N/A 11/20/2019   Procedure: CARDIOVERSION;  Surgeon: [name], MD;  Location: [facility] ENDOSCOPY;  Service: Endoscopy;  Laterality: N/A;  . COLONOSCOPY  2008  . CORONARY ARTERY BYPASS GRAFT  1996   with a LIMA to the LAD, SVG to RCA and SVG to OM  . CORONARY STENT INTERVENTION N/A 03/26/2019   Procedure: CORONARY STENT INTERVENTION;  Surgeon: Jettie Booze, MD;  Location: [place] CV LAB;  Service: Cardiovascular;  Laterality: N/A;  . CORONARY STENT PLACEMENT  1996   LCX  . EMBOLECTOMY Right 05/09/2019   Procedure: Embolectomy of right radial artery;  Surgeon: Serafina Mitchell, MD;  Location: [place];  Service: Vascular;  Laterality: Right;  . ESOPHAGEAL DILATION    . EYE SURGERY    . FALSE ANEURYSM REPAIR Right 05/09/2019   Procedure: REPAIR FALSE ANEURYSM RIGHT RADIAL;  Surgeon: Serafina Mitchell, MD;  Location: [place];  Service: Vascular;  Laterality: Right;  . LEFT HEART CATH AND CORS/GRAFTS ANGIOGRAPHY N/A 03/26/2019   Procedure: LEFT HEART CATH AND CORS/GRAFTS ANGIOGRAPHY;  Surgeon: Larey Dresser, MD;  Location: [place] [facility];  Service: Cardiovascular;  Laterality: N/A;  . POLYPECTOMY    . TONSILLECTOMY    . UPPER  GASTROINTESTINAL ENDOSCOPY     esophegeal dilation    Current Outpatient Medications  Medication Sig Dispense Refill  . acetaminophen (TYLENOL) 500 MG tablet Take 500 mg by mouth every 8 (eight) hours as needed for mild pain or moderate pain.     [name] albuterol (PROAIR HFA) 108 (90 BASE) MCG/ACT inhaler Inhale 2 puffs into the lungs every 4 (four) hours as needed for wheezing or shortness of breath (and prior to exercise). 1 Inhaler 3  . AMBULATORY NON FORMULARY MEDICATION Apply 1 mL topically daily. Testosterone cream 150mg /15% Place 1 ml  onto the skin daily    . apixaban (ELIQUIS) 2.5 MG TABS tablet Take 1 tablet (2.5 mg total) by mouth 2 (two) times daily. 180 tablet 1  . BYSTOLIC 10 MG tablet TAKE 1 TABLET BY MOUTH EVERY DAY 90 tablet 1  . clopidogrel (PLAVIX) 75 MG tablet TAKE 1 TABLET (75 MG TOTAL) BY MOUTH DAILY WITH BREAKFAST.  90 tablet 2  . dicyclomine (BENTYL) 10 MG capsule Take 10 mg by mouth 3 (three) times daily as needed for spasms.    [name] dofetilide (TIKOSYN) 250 MCG capsule TAKE 1 CAPSULE BY MOUTH EVERY 12 HOURS *NEED OFFICE VISIT* (Patient taking differently: Take 250 mcg by mouth every 12 (twelve) hours. ) 180 capsule 3  . ezetimibe (ZETIA) 10 MG tablet TAKE 1 TABLET BY MOUTH EVERY DAY (Patient taking differently: Take 10 mg by mouth every evening. ) 90 tablet 3  . famotidine (PEPCID) 40 MG tablet Take 1 tablet (40 mg total) by mouth at bedtime. 30 tablet 11  . furosemide (LASIX) 40 MG tablet Take 0.5 tablets (20 mg total) by mouth daily. 15 tablet 6  . lactobacillus acidophilus & bulgar (LACTINEX) chewable tablet Chew 2 tablets by mouth daily.     [name] loperamide (IMODIUM) 2 MG capsule Take 2-4 mg by mouth as needed for diarrhea or loose stools.     [name] LORazepam (ATIVAN) 0.5 MG tablet Take 0.5 mg by mouth at bedtime.     . magnesium oxide (MAG-OX) 400 MG tablet Take 400 mg by mouth every evening.     . methocarbamol (ROBAXIN) 500 MG tablet Take 500 mg by mouth 3 (three) times daily  as needed for muscle spasms.     . nitroGLYCERIN (NITROSTAT) 0.4 MG SL tablet Place 1 tablet (0.4 mg total) under the tongue every 5 (five) minutes as needed for chest pain. 25 tablet 0  . potassium chloride SA (K-DUR) 20 MEQ tablet Take 1 tablet (20 mEq total) by mouth at bedtime. 90 tablet 3  . Probiotic Product (ALIGN) 4 MG CAPS Take 4 mg by mouth every evening.     [name] Propylene Glycol (SYSTANE BALANCE) 0.6 % SOLN Place 1 drop into both eyes daily as needed (dry eyes).    . rosuvastatin (CRESTOR) 40 MG tablet TAKE 1 TABLET BY MOUTH EVERY DAY (Patient taking differently: Take 40 mg by mouth every evening. ) 90 tablet 3  . sodium chloride (OCEAN) 0.65 % SOLN nasal spray Place 1 spray into both nostrils as needed for congestion.    . traZODone (DESYREL) 50 MG tablet Take 50 mg by mouth at bedtime.    . Vitamin D, Ergocalciferol, (DRISDOL) 50000 UNITS CAPS capsule Take 50,000 Units by mouth every 14 (fourteen) days. Take on 1st and 15th     No current facility-administered medications for this encounter.     Allergies  Allergen Reactions  . Morphine And Related Other (See Comments)    IV forms- vein irritation  . Tape Other (See Comments)    Skin Irritation    Social History   Socioeconomic History  . Marital status: Married    Spouse name: Inez Catalina  . Number of children: 2  . Years of education: Not on file  . Highest education level: Not on file  Occupational History  . Occupation: Software engineer  Social Needs  . Financial resource strain: Not hard at all  . Food insecurity    Worry: Never true    Inability: Never true  . Transportation needs    Medical: No    Non-medical: No  Tobacco Use  . Smoking status: Former Smoker    Packs/day: 0.50    Years: 5.00    Pack years: 2.50    Types: Cigarettes    Quit date: 12/27/1966    Years since quitting: 52.9  . Smokeless tobacco: Former Systems developer    Quit date: 1950  Substance  and Sexual Activity  . Alcohol use: No  . Drug use: No  .  Sexual activity: Yes  Lifestyle  . Physical activity    Days per week: 0 days    Minutes per session: 0 min  . Stress: To some extent  Relationships  . Social Herbalist on phone: Not on file    Gets together: Not on file    Attends religious service: Not on file    Active member of club or organization: Not on file    Attends meetings of clubs or organizations: Not on file    Relationship status: Not on file  . Intimate partner violence    Fear of current or ex partner: Not on file    Emotionally abused: Not on file    Physically abused: Not on file    Forced sexual activity: Not on file  Other Topics Concern  . 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 Heart disease Mother   . Heart disease Father   . Heart Problems Brother        heart transplant  . Heart disease Maternal Grandmother   . Heart disease Maternal Grandfather   . Heart disease Paternal Grandmother   . Heart disease Paternal Grandfather   . Prostate cancer Paternal Uncle   . Cancer Son        thymic cancer.  died at age 23  . Colon cancer Neg Hx   . Esophageal cancer Neg Hx   . Pancreatic cancer Neg Hx   . Rectal cancer Neg Hx   . Stomach cancer Neg Hx     ROS- All systems are reviewed and negative except as per the HPI above.  Physical Exam: Vitals:   11/29/19 1015  BP: (!) 146/56  Pulse: (!) 59  Weight: 52.8 kg  Height: 5\' 6"  (1.676 m)    GEN- The patient is well appearing elderly male, alert and oriented x 3 today.   HEENT-head normocephalic, atraumatic, sclera clear, conjunctiva pink, hearing intact, trachea midline. Lungs- Clear to ausculation bilaterally, normal work of breathing Heart- Regular rate and rhythm, no murmurs, rubs or gallops  GI- soft, NT, ND, + BS Extremities- no clubbing, cyanosis, or edema MS- no significant deformity or  atrophy Skin- no rash or lesion Psych- euthymic mood, full affect Neuro- strength and sensation are intact   Wt Readings from Last 3 Encounters:  11/29/19 52.8 kg  11/20/19 50.3 kg  11/13/19 50.9 kg    EKG today demonstrates SR HR 59, PR 136, QRS 106, QTc 449  Echo 03/26/19 demonstrated   1. The left ventricle has mildly reduced systolic function, with an ejection fraction of 45-50%. The cavity size was mildly dilated. Left ventricular diastolic Doppler parameters are indeterminate.  2. Posterior lateral and inferior basal hypokinesis.  3. The right ventricle has normal systolic function. The cavity was normal. There is no increase in right ventricular wall thickness.  4. Left atrial size was mildly dilated.  5. Right atrial size was mildly dilated.  6. The mitral valve is degenerative. Moderate thickening of the mitral valve leaflet. Mild calcification of the mitral valve leaflet. There is mild mitral annular calcification present.  7. Tricuspid valve regurgitation is mild-moderate.  8. The aortic valve is tricuspid. Moderate thickening of the aortic valve.  Moderate sclerosis of the aortic valve.  Epic records reviewed.   Assessment and Plan:  1. Persistent atrial fibrillation/atypical atrial flutter S/p repeat afib ablation with Dr Curt Bears 06/08/19. Per procedure report, flutter unable to be ablated due to complexity of atrial flutter. S/p DCCV on 11/20/19. Patient appears to be maintaining SR. Continue Eliquis 2.5 mg BID Continue dofetilide 250 mcg BID. QT stable. We discussed the possibility that if he fails dofetilide, he may required AV node ablation.   This patients CHA2DS2-VASc Score and unadjusted Ischemic Stroke Rate (% per year) is equal to 9.7 % stroke rate/year from a score of 6  Above score calculated as 1 point each if present [CHF, HTN, DM, Vascular=MI/PAD/Aortic Plaque, Age if 65-74, or Male] Above score calculated as 2 points each if present [Age > 75, or  Stroke/TIA/TE]   2. Obstructive sleep apnea The importance of adequate treatment of sleep apnea was discussed today in order to improve our ability to maintain sinus rhythm long term. Followed by Dr Toy Cookey.  3. CAD No anginal symptoms. Followed by Dr Aundra Dubin.  4. HTN Stable, no changes today.  5. Ischemic CM No signs or symptoms of fluid overload.   Follow up with Dr Curt Bears as scheduled.    [facility] [address] [place], [place] [address] [phone]

## 2019-11-30 DIAGNOSIS — M859 Disorder of bone density and structure, unspecified: Secondary | ICD-10-CM | POA: Diagnosis not present

## 2019-11-30 DIAGNOSIS — E7849 Other hyperlipidemia: Secondary | ICD-10-CM | POA: Diagnosis not present

## 2019-12-04 DIAGNOSIS — R82998 Other abnormal findings in urine: Secondary | ICD-10-CM | POA: Diagnosis not present

## 2019-12-10 ENCOUNTER — Other Ambulatory Visit (HOSPITAL_COMMUNITY): Payer: Self-pay | Admitting: Cardiology

## 2019-12-10 ENCOUNTER — Telehealth: Payer: Self-pay | Admitting: Cardiology

## 2019-12-10 NOTE — Telephone Encounter (Signed)
New Message  Pt's wife called to confirm appt and stated that she will be accompanying him on appt. Would not give medical reason  Please call to discuss

## 2019-12-11 NOTE — Telephone Encounter (Signed)
Spoke to both pt and wife. Wife aware of Covid office policy.  She is absolutely fine with being on speakerphone instead of the room with pt. They appreciate my return call

## 2019-12-13 ENCOUNTER — Ambulatory Visit (INDEPENDENT_AMBULATORY_CARE_PROVIDER_SITE_OTHER): Payer: Medicare Other | Admitting: Cardiology

## 2019-12-13 ENCOUNTER — Other Ambulatory Visit: Payer: Self-pay

## 2019-12-13 ENCOUNTER — Encounter: Payer: Self-pay | Admitting: Cardiology

## 2019-12-13 VITALS — BP 124/58 | HR 59 | Ht 66.0 in | Wt 117.0 lb

## 2019-12-13 DIAGNOSIS — I639 Cerebral infarction, unspecified: Secondary | ICD-10-CM

## 2019-12-13 DIAGNOSIS — I4819 Other persistent atrial fibrillation: Secondary | ICD-10-CM

## 2019-12-13 NOTE — Progress Notes (Signed)
Electrophysiology Office Note   Date:  12/13/2019   ID:  [name], DOB [birth year], MRN [id number]  PCP:  Burnard Bunting, MD  Cardiologist:  Aundra Dubin Primary Electrophysiologist:  Jhana Giarratano Meredith Leeds, MD    No chief complaint on file.    History of Present Illness: [name] is a 83 y.o. male who is being seen today for the evaluation of atrial fibrillation/flutter at the request of Burnard Bunting, MD. Presenting today for electrophysiology evaluation.  He has a history of persistent atrial fibrillation, coronary disease, COPD, CVA, OSA, hypertension, hyperlipidemia.  He was diagnosed with atrial fibrillation several years ago and is status post ablation in 2015 by Dr. Lehman Prom.  He had been on Tikosyn since that time.  On 219 he was in an ectopic atrial rhythm with 2-1 heart block and was cardioverted 12/11/2018.  He has sleep apnea and is on BiPAP.  He was seen by Dr. Radford Pax who found him to be out of rhythm and referred him to atrial fibrillation clinic.  His EKG appeared to be consistent with atypical atrial flutter.  He had atrial fibrillation/flutter ablation 06/08/2019.  Since that time, it appears that he has maintained sinus rhythm.  He did wear a cardiac monitor which showed short episodes of SVT.  He has been going to cardiac rehab, though on their 3-lead ECG they have been concerned and have not performed rehab a few times.  He does have episodes where he feels weak and fatigued.  During these episodes, his heart rates are in the 60s.  Today, denies symptoms of palpitations, chest pain, shortness of breath, orthopnea, PND, lower extremity edema, claudication, dizziness, presyncope, syncope, bleeding, or neurologic sequela. The patient is tolerating medications without difficulties.  Overall he is doing well.  He has no chest pain or shortness of breath.  He does have some mild fatigue, but that is his only complaint.   Past Medical History:  Diagnosis Date  . Abnormal  nuclear cardiac imaging test March 2011   Has positive EKG response, no perfusion defect and normal EF  . Adenomatous colon polyp 12/2002  . Arrhythmia   . Atrial fibrillation ([place])    a. s/p rfca;  b. chronic tikosyn and xarelto.  . 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 Cataract    bil cataracts removed  . Chronic diastolic CHF (congestive heart failure) ([place])    a. 04/2017 Echo: EF 65-70%, restrictive physiology, mildly dil LA.  [name] Complication of anesthesia   . COPD (chronic obstructive pulmonary disease) ([place])   . Coronary artery disease    First MI in 1988. PCI in 1996 with subsequent CABG in 1996 due to restenosis. S/P stents to LCX in 2009. Noted to have residual disease in the LAD in a diffuse manner and atretic LIMA graft. He is managed medically.   . Difficult intubation    06/08/19 update - Intubated with MAC3 with grade IIb view 7.0 tube passed easily  . Dysrhythmia    Afib  . Esophageal stricture   . GERD (gastroesophageal reflux disease)   . 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 History of Raynaud's syndrome   . Hyperlipidemia   . Hypertension   . Internal hemorrhoids   . Muscle spasm   . Myocardial infarction [facility])    MI x1 31 - 61 age  . Neuromuscular disorder ([place])    Polio - age 67  . OSA (  obstructive sleep apnea) 10/16/2018   Severe OSA with AHI 37.2/hr on BiPAP  . Persistent atrial fibrillation ([place])   . Polio    age 22  . PVC (premature ventricular contraction)   . Scoliosis    mild   Past Surgical History:  Procedure Laterality Date  . ABLATION     done for a fib  . APPENDECTOMY     Age 89  . ATRIAL FIBRILLATION ABLATION N/A 06/08/2019   Procedure: ATRIAL FIBRILLATION ABLATION;  Surgeon: Constance Haw, MD;  Location: [place] CV LAB;  Service: Cardiovascular;  Laterality: N/A;  . CARDIAC CATHETERIZATION    . CARDIOVERSION N/A 08/22/2013   Procedure: CARDIOVERSION;  Surgeon:  Carlena Bjornstad, MD;  Location: [facility] ENDOSCOPY;  Service: Cardiovascular;  Laterality: N/A;  . CARDIOVERSION N/A 01/16/2014   Procedure: CARDIOVERSION;  Surgeon: Lelon Perla, MD;  Location: [facility] ENDOSCOPY;  Service: Cardiovascular;  Laterality: N/A;  . CARDIOVERSION N/A 12/11/2018   Procedure: CARDIOVERSION;  Surgeon: Larey Dresser, MD;  Location: [facility] ENDOSCOPY;  Service: Cardiovascular;  Laterality: N/A;  . CARDIOVERSION N/A 11/20/2019   Procedure: CARDIOVERSION;  Surgeon: [name], MD;  Location: [facility] ENDOSCOPY;  Service: Endoscopy;  Laterality: N/A;  . COLONOSCOPY  2008  . CORONARY ARTERY BYPASS GRAFT  1996   with a LIMA to the LAD, SVG to RCA and SVG to OM  . CORONARY STENT INTERVENTION N/A 03/26/2019   Procedure: CORONARY STENT INTERVENTION;  Surgeon: Jettie Booze, MD;  Location: [place] CV LAB;  Service: Cardiovascular;  Laterality: N/A;  . CORONARY STENT PLACEMENT  1996   LCX  . EMBOLECTOMY Right 05/09/2019   Procedure: Embolectomy of right radial artery;  Surgeon: Serafina Mitchell, MD;  Location: [place];  Service: Vascular;  Laterality: Right;  . ESOPHAGEAL DILATION    . EYE SURGERY    . FALSE ANEURYSM REPAIR Right 05/09/2019   Procedure: REPAIR FALSE ANEURYSM RIGHT RADIAL;  Surgeon: Serafina Mitchell, MD;  Location: [place];  Service: Vascular;  Laterality: Right;  . LEFT HEART CATH AND CORS/GRAFTS ANGIOGRAPHY N/A 03/26/2019   Procedure: LEFT HEART CATH AND CORS/GRAFTS ANGIOGRAPHY;  Surgeon: Larey Dresser, MD;  Location: [place] CV LAB;  Service: Cardiovascular;  Laterality: N/A;  . POLYPECTOMY    . TONSILLECTOMY    . UPPER GASTROINTESTINAL ENDOSCOPY     esophegeal dilation     Current Outpatient Medications  Medication Sig Dispense Refill  . acetaminophen (TYLENOL) 500 MG tablet Take 500 mg by mouth every 8 (eight) hours as needed for mild pain or moderate pain.     [name] albuterol (PROAIR HFA) 108 (90 BASE) MCG/ACT inhaler Inhale 2 puffs into the lungs  every 4 (four) hours as needed for wheezing or shortness of breath (and prior to exercise). 1 Inhaler 3  . AMBULATORY NON FORMULARY MEDICATION Apply 1 mL topically daily. Testosterone cream 150mg /15% Place 1 ml  onto the skin daily    . apixaban (ELIQUIS) 2.5 MG TABS tablet Take 1 tablet (2.5 mg total) by mouth 2 (two) times daily. 180 tablet 1  . BYSTOLIC 10 MG tablet TAKE 1 TABLET BY MOUTH EVERY DAY 90 tablet 1  . clopidogrel (PLAVIX) 75 MG tablet TAKE 1 TABLET (75 MG TOTAL) BY MOUTH DAILY WITH BREAKFAST. 90 tablet 2  . dicyclomine (BENTYL) 10 MG capsule Take 10 mg by mouth 3 (three) times daily as needed for spasms.    [name] dofetilide (TIKOSYN) 250 MCG capsule TAKE 1 CAPSULE BY  MOUTH EVERY 12 HOURS *NEED OFFICE VISIT* (Patient taking differently: Take 250 mcg by mouth every 12 (twelve) hours. ) 180 capsule 3  . ezetimibe (ZETIA) 10 MG tablet TAKE 1 TABLET BY MOUTH EVERY DAY (Patient taking differently: Take 10 mg by mouth every evening. ) 90 tablet 3  . famotidine (PEPCID) 40 MG tablet Take 1 tablet (40 mg total) by mouth at bedtime. 30 tablet 11  . furosemide (LASIX) 40 MG tablet Take one tablet two times daily for 5 days, then 1 each morning and 1/2 in the afternoon. 140 tablet PRN  . lactobacillus acidophilus & bulgar (LACTINEX) chewable tablet Chew 2 tablets by mouth daily.     [name] loperamide (IMODIUM) 2 MG capsule Take 2-4 mg by mouth as needed for diarrhea or loose stools.     [name] LORazepam (ATIVAN) 0.5 MG tablet Take 0.5 mg by mouth at bedtime.     . magnesium oxide (MAG-OX) 400 MG tablet Take 400 mg by mouth every evening.     . methocarbamol (ROBAXIN) 500 MG tablet Take 500 mg by mouth 3 (three) times daily as needed for muscle spasms.     . nitroGLYCERIN (NITROSTAT) 0.4 MG SL tablet Place 1 tablet (0.4 mg total) under the tongue every 5 (five) minutes as needed for chest pain. 25 tablet 0  . potassium chloride SA (K-DUR) 20 MEQ tablet Take 1 tablet (20 mEq total) by mouth at bedtime. 90 tablet  3  . Probiotic Product (ALIGN) 4 MG CAPS Take 4 mg by mouth every evening.     [name] Propylene Glycol (SYSTANE BALANCE) 0.6 % SOLN Place 1 drop into both eyes daily as needed (dry eyes).    . rosuvastatin (CRESTOR) 40 MG tablet TAKE 1 TABLET BY MOUTH EVERY DAY (Patient taking differently: Take 40 mg by mouth every evening. ) 90 tablet 3  . sodium chloride (OCEAN) 0.65 % SOLN nasal spray Place 1 spray into both nostrils as needed for congestion.    . traZODone (DESYREL) 50 MG tablet Take 50 mg by mouth at bedtime.    . Vitamin D, Ergocalciferol, (DRISDOL) 50000 UNITS CAPS capsule Take 50,000 Units by mouth every 14 (fourteen) days. Take on 1st and 15th     No current facility-administered medications for this visit.    Allergies:   Morphine and related and Tape   Social History:  The patient  reports that he quit smoking about 52 years ago. His smoking use included cigarettes. He has a 2.50 pack-year smoking history. He quit smokeless tobacco use about 71 years ago. He reports that he does not drink alcohol or use drugs.   Family History:  The patient's family history includes Cancer in his son; Heart Problems in his brother; Heart disease in his father, maternal grandfather, maternal grandmother, mother, paternal grandfather, and paternal grandmother; Prostate cancer in his paternal uncle.    ROS:  Please see the history of present illness.   Otherwise, review of systems is positive for none.   All other systems are reviewed and negative.   PHYSICAL EXAM: VS:  BP (!) 124/58   Pulse (!) 59   Ht 5\' 6"  (1.676 m)   Wt 117 lb (53.1 kg)   SpO2 94%   BMI 18.88 kg/m  , BMI Body mass index is 18.88 kg/m. GEN: Well nourished, well developed, in no acute distress  HEENT: normal  Neck: no JVD, carotid bruits, or masses Cardiac: RRR; no murmurs, rubs, or gallops,no edema  Respiratory:  clear  to auscultation bilaterally, normal work of breathing GI: soft, nontender, nondistended, + BS MS: no  deformity or atrophy  Skin: warm and dry Neuro:  Strength and sensation are intact Psych: euthymic mood, full affect  EKG:  EKG is ordered today. Personal review of the ekg ordered shows sinus rhythm, intermittent junctional escape, rate 59  Recent Labs: 10/11/2019: Magnesium 2.4 10/21/2019: ALT 21; TSH 1.123 11/13/2019: Platelets 257 11/20/2019: BUN 37; Creatinine, Ser 1.20; Hemoglobin 14.6; Potassium 4.9; Sodium 137    Lipid Panel     Component Value Date/Time   CHOL 108 10/21/2019 0441   TRIG 65 10/21/2019 0441   HDL 36 (L) 10/21/2019 0441   CHOLHDL 3.0 10/21/2019 0441   VLDL 13 10/21/2019 0441   LDLCALC 59 10/21/2019 0441     Wt Readings from Last 3 Encounters:  12/13/19 117 lb (53.1 kg)  11/29/19 116 lb 6.4 oz (52.8 kg)  11/20/19 111 lb (50.3 kg)      Other studies Reviewed: Additional studies/ records that were reviewed today include: TTE 12/29/18  Review of the above records today demonstrates:  - Left ventricle: The cavity size was normal. Systolic function was   normal. The estimated ejection fraction was in the range of 55%   to 60%. Hypokinesis of the basalanteroseptal and inferoseptal   myocardium. The study is not technically sufficient to allow   evaluation of LV diastolic function. - Aortic valve: Valve mobility was restricted. Transvalvular   velocity was within the normal range. There was no stenosis.   There was no regurgitation. Mean gradient (S): 8 mm Hg. Valve   area (VTI): 1.28 cm^2. Valve area (Vmax): 1.28 cm^2. Valve area   (Vmean): 1.26 cm^2. - Mitral valve: Transvalvular velocity was within the normal range.   There was no evidence for stenosis. There was mild regurgitation. - Left atrium: The atrium was moderately dilated. - Right ventricle: The cavity size was normal. Wall thickness was   normal. Systolic function was normal. - Tricuspid valve: There was mild regurgitation. - Pulmonary arteries: Systolic pressure was within the normal    range. PA peak pressure: 35 mm Hg (S).   ASSESSMENT AND PLAN:  1.  Persistent atrial fibrillation/atypical atrial flutter: Currently on Xarelto, dofetilide, diltiazem, Bystolic.  Had ablation 06/08/2019 with multiple flutter circuits.  CHA2DS2-VASc of 6.  He is in sinus rhythm today with junctional rhythm as well intermittently.  We [name] thus stop his Bystolic to see if his sinus node improves.  He is having some weakness and fatigue that could be contributing.   2.  Obstructive sleep apnea: Cannot tolerate the facemask.  Has been referred for an oral appliance.  Compliance encouraged.  3.  Coronary artery disease: Status post CABG.  No current chest pain.  4.  Hypertension: Currently well controlled    Current medicines are reviewed at length with the patient today.   The patient does not have concerns regarding his medicines.  The following changes were made today: Stop Bystolic  Labs/ tests ordered today include:  Orders Placed This Encounter  Procedures  . EKG 12-Lead     Disposition:   FU with Guynell Kleiber 6 months  Signed, Pietro Bonura Meredith Leeds, MD  12/13/2019 11:57 AM     [facility] [address] [place] [address] [phone] (office) [phone] (fax)

## 2019-12-13 NOTE — Addendum Note (Signed)
Addended by: Stanton Kidney on: 12/13/2019 12:05 PM   Modules accepted: Orders

## 2019-12-13 NOTE — Patient Instructions (Signed)
Medication Instructions:  Your physician has recommended you make the following change in your medication:  1. STOP Bystolic  * If you need a refill on your cardiac medications before your next appointment, please call your pharmacy.   Labwork: Your physician recommends that you return for lab work after christmas:  BMET & Magnesium If you have labs (blood work) drawn today and your tests are completely normal, you will receive your results only by:  [place] (if you have MyChart) OR  A paper copy in the mail If you have any lab test that is abnormal or we need to change your treatment, we will call you to review the results.  Testing/Procedures: None ordered  Follow-Up: At [facility], you and your health needs are our priority.  As part of our continuing mission to provide you with exceptional heart care, we have created designated Provider Care Teams.  These Care Teams include your primary Cardiologist (physician) and Advanced Practice Providers (APPs -  Physician Assistants and Nurse Practitioners) who all work together to provide you with the care you need, when you need it.  You will need a follow up appointment in 6 months.  Please call our office 2 months in advance to schedule this appointment.  You may see Dr Curt Bears or one of the following Advanced Practice Providers on your designated Care Team:    Chanetta Marshall, NP  Tommye Standard, PA-C  Oda Kilts, [place]  Thank you for choosing [facility]!!   Trinidad Curet, RN [phone]  Any Other Special Instructions Will Be Listed Below (If Applicable).

## 2019-12-14 ENCOUNTER — Telehealth: Payer: Self-pay | Admitting: Cardiology

## 2019-12-14 NOTE — Telephone Encounter (Signed)
New Message  i have [name] on the line returning your phone call from yesterday about lab results   Please call

## 2019-12-14 NOTE — Telephone Encounter (Signed)
Wife informed that I would leave it downstairs for them to get next week.  She is agreeable

## 2019-12-14 NOTE — Telephone Encounter (Signed)
Patient's wife is calling back to let Dr. Macky Lower nurse know that she would like those papers back that they left at the office yesterday. Patient can pick up paper work at front desk. Please give her a call and let her know when she can come pick them up.

## 2019-12-18 DIAGNOSIS — Z1212 Encounter for screening for malignant neoplasm of rectum: Secondary | ICD-10-CM | POA: Diagnosis not present

## 2020-02-06 ENCOUNTER — Telehealth (HOSPITAL_COMMUNITY): Payer: Self-pay

## 2020-02-06 NOTE — Telephone Encounter (Signed)
COVID-19 pre-appointment screening questions:   Do you have a history of COVID-19 or a positive test result in the past 7-10 days? NO  To the best of your knowledge, have you been in close contact with anyone with a confirmed diagnosis of COVID 19?NO  Have you had any one or more of the following: Fever, chills, cough, shortness of breath (out of the normal for you) or any flu-like symptoms?YES,CHILLS AND COUGH BUT DUE TO COVID VACCINE;OK TO KEEP IN PERSON APPOINTMENT PER DAPHENE WOOD,RN   Are you experiencing any of the following symptoms that is new or out of usual for you:NO   . Ear, nose or throat discomfort . Sore throat . Headache . Muscle Pain . Diarrhea . Loss of taste or smell   . Reviewed all the following with patient: REVIEWED . Use of hand sanitizer when entering the building . Everyone is required to wear a mask in the building, if you do not have a mask we are happy to provide you with one when you arrive . NO Visitor guidelines   If patient answers YES to any of questions they must change to a virtual visit and place note in comments about symptoms

## 2020-02-07 ENCOUNTER — Ambulatory Visit (HOSPITAL_COMMUNITY)
Admission: RE | Admit: 2020-02-07 | Discharge: 2020-02-07 | Disposition: A | Discharge status: Home / Self Care | Payer: Medicare Other | Source: Ambulatory Visit | Attending: Cardiology | Admitting: Cardiology

## 2020-02-07 ENCOUNTER — Other Ambulatory Visit: Payer: Self-pay

## 2020-02-07 VITALS — BP 118/56 | HR 64 | Wt 121.4 lb

## 2020-02-07 DIAGNOSIS — Z8249 Family history of ischemic heart disease and other diseases of the circulatory system: Secondary | ICD-10-CM | POA: Diagnosis not present

## 2020-02-07 DIAGNOSIS — N183 Chronic kidney disease, stage 3 unspecified: Secondary | ICD-10-CM | POA: Insufficient documentation

## 2020-02-07 DIAGNOSIS — Z955 Presence of coronary angioplasty implant and graft: Secondary | ICD-10-CM | POA: Diagnosis not present

## 2020-02-07 DIAGNOSIS — I484 Atypical atrial flutter: Secondary | ICD-10-CM | POA: Insufficient documentation

## 2020-02-07 DIAGNOSIS — E785 Hyperlipidemia, unspecified: Secondary | ICD-10-CM | POA: Insufficient documentation

## 2020-02-07 DIAGNOSIS — Z79899 Other long term (current) drug therapy: Secondary | ICD-10-CM | POA: Diagnosis not present

## 2020-02-07 DIAGNOSIS — Z87891 Personal history of nicotine dependence: Secondary | ICD-10-CM | POA: Insufficient documentation

## 2020-02-07 DIAGNOSIS — I25810 Atherosclerosis of coronary artery bypass graft(s) without angina pectoris: Secondary | ICD-10-CM | POA: Insufficient documentation

## 2020-02-07 DIAGNOSIS — Z951 Presence of aortocoronary bypass graft: Secondary | ICD-10-CM | POA: Insufficient documentation

## 2020-02-07 DIAGNOSIS — Z888 Allergy status to other drugs, medicaments and biological substances status: Secondary | ICD-10-CM | POA: Insufficient documentation

## 2020-02-07 DIAGNOSIS — Z808 Family history of malignant neoplasm of other organs or systems: Secondary | ICD-10-CM | POA: Insufficient documentation

## 2020-02-07 DIAGNOSIS — G4733 Obstructive sleep apnea (adult) (pediatric): Secondary | ICD-10-CM | POA: Diagnosis not present

## 2020-02-07 DIAGNOSIS — Z7902 Long term (current) use of antithrombotics/antiplatelets: Secondary | ICD-10-CM | POA: Diagnosis not present

## 2020-02-07 DIAGNOSIS — K219 Gastro-esophageal reflux disease without esophagitis: Secondary | ICD-10-CM | POA: Insufficient documentation

## 2020-02-07 DIAGNOSIS — Z8673 Personal history of transient ischemic attack (TIA), and cerebral infarction without residual deficits: Secondary | ICD-10-CM | POA: Insufficient documentation

## 2020-02-07 DIAGNOSIS — I255 Ischemic cardiomyopathy: Secondary | ICD-10-CM | POA: Insufficient documentation

## 2020-02-07 DIAGNOSIS — I480 Paroxysmal atrial fibrillation: Secondary | ICD-10-CM | POA: Diagnosis not present

## 2020-02-07 DIAGNOSIS — Z7901 Long term (current) use of anticoagulants: Secondary | ICD-10-CM | POA: Insufficient documentation

## 2020-02-07 DIAGNOSIS — I5032 Chronic diastolic (congestive) heart failure: Secondary | ICD-10-CM | POA: Insufficient documentation

## 2020-02-07 DIAGNOSIS — Z8042 Family history of malignant neoplasm of prostate: Secondary | ICD-10-CM | POA: Insufficient documentation

## 2020-02-07 DIAGNOSIS — I252 Old myocardial infarction: Secondary | ICD-10-CM | POA: Insufficient documentation

## 2020-02-07 DIAGNOSIS — R9431 Abnormal electrocardiogram [ECG] [EKG]: Secondary | ICD-10-CM | POA: Insufficient documentation

## 2020-02-07 DIAGNOSIS — I471 Supraventricular tachycardia: Secondary | ICD-10-CM | POA: Insufficient documentation

## 2020-02-07 DIAGNOSIS — Z885 Allergy status to narcotic agent status: Secondary | ICD-10-CM | POA: Insufficient documentation

## 2020-02-07 DIAGNOSIS — I4891 Unspecified atrial fibrillation: Secondary | ICD-10-CM | POA: Diagnosis not present

## 2020-02-07 LAB — BASIC METABOLIC PANEL
Anion gap: 9 (ref 5–15)
BUN: 17 mg/dL (ref 8–23)
CO2: 27 mmol/L (ref 22–32)
Calcium: 10.1 mg/dL (ref 8.9–10.3)
Chloride: 102 mmol/L (ref 98–111)
Creatinine, Ser: 1.56 mg/dL — ABNORMAL HIGH (ref 0.61–1.24)
GFR calc Af Amer: 48 mL/min — ABNORMAL LOW (ref >60)
GFR calc non Af Amer: 41 mL/min — ABNORMAL LOW (ref >60)
Glucose, Bld: 88 mg/dL (ref 70–99)
Potassium: 4.5 mmol/L (ref 3.5–5.1)
Sodium: 138 mmol/L (ref 135–145)

## 2020-02-07 LAB — MAGNESIUM: Magnesium: 2.3 mg/dL (ref 1.7–2.4)

## 2020-02-07 NOTE — Patient Instructions (Addendum)
STOP Plavix at the end of the [facility] today We will only contact you if something comes back abnormal or we need to make some changes. Otherwise no news is good news!   Your physician recommends that you schedule a follow-up appointment in: 2 month with Dr Aundra Dubin on Thursday, April 15th, 2021 at 11:40a. [place]   Please call office at [phone] option 2 if you have any questions or concerns.    At the [facility], you and your health needs are our priority. As part of our continuing mission to provide you with exceptional heart care, we have created designated Provider Care Teams. These Care Teams include your primary Cardiologist (physician) and Advanced Practice Providers (APPs- Physician Assistants and Nurse Practitioners) who all work together to provide you with the care you need, when you need it.   You may see any of the following providers on your designated Care Team at your next follow up: [name] Dr Glori Bickers . Dr [name] . Darrick Grinder, NP . Lyda Jester, PA . Audry Riles, PharmD   Please be sure to bring in all your medications bottles to every appointment.

## 2020-02-07 NOTE — Progress Notes (Signed)
ID:  [name], DOB Nov 21, 1936, MRN [id number]    Provider location: [place] [facility] Type of Visit: Established patient   PCP:  Burnard Bunting, MD  Cardiologist:  [name]  Chief Complaint: Shortness of breath   History of Present Illness: [name] is a 84 y.o. male who has history of CAD s/p CABG.  Cath in 11/09 showed that the SVG-distal RCA was patent, the CFX system was patent.  His LIMA was atretic and there were serial 60% and 80% stenoses in the native LAD.  He was managed medically.  Echo in 8/14 showed EF 55-60% with moderate MR and moderate TR.   He was initially noted to have atrial fibrillation in the summer of 2014. He was started on Xarelto and cardioverted to NSR in 8/14.  Recurrent atrial fibrillation was noted in 1/15, and he was cardioverted to NSR again.  This time, NSR did not hold long. By 5/15, he was in persistent atrial fibrillation.  I referred him to [facility] where he had atrial fibrillation ablation and Tikosyn loading.  Ranolazine was stopped due to risk of QT prolongation and Imdur was started as an anti-anginal instead.  Lexiscan Cardiolite in 11/15 showed no ischemia.    CTA chest done in 2/16 to look for evidence for PV stenosis post-AF ablation.  This showed mild short-segment narrowing of the left inferior pulmonary vein.   Patient was admitted in 5/18 with acute on chronic diastolic CHF.  He had been at the beach for [place] days and ate out a number of times, probably getting a significant sodium load.  No chest pain.  He was in normal sinus rhythm.  He was started on IV Lasix and diuresed.  Echo in 5/18 showed EF 65-70% with moderately dilated RV and normal systolic function, PASP 57 mmHg.  Lexiscan Cardiolite in 6/18 showed no significant perfusion defect.   He was admitted with NSTEMI in 3/20, had DES to ostial LAD and mid SVG-RCA.  Echo showed EF 45-50%.  He was in atypical atrial flutter persistently despite Tikosyn.   After his intervention, he was noted to have a radial artery pseudoaneurysm that was repaired by Dr. Trula Slade.   In 6/20, he had a redo atrial fibrillation ablation.  He was also noted to have complex flutter from multiple foci that was not ablated.  After the procedure, he has had considerable pain in the right leg at the cath site but also radiating down the leg, groin US showed no AV fistula or pseudoaneurysm.    In 10/20, patient held anticoagulation for 2 days for oral surgery and had a CVA presenting as right hand weakness.  He did not get tPA.  Echo in 10/20 showed EF 50-55%, mild LVH, moderately decreased RV function, small PFO. Carotid dopplers showed 1-39% bilateral stenosis. He was switched from Xarelto to Eliquis.   In 11/20, he went into persistent atrial fibrillation and underwent DCCV.   He returns for followup of CHF, atrial fibrillation, and CAD.  He has occasional episodes of atrial fibrillation with HR > 100.  HR excursion has been decreased by restarting nebivolol.  He is in NSR today alternating with junctional (versus ectopic atrial) rhythm.  Weight is stable.  He feels bad in atrial fibrillation but ok when in NSR.  No chest pain.  No dyspnea when in NSR.    ECG (personally reviewed): NSR alternating with ectopic atrial rhythm versus junctional rhythm, QTc 488.  Labs (8/12): K 4.1, creatinine 1.2, LDL 91, HDL 53 Labs (8/14): K 4.6, creatinine 1.1 Labs (11/14): K 4.6, creatinine 1.2, LDL 91, HDL 56 Labs (3/15): AST 38, ALT 32, TSH normal, BNP 237 Labs (7/15): LDL 96, HDL 48, K 4.2, creatinine 1.2, TSH normal, BNP 237, AST 38, ALT 32 Labs (8/15): LFTs normal Labs (11/15): K 4.2, creatinine 1.1, Mg 2.2, BNP 227 Labs (2/16): K 4, creatinine 1.32, BNP 261 Labs (6/18): K 3.9, creatinine 1.6 Labs (7/18): K 3.9, creatinine 1.5, BNP 242 Labs (12/18): K 3.8, creatinine 1.4, hgb 13.9, LDL 73, HDL 52 Labs (3/19): K 3.7, creatinine 1.4, LDL 76, HDL 42 Labs (6/19): K 4.1, creatinine  1.63 Labs (3/20): K 4.2, creatinine 1.29, LDL 53, HDL 47, hgb 13.8 Labs (4/20): K 4.3, creatinine 1.5 Labs (6/20): K 4.2, creatinine 1.84, hgb 12.4 Labs (7/20): K 4.5, creatinine 2.53 => 1.72 Labs (8/20): K 4.7, creatinine 1.5, Mg 2.4 Labs (12/20): LDL 79, K 4.9, creatinine 1.4  PMH: 1. CAD: 1st MI in 1988.  CABG 1996.  PCI to CFX in 2009.  LHC (11/09) SVG-dRCA patent, total occlusion RCA, patent CFX stent, atretic LIMA, serial 60 and 80% proximal LAD stenoses, EF 60% with basal inferior hypokinesis.  Myoview in 2011 with no ischemia or infarction.  Echo (10/12) with EF 55-60%, mild LVH, mild MR.  Lexiscan Cardiolite in 2013 with no ischemia or infarction.  Echo (8/14) with EF 55-60%, moderate MR, moderate TR, PA systolic pressure 35 mmHg.  Echo (4/15) with EF 60-65%, mild focal basal septal hypertrophy, inferior basal akinesis, mild MR.  Lexiscan cardiolite (11/15) with EF 61%, fixed basal inferoseptal defect, no ischemia.  - Lexiscan Cardiolite (6/18): EF 54%, no perfusion defect.  - NSTEMI 3/20, cath showed 95% ostial/proximal LAD, 60-70% mid LAD, totally occluded SVG-LCx, totally occluded RCA, 80-90% mid SVG-RCA.  Patient had DES to ostial-mid LAD and DES to SVG-RCA.  2. Raynauds syndrome 3. Post-polio syndrome 4. GERD with dilation of esophageal stricture in 12/12.  5. Hyperlipidemia 6. H/o CVAs: Brainstem stroke with Horner's syndrome.  Recurrent stroke in 10/20 with right hand weakness when off anticoagulation for 2 days.  - Carotid dopplers (10/20) with minimal stenosis.  7. Scoliosis. 8. H/o appendectomy 9. Herpes Zoster 10. Atrial fibrillation: DCCV to NSR in 8/14. DCCV to NSR in 1/15. Atrial fibrillation ablation 6/15 with Tikosyn loading (at [facility]).   - Redo atrial fibrillation ablation in 6/20.  Patient also noted to have complex flutter with several foci, not ablated.  - Zio patch (7/20): Primarily NSR with a few short SVT runs.  - DCCV 11/20.  11. PFTs (4/15) with FVC 59%, FEV1  54%, ratio 91%, DLCO 53% => moderate obstructive defect thought to be related to COPD and severe restrictive defect thought to be due to elevated left hemidiaphragm and post-polio syndrome.  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  Echo (5/18) with EF 65-70%, RV moderately dilated with normal systolic function, PASP 57 mmHg.  - Echo (3/20): EF 45-50%, mild LV dilation, basal inferolateral and inferior hypokinesis.  - Echo (10/20): EF 50-55%, mild LVH, basal inferior and inferoseptal hypokinesis, moderately decreased RV systolic function, small PFO.  13. CKD: Stage 3.  14. OSA: dental appliance.  15. Right radial artery pseudoaneurysm: post-cath in 3/20, s/p repair.  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 acetaminophen (TYLENOL) 500 MG tablet Take 500 mg by mouth every 8 (eight) hours as needed for mild pain or moderate pain.     [name]  albuterol (PROAIR HFA) 108 (90 BASE) MCG/ACT inhaler Inhale 2 puffs into the lungs every 4 (four) hours as needed for wheezing or shortness of breath (and prior to exercise). 1 Inhaler 3  . apixaban (ELIQUIS) 2.5 MG TABS tablet Take 1 tablet (2.5 mg total) by mouth 2 (two) times daily. 180 tablet 1  . dicyclomine (BENTYL) 10 MG capsule Take 10 mg by mouth 3 (three) times daily as needed for spasms.    [name] dofetilide (TIKOSYN) 250 MCG capsule TAKE 1 CAPSULE BY MOUTH EVERY 12 HOURS *NEED OFFICE VISIT* (Patient taking differently: Take 250 mcg by mouth every 12 (twelve) hours. ) 180 capsule 3  . ezetimibe (ZETIA) 10 MG tablet TAKE 1 TABLET BY MOUTH EVERY DAY (Patient taking differently: Take 10 mg by mouth every evening. ) 90 tablet 3  . famotidine (PEPCID) 40 MG tablet Take 1 tablet (40 mg total) by mouth at bedtime. 30 tablet 11  . furosemide (LASIX) 20 MG tablet Take 20 mg by mouth daily.    [name] lactobacillus acidophilus & bulgar (LACTINEX) chewable tablet Chew 2 tablets by mouth daily.     [name] loperamide (IMODIUM) 2 MG capsule  Take 2-4 mg by mouth as needed for diarrhea or loose stools.     [name] LORazepam (ATIVAN) 0.5 MG tablet Take 0.5 mg by mouth at bedtime.     . magnesium oxide (MAG-OX) 400 MG tablet Take 400 mg by mouth every evening.     . methocarbamol (ROBAXIN) 500 MG tablet Take 500 mg by mouth 3 (three) times daily as needed for muscle spasms.     . nebivolol (BYSTOLIC) 5 MG tablet Take 5 mg by mouth daily.    . nitroGLYCERIN (NITROSTAT) 0.4 MG SL tablet Place 1 tablet (0.4 mg total) under the tongue every 5 (five) minutes as needed for chest pain. 25 tablet 0  . potassium chloride SA (K-DUR) 20 MEQ tablet Take 1 tablet (20 mEq total) by mouth at bedtime. 90 tablet 3  . Probiotic Product (ALIGN) 4 MG CAPS Take 4 mg by mouth every evening.     . rosuvastatin (CRESTOR) 40 MG tablet TAKE 1 TABLET BY MOUTH EVERY DAY (Patient taking differently: Take 40 mg by mouth every evening. ) 90 tablet 3  . traZODone (DESYREL) 50 MG tablet Take 50 mg by mouth at bedtime.    . Vitamin D, Ergocalciferol, (DRISDOL) 50000 UNITS CAPS capsule Take 50,000 Units by mouth every 14 (fourteen) days. Take on 1st and 15th     No current facility-administered medications for this encounter.    Allergies:   Morphine and related and Tape   Social History:  The patient  reports that he quit smoking about 53 years ago. His smoking use included cigarettes. He has a 2.50 pack-year smoking history. He quit smokeless tobacco use about 71 years ago. He reports that he does not drink alcohol or use drugs.   Family History:  The patient's family history includes Cancer in his son; Heart Problems in his brother; Heart disease in his father, maternal grandfather, maternal grandmother, mother, paternal grandfather, and paternal grandmother; Prostate cancer in his paternal uncle.   ROS:  Please see the history of present illness.   All other systems are personally reviewed and negative.   Exam:   BP (!) 118/56   Pulse 64   Wt 55.1 kg (121 lb 6.4 oz)    SpO2 96%   BMI 19.59 kg/m  General: NAD Neck: No JVD, no thyromegaly or thyroid  nodule.  Lungs: Clear to auscultation bilaterally with normal respiratory effort. CV: Nondisplaced PMI.  Heart regular S1/S2, no S3/S4, no murmur.  No peripheral edema.  No carotid bruit.  Normal pedal pulses.  Abdomen: Soft, nontender, no hepatosplenomegaly, no distention.  Skin: Intact without lesions or rashes.  Neurologic: Alert and oriented x 3.  Psych: Normal affect. Extremities: No clubbing or cyanosis.  HEENT: Normal.    Recent Labs: 10/21/2019: ALT 21; TSH 1.123 11/13/2019: Platelets 257 11/20/2019: Hemoglobin 14.6 02/07/2020: BUN 17; Creatinine, Ser 1.56; Magnesium 2.3; Potassium 4.5; Sodium 138  Personally reviewed   Wt Readings from Last 3 Encounters:  02/07/20 55.1 kg (121 lb 6.4 oz)  12/13/19 53.1 kg (117 lb)  11/29/19 52.8 kg (116 lb 6.4 oz)      ASSESSMENT AND PLAN:  1. Atrial fibrillation: He has been on Tikosyn and is s/p atrial fibrillation ablation at [facility] on 06/25/14.  He has also had atypical atrial flutter. He had a redo atrial fibrillation ablation in 6/20.  Complex flutter was noted from several foci and was not ablated.  Zio patch from 7/20 showed predominantly NSR with short runs SVT.  DCCV in 11/20.  Currently seems to be mainly in NSR but with runs of atrial fibrillation.  - Continue Tikosyn.  QTc today is acceptable.  I will check BMET and Mg.  - Continue apixaban, this is appropriately dosed at 2.5 mg bid with age and low weight.   - Continue nebivolol, this seems to be controlling his HR.   2. CAD: H/o CABG.  He had NSTEMI in 3/20 with DES to ostial/proximal LAD and SVG-RCA.  No further chest pain.  - He has stopped ASA.   - Continue Plavix until the end of 3/21.  - He will continue apixaban long-term for atrial fibrillation/flutter.  - he is on statin and Zetia.  3. Hyperlipidemia: He is taking Crestor 40 mg daily and Zetia 10 mg daily. LDL ok in 12/20.     4.  Ischemic cardiomyopathy/primarily diastolic CHF: Echo in 0000000 with EF 45-50%, echo in 10/20 with EF 50-55%.  NYHA class II symptoms.  He is not volume overloaded on exam. - Continue Lasix 20 mg daily, BMET today.  - He is off losartan with elevated creatinine.  - Continue nebivolol.  5. Pulmonary: PFTs suggested both a restrictive and obstructive defect.  The restrictive defect is likely from post-polio syndrome and elevated left hemidiaphragm.  Mr Forlenza never smoked much, so the obstructive defect is more surprising.  6. OSA: Severe, likely potentiates his atrial fibrillation. He has had a hard time tolerating CPAP.   - Now using an oral appliance.   7. CVA: 10/20, in setting of holding anticoagulation for 2 days.  Avoid holding anticoagulation in the future.  Mild residual effect on right hand dexterity.   Followup with me in 2 months.   Signed, [name], MD  02/07/2020  [place] [address] [facility] [place] [address] [phone] (office) [phone] (fax)

## 2020-02-12 DIAGNOSIS — D485 Neoplasm of uncertain behavior of skin: Secondary | ICD-10-CM | POA: Diagnosis not present

## 2020-02-12 DIAGNOSIS — Z85828 Personal history of other malignant neoplasm of skin: Secondary | ICD-10-CM | POA: Diagnosis not present

## 2020-02-12 DIAGNOSIS — L853 Xerosis cutis: Secondary | ICD-10-CM | POA: Diagnosis not present

## 2020-02-12 DIAGNOSIS — L821 Other seborrheic keratosis: Secondary | ICD-10-CM | POA: Diagnosis not present

## 2020-02-12 DIAGNOSIS — L814 Other melanin hyperpigmentation: Secondary | ICD-10-CM | POA: Diagnosis not present

## 2020-02-12 DIAGNOSIS — L570 Actinic keratosis: Secondary | ICD-10-CM | POA: Diagnosis not present

## 2020-02-22 ENCOUNTER — Other Ambulatory Visit: Payer: Self-pay

## 2020-02-22 MED ORDER — FAMOTIDINE 40 MG PO TABS: 40.0000 mg | ORAL_TABLET | Freq: Every day | ORAL | 0 refills | Status: DC

## 2020-03-31 ENCOUNTER — Other Ambulatory Visit: Payer: Self-pay

## 2020-03-31 MED ORDER — FAMOTIDINE 40 MG PO TABS: 40.0000 mg | ORAL_TABLET | Freq: Every day | ORAL | 3 refills | Status: DC

## 2020-04-10 ENCOUNTER — Other Ambulatory Visit: Payer: Self-pay

## 2020-04-10 ENCOUNTER — Encounter (HOSPITAL_COMMUNITY): Payer: Self-pay | Admitting: Cardiology

## 2020-04-10 ENCOUNTER — Ambulatory Visit (HOSPITAL_COMMUNITY)
Admission: RE | Admit: 2020-04-10 | Discharge: 2020-04-10 | Disposition: A | Discharge status: Home / Self Care | Payer: Medicare Other | Source: Ambulatory Visit | Attending: Cardiology | Admitting: Cardiology

## 2020-04-10 VITALS — BP 128/60 | HR 63 | Wt 123.0 lb

## 2020-04-10 DIAGNOSIS — M419 Scoliosis, unspecified: Secondary | ICD-10-CM | POA: Diagnosis not present

## 2020-04-10 DIAGNOSIS — N183 Chronic kidney disease, stage 3 unspecified: Secondary | ICD-10-CM | POA: Diagnosis not present

## 2020-04-10 DIAGNOSIS — Z885 Allergy status to narcotic agent status: Secondary | ICD-10-CM | POA: Insufficient documentation

## 2020-04-10 DIAGNOSIS — G14 Postpolio syndrome: Secondary | ICD-10-CM | POA: Diagnosis not present

## 2020-04-10 DIAGNOSIS — Z7902 Long term (current) use of antithrombotics/antiplatelets: Secondary | ICD-10-CM | POA: Insufficient documentation

## 2020-04-10 DIAGNOSIS — Z888 Allergy status to other drugs, medicaments and biological substances status: Secondary | ICD-10-CM | POA: Insufficient documentation

## 2020-04-10 DIAGNOSIS — Z8249 Family history of ischemic heart disease and other diseases of the circulatory system: Secondary | ICD-10-CM | POA: Insufficient documentation

## 2020-04-10 DIAGNOSIS — I480 Paroxysmal atrial fibrillation: Secondary | ICD-10-CM

## 2020-04-10 DIAGNOSIS — I5032 Chronic diastolic (congestive) heart failure: Secondary | ICD-10-CM | POA: Diagnosis not present

## 2020-04-10 DIAGNOSIS — Z79899 Other long term (current) drug therapy: Secondary | ICD-10-CM | POA: Insufficient documentation

## 2020-04-10 DIAGNOSIS — I2510 Atherosclerotic heart disease of native coronary artery without angina pectoris: Secondary | ICD-10-CM | POA: Insufficient documentation

## 2020-04-10 DIAGNOSIS — Z955 Presence of coronary angioplasty implant and graft: Secondary | ICD-10-CM | POA: Insufficient documentation

## 2020-04-10 DIAGNOSIS — Z951 Presence of aortocoronary bypass graft: Secondary | ICD-10-CM | POA: Insufficient documentation

## 2020-04-10 DIAGNOSIS — E785 Hyperlipidemia, unspecified: Secondary | ICD-10-CM | POA: Insufficient documentation

## 2020-04-10 DIAGNOSIS — I255 Ischemic cardiomyopathy: Secondary | ICD-10-CM | POA: Diagnosis not present

## 2020-04-10 DIAGNOSIS — G4733 Obstructive sleep apnea (adult) (pediatric): Secondary | ICD-10-CM | POA: Diagnosis not present

## 2020-04-10 DIAGNOSIS — I252 Old myocardial infarction: Secondary | ICD-10-CM | POA: Insufficient documentation

## 2020-04-10 DIAGNOSIS — K219 Gastro-esophageal reflux disease without esophagitis: Secondary | ICD-10-CM | POA: Diagnosis not present

## 2020-04-10 DIAGNOSIS — I69398 Other sequelae of cerebral infarction: Secondary | ICD-10-CM | POA: Diagnosis not present

## 2020-04-10 DIAGNOSIS — Z87891 Personal history of nicotine dependence: Secondary | ICD-10-CM | POA: Diagnosis not present

## 2020-04-10 DIAGNOSIS — Z7901 Long term (current) use of anticoagulants: Secondary | ICD-10-CM | POA: Insufficient documentation

## 2020-04-10 DIAGNOSIS — I4891 Unspecified atrial fibrillation: Secondary | ICD-10-CM | POA: Diagnosis not present

## 2020-04-10 DIAGNOSIS — G902 Horner's syndrome: Secondary | ICD-10-CM | POA: Diagnosis not present

## 2020-04-10 LAB — MAGNESIUM: Magnesium: 2.4 mg/dL (ref 1.7–2.4)

## 2020-04-10 MED ORDER — FUROSEMIDE 20 MG PO TABS: ORAL_TABLET | ORAL | 5 refills | Status: DC

## 2020-04-10 MED ORDER — NEBIVOLOL HCL 5 MG PO TABS: 2.5000 mg | ORAL_TABLET | Freq: Two times a day (BID) | ORAL | 4 refills | Status: DC

## 2020-04-10 NOTE — Progress Notes (Signed)
ID:  [name], DOB 1936/01/19, MRN [id number]    Provider location: [name] Type of Visit: Established patient   PCP:  Burnard Bunting, MD  Cardiologist:  [name]   History of Present Illness: [name] is a 84 y.o. male who has history of CAD s/p CABG.  Cath in 11/09 showed that the SVG-distal RCA was patent, the CFX system was patent.  His LIMA was atretic and there were serial 60% and 80% stenoses in the native LAD.  He was managed medically.  Echo in 8/14 showed EF 55-60% with moderate Jimmy and moderate TR.   He was initially noted to have atrial fibrillation in the summer of 2014. He was started on Xarelto and cardioverted to NSR in 8/14.  Recurrent atrial fibrillation was noted in 1/15, and he was cardioverted to NSR again.  This time, NSR did not hold long. By 5/15, he was in persistent atrial fibrillation.  I referred him to [name] where he had atrial fibrillation ablation and Tikosyn loading.  Ranolazine was stopped due to risk of QT prolongation and Imdur was started as an anti-anginal instead.  Lexiscan Cardiolite in 11/15 showed no ischemia.    CTA chest done in 2/16 to look for evidence for PV stenosis post-AF ablation.  This showed mild short-segment narrowing of the left inferior pulmonary vein.   Patient was admitted in 5/18 with acute on chronic diastolic CHF.  He had been at the beach for [place] days and ate out a number of times, probably getting a significant sodium load.  No chest pain.  He was in normal sinus rhythm.  He was started on IV Lasix and diuresed.  Echo in 5/18 showed EF 65-70% with moderately dilated RV and normal systolic function, PASP 57 mmHg.  Lexiscan Cardiolite in 6/18 showed no significant perfusion defect.   He was admitted with NSTEMI in 3/20, had DES to ostial LAD and mid SVG-RCA.  Echo showed EF 45-50%.  He was in atypical atrial flutter persistently despite Tikosyn.  After his intervention, he was noted to  have a radial artery pseudoaneurysm that was repaired by Dr. Trula Slade.   In 6/20, he had a redo atrial fibrillation ablation.  He was also noted to have complex flutter from multiple foci that was not ablated.  After the procedure, he has had considerable pain in the right leg at the cath site but also radiating down the leg, groin US showed no AV fistula or pseudoaneurysm.    In 10/20, patient held anticoagulation for 2 days for oral surgery and had a CVA presenting as right hand weakness.  He did not get tPA.  Echo in 10/20 showed EF 50-55%, mild LVH, moderately decreased RV function, small PFO. Carotid dopplers showed 1-39% bilateral stenosis. He was switched from Xarelto to Eliquis.   In 11/20, he went into persistent atrial fibrillation and underwent DCCV.   He returns for followup of CHF, atrial fibrillation, and CAD.  He feels occasional episodes of atrial fibrillation.  He is in NSR today. He is short of breath if he walks fast, no dyspnea if he takes his time. Mild lightheadedness with standing at times and mild lightheadedness with bending over.  He still works 3 days a week. He is not exercising as much as in the past.   ECG (personally reviewed): NSR, PAC, QTc 468 msec.   Labs (8/12): K 4.1, creatinine 1.2, LDL 91, HDL 53 Labs (8/14): K  4.6, creatinine 1.1 Labs (11/14): K 4.6, creatinine 1.2, LDL 91, HDL 56 Labs (3/15): AST 38, ALT 32, TSH normal, BNP 237 Labs (7/15): LDL 96, HDL 48, K 4.2, creatinine 1.2, TSH normal, BNP 237, AST 38, ALT 32 Labs (8/15): LFTs normal Labs (11/15): K 4.2, creatinine 1.1, Mg 2.2, BNP 227 Labs (2/16): K 4, creatinine 1.32, BNP 261 Labs (6/18): K 3.9, creatinine 1.6 Labs (7/18): K 3.9, creatinine 1.5, BNP 242 Labs (12/18): K 3.8, creatinine 1.4, hgb 13.9, LDL 73, HDL 52 Labs (3/19): K 3.7, creatinine 1.4, LDL 76, HDL 42 Labs (6/19): K 4.1, creatinine 1.63 Labs (3/20): K 4.2, creatinine 1.29, LDL 53, HDL 47, hgb 13.8 Labs (4/20): K 4.3, creatinine  1.5 Labs (6/20): K 4.2, creatinine 1.84, hgb 12.4 Labs (7/20): K 4.5, creatinine 2.53 => 1.72 Labs (8/20): K 4.7, creatinine 1.5, Mg 2.4 Labs (12/20): LDL 79, K 4.9, creatinine 1.4 Labs (2/21): K 4.5, creatinine 1.56  PMH: 1. CAD: 1st MI in 1988.  CABG 1996.  PCI to CFX in 2009.  LHC (11/09) SVG-dRCA patent, total occlusion RCA, patent CFX stent, atretic LIMA, serial 60 and 80% proximal LAD stenoses, EF 60% with basal inferior hypokinesis.  Myoview in 2011 with no ischemia or infarction.  Echo (10/12) with EF 55-60%, mild LVH, mild Jimmy.  Lexiscan Cardiolite in 2013 with no ischemia or infarction.  Echo (8/14) with EF 55-60%, moderate Jimmy, moderate TR, PA systolic pressure 35 mmHg.  Echo (4/15) with EF 60-65%, mild focal basal septal hypertrophy, inferior basal akinesis, mild Jimmy.  Lexiscan cardiolite (11/15) with EF 61%, fixed basal inferoseptal defect, no ischemia.  - Lexiscan Cardiolite (6/18): EF 54%, no perfusion defect.  - NSTEMI 3/20, cath showed 95% ostial/proximal LAD, 60-70% mid LAD, totally occluded SVG-LCx, totally occluded RCA, 80-90% mid SVG-RCA.  Patient had DES to ostial-mid LAD and DES to SVG-RCA.  2. Raynauds syndrome 3. Post-polio syndrome 4. GERD with dilation of esophageal stricture in 12/12.  5. Hyperlipidemia 6. H/o CVAs: Brainstem stroke with Horner's syndrome.  Recurrent stroke in 10/20 with right hand weakness when off anticoagulation for 2 days.  - Carotid dopplers (10/20) with minimal stenosis.  7. Scoliosis. 8. H/o appendectomy 9. Herpes Zoster 10. Atrial fibrillation: DCCV to NSR in 8/14. DCCV to NSR in 1/15. Atrial fibrillation ablation 6/15 with Tikosyn loading (at [facility]).   - Redo atrial fibrillation ablation in 6/20.  Patient also noted to have complex flutter with several foci, not ablated.  - Zio patch (7/20): Primarily NSR with a few short SVT runs.  - DCCV 11/20.  11. PFTs (4/15) with FVC 59%, FEV1 54%, ratio 91%, DLCO 53% => moderate obstructive defect  thought to be related to COPD and severe restrictive defect thought to be due to elevated left hemidiaphragm and post-polio syndrome.  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  Echo (5/18) with EF 65-70%, RV moderately dilated with normal systolic function, PASP 57 mmHg.  - Echo (3/20): EF 45-50%, mild LV dilation, basal inferolateral and inferior hypokinesis.  - Echo (10/20): EF 50-55%, mild LVH, basal inferior and inferoseptal hypokinesis, moderately decreased RV systolic function, small PFO.  13. CKD: Stage 3.  14. OSA: dental appliance.  15. Right radial artery pseudoaneurysm: post-cath in 3/20, s/p repair.  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 acetaminophen (TYLENOL) 500 MG tablet Take 500 mg by mouth every 8 (eight) hours as needed for mild pain or moderate pain.     [name] albuterol (PROAIR HFA)  108 (90 BASE) MCG/ACT inhaler Inhale 2 puffs into the lungs every 4 (four) hours as needed for wheezing or shortness of breath (and prior to exercise). 1 Inhaler 3  . apixaban (ELIQUIS) 2.5 MG TABS tablet Take 1 tablet (2.5 mg total) by mouth 2 (two) times daily. 180 tablet 1  . dicyclomine (BENTYL) 10 MG capsule Take 10 mg by mouth 3 (three) times daily as needed for spasms.    [name] dofetilide (TIKOSYN) 250 MCG capsule Take 250 mcg by mouth 2 (two) times daily.    [name] ezetimibe (ZETIA) 10 MG tablet Take 10 mg by mouth every evening.    . famotidine (PEPCID) 40 MG tablet Take 1 tablet (40 mg total) by mouth at bedtime. 90 tablet 3  . furosemide (LASIX) 20 MG tablet Take 40mg  daily alternating with 20mg  every other day 45 tablet 5  . lactobacillus acidophilus & bulgar (LACTINEX) chewable tablet Chew 2 tablets by mouth daily.     [name] loperamide (IMODIUM) 2 MG capsule Take 2-4 mg by mouth as needed for diarrhea or loose stools.     [name] LORazepam (ATIVAN) 0.5 MG tablet Take 0.5 mg by mouth at bedtime.     . magnesium oxide (MAG-OX) 400 MG tablet Take 400 mg by  mouth every evening.     . methocarbamol (ROBAXIN) 500 MG tablet Take 500 mg by mouth 3 (three) times daily as needed for muscle spasms.     . nebivolol (BYSTOLIC) 5 MG tablet Take 0.5 tablets (2.5 mg total) by mouth in the morning and at bedtime. 30 tablet 4  . nitroGLYCERIN (NITROSTAT) 0.4 MG SL tablet Place 1 tablet (0.4 mg total) under the tongue every 5 (five) minutes as needed for chest pain. 25 tablet 0  . potassium chloride SA (K-DUR) 20 MEQ tablet Take 1 tablet (20 mEq total) by mouth at bedtime. 90 tablet 3  . Probiotic Product (ALIGN) 4 MG CAPS Take 4 mg by mouth every evening.     . rosuvastatin (CRESTOR) 40 MG tablet Take 40 mg by mouth every evening.    . testosterone (ANDROGEL) 50 MG/5GM (1%) GEL Place 5 g onto the skin daily.    . traZODone (DESYREL) 50 MG tablet Take 50 mg by mouth at bedtime.    . Vitamin D, Ergocalciferol, (DRISDOL) 50000 UNITS CAPS capsule Take 50,000 Units by mouth every 14 (fourteen) days. Take on 1st and 15th     No current facility-administered medications for this encounter.    Allergies:   Morphine and related and Tape   Social History:  The patient  reports that he quit smoking about 53 years ago. His smoking use included cigarettes. He has a 2.50 pack-year smoking history. He quit smokeless tobacco use about 71 years ago. He reports that he does not drink alcohol or use drugs.   Family History:  The patient's family history includes Cancer in his son; Heart Problems in his brother; Heart disease in his father, maternal grandfather, maternal grandmother, mother, paternal grandfather, and paternal grandmother; Prostate cancer in his paternal uncle.   ROS:  Please see the history of present illness.   All other systems are personally reviewed and negative.   Exam:   BP 128/60   Pulse 63   Wt 55.8 kg (123 lb)   SpO2 99%   BMI 19.85 kg/m  General: NAD Neck: JVP 8-9 cm, no thyromegaly or thyroid nodule.  Lungs: Clear to auscultation bilaterally  with normal respiratory effort. CV: Nondisplaced PMI.  Heart regular S1/S2, no S3/S4, no murmur.  No peripheral edema.  No carotid bruit.  Normal pedal pulses.  Abdomen: Soft, nontender, no hepatosplenomegaly, no distention.  Skin: Intact without lesions or rashes.  Neurologic: Alert and oriented x 3.  Psych: Normal affect. Extremities: No clubbing or cyanosis.  HEENT: Normal.    Recent Labs: 10/21/2019: ALT 21; TSH 1.123 11/13/2019: Platelets 257 11/20/2019: Hemoglobin 14.6 02/07/2020: BUN 17; Creatinine, Ser 1.56; Potassium 4.5; Sodium 138 04/10/2020: Magnesium 2.4  Personally reviewed   Wt Readings from Last 3 Encounters:  04/10/20 55.8 kg (123 lb)  02/07/20 55.1 kg (121 lb 6.4 oz)  12/13/19 53.1 kg (117 lb)      ASSESSMENT AND PLAN:  1. Atrial fibrillation: He has been on Tikosyn and is s/p atrial fibrillation ablation at [facility] on 06/25/14.  He has also had atypical atrial flutter. He had a redo atrial fibrillation ablation in 6/20.  Complex flutter was noted from several foci and was not ablated.  Zio patch from 7/20 showed predominantly NSR with short runs SVT.  DCCV in 11/20.  Currently seems to be mainly in NSR but with runs of atrial fibrillation.  - Continue Tikosyn.  QTc today is acceptable.  I will check BMET and Mg.  - Continue apixaban, this is appropriately dosed at 2.5 mg bid with age and low weight.   - Nebivolol is controlling his HR well, but with orthostatic symptoms, I will change it from 5 mg once daily to 2.5 mg bid.   2. CAD: H/o CABG.  He had NSTEMI in 3/20 with DES to ostial/proximal LAD and SVG-RCA.  No further chest pain.  - He is now off plavix.   - He will continue apixaban long-term for atrial fibrillation/flutter.  - he is on statin and Zetia.  3. Hyperlipidemia: He is taking Crestor 40 mg daily and Zetia 10 mg daily. LDL ok in 12/20.     4. Ischemic cardiomyopathy/primarily diastolic CHF: Echo in 0000000 with EF 45-50%, echo in 10/20 with EF 50-55%.  NYHA  class II symptoms.  Mild volume overload on exam and weight is up 2 lbs.  - Increase Lasix to 40 mg daily alternating with 20 mg daily. BMET today and again in 10 days.   - He is off losartan with elevated creatinine.  - Continue nebivolol.  5. Pulmonary: PFTs suggested both a restrictive and obstructive defect.  The restrictive defect is likely from post-polio syndrome and elevated left hemidiaphragm.  [name] never smoked much, so the obstructive defect is more surprising.  6. OSA: Severe, likely potentiates his atrial fibrillation. He has had a hard time tolerating CPAP.   - Now using an oral appliance.   7. CVA: 10/20, in setting of holding anticoagulation for 2 days.  Avoid holding anticoagulation in the future.  Mild residual effect on right hand dexterity.   Followup with me in 3 months.   Signed, [name], MD  04/10/2020  [place] [address] [facility] [place] [address] [phone] (office) [phone] (fax)

## 2020-04-10 NOTE — Patient Instructions (Addendum)
INCREASE Lasix to 40mg  (2 tabs) daily alternating with 20mg  (1 tab) every other day   CHANGE- Take Bystolic 2.5mg  (1/2 tab) in the morning and 2.5mg  (1/2 tab) in the evening   Labs today and repeat in 10 days We will only contact you if something comes back abnormal or we need to make some changes. Otherwise no news is good news!    Your physician recommends that you schedule a follow-up appointment in: 3 months with Dr Aundra Dubin  July Garage code: [id number]  Please call office at [phone] option 2 if you have any questions or concerns.    At the [facility], you and your health needs are our priority. As part of our continuing mission to provide you with exceptional heart care, we have created designated Provider Care Teams. These Care Teams include your primary Cardiologist (physician) and Advanced Practice Providers (APPs- Physician Assistants and Nurse Practitioners) who all work together to provide you with the care you need, when you need it.   You may see any of the following providers on your designated Care Team at your next follow up: [name] Dr Glori Bickers . Dr [name] . Darrick Grinder, NP . Lyda Jester, PA . Audry Riles, PharmD   Please be sure to bring in all your medications bottles to every appointment.

## 2020-04-14 DIAGNOSIS — L988 Other specified disorders of the skin and subcutaneous tissue: Secondary | ICD-10-CM | POA: Diagnosis not present

## 2020-04-14 DIAGNOSIS — D485 Neoplasm of uncertain behavior of skin: Secondary | ICD-10-CM | POA: Diagnosis not present

## 2020-04-14 DIAGNOSIS — Z85828 Personal history of other malignant neoplasm of skin: Secondary | ICD-10-CM | POA: Diagnosis not present

## 2020-04-16 ENCOUNTER — Other Ambulatory Visit (HOSPITAL_COMMUNITY): Payer: Self-pay | Admitting: Physician Assistant

## 2020-04-16 NOTE — Telephone Encounter (Signed)
Pt last saw Dr Aundra Dubin 04/10/20, last labs 02/07/20 Creat 1.56, age 84, weight 55.8kg, based on specified criteria pt is on appropriate dosage of Eliquis 2.5mg  BID.  Will refill rx.

## 2020-04-21 ENCOUNTER — Ambulatory Visit (HOSPITAL_COMMUNITY)
Admission: RE | Admit: 2020-04-21 | Discharge: 2020-04-21 | Disposition: A | Discharge status: Home / Self Care | Payer: Medicare Other | Source: Ambulatory Visit | Attending: Cardiology | Admitting: Cardiology

## 2020-04-21 ENCOUNTER — Other Ambulatory Visit: Payer: Self-pay

## 2020-04-21 DIAGNOSIS — I5032 Chronic diastolic (congestive) heart failure: Secondary | ICD-10-CM | POA: Diagnosis not present

## 2020-04-21 LAB — BASIC METABOLIC PANEL
Anion gap: 8 (ref 5–15)
BUN: 24 mg/dL — ABNORMAL HIGH (ref 8–23)
CO2: 25 mmol/L (ref 22–32)
Calcium: 10.2 mg/dL (ref 8.9–10.3)
Chloride: 105 mmol/L (ref 98–111)
Creatinine, Ser: 1.52 mg/dL — ABNORMAL HIGH (ref 0.61–1.24)
GFR calc Af Amer: 49 mL/min — ABNORMAL LOW (ref >60)
GFR calc non Af Amer: 42 mL/min — ABNORMAL LOW (ref >60)
Glucose, Bld: 138 mg/dL — ABNORMAL HIGH (ref 70–99)
Potassium: 4.5 mmol/L (ref 3.5–5.1)
Sodium: 138 mmol/L (ref 135–145)

## 2020-04-29 ENCOUNTER — Other Ambulatory Visit (HOSPITAL_COMMUNITY): Payer: Self-pay | Admitting: Cardiology

## 2020-05-21 ENCOUNTER — Other Ambulatory Visit: Payer: Self-pay | Admitting: Cardiology

## 2020-06-08 ENCOUNTER — Other Ambulatory Visit (HOSPITAL_COMMUNITY): Payer: Self-pay | Admitting: Cardiology

## 2020-06-10 ENCOUNTER — Other Ambulatory Visit: Payer: Self-pay

## 2020-06-10 ENCOUNTER — Encounter: Payer: Self-pay | Admitting: Cardiology

## 2020-06-10 ENCOUNTER — Ambulatory Visit (INDEPENDENT_AMBULATORY_CARE_PROVIDER_SITE_OTHER): Payer: Medicare Other | Admitting: Cardiology

## 2020-06-10 VITALS — BP 110/68 | HR 110 | Ht 66.0 in | Wt 123.0 lb

## 2020-06-10 DIAGNOSIS — I484 Atypical atrial flutter: Secondary | ICD-10-CM

## 2020-06-10 NOTE — Progress Notes (Signed)
Electrophysiology Office Note   Date:  06/10/2020   ID:  [name], DOB 02-14-1936, MRN [id number]  PCP:  Burnard Bunting, MD  Cardiologist:  Aundra Dubin Primary Electrophysiologist:  Raven Harmes Meredith Leeds, MD    No chief complaint on file.    History of Present Illness: [name] is a 84 y.o. male who is being seen today for the evaluation of atrial fibrillation/flutter at the request of Burnard Bunting, MD. Presenting today for electrophysiology evaluation.  He has a history of persistent atrial fibrillation, coronary disease, COPD, CVA, OSA, hypertension, hyperlipidemia.  He was diagnosed with atrial fibrillation several years ago and is status post ablation in 2015 by Dr. Lehman Prom.  He had been on Tikosyn since that time.  On 219 he was in an ectopic atrial rhythm with 2-1 heart block and was cardioverted 12/11/2018.  He has sleep apnea and is on BiPAP.  He was seen by Dr. Radford Pax who found him to be out of rhythm and referred him to atrial fibrillation clinic.  His EKG appeared to be consistent with atypical atrial flutter.  He had atrial fibrillation/flutter ablation 06/08/2019.  Since that time, it appears that he has maintained sinus rhythm.  He did wear a cardiac monitor which showed short episodes of SVT.  He has been going to cardiac rehab, though on their 3-lead ECG they have been concerned and have not performed rehab a few times.  He does have episodes where he feels weak and fatigued.  During these episodes, his heart rates are in the 60s.  Today, denies symptoms of palpitations, chest pain, shortness of breath, orthopnea, PND, lower extremity edema, claudication, dizziness, presyncope, syncope, bleeding, or neurologic sequela. The patient is tolerating medications without difficulties.  He overall feels well.  He does note that he is somewhat fatigued towards the end of the day.  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  He says that he needs to take a nap when he  gets home from work.  This is different from how he usually has felt.  Him and his wife said that his heart rates are in the 80s to 90s usually at home.  They are.  Atrial flutter.   Past Medical History:  Diagnosis Date   Abnormal nuclear cardiac imaging test March 2011   Has positive EKG response, no perfusion defect and normal EF   Adenomatous colon polyp 12/2002   Arrhythmia    Atrial fibrillation ([place])    a. s/p rfca;  b. chronic tikosyn and xarelto.   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 04/2017 Echo: EF 65-70%, restrictive physiology, mildly dil LA.   Complication of anesthesia    COPD (chronic obstructive pulmonary disease) ([place])    Coronary artery disease    First MI in 1988. PCI in 1996 with subsequent CABG in 1996 due to restenosis. S/P stents to LCX in 2009. Noted to have residual disease in the LAD in a diffuse manner and atretic LIMA graft. He is managed medically.    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31   OSA (obstructive sleep apnea) 10/16/2018   Severe OSA with AHI 37.2/hr on BiPAP   Persistent atrial fibrillation ([place])    Polio    age 38   PVC (premature ventricular contraction)    Scoliosis    mild   Past Surgical History:  Procedure Laterality Date   ABLATION     done for a fib   APPENDECTOMY     Age 57   ATRIAL FIBRILLATION ABLATION N/A 06/08/2019   Procedure:  ATRIAL FIBRILLATION ABLATION;  Surgeon: Constance Haw, MD;  Location: [place] [facility];  Service: Cardiovascular;  Laterality: N/A;   CARDIAC CATHETERIZATION     CARDIOVERSION N/A 08/22/2013   Procedure: CARDIOVERSION;  Surgeon: Carlena Bjornstad, MD;  Location: [place];  Service: Cardiovascular;  Laterality: N/A;   CARDIOVERSION N/A 01/16/2014   Procedure: CARDIOVERSION;  Surgeon: Lelon Perla, MD;  Location[facility] END[facility]COPY;  Service: Cardiovascular;  Laterality: N/A;   CARDIOVERSION N/A 12/11/2018   Procedure: CARDIOVERSION;  Surgeon: Larey Dresser, MD;  Location: [facility] ENDOSCOPY;  Service: Cardiovascular;  Laterality: N/A;   CARDIOVERSION N/A 11/20/2019   Procedure: CARDIOVERSION;  Surgeon: [name], MD;  Location: [facility] ENDOSCOPY;  Service: Endoscopy;  Laterality: N/A;   COLONOSCOPY  2008   CORONARY ARTERY BYPASS GRAFT  1996   with a LIMA to the LAD, SVG to RCA and SVG to OM   CORONARY STENT INTERVENTION N/A 03/26/2019   Procedure: CORONARY STENT INTERVENTION;  Surgeon: Jettie Booze, MD;  Location: [place] [facility];  Service: Cardiovascular;  Laterality: N/A;   CORONARY STENT PLACEMENT  1996   LCX   EMBOLECTOMY Right 05/09/2019   Procedure: Embolectomy of right radial artery;  Surgeon: Serafina Mitchell, MD;  Location: [place];  Service: Vascular;  Laterality: Right;   ESOPHAGEAL DILATION     EYE SURGERY     FALSE ANEURYSM REPAIR Right 05/09/2019   Procedure: REPAIR FALSE ANEURYSM RIGHT RADIAL;  Surgeon: Serafina Mitchell, MD;  Location: [place];  Service: Vascular;  Laterality: Right;   LEFT HEART CATH AND CORS/GRAFTS ANGIOGRAPHY N/A 03/26/2019   Procedure: LEFT HEART CATH AND CORS/GRAFTS ANGIOGRAPHY;  Surgeon: Larey Dresser, MD;  Location: [place] [facility];  Service: Cardiovascular;  Laterality: N/A;   POLYPECTOMY     TONSILLECTOMY     UPPER GASTROINTESTINAL ENDOSCOPY     esophegeal dilation     Current Outpatient Medications    Medication Sig Dispense Refill   acetaminophen (TYLENOL) 500 MG tablet Take 500 mg by mouth every 8 (eight) hours as needed for mild pain or moderate pain.      albuterol (PROAIR HFA) 108 (90 BASE) MCG/ACT inhaler Inhale 2 puffs into the lungs every 4 (four) hours as needed for wheezing or shortness of breath (and prior to exercise). 1 Inhaler 3   dicyclomine (BENTYL) 10 MG capsule Take 10 mg by mouth 3 (three) times daily as needed for spasms.     dofetilide (TIKOSYN) 250 MCG capsule Take 250 mcg by mouth 2 (two) times daily.     ELIQUIS 2.5 MG TABS tablet TAKE 1 TABLET BY MOUTH TWICE A DAY 180 tablet 1   ezetimibe (ZETIA) 10 MG  tablet TAKE 1 TABLET BY MOUTH EVERY DAY 90 tablet 3   famotidine (PEPCID) 40 MG tablet Take 1 tablet (40 mg total) by mouth at bedtime. 90 tablet 3   furosemide (LASIX) 20 MG tablet Take 40mg  daily alternating with 20mg  every other day 45 tablet 5   KLOR-CON M20 20 MEQ tablet TAKE 1 TABLET BY MOUTH AT BEDTIME 90 tablet 3   lactobacillus acidophilus & bulgar (LACTINEX) chewable tablet Chew 2 tablets by mouth daily.      loperamide (IMODIUM) 2 MG capsule Take 2-4 mg by mouth as needed for diarrhea or loose stools.      LORazepam (ATIVAN) 0.5 MG tablet Take 0.5 mg by mouth at bedtime.      magnesium oxide (MAG-OX) 400 MG tablet Take 400 mg by mouth every evening.      methocarbamol (ROBAXIN) 500 MG tablet Take 500 mg by mouth 3 (three) times daily as needed for muscle spasms.      Probiotic Product (ALIGN) 4 MG CAPS Take 4 mg by mouth every evening.      rosuvastatin (CRESTOR) 40 MG tablet TAKE 1 TABLET BY MOUTH EVERY DAY 90 tablet 3   testosterone (ANDROGEL) 50 MG/5GM (1%) GEL Place 5 g onto the skin daily.     traZODone (DESYREL) 50 MG tablet Take 50 mg by mouth at bedtime.     Vitamin D, Ergocalciferol, (DRISDOL) 50000 UNITS CAPS capsule Take 50,000 Units by mouth every 14 (fourteen) days. Take on 1st and 15th     nitroGLYCERIN (NITROSTAT) 0.4 MG SL  tablet Place 1 tablet (0.4 mg total) under the tongue every 5 (five) minutes as needed for chest pain. 25 tablet 0   No current facility-administered medications for this visit.    Allergies:   Morphine and related and Tape   Social History:  The patient  reports that he quit smoking about 53 years ago. His smoking use included cigarettes. He has a 2.50 pack-year smoking history. He quit smokeless tobacco use about 71 years ago. He reports that he does not drink alcohol and does not use drugs.   Family History:  The patient's family history includes Cancer in his son; Heart Problems in his brother; Heart disease in his father, maternal grandfather, maternal grandmother, mother, paternal grandfather, and paternal grandmother; Prostate cancer in his paternal uncle.    ROS:  Please see the history of present illness.   Otherwise, review of systems is positive for none.   All other systems are reviewed and negative.   PHYSICAL EXAM: VS:  BP 110/68    Pulse (!) 110    Ht 5\' 6"  (1.676 m)    Wt 123 lb (55.8 kg)    BMI 19.85 kg/m  , BMI Body mass index is 19.85 kg/m. GEN: Well nourished, well developed, in no acute distress  HEENT: normal  Neck: no JVD, carotid bruits, or masses Cardiac: Tachycardic, regular; no murmurs, rubs, or gallops,no edema  Respiratory:  clear to auscultation bilaterally, normal work of breathing GI: soft, nontender, nondistended, + BS MS: no deformity or atrophy  Skin: warm and dry Neuro:  Strength and sensation are intact Psych: euthymic mood, full affect  EKG:  EKG is ordered today. Personal review of the ekg ordered shows atrial flutter, rate 110  Recent Labs: 10/21/2019: ALT 21; TSH 1.123 11/13/2019: Platelets 257 11/20/2019: Hemoglobin 14.6 04/10/2020: Magnesium 2.4 04/21/2020: BUN 24; Creatinine, Ser 1.52; Potassium 4.5; Sodium 138    Lipid Panel     Component  Value Date/Time   CHOL 108 10/21/2019 0441   TRIG 65 10/21/2019 0441   HDL 36 (L) 10/21/2019  0441   CHOLHDL 3.0 10/21/2019 0441   VLDL 13 10/21/2019 0441   LDLCALC 59 10/21/2019 0441     Wt Readings from Last 3 Encounters:  06/10/20 123 lb (55.8 kg)  04/10/20 123 lb (55.8 kg)  02/07/20 121 lb 6.4 oz (55.1 kg)      Other studies Reviewed: Additional studies/ records that were reviewed today include: TTE 12/29/18  Review of the above records today demonstrates:  - Left ventricle: The cavity size was normal. Systolic function was   normal. The estimated ejection fraction was in the range of 55%   to 60%. Hypokinesis of the basalanteroseptal and inferoseptal   myocardium. The study is not technically sufficient to allow   evaluation of LV diastolic function. - Aortic valve: Valve mobility was restricted. Transvalvular   velocity was within the normal range. There was no stenosis.   There was no regurgitation. Mean gradient (S): 8 mm Hg. Valve   area (VTI): 1.28 cm^2. Valve area (Vmax): 1.28 cm^2. Valve area   (Vmean): 1.26 cm^2. - Mitral valve: Transvalvular velocity was within the normal range.   There was no evidence for stenosis. There was mild regurgitation. - Left atrium: The atrium was moderately dilated. - Right ventricle: The cavity size was normal. Wall thickness was   normal. Systolic function was normal. - Tricuspid valve: There was mild regurgitation. - Pulmonary arteries: Systolic pressure was within the normal   range. PA peak pressure: 35 mm Hg (S).   ASSESSMENT AND PLAN:  1.  Persistent atrial fibrillation/atypical atrial flutter: Currently on Xarelto, dofetilide, diltiazem.  Status post ablation 06/08/2019 with multiple atrial flutter circuits.  CHA2DS2-VASc of 6.  It appears that he is back in atrial flutter.  He has some weakness and fatigue.  Him and his wife say that his heart rate has fluctuated and is unclear to me whether or not he is going in and out of atrial flutter or staying in normal rhythm.  They are going to monitor this and follow-up in A. fib  clinic in a couple weeks.  He may need a cardioversion at that time.  Due to his fatigue, we Romina Divirgilio stop Bystolic to see if this helps.  If it does not, may restart it or start diltiazem if he has fast rates.   2.  Obstructive sleep apnea: Cannot tolerate the facemask.  Has been referred for an oral appliance.  Compliance encouraged.  3.  Coronary artery disease: Status post CABG.  No current chest pain.  4.  Hypertension: Currently well controlled.    Current medicines are reviewed at length with the patient today.   The patient does not have concerns regarding his medicines.  The following changes were made today: Stop Bystolic  Labs/ tests ordered today include:  Orders Placed This Encounter  Procedures   EKG 12-Lead     Disposition:   FU with Elie Gragert 3 months  Signed, Skylah Delauter Meredith Leeds, MD  06/10/2020 11:13 AM     [facility] [address] [place] [address] [phone] (office) [phone] (fax)

## 2020-06-10 NOTE — Patient Instructions (Addendum)
Medication Instructions:  1. STOP Bystolic   *If you need a refill on your cardiac medications before your next appointment, please call your pharmacy*   Lab Work: None ordered  Testing/Procedures: None ordered   Follow-Up: At [facility], you and your health needs are our priority.  As part of our continuing mission to provide you with exceptional heart care, we have created designated Provider Care Teams.  These Care Teams include your primary Cardiologist (physician) and Advanced Practice Providers (APPs -  Physician Assistants and Nurse Practitioners) who all work together to provide you with the care you need, when you need it.  We recommend signing up for the patient portal called "MyChart".  Sign up information is provided on this After Visit Summary.  MyChart is used to connect with patients for Virtual Visits (Telemedicine).  Patients are able to view lab/test results, encounter notes, upcoming appointments, etc.  Non-urgent messages can be sent to your provider as well.   To learn more about what you can do with MyChart, go to [email or web address].    Your next appointment:   2 week(s)  The format for your next appointment:   In Person  Provider:   AFib clinic   Your physician recommends that you schedule a follow-up appointment in: 3 months with Dr. Curt Bears.   Thank you for choosing [facility]!!   Trinidad Curet, RN [phone]

## 2020-06-13 DIAGNOSIS — I480 Paroxysmal atrial fibrillation: Secondary | ICD-10-CM | POA: Diagnosis not present

## 2020-06-13 DIAGNOSIS — N1831 Chronic kidney disease, stage 3a: Secondary | ICD-10-CM | POA: Diagnosis not present

## 2020-06-13 DIAGNOSIS — Z8673 Personal history of transient ischemic attack (TIA), and cerebral infarction without residual deficits: Secondary | ICD-10-CM | POA: Diagnosis not present

## 2020-06-13 DIAGNOSIS — E785 Hyperlipidemia, unspecified: Secondary | ICD-10-CM | POA: Diagnosis not present

## 2020-06-13 DIAGNOSIS — I130 Hypertensive heart and chronic kidney disease with heart failure and stage 1 through stage 4 chronic kidney disease, or unspecified chronic kidney disease: Secondary | ICD-10-CM | POA: Diagnosis not present

## 2020-06-13 DIAGNOSIS — G4700 Insomnia, unspecified: Secondary | ICD-10-CM | POA: Diagnosis not present

## 2020-06-13 DIAGNOSIS — I509 Heart failure, unspecified: Secondary | ICD-10-CM | POA: Diagnosis not present

## 2020-06-13 DIAGNOSIS — G250 Essential tremor: Secondary | ICD-10-CM | POA: Diagnosis not present

## 2020-06-24 ENCOUNTER — Encounter: Payer: Self-pay | Admitting: Gastroenterology

## 2020-06-24 ENCOUNTER — Ambulatory Visit (HOSPITAL_COMMUNITY)
Admission: RE | Admit: 2020-06-24 | Discharge: 2020-06-24 | Disposition: A | Discharge status: Home / Self Care | Payer: Medicare Other | Source: Ambulatory Visit | Attending: Physician Assistant | Admitting: Physician Assistant

## 2020-06-24 ENCOUNTER — Ambulatory Visit (INDEPENDENT_AMBULATORY_CARE_PROVIDER_SITE_OTHER): Payer: Medicare Other | Admitting: Gastroenterology

## 2020-06-24 ENCOUNTER — Other Ambulatory Visit: Payer: Self-pay

## 2020-06-24 VITALS — BP 110/70 | HR 131 | Wt 123.1 lb

## 2020-06-24 VITALS — BP 140/80 | HR 122 | Ht 66.0 in | Wt 122.8 lb

## 2020-06-24 DIAGNOSIS — I255 Ischemic cardiomyopathy: Secondary | ICD-10-CM | POA: Insufficient documentation

## 2020-06-24 DIAGNOSIS — G4733 Obstructive sleep apnea (adult) (pediatric): Secondary | ICD-10-CM | POA: Diagnosis not present

## 2020-06-24 DIAGNOSIS — J449 Chronic obstructive pulmonary disease, unspecified: Secondary | ICD-10-CM | POA: Diagnosis not present

## 2020-06-24 DIAGNOSIS — Z7901 Long term (current) use of anticoagulants: Secondary | ICD-10-CM | POA: Diagnosis not present

## 2020-06-24 DIAGNOSIS — Z8673 Personal history of transient ischemic attack (TIA), and cerebral infarction without residual deficits: Secondary | ICD-10-CM | POA: Diagnosis not present

## 2020-06-24 DIAGNOSIS — I110 Hypertensive heart disease with heart failure: Secondary | ICD-10-CM | POA: Insufficient documentation

## 2020-06-24 DIAGNOSIS — D6869 Other thrombophilia: Secondary | ICD-10-CM | POA: Diagnosis not present

## 2020-06-24 DIAGNOSIS — I252 Old myocardial infarction: Secondary | ICD-10-CM | POA: Diagnosis not present

## 2020-06-24 DIAGNOSIS — I5032 Chronic diastolic (congestive) heart failure: Secondary | ICD-10-CM | POA: Diagnosis not present

## 2020-06-24 DIAGNOSIS — Z86010 Personal history of colonic polyps: Secondary | ICD-10-CM | POA: Insufficient documentation

## 2020-06-24 DIAGNOSIS — R1310 Dysphagia, unspecified: Secondary | ICD-10-CM

## 2020-06-24 DIAGNOSIS — K219 Gastro-esophageal reflux disease without esophagitis: Secondary | ICD-10-CM

## 2020-06-24 DIAGNOSIS — R1032 Left lower quadrant pain: Secondary | ICD-10-CM

## 2020-06-24 DIAGNOSIS — Z87891 Personal history of nicotine dependence: Secondary | ICD-10-CM | POA: Diagnosis not present

## 2020-06-24 DIAGNOSIS — Z9842 Cataract extraction status, left eye: Secondary | ICD-10-CM | POA: Diagnosis not present

## 2020-06-24 DIAGNOSIS — Z8249 Family history of ischemic heart disease and other diseases of the circulatory system: Secondary | ICD-10-CM | POA: Insufficient documentation

## 2020-06-24 DIAGNOSIS — I484 Atypical atrial flutter: Secondary | ICD-10-CM | POA: Diagnosis not present

## 2020-06-24 DIAGNOSIS — Z79899 Other long term (current) drug therapy: Secondary | ICD-10-CM | POA: Diagnosis not present

## 2020-06-24 DIAGNOSIS — I4819 Other persistent atrial fibrillation: Secondary | ICD-10-CM | POA: Diagnosis not present

## 2020-06-24 DIAGNOSIS — I2510 Atherosclerotic heart disease of native coronary artery without angina pectoris: Secondary | ICD-10-CM | POA: Insufficient documentation

## 2020-06-24 DIAGNOSIS — Z9841 Cataract extraction status, right eye: Secondary | ICD-10-CM | POA: Insufficient documentation

## 2020-06-24 DIAGNOSIS — E785 Hyperlipidemia, unspecified: Secondary | ICD-10-CM | POA: Insufficient documentation

## 2020-06-24 MED ORDER — DILTIAZEM HCL ER COATED BEADS 120 MG PO CP24: 120.0000 mg | ORAL_CAPSULE | Freq: Every day | ORAL | 3 refills | Status: DC

## 2020-06-24 NOTE — Progress Notes (Signed)
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 The patient was initially diagnosed with atrial fibrillation several years ago and is s/p afib ablation 06/25/14 at [facility] by Dr. Lehman Prom and has been maintained on Tikosyn since. He has had some possible a fib, atrial flutter vs ectopic atrial rhythm. On 12/07/18, he was noted to be in an ectopic atrial rhythm with 2:1 block and was cardioverted on 12/11/18. On review of his ECGs, it appears he had some transient AV dissociation post cardioversion but is back in SR on follow up. He has been asymptomatic. He denies bleeding issues with anticoagulation. He has OSA and struggles with using his CPAP. He is now on an oral device and follows with Dr Radford Pax and Dr Toy Cookey.  Patient is s/p repeat afib ablation 06/08/19 with Dr Curt Bears. Patient admitted 10/24-10/26/20 with acute CVA after holding anticoagulation for oral surgery. He is on Eliquis for a CHADS2VASC score of 6. Patient is s/p DCCV 11/20/19.   On follow up today, patient was back in atrial flutter at his visit with Dr Curt Bears. His BB was stopped at that point 2/2 side effects of fatigue. Patient reports he feels like he has more energy off the BB however, his heart rate has been elevated around 120-130 at home. Overall, he is not very symptomatic with his arrhythmia.   Today, he denies symptoms of palpitations, SOB, chest pain, orthopnea, PND, lower extremity edema, presyncope, syncope, bleeding, or neurologic sequela. The patient is tolerating medications without difficulties and is otherwise without complaint today.    Atrial Fibrillation Risk Factors:  he does have symptoms or diagnosis of  sleep apnea. he is compliant with OSA therapy. Now on oral appliance.    he has a BMI of Body mass index is 19.82 kg/m.[name] Filed Weights   06/24/20 1103  Weight: 55.7 kg     Atrial Fibrillation Management history:  Previous antiarrhythmic drugs: Tikosyn (avoid amio given abn PFTs) Previous cardioversions: 12/11/18, 11/20/19 Previous ablations: 2015 at [facility], 06/08/19 CHADS2VASC score: 6 (age, HTN, CAD, CVA) Anticoagulation history: Xarelto, Eliquis   Past Medical History:  Diagnosis Date  . Abnormal nuclear cardiac imaging test March 2011   Has positive EKG response, no perfusion defect and normal EF  . Adenomatous colon polyp 12/2002  . Arrhythmia   . Atrial fibrillation ([place])    a. s/p rfca;  b. chronic tikosyn and xarelto.  . 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 Cataract    bil cataracts removed  . Chronic diastolic CHF (congestive heart failure) ([place])    a. 04/2017 Echo: EF 65-70%, restrictive physiology, mildly dil LA.  [name] Complication of anesthesia   . COPD (chronic obstructive pulmonary disease) ([place])   . Coronary artery disease    First MI in 1988. PCI in 1996 with subsequent CABG in 1996 due to restenosis. S/P stents to LCX in 2009. Noted to have residual disease in the LAD in a diffuse manner and atretic LIMA graft. He is managed medically.   . Difficult intubation    06/08/19 update - Intubated with MAC3 with grade IIb view 7.0 tube passed easily  . Dysrhythmia    Afib  . Esophageal  stricture   . GERD (gastroesophageal reflux disease)   . 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 History of Raynaud's syndrome   . Hyperlipidemia   . Hypertension   . Internal hemorrhoids   . Muscle spasm   . Myocardial infarction [facility])    MI x1 99 - 10 age  . Neuromuscular disorder ([place])    Polio - age 4  . OSA (obstructive sleep apnea) 10/16/2018   Severe OSA with AHI 37.2/hr on BiPAP  . Persistent atrial  fibrillation ([place])   . Polio    age 65  . PVC (premature ventricular contraction)   . Scoliosis    mild   Past Surgical History:  Procedure Laterality Date  . ABLATION     done for a fib  . APPENDECTOMY     Age 42  . ATRIAL FIBRILLATION ABLATION N/A 06/08/2019   Procedure: ATRIAL FIBRILLATION ABLATION;  Surgeon: Constance Haw, MD;  Location: [place] [facility];  Service: Cardiovascular;  Laterality: N/A;  . CARDIAC CATHETERIZATION    . CARDIOVERSION N/A 08/22/2013   Procedure: CARDIOVERSION;  Surgeon: Carlena Bjornstad, MD;  Location: [facility] ENDOSCOPY;  Service: Cardiovascular;  Laterality: N/A;  . CARDIOVERSION N/A 01/16/2014   Procedure: CARDIOVERSION;  Surgeon: Lelon Perla, MD;  Location: [facility] ENDOSCOPY;  Service: Cardiovascular;  Laterality: N/A;  . CARDIOVERSION N/A 12/11/2018   Procedure: CARDIOVERSION;  Surgeon: Larey Dresser, MD;  Location: [facility] ENDOSCOPY;  Service: Cardiovascular;  Laterality: N/A;  . CARDIOVERSION N/A 11/20/2019   Procedure: CARDIOVERSION;  Surgeon: [name], MD;  Location: [facility] ENDOSCOPY;  Service: Endoscopy;  Laterality: N/A;  . COLONOSCOPY  2008  . CORONARY ARTERY BYPASS GRAFT  1996   with a LIMA to the LAD, SVG to RCA and SVG to OM  . CORONARY STENT INTERVENTION N/A 03/26/2019   Procedure: CORONARY STENT INTERVENTION;  Surgeon: Jettie Booze, MD;  Location: [place] [facility];  Service: Cardiovascular;  Laterality: N/A;  . CORONARY STENT PLACEMENT  1996   LCX  . EMBOLECTOMY Right 05/09/2019   Procedure: Embolectomy of right radial artery;  Surgeon: Serafina Mitchell, MD;  Location: [place];  Service: Vascular;  Laterality: Right;  . ESOPHAGEAL DILATION    . EYE SURGERY    . FALSE ANEURYSM REPAIR Right 05/09/2019   Procedure: REPAIR FALSE ANEURYSM RIGHT RADIAL;  Surgeon: Serafina Mitchell, MD;  Location: [place];  Service: Vascular;  Laterality: Right;  . LEFT HEART CATH AND CORS/GRAFTS ANGIOGRAPHY N/A 03/26/2019   Procedure: LEFT HEART  CATH AND CORS/GRAFTS ANGIOGRAPHY;  Surgeon: Larey Dresser, MD;  Location: [place] [facility];  Service: Cardiovascular;  Laterality: N/A;  . POLYPECTOMY    . TONSILLECTOMY    . UPPER GASTROINTESTINAL ENDOSCOPY     esophegeal dilation    Current Outpatient Medications  Medication Sig Dispense Refill  . acetaminophen (TYLENOL) 500 MG tablet Take 500 mg by mouth every 8 (eight) hours as needed for mild pain or moderate pain.     [name] albuterol (PROAIR HFA) 108 (90 BASE) MCG/ACT inhaler Inhale 2 puffs into the lungs every 4 (four) hours as needed for wheezing or shortness of breath (and prior to exercise). 1 Inhaler 3  . AMBULATORY NON FORMULARY MEDICATION Medication Name: Testosterone (AT) 150/0(150mg ) cpd    . dicyclomine (BENTYL) 10 MG capsule Take 10 mg by mouth 3 (three) times daily as needed for spasms.    [name] dofetilide (TIKOSYN) 250 MCG capsule Take  250 mcg by mouth 2 (two) times daily.    [name] ELIQUIS 2.5 MG TABS tablet TAKE 1 TABLET BY MOUTH TWICE A DAY 180 tablet 1  . ezetimibe (ZETIA) 10 MG tablet TAKE 1 TABLET BY MOUTH EVERY DAY 90 tablet 3  . famotidine (PEPCID) 40 MG tablet Take 1 tablet (40 mg total) by mouth at bedtime. 90 tablet 3  . furosemide (LASIX) 20 MG tablet Take 40mg  daily alternating with 20mg  every other day 45 tablet 5  . KLOR-CON M20 20 MEQ tablet TAKE 1 TABLET BY MOUTH AT BEDTIME 90 tablet 3  . lactobacillus acidophilus & bulgar (LACTINEX) chewable tablet Chew 2 tablets by mouth daily.     [name] loperamide (IMODIUM) 2 MG capsule Take 2-4 mg by mouth as needed for diarrhea or loose stools.     [name] LORazepam (ATIVAN) 0.5 MG tablet Take 0.5 mg by mouth at bedtime.     . magnesium oxide (MAG-OX) 400 MG tablet Take 400 mg by mouth every evening.     . methocarbamol (ROBAXIN) 500 MG tablet Take 500 mg by mouth 3 (three) times daily as needed for muscle spasms.     . nitroGLYCERIN (NITROSTAT) 0.4 MG SL tablet Place 1 tablet (0.4 mg total) under the tongue every 5 (five) minutes as  needed for chest pain. 25 tablet 0  . Probiotic Product (ALIGN) 4 MG CAPS Take 4 mg by mouth every evening.     . rosuvastatin (CRESTOR) 40 MG tablet TAKE 1 TABLET BY MOUTH EVERY DAY 90 tablet 3  . traZODone (DESYREL) 50 MG tablet Take 50 mg by mouth at bedtime.    . Vitamin D, Ergocalciferol, (DRISDOL) 50000 UNITS CAPS capsule Take 50,000 Units by mouth every 14 (fourteen) days. Take on 1st and 15th    . diltiazem (CARDIZEM CD) 120 MG 24 hr capsule Take 1 capsule (120 mg total) by mouth daily. 30 capsule 3   No current facility-administered medications for this encounter.    Allergies  Allergen Reactions  . Morphine And Related Other (See Comments)    IV forms- vein irritation  . Tape Other (See Comments)    Skin Irritation    Social History   Socioeconomic History  . Marital status: Married    Spouse name: Inez Catalina  . Number of children: 2  . Years of education: Not on file  . Highest education level: Not on file  Occupational History  . Occupation: Pharmacist  Tobacco Use  . Smoking status: Former Smoker    Packs/day: 0.50    Years: 5.00    Pack years: 2.50    Types: Cigarettes    Quit date: 12/27/1966    Years since quitting: 53.5  . Smokeless tobacco: Former Systems developer    Quit date: Art therapist  . Vaping Use: Never used  Substance and Sexual Activity  . Alcohol use: No  . Drug use: No  . Sexual activity: Yes  Other Topics Concern  . 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 Difficulty of Paying Living Expenses: Not hard at all  Food Insecurity: No Food Insecurity  . Worried About Charity fundraiser in the Last Year: Never true  . Ran Out of Food in the Last Year: Never true  Transportation Needs: No Transportation Needs  .  Lack of Transportation (Medical): No  . Lack of Transportation (Non-Medical): No    Physical Activity: Inactive  . Days of Exercise per Week: 0 days  . Minutes of Exercise per Session: 0 min  Stress: Stress Concern Present  . Feeling of Stress : To some extent  Social Connections:   . Frequency of Communication with Friends and Family:   . Frequency of Social Gatherings with Friends and Family:   . Attends Religious Services:   . Active Member of Clubs or Organizations:   . Attends Archivist Meetings:   [name] Marital Status:   Intimate Partner Violence:   . Fear of Current or Ex-Partner:   . Emotionally Abused:   [name] Physically Abused:   . Sexually Abused:     Family History  Problem Relation Age of Onset  . Heart disease Mother   . Heart disease Father   . Heart Problems Brother        heart transplant  . Heart disease Maternal Grandmother   . Heart disease Maternal Grandfather   . Heart disease Paternal Grandmother   . Heart disease Paternal Grandfather   . Prostate cancer Paternal Uncle   . Cancer Son        thymic cancer.  died at age 55  . Colon cancer Neg Hx   . Esophageal cancer Neg Hx   . Pancreatic cancer Neg Hx   . Rectal cancer Neg Hx   . Stomach cancer Neg Hx     ROS- All systems are reviewed and negative except as per the HPI above.  Physical Exam: Vitals:   06/24/20 1103  BP: 140/80  Pulse: (!) 122  Weight: 55.7 kg  Height: 5\' 6"  (1.676 m)    GEN- The patient is well appearing elderly male, alert and oriented x 3 today.   HEENT-head normocephalic, atraumatic, sclera clear, conjunctiva pink, hearing intact, trachea midline. Lungs- Clear to ausculation bilaterally, normal work of breathing Heart- Regular rate and rhythm, tachycardia, no murmurs, rubs or gallops  GI- soft, NT, ND, + BS Extremities- no clubbing, cyanosis, or edema MS- no significant deformity or atrophy Skin- no rash or lesion Psych- euthymic mood, full affect Neuro- strength and sensation are intact   Wt Readings from Last 3 Encounters:  06/24/20 55.7  kg  06/24/20 55.8 kg  06/10/20 55.8 kg    EKG today demonstrates atypical atrial flutter HR 122, QRS 102, QTc 493  Echo 03/26/19 demonstrated   1. The left ventricle has mildly reduced systolic function, with an ejection fraction of 45-50%. The cavity size was mildly dilated. Left ventricular diastolic Doppler parameters are indeterminate.  2. Posterior lateral and inferior basal hypokinesis.  3. The right ventricle has normal systolic function. The cavity was normal. There is no increase in right ventricular wall thickness.  4. Left atrial size was mildly dilated.  5. Right atrial size was mildly dilated.  6. The mitral valve is degenerative. Moderate thickening of the mitral valve leaflet. Mild calcification of the mitral valve leaflet. There is mild mitral annular calcification present.  7. Tricuspid valve regurgitation is mild-moderate.  8. The aortic valve is tricuspid. Moderate thickening of the aortic valve. Moderate sclerosis of the aortic valve.  Epic records reviewed.   Assessment and Plan:  1. Persistent atrial fibrillation/atypical atrial flutter S/p repeat afib ablation with Dr Curt Bears 06/08/19. Per procedure report, flutter unable to be ablated due to complexity of atrial flutter. Patient in rapid atrial flutter today.  We discussed therapeutic  options today including resuming rate control +/- DCCV.  Will plan to start diltiazem 120 mg daily. Patient would like to see how he feels on diltiazem before considering DCCV.  Continue Eliquis 2.5 mg BID. Patient denies any missed doses in the last 3 weeks.  Continue dofetilide 250 mcg BID.  This patients CHA2DS2-VASc Score and unadjusted Ischemic Stroke Rate (% per year) is equal to 9.7 % stroke rate/year from a score of 6  Above score calculated as 1 point each if present [CHF, HTN, DM, Vascular=MI/PAD/Aortic Plaque, Age if 65-74, or Male] Above score calculated as 2 points each if present [Age > 75, or Stroke/TIA/TE]  2.  Obstructive sleep apnea Unable to tolerate facemask. Patient using oral device.   3. CAD/Ischemic CM No anginal symptoms. No signs or symptoms of fluid overload today.  4. HTN Stable, no changes today.   Follow up in the AF clinic in one week.    [facility] [address] [place], [place] [address] [phone]

## 2020-06-24 NOTE — Patient Instructions (Signed)
Call our office back if you have any recurrent symptoms of dysphagia (trouble swallowing).   We will contact you in January 2022 to schedule a repeat MRI of your pancreas.   Thank you for choosing me and [facility].  Pricilla Riffle. Dagoberto Ligas., MD., Marval Regal

## 2020-06-24 NOTE — Progress Notes (Signed)
    History of Present Illness: This is an 84 year old male with a history of GERD, esophageal stricture, chronic diarrhea of unclear etiology who complains of a slight increase in intermittent dysphagia and intermittent left lower quadrant pain.  He describes his left lower quadrant pain as a gas pain.  It is relieved with dicyclomine.  It occurs every several days and does not appear to be related to meals or bowel movements.  He notes a slight increase in solid food dysphagia and dysphagia with large pills.  His reflux symptoms are well controlled on famotidine.  10/2017 - The examined portion of the ileum was normal. - One 7 mm polyp in the sigmoid colon, removed with a cold snare. Resected and retrieved. - Internal hemorrhoids. - The examination was otherwise normal on direct and retroflexion views. Biopsies obtained.  EGD 10/2017 - Benign-appearing esophageal stenosis. Dilated. - Normal stomach. - Normal duodenal bulb and second portion of the duodenum. Biopsied. - No specimens collected.  Current Medications, Allergies, Past Medical History, Past Surgical History, Family History and Social History were reviewed in [employer] record.   Physical Exam: General: Well developed, well nourished, no acute distress Head: Normocephalic and atraumatic Eyes:  sclerae anicteric, EOMI Ears: Normal auditory acuity Mouth: Not examined, mask on during Covid-19 pandemic Lungs: Clear throughout to auscultation Heart: Regular rate and rhythm; no murmurs, rubs or bruits Abdomen: Soft, non tender and non distended. No masses, hepatosplenomegaly or hernias noted. Normal Bowel sounds Rectal: Not done Musculoskeletal: Symmetrical with no gross deformities  Pulses:  Normal pulses noted Extremities: No clubbing, cyanosis, edema or deformities noted Neurological: Alert oriented x 4, grossly nonfocal Psychological:  Alert and cooperative. Normal mood and affect   Assessment and  Recommendations:  1. GERD with history of esophageal stricture.  Solid food and pill dysphagia slightly increased in frequency.  Recommended avoiding large pills.  Recommended proceeding with EGD with possible dilation however he would like to hold off for now and see if symptoms worsen prior to proceeding.  Follow antireflux measures.  Continue famotidine 40 mg daily. REV in 1 year.  2. Chronic diarrhea, etiology unclear.  This problem is stable and unchanged.  Trial of holding MgOx 400 mg qd.  Continue Imodium.  3. 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  Follow-up MRI/MRCP is planned for 12/2020.   4. LLQ pain. Dicyclomine 10 mg po tid prn.  If symptoms were none in frequency or severity or to stop responding to dicyclomine he is advised to call.  5.  Personal history of adenomatous colon polyps.  Last colonoscopy in November 2018 and given age and only one small polyp found no future surveillance colonoscopies are recommended.  He can also discontinue routine fecal occult blood testing.

## 2020-06-24 NOTE — Patient Instructions (Signed)
Start cardizem 120mg once a day 

## 2020-07-01 ENCOUNTER — Encounter (HOSPITAL_COMMUNITY): Payer: Self-pay | Admitting: Physician Assistant

## 2020-07-01 ENCOUNTER — Ambulatory Visit (HOSPITAL_COMMUNITY)
Admission: RE | Admit: 2020-07-01 | Discharge: 2020-07-01 | Disposition: A | Discharge status: Home / Self Care | Payer: Medicare Other | Source: Ambulatory Visit | Attending: Physician Assistant | Admitting: Physician Assistant

## 2020-07-01 ENCOUNTER — Other Ambulatory Visit: Payer: Self-pay

## 2020-07-01 VITALS — BP 150/64 | HR 126 | Ht 66.0 in | Wt 127.2 lb

## 2020-07-01 DIAGNOSIS — E785 Hyperlipidemia, unspecified: Secondary | ICD-10-CM | POA: Insufficient documentation

## 2020-07-01 DIAGNOSIS — I255 Ischemic cardiomyopathy: Secondary | ICD-10-CM | POA: Insufficient documentation

## 2020-07-01 DIAGNOSIS — I110 Hypertensive heart disease with heart failure: Secondary | ICD-10-CM | POA: Insufficient documentation

## 2020-07-01 DIAGNOSIS — I5032 Chronic diastolic (congestive) heart failure: Secondary | ICD-10-CM | POA: Insufficient documentation

## 2020-07-01 DIAGNOSIS — Z7901 Long term (current) use of anticoagulants: Secondary | ICD-10-CM | POA: Diagnosis not present

## 2020-07-01 DIAGNOSIS — I484 Atypical atrial flutter: Secondary | ICD-10-CM | POA: Insufficient documentation

## 2020-07-01 DIAGNOSIS — J449 Chronic obstructive pulmonary disease, unspecified: Secondary | ICD-10-CM | POA: Insufficient documentation

## 2020-07-01 DIAGNOSIS — I2510 Atherosclerotic heart disease of native coronary artery without angina pectoris: Secondary | ICD-10-CM | POA: Insufficient documentation

## 2020-07-01 DIAGNOSIS — I252 Old myocardial infarction: Secondary | ICD-10-CM | POA: Insufficient documentation

## 2020-07-01 DIAGNOSIS — Z87891 Personal history of nicotine dependence: Secondary | ICD-10-CM | POA: Diagnosis not present

## 2020-07-01 DIAGNOSIS — Z8249 Family history of ischemic heart disease and other diseases of the circulatory system: Secondary | ICD-10-CM | POA: Insufficient documentation

## 2020-07-01 DIAGNOSIS — G4733 Obstructive sleep apnea (adult) (pediatric): Secondary | ICD-10-CM | POA: Diagnosis not present

## 2020-07-01 DIAGNOSIS — Z8673 Personal history of transient ischemic attack (TIA), and cerebral infarction without residual deficits: Secondary | ICD-10-CM | POA: Diagnosis not present

## 2020-07-01 DIAGNOSIS — K219 Gastro-esophageal reflux disease without esophagitis: Secondary | ICD-10-CM | POA: Insufficient documentation

## 2020-07-01 DIAGNOSIS — Z79899 Other long term (current) drug therapy: Secondary | ICD-10-CM | POA: Diagnosis not present

## 2020-07-01 DIAGNOSIS — D6869 Other thrombophilia: Secondary | ICD-10-CM | POA: Diagnosis not present

## 2020-07-01 DIAGNOSIS — I480 Paroxysmal atrial fibrillation: Secondary | ICD-10-CM | POA: Diagnosis present

## 2020-07-01 LAB — BASIC METABOLIC PANEL
Anion gap: 10 (ref 5–15)
BUN: 20 mg/dL (ref 8–23)
CO2: 24 mmol/L (ref 22–32)
Calcium: 9.8 mg/dL (ref 8.9–10.3)
Chloride: 102 mmol/L (ref 98–111)
Creatinine, Ser: 1.56 mg/dL — ABNORMAL HIGH (ref 0.61–1.24)
GFR calc Af Amer: 47 mL/min — ABNORMAL LOW (ref >60)
GFR calc non Af Amer: 41 mL/min — ABNORMAL LOW (ref >60)
Glucose, Bld: 127 mg/dL — ABNORMAL HIGH (ref 70–99)
Potassium: 4.5 mmol/L (ref 3.5–5.1)
Sodium: 136 mmol/L (ref 135–145)

## 2020-07-01 LAB — CBC
HCT: 39.3 % (ref 39.0–52.0)
Hemoglobin: 11.7 g/dL — ABNORMAL LOW (ref 13.0–17.0)
MCH: 28.5 pg (ref 26.0–34.0)
MCHC: 29.8 g/dL — ABNORMAL LOW (ref 30.0–36.0)
MCV: 95.6 fL (ref 80.0–100.0)
Platelets: 178 10*3/uL (ref 150–400)
RBC: 4.11 MIL/uL — ABNORMAL LOW (ref 4.22–5.81)
RDW: 13.8 % (ref 11.5–15.5)
WBC: 5.2 10*3/uL (ref 4.0–10.5)
nRBC: 0 % (ref 0.0–0.2)

## 2020-07-01 MED ORDER — DILTIAZEM HCL ER COATED BEADS 120 MG PO CP24: 120.0000 mg | ORAL_CAPSULE | Freq: Every day | ORAL | 3 refills | Status: DC

## 2020-07-01 NOTE — Patient Instructions (Addendum)
Cardioversion scheduled for Thursday, July 15th   - Arrive at the [facility] and go to admitting at [facility] not eat or drink anything after midnight the night prior to your procedure.  - Take all your morning medication (except diabetic medications) with a sip of water prior to arrival.  - You will not be able to drive home after your procedure.  - Do NOT miss any doses of your blood thinner - if you should miss a dose please notify our office immediately.  Increase cardizem to 120mg  twice a day until cardioversion then reduce back to 120mg  once a day

## 2020-07-01 NOTE — H&P (View-Only) (Signed)
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 The patient was initially diagnosed with atrial fibrillation several years ago and is s/p afib ablation 06/25/14 at [facility] by Dr. Lehman Prom and has been maintained on Tikosyn since. He has had some possible a fib, atrial flutter vs ectopic atrial rhythm. On 12/07/18, he was noted to be in an ectopic atrial rhythm with 2:1 block and was cardioverted on 12/11/18. On review of his ECGs, it appears he had some transient AV dissociation post cardioversion but is back in SR on follow up. He has been asymptomatic. He denies bleeding issues with anticoagulation. He has OSA and struggles with using his CPAP. He is now on an oral device and follows with Dr Radford Pax and Dr Toy Cookey.  Patient is s/p repeat afib ablation 06/08/19 with Dr Curt Bears. Patient admitted 10/24-10/26/20 with acute CVA after holding anticoagulation for oral surgery. He is on Eliquis for a CHADS2VASC score of 6. Patient is s/p DCCV 11/20/19. Patient was back in atrial flutter at his visit with Dr Curt Bears on 06/10/20. His BB was stopped at that point 2/2 side effects of fatigue. Patient reports he feels like he has more energy off the BB however, his heart rate had been elevated around 120-130 at home.   On follow up today, patient reports that he is having more symptoms of palpitations and heart pounding. His heart rate at home is marginally improved, 110-120 bpm. He denies any missed doses of anticoagulation.   Today, he denies symptoms of SOB, chest pain, orthopnea, PND, lower extremity edema, presyncope, syncope, bleeding, or neurologic sequela. The patient is tolerating medications without  difficulties and is otherwise without complaint today.    Atrial Fibrillation Risk Factors:  he does have symptoms or diagnosis of sleep apnea. he is compliant with OSA therapy. Now on oral appliance.    he has a BMI of Body mass index is 20.53 kg/m.[name] Filed Weights   07/01/20 0856  Weight: 57.7 kg     Atrial Fibrillation Management history:  Previous antiarrhythmic drugs: Tikosyn (avoid amio given abn PFTs) Previous cardioversions: 12/11/18, 11/20/19 Previous ablations: 2015 at [facility], 06/08/19 CHADS2VASC score: 6 (age, HTN, CAD, CVA) Anticoagulation history: Xarelto, Eliquis   Past Medical History:  Diagnosis Date  . Abnormal nuclear cardiac imaging test March 2011   Has positive EKG response, no perfusion defect and normal EF  . Adenomatous colon polyp 12/2002  . Arrhythmia   . Atrial fibrillation ([place])    a. s/p rfca;  b. chronic tikosyn and xarelto.  . 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 Cataract    bil cataracts removed  . Chronic diastolic CHF (congestive heart failure) ([place])    a. 04/2017 Echo: EF 65-70%, restrictive physiology, mildly dil LA.  [name] Complication of anesthesia   . COPD (chronic obstructive pulmonary disease) ([place])   . Coronary artery disease    First MI in 1988. PCI in 1996 with subsequent CABG in 1996 due to restenosis. S/P stents to LCX in 2009. Noted to have residual disease in the LAD in a diffuse manner and atretic LIMA graft. He is managed medically.   . Difficult intubation    06/08/19 update -  Intubated with MAC3 with grade IIb view 7.0 tube passed easily  . Dysrhythmia    Afib  . Esophageal stricture   . GERD (gastroesophageal reflux disease)   . 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 History of Raynaud's syndrome   . Hyperlipidemia   . Hypertension   . Internal hemorrhoids   . Muscle spasm   . Myocardial infarction [facility])    MI x1 68 - 55 age  . Neuromuscular disorder  ([place])    Polio - age 60  . OSA (obstructive sleep apnea) 10/16/2018   Severe OSA with AHI 37.2/hr on BiPAP  . Persistent atrial fibrillation ([place])   . Polio    age 3  . PVC (premature ventricular contraction)   . Scoliosis    mild   Past Surgical History:  Procedure Laterality Date  . ABLATION     done for a fib  . APPENDECTOMY     Age 65  . ATRIAL FIBRILLATION ABLATION N/A 06/08/2019   Procedure: ATRIAL FIBRILLATION ABLATION;  Surgeon: Constance Haw, MD;  Location: [place] [facility];  Service: Cardiovascular;  Laterality: N/A;  . CARDIAC CATHETERIZATION    . CARDIOVERSION N/A 08/22/2013   Procedure: CARDIOVERSION;  Surgeon: Carlena Bjornstad, MD;  Location: [facility] ENDOSCOPY;  Service: Cardiovascular;  Laterality: N/A;  . CARDIOVERSION N/A 01/16/2014   Procedure: CARDIOVERSION;  Surgeon: Lelon Perla, MD;  Location: [facility] ENDOSCOPY;  Service: Cardiovascular;  Laterality: N/A;  . CARDIOVERSION N/A 12/11/2018   Procedure: CARDIOVERSION;  Surgeon: Larey Dresser, MD;  Location: [facility] ENDOSCOPY;  Service: Cardiovascular;  Laterality: N/A;  . CARDIOVERSION N/A 11/20/2019   Procedure: CARDIOVERSION;  Surgeon: [name], MD;  Location: [facility] ENDOSCOPY;  Service: Endoscopy;  Laterality: N/A;  . COLONOSCOPY  2008  . CORONARY ARTERY BYPASS GRAFT  1996   with a LIMA to the LAD, SVG to RCA and SVG to OM  . CORONARY STENT INTERVENTION N/A 03/26/2019   Procedure: CORONARY STENT INTERVENTION;  Surgeon: Jettie Booze, MD;  Location: [place] [facility];  Service: Cardiovascular;  Laterality: N/A;  . CORONARY STENT PLACEMENT  1996   LCX  . EMBOLECTOMY Right 05/09/2019   Procedure: Embolectomy of right radial artery;  Surgeon: Serafina Mitchell, MD;  Location: [place];  Service: Vascular;  Laterality: Right;  . ESOPHAGEAL DILATION    . EYE SURGERY    . FALSE ANEURYSM REPAIR Right 05/09/2019   Procedure: REPAIR FALSE ANEURYSM RIGHT RADIAL;  Surgeon: Serafina Mitchell, MD;  Location: [place];  Service: Vascular;  Laterality: Right;  . LEFT HEART CATH AND CORS/GRAFTS ANGIOGRAPHY N/A 03/26/2019   Procedure: LEFT HEART CATH AND CORS/GRAFTS ANGIOGRAPHY;  Surgeon: Larey Dresser, MD;  Location: [place] [facility];  Service: Cardiovascular;  Laterality: N/A;  . POLYPECTOMY    . TONSILLECTOMY    . UPPER GASTROINTESTINAL ENDOSCOPY     esophegeal dilation    Current Outpatient Medications  Medication Sig Dispense Refill  . acetaminophen (TYLENOL) 500 MG tablet Take 500 mg by mouth every 8 (eight) hours as needed for mild pain or moderate pain.     [name] albuterol (PROAIR HFA) 108 (90 BASE) MCG/ACT inhaler Inhale 2 puffs into the lungs every 4 (four) hours as needed for wheezing or shortness of breath (and prior to exercise). 1 Inhaler 3  . AMBULATORY NON FORMULARY MEDICATION Medication Name: Testosterone (AT) 150/0(150mg ) cpd    . dicyclomine (BENTYL) 10 MG capsule Take 10  mg by mouth 3 (three) times daily as needed for spasms.    [name] diltiazem (CARDIZEM CD) 120 MG 24 hr capsule Take 1 capsule (120 mg total) by mouth daily. 30 capsule 3  . dofetilide (TIKOSYN) 250 MCG capsule Take 250 mcg by mouth 2 (two) times daily.    [name] ELIQUIS 2.5 MG TABS tablet TAKE 1 TABLET BY MOUTH TWICE A DAY 180 tablet 1  . ezetimibe (ZETIA) 10 MG tablet TAKE 1 TABLET BY MOUTH EVERY DAY 90 tablet 3  . famotidine (PEPCID) 40 MG tablet Take 1 tablet (40 mg total) by mouth at bedtime. 90 tablet 3  . furosemide (LASIX) 20 MG tablet Take 40mg  daily alternating with 20mg  every other day 45 tablet 5  . KLOR-CON M20 20 MEQ tablet TAKE 1 TABLET BY MOUTH AT BEDTIME 90 tablet 3  . lactobacillus acidophilus & bulgar (LACTINEX) chewable tablet Chew 2 tablets by mouth daily.     [name] loperamide (IMODIUM) 2 MG capsule Take 2-4 mg by mouth as needed for diarrhea or loose stools.     [name] LORazepam (ATIVAN) 0.5 MG tablet Take 0.5 mg by mouth at bedtime.     . magnesium oxide (MAG-OX) 400 MG tablet Take 400 mg by mouth every evening.       . methocarbamol (ROBAXIN) 500 MG tablet Take 500 mg by mouth 3 (three) times daily as needed for muscle spasms.     . Probiotic Product (ALIGN) 4 MG CAPS Take 4 mg by mouth every evening.     . rosuvastatin (CRESTOR) 40 MG tablet TAKE 1 TABLET BY MOUTH EVERY DAY 90 tablet 3  . traZODone (DESYREL) 50 MG tablet Take 50 mg by mouth at bedtime.    . Vitamin D, Ergocalciferol, (DRISDOL) 50000 UNITS CAPS capsule Take 50,000 Units by mouth every 14 (fourteen) days. Take on 1st and 15th    . nitroGLYCERIN (NITROSTAT) 0.4 MG SL tablet Place 1 tablet (0.4 mg total) under the tongue every 5 (five) minutes as needed for chest pain. 25 tablet 0   No current facility-administered medications for this encounter.    Allergies  Allergen Reactions  . Morphine And Related Other (See Comments)    IV forms- vein irritation  . Tape Other (See Comments)    Skin Irritation    Social History   Socioeconomic History  . Marital status: Married    Spouse name: Inez Catalina  . Number of children: 2  . Years of education: Not on file  . Highest education level: Not on file  Occupational History  . Occupation: Pharmacist  Tobacco Use  . Smoking status: Former Smoker    Packs/day: 0.50    Years: 5.00    Pack years: 2.50    Types: Cigarettes    Quit date: 12/27/1966    Years since quitting: 53.5  . Smokeless tobacco: Former Systems developer    Quit date: Art therapist  . Vaping Use: Never used  Substance and Sexual Activity  . Alcohol use: No  . Drug use: No  . Sexual activity: Yes  Other Topics Concern  . 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 Difficulty of Paying Living Expenses: Not hard at all  Food Insecurity: No Food Insecurity  . Worried About Charity fundraiser in the Last  Year: Never true  . Ran Out of Food in the Last Year: Never true   Transportation Needs: No Transportation Needs  . Lack of Transportation (Medical): No  . Lack of Transportation (Non-Medical): No  Physical Activity: Inactive  . Days of Exercise per Week: 0 days  . Minutes of Exercise per Session: 0 min  Stress: Stress Concern Present  . Feeling of Stress : To some extent  Social Connections:   . Frequency of Communication with Friends and Family:   . Frequency of Social Gatherings with Friends and Family:   . Attends Religious Services:   . Active Member of Clubs or Organizations:   . Attends Archivist Meetings:   [name] Marital Status:   Intimate Partner Violence:   . Fear of Current or Ex-Partner:   . Emotionally Abused:   [name] Physically Abused:   . Sexually Abused:     Family History  Problem Relation Age of Onset  . Heart disease Mother   . Heart disease Father   . Heart Problems Brother        heart transplant  . Heart disease Maternal Grandmother   . Heart disease Maternal Grandfather   . Heart disease Paternal Grandmother   . Heart disease Paternal Grandfather   . Prostate cancer Paternal Uncle   . Cancer Son        thymic cancer.  died at age 59  . Colon cancer Neg Hx   . Esophageal cancer Neg Hx   . Pancreatic cancer Neg Hx   . Rectal cancer Neg Hx   . Stomach cancer Neg Hx     ROS- All systems are reviewed and negative except as per the HPI above.  Physical Exam: Vitals:   07/01/20 0856  BP: (!) 150/64  Pulse: (!) 126  Weight: 57.7 kg  Height: 5\' 6"  (1.676 m)    GEN- The patient is well appearing elderly male, alert and oriented x 3 today.   HEENT-head normocephalic, atraumatic, sclera clear, conjunctiva pink, hearing intact, trachea midline. Lungs- Clear to ausculation bilaterally, normal work of breathing Heart- irregular rate and rhythm, tachycardia, no murmurs, rubs or gallops  GI- soft, NT, ND, + BS Extremities- no clubbing, cyanosis, or edema MS- no significant deformity or atrophy Skin- no rash or  lesion Psych- euthymic mood, full affect Neuro- strength and sensation are intact   Wt Readings from Last 3 Encounters:  07/01/20 57.7 kg  06/24/20 55.7 kg  06/24/20 55.8 kg    EKG today demonstrates atypical atrial flutter HR126, QRS 100, QTc 509  Echo 03/26/19 demonstrated   1. The left ventricle has mildly reduced systolic function, with an ejection fraction of 45-50%. The cavity size was mildly dilated. Left ventricular diastolic Doppler parameters are indeterminate.  2. Posterior lateral and inferior basal hypokinesis.  3. The right ventricle has normal systolic function. The cavity was normal. There is no increase in right ventricular wall thickness.  4. Left atrial size was mildly dilated.  5. Right atrial size was mildly dilated.  6. The mitral valve is degenerative. Moderate thickening of the mitral valve leaflet. Mild calcification of the mitral valve leaflet. There is mild mitral annular calcification present.  7. Tricuspid valve regurgitation is mild-moderate.  8. The aortic valve is tricuspid. Moderate thickening of the aortic valve. Moderate sclerosis of the aortic valve.  Epic records reviewed.   Assessment and Plan:  1. Persistent atrial fibrillation/atypical atrial flutter S/p repeat afib ablation with Dr Curt Bears 06/08/19. Per procedure  report, flutter unable to be ablated due to complexity of atrial flutter. Patient remains in rapid atrial flutter. Will increase diltiazem to 240 mg daily. Decrease back to 120 mg daily after DCCV. Will arrange for DCCV. Check bmet/CBC Continue Eliquis 2.5 mg BID. Patient denies missed doses in the last 3 weeks.  Continue dofetilide 250 mcg BID.  This patients CHA2DS2-VASc Score and unadjusted Ischemic Stroke Rate (% per year) is equal to 9.7 % stroke rate/year from a score of 6  Above score calculated as 1 point each if present [CHF, HTN, DM, Vascular=MI/PAD/Aortic Plaque, Age if 65-74, or Male] Above score calculated as 2  points each if present [Age > 75, or Stroke/TIA/TE]  2. Obstructive sleep apnea Unable to tolerate facemask.  Patient using oral device.   3. CAD/Ischemic CM No anginal symptoms.   4. HTN Stable, med changes as above.    Follow up with Dr Aundra Dubin and Dr Curt Bears as scheduled.    [facility] [address] [place], [place] [address] [phone]

## 2020-07-01 NOTE — Progress Notes (Addendum)
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 The patient was initially diagnosed with atrial fibrillation several years ago and is s/p afib ablation 06/25/14 at [facility] by Dr. Lehman Prom and has been maintained on Tikosyn since. He has had some possible a fib, atrial flutter vs ectopic atrial rhythm. On 12/07/18, he was noted to be in an ectopic atrial rhythm with 2:1 block and was cardioverted on 12/11/18. On review of his ECGs, it appears he had some transient AV dissociation post cardioversion but is back in SR on follow up. He has been asymptomatic. He denies bleeding issues with anticoagulation. He has OSA and struggles with using his CPAP. He is now on an oral device and follows with Dr Radford Pax and Dr Toy Cookey.  Patient is s/p repeat afib ablation 06/08/19 with Dr Curt Bears. Patient admitted 10/24-10/26/20 with acute CVA after holding anticoagulation for oral surgery. He is on Eliquis for a CHADS2VASC score of 6. Patient is s/p DCCV 11/20/19. Patient was back in atrial flutter at his visit with Dr Curt Bears on 06/10/20. His BB was stopped at that point 2/2 side effects of fatigue. Patient reports he feels like he has more energy off the BB however, his heart rate had been elevated around 120-130 at home.   On follow up today, patient reports that he is having more symptoms of palpitations and heart pounding. His heart rate at home is marginally improved, 110-120 bpm. He denies any missed doses of anticoagulation.   Today, he denies symptoms of SOB, chest pain, orthopnea, PND, lower extremity edema, presyncope, syncope, bleeding, or neurologic sequela. The patient is tolerating medications without  difficulties and is otherwise without complaint today.    Atrial Fibrillation Risk Factors:  he does have symptoms or diagnosis of sleep apnea. he is compliant with OSA therapy. Now on oral appliance.    he has a BMI of Body mass index is 20.53 kg/m.[name] Filed Weights   07/01/20 0856  Weight: 57.7 kg     Atrial Fibrillation Management history:  Previous antiarrhythmic drugs: Tikosyn (avoid amio given abn PFTs) Previous cardioversions: 12/11/18, 11/20/19 Previous ablations: 2015 at [facility], 06/08/19 CHADS2VASC score: 6 (age, HTN, CAD, CVA) Anticoagulation history: Xarelto, Eliquis   Past Medical History:  Diagnosis Date  . Abnormal nuclear cardiac imaging test March 2011   Has positive EKG response, no perfusion defect and normal EF  . Adenomatous colon polyp 12/2002  . Arrhythmia   . Atrial fibrillation ([place])    a. s/p rfca;  b. chronic tikosyn and xarelto.  . 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 Cataract    bil cataracts removed  . Chronic diastolic CHF (congestive heart failure) ([place])    a. 04/2017 Echo: EF 65-70%, restrictive physiology, mildly dil LA.  [name] Complication of anesthesia   . COPD (chronic obstructive pulmonary disease) ([place])   . Coronary artery disease    First MI in 1988. PCI in 1996 with subsequent CABG in 1996 due to restenosis. S/P stents to LCX in 2009. Noted to have residual disease in the LAD in a diffuse manner and atretic LIMA graft. He is managed medically.   . Difficult intubation    06/08/19 update -  Intubated with MAC3 with grade IIb view 7.0 tube passed easily  . Dysrhythmia    Afib  . Esophageal stricture   . GERD (gastroesophageal reflux disease)   . 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 History of Raynaud's syndrome   . Hyperlipidemia   . Hypertension   . Internal hemorrhoids   . Muscle spasm   . Myocardial infarction [facility])    MI x1 20 - 39 age  . Neuromuscular disorder  ([place])    Polio - age 18  . OSA (obstructive sleep apnea) 10/16/2018   Severe OSA with AHI 37.2/hr on BiPAP  . Persistent atrial fibrillation ([place])   . Polio    age 20  . PVC (premature ventricular contraction)   . Scoliosis    mild   Past Surgical History:  Procedure Laterality Date  . ABLATION     done for a fib  . APPENDECTOMY     Age 88  . ATRIAL FIBRILLATION ABLATION N/A 06/08/2019   Procedure: ATRIAL FIBRILLATION ABLATION;  Surgeon: Constance Haw, MD;  Location: [place] [facility];  Service: Cardiovascular;  Laterality: N/A;  . CARDIAC CATHETERIZATION    . CARDIOVERSION N/A 08/22/2013   Procedure: CARDIOVERSION;  Surgeon: Carlena Bjornstad, MD;  Location: [facility] ENDOSCOPY;  Service: Cardiovascular;  Laterality: N/A;  . CARDIOVERSION N/A 01/16/2014   Procedure: CARDIOVERSION;  Surgeon: Lelon Perla, MD;  Location: [facility] ENDOSCOPY;  Service: Cardiovascular;  Laterality: N/A;  . CARDIOVERSION N/A 12/11/2018   Procedure: CARDIOVERSION;  Surgeon: Larey Dresser, MD;  Location: [facility] ENDOSCOPY;  Service: Cardiovascular;  Laterality: N/A;  . CARDIOVERSION N/A 11/20/2019   Procedure: CARDIOVERSION;  Surgeon: [name], MD;  Location: [facility] ENDOSCOPY;  Service: Endoscopy;  Laterality: N/A;  . COLONOSCOPY  2008  . CORONARY ARTERY BYPASS GRAFT  1996   with a LIMA to the LAD, SVG to RCA and SVG to OM  . CORONARY STENT INTERVENTION N/A 03/26/2019   Procedure: CORONARY STENT INTERVENTION;  Surgeon: Jettie Booze, MD;  Location: [place];  Service: Cardiovascular;  Laterality: N/A;  . CORONARY STENT PLACEMENT  1996   LCX  . EMBOLECTOMY Right 05/09/2019   Procedure: Embolectomy of right radial artery;  Surgeon: Serafina Mitchell, MD;  Location: [place];  Service: Vascular;  Laterality: Right;  . ESOPHAGEAL DILATION    . EYE SURGERY    . FALSE ANEURYSM REPAIR Right 05/09/2019   Procedure: REPAIR FALSE ANEURYSM RIGHT RADIAL;  Surgeon: Serafina Mitchell, MD;  Location: [place];  Service: Vascular;  Laterality: Right;  . LEFT HEART CATH AND CORS/GRAFTS ANGIOGRAPHY N/A 03/26/2019   Procedure: LEFT HEART CATH AND CORS/GRAFTS ANGIOGRAPHY;  Surgeon: Larey Dresser, MD;  Location: [place] [facility];  Service: Cardiovascular;  Laterality: N/A;  . POLYPECTOMY    . TONSILLECTOMY    . UPPER GASTROINTESTINAL ENDOSCOPY     esophegeal dilation    Current Outpatient Medications  Medication Sig Dispense Refill  . acetaminophen (TYLENOL) 500 MG tablet Take 500 mg by mouth every 8 (eight) hours as needed for mild pain or moderate pain.     [name] albuterol (PROAIR HFA) 108 (90 BASE) MCG/ACT inhaler Inhale 2 puffs into the lungs every 4 (four) hours as needed for wheezing or shortness of breath (and prior to exercise). 1 Inhaler 3  . AMBULATORY NON FORMULARY MEDICATION Medication Name: Testosterone (AT) 150/0(150mg ) cpd    . dicyclomine (BENTYL) 10 MG capsule Take 10  mg by mouth 3 (three) times daily as needed for spasms.    [name] diltiazem (CARDIZEM CD) 120 MG 24 hr capsule Take 1 capsule (120 mg total) by mouth daily. 30 capsule 3  . dofetilide (TIKOSYN) 250 MCG capsule Take 250 mcg by mouth 2 (two) times daily.    [name] ELIQUIS 2.5 MG TABS tablet TAKE 1 TABLET BY MOUTH TWICE A DAY 180 tablet 1  . ezetimibe (ZETIA) 10 MG tablet TAKE 1 TABLET BY MOUTH EVERY DAY 90 tablet 3  . famotidine (PEPCID) 40 MG tablet Take 1 tablet (40 mg total) by mouth at bedtime. 90 tablet 3  . furosemide (LASIX) 20 MG tablet Take 40mg  daily alternating with 20mg  every other day 45 tablet 5  . KLOR-CON M20 20 MEQ tablet TAKE 1 TABLET BY MOUTH AT BEDTIME 90 tablet 3  . lactobacillus acidophilus & bulgar (LACTINEX) chewable tablet Chew 2 tablets by mouth daily.     [name] loperamide (IMODIUM) 2 MG capsule Take 2-4 mg by mouth as needed for diarrhea or loose stools.     [name] LORazepam (ATIVAN) 0.5 MG tablet Take 0.5 mg by mouth at bedtime.     . magnesium oxide (MAG-OX) 400 MG tablet Take 400 mg by mouth every evening.       . methocarbamol (ROBAXIN) 500 MG tablet Take 500 mg by mouth 3 (three) times daily as needed for muscle spasms.     . Probiotic Product (ALIGN) 4 MG CAPS Take 4 mg by mouth every evening.     . rosuvastatin (CRESTOR) 40 MG tablet TAKE 1 TABLET BY MOUTH EVERY DAY 90 tablet 3  . traZODone (DESYREL) 50 MG tablet Take 50 mg by mouth at bedtime.    . Vitamin D, Ergocalciferol, (DRISDOL) 50000 UNITS CAPS capsule Take 50,000 Units by mouth every 14 (fourteen) days. Take on 1st and 15th    . nitroGLYCERIN (NITROSTAT) 0.4 MG SL tablet Place 1 tablet (0.4 mg total) under the tongue every 5 (five) minutes as needed for chest pain. 25 tablet 0   No current facility-administered medications for this encounter.    Allergies  Allergen Reactions  . Morphine And Related Other (See Comments)    IV forms- vein irritation  . Tape Other (See Comments)    Skin Irritation    Social History   Socioeconomic History  . Marital status: Married    Spouse name: Inez Catalina  . Number of children: 2  . Years of education: Not on file  . Highest education level: Not on file  Occupational History  . Occupation: Pharmacist  Tobacco Use  . Smoking status: Former Smoker    Packs/day: 0.50    Years: 5.00    Pack years: 2.50    Types: Cigarettes    Quit date: 12/27/1966    Years since quitting: 53.5  . Smokeless tobacco: Former Systems developer    Quit date: Art therapist  . Vaping Use: Never used  Substance and Sexual Activity  . Alcohol use: No  . Drug use: No  . Sexual activity: Yes  Other Topics Concern  . 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 Difficulty of Paying Living Expenses: Not hard at all  Food Insecurity: No Food Insecurity  . Worried About Charity fundraiser in the Last  Year: Never true  . Ran Out of Food in the Last Year: Never true   Transportation Needs: No Transportation Needs  . Lack of Transportation (Medical): No  . Lack of Transportation (Non-Medical): No  Physical Activity: Inactive  . Days of Exercise per Week: 0 days  . Minutes of Exercise per Session: 0 min  Stress: Stress Concern Present  . Feeling of Stress : To some extent  Social Connections:   . Frequency of Communication with Friends and Family:   . Frequency of Social Gatherings with Friends and Family:   . Attends Religious Services:   . Active Member of Clubs or Organizations:   . Attends Archivist Meetings:   [name] Marital Status:   Intimate Partner Violence:   . Fear of Current or Ex-Partner:   . Emotionally Abused:   [name] Physically Abused:   . Sexually Abused:     Family History  Problem Relation Age of Onset  . Heart disease Mother   . Heart disease Father   . Heart Problems Brother        heart transplant  . Heart disease Maternal Grandmother   . Heart disease Maternal Grandfather   . Heart disease Paternal Grandmother   . Heart disease Paternal Grandfather   . Prostate cancer Paternal Uncle   . Cancer Son        thymic cancer.  died at age 25  . Colon cancer Neg Hx   . Esophageal cancer Neg Hx   . Pancreatic cancer Neg Hx   . Rectal cancer Neg Hx   . Stomach cancer Neg Hx     ROS- All systems are reviewed and negative except as per the HPI above.  Physical Exam: Vitals:   07/01/20 0856  BP: (!) 150/64  Pulse: (!) 126  Weight: 57.7 kg  Height: 5\' 6"  (1.676 m)    GEN- The patient is well appearing elderly male, alert and oriented x 3 today.   HEENT-head normocephalic, atraumatic, sclera clear, conjunctiva pink, hearing intact, trachea midline. Lungs- Clear to ausculation bilaterally, normal work of breathing Heart- irregular rate and rhythm, tachycardia, no murmurs, rubs or gallops  GI- soft, NT, ND, + BS Extremities- no clubbing, cyanosis, or edema MS- no significant deformity or atrophy Skin- no rash or  lesion Psych- euthymic mood, full affect Neuro- strength and sensation are intact   Wt Readings from Last 3 Encounters:  07/01/20 57.7 kg  06/24/20 55.7 kg  06/24/20 55.8 kg    EKG today demonstrates atypical atrial flutter HR126, QRS 100, QTc 509  Echo 03/26/19 demonstrated   1. The left ventricle has mildly reduced systolic function, with an ejection fraction of 45-50%. The cavity size was mildly dilated. Left ventricular diastolic Doppler parameters are indeterminate.  2. Posterior lateral and inferior basal hypokinesis.  3. The right ventricle has normal systolic function. The cavity was normal. There is no increase in right ventricular wall thickness.  4. Left atrial size was mildly dilated.  5. Right atrial size was mildly dilated.  6. The mitral valve is degenerative. Moderate thickening of the mitral valve leaflet. Mild calcification of the mitral valve leaflet. There is mild mitral annular calcification present.  7. Tricuspid valve regurgitation is mild-moderate.  8. The aortic valve is tricuspid. Moderate thickening of the aortic valve. Moderate sclerosis of the aortic valve.  Epic records reviewed.   Assessment and Plan:  1. Persistent atrial fibrillation/atypical atrial flutter S/p repeat afib ablation with Dr Curt Bears 06/08/19. Per procedure  report, flutter unable to be ablated due to complexity of atrial flutter. Patient remains in rapid atrial flutter. Will increase diltiazem to 240 mg daily. Decrease back to 120 mg daily after DCCV. Will arrange for DCCV. Check bmet/CBC Continue Eliquis 2.5 mg BID. Patient denies missed doses in the last 3 weeks.  Continue dofetilide 250 mcg BID.  This patients CHA2DS2-VASc Score and unadjusted Ischemic Stroke Rate (% per year) is equal to 9.7 % stroke rate/year from a score of 6  Above score calculated as 1 point each if present [CHF, HTN, DM, Vascular=MI/PAD/Aortic Plaque, Age if 65-74, or Male] Above score calculated as 2  points each if present [Age > 75, or Stroke/TIA/TE]  2. Obstructive sleep apnea Unable to tolerate facemask.  Patient using oral device.   3. CAD/Ischemic CM No anginal symptoms.   4. HTN Stable, med changes as above.    Follow up with Dr Aundra Dubin and Dr Curt Bears as scheduled.    [facility] [address] [place], [place] [address] [phone]

## 2020-07-03 ENCOUNTER — Other Ambulatory Visit (HOSPITAL_COMMUNITY): Payer: Self-pay | Admitting: *Deleted

## 2020-07-08 ENCOUNTER — Other Ambulatory Visit (HOSPITAL_COMMUNITY)
Admission: RE | Admit: 2020-07-08 | Discharge: 2020-07-08 | Disposition: A | Discharge status: Home / Self Care | Payer: Medicare Other | Source: Ambulatory Visit | Attending: Cardiology | Admitting: Cardiology

## 2020-07-08 DIAGNOSIS — Z20822 Contact with and (suspected) exposure to covid-19: Secondary | ICD-10-CM | POA: Insufficient documentation

## 2020-07-08 LAB — SARS CORONAVIRUS 2 (TAT 6-24 HRS): SARS Coronavirus 2: NEGATIVE

## 2020-07-09 NOTE — Anesthesia Preprocedure Evaluation (Addendum)
Anesthesia Evaluation  Patient identified by MRN, date of birth, ID band Patient awake    Reviewed: Allergy & Precautions, NPO status , Patient's Chart, lab work & pertinent test results  History of Anesthesia Complications (+) DIFFICULT AIRWAY and history of anesthetic complications  Airway Mallampati: II  TM Distance: >3 FB Neck ROM: Full    Dental no notable dental hx. (+) Implants, Teeth Intact, Dental Advisory Given   Pulmonary sleep apnea , COPD, former smoker,    Pulmonary exam normal breath sounds clear to auscultation       Cardiovascular hypertension, + CAD, + Past MI and +CHF  Normal cardiovascular exam+ dysrhythmias Atrial Fibrillation  Rhythm:Regular Rate:Normal  10/20 EF 50-55%   Neuro/Psych CVA    GI/Hepatic negative GI ROS, Neg liver ROS,   Endo/Other  negative endocrine ROS  Renal/GU negative Renal ROS     Musculoskeletal negative musculoskeletal ROS (+)   Abdominal   Peds  Hematology negative hematology ROS (+)   Anesthesia Other Findings   Reproductive/Obstetrics                            Anesthesia Physical Anesthesia Plan  ASA: III  Anesthesia Plan: General   Post-op Pain Management:    Induction: Intravenous  PONV Risk Score and Plan: Treatment may vary due to age or medical condition  Airway Management Planned: Natural Airway and Nasal Cannula  Additional Equipment: None  Intra-op Plan:   Post-operative Plan:   Informed Consent: I have reviewed the patients History and Physical, chart, labs and discussed the procedure including the risks, benefits and alternatives for the proposed anesthesia with the patient or authorized representative who has indicated his/her understanding and acceptance.     Dental advisory given  Plan Discussed with:   Anesthesia Plan Comments:        Anesthesia Quick Evaluation

## 2020-07-10 ENCOUNTER — Ambulatory Visit (HOSPITAL_COMMUNITY): Payer: Medicare Other | Admitting: Anesthesiology

## 2020-07-10 ENCOUNTER — Ambulatory Visit (HOSPITAL_COMMUNITY)
Admission: RE | Admit: 2020-07-10 | Discharge: 2020-07-10 | Disposition: A | Discharge status: Home / Self Care | Payer: Medicare Other | Attending: Cardiology | Admitting: Cardiology

## 2020-07-10 ENCOUNTER — Other Ambulatory Visit: Payer: Self-pay

## 2020-07-10 ENCOUNTER — Encounter (HOSPITAL_COMMUNITY): Payer: Self-pay | Admitting: Cardiology

## 2020-07-10 ENCOUNTER — Encounter (HOSPITAL_COMMUNITY)
Admission: RE | Disposition: A | Discharge status: Home / Self Care | Payer: Self-pay | Source: Home / Self Care | Attending: Cardiology

## 2020-07-10 DIAGNOSIS — I484 Atypical atrial flutter: Secondary | ICD-10-CM | POA: Diagnosis not present

## 2020-07-10 DIAGNOSIS — Z87891 Personal history of nicotine dependence: Secondary | ICD-10-CM | POA: Insufficient documentation

## 2020-07-10 DIAGNOSIS — I5033 Acute on chronic diastolic (congestive) heart failure: Secondary | ICD-10-CM | POA: Diagnosis not present

## 2020-07-10 DIAGNOSIS — Z8249 Family history of ischemic heart disease and other diseases of the circulatory system: Secondary | ICD-10-CM | POA: Insufficient documentation

## 2020-07-10 DIAGNOSIS — I4891 Unspecified atrial fibrillation: Secondary | ICD-10-CM | POA: Diagnosis not present

## 2020-07-10 DIAGNOSIS — J449 Chronic obstructive pulmonary disease, unspecified: Secondary | ICD-10-CM | POA: Diagnosis not present

## 2020-07-10 DIAGNOSIS — E785 Hyperlipidemia, unspecified: Secondary | ICD-10-CM | POA: Diagnosis not present

## 2020-07-10 DIAGNOSIS — M419 Scoliosis, unspecified: Secondary | ICD-10-CM | POA: Insufficient documentation

## 2020-07-10 DIAGNOSIS — G4733 Obstructive sleep apnea (adult) (pediatric): Secondary | ICD-10-CM | POA: Diagnosis not present

## 2020-07-10 DIAGNOSIS — Z79899 Other long term (current) drug therapy: Secondary | ICD-10-CM | POA: Diagnosis not present

## 2020-07-10 DIAGNOSIS — Z951 Presence of aortocoronary bypass graft: Secondary | ICD-10-CM | POA: Insufficient documentation

## 2020-07-10 DIAGNOSIS — I5032 Chronic diastolic (congestive) heart failure: Secondary | ICD-10-CM | POA: Insufficient documentation

## 2020-07-10 DIAGNOSIS — I2510 Atherosclerotic heart disease of native coronary artery without angina pectoris: Secondary | ICD-10-CM | POA: Diagnosis not present

## 2020-07-10 DIAGNOSIS — K219 Gastro-esophageal reflux disease without esophagitis: Secondary | ICD-10-CM | POA: Diagnosis not present

## 2020-07-10 DIAGNOSIS — Z8612 Personal history of poliomyelitis: Secondary | ICD-10-CM | POA: Insufficient documentation

## 2020-07-10 DIAGNOSIS — Z955 Presence of coronary angioplasty implant and graft: Secondary | ICD-10-CM | POA: Diagnosis not present

## 2020-07-10 DIAGNOSIS — I110 Hypertensive heart disease with heart failure: Secondary | ICD-10-CM | POA: Diagnosis not present

## 2020-07-10 DIAGNOSIS — I255 Ischemic cardiomyopathy: Secondary | ICD-10-CM | POA: Diagnosis not present

## 2020-07-10 DIAGNOSIS — Z885 Allergy status to narcotic agent status: Secondary | ICD-10-CM | POA: Insufficient documentation

## 2020-07-10 DIAGNOSIS — Z7901 Long term (current) use of anticoagulants: Secondary | ICD-10-CM | POA: Diagnosis not present

## 2020-07-10 DIAGNOSIS — I252 Old myocardial infarction: Secondary | ICD-10-CM | POA: Diagnosis not present

## 2020-07-10 HISTORY — PX: CARDIOVERSION: SHX1299

## 2020-07-10 LAB — POCT I-STAT, CHEM 8
BUN: 21 mg/dL (ref 8–23)
Calcium, Ion: 1.23 mmol/L (ref 1.15–1.40)
Chloride: 99 mmol/L (ref 98–111)
Creatinine, Ser: 1.5 mg/dL — ABNORMAL HIGH (ref 0.61–1.24)
Glucose, Bld: 93 mg/dL (ref 70–99)
HCT: 40 % (ref 39.0–52.0)
Hemoglobin: 13.6 g/dL (ref 13.0–17.0)
Potassium: 4.3 mmol/L (ref 3.5–5.1)
Sodium: 138 mmol/L (ref 135–145)
TCO2: 27 mmol/L (ref 22–32)

## 2020-07-10 SURGERY — CARDIOVERSION
Anesthesia: General

## 2020-07-10 MED ORDER — PROPOFOL 10 MG/ML IV BOLUS
INTRAVENOUS | Status: DC | PRN
  Administered 2020-07-10: 60 mg via INTRAVENOUS

## 2020-07-10 MED ORDER — SODIUM CHLORIDE 0.9 % IV SOLN: INTRAVENOUS | Status: DC | PRN

## 2020-07-10 MED ORDER — LIDOCAINE 2% (20 MG/ML) 5 ML SYRINGE
INTRAMUSCULAR | Status: DC | PRN
  Administered 2020-07-10: 60 mg via INTRAVENOUS

## 2020-07-10 MED ORDER — DILTIAZEM HCL ER COATED BEADS 120 MG PO CP24
120.0000 mg | ORAL_CAPSULE | Freq: Every day | ORAL | 3 refills | Status: DC
Start: 2020-07-10 — End: 2020-08-08

## 2020-07-10 NOTE — Anesthesia Procedure Notes (Signed)
Procedure Name: General with mask airway Date/Time: 07/10/2020 8:39 AM Performed by: Alain Marion, CRNA Pre-anesthesia Checklist: Patient identified, Emergency Drugs available, Suction available and Patient being monitored Patient Re-evaluated:Patient Re-evaluated prior to induction Oxygen Delivery Method: Ambu bag Preoxygenation: Pre-oxygenation with 100% oxygen Placement Confirmation: positive ETCO2

## 2020-07-10 NOTE — Procedures (Signed)
Electrical Cardioversion Procedure Note [name] [id number] 1936/01/26  Procedure: Electrical Cardioversion Indications:  Atrial Fibrillation  Procedure Details Consent: Risks of procedure as well as the alternatives and risks of each were explained to the (patient/caregiver).  Consent for procedure obtained. Time Out: Verified patient identification, verified procedure, site/side was marked, verified correct patient position, special equipment/implants available, medications/allergies/relevent history reviewed, required imaging and test results available.  Performed  Patient placed on cardiac monitor, pulse oximetry, supplemental oxygen as necessary.  Sedation given: Per anesthesiology Pacer pads placed anterior and posterior chest.  Cardioverted 1 time(s).  Cardioverted at [place].  Evaluation Findings: Post procedure EKG shows: NSR Complications: None Patient did tolerate procedure well.   [name] 07/10/2020, 8:47 AM

## 2020-07-10 NOTE — Transfer of Care (Signed)
Immediate Anesthesia Transfer of Care Note  Patient: [name]  Procedure(s) Performed: CARDIOVERSION (N/A )  Patient Location: Endoscopy Unit  Anesthesia Type:General  Level of Consciousness: awake, alert  and oriented  Airway & Oxygen Therapy: Patient Spontanous Breathing  Post-op Assessment: Report given to RN and Post -op Vital signs reviewed and stable  Post vital signs: Reviewed and stable  Last Vitals:  Vitals Value Taken Time  BP 112/46 07/10/20 0846  Temp    Pulse 70 07/10/20 0848  Resp 20 07/10/20 0848  SpO2 94 % 07/10/20 0848    Last Pain:  Vitals:   07/10/20 0742  TempSrc: Oral  PainSc: 0-No pain         Complications: No complications documented.

## 2020-07-10 NOTE — Interval H&P Note (Signed)
History and Physical Interval Note:  07/10/2020 8:42 AM  [name]  has presented today for surgery, with the diagnosis of AFIB.  The various methods of treatment have been discussed with the patient and family. After consideration of risks, benefits and other options for treatment, the patient has consented to  Procedure(s): CARDIOVERSION (N/A) as a surgical intervention.  The patient's history has been reviewed, patient examined, no change in status, stable for surgery.  I have reviewed the patient's chart and labs.  Questions were answered to the patient's satisfaction.     Apolo Cutshaw [employer]

## 2020-07-10 NOTE — Anesthesia Postprocedure Evaluation (Signed)
Anesthesia Post Note  Patient: [name]  Procedure(s) Performed: CARDIOVERSION (N/A )     Patient location during evaluation: PACU Anesthesia Type: General Level of consciousness: awake and alert Pain management: pain level controlled Vital Signs Assessment: post-procedure vital signs reviewed and stable Respiratory status: spontaneous breathing, nonlabored ventilation, respiratory function stable and patient connected to nasal cannula oxygen Cardiovascular status: blood pressure returned to baseline and stable Postop Assessment: no apparent nausea or vomiting Anesthetic complications: no   No complications documented.  Last Vitals:  Vitals:   07/10/20 0910 07/10/20 0915  BP: (!) 112/52 110/73  Pulse: 64 62  Resp: 16 16  Temp:    SpO2: 98% 100%    Last Pain:  Vitals:   07/10/20 0915  TempSrc:   PainSc: 0-No pain                 Barnet Glasgow

## 2020-07-10 NOTE — Discharge Instructions (Signed)
Electrical Cardioversion Electrical cardioversion is the delivery of a jolt of electricity to restore a normal rhythm to the heart. A rhythm that is too fast or is not regular keeps the heart from pumping well. In this procedure, sticky patches or metal paddles are placed on the chest to deliver electricity to the heart from a device.  Follow these instructions at home:  Do not drive for 24 hours if you were given a sedative during your procedure.  Take over-the-counter and prescription medicines only as told by your health care provider.  Ask your health care provider how to check your pulse. Check it often.  Rest for 48 hours after the procedure or as told by your health care provider.  Avoid or limit your caffeine use as told by your health care provider.  Keep all follow-up visits as told by your health care provider. This is important. Contact a health care provider if:  You feel like your heart is beating too quickly or your pulse is not regular.  You have a serious muscle cramp that does not go away. Get help right away if:  You have discomfort in your chest.  You are dizzy or you feel faint.  You have trouble breathing or you are short of breath.  Your speech is slurred.  You have trouble moving an arm or leg on one side of your body.  Your fingers or toes turn cold or blue. Summary  Electrical cardioversion is the delivery of a jolt of electricity to restore a normal rhythm to the heart.  This procedure may be done right away in an emergency or may be a scheduled procedure if the condition is not an emergency.  Generally, this is a safe procedure.  After the procedure, check your pulse often as told by your health care provider. This information is not intended to replace advice given to you by your health care provider. Make sure you discuss any questions you have with your health care provider. Document Revised: 07/16/2019 Document Reviewed: 07/16/2019 Elsevier  Patient Education  2020 [employer]  

## 2020-07-11 ENCOUNTER — Encounter (HOSPITAL_COMMUNITY): Payer: Medicare Other | Admitting: Cardiology

## 2020-07-14 ENCOUNTER — Encounter (HOSPITAL_COMMUNITY): Payer: Self-pay | Admitting: Cardiology

## 2020-07-17 ENCOUNTER — Ambulatory Visit (HOSPITAL_COMMUNITY): Payer: Medicare Other | Admitting: Physician Assistant

## 2020-08-01 IMAGING — MR MR MRA NECK WO/W CM
3 of 4 series · 45 of 48 positions shown · IV contrast (gadavist)
Comparison: Brain MRI performed earlier the same day 10/20/2019,

CLINICAL DATA: Stroke, follow-up.

EXAM:
MRA NECK WITHOUT AND WITH CONTRAST
TECHNIQUE: Multiplanar and multiecho pulse sequences of the neck were obtained
without and with intravenous contrast. Angiographic images of the
neck were obtained using MRA technique without and with intravenous
contrast.
CONTRAST:  5mL GADAVIST GADOBUTROL 1 MMOL/ML IV SOLN

[Series 30: angio_fl3d_cor_pre_ttc=3.0s · coronal · B · 0.9mm · 0.85mm/px · 15 of 80 slices shown]
[im 1/80]
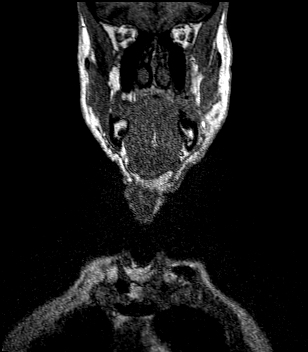
[im 6/80]
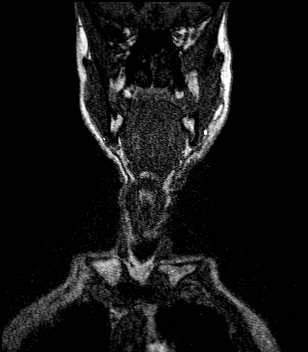
[im 12/80]
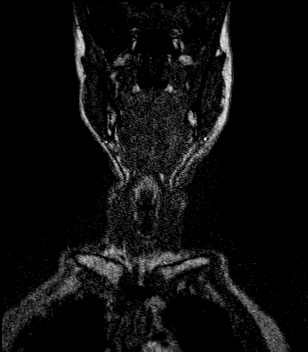
[im 17/80]
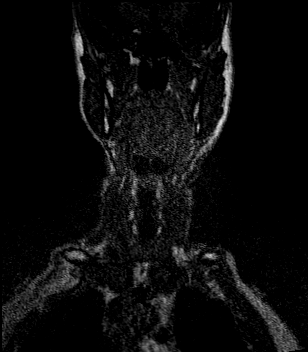
[im 23/80]
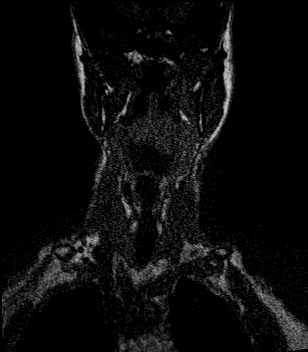
[im 29/80]
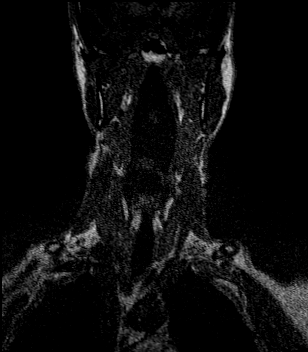
[im 34/80]
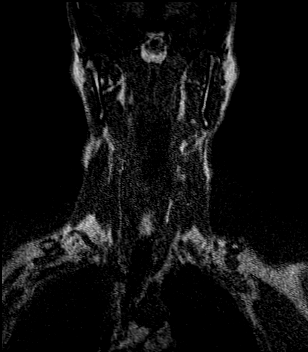
[im 40/80]
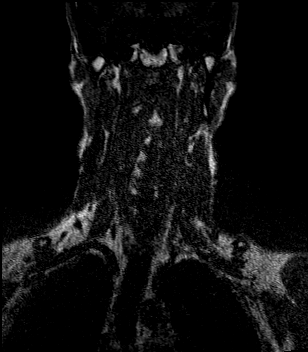
[im 46/80]
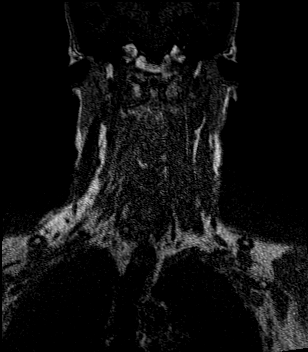
[im 51/80]
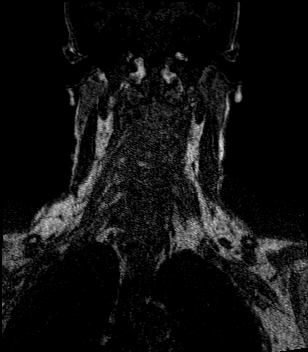
[im 57/80]
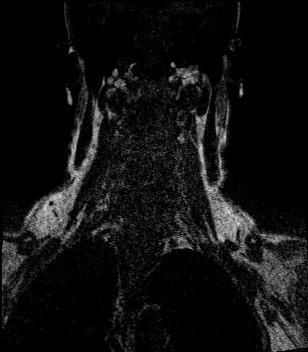
[im 63/80]
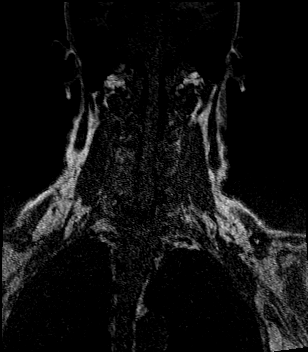
[im 68/80]
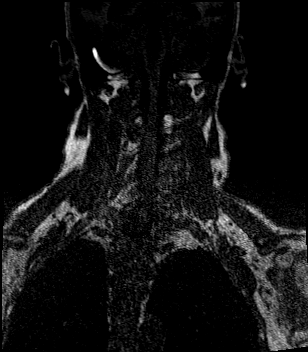
[im 74/80]
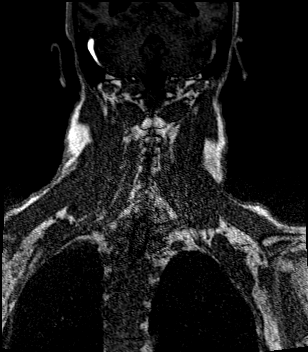
[im 80/80]
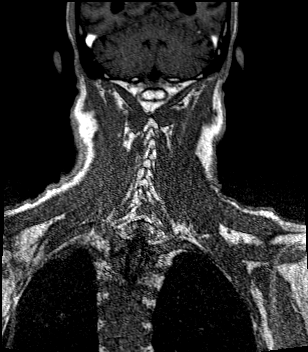

[Series 32: angio_fl3d_cor_post_ttc=3.0s · coronal · B · 0.9mm · 0.85mm/px · 15 of 80 slices shown (1 of 2)]
[im 1/80]
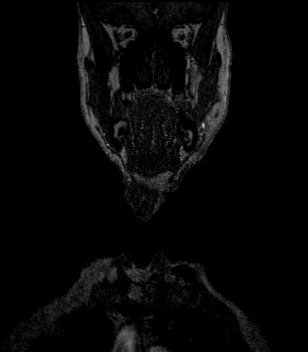
[im 6/80]
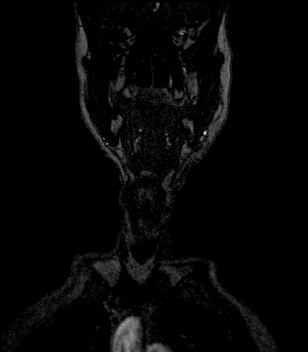
[im 12/80]
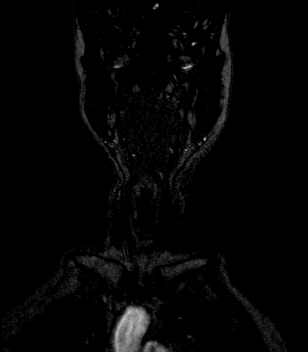
[im 17/80]
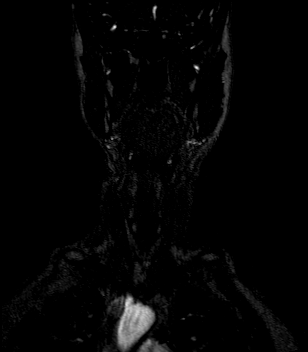
[im 23/80]
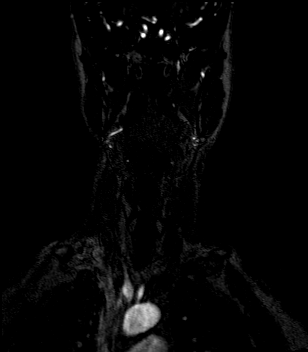
[im 29/80]
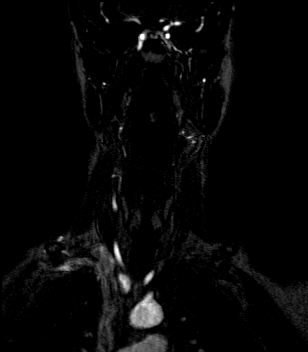
[im 34/80]
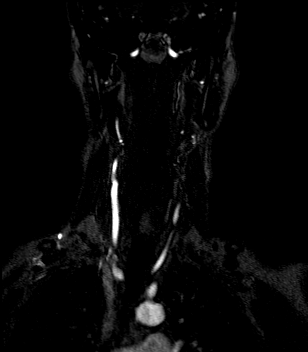
[im 40/80]
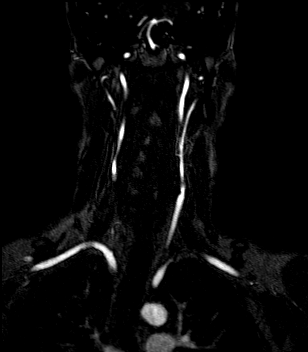
[im 46/80]
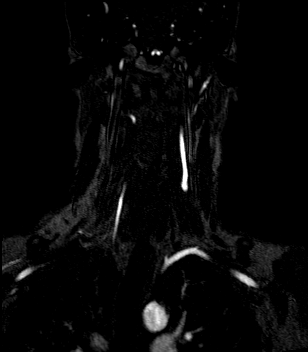
[im 51/80]
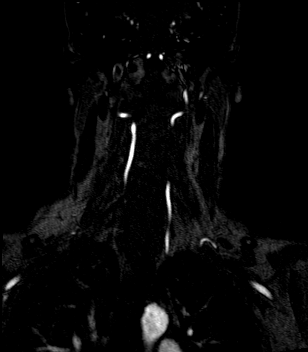
[im 57/80]
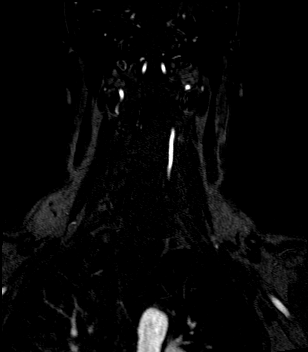
[im 63/80]
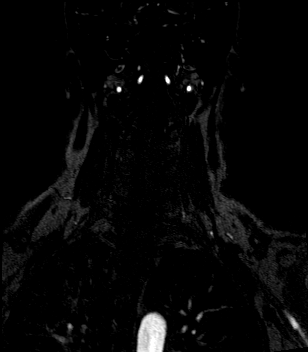
[im 68/80]
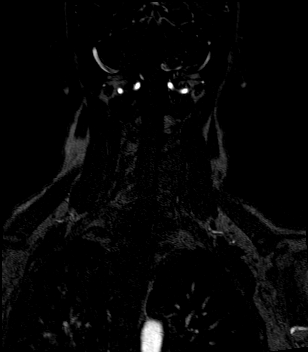
[im 74/80]
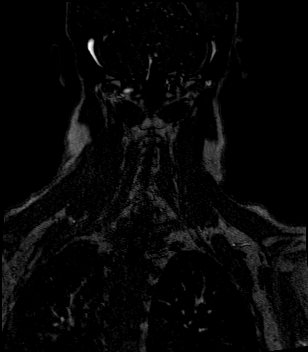
[im 80/80]
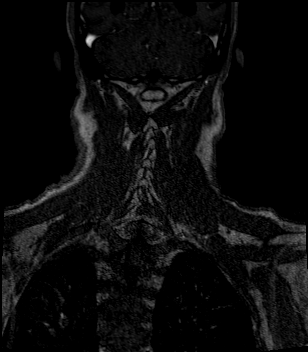

[Series 33: angio_fl3d_cor_post_ttc=3.0s · coronal · B · 0.9mm · 0.85mm/px · 15 of 80 slices shown (2 of 2)]
[im 1/80]
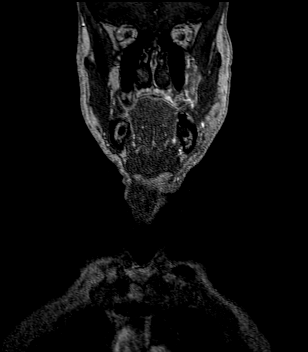
[im 6/80]
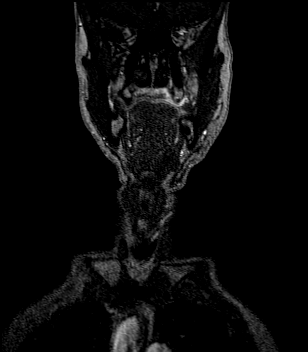
[im 12/80]
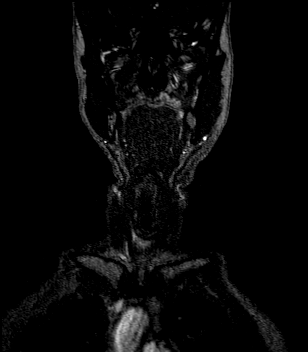
[im 17/80]
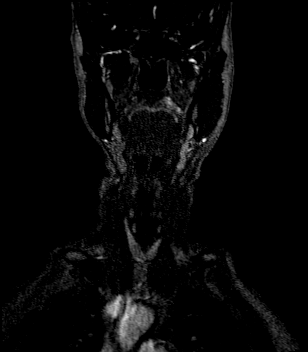
[im 23/80]
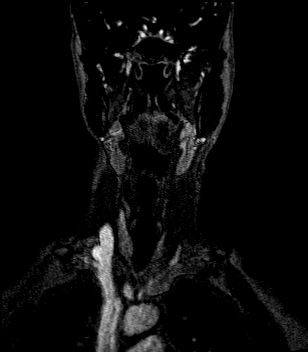
[im 29/80]
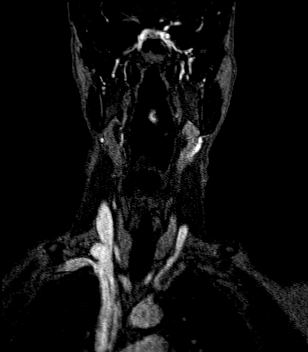
[im 34/80]
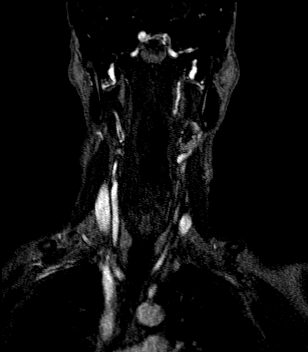
[im 40/80]
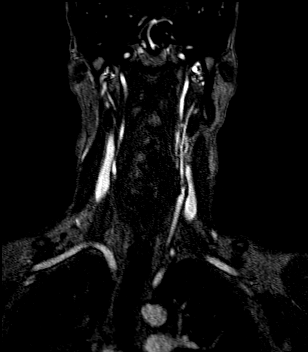
[im 46/80]
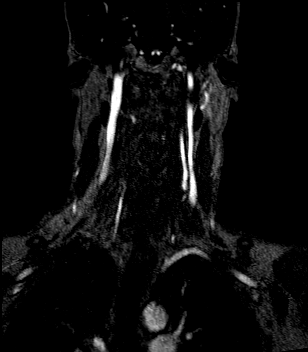
[im 51/80]
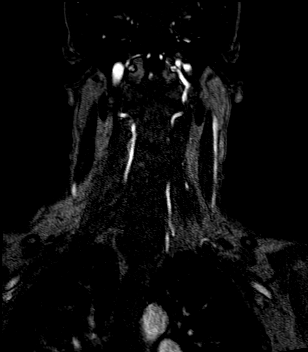
[im 57/80]
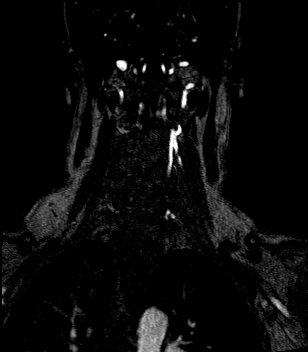
[im 63/80]
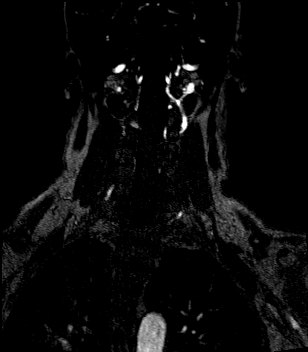
[im 68/80]
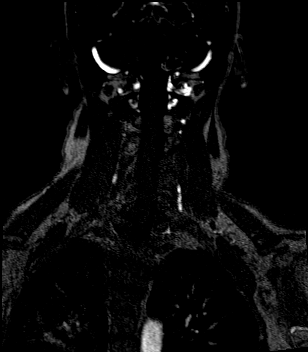
[im 74/80]
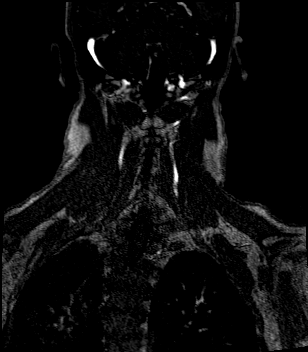
[im 80/80]
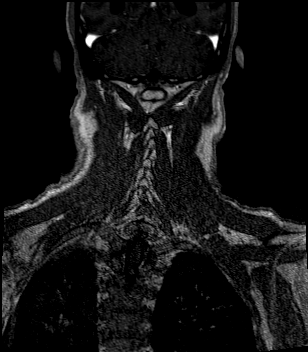

[45 of 48 positions shown; findings below may reference images not displayed]

FINDINGS: Standard aortic branching.

The right common and internal carotid arteries are widely patent
within the neck without stenosis.

The left common and internal carotid arteries are patent within the
neck without significant stenosis (50% or greater). Mild focal
atherosclerotic narrowing of the mid to distal left common carotid
artery. Atherosclerotic irregularity of the distal left common
carotid artery with less than 50% stenosis. No measurable stenosis
within the proximal left ICA. No appreciable stenosis within the
intracranial internal carotid arteries or M1 middle cerebral
arteries.

The bilateral vertebral arteries are widely patent within the neck
without significant stenosis. No appreciable stenosis within the
intracranial vertebral arteries or basilar artery.
IMPRESSION: Bilateral common and internal carotid arteries patent within the
neck. Atherosclerotic irregularity of the distal left common carotid
artery with less than 50% stenosis. Mild focal atherosclerotic
narrowing is also present more proximally within the mid to distal
left common carotid artery.

Bilateral vertebral arteries patent within the neck without
significant stenosis.

## 2020-08-05 ENCOUNTER — Encounter (HOSPITAL_COMMUNITY): Payer: Medicare Other | Admitting: Cardiology

## 2020-08-08 ENCOUNTER — Other Ambulatory Visit: Payer: Self-pay

## 2020-08-08 ENCOUNTER — Ambulatory Visit (HOSPITAL_COMMUNITY)
Admission: RE | Admit: 2020-08-08 | Discharge: 2020-08-08 | Disposition: A | Discharge status: Home / Self Care | Payer: Medicare Other | Source: Ambulatory Visit | Attending: Cardiology | Admitting: Cardiology

## 2020-08-08 VITALS — BP 158/60 | HR 115 | Wt 124.4 lb

## 2020-08-08 DIAGNOSIS — Z888 Allergy status to other drugs, medicaments and biological substances status: Secondary | ICD-10-CM | POA: Diagnosis not present

## 2020-08-08 DIAGNOSIS — E785 Hyperlipidemia, unspecified: Secondary | ICD-10-CM | POA: Diagnosis not present

## 2020-08-08 DIAGNOSIS — Q791 Other congenital malformations of diaphragm: Secondary | ICD-10-CM | POA: Diagnosis not present

## 2020-08-08 DIAGNOSIS — Z885 Allergy status to narcotic agent status: Secondary | ICD-10-CM | POA: Diagnosis not present

## 2020-08-08 DIAGNOSIS — Z9049 Acquired absence of other specified parts of digestive tract: Secondary | ICD-10-CM | POA: Diagnosis not present

## 2020-08-08 DIAGNOSIS — Z7901 Long term (current) use of anticoagulants: Secondary | ICD-10-CM | POA: Insufficient documentation

## 2020-08-08 DIAGNOSIS — Z79899 Other long term (current) drug therapy: Secondary | ICD-10-CM | POA: Diagnosis not present

## 2020-08-08 DIAGNOSIS — I4819 Other persistent atrial fibrillation: Secondary | ICD-10-CM | POA: Diagnosis not present

## 2020-08-08 DIAGNOSIS — G4733 Obstructive sleep apnea (adult) (pediatric): Secondary | ICD-10-CM | POA: Insufficient documentation

## 2020-08-08 DIAGNOSIS — Z8673 Personal history of transient ischemic attack (TIA), and cerebral infarction without residual deficits: Secondary | ICD-10-CM | POA: Diagnosis not present

## 2020-08-08 DIAGNOSIS — N183 Chronic kidney disease, stage 3 unspecified: Secondary | ICD-10-CM | POA: Diagnosis not present

## 2020-08-08 DIAGNOSIS — I252 Old myocardial infarction: Secondary | ICD-10-CM | POA: Diagnosis not present

## 2020-08-08 DIAGNOSIS — I255 Ischemic cardiomyopathy: Secondary | ICD-10-CM | POA: Diagnosis not present

## 2020-08-08 DIAGNOSIS — I2510 Atherosclerotic heart disease of native coronary artery without angina pectoris: Secondary | ICD-10-CM | POA: Insufficient documentation

## 2020-08-08 DIAGNOSIS — I484 Atypical atrial flutter: Secondary | ICD-10-CM | POA: Diagnosis not present

## 2020-08-08 DIAGNOSIS — Z951 Presence of aortocoronary bypass graft: Secondary | ICD-10-CM | POA: Insufficient documentation

## 2020-08-08 DIAGNOSIS — G14 Postpolio syndrome: Secondary | ICD-10-CM | POA: Diagnosis not present

## 2020-08-08 DIAGNOSIS — Z955 Presence of coronary angioplasty implant and graft: Secondary | ICD-10-CM | POA: Insufficient documentation

## 2020-08-08 DIAGNOSIS — I7300 Raynaud's syndrome without gangrene: Secondary | ICD-10-CM | POA: Diagnosis not present

## 2020-08-08 DIAGNOSIS — K219 Gastro-esophageal reflux disease without esophagitis: Secondary | ICD-10-CM | POA: Insufficient documentation

## 2020-08-08 DIAGNOSIS — Z8249 Family history of ischemic heart disease and other diseases of the circulatory system: Secondary | ICD-10-CM | POA: Diagnosis not present

## 2020-08-08 DIAGNOSIS — I5032 Chronic diastolic (congestive) heart failure: Secondary | ICD-10-CM | POA: Diagnosis not present

## 2020-08-08 DIAGNOSIS — I4891 Unspecified atrial fibrillation: Secondary | ICD-10-CM | POA: Insufficient documentation

## 2020-08-08 LAB — LIPID PANEL
Cholesterol: 131 mg/dL (ref 0–200)
HDL: 52 mg/dL (ref >40)
LDL Cholesterol: 69 mg/dL (ref 0–99)
Total CHOL/HDL Ratio: 2.5 RATIO
Triglycerides: 50 mg/dL (ref <150)
VLDL: 10 mg/dL (ref 0–40)

## 2020-08-08 LAB — BASIC METABOLIC PANEL
Anion gap: 9 (ref 5–15)
BUN: 15 mg/dL (ref 8–23)
CO2: 27 mmol/L (ref 22–32)
Calcium: 10 mg/dL (ref 8.9–10.3)
Chloride: 101 mmol/L (ref 98–111)
Creatinine, Ser: 1.4 mg/dL — ABNORMAL HIGH (ref 0.61–1.24)
GFR calc Af Amer: 54 mL/min — ABNORMAL LOW (ref >60)
GFR calc non Af Amer: 46 mL/min — ABNORMAL LOW (ref >60)
Glucose, Bld: 101 mg/dL — ABNORMAL HIGH (ref 70–99)
Potassium: 4 mmol/L (ref 3.5–5.1)
Sodium: 137 mmol/L (ref 135–145)

## 2020-08-08 MED ORDER — DILTIAZEM HCL ER COATED BEADS 240 MG PO CP24
240.0000 mg | ORAL_CAPSULE | Freq: Every day | ORAL | 6 refills | Status: DC
Start: 2020-08-08 — End: 2021-02-16

## 2020-08-08 NOTE — Patient Instructions (Addendum)
Increase Diltiazem CD to 240 mg Daily  Labs done today, your results will be available in MyChart, we will contact you for abnormal readings.  You have been referred to follow up in the [facility]:  Friday 8/27 at 8:30 AM  Your physician recommends that you schedule a follow-up appointment in: 2 months  If you have any questions or concerns before your next appointment please send [place] a message through [place] or call our office at [phone].    TO LEAVE A MESSAGE FOR THE NURSE SELECT OPTION 2, PLEASE LEAVE A MESSAGE INCLUDING: . YOUR NAME . DATE OF BIRTH . CALL BACK NUMBER . REASON FOR CALL**this is important as we prioritize the call backs  [place] AS LONG AS YOU CALL BEFORE 4:00 PM  At the [facility], you and your health needs are our priority. As part of our continuing mission to provide you with exceptional heart care, we have created designated Provider Care Teams. These Care Teams include your primary Cardiologist (physician) and Advanced Practice Providers (APPs- Physician Assistants and Nurse Practitioners) who all work together to provide you with the care you need, when you need it.   You may see any of the following providers on your designated Care Team at your next follow up: [name] Dr Glori Bickers . Dr [name] . Darrick Grinder, NP . Lyda Jester, PA . Audry Riles, PharmD   Please be sure to bring in all your medications bottles to every appointment.

## 2020-08-10 NOTE — Progress Notes (Signed)
ID:  [name], DOB [birth year], MRN [id number]    Provider location: [facility] Type of Visit: Established patient   PCP:  Burnard Bunting, MD  Cardiologist:  [name]   History of Present Illness: [name] is a 84 y.o. male who has history of CAD s/p CABG.  Cath in 11/09 showed that the SVG-distal RCA was patent, the CFX system was patent.  His LIMA was atretic and there were serial 60% and 80% stenoses in the native LAD.  He was managed medically.  Echo in 8/14 showed EF 55-60% with moderate MR and moderate TR.   He was initially noted to have atrial fibrillation in the summer of 2014. He was started on Xarelto and cardioverted to NSR in 8/14.  Recurrent atrial fibrillation was noted in 1/15, and he was cardioverted to NSR again.  This time, NSR did not hold long. By 5/15, he was in persistent atrial fibrillation.  I referred him to [facility] where he had atrial fibrillation ablation and Tikosyn loading.  Ranolazine was stopped due to risk of QT prolongation and Imdur was started as an anti-anginal instead.  Lexiscan Cardiolite in 11/15 showed no ischemia.    CTA chest done in 2/16 to look for evidence for PV stenosis post-AF ablation.  This showed mild short-segment narrowing of the left inferior pulmonary vein.   Patient was admitted in 5/18 with acute on chronic diastolic CHF.  He had been [place] the beach for [place] days and ate out a number of times, probably getting a significant sodium load.  No chest pain.  He was in normal sinus rhythm.  He was started on IV Lasix and diuresed.  Echo in 5/18 showed EF 65-70% with moderately dilated RV and normal systolic function, PASP 57 mmHg.  Lexiscan Cardiolite in 6/18 showed no significant perfusion defect.   He was admitted with NSTEMI in 3/20, had DES to ostial LAD and mid SVG-RCA.  Echo showed EF 45-50%.  He was in atypical atrial flutter persistently despite Tikosyn.  After his intervention, he was noted to  have a radial artery pseudoaneurysm that was repaired by Dr. Trula Slade.   In 6/20, he had a redo atrial fibrillation ablation.  He was also noted to have complex flutter from multiple foci that was not ablated.  After the procedure, he has had considerable pain in the right leg at the cath site but also radiating down the leg, groin US showed no AV fistula or pseudoaneurysm.    In 10/20, patient held anticoagulation for 2 days for oral surgery and had a CVA presenting as right hand weakness.  He did not get tPA.  Echo in 10/20 showed EF 50-55%, mild LVH, moderately decreased RV function, small PFO. Carotid dopplers showed 1-39% bilateral stenosis. He was switched from Xarelto to Eliquis.   In 11/20, he went into persistent atrial fibrillation and underwent DCCV.  In 7/21, he went into atypical flutter and had DCCV to NSR.    He returns for followup of CHF, atrial fibrillation, and CAD.  Since cardioversion in 7/21, he feels like he has been in and out of atrial flutter.  He brings a HR record for the last several weeks, on some days HR is in 70s and on some days HR is in the 110s-120s.  When he is in atrial flutter with elevated HR, he feels significantly more fatigued with activity.  Stable dyspnea with moderate exertion like walking up stairs  and hills.  He still goes to work on most days.  He is off beta blockers due to increased fatigue when taking them.  Today, he is in atypical flutter with rate 110s.   ECG (personally reviewed): atypical flutter, rate 115, nonspecific T wave changes, QTc 488 msec.   Labs (8/12): K 4.1, creatinine 1.2, LDL 91, HDL 53 Labs (8/14): K 4.6, creatinine 1.1 Labs (11/14): K 4.6, creatinine 1.2, LDL 91, HDL 56 Labs (3/15): AST 38, ALT 32, TSH normal, BNP 237 Labs (7/15): LDL 96, HDL 48, K 4.2, creatinine 1.2, TSH normal, BNP 237, AST 38, ALT 32 Labs (8/15): LFTs normal Labs (11/15): K 4.2, creatinine 1.1, Mg 2.2, BNP 227 Labs (2/16): K 4, creatinine 1.32, BNP  261 Labs (6/18): K 3.9, creatinine 1.6 Labs (7/18): K 3.9, creatinine 1.5, BNP 242 Labs (12/18): K 3.8, creatinine 1.4, hgb 13.9, LDL 73, HDL 52 Labs (3/19): K 3.7, creatinine 1.4, LDL 76, HDL 42 Labs (6/19): K 4.1, creatinine 1.63 Labs (3/20): K 4.2, creatinine 1.29, LDL 53, HDL 47, hgb 13.8 Labs (4/20): K 4.3, creatinine 1.5 Labs (6/20): K 4.2, creatinine 1.84, hgb 12.4 Labs (7/20): K 4.5, creatinine 2.53 => 1.72 Labs (8/20): K 4.7, creatinine 1.5, Mg 2.4 Labs (12/20): LDL 79, K 4.9, creatinine 1.4 Labs (2/21): K 4.5, creatinine 1.56 Labs (8/21): K 4, creatinine 1.4  PMH: 1. CAD: 1st MI in 1988.  CABG 1996.  PCI to CFX in 2009.  LHC (11/09) SVG-dRCA patent, total occlusion RCA, patent CFX stent, atretic LIMA, serial 60 and 80% proximal LAD stenoses, EF 60% with basal inferior hypokinesis.  Myoview in 2011 with no ischemia or infarction.  Echo (10/12) with EF 55-60%, mild LVH, mild MR.  Lexiscan Cardiolite in 2013 with no ischemia or infarction.  Echo (8/14) with EF 55-60%, moderate MR, moderate TR, PA systolic pressure 35 mmHg.  Echo (4/15) with EF 60-65%, mild focal basal septal hypertrophy, inferior basal akinesis, mild MR.  Lexiscan cardiolite (11/15) with EF 61%, fixed basal inferoseptal defect, no ischemia.  - Lexiscan Cardiolite (6/18): EF 54%, no perfusion defect.  - NSTEMI 3/20, cath showed 95% ostial/proximal LAD, 60-70% mid LAD, totally occluded SVG-LCx, totally occluded RCA, 80-90% mid SVG-RCA.  Patient had DES to ostial-mid LAD and DES to SVG-RCA.  2. Raynauds syndrome 3. Post-polio syndrome 4. GERD with dilation of esophageal stricture in 12/12.  5. Hyperlipidemia 6. H/o CVAs: Brainstem stroke with Horner's syndrome.  Recurrent stroke in 10/20 with right hand weakness when off anticoagulation for 2 days.  - Carotid dopplers (10/20) with minimal stenosis.  7. Scoliosis. 8. H/o appendectomy 9. Herpes Zoster 10. Atrial fibrillation/atypical atrial flutter: DCCV to NSR in  8/14. DCCV to NSR in 1/15. Atrial fibrillation ablation 6/15 with Tikosyn loading (at [facility]).   - Redo atrial fibrillation ablation in 6/20.  Patient also noted to have complex flutter with several foci, not ablated.  - Zio patch (7/20): Primarily NSR with a few short SVT runs.  - DCCV 11/20.  - DCCV 7/21 11. PFTs (4/15) with FVC 59%, FEV1 54%, ratio 91%, DLCO 53% => moderate obstructive defect thought to be related to COPD and severe restrictive defect thought to be due to elevated left hemidiaphragm and post-polio syndrome.  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  Echo (5/18) with EF 65-70%, RV moderately dilated with normal systolic function, PASP 57 mmHg.  - Echo (3/20): EF 45-50%, mild LV dilation, basal inferolateral and inferior hypokinesis.  - Echo (10/20): EF 50-55%, mild LVH, basal inferior and  inferoseptal hypokinesis, moderately decreased RV systolic function, small PFO.  13. CKD: Stage 3.  14. OSA: dental appliance.  15. Right radial artery pseudoaneurysm: post-cath in 3/20, s/p repair.  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      albuterol (PROAIR HFA) 108 (90 BASE) MCG/ACT inhaler Inhale 2 puffs into the lungs every 4 (four) hours as needed for wheezing or shortness of breath (and prior to exercise). 1 Inhaler 3   AMBULATORY NON FORMULARY MEDICATION Apply 150 mg topically daily. Medication Name: Testosterone (AT) 150/0(150mg ) cpd      dicyclomine (BENTYL) 10 MG capsule Take 10 mg by mouth 3 (three) times daily as needed for spasms.     diltiazem (CARDIZEM CD) 240 MG 24 hr capsule Take 1 capsule (240 mg total) by mouth daily. 30 capsule 6   dofetilide (TIKOSYN) 250 MCG capsule Take 250 mcg by mouth 2 (two) times daily.     ELIQUIS 2.5 MG TABS tablet TAKE 1 TABLET BY MOUTH TWICE A DAY 180 tablet 1    ezetimibe (ZETIA) 10 MG tablet TAKE 1 TABLET BY MOUTH EVERY DAY 90 tablet 3   famotidine (PEPCID) 40 MG tablet Take 1 tablet (40 mg total) by mouth at bedtime. 90 tablet 3   furosemide (LASIX) 20 MG tablet Take 20 mg by mouth daily. Take extra tab as needed     KLOR-CON M20 20 MEQ tablet TAKE 1 TABLET BY MOUTH AT BEDTIME (Patient taking differently: Take 20 mEq by mouth at bedtime. ) 90 tablet 3   lactobacillus acidophilus & bulgar (LACTINEX) chewable tablet Chew 2 tablets by mouth daily.      loperamide (IMODIUM) 2 MG capsule Take 2-4 mg by mouth as needed for diarrhea or loose stools.      LORazepam (ATIVAN) 0.5 MG tablet Take 0.5 mg by mouth at bedtime.      magnesium oxide (MAG-OX) 400 MG tablet Take 400 mg by mouth every evening.      methocarbamol (ROBAXIN) 500 MG tablet Take 500 mg by mouth 3 (three) times daily as needed for muscle spasms.      nitroGLYCERIN (NITROSTAT) 0.4 MG SL tablet Place 1 tablet (0.4 mg total) under the tongue every 5 (five) minutes as needed for chest pain. 25 tablet 0   Probiotic Product (ALIGN) 4 MG CAPS Take 4 mg by mouth every evening.      rosuvastatin (CRESTOR) 40 MG tablet TAKE 1 TABLET BY MOUTH EVERY DAY 90 tablet 3   traZODone (DESYREL) 50 MG tablet Take 50 mg by mouth at bedtime.     Vitamin D, Ergocalciferol, (DRISDOL) 50000 UNITS CAPS capsule Take 50,000 Units by mouth every 14 (fourteen) days. Take on 1st and 15th     No current facility-administered medications for this encounter.    Allergies:   Morphine and related and Tape   Social History:  The patient  reports that he quit smoking about 53 years ago. His smoking use included cigarettes. He has a 2.50 pack-year smoking history. He quit smokeless tobacco use about 71 years ago. He reports that he does not drink alcohol and does not use drugs.   Family History:  The patient's family history includes Cancer in his son; Heart Problems in his brother; Heart disease in his father,  maternal grandfather, maternal grandmother, mother, paternal grandfather,  and paternal grandmother; Prostate cancer in his paternal uncle.   ROS:  Please see the history of present illness.   All other systems are personally reviewed and negative.   Exam:   BP (!) 158/60    Pulse (!) 115    Wt 56.4 kg (124 lb 6 oz)    SpO2 95%    BMI 20.07 kg/m  General: NAD Neck: No JVD, no thyromegaly or thyroid nodule.  Lungs: Clear to auscultation bilaterally with normal respiratory effort. CV: Nondisplaced PMI.  Heart tachy, regular S1/S2, no S3/S4, no murmur.  No peripheral edema.  No carotid bruit.  Normal pedal pulses.  Abdomen: Soft, nontender, no hepatosplenomegaly, no distention.  Skin: Intact without lesions or rashes.  Neurologic: Alert and oriented x 3.  Psych: Normal affect. Extremities: No clubbing or cyanosis.  HEENT: Normal.    Recent Labs: 10/21/2019: ALT 21; TSH 1.123 04/10/2020: Magnesium 2.4 07/01/2020: Platelets 178 07/10/2020: Hemoglobin 13.6 08/08/2020: BUN 15; Creatinine, Ser 1.40; Potassium 4.0; Sodium 137  Personally reviewed   Wt Readings from Last 3 Encounters:  08/08/20 56.4 kg (124 lb 6 oz)  07/10/20 56.2 kg (124 lb)  07/01/20 57.7 kg (127 lb 3.2 oz)      ASSESSMENT AND PLAN:  1. Atrial fibrillation/flutter: He has been on Tikosyn and is s/p atrial fibrillation ablation at [facility] on 06/25/14.  He has also had atypical atrial flutter. He had a redo atrial fibrillation ablation in 6/20.  Complex flutter was noted from several foci and was not ablated.  Zio patch from 7/20 showed predominantly NSR with short runs SVT.  DCCV in 11/20, DCCV again in 7/21.  Currently, he seems to go in and out of atypical flutter frequently.  He is more symptomatic with exertional fatigue when in atypical flutter. - Continue Tikosyn.  QTc today is acceptable.  I will check BMET and Mg.  - Continue apixaban, this is appropriately dosed at 2.5 mg bid with age and low weight.   - He is now off  nebivolol (tolerated poorly) and on diltiazem for rate control.  HR is elevated in flutter today, will increase diltiazem CD to 240 mg daily.    2. CAD: H/o CABG.  He had NSTEMI in 3/20 with DES to ostial/proximal LAD and SVG-RCA.  No further chest pain.  - He is now off plavix.   - He will continue apixaban long-term for atrial fibrillation/flutter.  - he is on statin and Zetia.  3. Hyperlipidemia: He is taking Crestor 40 mg daily and Zetia 10 mg daily.  Check LDL today.     4. Ischemic cardiomyopathy/primarily diastolic CHF: Echo in 4/78 with EF 45-50%, echo in 10/20 with EF 50-55%.  NYHA class II symptoms.  He is not volume overloaded on exam, NYHA class II-III.   - Continue Lasix 20 mg daily, check BMET.   - He is off losartan with elevated creatinine.  5. Pulmonary: PFTs suggested both a restrictive and obstructive defect.  The restrictive defect is likely from post-polio syndrome and elevated left hemidiaphragm.  Mr Loftus never smoked much, so the obstructive defect is more surprising.  6. OSA: Severe, likely potentiates his atrial fibrillation. He has had a hard time tolerating CPAP.   - Now using an oral appliance.   7. CVA: 10/20, in setting of holding anticoagulation for 2 days.  Avoid holding anticoagulation in the future.  Mild residual effect on right hand dexterity.   Followup with atrial fibrillation clinic in 2 wks and me  in 2 months.    Signed, [name], MD  08/10/2020  [place] [address] [facility] [place] [address] [phone] (office) [phone] (fax)

## 2020-08-21 NOTE — Progress Notes (Signed)
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 The patient was initially diagnosed with atrial fibrillation several years ago and is s/p afib ablation 06/25/14 at [facility] by Dr. Lehman Prom and has been maintained on Tikosyn since. He has had some possible a fib, atrial flutter vs ectopic atrial rhythm. On 12/07/18, he was noted to be in an ectopic atrial rhythm with 2:1 block and was cardioverted on 12/11/18. On review of his ECGs, it appears he had some transient AV dissociation post cardioversion but is back in SR on follow up. He has been asymptomatic. He denies bleeding issues with anticoagulation. He has OSA and struggles with using his CPAP. He is now on an oral device and follows with Dr Radford Pax and Dr Toy Cookey.  Patient is s/p repeat afib ablation 06/08/19 with Dr Curt Bears. Patient admitted 10/24-10/26/20 with acute CVA after holding anticoagulation for oral surgery. He is on Eliquis for a CHADS2VASC score of 6. Patient is s/p DCCV 11/20/19. Patient was back in atrial flutter at his visit with Dr Curt Bears on 06/10/20. His BB was stopped at that point 2/2 side effects of fatigue. Patient reports he feels like he has more energy off the BB however, his heart rate had been elevated around 120-130 at home.   On follow up today, patient is s/p DCCV on 07/10/20. He feels that he has been in and out of rhythm since that time. He saw Dr Aundra Dubin on 08/08/20 and was in rapid flutter. His diltiazem was increased. He is in rate controlled flutter today but reports that he has heart rates >100 bpm most of the time, highest has been 145. He is unaware of his arrhythmia unless his heart rates become very elevated.   Today,  he denies symptoms of palpitations, SOB, chest pain, orthopnea, PND, lower extremity edema, presyncope, syncope, bleeding, or neurologic sequela. The patient is tolerating medications without difficulties and is otherwise without complaint today.    Atrial Fibrillation Risk Factors:  he does have symptoms or diagnosis of sleep apnea. he is compliant with OSA therapy. Now on oral appliance.    he has a BMI of Body mass index is 20.01 kg/m.[name] Filed Weights   08/22/20 0903  Weight: 56.2 kg     Atrial Fibrillation Management history:  Previous antiarrhythmic drugs: Tikosyn (avoid amio given abn PFTs) Previous cardioversions: 12/11/18, 11/20/19, 07/10/20 Previous ablations: 2015 at [facility], 06/08/19 CHADS2VASC score: 6 (age, HTN, CAD, CVA) Anticoagulation history: Xarelto, Eliquis   Past Medical History:  Diagnosis Date   Abnormal nuclear cardiac imaging test March 2011   Has positive EKG response, no perfusion defect and normal EF   Adenomatous colon polyp 12/2002   Arrhythmia    Atrial fibrillation ([place])    a. s/p rfca;  b. chronic tikosyn and xarelto.   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 04/2017 Echo: EF 65-70%, restrictive physiology, mildly dil LA.   COPD (chronic obstructive pulmonary disease) (HCC)    Coronary artery disease    First MI in 1988. PCI in 1996 with subsequent CABG in 1996  due to restenosis. S/P stents to LCX in 2009. Noted to have residual disease in the LAD in a diffuse manner and atretic LIMA graft. He is managed medically.    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   History of Raynaud's  syndrome    Hyperlipidemia    Hypertension    Internal hemorrhoids    Muscle spasm    Myocardial infarction (HCC)    MI x1 1989 - 34 age   Neuromuscular disorder ([place])    Polio - age 51   OSA (obstructive sleep apnea) 10/16/2018   Severe OSA with AHI 37.2/hr on BiPAP   Persistent atrial fibrillation ([employer])    Polio    age 79   PVC (premature ventricular contraction)    Scoliosis    mild   Past Surgical History:  Procedure Laterality Date   ABLATION     done for a fib   APPENDECTOMY     Age 84   ATRIAL FIBRILLATION ABLATION N/A 06/08/2019   Procedure: ATRIAL FIBRILLATION ABLATION;  Surgeon: Constance Haw, MD;  Location: [place] CV [facility];  Service: Cardiovascular;  Laterality: N/A;   CARDIAC CATHETERIZATION     CARDIOVERSION N/A 08/22/2013   Procedure: CARDIOVERSION;  Surgeon: Carlena Bjornstad, MD;  Location: [place];  Service: Cardiovascular;  Laterality: N/A;   CARDIOVERSION N/A 01/16/2014   Procedure: CARDIOVERSION;  Surgeon: Lelon Perla, MD;  Location: [facility] ENDOSCOPY;  Service: Cardiovascular;  Laterality: N/A;   CARDIOVERSION N/A 12/11/2018   Procedure: CARDIOVERSION;  Surgeon: Larey Dresser, MD;  Location: [facility] ENDOSCOPY;  Service: Cardiovascular;  Laterality: N/A;   CARDIOVERSION N/A 11/20/2019   Procedure: CARDIOVERSION;  Surgeon: [name], MD;  Location: [facility] ENDOSCOPY;  Service: Endoscopy;  Laterality: N/A;   CARDIOVERSION N/A 07/10/2020   Procedure: CARDIOVERSION;  Surgeon: Larey Dresser, MD;  Location: [facility] ENDOSCOPY;  Service: Cardiovascular;  Laterality: N/A;   COLONOSCOPY  2008   CORONARY ARTERY BYPASS GRAFT  1996   with a LIMA to the LAD, SVG to RCA and SVG to OM   CORONARY STENT INTERVENTION N/A 03/26/2019   Procedure: CORONARY STENT INTERVENTION;  Surgeon: Jettie Booze, MD;  Location: [place] [facility];  Service: Cardiovascular;  Laterality: N/A;   CORONARY STENT PLACEMENT  1996   LCX   EMBOLECTOMY  Right 05/09/2019   Procedure: Embolectomy of right radial artery;  Surgeon: Serafina Mitchell, MD;  Location: [place];  Service: Vascular;  Laterality: Right;   ESOPHAGEAL DILATION     EYE SURGERY     FALSE ANEURYSM REPAIR Right 05/09/2019   Procedure: REPAIR FALSE ANEURYSM RIGHT RADIAL;  Surgeon: Serafina Mitchell, MD;  Location: [place];  Service: Vascular;  Laterality: Right;   LEFT HEART CATH AND CORS/GRAFTS ANGIOGRAPHY N/A 03/26/2019   Procedure: LEFT HEART CATH AND CORS/GRAFTS ANGIOGRAPHY;  Surgeon: Larey Dresser, MD;  Location: [place] [facility];  Service: Cardiovascular;  Laterality: N/A;   POLYPECTOMY     TONSILLECTOMY     UPPER GASTROINTESTINAL ENDOSCOPY     esophegeal dilation    Current Outpatient Medications  Medication Sig Dispense Refill   acetaminophen (TYLENOL) 500 MG tablet Take 500 mg by mouth every 8 (eight) hours as needed for mild  pain or moderate pain.      albuterol (PROAIR HFA) 108 (90 BASE) MCG/ACT inhaler Inhale 2 puffs into the lungs every 4 (four) hours as needed for wheezing or shortness of breath (and prior to exercise). 1 Inhaler 3   AMBULATORY NON FORMULARY MEDICATION Apply 150 mg topically daily. Medication Name: Testosterone (AT) 150/0(150mg ) cpd      dicyclomine (BENTYL) 10 MG capsule Take 10 mg by mouth 3 (three) times daily as needed for spasms.     diltiazem (CARDIZEM CD) 240 MG 24 hr capsule Take 1 capsule (240 mg total) by mouth daily. 30 capsule 6   dofetilide (TIKOSYN) 250 MCG capsule Take 250 mcg by mouth 2 (two) times daily.     ELIQUIS 2.5 MG TABS tablet TAKE 1 TABLET BY MOUTH TWICE A DAY 180 tablet 1   ezetimibe (ZETIA) 10 MG tablet TAKE 1 TABLET BY MOUTH EVERY DAY 90 tablet 3   famotidine (PEPCID) 40 MG tablet Take 1 tablet (40 mg total) by mouth at bedtime. 90 tablet 3   furosemide (LASIX) 20 MG tablet Take 20 mg by mouth daily. Take extra tab as needed     KLOR-CON M20 20 MEQ tablet TAKE 1 TABLET BY MOUTH AT BEDTIME (Patient  taking differently: Take 20 mEq by mouth at bedtime. ) 90 tablet 3   lactobacillus acidophilus & bulgar (LACTINEX) chewable tablet Chew 2 tablets by mouth daily.      loperamide (IMODIUM) 2 MG capsule Take 2-4 mg by mouth as needed for diarrhea or loose stools.      LORazepam (ATIVAN) 0.5 MG tablet Take 0.5 mg by mouth at bedtime.      magnesium oxide (MAG-OX) 400 MG tablet Take 400 mg by mouth every evening.      methocarbamol (ROBAXIN) 500 MG tablet Take 500 mg by mouth 3 (three) times daily as needed for muscle spasms.      nitroGLYCERIN (NITROSTAT) 0.4 MG SL tablet Place 1 tablet (0.4 mg total) under the tongue every 5 (five) minutes as needed for chest pain. 25 tablet 0   Probiotic Product (ALIGN) 4 MG CAPS Take 4 mg by mouth every evening.      rosuvastatin (CRESTOR) 40 MG tablet TAKE 1 TABLET BY MOUTH EVERY DAY 90 tablet 3   traZODone (DESYREL) 50 MG tablet Take 50 mg by mouth at bedtime.     Vitamin D, Ergocalciferol, (DRISDOL) 50000 UNITS CAPS capsule Take 50,000 Units by mouth every 14 (fourteen) days. Take on 1st and 15th     diltiazem (CARDIZEM CD) 120 MG 24 hr capsule Take 1 capsule (120 mg total) by mouth at bedtime. 30 capsule 3   No current facility-administered medications for this encounter.    Allergies  Allergen Reactions   Morphine And Related Other (See Comments)    IV forms- vein irritation   Tape Other (See Comments)    Skin Irritation    Social History   Socioeconomic History   Marital status: Married    Spouse name: Inez Catalina   Number of children: 2   Years of education: Not on file   Highest education level: Not on file  Occupational History   Occupation: Pharmacist  Tobacco Use   Smoking status: Former Smoker    Packs/day: 0.50    Years: 5.00    Pack years: 2.50    Types: Cigarettes    Quit date: 12/27/1966    Years since quitting: 53.6   Smokeless tobacco: Former Leisure centre manager  date: 11  Vaping Use   Vaping Use: Never used   Substance and Sexual Activity   Alcohol use: No   Drug use: No   Sexual activity: Yes  Other Topics Concern   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   Social Determinants of Health   Financial Resource Strain: Low Risk    Difficulty of Paying Living Expenses: Not hard at all  Food Insecurity: No Food Insecurity   Worried About Charity fundraiser in the Last Year: Never true   [place] in the Last Year: Never true  Transportation Needs: No Transportation Needs   Lack of Transportation (Medical): No   Lack of Transportation (Non-Medical): No  Physical Activity: Inactive   Days of Exercise per Week: 0 days   Minutes of Exercise per Session: 0 min  Stress: Stress Concern Present   Feeling of Stress : To some extent  Social Connections:    Frequency of Communication with Friends and Family: Not on file   Frequency of Social Gatherings with Friends and Family: Not on file   Attends Religious Services: Not on Electrical engineer or Organizations: Not on file   Attends Archivist Meetings: Not on file   Marital Status: Not on file  Intimate Partner Violence:    Fear of Current or Ex-Partner: Not on file   Emotionally Abused: Not on file   Physically Abused: Not on file   Sexually Abused: Not on file    Family History  Problem Relation Age of Onset   Heart disease Mother    Heart disease Father    Heart Problems Brother        heart transplant   Heart disease Maternal Grandmother    Heart disease Maternal Grandfather    Heart disease Paternal Grandmother    Heart disease Paternal Grandfather    Prostate cancer Paternal Uncle    Cancer Son        thymic cancer.  died at age 61   Colon cancer Neg Hx    Esophageal cancer Neg Hx    Pancreatic cancer Neg Hx    Rectal cancer Neg Hx    Stomach cancer Neg Hx     ROS- All  systems are reviewed and negative except as per the HPI above.  Physical Exam: Vitals:   08/22/20 0903  BP: (!) 150/58  Pulse: 90  Weight: 56.2 kg  Height: 5\' 6"  (1.676 m)    GEN- The patient is well appearing elderly male, alert and oriented x 3 today.   HEENT-head normocephalic, atraumatic, sclera clear, conjunctiva pink, hearing intact, trachea midline. Lungs- Clear to ausculation bilaterally, normal work of breathing Heart- irregular rate and rhythm, no murmurs, rubs or gallops  GI- soft, NT, ND, + BS Extremities- no clubbing, cyanosis, or edema MS- no significant deformity or atrophy Skin- no rash or lesion Psych- euthymic mood, full affect Neuro- strength and sensation are intact   Wt Readings from Last 3 Encounters:  08/22/20 56.2 kg  08/08/20 56.4 kg  07/10/20 56.2 kg    EKG today demonstrates afib vs atypical atrial flutter HR 90, QRS 102, QTc 506  Echo 03/26/19 demonstrated   1. The left ventricle has mildly reduced systolic function, with an ejection fraction of 45-50%. The cavity size was mildly dilated. Left ventricular diastolic Doppler parameters are indeterminate.  2. Posterior lateral and inferior basal hypokinesis.  3. The right ventricle has normal systolic function. The cavity was normal. There is no increase in right ventricular wall thickness.  4. Left atrial size was mildly dilated.  5. Right atrial size was mildly dilated.  6. The mitral valve is degenerative. Moderate thickening of the mitral valve leaflet. Mild calcification of the mitral valve leaflet. There is mild mitral annular calcification present.  7. Tricuspid valve regurgitation is mild-moderate.  8. The aortic valve is tricuspid. Moderate thickening of the aortic valve. Moderate sclerosis of the aortic valve.  Epic records reviewed.   Assessment and Plan:  1. Persistent atrial fibrillation/atypical atrial flutter S/p repeat afib ablation with Dr Curt Bears 06/08/19. Per procedure report,  flutter unable to be ablated due to complexity of atrial flutter. S/p DCCV on 07/10/20. We has a long discussion about therapeutic options. He feels his still has periods of SR so will continue dofetilide for now. Will increase diltiazem for better rate control. If he proves difficult to rate control, could consider AV node ablation.  Increase diltiazem to 240 mg AM and 120 mg PM. Recall he did not tolerate BB 2/2 side effects in the past.  Continue Eliquis 2.5 mg BID  Continue dofetilide 250 mcg BID. Recent bmet reviewed.  This patients CHA2DS2-VASc Score and unadjusted Ischemic Stroke Rate (% per year) is equal to 9.7 % stroke rate/year from a score of 6  Above score calculated as 1 point each if present [CHF, HTN, DM, Vascular=MI/PAD/Aortic Plaque, Age if 65-74, or Male] Above score calculated as 2 points each if present [Age > 75, or Stroke/TIA/TE]  2. Obstructive sleep apnea Unable to tolerate facemask.  Patient compliant with oral device.  3. CAD/Ischemic CM No anginal symptoms.  4. HTN Stable, med changes as above.    Follow up with Dr Curt Bears as scheduled.    [facility] [address] [place], [place] [address] [phone]

## 2020-08-22 ENCOUNTER — Ambulatory Visit (HOSPITAL_COMMUNITY)
Admission: RE | Admit: 2020-08-22 | Discharge: 2020-08-22 | Disposition: A | Discharge status: Home / Self Care | Payer: Medicare Other | Source: Ambulatory Visit | Attending: Physician Assistant | Admitting: Physician Assistant

## 2020-08-22 ENCOUNTER — Other Ambulatory Visit: Payer: Self-pay

## 2020-08-22 VITALS — BP 150/58 | HR 90 | Ht 66.0 in | Wt 124.0 lb

## 2020-08-22 DIAGNOSIS — K219 Gastro-esophageal reflux disease without esophagitis: Secondary | ICD-10-CM | POA: Diagnosis not present

## 2020-08-22 DIAGNOSIS — I5032 Chronic diastolic (congestive) heart failure: Secondary | ICD-10-CM | POA: Insufficient documentation

## 2020-08-22 DIAGNOSIS — I110 Hypertensive heart disease with heart failure: Secondary | ICD-10-CM | POA: Diagnosis not present

## 2020-08-22 DIAGNOSIS — I2510 Atherosclerotic heart disease of native coronary artery without angina pectoris: Secondary | ICD-10-CM | POA: Diagnosis not present

## 2020-08-22 DIAGNOSIS — Z87891 Personal history of nicotine dependence: Secondary | ICD-10-CM | POA: Insufficient documentation

## 2020-08-22 DIAGNOSIS — J449 Chronic obstructive pulmonary disease, unspecified: Secondary | ICD-10-CM | POA: Insufficient documentation

## 2020-08-22 DIAGNOSIS — I083 Combined rheumatic disorders of mitral, aortic and tricuspid valves: Secondary | ICD-10-CM | POA: Diagnosis not present

## 2020-08-22 DIAGNOSIS — I4892 Unspecified atrial flutter: Secondary | ICD-10-CM | POA: Diagnosis not present

## 2020-08-22 DIAGNOSIS — G4733 Obstructive sleep apnea (adult) (pediatric): Secondary | ICD-10-CM | POA: Diagnosis not present

## 2020-08-22 DIAGNOSIS — Z7901 Long term (current) use of anticoagulants: Secondary | ICD-10-CM | POA: Diagnosis not present

## 2020-08-22 DIAGNOSIS — D6869 Other thrombophilia: Secondary | ICD-10-CM

## 2020-08-22 DIAGNOSIS — I252 Old myocardial infarction: Secondary | ICD-10-CM | POA: Diagnosis not present

## 2020-08-22 DIAGNOSIS — Z955 Presence of coronary angioplasty implant and graft: Secondary | ICD-10-CM | POA: Insufficient documentation

## 2020-08-22 DIAGNOSIS — E785 Hyperlipidemia, unspecified: Secondary | ICD-10-CM | POA: Diagnosis not present

## 2020-08-22 DIAGNOSIS — Z951 Presence of aortocoronary bypass graft: Secondary | ICD-10-CM | POA: Insufficient documentation

## 2020-08-22 DIAGNOSIS — I4819 Other persistent atrial fibrillation: Secondary | ICD-10-CM | POA: Insufficient documentation

## 2020-08-22 DIAGNOSIS — I255 Ischemic cardiomyopathy: Secondary | ICD-10-CM | POA: Diagnosis not present

## 2020-08-22 DIAGNOSIS — Z8673 Personal history of transient ischemic attack (TIA), and cerebral infarction without residual deficits: Secondary | ICD-10-CM | POA: Insufficient documentation

## 2020-08-22 DIAGNOSIS — Z79899 Other long term (current) drug therapy: Secondary | ICD-10-CM | POA: Diagnosis not present

## 2020-08-22 MED ORDER — DILTIAZEM HCL ER COATED BEADS 120 MG PO CP24
120.0000 mg | ORAL_CAPSULE | Freq: Every day | ORAL | 3 refills | Status: DC
Start: 2020-08-22 — End: 2020-09-15

## 2020-08-22 NOTE — Patient Instructions (Signed)
Add cardizem 120mg  once a day at bedtime

## 2020-09-10 DIAGNOSIS — H26491 Other secondary cataract, right eye: Secondary | ICD-10-CM | POA: Diagnosis not present

## 2020-09-10 DIAGNOSIS — Z961 Presence of intraocular lens: Secondary | ICD-10-CM | POA: Diagnosis not present

## 2020-09-15 ENCOUNTER — Other Ambulatory Visit (HOSPITAL_COMMUNITY): Payer: Self-pay | Admitting: Physician Assistant

## 2020-09-18 ENCOUNTER — Telehealth: Payer: Self-pay | Admitting: Cardiology

## 2020-09-18 NOTE — Telephone Encounter (Signed)
      I went in pt's chart  To see who called him today.

## 2020-09-23 ENCOUNTER — Other Ambulatory Visit: Payer: Self-pay

## 2020-09-23 ENCOUNTER — Encounter: Payer: Self-pay | Admitting: Cardiology

## 2020-09-23 ENCOUNTER — Ambulatory Visit (INDEPENDENT_AMBULATORY_CARE_PROVIDER_SITE_OTHER): Payer: Medicare Other | Admitting: Cardiology

## 2020-09-23 VITALS — BP 142/56 | HR 79 | Ht 66.0 in | Wt 125.0 lb

## 2020-09-23 DIAGNOSIS — Z79899 Other long term (current) drug therapy: Secondary | ICD-10-CM | POA: Diagnosis not present

## 2020-09-23 DIAGNOSIS — I4819 Other persistent atrial fibrillation: Secondary | ICD-10-CM | POA: Diagnosis not present

## 2020-09-23 LAB — MAGNESIUM: Magnesium: 2.4 mg/dL — ABNORMAL HIGH (ref 1.6–2.3)

## 2020-09-23 NOTE — Progress Notes (Signed)
Electrophysiology Office Note   Date:  09/23/2020   ID:  [name], DOB 1936-06-04, MRN [id number]  PCP:  Burnard Bunting, MD  Cardiologist:  Aundra Dubin Primary Electrophysiologist:  Muntaha Vermette Meredith Leeds, MD    No chief complaint on file.    History of Present Illness: [name] is a 84 y.o. male who is being seen today for the evaluation of atrial fibrillation/flutter at the request of Burnard Bunting, MD. Presenting today for electrophysiology evaluation.  He has a history of persistent atrial fibrillation, coronary disease, COPD, CVA, OSA, hypertension, hyperlipidemia.  He was diagnosed with atrial fibrillation several years ago and is status post ablation in 2015 by Dr. Lehman Prom.  He had been on Tikosyn since that time.  On 219 he was in an ectopic atrial rhythm with 2-1 heart block and was cardioverted 12/11/2018.  He has sleep apnea and is on BiPAP.  He was seen by Dr. Radford Pax who found him to be out of rhythm and referred him to atrial fibrillation clinic.  His EKG appeared to be consistent with atypical atrial flutter.  He has a history of atrial flutter ablation 06/08/2019.  Since that time, he has had for more episodes of atrial flutter.  During ablation, he had multiple atrial flutter circuits.  He has weakness and fatigue when in atrial flutter.  He was also previously on Bystolic, but did not tolerate the medication due to fatigue and it was thus stopped.  He did wear a cardiac monitor that showed short episodes of SVT as well.  Today, denies symptoms of palpitations, chest pain, shortness of breath, orthopnea, PND, lower extremity edema, claudication, dizziness, presyncope, syncope, bleeding, or neurologic sequela. The patient is tolerating medications without difficulties.  He continues to have issues with fatigue and shortness of breath.  There are times that he feels well, and times that he is quite fatigued.  He feels that when he is fatigued, his heart rate is quite fast.   He is also been having quite a bit of lower extremity edema.  He is currently taking Lasix.   Past Medical History:  Diagnosis Date  . Abnormal nuclear cardiac imaging test March 2011   Has positive EKG response, no perfusion defect and normal EF  . Adenomatous colon polyp 12/2002  . Arrhythmia   . Atrial fibrillation ([place])    a. s/p rfca;  b. chronic tikosyn and xarelto.  . 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 Cataract    bil cataracts removed  . Chronic diastolic CHF (congestive heart failure) ([place])    a. 04/2017 Echo: EF 65-70%, restrictive physiology, mildly dil LA.  [name] COPD (chronic obstructive pulmonary disease) ([place])   . Coronary artery disease    First MI in 1988. PCI in 1996 with subsequent CABG in 1996 due to restenosis. S/P stents to LCX in 2009. Noted to have residual disease in the LAD in a diffuse manner and atretic LIMA graft. He is managed medically.   . Difficult intubation    06/08/19 update - Intubated with MAC3 with grade IIb view 7.0 tube passed easily  . Dysrhythmia    Afib  . Esophageal stricture   . GERD (gastroesophageal reflux disease)   . 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 History of Raynaud's syndrome   . Hyperlipidemia   . Hypertension   . Internal hemorrhoids   . Muscle spasm   . Myocardial infarction ([place])  MI x1 11 - 79 age  . Neuromuscular disorder ([place])    Polio - age 22  . OSA (obstructive sleep apnea) 10/16/2018   Severe OSA with AHI 37.2/hr on BiPAP  . Persistent atrial fibrillation ([place])   . Polio    age 54  . PVC (premature ventricular contraction)   . Scoliosis    mild   Past Surgical History:  Procedure Laterality Date  . ABLATION     done for a fib  . APPENDECTOMY     Age 24  . ATRIAL FIBRILLATION ABLATION N/A 06/08/2019   Procedure: ATRIAL FIBRILLATION ABLATION;  Surgeon: Constance Haw, MD;  Location: [place] [facility];  Service: Cardiovascular;   Laterality: N/A;  . CARDIAC CATHETERIZATION    . CARDIOVERSION N/A 08/22/2013   Procedure: CARDIOVERSION;  Surgeon: Carlena Bjornstad, MD;  Location: [facility] ENDOSCOPY;  Service: Cardiovascular;  Laterality: N/A;  . CARDIOVERSION N/A 01/16/2014   Procedure: CARDIOVERSION;  Surgeon: Lelon Perla, MD;  Location: [facility] ENDOSCOPY;  Service: Cardiovascular;  Laterality: N/A;  . CARDIOVERSION N/A 12/11/2018   Procedure: CARDIOVERSION;  Surgeon: Larey Dresser, MD;  Location[facility] ENDOSCOPY;  Service: Cardiovascular;  Laterality: N/A;  . CARDIOVERSION N/A 11/20/2019   Procedure: CARDIOVERSION;  Surgeon: [name], MD;  Location: [facility] ENDOSCOPY;  Service: Endoscopy;  Laterality: N/A;  . CARDIOVERSION N/A 07/10/2020   Procedure: CARDIOVERSION;  Surgeon: Larey Dresser, MD;  Location: [facility] ENDOSCOPY;  Service: Cardiovascular;  Laterality: N/A;  . COLONOSCOPY  2008  . CORONARY ARTERY BYPASS GRAFT  1996   with a LIMA to the LAD, SVG to RCA and SVG to OM  . CORONARY STENT INTERVENTION N/A 03/26/2019   Procedure: CORONARY STENT INTERVENTION;  Surgeon: Jettie Booze, MD;  Location: [place] [facility];  Service: Cardiovascular;  Laterality: N/A;  . CORONARY STENT PLACEMENT  1996   LCX  . EMBOLECTOMY Right 05/09/2019   Procedure: Embolectomy of right radial artery;  Surgeon: Serafina Mitchell, MD;  Location: [place];  Service: Vascular;  Laterality: Right;  . ESOPHAGEAL DILATION    . EYE SURGERY    . FALSE ANEURYSM REPAIR Right 05/09/2019   Procedure: REPAIR FALSE ANEURYSM RIGHT RADIAL;  Surgeon: Serafina Mitchell, MD;  Location: [place];  Service: Vascular;  Laterality: Right;  . LEFT HEART CATH AND CORS/GRAFTS ANGIOGRAPHY N/A 03/26/2019   Procedure: LEFT HEART CATH AND CORS/GRAFTS ANGIOGRAPHY;  Surgeon: Larey Dresser, MD;  Location: [place] [facility];  Service: Cardiovascular;  Laterality: N/A;  . POLYPECTOMY    . TONSILLECTOMY    . UPPER GASTROINTESTINAL ENDOSCOPY     esophegeal dilation      Current Outpatient Medications  Medication Sig Dispense Refill  . acetaminophen (TYLENOL) 500 MG tablet Take 500 mg by mouth every 8 (eight) hours as needed for mild pain or moderate pain.     [name] albuterol (PROAIR HFA) 108 (90 BASE) MCG/ACT inhaler Inhale 2 puffs into the lungs every 4 (four) hours as needed for wheezing or shortness of breath (and prior to exercise). 1 Inhaler 3  . AMBULATORY NON FORMULARY MEDICATION Apply 150 mg topically daily. Medication Name: Testosterone (AT) 150/0(150mg ) cpd     . dicyclomine (BENTYL) 10 MG capsule Take 10 mg by mouth 3 (three) times daily as needed for spasms.    [name] diltiazem (CARDIZEM CD) 120 MG 24 hr capsule TAKE 1 CAPSULE BY MOUTH EVERY DAY 90 capsule 1  . diltiazem (CARDIZEM CD) 240 MG 24 hr  capsule Take 1 capsule (240 mg total) by mouth daily. 30 capsule 6  . dofetilide (TIKOSYN) 250 MCG capsule Take 250 mcg by mouth 2 (two) times daily.    [name] ELIQUIS 2.5 MG TABS tablet TAKE 1 TABLET BY MOUTH TWICE A DAY 180 tablet 1  . ezetimibe (ZETIA) 10 MG tablet TAKE 1 TABLET BY MOUTH EVERY DAY 90 tablet 3  . famotidine (PEPCID) 40 MG tablet Take 1 tablet (40 mg total) by mouth at bedtime. 90 tablet 3  . furosemide (LASIX) 20 MG tablet Take 20 mg by mouth daily. Take extra tab as needed    . KLOR-CON M20 20 MEQ tablet TAKE 1 TABLET BY MOUTH AT BEDTIME (Patient taking differently: Take 20 mEq by mouth at bedtime. ) 90 tablet 3  . lactobacillus acidophilus & bulgar (LACTINEX) chewable tablet Chew 2 tablets by mouth daily.     [name] loperamide (IMODIUM) 2 MG capsule Take 2-4 mg by mouth as needed for diarrhea or loose stools.     [name] LORazepam (ATIVAN) 0.5 MG tablet Take 0.5 mg by mouth at bedtime.     . magnesium oxide (MAG-OX) 400 MG tablet Take 400 mg by mouth every evening.     . methocarbamol (ROBAXIN) 500 MG tablet Take 500 mg by mouth 3 (three) times daily as needed for muscle spasms.     . nitroGLYCERIN (NITROSTAT) 0.4 MG SL tablet Place 1 tablet (0.4 mg  total) under the tongue every 5 (five) minutes as needed for chest pain. 25 tablet 0  . Probiotic Product (ALIGN) 4 MG CAPS Take 4 mg by mouth every evening.     . rosuvastatin (CRESTOR) 40 MG tablet TAKE 1 TABLET BY MOUTH EVERY DAY 90 tablet 3  . traZODone (DESYREL) 50 MG tablet Take 50 mg by mouth at bedtime.    . Vitamin D, Ergocalciferol, (DRISDOL) 50000 UNITS CAPS capsule Take 50,000 Units by mouth every 14 (fourteen) days. Take on 1st and 15th     No current facility-administered medications for this visit.    Allergies:   Morphine and related and Tape   Social History:  The patient  reports that he quit smoking about 53 years ago. His smoking use included cigarettes. He has a 2.50 pack-year smoking history. He quit smokeless tobacco use about 71 years ago. He reports that he does not drink alcohol and does not use drugs.   Family History:  The patient's family history includes Cancer in his son; Heart Problems in his brother; Heart disease in his father, maternal grandfather, maternal grandmother, mother, paternal grandfather, and paternal grandmother; Prostate cancer in his paternal uncle.   ROS:  Please see the history of present illness.   Otherwise, review of systems is positive for none.   All other systems are reviewed and negative.   PHYSICAL EXAM: VS:  BP (!) 142/56   Pulse 79   Ht 5\' 6"  (1.676 m)   Wt 125 lb (56.7 kg)   SpO2 94%   BMI 20.18 kg/m  , BMI Body mass index is 20.18 kg/m. GEN: Well nourished, well developed, in no acute distress  HEENT: normal  Neck: no JVD, carotid bruits, or masses Cardiac: RRR; no murmurs, rubs, or gallops,no edema  Respiratory:  clear to auscultation bilaterally, normal work of breathing GI: soft, nontender, nondistended, + BS MS: no deformity or atrophy  Skin: warm and dry Neuro:  Strength and sensation are intact Psych: euthymic mood, full affect  EKG:  EKG is ordered today.  Personal review of the ekg ordered shows sinus rhythm,  Mobitz 1 AV block, rate 79  Recent Labs: 10/21/2019: ALT 21; TSH 1.123 04/10/2020: Magnesium 2.4 07/01/2020: Platelets 178 07/10/2020: Hemoglobin 13.6 08/08/2020: BUN 15; Creatinine, Ser 1.40; Potassium 4.0; Sodium 137    Lipid Panel     Component Value Date/Time   CHOL 131 08/08/2020 1338   TRIG 50 08/08/2020 1338   HDL 52 08/08/2020 1338   CHOLHDL 2.5 08/08/2020 1338   VLDL 10 08/08/2020 1338   LDLCALC 69 08/08/2020 1338     Wt Readings from Last 3 Encounters:  09/23/20 125 lb (56.7 kg)  08/22/20 124 lb (56.2 kg)  08/08/20 124 lb 6 oz (56.4 kg)      Other studies Reviewed: Additional studies/ records that were reviewed today include: TTE 12/29/18  Review of the above records today demonstrates:  - Left ventricle: The cavity size was normal. Systolic function was   normal. The estimated ejection fraction was in the range of 55%   to 60%. Hypokinesis of the basalanteroseptal and inferoseptal   myocardium. The study is not technically sufficient to allow   evaluation of LV diastolic function. - Aortic valve: Valve mobility was restricted. Transvalvular   velocity was within the normal range. There was no stenosis.   There was no regurgitation. Mean gradient (S): 8 mm Hg. Valve   area (VTI): 1.28 cm^2. Valve area (Vmax): 1.28 cm^2. Valve area   (Vmean): 1.26 cm^2. - Mitral valve: Transvalvular velocity was within the normal range.   There was no evidence for stenosis. There was mild regurgitation. - Left atrium: The atrium was moderately dilated. - Right ventricle: The cavity size was normal. Wall thickness was   normal. Systolic function was normal. - Tricuspid valve: There was mild regurgitation. - Pulmonary arteries: Systolic pressure was within the normal   range. PA peak pressure: 35 mm Hg (S).   ASSESSMENT AND PLAN:  1.  Persistent atrial fibrillation/atypical atrial flutter: Currently on Xarelto, dofetilide (ECG monitoring for high risk medication), diltiazem.   Status post ablation 06/08/2019 with multiple atrial flutter circuits.  CHA2DS2-VASc of 6.  Unfortunately he is continued to have issues with atypical atrial flutter.  He is on Tikosyn, but this does not appear to be controlling his overall symptomatology.  I have discussed with him the possibility of switching to either amiodarone or Multaq.  He does not wish to start amiodarone.  He is also had issues in the past with abnormal PFTs and obstructive lung disease.  I have also offered him repeat ablation attempt to see if he has a dominant atrial flutter.  He Geralene Afshar think about this and let us know.  2.  Obstructive sleep apnea: Cannot tolerate a facemask.  Has been referred for an oral appliance.  Compliance encouraged.  3.  Coronary artery disease: Status post CABG.  No current chest pain.  4.  Hypertension: Mildly elevated today but usually well controlled. No changes.  Case discussed with primary cardiology  Current medicines are reviewed at length with the patient today.   The patient does not have concerns regarding his medicines.  The following changes were made today: None  Labs/ tests ordered today include:  Orders Placed This Encounter  Procedures  . Magnesium  . EKG 12-Lead     Disposition:   FU with Wendel Homeyer 3 months  Signed, Saara Kijowski Meredith Leeds, MD  09/23/2020 12:00 PM     [address]  [address] [phone] (office) [phone] (fax)

## 2020-09-23 NOTE — Patient Instructions (Addendum)
Medication Instructions:  Your physician recommends that you continue on your current medications as directed. Please refer to the Current Medication list given to you today.  *If you need a refill on your cardiac medications before your next appointment, please call your pharmacy*   Lab Work: None ordered   Testing/Procedures: None ordered   Follow-Up: At [facility], you and your health needs are our priority.  As part of our continuing mission to provide you with exceptional heart care, we have created designated Provider Care Teams.  These Care Teams include your primary Cardiologist (physician) and Advanced Practice Providers (APPs -  Physician Assistants and Nurse Practitioners) who all work together to provide you with the care you need, when you need it.  Your next appointment:   3 month(s)  The format for your next appointment:   In Person  Provider:   Allegra Lai, MD    Thank you for choosing [facility]!!   Trinidad Curet, RN [phone]   Other Instructions  Dronedarone tablets What is this medicine? DRONEDARONE (droe NE da rone) is an antiarrhythmic drug. It helps make your heart beat regularly. This medicine may be used for other purposes; ask your health care provider or pharmacist if you have questions. COMMON BRAND NAME(S): Multaq What should I tell my health care provider before I take this medicine? They need to know if you have any of these conditions:  heart failure  history of irregular heartbeat  liver disease  liver or lung problems with the past use of amiodarone  low levels of magnesium in the blood  low levels of potassium in the blood  other heart disease  an unusual or allergic reaction to dronedarone, other medicines, foods, dyes, or preservatives  pregnant or trying to get pregnant  breast-feeding How should I use this medicine? Take this medicine by mouth with a glass of water. Follow the directions on the  prescription label. Take one tablet with the morning meal and one tablet with the evening meal. Do not take your medicine more often than directed. Do not stop taking except on the advice of your doctor or health care professional. A special MedGuide will be given to you by the pharmacist with each prescription and refill. Be sure to read this information carefully each time. Talk to your pediatrician regarding the use of this medicine in children. Special care may be needed. Overdosage: If you think you have taken too much of this medicine contact a poison control center or emergency room at once. NOTE: This medicine is only for you. Do not share this medicine with others. What if I miss a dose? If you miss a dose, take it as soon as you can. If it is almost time for your next dose, take only that dose. Do not take double or extra doses. What may interact with this medicine? Do not take this medicine with any of the following medications:  arsenic trioxide  certain antibiotics like clarithromycin, erythromycin, pentamidine, telithromycin, troleandomycin  certain medicines for depression like tricyclic antidepressants  certain medicines for fungal infections like fluconazole, itraconazole, ketoconazole, posaconazole, voriconazole  certain medicines for irregular heart beat like amiodarone, disopyramide, flecainide, ibutilide, quinidine, propafenone, sotalol  certain medicines for malaria like chloroquine, halofantrine  cisapride  cyclosporine  droperidol  haloperidol  methadone  other medicines that prolong the QT interval (cause an abnormal heart rhythm)  pimozide  nefazodone  phenothiazines like chlorpromazine, mesoridazine, prochlorperazine, thioridazine  ritonavir  ziprasidone This medicine may also  interact with the following medications:  certain medicines for blood pressure, heart disease, or irregular heart beat like diltiazem, metoprolol, propranolol,  verapamil  certain medicines for cholesterol like atorvastatin, lovastatin, simvastatin  certain medicines for seizures like carbamazepine, phenobarbital, phenytoin  digoxin  dofetilide  grapefruit juice  rifampin  sirolimus  St. John's Wort  tacrolimus This list may not describe all possible interactions. Give your health care provider a list of all the medicines, herbs, non-prescription drugs, or dietary supplements you use. Also tell them if you smoke, drink alcohol, or use illegal drugs. Some items may interact with your medicine. What should I watch for while using this medicine? Your condition will be monitored closely when you first begin therapy. Often, this drug is first started in a hospital or other monitored health care setting. Once you are on maintenance therapy, visit your doctor or health care professional for regular checks on your progress. Because your condition and use of this medicine carry some risk, it is a good idea to carry an identification card, necklace or bracelet with details of your condition, medications, and doctor or health care professional. Dennis Bast may get drowsy or dizzy. Do not drive, use machinery, or do anything that needs mental alertness until you know how this medicine affects you. Do not stand or sit up quickly, especially if you are an older patient. This reduces the risk of dizzy or fainting spells. What side effects may I notice from receiving this medicine? Side effects that you should report to your doctor or health care professional as soon as possible:  allergic reactions like skin rash, itching or hives, swelling of the face, lips, or tongue  breathing problems  cough  dark urine  fast, irregular heartbeat  general ill feeling or flu-like symptoms  light-colored stools  loss of appetite, nausea  right upper belly pain  slow heartbeat  stomach pain  swelling of the legs or ankles  unusually weak or tired  weight  gain  yellowing of the eyes or skin Side effects that usually do not require medical attention (report to your doctor or health care professional if they continue or are bothersome):  nausea  vomiting  stomach pain This list may not describe all possible side effects. Call your doctor for medical advice about side effects. You may report side effects to FDA at 1-800-FDA-1088. Where should I keep my medicine? Keep out of the reach of children. Store at room temperature between 15 and 30 degrees C (59 and 86 degrees F). Throw away any unused medicine after the expiration date. NOTE: This sheet is a summary. It may not cover all possible information. If you have questions about this medicine, talk to your doctor, pharmacist, or health care provider.  2020 Elsevier/Gold Standard (2018-12-04 10:43:10)     Cardiac Ablation Cardiac ablation is a procedure to disable (ablate) a small amount of heart tissue in very specific places. The heart has many electrical connections. Sometimes these connections are abnormal and can cause the heart to beat very fast or irregularly. Ablating some of the problem areas can improve the heart rhythm or return it to normal. Ablation may be done for people who: Have Wolff-Parkinson-White syndrome. Have fast heart rhythms (tachycardia). Have taken medicines for an abnormal heart rhythm (arrhythmia) that were not effective or caused side effects. Have a high-risk heartbeat that may be life-threatening. During the procedure, a small incision is made in the neck or the groin, and a long, thin, flexible tube (catheter) is  inserted into the incision and moved to the heart. Small devices (electrodes) on the tip of the catheter will send out electrical currents. A type of X-ray (fluoroscopy) will be used to help guide the catheter and to provide images of the heart. Tell a health care provider about: Any allergies you have. All medicines you are taking, including  vitamins, herbs, eye drops, creams, and over-the-counter medicines. Any problems you or family members have had with anesthetic medicines. Any blood disorders you have. Any surgeries you have had. Any medical conditions you have, such as kidney failure. Whether you are pregnant or may be pregnant. What are the risks? Generally, this is a safe procedure. However, problems may occur, including: Infection. Bruising and bleeding at the catheter insertion site. Bleeding into the chest, especially into the sac that surrounds the heart. This is a serious complication. Stroke or blood clots. Damage to other structures or organs. Allergic reaction to medicines or dyes. Need for a permanent pacemaker if the normal electrical system is damaged. A pacemaker is a small computer that sends electrical signals to the heart and helps your heart beat normally. The procedure not being fully effective. This may not be recognized until months later. Repeat ablation procedures are sometimes required. What happens before the procedure? Follow instructions from your health care provider about eating or drinking restrictions. Ask your health care provider about: Changing or stopping your regular medicines. This is especially important if you are taking diabetes medicines or blood thinners. Taking medicines such as aspirin and ibuprofen. These medicines can thin your blood. Do not take these medicines before your procedure if your health care provider instructs you not to. Plan to have someone take you home from the hospital or clinic. If you will be going home right after the procedure, plan to have someone with you for 24 hours. What happens during the procedure? To lower your risk of infection: Your health care team will wash or sanitize their hands. Your skin will be washed with soap. Hair may be removed from the incision area. An IV tube will be inserted into one of your veins. You will be given a medicine  to help you relax (sedative). The skin on your neck or groin will be numbed. An incision will be made in your neck or your groin. A needle will be inserted through the incision and into a large vein in your neck or groin. A catheter will be inserted into the needle and moved to your heart. Dye may be injected through the catheter to help your surgeon see the area of the heart that needs treatment. Electrical currents will be sent from the catheter to ablate heart tissue in desired areas. There are three types of energy that may be used to ablate heart tissue: Heat (radiofrequency energy). Laser energy. Extreme cold (cryoablation). When the necessary tissue has been ablated, the catheter will be removed. Pressure will be held on the catheter insertion area to prevent excessive bleeding. A bandage (dressing) will be placed over the catheter insertion area. The procedure may vary among health care providers and hospitals. What happens after the procedure? Your blood pressure, heart rate, breathing rate, and blood oxygen level will be monitored until the medicines you were given have worn off. Your catheter insertion area will be monitored for bleeding. You will need to lie still for a few hours to ensure that you do not bleed from the catheter insertion area. Do not drive for 24 hours or as long as  directed by your health care provider. Summary Cardiac ablation is a procedure to disable (ablate) a small amount of heart tissue in very specific places. Ablating some of the problem areas can improve the heart rhythm or return it to normal. During the procedure, electrical currents will be sent from the catheter to ablate heart tissue in desired areas. This information is not intended to replace advice given to you by your health care provider. Make sure you discuss any questions you have with your health care provider. Document Revised: 06/05/2018 Document Reviewed: 11/01/2016 Elsevier Patient  Education  [place].

## 2020-09-25 ENCOUNTER — Telehealth: Payer: Self-pay | Admitting: Cardiology

## 2020-09-25 NOTE — Telephone Encounter (Signed)
Pt called in and stated that he needs his lab result for his last Magnesium faxed to [phone].  Pt did not know how to see results on mychart, I advised them how to use and view mychart results and reset password so they would be able to view.  Pt would like a return call to talk bout the magnesium lab results   [facility] number [phone]

## 2020-09-25 NOTE — Telephone Encounter (Signed)
Aware I will fax request tomorrow when I am back in the office.  They are appreciative. Result reviewed w/ pt and wife. They appreciate my return call.

## 2020-09-26 ENCOUNTER — Telehealth: Payer: Self-pay | Admitting: Cardiology

## 2020-09-26 MED ORDER — DRONEDARONE HCL 400 MG PO TABS: 400.0000 mg | ORAL_TABLET | Freq: Two times a day (BID) | ORAL | 6 refills | Status: DC

## 2020-09-26 NOTE — Telephone Encounter (Signed)
Wife reports pt would like to try Multaq. Advised to stop Tikosyn, start Multaq 400 mg BID tomorrow. Samples left at front desk for wife to pick up today.  He is going to try medication and see how he responds before filling Rx. Advised to call office if SE begin after new medication change. Wife & pt agreeable to plan.

## 2020-09-26 NOTE — Telephone Encounter (Signed)
Pt c/o medication issue:  1. Name of Medication: Multaq   2. How are you currently taking this medication (dosage and times per day)? Pt has not started yet   3. Are you having a reaction (difficulty breathing--STAT)? no  4. What is your medication issue? Pt agrees to start taking this medication. Please send rx to [place], [place]

## 2020-09-26 NOTE — Telephone Encounter (Signed)
Lab faxed as requested.  Confirmation received

## 2020-09-29 DIAGNOSIS — Z23 Encounter for immunization: Secondary | ICD-10-CM | POA: Diagnosis not present

## 2020-10-05 ENCOUNTER — Other Ambulatory Visit (HOSPITAL_COMMUNITY): Payer: Self-pay | Admitting: Cardiology

## 2020-10-08 ENCOUNTER — Encounter (HOSPITAL_COMMUNITY): Payer: Self-pay | Admitting: Cardiology

## 2020-10-08 ENCOUNTER — Other Ambulatory Visit: Payer: Self-pay

## 2020-10-08 ENCOUNTER — Ambulatory Visit (HOSPITAL_COMMUNITY)
Admission: RE | Admit: 2020-10-08 | Discharge: 2020-10-08 | Disposition: A | Discharge status: Home / Self Care | Payer: Medicare Other | Source: Ambulatory Visit | Attending: Cardiology | Admitting: Cardiology

## 2020-10-08 VITALS — BP 150/60 | HR 90 | Ht 66.0 in | Wt 120.4 lb

## 2020-10-08 DIAGNOSIS — B91 Sequelae of poliomyelitis: Secondary | ICD-10-CM | POA: Insufficient documentation

## 2020-10-08 DIAGNOSIS — Z885 Allergy status to narcotic agent status: Secondary | ICD-10-CM | POA: Diagnosis not present

## 2020-10-08 DIAGNOSIS — I255 Ischemic cardiomyopathy: Secondary | ICD-10-CM | POA: Insufficient documentation

## 2020-10-08 DIAGNOSIS — Z951 Presence of aortocoronary bypass graft: Secondary | ICD-10-CM | POA: Diagnosis not present

## 2020-10-08 DIAGNOSIS — Z7901 Long term (current) use of anticoagulants: Secondary | ICD-10-CM | POA: Insufficient documentation

## 2020-10-08 DIAGNOSIS — I69331 Monoplegia of upper limb following cerebral infarction affecting right dominant side: Secondary | ICD-10-CM | POA: Diagnosis not present

## 2020-10-08 DIAGNOSIS — J984 Other disorders of lung: Secondary | ICD-10-CM | POA: Diagnosis not present

## 2020-10-08 DIAGNOSIS — K219 Gastro-esophageal reflux disease without esophagitis: Secondary | ICD-10-CM | POA: Insufficient documentation

## 2020-10-08 DIAGNOSIS — Z79899 Other long term (current) drug therapy: Secondary | ICD-10-CM | POA: Insufficient documentation

## 2020-10-08 DIAGNOSIS — I25810 Atherosclerosis of coronary artery bypass graft(s) without angina pectoris: Secondary | ICD-10-CM | POA: Diagnosis not present

## 2020-10-08 DIAGNOSIS — I484 Atypical atrial flutter: Secondary | ICD-10-CM | POA: Diagnosis not present

## 2020-10-08 DIAGNOSIS — I5032 Chronic diastolic (congestive) heart failure: Secondary | ICD-10-CM | POA: Insufficient documentation

## 2020-10-08 DIAGNOSIS — Z888 Allergy status to other drugs, medicaments and biological substances status: Secondary | ICD-10-CM | POA: Diagnosis not present

## 2020-10-08 DIAGNOSIS — E785 Hyperlipidemia, unspecified: Secondary | ICD-10-CM | POA: Diagnosis not present

## 2020-10-08 DIAGNOSIS — N183 Chronic kidney disease, stage 3 unspecified: Secondary | ICD-10-CM | POA: Diagnosis not present

## 2020-10-08 DIAGNOSIS — G4733 Obstructive sleep apnea (adult) (pediatric): Secondary | ICD-10-CM | POA: Diagnosis not present

## 2020-10-08 DIAGNOSIS — Z955 Presence of coronary angioplasty implant and graft: Secondary | ICD-10-CM | POA: Diagnosis not present

## 2020-10-08 DIAGNOSIS — I4891 Unspecified atrial fibrillation: Secondary | ICD-10-CM | POA: Diagnosis not present

## 2020-10-08 LAB — BASIC METABOLIC PANEL
Anion gap: 10 (ref 5–15)
BUN: 16 mg/dL (ref 8–23)
CO2: 26 mmol/L (ref 22–32)
Calcium: 10.4 mg/dL — ABNORMAL HIGH (ref 8.9–10.3)
Chloride: 103 mmol/L (ref 98–111)
Creatinine, Ser: 1.47 mg/dL — ABNORMAL HIGH (ref 0.61–1.24)
GFR, Estimated: 44 mL/min — ABNORMAL LOW (ref >60)
Glucose, Bld: 96 mg/dL (ref 70–99)
Potassium: 4.2 mmol/L (ref 3.5–5.1)
Sodium: 139 mmol/L (ref 135–145)

## 2020-10-08 LAB — CBC
HCT: 41.9 % (ref 39.0–52.0)
Hemoglobin: 12.9 g/dL — ABNORMAL LOW (ref 13.0–17.0)
MCH: 27.4 pg (ref 26.0–34.0)
MCHC: 30.8 g/dL (ref 30.0–36.0)
MCV: 89 fL (ref 80.0–100.0)
Platelets: 227 10*3/uL (ref 150–400)
RBC: 4.71 MIL/uL (ref 4.22–5.81)
RDW: 17.6 % — ABNORMAL HIGH (ref 11.5–15.5)
WBC: 6.1 10*3/uL (ref 4.0–10.5)
nRBC: 0 % (ref 0.0–0.2)

## 2020-10-08 LAB — MAGNESIUM: Magnesium: 2.3 mg/dL (ref 1.7–2.4)

## 2020-10-08 NOTE — Progress Notes (Signed)
ID:  [name], DOB 07/13/1936, MRN [id number]    Provider location: [facility] Type of Visit: Established patient   PCP:  Burnard Bunting, MD  Cardiologist:  [name]   History of Present Illness: [name] is a 84 y.o. male who has history of CAD s/p CABG.  Cath in 11/09 showed that the SVG-distal RCA was patent, the CFX system was patent.  His LIMA was atretic and there were serial 60% and 80% stenoses in the native LAD.  He was managed medically.  Echo in 8/14 showed EF 55-60% with moderate MR and moderate TR.   He was initially noted to have atrial fibrillation in the summer of 2014. He was started on Xarelto and cardioverted to NSR in 8/14.  Recurrent atrial fibrillation was noted in 1/15, and he was cardioverted to NSR again.  This time, NSR did not hold long. By 5/15, he was in persistent atrial fibrillation.  I referred him to [name] where he had atrial fibrillation ablation and Tikosyn loading.  Ranolazine was stopped due to risk of QT prolongation and Imdur was started as an anti-anginal instead.  Lexiscan Cardiolite in 11/15 showed no ischemia.    CTA chest done in 2/16 to look for evidence for PV stenosis post-AF ablation.  This showed mild short-segment narrowing of the left inferior pulmonary vein.   Patient was admitted in 5/18 with acute on chronic diastolic CHF.  He had been at the beach for [place] days and ate out a number of times, probably getting a significant sodium load.  No chest pain.  He was in normal sinus rhythm.  He was started on IV Lasix and diuresed.  Echo in 5/18 showed EF 65-70% with moderately dilated RV and normal systolic function, PASP 57 mmHg.  Lexiscan Cardiolite in 6/18 showed no significant perfusion defect.   He was admitted with NSTEMI in 3/20, had DES to ostial LAD and mid SVG-RCA.  Echo showed EF 45-50%.  He was in atypical atrial flutter persistently despite Tikosyn.  After his intervention, he was noted to  have a radial artery pseudoaneurysm that was repaired by Dr. Trula Slade.   In 6/20, he had a redo atrial fibrillation ablation.  He was also noted to have complex flutter from multiple foci that was not ablated.  After the procedure, he has had considerable pain in the right leg at the cath site but also radiating down the leg, groin US showed no AV fistula or pseudoaneurysm.    In 10/20, patient held anticoagulation for 2 days for oral surgery and had a CVA presenting as right hand weakness.  He did not get tPA.  Echo in 10/20 showed EF 50-55%, mild LVH, moderately decreased RV function, small PFO. Carotid dopplers showed 1-39% bilateral stenosis. He was switched from Xarelto to Eliquis.   In 11/20, he went into persistent atrial fibrillation and underwent DCCV.  In 7/21, he went into atypical flutter and had DCCV to NSR.    He returns for followup of CHF, atrial fibrillation, and CAD.  He remains in atrial flutter today.  No dyspnea walking on flat ground but he does get short of breath walking up hills/inclines. He has poor energy/stamina generally.  No chest pain.  No orthopnea/PND.  Weight is down 4 lbs.  BP is high today, but systolic BP generally is around 130 when he checks at home.   ECG (personally reviewed): atypical flutter, rate 86  Labs (8/12):  K 4.1, creatinine 1.2, LDL 91, HDL 53 Labs (8/14): K 4.6, creatinine 1.1 Labs (11/14): K 4.6, creatinine 1.2, LDL 91, HDL 56 Labs (3/15): AST 38, ALT 32, TSH normal, BNP 237 Labs (7/15): LDL 96, HDL 48, K 4.2, creatinine 1.2, TSH normal, BNP 237, AST 38, ALT 32 Labs (8/15): LFTs normal Labs (11/15): K 4.2, creatinine 1.1, Mg 2.2, BNP 227 Labs (2/16): K 4, creatinine 1.32, BNP 261 Labs (6/18): K 3.9, creatinine 1.6 Labs (7/18): K 3.9, creatinine 1.5, BNP 242 Labs (12/18): K 3.8, creatinine 1.4, hgb 13.9, LDL 73, HDL 52 Labs (3/19): K 3.7, creatinine 1.4, LDL 76, HDL 42 Labs (6/19): K 4.1, creatinine 1.63 Labs (3/20): K 4.2, creatinine 1.29,  LDL 53, HDL 47, hgb 13.8 Labs (4/20): K 4.3, creatinine 1.5 Labs (6/20): K 4.2, creatinine 1.84, hgb 12.4 Labs (7/20): K 4.5, creatinine 2.53 => 1.72 Labs (8/20): K 4.7, creatinine 1.5, Mg 2.4 Labs (12/20): LDL 79, K 4.9, creatinine 1.4 Labs (2/21): K 4.5, creatinine 1.56 Labs (8/21): K 4, creatinine 1.4, LDL 69, HDL 52  PMH: 1. CAD: 1st MI in 1988.  CABG 1996.  PCI to CFX in 2009.  LHC (11/09) SVG-dRCA patent, total occlusion RCA, patent CFX stent, atretic LIMA, serial 60 and 80% proximal LAD stenoses, EF 60% with basal inferior hypokinesis.  Myoview in 2011 with no ischemia or infarction.  Echo (10/12) with EF 55-60%, mild LVH, mild MR.  Lexiscan Cardiolite in 2013 with no ischemia or infarction.  Echo (8/14) with EF 55-60%, moderate MR, moderate TR, PA systolic pressure 35 mmHg.  Echo (4/15) with EF 60-65%, mild focal basal septal hypertrophy, inferior basal akinesis, mild MR.  Lexiscan cardiolite (11/15) with EF 61%, fixed basal inferoseptal defect, no ischemia.  - Lexiscan Cardiolite (6/18): EF 54%, no perfusion defect.  - NSTEMI 3/20, cath showed 95% ostial/proximal LAD, 60-70% mid LAD, totally occluded SVG-LCx, totally occluded RCA, 80-90% mid SVG-RCA.  Patient had DES to ostial-mid LAD and DES to SVG-RCA.  2. Raynauds syndrome 3. Post-polio syndrome 4. GERD with dilation of esophageal stricture in 12/12.  5. Hyperlipidemia 6. H/o CVAs: Brainstem stroke with Horner's syndrome.  Recurrent stroke in 10/20 with right hand weakness when off anticoagulation for 2 days.  - Carotid dopplers (10/20) with minimal stenosis.  7. Scoliosis. 8. H/o appendectomy 9. Herpes Zoster 10. Atrial fibrillation/atypical atrial flutter: DCCV to NSR in 8/14. DCCV to NSR in 1/15. Atrial fibrillation ablation 6/15 with Tikosyn loading (at [facility]).   - Redo atrial fibrillation ablation in 6/20.  Patient also noted to have complex flutter with several foci, not ablated.  - Zio patch (7/20): Primarily NSR with a few  short SVT runs.  - DCCV 11/20.  - DCCV 7/21 11. PFTs (4/15) with FVC 59%, FEV1 54%, ratio 91%, DLCO 53% => moderate obstructive defect thought to be related to COPD and severe restrictive defect thought to be due to elevated left hemidiaphragm and post-polio syndrome.  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  Echo (5/18) with EF 65-70%, RV moderately dilated with normal systolic function, PASP 57 mmHg.  - Echo (3/20): EF 45-50%, mild LV dilation, basal inferolateral and inferior hypokinesis.  - Echo (10/20): EF 50-55%, mild LVH, basal inferior and inferoseptal hypokinesis, moderately decreased RV systolic function, small PFO.  13. CKD: Stage 3.  14. OSA: dental appliance.  15. Right radial artery pseudoaneurysm: post-cath in 3/20, s/p repair.  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 acetaminophen (TYLENOL) 500 MG  tablet Take 500 mg by mouth every 8 (eight) hours as needed for mild pain or moderate pain.     [name] albuterol (PROAIR HFA) 108 (90 BASE) MCG/ACT inhaler Inhale 2 puffs into the lungs every 4 (four) hours as needed for wheezing or shortness of breath (and prior to exercise). 1 Inhaler 3  . AMBULATORY NON FORMULARY MEDICATION Apply 150 mg topically daily. Medication Name: Testosterone (AT) 150/0(150mg ) cpd     . dicyclomine (BENTYL) 10 MG capsule Take 10 mg by mouth 3 (three) times daily as needed for spasms.    [name] diltiazem (CARDIZEM CD) 120 MG 24 hr capsule TAKE 1 CAPSULE BY MOUTH EVERY DAY 90 capsule 1  . diltiazem (CARDIZEM CD) 240 MG 24 hr capsule Take 1 capsule (240 mg total) by mouth daily. 30 capsule 6  . dofetilide (TIKOSYN) 250 MCG capsule Take 250 mcg by mouth 2 (two) times daily.    [name] ELIQUIS 2.5 MG TABS tablet TAKE 1 TABLET BY MOUTH TWICE A DAY 180 tablet 1  . ezetimibe (ZETIA) 10 MG tablet TAKE 1 TABLET BY MOUTH EVERY DAY 90 tablet 3  . famotidine (PEPCID) 40 MG tablet Take 1 tablet (40 mg total) by mouth at bedtime. 90 tablet 3   . furosemide (LASIX) 20 MG tablet Take 20 mg by mouth daily. Take extra tab as needed    . KLOR-CON M20 20 MEQ tablet TAKE 1 TABLET BY MOUTH AT BEDTIME (Patient taking differently: Take 20 mEq by mouth at bedtime. ) 90 tablet 3  . lactobacillus acidophilus & bulgar (LACTINEX) chewable tablet Chew 2 tablets by mouth daily.     [name] loperamide (IMODIUM) 2 MG capsule Take 2-4 mg by mouth as needed for diarrhea or loose stools.     [name] LORazepam (ATIVAN) 0.5 MG tablet Take 0.5 mg by mouth at bedtime.     . methocarbamol (ROBAXIN) 500 MG tablet Take 500 mg by mouth 3 (three) times daily as needed for muscle spasms.     . nitroGLYCERIN (NITROSTAT) 0.4 MG SL tablet Place 1 tablet (0.4 mg total) under the tongue every 5 (five) minutes as needed for chest pain. 25 tablet 0  . Probiotic Product (ALIGN) 4 MG CAPS Take 4 mg by mouth every evening.     . rosuvastatin (CRESTOR) 40 MG tablet TAKE 1 TABLET BY MOUTH EVERY DAY 90 tablet 3  . traZODone (DESYREL) 50 MG tablet Take 50 mg by mouth at bedtime.    . Vitamin D, Ergocalciferol, (DRISDOL) 50000 UNITS CAPS capsule Take 50,000 Units by mouth every 14 (fourteen) days. Take on 1st and 15th    . dronedarone (MULTAQ) 400 MG tablet Take 1 tablet (400 mg total) by mouth 2 (two) times daily with a meal. (Patient not taking: Reported on 10/08/2020) 60 tablet 6   No current facility-administered medications for this encounter.    Allergies:   Morphine and related and Tape   Social History:  The patient  reports that he quit smoking about 53 years ago. His smoking use included cigarettes. He has a 2.50 pack-year smoking history. He quit smokeless tobacco use about 71 years ago. He reports that he does not drink alcohol and does not use drugs.   Family History:  The patient's family history includes Cancer in his son; Heart Problems in his brother; Heart disease in his father, maternal grandfather, maternal grandmother, mother, paternal grandfather, and paternal  grandmother; Prostate cancer in his paternal uncle.   ROS:  Please see the history  of present illness.   All other systems are personally reviewed and negative.   Exam:   BP (!) 150/60 (BP Location: Left Arm, Patient Position: Sitting, Cuff Size: Normal)   Pulse 90   Ht 5\' 6"  (1.676 m)   Wt 54.6 kg (120 lb 6.4 oz)   SpO2 95%   BMI 19.43 kg/m  General: NAD Neck: No JVD, no thyromegaly or thyroid nodule.  Lungs: Decreased BS left base.  CV: Nondisplaced PMI.  Heart irregular S1/S2, no S3/S4, no murmur.  No peripheral edema.  No carotid bruit.  Normal pedal pulses.  Abdomen: Soft, nontender, no hepatosplenomegaly, no distention.  Skin: Intact without lesions or rashes.  Neurologic: Alert and oriented x 3.  Psych: Normal affect. Extremities: No clubbing or cyanosis.  HEENT: Normal.     Recent Labs: 10/21/2019: ALT 21; TSH 1.123 10/08/2020: BUN 16; Creatinine, Ser 1.47; Hemoglobin 12.9; Magnesium 2.3; Platelets 227; Potassium 4.2; Sodium 139  Personally reviewed   Wt Readings from Last 3 Encounters:  10/08/20 54.6 kg (120 lb 6.4 oz)  09/23/20 56.7 kg (125 lb)  08/22/20 56.2 kg (124 lb)      ASSESSMENT AND PLAN:  1. Atrial fibrillation/flutter: He has been on Tikosyn and is s/p atrial fibrillation ablation at [facility] on 06/25/14.  He has also had atypical atrial flutter. He had a redo atrial fibrillation ablation in 6/20.  Complex flutter was noted from several foci and was not ablated.  Zio patch from 7/20 showed predominantly NSR with short runs SVT.  DCCV in 11/20, DCCV again in 7/21.  He is in atypical atrial flutter today despite Tikosyn, he appears to be in this rhythm chronically now. HR is controlled in 80s today.  - Continue Tikosyn for now.  QTc today is acceptable (452 msec).  I will check BMET and Mg.  He does not want to switch to amiodarone due to side effect risk, and I do not think that multaq would be likely to be effective if Tikosyn is not working.  - Continue apixaban,  this is appropriately dosed at 2.5 mg bid with age and low weight.   - He is now off nebivolol (tolerated poorly) and on diltiazem for rate control.  - He is interested in attempting a flutter ablation though this may be difficult given suspected multiple foci.  Will discuss with Dr. Curt Bears.  2. CAD: H/o CABG.  He had NSTEMI in 3/20 with DES to ostial/proximal LAD and SVG-RCA.  No further chest pain.  - He will continue apixaban long-term for atrial fibrillation/flutter.  - he is on statin and Zetia.  3. Hyperlipidemia: He is taking Crestor 40 mg daily and Zetia 10 mg daily.  Good lipids in 8/21.    4. Ischemic cardiomyopathy/primarily diastolic CHF: Echo in 2/75 with EF 45-50%, echo in 10/20 with EF 50-55%.  NYHA class II-III symptoms.  He is not volume overloaded on exam.  I think that he is symptomatically worse when in atrial flutter.  - He will have repeat echo at followup appointment.   - Continue Lasix 20 mg daily, check BMET.   - He is off losartan with elevated creatinine.  5. Pulmonary: PFTs suggested both a restrictive and obstructive defect.  The restrictive defect is likely from post-polio syndrome and elevated left hemidiaphragm.  Mr Quezada never smoked much, so the obstructive defect is more surprising.  6. OSA: Severe, likely potentiates his atrial fibrillation. He has had a hard time tolerating CPAP.   - Now using  an oral appliance.   7. CVA: 10/20, in setting of holding anticoagulation for 2 days.  Avoid holding anticoagulation in the future.  Mild residual effect on right hand dexterity.   Followup in 3 months with echo.   Signed, [name], MD  10/08/2020  [place] [address] [facility] [place] [address] [phone] (office) [phone] (fax)

## 2020-10-08 NOTE — Patient Instructions (Signed)
Labs done today, your results will be available in MyChart, we will contact you for abnormal readings. ° °Your physician recommends that you schedule a follow-up appointment in: 3 months with echocardiogram ° °If you have any questions or concerns before your next appointment please send us a message through mychart or call our office at [phone].   ° °TO LEAVE A MESSAGE FOR THE NURSE SELECT OPTION 2, PLEASE LEAVE A MESSAGE INCLUDING: °• YOUR NAME °• DATE OF BIRTH °• CALL BACK NUMBER °• REASON FOR CALL**this is important as we prioritize the call backs ° °YOU WILL RECEIVE A CALL BACK THE SAME DAY AS LONG AS YOU CALL BEFORE 4:00 PM ° °At the [facility], you and your health needs are our priority. As part of our continuing mission to provide you with exceptional heart care, we have created designated Provider Care Teams. These Care Teams include your primary Cardiologist (physician) and Advanced Practice Providers (APPs- Physician Assistants and Nurse Practitioners) who all work together to provide you with the care you need, when you need it.  ° °You may see any of the following providers on your designated Care Team at your next follow up: °• Dr Daniel Bensimhon °• Dr Dalton McLean °• Amy Clegg, NP °• Brittainy Simmons, PA °• Lauren Kemp, PharmD ° ° °Please be sure to bring in all your medications bottles to every appointment.  ° ° ° °

## 2020-10-10 ENCOUNTER — Telehealth: Payer: Self-pay | Admitting: Cardiology

## 2020-10-10 NOTE — Telephone Encounter (Signed)
Patient's wife called wanting to know if his appt with Dr. Curt Bears needs to be moved to a later date as he is not seeing [name] until 01/08/21. She also wants to speak to the nurse about the samples of Multaq he was given, he is not taking them after taking to [name].

## 2020-10-13 NOTE — Telephone Encounter (Signed)
Late Entry: I did call and speak w/ pt & wife late Friday afternoon, around 5:30 pm. They will bring unopened samples to next OV.

## 2020-10-14 ENCOUNTER — Encounter: Payer: Self-pay | Admitting: Cardiology

## 2020-10-14 ENCOUNTER — Other Ambulatory Visit: Payer: Self-pay

## 2020-10-14 ENCOUNTER — Telehealth (INDEPENDENT_AMBULATORY_CARE_PROVIDER_SITE_OTHER): Payer: Medicare Other | Admitting: Cardiology

## 2020-10-14 ENCOUNTER — Telehealth (HOSPITAL_COMMUNITY): Payer: Self-pay | Admitting: *Deleted

## 2020-10-14 VITALS — BP 140/54 | HR 91

## 2020-10-14 DIAGNOSIS — I484 Atypical atrial flutter: Secondary | ICD-10-CM | POA: Diagnosis not present

## 2020-10-14 NOTE — Progress Notes (Signed)
Electrophysiology TeleHealth Note   Due to national recommendations of social distancing due to COVID 19, an audio/video telehealth visit is felt to be most appropriate for this patient at this time.  See Epic message for the patient's consent to telehealth for [facility].   Date:  10/14/2020   ID:  [name], DOB 1936/01/27, MRN [id number]  Location: patient's home  Provider location: [address] [place]  Evaluation Performed: Follow-up visit  PCP:  Burnard Bunting, MD  Cardiologist:  Aundra Dubin Electrophysiologist:  Dr Curt Bears  Chief Complaint:  Atrial flutter  History of Present Illness:    [name] is a 84 y.o. male who presents via audio/video conferencing for a telehealth visit today.  Since last being seen in our clinic, the patient reports doing very well.  Today, he denies symptoms of palpitations, chest pain, shortness of breath,  lower extremity edema, dizziness, presyncope, or syncope.  The patient is otherwise without complaint today.  The patient denies symptoms of fevers, chills, cough, or new SOB worrisome for COVID 19.  He has a history of persistent atrial fibrillation, coronary artery disease, COPD, CVA, OSA, hypertension, hyperlipidemia.  He had an AF ablation in 2015 at [facility].  He had a repeat ablation 06/08/2019 for an atypical atrial flutter.  He had multiple atrial flutter circuits, all of which were not terminated.  He is currently on dofetilide.  He has had persistent atrial flutter over the last few months.  He would prefer ablation over medication changes.  Today, denies symptoms of palpitations, chest pain, shortness of breath, orthopnea, PND, lower extremity edema, claudication, dizziness, presyncope, syncope, bleeding, or neurologic sequela. The patient is tolerating medications without difficulties.    Past Medical History:  Diagnosis Date  . Abnormal nuclear cardiac imaging test March 2011   Has positive EKG response, no  perfusion defect and normal EF  . Adenomatous colon polyp 12/2002  . Arrhythmia   . Atrial fibrillation ([place])    a. s/p rfca;  b. chronic tikosyn and xarelto.  . 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 Cataract    bil cataracts removed  . Chronic diastolic CHF (congestive heart failure) ([place])    a. 04/2017 Echo: EF 65-70%, restrictive physiology, mildly dil LA.  [name] COPD (chronic obstructive pulmonary disease) ([place])   . Coronary artery disease    First MI in 1988. PCI in 1996 with subsequent CABG in 1996 due to restenosis. S/P stents to LCX in 2009. Noted to have residual disease in the LAD in a diffuse manner and atretic LIMA graft. He is managed medically.   . Difficult intubation    06/08/19 update - Intubated with MAC3 with grade IIb view 7.0 tube passed easily  . Dysrhythmia    Afib  . Esophageal stricture   . GERD (gastroesophageal reflux disease)   . 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 History of Raynaud's syndrome   . Hyperlipidemia   . Hypertension   . Internal hemorrhoids   . Muscle spasm   . Myocardial infarction [facility])    MI x1 79 - 72 age  . Neuromuscular disorder ([place])    Polio - age 19  . OSA (obstructive sleep apnea) 10/16/2018   Severe OSA with AHI 37.2/hr on BiPAP  . Persistent atrial fibrillation ([place])   . Polio    age 89  . PVC (premature ventricular contraction)   .  Scoliosis    mild    Past Surgical History:  Procedure Laterality Date  . ABLATION     done for a fib  . APPENDECTOMY     Age 57  . ATRIAL FIBRILLATION ABLATION N/A 06/08/2019   Procedure: ATRIAL FIBRILLATION ABLATION;  Surgeon: Constance Haw, MD;  Location: [place] CV LAB;  Service: Cardiovascular;  Laterality: N/A;  . CARDIAC CATHETERIZATION    . CARDIOVERSION N/A 08/22/2013   Procedure: CARDIOVERSION;  Surgeon: Carlena Bjornstad, MD;  Location: [facility] ENDOSCOPY;  Service: Cardiovascular;  Laterality: N/A;  .  CARDIOVERSION N/A 01/16/2014   Procedure: CARDIOVERSION;  Surgeon: Lelon Perla, MD;  Location: [facility] ENDOSCOPY;  Service: Cardiovascular;  Laterality: N/A;  . CARDIOVERSION N/A 12/11/2018   Procedure: CARDIOVERSION;  Surgeon: Larey Dresser, MD;  Location: [facility] ENDOSCOPY;  Service: Cardiovascular;  Laterality: N/A;  . CARDIOVERSION N/A 11/20/2019   Procedure: CARDIOVERSION;  Surgeon: [name], MD;  Location: [facility] ENDOSCOPY;  Service: Endoscopy;  Laterality: N/A;  . CARDIOVERSION N/A 07/10/2020   Procedure: CARDIOVERSION;  Surgeon: Larey Dresser, MD;  Location: [facility] ENDOSCOPY;  Service: Cardiovascular;  Laterality: N/A;  . COLONOSCOPY  2008  . CORONARY ARTERY BYPASS GRAFT  1996   with a LIMA to the LAD, SVG to RCA and SVG to OM  . CORONARY STENT INTERVENTION N/A 03/26/2019   Procedure: CORONARY STENT INTERVENTION;  Surgeon: Jettie Booze, MD;  Location: [place] CV LAB;  Service: Cardiovascular;  Laterality: N/A;  . CORONARY STENT PLACEMENT  1996   LCX  . EMBOLECTOMY Right 05/09/2019   Procedure: Embolectomy of right radial artery;  Surgeon: Serafina Mitchell, MD;  Location: [place];  Service: Vascular;  Laterality: Right;  . ESOPHAGEAL DILATION    . EYE SURGERY    . FALSE ANEURYSM REPAIR Right 05/09/2019   Procedure: REPAIR FALSE ANEURYSM RIGHT RADIAL;  Surgeon: Serafina Mitchell, MD;  Location: [place];  Service: Vascular;  Laterality: Right;  . LEFT HEART CATH AND CORS/GRAFTS ANGIOGRAPHY N/A 03/26/2019   Procedure: LEFT HEART CATH AND CORS/GRAFTS ANGIOGRAPHY;  Surgeon: Larey Dresser, MD;  Location: [place] CV LAB;  Service: Cardiovascular;  Laterality: N/A;  . POLYPECTOMY    . TONSILLECTOMY    . UPPER GASTROINTESTINAL ENDOSCOPY     esophegeal dilation    Current Outpatient Medications  Medication Sig Dispense Refill  . acetaminophen (TYLENOL) 500 MG tablet Take 500 mg by mouth every 8 (eight) hours as needed for mild pain or moderate pain.     [name] albuterol  (PROAIR HFA) 108 (90 BASE) MCG/ACT inhaler Inhale 2 puffs into the lungs every 4 (four) hours as needed for wheezing or shortness of breath (and prior to exercise). 1 Inhaler 3  . AMBULATORY NON FORMULARY MEDICATION Apply 150 mg topically daily. Medication Name: Testosterone (AT) 150/0(150mg ) cpd     . dicyclomine (BENTYL) 10 MG capsule Take 10 mg by mouth 3 (three) times daily as needed for spasms.    [name] diltiazem (CARDIZEM CD) 120 MG 24 hr capsule TAKE 1 CAPSULE BY MOUTH EVERY DAY 90 capsule 1  . diltiazem (CARDIZEM CD) 240 MG 24 hr capsule Take 1 capsule (240 mg total) by mouth daily. 30 capsule 6  . dofetilide (TIKOSYN) 250 MCG capsule Take 250 mcg by mouth 2 (two) times daily.    [name] dronedarone (MULTAQ) 400 MG tablet Take 1 tablet (400 mg total) by mouth 2 (two) times daily with a meal. (Patient not taking: Reported on  10/08/2020) 60 tablet 6  . ELIQUIS 2.5 MG TABS tablet TAKE 1 TABLET BY MOUTH TWICE A DAY 180 tablet 1  . ezetimibe (ZETIA) 10 MG tablet TAKE 1 TABLET BY MOUTH EVERY DAY 90 tablet 3  . famotidine (PEPCID) 40 MG tablet Take 1 tablet (40 mg total) by mouth at bedtime. 90 tablet 3  . furosemide (LASIX) 20 MG tablet Take 20 mg by mouth daily. Take extra tab as needed    . KLOR-CON M20 20 MEQ tablet TAKE 1 TABLET BY MOUTH AT BEDTIME (Patient taking differently: Take 20 mEq by mouth at bedtime. ) 90 tablet 3  . lactobacillus acidophilus & bulgar (LACTINEX) chewable tablet Chew 2 tablets by mouth daily.     [name] loperamide (IMODIUM) 2 MG capsule Take 2-4 mg by mouth as needed for diarrhea or loose stools.     [name] LORazepam (ATIVAN) 0.5 MG tablet Take 0.5 mg by mouth at bedtime.     . methocarbamol (ROBAXIN) 500 MG tablet Take 500 mg by mouth 3 (three) times daily as needed for muscle spasms.     . nitroGLYCERIN (NITROSTAT) 0.4 MG SL tablet Place 1 tablet (0.4 mg total) under the tongue every 5 (five) minutes as needed for chest pain. 25 tablet 0  . Probiotic Product (ALIGN) 4 MG CAPS Take 4 mg  by mouth every evening.     . rosuvastatin (CRESTOR) 40 MG tablet TAKE 1 TABLET BY MOUTH EVERY DAY 90 tablet 3  . traZODone (DESYREL) 50 MG tablet Take 50 mg by mouth at bedtime.    . Vitamin D, Ergocalciferol, (DRISDOL) 50000 UNITS CAPS capsule Take 50,000 Units by mouth every 14 (fourteen) days. Take on 1st and 15th     No current facility-administered medications for this visit.    Allergies:   Morphine and related and Tape   Social History:  The patient  reports that he quit smoking about 53 years ago. His smoking use included cigarettes. He has a 2.50 pack-year smoking history. He quit smokeless tobacco use about 71 years ago. He reports that he does not drink alcohol and does not use drugs.   Family History:  The patient's  family history includes Cancer in his son; Heart Problems in his brother; Heart disease in his father, maternal grandfather, maternal grandmother, mother, paternal grandfather, and paternal grandmother; Prostate cancer in his paternal uncle.   ROS:  Please see the history of present illness.   All other systems are personally reviewed and negative.    Exam:    Vital Signs:  BP (!) 140/54   Pulse 91   SpO2 97%   no acute distress, no shortness of breath.  Labs/Other Tests and Data Reviewed:    Recent Labs: 10/21/2019: ALT 21; TSH 1.123 10/08/2020: BUN 16; Creatinine, Ser 1.47; Hemoglobin 12.9; Magnesium 2.3; Platelets 227; Potassium 4.2; Sodium 139   Wt Readings from Last 3 Encounters:  10/08/20 120 lb 6.4 oz (54.6 kg)  09/23/20 125 lb (56.7 kg)  08/22/20 124 lb (56.2 kg)     Other studies personally reviewed: Additional studies/ records that were reviewed today include: ECG 10/08/2020 personally reviewed Review of the above records today demonstrates: Atrial flutter, rate 86  ASSESSMENT & PLAN:    1.  Persistent atrial fibrillation/atypical atrial flutter: Currently on Xarelto and dofetilide as well as diltiazem.  He has had multiple different  atrial flutter circuits during his ablation on 06/08/2019.  He has continued to have issues with atypical atrial flutter despite dofetilide.  He does not wish to be on any other medications and would prefer a repeat ablation.  Risks and benefits have been discussed risk of bleeding, tamponade, heart block, stroke, damage to chest organs.  He understands [place] and is agreed to the procedure.  2.  Obstructive sleep apnea: Has been referred for an oral appliance.  Compliance encouraged.  Case discussed with primary cardiology COVID 19 screen The patient denies symptoms of COVID 19 at this time.  The importance of social distancing was discussed today.  Follow-up:  3 months  Current medicines are reviewed at length with the patient today.   The patient does not have concerns regarding his medicines.  The following changes were made today:  none  Labs/ tests ordered today include:  No orders of the defined types were placed in this encounter.    Patient Risk:  after full review of this patients clinical status, I feel that they are at moderate risk at this time.  Signed, Kimiyah Blick Meredith Leeds, MD  10/14/2020 4:54 PM     [address] [place] [address] [phone] (office) [phone] (fax)

## 2020-10-14 NOTE — Telephone Encounter (Signed)
Last labs faxed to [phone] as requested.

## 2020-10-19 DIAGNOSIS — Z23 Encounter for immunization: Secondary | ICD-10-CM | POA: Diagnosis not present

## 2020-10-23 ENCOUNTER — Telehealth: Payer: Self-pay | Admitting: Cardiology

## 2020-10-23 NOTE — Telephone Encounter (Signed)
Patient returning phone call, connected call to [facility]

## 2020-10-23 NOTE — Telephone Encounter (Signed)
Left message for patient to call back  

## 2020-10-23 NOTE — Telephone Encounter (Signed)
Pt called and stated that he thought that he was going to get a call the 1st part of this week to sch a CT scan,  Both pt and wife were calling to fu on that?  He stated he talk to Dr Curt Bears last tues and he was going to setup a ct before he had the Ablation    Best number [phone] or [phone]

## 2020-10-23 NOTE — Telephone Encounter (Signed)
Informed patient that Dr. Curt Bears nurse will be in contact with them when she is back in the office. Informed them that once his ablation is scheduled that the nurse will make sure he gets his CT in time and not to stress about it, that they are in good hands. Patient verbalized understanding.

## 2020-10-24 NOTE — Telephone Encounter (Signed)
Spoke w/ pt and wife. Pt agreeable to repeat AFib/Flutter ablation on 11/24. Pt will stop by [facility] office 11/10 for pre procedure blood work. Covid screening scheduled for 11/22. Aware I will type of letter of instructions and leave at front desk for pt pick up by next Tuesday. We will follow up pre testing to verbally review CT & procedure instructions. Wife and pt agreeable to plan.

## 2020-10-28 DIAGNOSIS — Z01812 Encounter for preprocedural laboratory examination: Secondary | ICD-10-CM

## 2020-10-28 DIAGNOSIS — I4819 Other persistent atrial fibrillation: Secondary | ICD-10-CM

## 2020-10-29 ENCOUNTER — Other Ambulatory Visit (HOSPITAL_COMMUNITY): Payer: Self-pay | Admitting: Cardiology

## 2020-11-05 ENCOUNTER — Other Ambulatory Visit: Payer: Medicare Other

## 2020-11-05 ENCOUNTER — Other Ambulatory Visit: Payer: Self-pay

## 2020-11-05 DIAGNOSIS — Z01812 Encounter for preprocedural laboratory examination: Secondary | ICD-10-CM

## 2020-11-05 DIAGNOSIS — I4819 Other persistent atrial fibrillation: Secondary | ICD-10-CM | POA: Diagnosis not present

## 2020-11-05 LAB — CBC
Hematocrit: 35.7 % — ABNORMAL LOW (ref 37.5–51.0)
Hemoglobin: 11.8 g/dL — ABNORMAL LOW (ref 13.0–17.7)
MCH: 27.7 pg (ref 26.6–33.0)
MCHC: 33.1 g/dL (ref 31.5–35.7)
MCV: 84 fL (ref 79–97)
Platelets: 171 10*3/uL (ref 150–450)
RBC: 4.26 x10E6/uL (ref 4.14–5.80)
RDW: 17.8 % — ABNORMAL HIGH (ref 11.6–15.4)
WBC: 6.3 10*3/uL (ref 3.4–10.8)

## 2020-11-05 LAB — BASIC METABOLIC PANEL
BUN/Creatinine Ratio: 14 (ref 10–24)
BUN: 21 mg/dL (ref 8–27)
CO2: 26 mmol/L (ref 20–29)
Calcium: 10.1 mg/dL (ref 8.6–10.2)
Chloride: 102 mmol/L (ref 96–106)
Creatinine, Ser: 1.47 mg/dL — ABNORMAL HIGH (ref 0.76–1.27)
GFR calc Af Amer: 51 mL/min/{1.73_m2} — ABNORMAL LOW (ref >59)
GFR calc non Af Amer: 44 mL/min/{1.73_m2} — ABNORMAL LOW (ref >59)
Glucose: 92 mg/dL (ref 65–99)
Potassium: 4.3 mmol/L (ref 3.5–5.2)
Sodium: 136 mmol/L (ref 134–144)

## 2020-11-10 ENCOUNTER — Telehealth (HOSPITAL_COMMUNITY): Payer: Self-pay | Admitting: Emergency Medicine

## 2020-11-10 ENCOUNTER — Telehealth: Payer: Self-pay | Admitting: Cardiology

## 2020-11-10 NOTE — Telephone Encounter (Signed)
Patient's wife would like to know if the patient needs to have the infusion scheduled prior to his CT. She states they were not told anything about it and are not sure what time he needs to get to the hospital. She states she called the hospital, but they did not know either.

## 2020-11-10 NOTE — Telephone Encounter (Signed)
Pts wife Inez Catalina called to report that she received a call about an infusion the pt needs prior to his CT which is planned for 11/13/20 prior to his CT that is being done for pre-ablation.   She is not sure what this is for... in his appointments... pt is scheduled for IVF with contrast and may be due to his abnormal kidney function.   Will forward to Marchia Bond RN for review.

## 2020-11-10 NOTE — Telephone Encounter (Signed)
Attempted to call patient regarding upcoming cardiac CT appointment and IV hydration appt for renal protection. Left message on voicemail with name and callback number Marchia Bond RN Navigator Cardiac [facility] [phone] Office [phone] Cell

## 2020-11-11 ENCOUNTER — Telehealth (HOSPITAL_COMMUNITY): Payer: Self-pay | Admitting: Emergency Medicine

## 2020-11-11 NOTE — Telephone Encounter (Signed)
Reaching out to patient to offer assistance regarding upcoming cardiac imaging study; pt verbalizes understanding of appt date/time, parking situation and where to check in, pre-test NPO status and medications ordered, and verified current allergies; name and call back number provided for further questions should they arise Marchia Bond RN Navigator Cardiac Imaging [place] [facility] [phone] office [phone] cell  Pt aware of IV hydration appt 11/18 at noon for poor kideny function. Plans to check in at admitting at 11:45a.  Clarise Cruz

## 2020-11-13 ENCOUNTER — Other Ambulatory Visit: Payer: Self-pay

## 2020-11-13 ENCOUNTER — Ambulatory Visit (HOSPITAL_COMMUNITY)
Admission: RE | Admit: 2020-11-13 | Discharge: 2020-11-13 | Disposition: A | Discharge status: Home / Self Care | Payer: Medicare Other | Source: Ambulatory Visit | Attending: Cardiology | Admitting: Cardiology

## 2020-11-13 DIAGNOSIS — I4819 Other persistent atrial fibrillation: Secondary | ICD-10-CM | POA: Insufficient documentation

## 2020-11-13 LAB — BASIC METABOLIC PANEL
Anion gap: 9 (ref 5–15)
BUN: 18 mg/dL (ref 8–23)
CO2: 24 mmol/L (ref 22–32)
Calcium: 10.2 mg/dL (ref 8.9–10.3)
Chloride: 103 mmol/L (ref 98–111)
Creatinine, Ser: 1.47 mg/dL — ABNORMAL HIGH (ref 0.61–1.24)
GFR, Estimated: 47 mL/min — ABNORMAL LOW (ref >60)
Glucose, Bld: 79 mg/dL (ref 70–99)
Potassium: 4.3 mmol/L (ref 3.5–5.1)
Sodium: 136 mmol/L (ref 135–145)

## 2020-11-13 MED ORDER — IOHEXOL 350 MG/ML SOLN
80.0000 mL | Freq: Once | INTRAVENOUS | Status: AC | PRN
  Administered 2020-11-13: 80 mL via INTRAVENOUS

## 2020-11-13 MED ORDER — SODIUM CHLORIDE 0.9 % WEIGHT BASED INFUSION: 1.0000 mL/kg/h | INTRAVENOUS | Status: DC

## 2020-11-13 MED ORDER — SODIUM CHLORIDE 0.9 % WEIGHT BASED INFUSION: 3.0000 mL/kg/h | INTRAVENOUS | Status: DC

## 2020-11-17 ENCOUNTER — Other Ambulatory Visit (HOSPITAL_COMMUNITY)
Admission: RE | Admit: 2020-11-17 | Discharge: 2020-11-17 | Disposition: A | Discharge status: Home / Self Care | Payer: Medicare Other | Source: Ambulatory Visit | Attending: Cardiology | Admitting: Cardiology

## 2020-11-17 DIAGNOSIS — Z20822 Contact with and (suspected) exposure to covid-19: Secondary | ICD-10-CM | POA: Diagnosis not present

## 2020-11-17 DIAGNOSIS — Z01812 Encounter for preprocedural laboratory examination: Secondary | ICD-10-CM | POA: Diagnosis not present

## 2020-11-17 LAB — SARS CORONAVIRUS 2 (TAT 6-24 HRS): SARS Coronavirus 2: NEGATIVE

## 2020-11-18 NOTE — Progress Notes (Signed)
Instructed patient on the following items: Arrival time 0930 Nothing to eat or drink after midnight No meds AM of procedure Responsible person to drive you home and stay with you for 24 hrs  Have you missed any doses of anti-coagulant on Eliquis-hasn't missed any doses

## 2020-11-19 ENCOUNTER — Ambulatory Visit (HOSPITAL_COMMUNITY): Payer: Medicare Other | Admitting: Certified Registered Nurse Anesthetist

## 2020-11-19 ENCOUNTER — Encounter (HOSPITAL_COMMUNITY): Payer: Self-pay | Admitting: Cardiology

## 2020-11-19 ENCOUNTER — Other Ambulatory Visit: Payer: Self-pay

## 2020-11-19 ENCOUNTER — Ambulatory Visit (HOSPITAL_COMMUNITY)
Admission: RE | Admit: 2020-11-19 | Discharge: 2020-11-19 | Disposition: A | Discharge status: Home / Self Care | Payer: Medicare Other | Attending: Cardiology | Admitting: Cardiology

## 2020-11-19 ENCOUNTER — Encounter (HOSPITAL_COMMUNITY)
Admission: RE | Disposition: A | Discharge status: Home / Self Care | Payer: Self-pay | Source: Home / Self Care | Attending: Cardiology

## 2020-11-19 DIAGNOSIS — Z888 Allergy status to other drugs, medicaments and biological substances status: Secondary | ICD-10-CM | POA: Insufficient documentation

## 2020-11-19 DIAGNOSIS — I484 Atypical atrial flutter: Secondary | ICD-10-CM | POA: Diagnosis not present

## 2020-11-19 DIAGNOSIS — K219 Gastro-esophageal reflux disease without esophagitis: Secondary | ICD-10-CM | POA: Diagnosis not present

## 2020-11-19 DIAGNOSIS — Z87891 Personal history of nicotine dependence: Secondary | ICD-10-CM | POA: Insufficient documentation

## 2020-11-19 DIAGNOSIS — G4733 Obstructive sleep apnea (adult) (pediatric): Secondary | ICD-10-CM | POA: Insufficient documentation

## 2020-11-19 DIAGNOSIS — J449 Chronic obstructive pulmonary disease, unspecified: Secondary | ICD-10-CM | POA: Insufficient documentation

## 2020-11-19 DIAGNOSIS — I4819 Other persistent atrial fibrillation: Secondary | ICD-10-CM | POA: Insufficient documentation

## 2020-11-19 DIAGNOSIS — Z885 Allergy status to narcotic agent status: Secondary | ICD-10-CM | POA: Insufficient documentation

## 2020-11-19 DIAGNOSIS — I10 Essential (primary) hypertension: Secondary | ICD-10-CM | POA: Diagnosis not present

## 2020-11-19 DIAGNOSIS — I2510 Atherosclerotic heart disease of native coronary artery without angina pectoris: Secondary | ICD-10-CM | POA: Insufficient documentation

## 2020-11-19 DIAGNOSIS — E785 Hyperlipidemia, unspecified: Secondary | ICD-10-CM | POA: Diagnosis not present

## 2020-11-19 HISTORY — PX: ATRIAL FIBRILLATION ABLATION: EP1191

## 2020-11-19 LAB — POCT ACTIVATED CLOTTING TIME
Activated Clotting Time: 340 seconds
Activated Clotting Time: 340 seconds

## 2020-11-19 SURGERY — ATRIAL FIBRILLATION ABLATION
Anesthesia: General

## 2020-11-19 MED ORDER — ONDANSETRON HCL 4 MG/2ML IJ SOLN: 4.0000 mg | Freq: Four times a day (QID) | INTRAMUSCULAR | Status: DC | PRN

## 2020-11-19 MED ORDER — HEPARIN (PORCINE) IN NACL 1000-0.9 UT/500ML-% IV SOLN
INTRAVENOUS | Status: AC
  Filled 2020-11-19: qty 500

## 2020-11-19 MED ORDER — SUGAMMADEX SODIUM 200 MG/2ML IV SOLN
INTRAVENOUS | Status: DC | PRN
  Administered 2020-11-19: 150 mg via INTRAVENOUS

## 2020-11-19 MED ORDER — PROPOFOL 10 MG/ML IV BOLUS
INTRAVENOUS | Status: DC | PRN
  Administered 2020-11-19: 100 mg via INTRAVENOUS

## 2020-11-19 MED ORDER — ONDANSETRON HCL 4 MG/2ML IJ SOLN
INTRAMUSCULAR | Status: DC | PRN
  Administered 2020-11-19: 4 mg via INTRAVENOUS

## 2020-11-19 MED ORDER — ACETAMINOPHEN 325 MG PO TABS
ORAL_TABLET | ORAL | Status: AC
  Administered 2020-11-19: 650 mg via ORAL
  Filled 2020-11-19: qty 2

## 2020-11-19 MED ORDER — SODIUM CHLORIDE 0.9% FLUSH: 3.0000 mL | Freq: Two times a day (BID) | INTRAVENOUS | Status: DC

## 2020-11-19 MED ORDER — ACETAMINOPHEN 325 MG PO TABS
650.0000 mg | ORAL_TABLET | ORAL | Status: DC | PRN
  Filled 2020-11-19 (×2): qty 2

## 2020-11-19 MED ORDER — ROCURONIUM BROMIDE 10 MG/ML (PF) SYRINGE
PREFILLED_SYRINGE | INTRAVENOUS | Status: DC | PRN
  Administered 2020-11-19: 50 mg via INTRAVENOUS

## 2020-11-19 MED ORDER — PHENYLEPHRINE HCL (PRESSORS) 10 MG/ML IV SOLN
INTRAVENOUS | Status: DC | PRN
  Administered 2020-11-19 (×2): 80 ug via INTRAVENOUS

## 2020-11-19 MED ORDER — PROTAMINE SULFATE 10 MG/ML IV SOLN
INTRAVENOUS | Status: DC | PRN
  Administered 2020-11-19: 20 mg via INTRAVENOUS
  Administered 2020-11-19 (×2): 10 mg via INTRAVENOUS

## 2020-11-19 MED ORDER — SODIUM CHLORIDE 0.9% FLUSH: 3.0000 mL | INTRAVENOUS | Status: DC | PRN

## 2020-11-19 MED ORDER — DOBUTAMINE IN D5W 4-5 MG/ML-% IV SOLN
INTRAVENOUS | Status: DC | PRN
  Administered 2020-11-19: 20 ug/kg/min via INTRAVENOUS

## 2020-11-19 MED ORDER — FENTANYL CITRATE (PF) 100 MCG/2ML IJ SOLN
INTRAMUSCULAR | Status: DC | PRN
  Administered 2020-11-19: 50 ug via INTRAVENOUS

## 2020-11-19 MED ORDER — HEPARIN SODIUM (PORCINE) 1000 UNIT/ML IJ SOLN
INTRAMUSCULAR | Status: AC
  Filled 2020-11-19: qty 2

## 2020-11-19 MED ORDER — LIDOCAINE 2% (20 MG/ML) 5 ML SYRINGE
INTRAMUSCULAR | Status: DC | PRN
  Administered 2020-11-19: 40 mg via INTRAVENOUS

## 2020-11-19 MED ORDER — HEPARIN SODIUM (PORCINE) 1000 UNIT/ML IJ SOLN
INTRAMUSCULAR | Status: DC | PRN
  Administered 2020-11-19: 1000 [IU] via INTRAVENOUS

## 2020-11-19 MED ORDER — HEPARIN SODIUM (PORCINE) 1000 UNIT/ML IJ SOLN
INTRAMUSCULAR | Status: DC | PRN
  Administered 2020-11-19: 1000 [IU] via INTRAVENOUS
  Administered 2020-11-19: 14000 [IU] via INTRAVENOUS

## 2020-11-19 MED ORDER — SODIUM CHLORIDE 0.9 % IV SOLN: INTRAVENOUS | Status: DC

## 2020-11-19 MED ORDER — DOBUTAMINE IN D5W 4-5 MG/ML-% IV SOLN
INTRAVENOUS | Status: AC
  Filled 2020-11-19: qty 250

## 2020-11-19 MED ORDER — FENTANYL CITRATE (PF) 100 MCG/2ML IJ SOLN: 25.0000 ug | INTRAMUSCULAR | Status: DC | PRN

## 2020-11-19 MED ORDER — FENTANYL CITRATE (PF) 100 MCG/2ML IJ SOLN
INTRAMUSCULAR | Status: AC
  Filled 2020-11-19: qty 2

## 2020-11-19 MED ORDER — SODIUM CHLORIDE 0.9 % IV SOLN: 250.0000 mL | INTRAVENOUS | Status: DC | PRN

## 2020-11-19 MED ORDER — HEPARIN (PORCINE) IN NACL 1000-0.9 UT/500ML-% IV SOLN
INTRAVENOUS | Status: DC | PRN
  Administered 2020-11-19 (×5): 500 mL

## 2020-11-19 MED ORDER — DEXAMETHASONE SODIUM PHOSPHATE 10 MG/ML IJ SOLN
INTRAMUSCULAR | Status: DC | PRN
  Administered 2020-11-19: 4 mg via INTRAVENOUS

## 2020-11-19 SURGICAL SUPPLY — 21 items
BAG SNAP BAND KOVER 36X36 (MISCELLANEOUS) ×3 IMPLANT
BLANKET WARM UNDERBOD FULL ACC (MISCELLANEOUS) ×3 IMPLANT
CATH 8FR REPROCESSED SOUNDSTAR (CATHETERS) ×3 IMPLANT
CATH MAPPNG PENTARAY F 2-6-2MM (CATHETERS) ×1 IMPLANT
CATH S CIRCA THERM PROBE 10F (CATHETERS) ×3 IMPLANT
CATH SMTCH THERMOCOOL SF DF (CATHETERS) ×3 IMPLANT
CATH WEB BI DIR CSDF CRV REPRO (CATHETERS) ×3 IMPLANT
CLOSURE PERCLOSE PROSTYLE (VASCULAR PRODUCTS) ×12 IMPLANT
COVER SWIFTLINK CONNECTOR (BAG) ×3 IMPLANT
KIT VERSACROSS STEERABLE D1 (CATHETERS) ×3 IMPLANT
PACK EP LATEX FREE (CUSTOM PROCEDURE TRAY) ×3
PACK EP LF (CUSTOM PROCEDURE TRAY) ×1 IMPLANT
PAD PRO RADIOLUCENT 2001M-C (PAD) ×3 IMPLANT
PATCH CARTO3 (PAD) ×3 IMPLANT
PENTARAY F 2-6-2MM (CATHETERS) ×3
SHEATH CARTO VIZIGO SM CVD (SHEATH) ×3 IMPLANT
SHEATH PINNACLE 7F 10CM (SHEATH) ×3 IMPLANT
SHEATH PINNACLE 8F 10CM (SHEATH) ×6 IMPLANT
SHEATH PINNACLE 9F 10CM (SHEATH) ×3 IMPLANT
SHEATH PROBE COVER 6X72 (BAG) ×3 IMPLANT
TUBING SMART ABLATE COOLFLOW (TUBING) ×3 IMPLANT

## 2020-11-19 NOTE — Anesthesia Postprocedure Evaluation (Signed)
Anesthesia Post Note  Patient: [name]  Procedure(s) Performed: ATRIAL FIBRILLATION ABLATION (N/A )     Patient location during evaluation: PACU Anesthesia Type: General Level of consciousness: awake and alert Pain management: pain level controlled Vital Signs Assessment: post-procedure vital signs reviewed and stable Respiratory status: spontaneous breathing, nonlabored ventilation, respiratory function stable and patient connected to nasal cannula oxygen Cardiovascular status: blood pressure returned to baseline and stable Postop Assessment: no apparent nausea or vomiting Anesthetic complications: no   No complications documented.  Last Vitals:  Vitals:   11/19/20 1453 11/19/20 1508  BP: (!) 167/63 (!) 191/54  Pulse:  76  Resp:  16  Temp:    SpO2: 96% 91%    Last Pain:  Vitals:   11/19/20 1511  TempSrc:   PainSc: 5                  Lam Mccubbins L Stryder Poitra

## 2020-11-19 NOTE — Progress Notes (Signed)
[name] up in bed for 10 minutes before getting him up, left groin dressing had blood saturation on the 2x2 dressing, dressing was removed, no further oozing noted even after sitting him up. Dr Curt Bears was called, Weiss to wait 30 more minutes, re assess. If no further bleeding Weiss to be discharged. VSS, bilateral groins level 0.

## 2020-11-19 NOTE — Discharge Instructions (Signed)
Post procedure care instructions No driving for 4 days. No lifting over 5 lbs for 1 week. No vigorous or sexual activity for 1 week. You may return to work/your usual activities on 11/26/2020. Keep procedure site clean & dry. If you notice increased pain, swelling, bleeding or pus, call/return!  You may shower after 24 hours, but no soaking in baths/hot tubs/pools for 1 week.     You have an appointment set up with the [facility].  Multiple studies have shown that being followed by a dedicated atrial fibrillation clinic in addition to the standard care you receive from your other physicians improves health. We believe that enrollment in the atrial fibrillation clinic will allow [place] to better care for you.   The phone number to the [facility] is [phone]. The clinic is staffed Monday through Friday from 8:30am to 5pm.  Parking Directions: The clinic is located in the Heart and Vascular Building connected to [facility]. 1)From [address] turn on to [address] and go to the 3rd entrance  (Heart and Vascular entrance) on the right. 2)Look to the right for Heart &Vascular Parking Garage. 3)A code for the entrance is required, for December is 3007.   4)Take the elevators to the 1st floor. Registration is in the room with the glass walls at the end of the hallway.  If you have any trouble parking or locating the clinic, please don't hesitate to call [phone].

## 2020-11-19 NOTE — H&P (Signed)
Electrophysiology Office Note   Date:  11/19/2020   ID:  [name], DOB July 30, 1936, MRN [id number]  PCP:  Burnard Bunting, MD  Cardiologist:  Aundra Dubin Primary Electrophysiologist:  Florice Hindle Meredith Leeds, MD    No chief complaint on file.    History of Present Illness: [name] is a 84 y.o. male who is being seen today for the evaluation of atrial fibrillation/flutter at the request of No ref. provider found. Presenting today for electrophysiology evaluation.  He has a history of persistent atrial fibrillation, coronary disease, COPD, CVA, OSA, hypertension, hyperlipidemia.  He was diagnosed with atrial fibrillation several years ago and is status post ablation in 2015 by Dr. Lehman Prom.  He had been on Tikosyn since that time.  On 219 he was in an ectopic atrial rhythm with 2-1 heart block and was cardioverted 12/11/2018.  He has sleep apnea and is on BiPAP.  He was seen by Dr. Radford Pax who found him to be out of rhythm and referred him to atrial fibrillation clinic.  His EKG appeared to be consistent with atypical atrial flutter.  He has a history of atrial flutter ablation 06/08/2019.  Since that time, he has had for more episodes of atrial flutter.  During ablation, he had multiple atrial flutter circuits.  He has weakness and fatigue when in atrial flutter.  He was also previously on Bystolic, but did not tolerate the medication due to fatigue and it was thus stopped.  He did wear a cardiac monitor that showed short episodes of SVT as well.  Today, denies symptoms of palpitations, chest pain, shortness of breath, orthopnea, PND, lower extremity edema, claudication, dizziness, presyncope, syncope, bleeding, or neurologic sequela. The patient is tolerating medications without difficulties. Plan for ablation today.    Past Medical History:  Diagnosis Date  . Abnormal nuclear cardiac imaging test March 2011   Has positive EKG response, no perfusion defect and normal EF  . Adenomatous  colon polyp 12/2002  . Arrhythmia   . Atrial fibrillation ([place])    a. s/p rfca;  b. chronic tikosyn and xarelto.  . 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 Cataract    bil cataracts removed  . Chronic diastolic CHF (congestive heart failure) ([place])    a. 04/2017 Echo: EF 65-70%, restrictive physiology, mildly dil LA.  [name] COPD (chronic obstructive pulmonary disease) ([place])   . Coronary artery disease    First MI in 1988. PCI in 1996 with subsequent CABG in 1996 due to restenosis. S/P stents to LCX in 2009. Noted to have residual disease in the LAD in a diffuse manner and atretic LIMA graft. He is managed medically.   . Difficult intubation    06/08/19 update - Intubated with MAC3 with grade IIb view 7.0 tube passed easily  . Dysrhythmia    Afib  . Esophageal stricture   . GERD (gastroesophageal reflux disease)   . 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 History of Raynaud's syndrome   . Hyperlipidemia   . Hypertension   . Internal hemorrhoids   . Muscle spasm   . Myocardial infarction [facility])    MI x1 16 - 54 age  . Neuromuscular disorder ([place])    Polio - age 25  . OSA (obstructive sleep apnea) 10/16/2018   Severe OSA with AHI 37.2/hr on BiPAP  . Persistent atrial fibrillation ([place])   . Polio    age 32  .  PVC (premature ventricular contraction)   . Scoliosis    mild   Past Surgical History:  Procedure Laterality Date  . ABLATION     done for a fib  . APPENDECTOMY     Age 42  . ATRIAL FIBRILLATION ABLATION N/A 06/08/2019   Procedure: ATRIAL FIBRILLATION ABLATION;  Surgeon: Constance Haw, MD;  Location: [place] CV LAB;  Service: Cardiovascular;  Laterality: N/A;  . CARDIAC CATHETERIZATION    . CARDIOVERSION N/A 08/22/2013   Procedure: CARDIOVERSION;  Surgeon: Carlena Bjornstad, MD;  Location: [facility] ENDOSCOPY;  Service: Cardiovascular;  Laterality: N/A;  . CARDIOVERSION N/A 01/16/2014   Procedure: CARDIOVERSION;   Surgeon: Lelon Perla, MD;  Location: [facility] ENDOSCOPY;  Service: Cardiovascular;  Laterality: N/A;  . CARDIOVERSION N/A 12/11/2018   Procedure: CARDIOVERSION;  Surgeon: Larey Dresser, MD;  Location: [facility] ENDOSCOPY;  Service: Cardiovascular;  Laterality: N/A;  . CARDIOVERSION N/A 11/20/2019   Procedure: CARDIOVERSION;  Surgeon: [name], MD;  Location: [facility] ENDOSCOPY;  Service: Endoscopy;  Laterality: N/A;  . CARDIOVERSION N/A 07/10/2020   Procedure: CARDIOVERSION;  Surgeon: Larey Dresser, MD;  Location: [facility] ENDOSCOPY;  Service: Cardiovascular;  Laterality: N/A;  . COLONOSCOPY  2008  . CORONARY ARTERY BYPASS GRAFT  1996   with a LIMA to the LAD, SVG to RCA and SVG to OM  . CORONARY STENT INTERVENTION N/A 03/26/2019   Procedure: CORONARY STENT INTERVENTION;  Surgeon: Jettie Booze, MD;  Location: [place] CV LAB;  Service: Cardiovascular;  Laterality: N/A;  . CORONARY STENT PLACEMENT  1996   LCX  . EMBOLECTOMY Right 05/09/2019   Procedure: Embolectomy of right radial artery;  Surgeon: Serafina Mitchell, MD;  Location: [place];  Service: Vascular;  Laterality: Right;  . ESOPHAGEAL DILATION    . EYE SURGERY    . FALSE ANEURYSM REPAIR Right 05/09/2019   Procedure: REPAIR FALSE ANEURYSM RIGHT RADIAL;  Surgeon: Serafina Mitchell, MD;  Location: [place];  Service: Vascular;  Laterality: Right;  . LEFT HEART CATH AND CORS/GRAFTS ANGIOGRAPHY N/A 03/26/2019   Procedure: LEFT HEART CATH AND CORS/GRAFTS ANGIOGRAPHY;  Surgeon: Larey Dresser, MD;  Location: [place] CV LAB;  Service: Cardiovascular;  Laterality: N/A;  . POLYPECTOMY    . TONSILLECTOMY    . UPPER GASTROINTESTINAL ENDOSCOPY     esophegeal dilation     Current Facility-Administered Medications  Medication Dose Route Frequency Provider Last Rate Last Admin  . 0.9 %  sodium chloride infusion   Intravenous Continuous Constance Haw, MD 50 mL/hr at 11/19/20 1003 New Bag at 11/19/20 1003    Allergies:   Morphine  and related and Tape   Social History:  The patient  reports that he quit smoking about 53 years ago. His smoking use included cigarettes. He has a 2.50 pack-year smoking history. He quit smokeless tobacco use about 71 years ago. He reports that he does not drink alcohol and does not use drugs.   Family History:  The patient's family history includes Cancer in his son; Heart Problems in his brother; Heart disease in his father, maternal grandfather, maternal grandmother, mother, paternal grandfather, and paternal grandmother; Prostate cancer in his paternal uncle.   ROS:  Please see the history of present illness.   Otherwise, review of systems is positive for none.   All other systems are reviewed and negative.   PHYSICAL EXAM: VS:  BP (!) 150/63   Pulse 70   Temp (!) 97.5 F (36.4 C) (  Oral)   Resp 14   Ht 5\' 6"  (1.676 m)   Wt 52.6 kg   SpO2 95%   BMI 18.72 kg/m  , BMI Body mass index is 18.72 kg/m. GEN: Well nourished, well developed, in no acute distress  HEENT: normal  Neck: no JVD, carotid bruits, or masses Cardiac: RRR; no murmurs, rubs, or gallops,no edema  Respiratory:  clear to auscultation bilaterally, normal work of breathing GI: soft, nontender, nondistended, + BS MS: no deformity or atrophy  Skin: warm and dry Neuro:  Strength and sensation are intact Psych: euthymic mood, full affect   Recent Labs: 10/08/2020: Magnesium 2.3 11/05/2020: Hemoglobin 11.8; Platelets 171 11/13/2020: BUN 18; Creatinine, Ser 1.47; Potassium 4.3; Sodium 136    Lipid Panel     Component Value Date/Time   CHOL 131 08/08/2020 1338   TRIG 50 08/08/2020 1338   HDL 52 08/08/2020 1338   CHOLHDL 2.5 08/08/2020 1338   VLDL 10 08/08/2020 1338   LDLCALC 69 08/08/2020 1338     Wt Readings from Last 3 Encounters:  11/19/20 52.6 kg  11/13/20 52.8 kg  10/08/20 54.6 kg      Other studies Reviewed: Additional studies/ records that were reviewed today include: TTE 12/29/18  Review of the  above records today demonstrates:  - Left ventricle: The cavity size was normal. Systolic function was   normal. The estimated ejection fraction was in the range of 55%   to 60%. Hypokinesis of the basalanteroseptal and inferoseptal   myocardium. The study is not technically sufficient to allow   evaluation of LV diastolic function. - Aortic valve: Valve mobility was restricted. Transvalvular   velocity was within the normal range. There was no stenosis.   There was no regurgitation. Mean gradient (S): 8 mm Hg. Valve   area (VTI): 1.28 cm^2. Valve area (Vmax): 1.28 cm^2. Valve area   (Vmean): 1.26 cm^2. - Mitral valve: Transvalvular velocity was within the normal range.   There was no evidence for stenosis. There was mild regurgitation. - Left atrium: The atrium was moderately dilated. - Right ventricle: The cavity size was normal. Wall thickness was   normal. Systolic function was normal. - Tricuspid valve: There was mild regurgitation. - Pulmonary arteries: Systolic pressure was within the normal   range. PA peak pressure: 35 mm Hg (S).   ASSESSMENT AND PLAN:  1.  Persistent atrial fibrillation/atypical atrial flutter: [name] has presented today for surgery, with the diagnosis of atrial fibrillation/flutter.  The various methods of treatment have been discussed with the patient and family. After consideration of risks, benefits and other options for treatment, the patient has consented to  Procedure(s): Catheter ablation as a surgical intervention .  Risks include but not limited to complete heart block, stroke, esophageal damage, nerve damage, bleeding, vascular damage, tamponade, perforation, MI, and death. The patient's history has been reviewed, patient examined, no change in status, stable for surgery.  I have reviewed the patient's chart and labs.  Questions were answered to the patient's satisfaction.    Zena Vitelli Curt Bears, MD 11/19/2020 10:31 AM

## 2020-11-19 NOTE — Anesthesia Preprocedure Evaluation (Addendum)
Anesthesia Evaluation  Patient identified by MRN, date of birth, ID band Patient awake    Reviewed: Allergy & Precautions, NPO status , Patient's Chart, lab work & pertinent test results  History of Anesthesia Complications (+) DIFFICULT AIRWAY  Airway Mallampati: II  TM Distance: >3 FB Neck ROM: Full    Dental no notable dental hx. (+) Dental Advisory Given, Teeth Intact   Pulmonary sleep apnea , COPD, former smoker,    Pulmonary exam normal breath sounds clear to auscultation       Cardiovascular hypertension, + angina + CAD, + Past MI, + Cardiac Stents, + CABG and +CHF  Normal cardiovascular exam+ dysrhythmias (afib s/p ablationx3) Atrial Fibrillation  Rhythm:Regular Rate:Normal  TTE 2020 1. Left ventricular ejection fraction, by visual estimation, is 50 to  55%. The left ventricle has low normal function. There is mildly increased  left ventricular hypertrophy. Basal inferior, inferoseptal and  inferolateraly hypokinesis with a  hyperdynamic apex.  2. Left ventricular diastolic function could not be evaluated pattern of  LV diastolic filling.  3. Global right ventricle has moderately reduced systolic function.The  right ventricular size is normal. No increase in right ventricular wall  thickness.  4. Left atrial size was moderately dilated.  5. Right atrial size was normal.  6. Mild mitral annular calcification.  7. Moderate thickening of the mitral valve leaflet(s).  8. The mitral valve is abnormal. Trace mitral valve regurgitation.  9. The inferior vena cava is normal in size with greater than 50%  respiratory variability, suggesting right atrial pressure of 3 mmHg.  10. The interatrial septum is aneurysmal.  11. Small PFO suspected by color doppler. Consider limited saline bubble  study.  12. The aortic valve is tricuspid Aortic valve regurgitation was not  visualized by color flow Doppler. Mild aortic valve  sclerosis without  stenosis.  13. The tricuspid valve is grossly normal. Tricuspid valve regurgitation  is trivial.  14. The pulmonic valve was grossly normal. Pulmonic valve regurgitation is  not visualized by color flow Doppler.  [place] 2020 1. Severe stenosis proximal LAD with moderate disease in the mid LAD/septal perforator.  2. Severe stenosis SVG-RCA system.   Discussed with Dr. Irish Lack, plan PCI to LAD system +/- PCI SVG-RCA today. He is on Xarelto at home so would limit duration of aspirin therapy.     Neuro/Psych CVA negative psych ROS   GI/Hepatic Neg liver ROS, GERD  ,  Endo/Other  negative endocrine ROS  Renal/GU Renal InsufficiencyRenal disease (Cr 1.47, K 4.3)  negative genitourinary   Musculoskeletal negative musculoskeletal ROS (+)   Abdominal   Peds  Hematology  (+) Blood dyscrasia (on eliquis), ,   Anesthesia Other Findings   Reproductive/Obstetrics                           Anesthesia Physical Anesthesia Plan  ASA: III  Anesthesia Plan: General   Post-op Pain Management:    Induction: Intravenous  PONV Risk Score and Plan: 2 and Midazolam, Dexamethasone and Ondansetron  Airway Management Planned: Oral ETT  Additional Equipment:   Intra-op Plan:   Post-operative Plan: Extubation in OR  Informed Consent: I have reviewed the patients History and Physical, chart, labs and discussed the procedure including the risks, benefits and alternatives for the proposed anesthesia with the patient or authorized representative who has indicated his/her understanding and acceptance.     Dental advisory given  Plan Discussed with: CRNA  Anesthesia Plan Comments:  Anesthesia Quick Evaluation  

## 2020-11-19 NOTE — Transfer of Care (Signed)
Immediate Anesthesia Transfer of Care Note  Patient: [name]  Procedure(s) Performed: ATRIAL FIBRILLATION ABLATION (N/A )  Patient Location: Cath Lab  Anesthesia Type:General  Level of Consciousness: awake, alert  and oriented  Airway & Oxygen Therapy: Patient Spontanous Breathing and Patient connected to nasal cannula oxygen  Post-op Assessment: Report given to RN and Post -op Vital signs reviewed and stable  Post vital signs: Reviewed and stable  Last Vitals:  Vitals Value Taken Time  BP 143/102 11/19/20 1410  Temp    Pulse 69 11/19/20 1411  Resp 16 11/19/20 1411  SpO2 99 % 11/19/20 1411  Vitals shown include unvalidated device data.  Last Pain:  Vitals:   11/19/20 0958  TempSrc:   PainSc: 0-No pain      Patients Stated Pain Goal: 2 (54/86/28 2417)  Complications: No complications documented.

## 2020-11-19 NOTE — Anesthesia Procedure Notes (Signed)
Procedure Name: Intubation Date/Time: 11/19/2020 11:42 AM Performed by: Inda Coke, CRNA Pre-anesthesia Checklist: Patient identified, Emergency Drugs available, Suction available and Patient being monitored Patient Re-evaluated:Patient Re-evaluated prior to induction Oxygen Delivery Method: Circle System Utilized Preoxygenation: Pre-oxygenation with 100% oxygen Induction Type: IV induction Ventilation: Mask ventilation without difficulty Laryngoscope Size: Mac and 4 Grade View: Grade II Tube type: Oral Tube size: 7.0 mm Number of attempts: 1 Airway Equipment and Method: Stylet and Oral airway Placement Confirmation: ETT inserted through vocal cords under direct vision,  positive ETCO2 and breath sounds checked- equal and bilateral Secured at: 22 cm Tube secured with: Tape Dental Injury: Teeth and Oropharynx as per pre-operative assessment

## 2020-11-21 ENCOUNTER — Encounter (HOSPITAL_COMMUNITY): Payer: Self-pay | Admitting: Cardiology

## 2020-11-25 ENCOUNTER — Encounter (HOSPITAL_COMMUNITY): Payer: Self-pay | Admitting: Cardiology

## 2020-12-03 DIAGNOSIS — Z125 Encounter for screening for malignant neoplasm of prostate: Secondary | ICD-10-CM | POA: Diagnosis not present

## 2020-12-03 DIAGNOSIS — M859 Disorder of bone density and structure, unspecified: Secondary | ICD-10-CM | POA: Diagnosis not present

## 2020-12-03 DIAGNOSIS — E785 Hyperlipidemia, unspecified: Secondary | ICD-10-CM | POA: Diagnosis not present

## 2020-12-03 DIAGNOSIS — E291 Testicular hypofunction: Secondary | ICD-10-CM | POA: Diagnosis not present

## 2020-12-05 ENCOUNTER — Telehealth (HOSPITAL_COMMUNITY): Payer: Self-pay

## 2020-12-05 NOTE — Telephone Encounter (Signed)
error 

## 2020-12-08 ENCOUNTER — Other Ambulatory Visit: Payer: Self-pay

## 2020-12-08 ENCOUNTER — Encounter (HOSPITAL_COMMUNITY): Payer: Self-pay | Admitting: Physician Assistant

## 2020-12-08 ENCOUNTER — Other Ambulatory Visit (HOSPITAL_COMMUNITY): Payer: Self-pay | Admitting: *Deleted

## 2020-12-08 ENCOUNTER — Ambulatory Visit (HOSPITAL_COMMUNITY)
Admission: RE | Admit: 2020-12-08 | Discharge: 2020-12-08 | Disposition: A | Discharge status: Home / Self Care | Payer: Medicare Other | Source: Ambulatory Visit | Attending: Physician Assistant | Admitting: Physician Assistant

## 2020-12-08 VITALS — BP 128/72 | HR 79 | Ht 66.0 in | Wt 117.4 lb

## 2020-12-08 DIAGNOSIS — J449 Chronic obstructive pulmonary disease, unspecified: Secondary | ICD-10-CM | POA: Insufficient documentation

## 2020-12-08 DIAGNOSIS — I2510 Atherosclerotic heart disease of native coronary artery without angina pectoris: Secondary | ICD-10-CM | POA: Diagnosis not present

## 2020-12-08 DIAGNOSIS — Z951 Presence of aortocoronary bypass graft: Secondary | ICD-10-CM | POA: Insufficient documentation

## 2020-12-08 DIAGNOSIS — Z8673 Personal history of transient ischemic attack (TIA), and cerebral infarction without residual deficits: Secondary | ICD-10-CM | POA: Insufficient documentation

## 2020-12-08 DIAGNOSIS — Z87891 Personal history of nicotine dependence: Secondary | ICD-10-CM | POA: Diagnosis not present

## 2020-12-08 DIAGNOSIS — D6869 Other thrombophilia: Secondary | ICD-10-CM

## 2020-12-08 DIAGNOSIS — I4819 Other persistent atrial fibrillation: Secondary | ICD-10-CM

## 2020-12-08 DIAGNOSIS — I10 Essential (primary) hypertension: Secondary | ICD-10-CM | POA: Insufficient documentation

## 2020-12-08 DIAGNOSIS — Z7901 Long term (current) use of anticoagulants: Secondary | ICD-10-CM | POA: Insufficient documentation

## 2020-12-08 DIAGNOSIS — G4733 Obstructive sleep apnea (adult) (pediatric): Secondary | ICD-10-CM | POA: Diagnosis not present

## 2020-12-08 DIAGNOSIS — Z79899 Other long term (current) drug therapy: Secondary | ICD-10-CM | POA: Insufficient documentation

## 2020-12-08 LAB — BASIC METABOLIC PANEL
Anion gap: 11 (ref 5–15)
BUN: 18 mg/dL (ref 8–23)
CO2: 28 mmol/L (ref 22–32)
Calcium: 10.3 mg/dL (ref 8.9–10.3)
Chloride: 101 mmol/L (ref 98–111)
Creatinine, Ser: 1.21 mg/dL (ref 0.61–1.24)
GFR, Estimated: 60 mL/min — ABNORMAL LOW (ref >60)
Glucose, Bld: 88 mg/dL (ref 70–99)
Potassium: 4.4 mmol/L (ref 3.5–5.1)
Sodium: 140 mmol/L (ref 135–145)

## 2020-12-08 LAB — MAGNESIUM: Magnesium: 2.5 mg/dL — ABNORMAL HIGH (ref 1.7–2.4)

## 2020-12-08 NOTE — Progress Notes (Signed)
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 The patient was initially diagnosed with atrial fibrillation several years ago and is s/p afib ablation 06/25/14 at [facility] by Dr. Lehman Prom and has been maintained on Tikosyn since. He has had some possible a fib, atrial flutter vs ectopic atrial rhythm. On 12/07/18, he was noted to be in an ectopic atrial rhythm with 2:1 block and was cardioverted on 12/11/18. On review of his ECGs, it appears he had some transient AV dissociation post cardioversion but is back in SR on follow up. He has been asymptomatic. He denies bleeding issues with anticoagulation. He has OSA and struggles with using his CPAP. He is now on an oral device and follows with Dr Radford Pax and Dr Toy Cookey.  Patient is s/p repeat afib ablation 06/08/19 with Dr Curt Bears. Patient admitted 10/24-10/26/20 with acute CVA after holding anticoagulation for oral surgery. He is on Eliquis for a CHADS2VASC score of 6. Patient is s/p DCCV 11/20/19. Patient was back in atrial flutter at his visit with Dr Curt Bears on 06/10/20. His BB was stopped at that point 2/2 side effects of fatigue. Patient reports he feels like he has more energy off the BB however, his heart rate had been elevated around 120-130 at home. Patient is s/p DCCV on 07/10/20.   On follow up today, patient is s/p repeat ablation 11/19/20 with Dr Curt Bears. He reports that he has noted an irregular pulse on his home BP machine starting 12/5. He does have some fatigue but otherwise feels well. ECG today shows sinus rhythm with frequent junctional beats. He denies CP, swallowing, or groin issues.  Today, he denies symptoms of palpitations, SOB, chest  pain, orthopnea, PND, lower extremity edema, presyncope, syncope, bleeding, or neurologic sequela. The patient is tolerating medications without difficulties and is otherwise without complaint today.    Atrial Fibrillation Risk Factors:  he does have symptoms or diagnosis of sleep apnea. he is compliant with OSA therapy. Now on oral appliance.    he has a BMI of Body mass index is 18.95 kg/m.[name] Filed Weights   12/08/20 1016  Weight: 53.3 kg     Atrial Fibrillation Management history:  Previous antiarrhythmic drugs: Tikosyn (avoid amio given abn PFTs) Previous cardioversions: 12/11/18, 11/20/19, 07/10/20 Previous ablations: 2015 at [facility], 06/08/19, 11/19/20 CHADS2VASC score: 6 (age, HTN, CAD, CVA) Anticoagulation history: Xarelto, Eliquis   Past Medical History:  Diagnosis Date  . Abnormal nuclear cardiac imaging test March 2011   Has positive EKG response, no perfusion defect and normal EF  . Adenomatous colon polyp 12/2002  . Arrhythmia   . Atrial fibrillation ([place])    a. s/p rfca;  b. chronic tikosyn and xarelto.  . 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 Cataract    bil cataracts removed  . Chronic diastolic CHF (congestive heart failure) ([place])    a. 04/2017 Echo: EF 65-70%, restrictive physiology, mildly dil LA.  [name] COPD (chronic obstructive pulmonary disease) ([place])   . Coronary artery disease    First MI in 1988. PCI in 1996 with subsequent CABG in 1996 due to restenosis. S/P stents to LCX in 2009. Noted to have residual disease in the  LAD in a diffuse manner and atretic LIMA graft. He is managed medically.   . Difficult intubation    06/08/19 update - Intubated with MAC3 with grade IIb view 7.0 tube passed easily  . Dysrhythmia    Afib  . Esophageal stricture   . GERD (gastroesophageal reflux disease)   . 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 History of Raynaud's syndrome   . Hyperlipidemia   .  Hypertension   . Internal hemorrhoids   . Muscle spasm   . Myocardial infarction [facility])    MI x1 50 - 73 age  . Neuromuscular disorder ([place])    Polio - age 76  . OSA (obstructive sleep apnea) 10/16/2018   Severe OSA with AHI 37.2/hr on BiPAP  . Persistent atrial fibrillation ([place])   . Polio    age 3  . PVC (premature ventricular contraction)   . Scoliosis    mild   Past Surgical History:  Procedure Laterality Date  . ABLATION     done for a fib  . APPENDECTOMY     Age 37  . ATRIAL FIBRILLATION ABLATION N/A 06/08/2019   Procedure: ATRIAL FIBRILLATION ABLATION;  Surgeon: Constance Haw, MD;  Location: [place] CV LAB;  Service: Cardiovascular;  Laterality: N/A;  . ATRIAL FIBRILLATION ABLATION N/A 11/19/2020   Procedure: ATRIAL FIBRILLATION ABLATION;  Surgeon: Constance Haw, MD;  Location: [place] CV LAB;  Service: Cardiovascular;  Laterality: N/A;  . CARDIAC CATHETERIZATION    . CARDIOVERSION N/A 08/22/2013   Procedure: CARDIOVERSION;  Surgeon: Carlena Bjornstad, MD;  Location: [facility] ENDOSCOPY;  Service: Cardiovascular;  Laterality: N/A;  . CARDIOVERSION N/A 01/16/2014   Procedure: CARDIOVERSION;  Surgeon: Lelon Perla, MD;  Location: [facility] ENDOSCOPY;  Service: Cardiovascular;  Laterality: N/A;  . CARDIOVERSION N/A 12/11/2018   Procedure: CARDIOVERSION;  Surgeon: Larey Dresser, MD;  Location[facility] ENDOSCOPY;  Service: Cardiovascular;  Laterality: N/A;  . CARDIOVERSION N/A 11/20/2019   Procedure: CARDIOVERSION;  Surgeon: [name], MD;  Location: [facility] ENDOSCOPY;  Service: Endoscopy;  Laterality: N/A;  . CARDIOVERSION N/A 07/10/2020   Procedure: CARDIOVERSION;  Surgeon: Larey Dresser, MD;  Location: [facility] ENDOSCOPY;  Service: Cardiovascular;  Laterality: N/A;  . COLONOSCOPY  2008  . CORONARY ARTERY BYPASS GRAFT  1996   with a LIMA to the LAD, SVG to RCA and SVG to OM  . CORONARY STENT INTERVENTION N/A 03/26/2019   Procedure: CORONARY STENT INTERVENTION;   Surgeon: Jettie Booze, MD;  Location: [place] CV LAB;  Service: Cardiovascular;  Laterality: N/A;  . CORONARY STENT PLACEMENT  1996   LCX  . EMBOLECTOMY Right 05/09/2019   Procedure: Embolectomy of right radial artery;  Surgeon: Serafina Mitchell, MD;  Location: [place];  Service: Vascular;  Laterality: Right;  . ESOPHAGEAL DILATION    . EYE SURGERY    . FALSE ANEURYSM REPAIR Right 05/09/2019   Procedure: REPAIR FALSE ANEURYSM RIGHT RADIAL;  Surgeon: Serafina Mitchell, MD;  Location: [place];  Service: Vascular;  Laterality: Right;  . LEFT HEART CATH AND CORS/GRAFTS ANGIOGRAPHY N/A 03/26/2019   Procedure: LEFT HEART CATH AND CORS/GRAFTS ANGIOGRAPHY;  Surgeon: Larey Dresser, MD;  Location: [place] CV LAB;  Service: Cardiovascular;  Laterality: N/A;  . POLYPECTOMY    . TONSILLECTOMY    . UPPER GASTROINTESTINAL ENDOSCOPY     esophegeal dilation    Current Outpatient Medications  Medication Sig Dispense Refill  . acetaminophen (TYLENOL) 500  MG tablet Take 250-500 mg by mouth every 8 (eight) hours as needed for mild pain or moderate pain.     [name] albuterol (PROAIR HFA) 108 (90 BASE) MCG/ACT inhaler Inhale 2 puffs into the lungs every 4 (four) hours as needed for wheezing or shortness of breath (and prior to exercise). 1 Inhaler 3  . dicyclomine (BENTYL) 10 MG capsule Take 10 mg by mouth 3 (three) times daily as needed for spasms (abdominal pain/spasms).     [name] diltiazem (CARDIZEM CD) 120 MG 24 hr capsule TAKE 1 CAPSULE BY MOUTH EVERY DAY 90 capsule 1  . diltiazem (CARDIZEM CD) 240 MG 24 hr capsule Take 1 capsule (240 mg total) by mouth daily. 30 capsule 6  . dofetilide (TIKOSYN) 250 MCG capsule Take 250 mcg by mouth 2 (two) times daily. (0800 & 2000)    . ELIQUIS 2.5 MG TABS tablet TAKE 1 TABLET BY MOUTH TWICE A DAY 180 tablet 1  . ezetimibe (ZETIA) 10 MG tablet TAKE 1 TABLET BY MOUTH EVERY DAY 90 tablet 3  . famotidine (PEPCID) 40 MG tablet Take 1 tablet (40 mg total) by mouth at  bedtime. 90 tablet 3  . furosemide (LASIX) 20 MG tablet Take 20 mg by mouth See admin instructions. Take 1 tablet (20 mg) by mouth scheduled in the morning, may take an additional dose in the afternoon if needed for fluid retention/coughing.    [name] KLOR-CON M20 20 MEQ tablet TAKE 1 TABLET BY MOUTH AT BEDTIME 90 tablet 3  . lactobacillus acidophilus & bulgar (LACTINEX) chewable tablet Chew 2 tablets by mouth at bedtime.     [name] loperamide (IMODIUM) 2 MG capsule Take 2 mg by mouth See admin instructions. Take 1 capsule (2 mg) by mouth in the morning & may take an additional dose in the evening if needed for diarrhea/loose stools.    [name] LORazepam (ATIVAN) 0.5 MG tablet Take 0.5 mg by mouth at bedtime.    . methocarbamol (ROBAXIN) 500 MG tablet Take 500 mg by mouth 3 (three) times daily as needed for muscle spasms.     . nitroGLYCERIN (NITROSTAT) 0.4 MG SL tablet Place 1 tablet (0.4 mg total) under the tongue every 5 (five) minutes as needed for chest pain. 25 tablet 0  . Probiotic Product (ALIGN) 4 MG CAPS Take 4 mg by mouth daily. (0800)    . rosuvastatin (CRESTOR) 40 MG tablet TAKE 1 TABLET BY MOUTH EVERY DAY 90 tablet 3  . Testosterone POWD Apply 1 mL topically daily. Testosterone (AT) 150/0(150mg ) cpd    . traZODone (DESYREL) 50 MG tablet Take 50 mg by mouth at bedtime.    . Vitamin D, Ergocalciferol, (DRISDOL) 50000 UNITS CAPS capsule Take 50,000 Units by mouth every 14 (fourteen) days. Take on 1st and 15th     No current facility-administered medications for this encounter.    Allergies  Allergen Reactions  . Morphine And Related Other (See Comments)    IV forms- vein irritation  . Tape Other (See Comments)    Skin Irritation    Social History   Socioeconomic History  . Marital status: Married    Spouse name: Inez Catalina  . Number of children: 2  . Years of education: Not on file  . Highest education level: Not on file  Occupational History  . Occupation: Pharmacist  Tobacco Use  .  Smoking status: Former Smoker    Packs/day: 0.50    Years: 5.00    Pack years: 2.50    Types:  Cigarettes    Quit date: 12/27/1966    Years since quitting: 53.9  . Smokeless tobacco: Former Systems developer    Quit date: Art therapist  . Vaping Use: Never used  Substance and Sexual Activity  . Alcohol use: No  . Drug use: No  . Sexual activity: Yes  Other Topics Concern  . 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 Heart disease Mother   . Heart disease Father   . Heart Problems Brother        heart transplant  . Heart disease Maternal Grandmother   . Heart disease Maternal Grandfather   . Heart disease Paternal Grandmother   . Heart disease Paternal Grandfather   . Prostate cancer Paternal Uncle   . Cancer Son        thymic cancer.  died at age 69  . Colon cancer Neg Hx   . Esophageal cancer Neg Hx   . Pancreatic cancer Neg Hx   . Rectal cancer Neg Hx   . Stomach cancer Neg Hx     ROS- All systems are reviewed and negative except as per the HPI above.  Physical Exam: Vitals:   12/08/20 1016  BP: 128/72  Pulse: 79  Weight: 53.3 kg  Height: 5\' 6"  (1.676 m)    GEN- The patient is well appearing elderly male, alert and oriented x 3 today.   HEENT-head normocephalic, atraumatic, sclera clear, conjunctiva pink, hearing intact, trachea midline. Lungs- Clear to ausculation bilaterally, normal work of breathing Heart- Regular rate and rhythm, frequent ectopic beats, no murmurs, rubs or gallops  GI- soft, NT, ND, + BS Extremities- no clubbing, cyanosis, or edema MS- no  significant deformity or atrophy Skin- no rash or lesion Psych- euthymic mood, full affect Neuro- strength and sensation are intact   Wt Readings from Last 3 Encounters:  12/08/20 53.3 kg  11/19/20 52.6 kg  11/13/20 52.8 kg    EKG today demonstrates sinus rhythm with frequent junctional beats HR 79, PR 208, QRS 104, QTc 472. Reviewed with Dr Curt Bears.  Echo 03/26/19 demonstrated   1. The left ventricle has mildly reduced systolic function, with an ejection fraction of 45-50%. The cavity size was mildly dilated. Left ventricular diastolic Doppler parameters are indeterminate.  2. Posterior lateral and inferior basal hypokinesis.  3. The right ventricle has normal systolic function. The cavity was normal. There is no increase in right ventricular wall thickness.  4. Left atrial size was mildly dilated.  5. Right atrial size was mildly dilated.  6. The mitral valve is degenerative. Moderate thickening of the mitral valve leaflet. Mild calcification of the mitral valve leaflet. There is mild mitral annular calcification present.  7. Tricuspid valve regurgitation is mild-moderate.  8. The aortic valve is tricuspid. Moderate thickening of the aortic valve. Moderate sclerosis of the aortic valve.  Epic records reviewed.   Assessment and Plan:  1. Persistent atrial fibrillation/atypical atrial flutter S/p repeat afib ablation with Dr Curt Bears 06/08/19. Per procedure report, flutter unable to be ablated  due to complexity of atrial flutter. S/p repeat ablation 11/19/20 Will decrease diltiazem to 240 mg daily. Recall he did not tolerate BB 2/2 side effects in the past.  Continue Eliquis 2.5 mg BID  Continue dofetilide 250 mcg BID. QT stable. Check bmet/mag today.  This patients CHA2DS2-VASc Score and unadjusted Ischemic Stroke Rate (% per year) is equal to 9.7 % stroke rate/year from a score of 6  Above score calculated as 1 point each if present [CHF, HTN, DM, Vascular=MI/PAD/Aortic Plaque,  Age if 65-74, or Male] Above score calculated as 2 points each if present [Age > 75, or Stroke/TIA/TE]  2. Obstructive sleep apnea Unable to tolerate facemask.  Patient compliant with oral device.  3. CAD/Ischemic CM No anginal symptoms.  4. HTN Stable, med changes as above.   Follow up in the AF clinic as scheduled.    [facility] [address] [place], [place] [address] [phone]

## 2020-12-10 DIAGNOSIS — I2510 Atherosclerotic heart disease of native coronary artery without angina pectoris: Secondary | ICD-10-CM | POA: Diagnosis not present

## 2020-12-10 DIAGNOSIS — Z681 Body mass index (BMI) 19 or less, adult: Secondary | ICD-10-CM | POA: Diagnosis not present

## 2020-12-10 DIAGNOSIS — I509 Heart failure, unspecified: Secondary | ICD-10-CM | POA: Diagnosis not present

## 2020-12-10 DIAGNOSIS — I130 Hypertensive heart and chronic kidney disease with heart failure and stage 1 through stage 4 chronic kidney disease, or unspecified chronic kidney disease: Secondary | ICD-10-CM | POA: Diagnosis not present

## 2020-12-10 DIAGNOSIS — N1831 Chronic kidney disease, stage 3a: Secondary | ICD-10-CM | POA: Diagnosis not present

## 2020-12-10 DIAGNOSIS — E785 Hyperlipidemia, unspecified: Secondary | ICD-10-CM | POA: Diagnosis not present

## 2020-12-10 DIAGNOSIS — R82998 Other abnormal findings in urine: Secondary | ICD-10-CM | POA: Diagnosis not present

## 2020-12-10 DIAGNOSIS — Z0000 Encounter for general adult medical examination without abnormal findings: Secondary | ICD-10-CM | POA: Diagnosis not present

## 2020-12-10 DIAGNOSIS — I480 Paroxysmal atrial fibrillation: Secondary | ICD-10-CM | POA: Diagnosis not present

## 2020-12-22 ENCOUNTER — Ambulatory Visit (HOSPITAL_COMMUNITY): Payer: Medicare Other | Admitting: Physician Assistant

## 2021-01-06 ENCOUNTER — Ambulatory Visit: Payer: Medicare Other | Admitting: Cardiology

## 2021-01-08 ENCOUNTER — Other Ambulatory Visit: Payer: Self-pay

## 2021-01-08 ENCOUNTER — Ambulatory Visit (HOSPITAL_BASED_OUTPATIENT_CLINIC_OR_DEPARTMENT_OTHER)
Admission: RE | Admit: 2021-01-08 | Discharge: 2021-01-08 | Disposition: A | Discharge status: Home / Self Care | Payer: Medicare Other | Source: Ambulatory Visit | Attending: Cardiology | Admitting: Cardiology

## 2021-01-08 ENCOUNTER — Encounter (HOSPITAL_COMMUNITY): Payer: Self-pay | Admitting: Cardiology

## 2021-01-08 ENCOUNTER — Ambulatory Visit (HOSPITAL_COMMUNITY)
Admission: RE | Admit: 2021-01-08 | Discharge: 2021-01-08 | Disposition: A | Discharge status: Home / Self Care | Payer: Medicare Other | Source: Ambulatory Visit | Attending: Internal Medicine | Admitting: Internal Medicine

## 2021-01-08 VITALS — BP 130/60 | HR 112 | Wt 118.4 lb

## 2021-01-08 DIAGNOSIS — I5032 Chronic diastolic (congestive) heart failure: Secondary | ICD-10-CM | POA: Diagnosis not present

## 2021-01-08 DIAGNOSIS — I252 Old myocardial infarction: Secondary | ICD-10-CM | POA: Diagnosis not present

## 2021-01-08 DIAGNOSIS — Z951 Presence of aortocoronary bypass graft: Secondary | ICD-10-CM | POA: Insufficient documentation

## 2021-01-08 DIAGNOSIS — Z8673 Personal history of transient ischemic attack (TIA), and cerebral infarction without residual deficits: Secondary | ICD-10-CM | POA: Insufficient documentation

## 2021-01-08 DIAGNOSIS — I130 Hypertensive heart and chronic kidney disease with heart failure and stage 1 through stage 4 chronic kidney disease, or unspecified chronic kidney disease: Secondary | ICD-10-CM | POA: Diagnosis not present

## 2021-01-08 DIAGNOSIS — E785 Hyperlipidemia, unspecified: Secondary | ICD-10-CM | POA: Diagnosis not present

## 2021-01-08 DIAGNOSIS — Z79899 Other long term (current) drug therapy: Secondary | ICD-10-CM | POA: Insufficient documentation

## 2021-01-08 DIAGNOSIS — I255 Ischemic cardiomyopathy: Secondary | ICD-10-CM | POA: Diagnosis not present

## 2021-01-08 DIAGNOSIS — Z955 Presence of coronary angioplasty implant and graft: Secondary | ICD-10-CM | POA: Diagnosis not present

## 2021-01-08 DIAGNOSIS — I083 Combined rheumatic disorders of mitral, aortic and tricuspid valves: Secondary | ICD-10-CM | POA: Diagnosis not present

## 2021-01-08 DIAGNOSIS — Z7901 Long term (current) use of anticoagulants: Secondary | ICD-10-CM | POA: Diagnosis not present

## 2021-01-08 DIAGNOSIS — I2510 Atherosclerotic heart disease of native coronary artery without angina pectoris: Secondary | ICD-10-CM | POA: Diagnosis not present

## 2021-01-08 DIAGNOSIS — I484 Atypical atrial flutter: Secondary | ICD-10-CM

## 2021-01-08 DIAGNOSIS — I4891 Unspecified atrial fibrillation: Secondary | ICD-10-CM | POA: Insufficient documentation

## 2021-01-08 DIAGNOSIS — N183 Chronic kidney disease, stage 3 unspecified: Secondary | ICD-10-CM | POA: Insufficient documentation

## 2021-01-08 DIAGNOSIS — G4733 Obstructive sleep apnea (adult) (pediatric): Secondary | ICD-10-CM | POA: Diagnosis not present

## 2021-01-08 LAB — ECHOCARDIOGRAM COMPLETE
Area-P 1/2: 4.15 cm2
Calc EF: 55.1 %
MV M vel: 4.85 m/s
MV Peak grad: 94.1 mmHg
Radius: 0.5 cm
S' Lateral: 2.8 cm
Single Plane A2C EF: 63.1 %
Single Plane A4C EF: 42.2 %

## 2021-01-08 NOTE — H&P (View-Only) (Signed)
Heart Failure TeleHealth Note  Due to national recommendations of social distancing due to [place] 19, Audio/video telehealth visit is felt to be most appropriate for this patient at this time.  See MyChart message from today for patient consent regarding telehealth for [facility].  Date:  01/08/2021   ID:  [name], DOB 1936-12-25, MRN [id number]  Location: Home  Provider location: [facility] Type of Visit: Established patient  PCP:  Burnard Bunting, MD  Cardiologist:  [name]   History of Present Illness: [name] is a 85 y.o. male who presents via audio/video conferencing for a telehealth visit today.     he denies symptoms worrisome for COVID 19.   Patient has history of CAD s/p CABG.  Cath in 11/09 showed that the SVG-distal RCA was patent, the CFX system was patent.  His LIMA was atretic and there were serial 60% and 80% stenoses in the native LAD.  He was managed medically.  Echo in 8/14 showed EF 55-60% with moderate MR and moderate TR.   He was initially noted to have atrial fibrillation in the summer of 2014. He was started on Xarelto and cardioverted to NSR in 8/14.  Recurrent atrial fibrillation was noted in 1/15, and he was cardioverted to NSR again.  This time, NSR did not hold long. By 5/15, he was in persistent atrial fibrillation.  I referred him to [facility] where he had atrial fibrillation ablation and Tikosyn loading.  Ranolazine was stopped due to risk of QT prolongation and Imdur was started as an anti-anginal instead.  Lexiscan Cardiolite in 11/15 showed no ischemia.    CTA chest done in 2/16 to look for evidence for PV stenosis post-AF ablation.  This showed mild short-segment narrowing of the left inferior pulmonary vein.   Patient was admitted in 5/18 with acute on chronic diastolic CHF.  [place] had [place] the beach [place] and ate out a number of times, probably getting a significant sodium load.  No chest pain.  He  was in normal sinus rhythm.  He was started on IV Lasix and diuresed.  Echo in 5/18 showed EF 65-70% with moderately dilated RV and normal systolic function, PASP 57 mmHg.  Lexiscan Cardiolite in 6/18 showed no significant perfusion defect.   He was admitted with NSTEMI in 3/20, had DES to ostial LAD and mid SVG-RCA.  Echo showed EF 45-50%.  He was in atypical atrial flutter persistently despite Tikosyn.  After his intervention, he was noted to have a radial artery pseudoaneurysm that was repaired by Dr. Trula Slade.   In 6/20, he had a redo atrial fibrillation ablation.  He was also noted to have complex flutter from multiple foci that was not ablated.  After the procedure, he has had considerable pain in the right leg at the cath site but also radiating down the leg, groin US showed no AV fistula or pseudoaneurysm.    In 10/20, patient held anticoagulation for 2 days for oral surgery and had a CVA presenting as right hand weakness.  He did not get tPA.  Echo in 10/20 showed EF 50-55%, mild LVH, moderately decreased RV function, small PFO. Carotid dopplers showed 1-39% bilateral stenosis. He was switched from Xarelto to Eliquis.   In 11/20, he went into persistent atrial fibrillation and underwent DCCV.  In 7/21, he went into atypical flutter and had DCCV to NSR.  He had a repeat atrial fibrillation ablation in 11/21.  Echo was done today and reviewed, EF 60-65%, normal RV, mild-moderate MR.   He returns for followup of CHF, atrial fibrillation, and CAD.  He was in NSR at last appointment at AF clinic, but he is in atypical flutter (rate-controlled) today.  He is tired by the end of the day but no significant exertional dyspnea.  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 No chest pain.  No orthopnea/PND.  No lightheadedness.   ECG (personally reviewed): atypical flutter, rate 88, QTc 488 msec  Labs (8/12): K 4.1, creatinine 1.2, LDL 91, HDL 53 Labs (8/14): K 4.6, creatinine 1.1 Labs (11/14):  K 4.6, creatinine 1.2, LDL 91, HDL 56 Labs (3/15): AST 38, ALT 32, TSH normal, BNP 237 Labs (7/15): LDL 96, HDL 48, K 4.2, creatinine 1.2, TSH normal, BNP 237, AST 38, ALT 32 Labs (8/15): LFTs normal Labs (11/15): K 4.2, creatinine 1.1, Mg 2.2, BNP 227 Labs (2/16): K 4, creatinine 1.32, BNP 261 Labs (6/18): K 3.9, creatinine 1.6 Labs (7/18): K 3.9, creatinine 1.5, BNP 242 Labs (12/18): K 3.8, creatinine 1.4, hgb 13.9, LDL 73, HDL 52 Labs (3/19): K 3.7, creatinine 1.4, LDL 76, HDL 42 Labs (6/19): K 4.1, creatinine 1.63 Labs (3/20): K 4.2, creatinine 1.29, LDL 53, HDL 47, hgb 13.8 Labs (4/20): K 4.3, creatinine 1.5 Labs (6/20): K 4.2, creatinine 1.84, hgb 12.4 Labs (7/20): K 4.5, creatinine 2.53 => 1.72 Labs (8/20): K 4.7, creatinine 1.5, Mg 2.4 Labs (12/20): LDL 79, K 4.9, creatinine 1.4 Labs (2/21): K 4.5, creatinine 1.56 Labs (8/21): K 4, creatinine 1.4, LDL 69, HDL 52 Labs (12/21): K 4.4, creatinine 1.21, LDL 85  PMH: 1. CAD: 1st MI in 1988.  CABG 1996.  PCI to CFX in 2009.  LHC (11/09) SVG-dRCA patent, total occlusion RCA, patent CFX stent, atretic LIMA, serial 60 and 80% proximal LAD stenoses, EF 60% with basal inferior hypokinesis.  Myoview in 2011 with no ischemia or infarction.  Echo (10/12) with EF 55-60%, mild LVH, mild MR.  Lexiscan Cardiolite in 2013 with no ischemia or infarction.  Echo (8/14) with EF 55-60%, moderate MR, moderate TR, PA systolic pressure 35 mmHg.  Echo (4/15) with EF 60-65%, mild focal basal septal hypertrophy, inferior basal akinesis, mild MR.  Lexiscan cardiolite (11/15) with EF 61%, fixed basal inferoseptal defect, no ischemia.  - Lexiscan Cardiolite (6/18): EF 54%, no perfusion defect.  - NSTEMI 3/20, cath showed 95% ostial/proximal LAD, 60-70% mid LAD, totally occluded SVG-LCx, totally occluded RCA, 80-90% mid SVG-RCA.  Patient had DES to ostial-mid LAD and DES to SVG-RCA.  2. Raynauds syndrome 3. Post-polio syndrome 4. GERD with dilation of esophageal  stricture in 12/12.  5. Hyperlipidemia 6. H/o CVAs: Brainstem stroke with Horner's syndrome.  Recurrent stroke in 10/20 with right hand weakness when off anticoagulation for 2 days.  - Carotid dopplers (10/20) with minimal stenosis.  7. Scoliosis. 8. H/o appendectomy 9. Herpes Zoster 10. Atrial fibrillation/atypical atrial flutter: DCCV to NSR in 8/14. DCCV to NSR in 1/15. Atrial fibrillation ablation 6/15 with Tikosyn loading (at [facility]).   - Redo atrial fibrillation ablation in 6/20.  Patient also noted to have complex flutter with several foci, not ablated.  - Zio patch (7/20): Primarily NSR with a few short SVT runs.  - DCCV 11/20.  - DCCV 7/21 - Redo atrial fibrillation ablation 11/21.  11. PFTs (4/15) with FVC 59%, FEV1 54%, ratio 91%, DLCO 53% => moderate obstructive defect thought to be related to COPD and  severe restrictive defect thought to be due to elevated left hemidiaphragm and post-polio syndrome.  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  Echo (5/18) with EF 65-70%, RV moderately dilated with normal systolic function, PASP 57 mmHg.  - Echo (3/20): EF 45-50%, mild LV dilation, basal inferolateral and inferior hypokinesis.  - Echo (10/20): EF 50-55%, mild LVH, basal inferior and inferoseptal hypokinesis, moderately decreased RV systolic function, small PFO.  - Echo (1/22): EF 60-65%, normal RV, mild-moderate MR.  13. CKD: Stage 3.  14. OSA: dental appliance.  15. Right radial artery pseudoaneurysm: post-cath in 3/20, s/p repair.  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 acetaminophen (TYLENOL) 500 MG tablet Take 250-500 mg by mouth every 8 (eight) hours as needed for mild pain or moderate pain.     [name] albuterol (PROAIR HFA) 108 (90 BASE) MCG/ACT inhaler Inhale 2 puffs into the lungs every 4 (four) hours as needed for wheezing or shortness of breath (and prior to exercise). 1 Inhaler 3  . dicyclomine (BENTYL) 10 MG capsule Take 10 mg  by mouth 3 (three) times daily as needed for spasms (abdominal pain/spasms).     [name] diltiazem (CARDIZEM CD) 240 MG 24 hr capsule Take 1 capsule (240 mg total) by mouth daily. 30 capsule 6  . dofetilide (TIKOSYN) 250 MCG capsule Take 250 mcg by mouth 2 (two) times daily. (0800 & 2000)    . ELIQUIS 2.5 MG TABS tablet TAKE 1 TABLET BY MOUTH TWICE A DAY 180 tablet 1  . ezetimibe (ZETIA) 10 MG tablet TAKE 1 TABLET BY MOUTH EVERY DAY 90 tablet 3  . famotidine (PEPCID) 40 MG tablet Take 1 tablet (40 mg total) by mouth at bedtime. 90 tablet 3  . furosemide (LASIX) 20 MG tablet Take 20 mg by mouth See admin instructions. Take 1 tablet (20 mg) by mouth scheduled in the morning, may take an additional dose in the afternoon if needed for fluid retention/coughing.    [name] KLOR-CON M20 20 MEQ tablet TAKE 1 TABLET BY MOUTH AT BEDTIME 90 tablet 3  . lactobacillus acidophilus & bulgar (LACTINEX) chewable tablet Chew 2 tablets by mouth at bedtime.     [name] loperamide (IMODIUM) 2 MG capsule Take 2 mg by mouth See admin instructions. Take 1 capsule (2 mg) by mouth in the morning & may take an additional dose in the evening if needed for diarrhea/loose stools.    [name] LORazepam (ATIVAN) 0.5 MG tablet Take 0.5 mg by mouth at bedtime.    . methocarbamol (ROBAXIN) 500 MG tablet Take 500 mg by mouth 3 (three) times daily as needed for muscle spasms.     . nitroGLYCERIN (NITROSTAT) 0.4 MG SL tablet Place 1 tablet (0.4 mg total) under the tongue every 5 (five) minutes as needed for chest pain. 25 tablet 0  . Probiotic Product (ALIGN) 4 MG CAPS Take 4 mg by mouth daily. (0800)    . rosuvastatin (CRESTOR) 40 MG tablet TAKE 1 TABLET BY MOUTH EVERY DAY 90 tablet 3  . Testosterone POWD Apply 1 mL topically daily. Testosterone (AT) 150/0(150mg ) cpd    . traZODone (DESYREL) 50 MG tablet Take 50 mg by mouth at bedtime.    . Vitamin D, Ergocalciferol, (DRISDOL) 50000 UNITS CAPS capsule Take 50,000 Units by mouth every 14 (fourteen) days. Take  on 1st and 15th     No current facility-administered medications for this encounter.    Allergies:   Morphine and related and Tape  Social History:  The patient  reports that he quit smoking about 54 years ago. His smoking use included cigarettes. He has a 2.50 pack-year smoking history. He quit smokeless tobacco use about 72 years ago. He reports that he does not drink alcohol and does not use drugs.   Family History:  The patient's family history includes Cancer in his son; Heart Problems in his brother; Heart disease in his father, maternal grandfather, maternal grandmother, mother, paternal grandfather, and paternal grandmother; Prostate cancer in his paternal uncle.   ROS:  Please see the history of present illness.   All other systems are personally reviewed and negative.   Exam:   BP 130/60   Pulse (!) 112   Wt 53.7 kg (118 lb 6.4 oz)   SpO2 98%   BMI 19.11 kg/m   Exam:  (Video/Tele Health Call; Exam is subjective and or/visual.) General:  Speaks in full sentences. No resp difficulty. Neck: No JVD Lungs: Normal respiratory effort with conversation.  Abdomen: Non-distended per patient report Extremities: Pt denies edema. Neuro: Alert & oriented x 3.   Recent Labs: 11/05/2020: Hemoglobin 11.8; Platelets 171 12/08/2020: BUN 18; Creatinine, Ser 1.21; Magnesium 2.5; Potassium 4.4; Sodium 140  Personally reviewed   Wt Readings from Last 3 Encounters:  01/08/21 53.7 kg (118 lb 6.4 oz)  12/08/20 53.3 kg (117 lb 6.4 oz)  11/19/20 52.6 kg (116 lb)      ASSESSMENT AND PLAN:  1. Atrial fibrillation/flutter: He has been on Tikosyn and is s/p atrial fibrillation ablation at [facility] on 06/25/14.  He has also had atypical atrial flutter. He had a redo atrial fibrillation ablation in 6/20.  Complex flutter was noted from several foci and was not ablated.  Zio patch from 7/20 showed predominantly NSR with short runs SVT.  DCCV in 11/20, DCCV again in 7/21. Redo atrial fibrillation ablation  in 11/21.  He is back in atypical atrial flutter today despite Tikosyn. HR is controlled in 80s today.  - Continue Tikosyn for now.  QTc today is acceptable (488 msec). BMET and Mg good recently.  He does not want to switch to amiodarone due to side effect risk, and I do not think that multaq would be likely to be effective if Tikosyn is not working.  - Continue apixaban, this is appropriately dosed at 2.5 mg bid with age and low weight.   - He is now off nebivolol (tolerated poorly) and on diltiazem for rate control.  - I will have him come back for ECG next week.  If still in atrial flutter, I will arrange DCCV.  We discussed risks/benefits and he agrees to procedure if needed.   2. CAD: H/o CABG.  He had NSTEMI in 3/20 with DES to ostial/proximal LAD and SVG-RCA.  No chest pain.  - He will continue apixaban long-term for atrial fibrillation/flutter.  - he is on statin and Zetia.  3. Hyperlipidemia: He is taking Crestor 40 mg daily and Zetia 10 mg daily.  Good lipids in 8/21.    4. Ischemic cardiomyopathy/primarily diastolic CHF: Echo in 0000000 with EF 45-50%, echo in 10/20 with EF 50-55%, echo today with EF 60-65%.  NYHA class II symptoms.  He is not volume overloaded on exam.  - Continue Lasix 20 mg daily, recetn BMET ok.   - He is off losartan with elevated creatinine.  5. Pulmonary: PFTs suggested both a restrictive and obstructive defect.  The restrictive defect is likely from post-polio syndrome and elevated left hemidiaphragm.  Mr Bedner never smoked much, so the obstructive defect is more surprising.  6. OSA: Severe, likely potentiates his atrial fibrillation. He has had a hard time tolerating CPAP.   - Now using an oral appliance.   7. CVA: 10/20, in setting of holding anticoagulation for 2 days.  Avoid holding anticoagulation in the future.  Mild residual effect on right hand dexterity.   COVID screen The patient does not have any symptoms that suggest any further testing/ screening at  this time.  Social distancing reinforced today.  Patient Risk: After full review of this patients clinical status, I feel that they are at moderate risk for cardiac decompensation at this time.  Relevant cardiac medications were reviewed at length with the patient today. The patient does not have concerns regarding their medications at this time.   Recommended follow-up:  Next week for 3 months.   Today, I have spent 25 minutes with the patient with telehealth technology discussing the above issues .    Signed, [name], MD  01/08/2021   [facility] [address] [facility] [place] [address] [phone] (office) [phone] (fax)

## 2021-01-08 NOTE — Progress Notes (Signed)
  Echocardiogram 2D Echocardiogram has been performed.  Jennette Dubin 01/08/2021, 9:52 AM

## 2021-01-08 NOTE — Progress Notes (Signed)
Patient is being seen in the Advanced [facility] Failure Clinic. VS, EKG, and device interrogations performed in the clinic. Dr McLean is at home and seeing patients via telemedicine as he is in quarantine.   

## 2021-01-08 NOTE — Progress Notes (Signed)
Heart Failure TeleHealth Note  Due to national recommendations of social distancing due to [name], Audio/video telehealth visit is felt to be most appropriate for this patient at this time.  See MyChart message from today for patient consent regarding telehealth for [facility].  Date:  01/08/2021   ID:  [name], DOB [birth year], MRN [id number]  Location: Home  Provider location: [facility] Type of Visit: Established patient  PCP:  Burnard Bunting, MD  Cardiologist:  [name]   History of Present Illness: [name] is a 85 y.o. male who presents via audio/video conferencing for a telehealth visit today.     he denies symptoms worrisome for COVID Weiss.   Patient has history of CAD s/p CABG.  Cath in 11/09 showed that the SVG-distal RCA was patent, the CFX system was patent.  His LIMA was atretic and there were serial 60% and 80% stenoses in the native LAD.  He was managed medically.  Echo in 8/14 showed EF 55-60% with moderate MR and moderate TR.   He was initially noted to have atrial fibrillation in the summer of 2014. He was started on Xarelto and cardioverted to NSR in 8/14.  Recurrent atrial fibrillation was noted in 1/15, and he was cardioverted to NSR again.  This time, NSR did not hold long. By 5/15, he was in persistent atrial fibrillation.  I referred him to [name] where he had atrial fibrillation ablation and Tikosyn loading.  Ranolazine was stopped due to risk of QT prolongation and Imdur was started as an anti-anginal instead.  Lexiscan Cardiolite in 11/15 showed no ischemia.    CTA chest done in 2/16 to look for evidence for PV stenosis post-AF ablation.  This showed mild short-segment narrowing of the left inferior pulmonary vein.   Patient was admitted in 5/18 with acute on chronic diastolic CHF.  He had been [place] the beach for [place] days and ate out a number of times, probably getting a significant sodium load.  No chest pain.  He  was in normal sinus rhythm.  He was started on IV Lasix and diuresed.  Echo in 5/18 showed EF 65-70% with moderately dilated RV and normal systolic function, PASP 57 mmHg.  Lexiscan Cardiolite in 6/18 showed no significant perfusion defect.   He was admitted with NSTEMI in 3/20, had DES to ostial LAD and mid SVG-RCA.  Echo showed EF 45-50%.  He was in atypical atrial flutter persistently despite Tikosyn.  After his intervention, he was noted to have a radial artery pseudoaneurysm that was repaired by Dr. Trula Slade.   In 6/20, he had a redo atrial fibrillation ablation.  He was also noted to have complex flutter from multiple foci that was not ablated.  After the procedure, he has had considerable pain in the right leg at the cath site but also radiating down the leg, groin US showed no AV fistula or pseudoaneurysm.    In 10/20, patient held anticoagulation for 2 days for oral surgery and had a CVA presenting as right hand weakness.  He did not get tPA.  Echo in 10/20 showed EF 50-55%, mild LVH, moderately decreased RV function, small PFO. Carotid dopplers showed 1-39% bilateral stenosis. He was switched from Xarelto to Eliquis.   In 11/20, he went into persistent atrial fibrillation and underwent DCCV.  In 7/21, he went into atypical flutter and had DCCV to NSR.  He had a repeat atrial fibrillation ablation in 11/21.  Echo was done today and reviewed, EF 60-65%, normal RV, mild-moderate MR.   He returns for followup of CHF, atrial fibrillation, and CAD.  He was in NSR at last appointment at AF clinic, but he is in atypical flutter (rate-controlled) today.  He is tired by the end of the day but no significant exertional dyspnea.  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 No chest pain.  No orthopnea/PND.  No lightheadedness.   ECG (personally reviewed): atypical flutter, rate 88, QTc 488 msec  Labs (8/12): K 4.1, creatinine 1.2, LDL 91, HDL 53 Labs (8/14): K 4.6, creatinine 1.1 Labs (11/14):  K 4.6, creatinine 1.2, LDL 91, HDL 56 Labs (3/15): AST 38, ALT 32, TSH normal, BNP 237 Labs (7/15): LDL 96, HDL 48, K 4.2, creatinine 1.2, TSH normal, BNP 237, AST 38, ALT 32 Labs (8/15): LFTs normal Labs (11/15): K 4.2, creatinine 1.1, Mg 2.2, BNP 227 Labs (2/16): K 4, creatinine 1.32, BNP 261 Labs (6/18): K 3.9, creatinine 1.6 Labs (7/18): K 3.9, creatinine 1.5, BNP 242 Labs (12/18): K 3.8, creatinine 1.4, hgb 13.9, LDL 73, HDL 52 Labs (3/Weiss): K 3.7, creatinine 1.4, LDL 76, HDL 42 Labs (6/Weiss): K 4.1, creatinine 1.63 Labs (3/20): K 4.2, creatinine 1.29, LDL 53, HDL 47, hgb 13.8 Labs (4/20): K 4.3, creatinine 1.5 Labs (6/20): K 4.2, creatinine 1.84, hgb 12.4 Labs (7/20): K 4.5, creatinine 2.53 => 1.72 Labs (8/20): K 4.7, creatinine 1.5, Mg 2.4 Labs (12/20): LDL 79, K 4.9, creatinine 1.4 Labs (2/21): K 4.5, creatinine 1.56 Labs (8/21): K 4, creatinine 1.4, LDL 69, HDL 52 Labs (12/21): K 4.4, creatinine 1.21, LDL 85  PMH: 1. CAD: 1st MI in 1988.  CABG 1996.  PCI to CFX in 2009.  LHC (11/09) SVG-dRCA patent, total occlusion RCA, patent CFX stent, atretic LIMA, serial 60 and 80% proximal LAD stenoses, EF 60% with basal inferior hypokinesis.  Myoview in 2011 with no ischemia or infarction.  Echo (10/12) with EF 55-60%, mild LVH, mild MR.  Lexiscan Cardiolite in 2013 with no ischemia or infarction.  Echo (8/14) with EF 55-60%, moderate MR, moderate TR, PA systolic pressure 35 mmHg.  Echo (4/15) with EF 60-65%, mild focal basal septal hypertrophy, inferior basal akinesis, mild MR.  Lexiscan cardiolite (11/15) with EF 61%, fixed basal inferoseptal defect, no ischemia.  - Lexiscan Cardiolite (6/18): EF 54%, no perfusion defect.  - NSTEMI 3/20, cath showed 95% ostial/proximal LAD, 60-70% mid LAD, totally occluded SVG-LCx, totally occluded RCA, 80-90% mid SVG-RCA.  Patient had DES to ostial-mid LAD and DES to SVG-RCA.  2. Raynauds syndrome 3. Post-polio syndrome 4. GERD with dilation of esophageal  stricture in 12/12.  5. Hyperlipidemia 6. H/o CVAs: Brainstem stroke with Horner's syndrome.  Recurrent stroke in 10/20 with right hand weakness when off anticoagulation for 2 days.  - Carotid dopplers (10/20) with minimal stenosis.  7. Scoliosis. 8. H/o appendectomy 9. Herpes Zoster 10. Atrial fibrillation/atypical atrial flutter: DCCV to NSR in 8/14. DCCV to NSR in 1/15. Atrial fibrillation ablation 6/15 with Tikosyn loading (at [facility]).   - Redo atrial fibrillation ablation in 6/20.  Patient also noted to have complex flutter with several foci, not ablated.  - Zio patch (7/20): Primarily NSR with a few short SVT runs.  - DCCV 11/20.  - DCCV 7/21 - Redo atrial fibrillation ablation 11/21.  11. PFTs (4/15) with FVC 59%, FEV1 54%, ratio 91%, DLCO 53% => moderate obstructive defect thought to be related to COPD and  severe restrictive defect thought to be due to elevated left hemidiaphragm and post-polio syndrome.  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  Echo (5/18) with EF 65-70%, RV moderately dilated with normal systolic function, PASP 57 mmHg.  - Echo (3/20): EF 45-50%, mild LV dilation, basal inferolateral and inferior hypokinesis.  - Echo (10/20): EF 50-55%, mild LVH, basal inferior and inferoseptal hypokinesis, moderately decreased RV systolic function, small PFO.  - Echo (1/22): EF 60-65%, normal RV, mild-moderate MR.  13. CKD: Stage 3.  14. OSA: dental appliance.  15. Right radial artery pseudoaneurysm: post-cath in 3/20, s/p repair.  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 acetaminophen (TYLENOL) 500 MG tablet Take 250-500 mg by mouth every 8 (eight) hours as needed for mild pain or moderate pain.     [name] albuterol (PROAIR HFA) 108 (90 BASE) MCG/ACT inhaler Inhale 2 puffs into the lungs every 4 (four) hours as needed for wheezing or shortness of breath (and prior to exercise). 1 Inhaler 3  . dicyclomine (BENTYL) 10 MG capsule Take 10 mg  by mouth 3 (three) times daily as needed for spasms (abdominal pain/spasms).     [name] diltiazem (CARDIZEM CD) 240 MG 24 hr capsule Take 1 capsule (240 mg total) by mouth daily. 30 capsule 6  . dofetilide (TIKOSYN) 250 MCG capsule Take 250 mcg by mouth 2 (two) times daily. (0800 & 2000)    . ELIQUIS 2.5 MG TABS tablet TAKE 1 TABLET BY MOUTH TWICE A DAY 180 tablet 1  . ezetimibe (ZETIA) 10 MG tablet TAKE 1 TABLET BY MOUTH EVERY DAY 90 tablet 3  . famotidine (PEPCID) 40 MG tablet Take 1 tablet (40 mg total) by mouth at bedtime. 90 tablet 3  . furosemide (LASIX) 20 MG tablet Take 20 mg by mouth See admin instructions. Take 1 tablet (20 mg) by mouth scheduled in the morning, may take an additional dose in the afternoon if needed for fluid retention/coughing.    [name] KLOR-CON M20 20 MEQ tablet TAKE 1 TABLET BY MOUTH AT BEDTIME 90 tablet 3  . lactobacillus acidophilus & bulgar (LACTINEX) chewable tablet Chew 2 tablets by mouth at bedtime.     [name] loperamide (IMODIUM) 2 MG capsule Take 2 mg by mouth See admin instructions. Take 1 capsule (2 mg) by mouth in the morning & may take an additional dose in the evening if needed for diarrhea/loose stools.    [name] LORazepam (ATIVAN) 0.5 MG tablet Take 0.5 mg by mouth at bedtime.    . methocarbamol (ROBAXIN) 500 MG tablet Take 500 mg by mouth 3 (three) times daily as needed for muscle spasms.     . nitroGLYCERIN (NITROSTAT) 0.4 MG SL tablet Place 1 tablet (0.4 mg total) under the tongue every 5 (five) minutes as needed for chest pain. 25 tablet 0  . Probiotic Product (ALIGN) 4 MG CAPS Take 4 mg by mouth daily. (0800)    . rosuvastatin (CRESTOR) 40 MG tablet TAKE 1 TABLET BY MOUTH EVERY DAY 90 tablet 3  . Testosterone POWD Apply 1 mL topically daily. Testosterone (AT) 150/0(150mg ) cpd    . traZODone (DESYREL) 50 MG tablet Take 50 mg by mouth at bedtime.    . Vitamin D, Ergocalciferol, (DRISDOL) 50000 UNITS CAPS capsule Take 50,000 Units by mouth every 14 (fourteen) days. Take  on 1st and 15th     No current facility-administered medications for this encounter.    Allergies:   Morphine and related and Tape  Social History:  The patient  reports that he quit smoking about 54 years ago. His smoking use included cigarettes. He has a 2.50 pack-year smoking history. He quit smokeless tobacco use about 72 years ago. He reports that he does not drink alcohol and does not use drugs.   Family History:  The patient's family history includes Cancer in his son; Heart Problems in his brother; Heart disease in his father, maternal grandfather, maternal grandmother, mother, paternal grandfather, and paternal grandmother; Prostate cancer in his paternal uncle.   ROS:  Please see the history of present illness.   All other systems are personally reviewed and negative.   Exam:   BP 130/60   Pulse (!) 112   Wt 53.7 kg (118 lb 6.4 oz)   SpO2 98%   BMI Weiss.11 kg/m   Exam:  (Video/Tele Health Call; Exam is subjective and or/visual.) General:  Speaks in full sentences. No resp difficulty. Neck: No JVD Lungs: Normal respiratory effort with conversation.  Abdomen: Non-distended per patient report Extremities: Pt denies edema. Neuro: Alert & oriented x 3.   Recent Labs: 11/05/2020: Hemoglobin 11.8; Platelets 171 12/08/2020: BUN 18; Creatinine, Ser 1.21; Magnesium 2.5; Potassium 4.4; Sodium 140  Personally reviewed   Wt Readings from Last 3 Encounters:  01/08/21 53.7 kg (118 lb 6.4 oz)  12/08/20 53.3 kg (117 lb 6.4 oz)  11/19/20 52.6 kg (116 lb)      ASSESSMENT AND PLAN:  1. Atrial fibrillation/flutter: He has been on Tikosyn and is s/p atrial fibrillation ablation at [facility] on 06/25/14.  He has also had atypical atrial flutter. He had a redo atrial fibrillation ablation in 6/20.  Complex flutter was noted from several foci and was not ablated.  Zio patch from 7/20 showed predominantly NSR with short runs SVT.  DCCV in 11/20, DCCV again in 7/21. Redo atrial fibrillation ablation  in 11/21.  He is back in atypical atrial flutter today despite Tikosyn. HR is controlled in 80s today.  - Continue Tikosyn for now.  QTc today is acceptable (488 msec). BMET and Mg good recently.  He does not want to switch to amiodarone due to side effect risk, and I do not think that multaq would be likely to be effective if Tikosyn is not working.  - Continue apixaban, this is appropriately dosed at 2.5 mg bid with age and low weight.   - He is now off nebivolol (tolerated poorly) and on diltiazem for rate control.  - I will have him come back for ECG next week.  If still in atrial flutter, I will arrange DCCV.  We discussed risks/benefits and he agrees to procedure if needed.   2. CAD: H/o CABG.  He had NSTEMI in 3/20 with DES to ostial/proximal LAD and SVG-RCA.  No chest pain.  - He will continue apixaban long-term for atrial fibrillation/flutter.  - he is on statin and Zetia.  3. Hyperlipidemia: He is taking Crestor 40 mg daily and Zetia 10 mg daily.  Good lipids in 8/21.    4. Ischemic cardiomyopathy/primarily diastolic CHF: Echo in 0000000 with EF 45-50%, echo in 10/20 with EF 50-55%, echo today with EF 60-65%.  NYHA class II symptoms.  He is not volume overloaded on exam.  - Continue Lasix 20 mg daily, recetn BMET ok.   - He is off losartan with elevated creatinine.  5. Pulmonary: PFTs suggested both a restrictive and obstructive defect.  The restrictive defect is likely from post-polio syndrome and elevated left hemidiaphragm.  Mr Leclair never smoked much, so the obstructive defect is more surprising.  6. OSA: Severe, likely potentiates his atrial fibrillation. He has had a hard time tolerating CPAP.   - Now using an oral appliance.   7. CVA: 10/20, in setting of holding anticoagulation for 2 days.  Avoid holding anticoagulation in the future.  Mild residual effect on right hand dexterity.   COVID screen The patient does not have any symptoms that suggest any further testing/ screening at  this time.  Social distancing reinforced today.  Patient Risk: After full review of this patients clinical status, I feel that they are at moderate risk for cardiac decompensation at this time.  Relevant cardiac medications were reviewed at length with the patient today. The patient does not have concerns regarding their medications at this time.   Recommended follow-up:  Next week for 3 months.   Today, I have spent 25 minutes with the patient with telehealth technology discussing the above issues .    Signed, [name], MD  01/08/2021   [place] [address] [facility] [name] [address] [phone] (office) [phone] (fax)

## 2021-01-08 NOTE — Patient Instructions (Signed)
Your physician recommends that you schedule a follow-up appointment in: 1 week for nurse visit with EKG  Your physician recommends that you schedule a follow-up appointment in: 3 months  If you have any questions or concerns before your next appointment please send [place] a message through [place] or call our office at [phone].    TO LEAVE A MESSAGE FOR THE NURSE SELECT OPTION 2, PLEASE LEAVE A MESSAGE INCLUDING: . YOUR NAME . DATE OF BIRTH . CALL BACK NUMBER . REASON FOR CALL**this is important as we prioritize the call backs  [place] AS LONG AS YOU CALL BEFORE 4:00 PM  At the [facility], you and your health needs are our priority. As part of our continuing mission to provide you with exceptional heart care, we have created designated Provider Care Teams. These Care Teams include your primary Cardiologist (physician) and Advanced Practice Providers (APPs- Physician Assistants and Nurse Practitioners) who all work together to provide you with the care you need, when you need it.   You may see any of the following providers on your designated Care Team at your next follow up: [name] Dr Glori Bickers . Dr [name] . Darrick Grinder, NP . Lyda Jester, PA . Audry Riles, PharmD   Please be sure to bring in all your medications bottles to every appointment.

## 2021-01-14 ENCOUNTER — Other Ambulatory Visit: Payer: Self-pay

## 2021-01-14 ENCOUNTER — Other Ambulatory Visit (HOSPITAL_COMMUNITY): Payer: Self-pay | Admitting: *Deleted

## 2021-01-14 ENCOUNTER — Ambulatory Visit (HOSPITAL_COMMUNITY)
Admission: RE | Admit: 2021-01-14 | Discharge: 2021-01-14 | Disposition: A | Discharge status: Home / Self Care | Payer: Medicare Other | Source: Ambulatory Visit | Attending: Cardiology | Admitting: Cardiology

## 2021-01-14 VITALS — BP 144/68 | HR 89 | Resp 16 | Wt 121.1 lb

## 2021-01-14 DIAGNOSIS — I445 Left posterior fascicular block: Secondary | ICD-10-CM | POA: Diagnosis not present

## 2021-01-14 DIAGNOSIS — I480 Paroxysmal atrial fibrillation: Secondary | ICD-10-CM

## 2021-01-14 DIAGNOSIS — I4891 Unspecified atrial fibrillation: Secondary | ICD-10-CM | POA: Diagnosis present

## 2021-01-14 LAB — BASIC METABOLIC PANEL
Anion gap: 14 (ref 5–15)
BUN: 17 mg/dL (ref 8–23)
CO2: 27 mmol/L (ref 22–32)
Calcium: 10.7 mg/dL — ABNORMAL HIGH (ref 8.9–10.3)
Chloride: 100 mmol/L (ref 98–111)
Creatinine, Ser: 1.28 mg/dL — ABNORMAL HIGH (ref 0.61–1.24)
GFR, Estimated: 56 mL/min — ABNORMAL LOW (ref >60)
Glucose, Bld: 78 mg/dL (ref 70–99)
Potassium: 4.5 mmol/L (ref 3.5–5.1)
Sodium: 141 mmol/L (ref 135–145)

## 2021-01-14 LAB — CBC
HCT: 44.8 % (ref 39.0–52.0)
Hemoglobin: 14.5 g/dL (ref 13.0–17.0)
MCH: 29.8 pg (ref 26.0–34.0)
MCHC: 32.4 g/dL (ref 30.0–36.0)
MCV: 92 fL (ref 80.0–100.0)
Platelets: 200 10*3/uL (ref 150–400)
RBC: 4.87 MIL/uL (ref 4.22–5.81)
RDW: 17.4 % — ABNORMAL HIGH (ref 11.5–15.5)
WBC: 8.1 10*3/uL (ref 4.0–10.5)
nRBC: 0 % (ref 0.0–0.2)

## 2021-01-14 NOTE — Patient Instructions (Signed)
Cardioversion Instructions  You are scheduled for a Cardioversion on Wednesday 01/20/21 with [name].  Please arrive at the [facility] (Main Entrance A) at [facility]: [address] [place], [place] [address] at 8:30 am  DIET: Nothing to eat or drink after midnight except a sip of water with medications (see medication instructions below)  Medication Instructions: Hold Furosemide Wed 1/25 AM  Continue your anticoagulant: Eliquis, DO NOT miss any doses  COVID TEST: Monday 01/19/21 at 1:10 PM at:   [place], [place] must have a responsible person to drive you home and stay in the waiting area during your procedure. Failure to do so could result in cancellation.  Bring your insurance cards.  *Special Note: Every effort is made to have your procedure done on time. Occasionally there are emergencies that occur at the hospital that may cause delays. Please be patient if a delay does occur.

## 2021-01-14 NOTE — Progress Notes (Signed)
EKG revealed a-fib, per Dr Aundra Dubin needs DCCV, will arrange

## 2021-01-15 ENCOUNTER — Other Ambulatory Visit (HOSPITAL_COMMUNITY): Payer: Self-pay | Admitting: Cardiology

## 2021-01-19 ENCOUNTER — Other Ambulatory Visit (HOSPITAL_COMMUNITY)
Admission: RE | Admit: 2021-01-19 | Discharge: 2021-01-19 | Disposition: A | Discharge status: Home / Self Care | Payer: Medicare Other | Source: Ambulatory Visit | Attending: Cardiology | Admitting: Cardiology

## 2021-01-19 DIAGNOSIS — Z01812 Encounter for preprocedural laboratory examination: Secondary | ICD-10-CM | POA: Diagnosis present

## 2021-01-19 DIAGNOSIS — Z20822 Contact with and (suspected) exposure to covid-19: Secondary | ICD-10-CM | POA: Insufficient documentation

## 2021-01-20 LAB — SARS CORONAVIRUS 2 (TAT 6-24 HRS): SARS Coronavirus 2: NEGATIVE

## 2021-01-21 ENCOUNTER — Encounter (HOSPITAL_COMMUNITY)
Admission: RE | Disposition: A | Discharge status: Home / Self Care | Payer: Self-pay | Source: Home / Self Care | Attending: Cardiology

## 2021-01-21 ENCOUNTER — Encounter (HOSPITAL_COMMUNITY): Payer: Self-pay | Admitting: Cardiology

## 2021-01-21 ENCOUNTER — Ambulatory Visit (HOSPITAL_COMMUNITY): Payer: Medicare Other | Admitting: Anesthesiology

## 2021-01-21 ENCOUNTER — Ambulatory Visit (HOSPITAL_COMMUNITY)
Admission: RE | Admit: 2021-01-21 | Discharge: 2021-01-21 | Disposition: A | Discharge status: Home / Self Care | Payer: Medicare Other | Attending: Cardiology | Admitting: Cardiology

## 2021-01-21 DIAGNOSIS — I2510 Atherosclerotic heart disease of native coronary artery without angina pectoris: Secondary | ICD-10-CM | POA: Insufficient documentation

## 2021-01-21 DIAGNOSIS — Z951 Presence of aortocoronary bypass graft: Secondary | ICD-10-CM | POA: Diagnosis not present

## 2021-01-21 DIAGNOSIS — Z885 Allergy status to narcotic agent status: Secondary | ICD-10-CM | POA: Diagnosis not present

## 2021-01-21 DIAGNOSIS — E785 Hyperlipidemia, unspecified: Secondary | ICD-10-CM | POA: Insufficient documentation

## 2021-01-21 DIAGNOSIS — Z7901 Long term (current) use of anticoagulants: Secondary | ICD-10-CM | POA: Insufficient documentation

## 2021-01-21 DIAGNOSIS — I484 Atypical atrial flutter: Secondary | ICD-10-CM | POA: Insufficient documentation

## 2021-01-21 DIAGNOSIS — Z8673 Personal history of transient ischemic attack (TIA), and cerebral infarction without residual deficits: Secondary | ICD-10-CM | POA: Diagnosis not present

## 2021-01-21 DIAGNOSIS — Z79899 Other long term (current) drug therapy: Secondary | ICD-10-CM | POA: Insufficient documentation

## 2021-01-21 DIAGNOSIS — I255 Ischemic cardiomyopathy: Secondary | ICD-10-CM | POA: Diagnosis not present

## 2021-01-21 DIAGNOSIS — I4891 Unspecified atrial fibrillation: Secondary | ICD-10-CM | POA: Insufficient documentation

## 2021-01-21 DIAGNOSIS — Z888 Allergy status to other drugs, medicaments and biological substances status: Secondary | ICD-10-CM | POA: Insufficient documentation

## 2021-01-21 DIAGNOSIS — Z87891 Personal history of nicotine dependence: Secondary | ICD-10-CM | POA: Insufficient documentation

## 2021-01-21 DIAGNOSIS — G4733 Obstructive sleep apnea (adult) (pediatric): Secondary | ICD-10-CM | POA: Insufficient documentation

## 2021-01-21 HISTORY — PX: CARDIOVERSION: SHX1299

## 2021-01-21 SURGERY — CARDIOVERSION
Anesthesia: General

## 2021-01-21 MED ORDER — LIDOCAINE HCL (CARDIAC) PF 100 MG/5ML IV SOSY
PREFILLED_SYRINGE | INTRAVENOUS | Status: DC | PRN
  Administered 2021-01-21: 60 mg via INTRAVENOUS

## 2021-01-21 MED ORDER — PROPOFOL 10 MG/ML IV BOLUS
INTRAVENOUS | Status: DC | PRN
  Administered 2021-01-21: 50 mg via INTRAVENOUS

## 2021-01-21 NOTE — Anesthesia Postprocedure Evaluation (Signed)
Anesthesia Post Note  Patient: [name]  Procedure(s) Performed: CARDIOVERSION (N/A )     Patient location during evaluation: Endoscopy Anesthesia Type: General Level of consciousness: awake and alert Pain management: pain level controlled Vital Signs Assessment: post-procedure vital signs reviewed and stable Respiratory status: spontaneous breathing, nonlabored ventilation and respiratory function stable Cardiovascular status: blood pressure returned to baseline and stable Postop Assessment: no apparent nausea or vomiting Anesthetic complications: no   No complications documented.  Last Vitals:  Vitals:   01/21/21 1004 01/21/21 1014  BP: 128/72 (!) 127/52  Pulse: 70 72  Resp: 20 (!) 21  Temp:    SpO2: 98% 99%    Last Pain:  Vitals:   01/21/21 1014  TempSrc:   PainSc: 0-No pain                 Catalina Gravel

## 2021-01-21 NOTE — Procedures (Signed)
Electrical Cardioversion Procedure Note JARL SELLITTO [id number] 1936/07/04  Procedure: Electrical Cardioversion Indications:  Atrial Fibrillation  Procedure Details Consent: Risks of procedure as well as the alternatives and risks of each were explained to the (patient/caregiver).  Consent for procedure obtained. Time Out: Verified patient identification, verified procedure, site/side was marked, verified correct patient position, special equipment/implants available, medications/allergies/relevent history reviewed, required imaging and test results available.  Performed  Patient placed on cardiac monitor, pulse oximetry, supplemental oxygen as necessary.  Sedation given: Propofol per anesthesiology Pacer pads placed anterior and posterior chest.  Cardioverted 1 time(s).  Cardioverted at [facility].  Evaluation Findings: Post procedure EKG shows: NSR with PACs Complications: None Patient did tolerate procedure well.   [name] 01/21/2021, 9:59 AM

## 2021-01-21 NOTE — Interval H&P Note (Signed)
History and Physical Interval Note:  01/21/2021 9:38 AM  [name]  has presented today for surgery, with the diagnosis of AFIB.  The various methods of treatment have been discussed with the patient and family. After consideration of risks, benefits and other options for treatment, the patient has consented to  Procedure(s): CARDIOVERSION (N/A) as a surgical intervention.  The patient's history has been reviewed, patient examined, no change in status, stable for surgery.  I have reviewed the patient's chart and labs.  Questions were answered to the patient's satisfaction.     [name]

## 2021-01-21 NOTE — Transfer of Care (Signed)
Immediate Anesthesia Transfer of Care Note  Patient: [name]  Procedure(s) Performed: CARDIOVERSION (N/A )  Patient Location: Endoscopy Unit  Anesthesia Type:General  Level of Consciousness: awake, alert  and oriented  Airway & Oxygen Therapy: Patient connected to nasal cannula oxygen  Post-op Assessment: Post -op Vital signs reviewed and stable  Post vital signs: stable  Last Vitals:  Vitals Value Taken Time  BP    Temp    Pulse    Resp    SpO2      Last Pain:  Vitals:   01/21/21 0852  TempSrc: Tympanic  PainSc: 0-No pain         Complications: No complications documented.

## 2021-01-21 NOTE — Anesthesia Preprocedure Evaluation (Addendum)
Anesthesia Evaluation  Patient identified by MRN, date of birth, ID band Patient awake    Reviewed: Allergy & Precautions, NPO status , Patient's Chart, lab work & pertinent test results  History of Anesthesia Complications (+) DIFFICULT AIRWAY and history of anesthetic complications  Airway Mallampati: II  TM Distance: <3 FB Neck ROM: Full    Dental  (+) Teeth Intact, Dental Advisory Given   Pulmonary sleep apnea , COPD,  COPD inhaler, former smoker,    Pulmonary exam normal breath sounds clear to auscultation       Cardiovascular hypertension, Pt. on medications (-) angina+ CAD, + Past MI, + Cardiac Stents, + CABG and +CHF  + dysrhythmias Atrial Fibrillation  Rhythm:Irregular Rate:Abnormal     Neuro/Psych CVA, Residual Symptoms    GI/Hepatic Neg liver ROS, GERD  Medicated,  Endo/Other  negative endocrine ROS  Renal/GU negative Renal ROS     Musculoskeletal negative musculoskeletal ROS (+)   Abdominal   Peds  Hematology  (+) Blood dyscrasia (Eliquis), ,   Anesthesia Other Findings Day of surgery medications reviewed with the patient.  Reproductive/Obstetrics                            Anesthesia Physical Anesthesia Plan  ASA: III  Anesthesia Plan: General   Post-op Pain Management:    Induction: Intravenous  PONV Risk Score and Plan: 2 and Propofol infusion and Treatment may vary due to age or medical condition  Airway Management Planned: Mask  Additional Equipment:   Intra-op Plan:   Post-operative Plan:   Informed Consent: I have reviewed the patients History and Physical, chart, labs and discussed the procedure including the risks, benefits and alternatives for the proposed anesthesia with the patient or authorized representative who has indicated his/her understanding and acceptance.     Dental advisory given  Plan Discussed with: CRNA  Anesthesia Plan Comments:          Anesthesia Quick Evaluation

## 2021-01-21 NOTE — Anesthesia Procedure Notes (Signed)
Procedure Name: General with mask airway Date/Time: 01/21/2021 9:35 AM Performed by: Lavell Luster, CRNA Pre-anesthesia Checklist: Patient identified, Emergency Drugs available, Suction available, Patient being monitored and Timeout performed Patient Re-evaluated:Patient Re-evaluated prior to induction Oxygen Delivery Method: Ambu bag Preoxygenation: Pre-oxygenation with 100% oxygen Induction Type: IV induction Dental Injury: Teeth and Oropharynx as per pre-operative assessment

## 2021-01-21 NOTE — Discharge Instructions (Signed)
Electrical Cardioversion Electrical cardioversion is the delivery of a jolt of electricity to restore a normal rhythm to the heart. A rhythm that is too fast or is not regular keeps the heart from pumping well. In this procedure, sticky patches or metal paddles are placed on the chest to deliver electricity to the heart from a device. This procedure may be done in an emergency if:  There is low or no blood pressure as a result of the heart rhythm.  Normal rhythm must be restored as fast as possible to protect the brain and heart from further damage.  It may save a life. This may also be a scheduled procedure for irregular or fast heart rhythms that are not immediately life-threatening. Tell a health care provider about:  Any allergies you have.  All medicines you are taking, including vitamins, herbs, eye drops, creams, and over-the-counter medicines.  Any problems you or family members have had with anesthetic medicines.  Any blood disorders you have.  Any surgeries you have had.  Any medical conditions you have.  Whether you are pregnant or may be pregnant. What are the risks? Generally, this is a safe procedure. However, problems may occur, including:  Allergic reactions to medicines.  A blood clot that breaks free and travels to other parts of your body.  The possible return of an abnormal heart rhythm within hours or days after the procedure.  Your heart stopping (cardiac arrest). This is rare. What happens before the procedure? Medicines  Your health care provider may have you start taking: ? Blood-thinning medicines (anticoagulants) so your blood does not clot as easily. ? Medicines to help stabilize your heart rate and rhythm.  Ask your health care provider about: ? Changing or stopping your regular medicines. This is especially important if you are taking diabetes medicines or blood thinners. ? Taking medicines such as aspirin and ibuprofen. These medicines can  thin your blood. Do not take these medicines unless your health care provider tells you to take them. ? Taking over-the-counter medicines, vitamins, herbs, and supplements. General instructions  Follow instructions from your health care provider about eating or drinking restrictions.  Plan to have someone take you home from the hospital or clinic.  If you will be going home right after the procedure, plan to have someone with you for 24 hours.  Ask your health care provider what steps will be taken to help prevent infection. These may include washing your skin with a germ-killing soap. What happens during the procedure?  An IV will be inserted into one of your veins.  Sticky patches (electrodes) or metal paddles may be placed on your chest.  You will be given a medicine to help you relax (sedative).  An electrical shock will be delivered. The procedure may vary among health care providers and hospitals.   What can I expect after the procedure?  Your blood pressure, heart rate, breathing rate, and blood oxygen level will be monitored until you leave the hospital or clinic.  Your heart rhythm will be watched to make sure it does not change.  You may have some redness on the skin where the shocks were given. Follow these instructions at home:  Do not drive for 24 hours if you were given a sedative during your procedure.  Take over-the-counter and prescription medicines only as told by your health care provider.  Ask your health care provider how to check your pulse. Check it often.  Rest for 48 hours after the procedure   or as told by your health care provider.  Avoid or limit your caffeine use as told by your health care provider.  Keep all follow-up visits as told by your health care provider. This is important. Contact a health care provider if:  You feel like your heart is beating too quickly or your pulse is not regular.  You have a serious muscle cramp that does not go  away. Get help right away if:  You have discomfort in your chest.  You are dizzy or you feel faint.  You have trouble breathing or you are short of breath.  Your speech is slurred.  You have trouble moving an arm or leg on one side of your body.  Your fingers or toes turn cold or blue. Summary  Electrical cardioversion is the delivery of a jolt of electricity to restore a normal rhythm to the heart.  This procedure may be done right away in an emergency or may be a scheduled procedure if the condition is not an emergency.  Generally, this is a safe procedure.  After the procedure, check your pulse often as told by your health care provider. This information is not intended to replace advice given to you by your health care provider. Make sure you discuss any questions you have with your health care provider. Document Revised: 07/16/2019 Document Reviewed: 07/16/2019 Elsevier Patient Education  2021 Elsevier Inc.  

## 2021-01-22 ENCOUNTER — Encounter (HOSPITAL_COMMUNITY): Payer: Self-pay | Admitting: Cardiology

## 2021-01-22 ENCOUNTER — Ambulatory Visit: Payer: Medicare Other | Admitting: Cardiology

## 2021-01-30 ENCOUNTER — Telehealth: Payer: Self-pay | Admitting: Gastroenterology

## 2021-01-30 ENCOUNTER — Other Ambulatory Visit: Payer: Self-pay

## 2021-01-30 DIAGNOSIS — K862 Cyst of pancreas: Secondary | ICD-10-CM

## 2021-01-30 NOTE — Telephone Encounter (Signed)
Patient has been scheduled for MRI at [facility] on 02/16/21 at 9:00.  Patient's wife notified to have him arrive at 8:30 and be NPO after midnight.  Istat creatinine entered.

## 2021-02-16 ENCOUNTER — Other Ambulatory Visit: Payer: Self-pay

## 2021-02-16 ENCOUNTER — Ambulatory Visit (HOSPITAL_COMMUNITY)
Admission: RE | Admit: 2021-02-16 | Discharge: 2021-02-16 | Disposition: A | Discharge status: Home / Self Care | Payer: Medicare Other | Source: Ambulatory Visit | Attending: Gastroenterology | Admitting: Gastroenterology

## 2021-02-16 ENCOUNTER — Other Ambulatory Visit: Payer: Self-pay | Admitting: Gastroenterology

## 2021-02-16 ENCOUNTER — Other Ambulatory Visit (HOSPITAL_COMMUNITY): Payer: Self-pay | Admitting: *Deleted

## 2021-02-16 DIAGNOSIS — K862 Cyst of pancreas: Secondary | ICD-10-CM | POA: Insufficient documentation

## 2021-02-16 MED ORDER — DILTIAZEM HCL ER COATED BEADS 240 MG PO CP24: 240.0000 mg | ORAL_CAPSULE | Freq: Every day | ORAL | 6 refills | Status: DC

## 2021-02-16 MED ORDER — GADOBUTROL 1 MMOL/ML IV SOLN
5.0000 mL | Freq: Once | INTRAVENOUS | Status: AC | PRN
  Administered 2021-02-16: 5 mL via INTRAVENOUS

## 2021-02-26 ENCOUNTER — Encounter: Payer: Self-pay | Admitting: Cardiology

## 2021-02-26 ENCOUNTER — Ambulatory Visit: Payer: Medicare Other | Admitting: Cardiology

## 2021-02-26 ENCOUNTER — Other Ambulatory Visit: Payer: Self-pay

## 2021-02-26 VITALS — BP 146/54 | HR 84 | Ht 66.0 in | Wt 123.4 lb

## 2021-02-26 DIAGNOSIS — I4819 Other persistent atrial fibrillation: Secondary | ICD-10-CM

## 2021-02-26 MED ORDER — DILTIAZEM HCL ER COATED BEADS 360 MG PO CP24: 360.0000 mg | ORAL_CAPSULE | Freq: Every day | ORAL | 3 refills | Status: DC

## 2021-02-26 NOTE — Progress Notes (Signed)
Electrophysiology Office Note   Date:  02/26/2021   ID:  [name], DOB [birth year], MRN [id number]  PCP:  Burnard Bunting, MD  Cardiologist:  Aundra Dubin Primary Electrophysiologist:  Ginette Bradway Meredith Leeds, MD    No chief complaint on file.    History of Present Illness: [name] is a 85 y.o. male who is being seen today for the evaluation of atrial fibrillation/flutter at the request of Burnard Bunting, MD. Presenting today for electrophysiology evaluation.    He has a history significant for persistent atrial fibrillation, coronary artery disease, COPD, CVA, OSA, hypertension, and hyperlipidemia.  He was diagnosed with atrial fibrillation several years ago and was started on dofetilide.  He also has sleep apnea and is on BiPAP.  He is also had atypical atrial flutter.  He is status post atrial fibrillation/flutter ablation 06/08/2019.  During that time he had more episodes of atrial flutter with multiple flutter circuits.  He is now status post repeat ablation 11/19/2020.  Today, denies symptoms of palpitations, chest pain, shortness of breath, orthopnea, PND, lower extremity edema, claudication, dizziness, presyncope, syncope, bleeding, or neurologic sequela. The patient is tolerating medications without difficulties.  He has intermittent shortness of breath and dizziness, but overall feels well.   Past Medical History:  Diagnosis Date  . Abnormal nuclear cardiac imaging test March 2011   Has positive EKG response, no perfusion defect and normal EF  . Adenomatous colon polyp 12/2002  . Arrhythmia   . Atrial fibrillation ([place])    a. s/p rfca;  b. chronic tikosyn and xarelto.  . 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 Cataract    bil cataracts removed  . Chronic diastolic CHF (congestive heart failure) ([place])    a. 04/2017 Echo: EF 65-70%, restrictive physiology, mildly dil LA.  [name] COPD (chronic obstructive pulmonary disease)  ([place])   . Coronary artery disease    First MI in 1988. PCI in 1996 with subsequent CABG in 1996 due to restenosis. S/P stents to LCX in 2009. Noted to have residual disease in the LAD in a diffuse manner and atretic LIMA graft. He is managed medically.   . Difficult intubation    06/08/19 update - Intubated with MAC3 with grade IIb view 7.0 tube passed easily  . Dysrhythmia    Afib  . Esophageal stricture   . GERD (gastroesophageal reflux disease)   . 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 History of Raynaud's syndrome   . Hyperlipidemia   . Hypertension   . Internal hemorrhoids   . Muscle spasm   . Myocardial infarction [facility])    MI x1 41 - 19 age  . Neuromuscular disorder ([place])    Polio - age 73  . OSA (obstructive sleep apnea) 10/16/2018   Severe OSA with AHI 37.2/hr on BiPAP  . Persistent atrial fibrillation ([place])   . Polio    age 71  . PVC (premature ventricular contraction)   . Scoliosis    mild   Past Surgical History:  Procedure Laterality Date  . ABLATION     done for a fib  . APPENDECTOMY     Age 22  . ATRIAL FIBRILLATION ABLATION N/A 06/08/2019   Procedure: ATRIAL FIBRILLATION ABLATION;  Surgeon: Constance Haw, MD;  Location: [place] CV LAB;  Service: Cardiovascular;  Laterality: N/A;  . ATRIAL FIBRILLATION ABLATION N/A 11/19/2020   Procedure: ATRIAL FIBRILLATION ABLATION;  Surgeon: Curt Bears, Jahbari Repinski  Hassell Done, MD;  Location: [place] CV LAB;  Service: Cardiovascular;  Laterality: N/A;  . CARDIAC CATHETERIZATION    . CARDIOVERSION N/A 08/22/2013   Procedure: CARDIOVERSION;  Surgeon: Carlena Bjornstad, MD;  Location: [facility] ENDOSCOPY;  Service: Cardiovascular;  Laterality: N/A;  . CARDIOVERSION N/A 01/16/2014   Procedure: CARDIOVERSION;  Surgeon: Lelon Perla, MD;  Location[facility] ENDOSCOPY;  Service: Cardiovascular;  Laterality: N/A;  . CARDIOVERSION N/A 12/11/2018   Procedure: CARDIOVERSION;  Surgeon: Larey Dresser, MD;  Location: [facility] ENDOSCOPY;  Service:  Cardiovascular;  Laterality: N/A;  . CARDIOVERSION N/A 11/20/2019   Procedure: CARDIOVERSION;  Surgeon: [name], MD;  Location: [facility] ENDOSCOPY;  Service: Endoscopy;  Laterality: N/A;  . CARDIOVERSION N/A 07/10/2020   Procedure: CARDIOVERSION;  Surgeon: Larey Dresser, MD;  Location: [facility] ENDOSCOPY;  Service: Cardiovascular;  Laterality: N/A;  . CARDIOVERSION N/A 01/21/2021   Procedure: CARDIOVERSION;  Surgeon: Larey Dresser, MD;  Location: [facility] ENDOSCOPY;  Service: Cardiovascular;  Laterality: N/A;  . COLONOSCOPY  2008  . CORONARY ARTERY BYPASS GRAFT  1996   with a LIMA to the LAD, SVG to RCA and SVG to OM  . CORONARY STENT INTERVENTION N/A 03/26/2019   Procedure: CORONARY STENT INTERVENTION;  Surgeon: Jettie Booze, MD;  Location: [place] CV LAB;  Service: Cardiovascular;  Laterality: N/A;  . CORONARY STENT PLACEMENT  1996   LCX  . EMBOLECTOMY Right 05/09/2019   Procedure: Embolectomy of right radial artery;  Surgeon: Serafina Mitchell, MD;  Location: [place];  Service: Vascular;  Laterality: Right;  . ESOPHAGEAL DILATION    . EYE SURGERY    . FALSE ANEURYSM REPAIR Right 05/09/2019   Procedure: REPAIR FALSE ANEURYSM RIGHT RADIAL;  Surgeon: Serafina Mitchell, MD;  Location: [place];  Service: Vascular;  Laterality: Right;  . LEFT HEART CATH AND CORS/GRAFTS ANGIOGRAPHY N/A 03/26/2019   Procedure: LEFT HEART CATH AND CORS/GRAFTS ANGIOGRAPHY;  Surgeon: Larey Dresser, MD;  Location: [place] CV LAB;  Service: Cardiovascular;  Laterality: N/A;  . POLYPECTOMY    . TONSILLECTOMY    . UPPER GASTROINTESTINAL ENDOSCOPY     esophegeal dilation     Current Outpatient Medications  Medication Sig Dispense Refill  . acetaminophen (TYLENOL) 500 MG tablet Take 250-500 mg by mouth every 8 (eight) hours as needed for mild pain or moderate pain.     [name] albuterol (PROAIR HFA) 108 (90 BASE) MCG/ACT inhaler Inhale 2 puffs into the lungs every 4 (four) hours as needed for wheezing or  shortness of breath (and prior to exercise). 1 Inhaler 3  . dicyclomine (BENTYL) 10 MG capsule Take 10 mg by mouth 3 (three) times daily as needed for spasms (abdominal pain/spasms).     [name] diltiazem (CARDIZEM CD) 360 MG 24 hr capsule Take 1 capsule (360 mg total) by mouth daily. 30 capsule 3  . dofetilide (TIKOSYN) 250 MCG capsule TAKE 1 CAPSULE BY MOUTH EVERY 12 HOURS 180 capsule 3  . ELIQUIS 2.5 MG TABS tablet TAKE 1 TABLET BY MOUTH TWICE A DAY (Patient taking differently: Take 2.5 mg by mouth 2 (two) times daily. (0800 & 2000)) 180 tablet 1  . ezetimibe (ZETIA) 10 MG tablet TAKE 1 TABLET BY MOUTH EVERY DAY (Patient taking differently: Take 10 mg by mouth at bedtime.) 90 tablet 3  . famotidine (PEPCID) 40 MG tablet Take 1 tablet (40 mg total) by mouth at bedtime. 90 tablet 3  . furosemide (LASIX) 20 MG tablet Take 20 mg  by mouth See admin instructions. Take 1 tablet (20 mg) by mouth scheduled in the morning (0800), may take an additional dose in the afternoon if needed for fluid retention/coughing.    [name] KLOR-CON M20 20 MEQ tablet TAKE 1 TABLET BY MOUTH AT BEDTIME (Patient taking differently: Take 20 mEq by mouth at bedtime.) 90 tablet 3  . lactobacillus acidophilus & bulgar (LACTINEX) chewable tablet Chew 2 tablets by mouth at bedtime.     [name] loperamide (IMODIUM) 2 MG capsule Take 2 mg by mouth See admin instructions. Take 1 capsule (2 mg) by mouth in the morning (0800) & may take an additional dose in the evening if needed for diarrhea/loose stools.    [name] LORazepam (ATIVAN) 0.5 MG tablet Take 0.5 mg by mouth at bedtime.    . methocarbamol (ROBAXIN) 500 MG tablet Take 500 mg by mouth 3 (three) times daily as needed for muscle spasms.     . nitroGLYCERIN (NITROSTAT) 0.4 MG SL tablet Place 1 tablet (0.4 mg total) under the tongue every 5 (five) minutes as needed for chest pain. 25 tablet 0  . Probiotic Product (ALIGN) 4 MG CAPS Take 4 mg by mouth daily. (0800)    . rosuvastatin (CRESTOR) 40 MG tablet  TAKE 1 TABLET BY MOUTH EVERY DAY (Patient taking differently: Take 40 mg by mouth daily at 8 pm.) 90 tablet 3  . Testosterone POWD Apply 1 mL topically daily. Testosterone (AT) 150/0(150mg ) cpd    . traZODone (DESYREL) 50 MG tablet Take 50 mg by mouth at bedtime.    . Vitamin D, Ergocalciferol, (DRISDOL) 50000 UNITS CAPS capsule Take 50,000 Units by mouth every 14 (fourteen) days. Take on 1st and 15th at 8p.     No current facility-administered medications for this visit.    Allergies:   Morphine and related and Tape   Social History:  The patient  reports that he quit smoking about 54 years ago. His smoking use included cigarettes. He has a 2.50 pack-year smoking history. He quit smokeless tobacco use about 72 years ago. He reports that he does not drink alcohol and does not use drugs.   Family History:  The patient's family history includes Cancer in his son; Heart Problems in his brother; Heart disease in his father, maternal grandfather, maternal grandmother, mother, paternal grandfather, and paternal grandmother; Prostate cancer in his paternal uncle.   ROS:  Please see the history of present illness.   Otherwise, review of systems is positive for none.   All other systems are reviewed and negative.   PHYSICAL EXAM: VS:  BP (!) 146/54   Pulse 84   Ht 5\' 6"  (1.676 m)   Wt 123 lb 6.4 oz (56 kg)   SpO2 97%   BMI 19.92 kg/m  , BMI Body mass index is 19.92 kg/m. GEN: Well nourished, well developed, in no acute distress  HEENT: normal  Neck: no JVD, carotid bruits, or masses Cardiac: RRR; no murmurs, rubs, or gallops,no edema  Respiratory:  clear to auscultation bilaterally, normal work of breathing GI: soft, nontender, nondistended, + BS MS: no deformity or atrophy  Skin: warm and dry Neuro:  Strength and sensation are intact Psych: euthymic mood, full affect  EKG:  EKG is ordered today. Personal review of the ekg ordered shows sinus rhythm with intermittent junctional  rhythm  Recent Labs: 12/08/2020: Magnesium 2.5 01/14/2021: BUN 17; Creatinine, Ser 1.28; Hemoglobin 14.5; Platelets 200; Potassium 4.5; Sodium 141    Lipid Panel  Component Value Date/Time   CHOL 131 08/08/2020 1338   TRIG 50 08/08/2020 1338   HDL 52 08/08/2020 1338   CHOLHDL 2.5 08/08/2020 1338   VLDL 10 08/08/2020 1338   LDLCALC 69 08/08/2020 1338     Wt Readings from Last 3 Encounters:  02/26/21 123 lb 6.4 oz (56 kg)  01/21/21 117 lb (53.1 kg)  01/14/21 121 lb 2 oz (54.9 kg)      Other studies Reviewed: Additional studies/ records that were reviewed today include: TTE 12/29/18  Review of the above records today demonstrates:  - Left ventricle: The cavity size was normal. Systolic function was   normal. The estimated ejection fraction was in the range of 55%   to 60%. Hypokinesis of the basalanteroseptal and inferoseptal   myocardium. The study is not technically sufficient to allow   evaluation of LV diastolic function. - Aortic valve: Valve mobility was restricted. Transvalvular   velocity was within the normal range. There was no stenosis.   There was no regurgitation. Mean gradient (S): 8 mm Hg. Valve   area (VTI): 1.28 cm^2. Valve area (Vmax): 1.28 cm^2. Valve area   (Vmean): 1.26 cm^2. - Mitral valve: Transvalvular velocity was within the normal range.   There was no evidence for stenosis. There was mild regurgitation. - Left atrium: The atrium was moderately dilated. - Right ventricle: The cavity size was normal. Wall thickness was   normal. Systolic function was normal. - Tricuspid valve: There was mild regurgitation. - Pulmonary arteries: Systolic pressure was within the normal   range. PA peak pressure: 35 mm Hg (S).   ASSESSMENT AND PLAN:  1.  Persistent atrial fibrillation/atypical atrial flutter: Currently on Xarelto and dofetilide.  High risk medication monitoring.  Status post ablation 05/29/2019.  He had multiple atrial flutter circuits at the time.   He is status post repeat ablation 11/19/2020.  Unfortunately he developed recurrent arrhythmias and is now status post cardioversion.  His heart rates have been fast at times.  He is also had some sinus rhythm.  Due to that, we Derricka Mertz increase diltiazem to 360 mg.  I did tell him that we may not be able to completely control his atrial fibrillation which he understands.  2.  Obstructive sleep apnea: Compliance encouraged  3.  Coronary artery disease: Status post CABG.  No current chest pain.  4.  Hypertension: Mildly elevated but usually well controlled   Current medicines are reviewed at length with the patient today.   The patient does not have concerns regarding his medicines.  The following changes were made today: Increase diltiazem  Labs/ tests ordered today include:  Orders Placed This Encounter  Procedures  . EKG 12-Lead     Disposition:   FU with Kamee Bobst 3 months  Signed, Nazirah Tri Meredith Leeds, MD  02/26/2021 8:52 AM     [facility] [address] [place] [address] [phone] (office) [phone] (fax)

## 2021-02-26 NOTE — Patient Instructions (Signed)
Medication Instructions:  Your physician has recommended you make the following change in your medication:  1. INCREASE Diltiazem to 360 mg once daily  *If you need a refill on your cardiac medications before your next appointment, please call your pharmacy*   Lab Work: None ordered   Testing/Procedures: None ordered   Follow-Up: At [facility], you and your health needs are our priority.  As part of our continuing mission to provide you with exceptional heart care, we have created designated Provider Care Teams.  These Care Teams include your primary Cardiologist (physician) and Advanced Practice Providers (APPs -  Physician Assistants and Nurse Practitioners) who all work together to provide you with the care you need, when you need it.  Your next appointment:   3 month(s)  The format for your next appointment:   In Person  Provider:   Allegra Lai, MD    Thank you for choosing [facility]!!   Trinidad Curet, RN [phone]   Other Instructions

## 2021-03-17 ENCOUNTER — Emergency Department (HOSPITAL_COMMUNITY)
Admission: EM | Admit: 2021-03-17 | Discharge: 2021-03-17 | Disposition: A | Discharge status: Home / Self Care | Payer: Medicare Other | Attending: Emergency Medicine | Admitting: Emergency Medicine

## 2021-03-17 ENCOUNTER — Other Ambulatory Visit: Payer: Self-pay

## 2021-03-17 ENCOUNTER — Emergency Department (HOSPITAL_COMMUNITY): Payer: Medicare Other

## 2021-03-17 DIAGNOSIS — R42 Dizziness and giddiness: Secondary | ICD-10-CM | POA: Diagnosis not present

## 2021-03-17 DIAGNOSIS — R110 Nausea: Secondary | ICD-10-CM | POA: Insufficient documentation

## 2021-03-17 DIAGNOSIS — R5383 Other fatigue: Secondary | ICD-10-CM | POA: Diagnosis not present

## 2021-03-17 DIAGNOSIS — R0789 Other chest pain: Secondary | ICD-10-CM | POA: Insufficient documentation

## 2021-03-17 DIAGNOSIS — I5033 Acute on chronic diastolic (congestive) heart failure: Secondary | ICD-10-CM | POA: Diagnosis not present

## 2021-03-17 DIAGNOSIS — Z79899 Other long term (current) drug therapy: Secondary | ICD-10-CM | POA: Diagnosis not present

## 2021-03-17 DIAGNOSIS — I2510 Atherosclerotic heart disease of native coronary artery without angina pectoris: Secondary | ICD-10-CM | POA: Insufficient documentation

## 2021-03-17 DIAGNOSIS — J449 Chronic obstructive pulmonary disease, unspecified: Secondary | ICD-10-CM | POA: Insufficient documentation

## 2021-03-17 DIAGNOSIS — R0602 Shortness of breath: Secondary | ICD-10-CM | POA: Diagnosis not present

## 2021-03-17 DIAGNOSIS — I110 Hypertensive heart disease with heart failure: Secondary | ICD-10-CM | POA: Diagnosis not present

## 2021-03-17 DIAGNOSIS — Z87891 Personal history of nicotine dependence: Secondary | ICD-10-CM | POA: Diagnosis not present

## 2021-03-17 DIAGNOSIS — I4891 Unspecified atrial fibrillation: Secondary | ICD-10-CM | POA: Insufficient documentation

## 2021-03-17 DIAGNOSIS — Z955 Presence of coronary angioplasty implant and graft: Secondary | ICD-10-CM | POA: Diagnosis not present

## 2021-03-17 DIAGNOSIS — Z7901 Long term (current) use of anticoagulants: Secondary | ICD-10-CM | POA: Diagnosis not present

## 2021-03-17 LAB — CBC WITH DIFFERENTIAL/PLATELET
Abs Immature Granulocytes: 0.02 10*3/uL (ref 0.00–0.07)
Basophils Absolute: 0.1 10*3/uL (ref 0.0–0.1)
Basophils Relative: 1 %
Eosinophils Absolute: 0.1 10*3/uL (ref 0.0–0.5)
Eosinophils Relative: 1 %
HCT: 40.7 % (ref 39.0–52.0)
Hemoglobin: 13.3 g/dL (ref 13.0–17.0)
Immature Granulocytes: 0 %
Lymphocytes Relative: 18 %
Lymphs Abs: 1.4 10*3/uL (ref 0.7–4.0)
MCH: 30.6 pg (ref 26.0–34.0)
MCHC: 32.7 g/dL (ref 30.0–36.0)
MCV: 93.8 fL (ref 80.0–100.0)
Monocytes Absolute: 1.1 10*3/uL — ABNORMAL HIGH (ref 0.1–1.0)
Monocytes Relative: 14 %
Neutro Abs: 5.4 10*3/uL (ref 1.7–7.7)
Neutrophils Relative %: 66 %
Platelets: 240 10*3/uL (ref 150–400)
RBC: 4.34 MIL/uL (ref 4.22–5.81)
RDW: 14.3 % (ref 11.5–15.5)
WBC: 8.1 10*3/uL (ref 4.0–10.5)
nRBC: 0 % (ref 0.0–0.2)

## 2021-03-17 LAB — BRAIN NATRIURETIC PEPTIDE: B Natriuretic Peptide: 197.1 pg/mL — ABNORMAL HIGH (ref 0.0–100.0)

## 2021-03-17 LAB — COMPREHENSIVE METABOLIC PANEL
ALT: 29 U/L (ref 0–44)
AST: 39 U/L (ref 15–41)
Albumin: 4.2 g/dL (ref 3.5–5.0)
Alkaline Phosphatase: 83 U/L (ref 38–126)
Anion gap: 10 (ref 5–15)
BUN: 20 mg/dL (ref 8–23)
CO2: 24 mmol/L (ref 22–32)
Calcium: 10.1 mg/dL (ref 8.9–10.3)
Chloride: 103 mmol/L (ref 98–111)
Creatinine, Ser: 1.6 mg/dL — ABNORMAL HIGH (ref 0.61–1.24)
GFR, Estimated: 43 mL/min — ABNORMAL LOW (ref >60)
Glucose, Bld: 112 mg/dL — ABNORMAL HIGH (ref 70–99)
Potassium: 4.1 mmol/L (ref 3.5–5.1)
Sodium: 137 mmol/L (ref 135–145)
Total Bilirubin: 1.1 mg/dL (ref 0.3–1.2)
Total Protein: 7.7 g/dL (ref 6.5–8.1)

## 2021-03-17 LAB — URINALYSIS, ROUTINE W REFLEX MICROSCOPIC
Bacteria, UA: NONE SEEN
Bilirubin Urine: NEGATIVE
Glucose, UA: NEGATIVE mg/dL
Hgb urine dipstick: NEGATIVE
Ketones, ur: 5 mg/dL — AB
Leukocytes,Ua: NEGATIVE
Nitrite: NEGATIVE
Protein, ur: 30 mg/dL — AB
Specific Gravity, Urine: 1.019 (ref 1.005–1.030)
pH: 5 (ref 5.0–8.0)

## 2021-03-17 LAB — TROPONIN I (HIGH SENSITIVITY)
Troponin I (High Sensitivity): 18 ng/L — ABNORMAL HIGH (ref <18)
Troponin I (High Sensitivity): 19 ng/L — ABNORMAL HIGH (ref <18)

## 2021-03-17 NOTE — ED Notes (Signed)
Pt ambulated without difficulty. Gait steady and even. Oxygen 94% and above throughout ambulation.

## 2021-03-17 NOTE — ED Notes (Signed)
Patient Alert and oriented to baseline. Stable and ambulatory to baseline. Patient verbalized understanding of the discharge instructions.  Patient belongings were taken by the patient.   

## 2021-03-17 NOTE — ED Provider Notes (Signed)
[facility] EMERGENCY DEPARTMENT Provider Note   CSN: [id number] Arrival date & time: 03/17/21  0801     History Chief Complaint  Patient presents with  . Shortness of Breath    [name] is a 85 y.o. male.  HPI Patient is 85 year old male with past medical history significant for CAD s/p stents x3 and CABG, A. fib on Eliquis s/p 2 ablations.  Recently cardioverted in late December. CHF with most recent echo 60-65% done January '22  Patient is presented today with complaints of nausea, shortness of breath and chest pressure that started this morning at approximately 3 AM.  He states that he continued until approximately 6 AM; he arrived in the ER approximately 8 AM.  He states that he does occasionally have episodes of shortness of breath but they are typically exertional.  He states he is taking all of his medications as recommended/prescribed.   Does endorse some disequilibrium which he states resolved as well.  He denies any visual symptoms or vertigo.  He denies any chest pain but states that it felt like he had some pressure in his chest the sensation was nonradiating did not seem to get significantly worse with exertion although his shortness of breath did.  He states he also felt somewhat lightheaded but sat down and felt less fatigued and lightheaded.   No other associate symptoms.  No aggravating mitigating factors apart from as discussed above.      Past Medical History:  Diagnosis Date  . Abnormal nuclear cardiac imaging test March 2011   Has positive EKG response, no perfusion defect and normal EF  . Adenomatous colon polyp 12/2002  . Arrhythmia   . Atrial fibrillation ([place])    a. s/p rfca;  b. chronic tikosyn and xarelto.  . 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 Cataract    bil cataracts removed  . Chronic diastolic CHF (congestive heart failure) ([place])    a. 04/2017 Echo: EF 65-70%,  restrictive physiology, mildly dil LA.  [name] COPD (chronic obstructive pulmonary disease) ([place])   . Coronary artery disease    First MI in 1988. PCI in 1996 with subsequent CABG in 1996 due to restenosis. S/P stents to LCX in 2009. Noted to have residual disease in the LAD in a diffuse manner and atretic LIMA graft. He is managed medically.   . Difficult intubation    06/08/19 update - Intubated with MAC3 with grade IIb view 7.0 tube passed easily  . Dysrhythmia    Afib  . Esophageal stricture   . GERD (gastroesophageal reflux disease)   . 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 History of Raynaud's syndrome   . Hyperlipidemia   . Hypertension   . Internal hemorrhoids   . Muscle spasm   . Myocardial infarction [facility])    MI x1 27 - 79 age  . Neuromuscular disorder ([place])    Polio - age 61  . OSA (obstructive sleep apnea) 10/16/2018   Severe OSA with AHI 37.2/hr on BiPAP  . Persistent atrial fibrillation ([place])   . Polio    age 78  . PVC (premature ventricular contraction)   . Scoliosis    mild    Patient Active Problem List   Diagnosis Date Noted  . Atrial flutter ([place]) 11/13/2019  . Secondary hypercoagulable state ([place]) 11/13/2019  . CVA (cerebral vascular accident) ([place]) 10/20/2019  . Bleeding gums 10/20/2019  .  Unstable angina ([place]) 03/25/2019  . OSA (obstructive sleep apnea) 10/16/2018  . Chronic diastolic (congestive) heart failure ([place]) 05/20/2017  . Allergic rhinitis 11/17/2015  . Acute on chronic diastolic CHF (congestive heart failure), NYHA class 4 ([place]) 01/28/2015  . Chest pain on exertion 11/14/2014  . Cerebral artery occlusion 06/24/2014  . Acid reflux 06/24/2014  . H/O cardiovascular disorder 06/24/2014  . H/O acute poliomyelitis 06/24/2014  . Extrasystole 06/24/2014  . A-fib ([place]) 06/24/2014  . Cerebral artery occlusion with cerebral infarction ([place]) 06/24/2014  . COPD (chronic obstructive pulmonary disease) ([place]) 05/02/2014  . CAFL (chronic airflow  limitation) ([place]) 05/02/2014  . Atrial fibrillation ([place]) 11/11/2013  . CAD (coronary artery disease) 09/01/2011  . Hyperlipidemia 09/01/2011  . Atherosclerosis of coronary artery 09/01/2011  . History of Raynaud's syndrome   . History of post-polio syndrome   . GERD (gastroesophageal reflux disease)   . PVC (premature ventricular contraction)   . Brainstem stroke ([place])   . Scoliosis     Past Surgical History:  Procedure Laterality Date  . ABLATION     done for a fib  . APPENDECTOMY     Age 83  . ATRIAL FIBRILLATION ABLATION N/A 06/08/2019   Procedure: ATRIAL FIBRILLATION ABLATION;  Surgeon: Constance Haw, MD;  Location: [facility] CV LAB;  Service: Cardiovascular;  Laterality: N/A;  . ATRIAL FIBRILLATION ABLATION N/A 11/19/2020   Procedure: ATRIAL FIBRILLATION ABLATION;  Surgeon: Constance Haw, MD;  Location: [facility] CV LAB;  Service: Cardiovascular;  Laterality: N/A;  . CARDIAC CATHETERIZATION    . CARDIOVERSION N/A 08/22/2013   Procedure: CARDIOVERSION;  Surgeon: Carlena Bjornstad, MD;  Location: [facility] ENDOSCOPY;  Service: Cardiovascular;  Laterality: N/A;  . CARDIOVERSION N/A 01/16/2014   Procedure: CARDIOVERSION;  Surgeon: Lelon Perla, MD;  Location: [facility] ENDOSCOPY;  Service: Cardiovascular;  Laterality: N/A;  . CARDIOVERSION N/A 12/11/2018   Procedure: CARDIOVERSION;  Surgeon: Larey Dresser, MD;  Location: [facility] ENDOSCOPY;  Service: Cardiovascular;  Laterality: N/A;  . CARDIOVERSION N/A 11/20/2019   Procedure: CARDIOVERSION;  Surgeon: [name], MD;  Location: [facility] ENDOSCOPY;  Service: Endoscopy;  Laterality: N/A;  . CARDIOVERSION N/A 07/10/2020   Procedure: CARDIOVERSION;  Surgeon: Larey Dresser, MD;  Location: [facility] ENDOSCOPY;  Service: Cardiovascular;  Laterality: N/A;  . CARDIOVERSION N/A 01/21/2021   Procedure: CARDIOVERSION;  Surgeon: Larey Dresser, MD;  Location: [facility] ENDOSCOPY;  Service: Cardiovascular;  Laterality: N/A;  . COLONOSCOPY  2008   . CORONARY ARTERY BYPASS GRAFT  1996   with a LIMA to the LAD, SVG to RCA and SVG to OM  . CORONARY STENT INTERVENTION N/A 03/26/2019   Procedure: CORONARY STENT INTERVENTION;  Surgeon: Jettie Booze, MD;  Location: [facility] CV LAB;  Service: Cardiovascular;  Laterality: N/A;  . CORONARY STENT PLACEMENT  1996   LCX  . EMBOLECTOMY Right 05/09/2019   Procedure: Embolectomy of right radial artery;  Surgeon: Serafina Mitchell, MD;  Location: [facility];  Service: Vascular;  Laterality: Right;  . ESOPHAGEAL DILATION    . EYE SURGERY    . FALSE ANEURYSM REPAIR Right 05/09/2019   Procedure: REPAIR FALSE ANEURYSM RIGHT RADIAL;  Surgeon: Serafina Mitchell, MD;  Location: [facility];  Service: Vascular;  Laterality: Right;  . LEFT HEART CATH AND CORS/GRAFTS ANGIOGRAPHY N/A 03/26/2019   Procedure: LEFT HEART CATH AND CORS/GRAFTS ANGIOGRAPHY;  Surgeon: Larey Dresser, MD;  Location: [facility] CV LAB;  Service: Cardiovascular;  Laterality: N/A;  . POLYPECTOMY    .  TONSILLECTOMY    . UPPER GASTROINTESTINAL ENDOSCOPY     esophegeal dilation       Family History  Problem Relation Age of Onset  . Heart disease Mother   . Heart disease Father   . Heart Problems Brother        heart transplant  . Heart disease Maternal Grandmother   . Heart disease Maternal Grandfather   . Heart disease Paternal Grandmother   . Heart disease Paternal Grandfather   . Prostate cancer Paternal Uncle   . Cancer Son        thymic cancer.  died at age 76  . Colon cancer Neg Hx   . Esophageal cancer Neg Hx   . Pancreatic cancer Neg Hx   . Rectal cancer Neg Hx   . Stomach cancer Neg Hx     Social History   Tobacco Use  . Smoking status: Former Smoker    Packs/day: 0.50    Years: 5.00    Pack years: 2.50    Types: Cigarettes    Quit date: 12/27/1966    Years since quitting: 54.2  . Smokeless tobacco: Former Systems developer    Quit date: Art therapist  . Vaping Use: Never used  Substance Use Topics  . Alcohol use:  No  . Drug use: No    Home Medications Prior to Admission medications   Medication Sig Start Date End Date Taking? Authorizing Provider  acetaminophen (TYLENOL) 500 MG tablet Take 250-500 mg by mouth every 8 (eight) hours as needed for mild pain or moderate pain.     [provider]  albuterol (PROAIR HFA) 108 (90 BASE) MCG/ACT inhaler Inhale 2 puffs into the lungs every 4 (four) hours as needed for wheezing or shortness of breath (and prior to exercise). 06/13/14   Collene Gobble, MD  dicyclomine (BENTYL) 10 MG capsule Take 10 mg by mouth 3 (three) times daily as needed for spasms (abdominal pain/spasms).     [provider]  diltiazem (CARDIZEM CD) 360 MG 24 hr capsule Take 1 capsule (360 mg total) by mouth daily. 02/26/21   Camnitz, Will Hassell Done, MD  dofetilide (TIKOSYN) 250 MCG capsule TAKE 1 CAPSULE BY MOUTH EVERY 12 HOURS 01/16/21   Larey Dresser, MD  ELIQUIS 2.5 MG TABS tablet TAKE 1 TABLET BY MOUTH TWICE A DAY Patient taking differently: Take 2.5 mg by mouth 2 (two) times daily. (0800 & 2000) 10/06/20   Larey Dresser, MD  ezetimibe (ZETIA) 10 MG tablet TAKE 1 TABLET BY MOUTH EVERY DAY Patient taking differently: Take 10 mg by mouth at bedtime. 04/30/20   Larey Dresser, MD  famotidine (PEPCID) 40 MG tablet Take 1 tablet (40 mg total) by mouth at bedtime. 03/31/20   Ladene Artist, MD  furosemide (LASIX) 20 MG tablet Take 20 mg by mouth See admin instructions. Take 1 tablet (20 mg) by mouth scheduled in the morning (0800), may take an additional dose in the afternoon if needed for fluid retention/coughing.    [provider]  KLOR-CON M20 20 MEQ tablet TAKE 1 TABLET BY MOUTH AT BEDTIME Patient taking differently: Take 20 mEq by mouth at bedtime. 06/09/20   Larey Dresser, MD  lactobacillus acidophilus & bulgar (LACTINEX) chewable tablet Chew 2 tablets by mouth at bedtime.     [provider]  loperamide (IMODIUM) 2 MG capsule Take 2 mg by mouth See  admin instructions. Take 1 capsule (2 mg) by mouth in the morning (  0800) & may take an additional dose in the evening if needed for diarrhea/loose stools.    [provider]  LORazepam (ATIVAN) 0.5 MG tablet Take 0.5 mg by mouth at bedtime.    [provider]  methocarbamol (ROBAXIN) 500 MG tablet Take 500 mg by mouth 3 (three) times daily as needed for muscle spasms.     [provider]  nitroGLYCERIN (NITROSTAT) 0.4 MG SL tablet Place 1 tablet (0.4 mg total) under the tongue every 5 (five) minutes as needed for chest pain. 03/27/19 08/08/21  Darrick Grinder D, NP  Probiotic Product (ALIGN) 4 MG CAPS Take 4 mg by mouth daily. (0800)    [provider]  rosuvastatin (CRESTOR) 40 MG tablet TAKE 1 TABLET BY MOUTH EVERY DAY Patient taking differently: Take 40 mg by mouth daily at 8 pm. 05/21/20   Larey Dresser, MD  Testosterone POWD Apply 1 mL topically daily. Testosterone (AT) 150/0(150mg ) cpd    [provider]  traZODone (DESYREL) 50 MG tablet Take 50 mg by mouth at bedtime.    [provider]  Vitamin D, Ergocalciferol, (DRISDOL) 50000 UNITS CAPS capsule Take 50,000 Units by mouth every 14 (fourteen) days. Take on 1st and 15th at 8p.    [provider]    Allergies    Morphine and related and Tape  Review of Systems   Review of Systems  Constitutional: Negative for chills and fever.  HENT: Negative for congestion.   Eyes: Negative for pain.  Respiratory: Positive for chest tightness and shortness of breath. Negative for cough.   Cardiovascular: Negative for chest pain and leg swelling.  Gastrointestinal: Positive for nausea. Negative for abdominal pain, diarrhea and vomiting.  Genitourinary: Negative for dysuria.  Musculoskeletal: Negative for myalgias.  Skin: Negative for rash.  Neurological: Negative for dizziness and headaches.    Physical Exam Updated Vital Signs BP (!) 151/79   Pulse 69   Temp 97.8 F (36.6 C)   Resp  19   Ht 5\' 6"  (1.676 m)   Wt 54.4 kg   SpO2 96%   BMI 19.37 kg/m   Physical Exam Vitals and nursing note reviewed.  Constitutional:      General: He is not in acute distress. HENT:     Head: Normocephalic and atraumatic.     Nose: Nose normal.  Eyes:     General: No scleral icterus. Cardiovascular:     Rate and Rhythm: Normal rate and regular rhythm.     Pulses: Normal pulses.     Heart sounds: Normal heart sounds.  Pulmonary:     Effort: Pulmonary effort is normal. No respiratory distress.     Breath sounds: No wheezing.     Comments: Lung sounds are clear to auscultation all fields.  No tachypnea or increased work of breathing. Abdominal:     Palpations: Abdomen is soft.     Tenderness: There is no abdominal tenderness.  Musculoskeletal:     Cervical back: Normal range of motion.     Right lower leg: No edema.     Left lower leg: No edema.     Comments: No appreciable lower extremity edema.  Skin:    General: Skin is warm and dry.     Capillary Refill: Capillary refill takes less than 2 seconds.  Neurological:     Mental Status: He is alert. Mental status is at baseline.  Psychiatric:        Mood and Affect: Mood normal.  Behavior: Behavior normal.     ED Results / Procedures / Treatments   Labs (all labs ordered are listed, but only abnormal results are displayed) Labs Reviewed  CBC WITH DIFFERENTIAL/PLATELET - Abnormal; Notable for the following components:      Result Value   Monocytes Absolute 1.1 (*)    All other components within normal limits  COMPREHENSIVE METABOLIC PANEL - Abnormal; Notable for the following components:   Glucose, Bld 112 (*)    Creatinine, Ser 1.60 (*)    GFR, Estimated 43 (*)    All other components within normal limits  BRAIN NATRIURETIC PEPTIDE - Abnormal; Notable for the following components:   B Natriuretic Peptide 197.1 (*)    All other components within normal limits  URINALYSIS, ROUTINE W REFLEX MICROSCOPIC -  Abnormal; Notable for the following components:   Ketones, ur 5 (*)    Protein, ur 30 (*)    All other components within normal limits  TROPONIN I (HIGH SENSITIVITY) - Abnormal; Notable for the following components:   Troponin I (High Sensitivity) 18 (*)    All other components within normal limits  TROPONIN I (HIGH SENSITIVITY) - Abnormal; Notable for the following components:   Troponin I (High Sensitivity) 19 (*)    All other components within normal limits    EKG EKG Interpretation  Date/Time:  Tuesday March 17 2021 10:27:55 EDT Ventricular Rate:  75 PR Interval:    QRS Duration: 106 QT Interval:  399 QTC Calculation: 446 R Axis:   79 Text Interpretation: Undetermined rhythm Confirmed by Dene Gentry [phone]) on 03/17/2021 10:32:45 AM   Radiology DG Chest 2 View  Result Date: 03/17/2021 CLINICAL DATA:  Shortness of breath EXAM: CHEST - 2 VIEW COMPARISON:  March 25, 2019 chest radiograph FINDINGS: There is persistent elevation of the left hemidiaphragm. There is a small pleural effusion on each side. There is mild left base atelectasis. There is stable right apical scarring. Lungs elsewhere are clear. Heart is upper normal in size with pulmonary vascularity normal. Patient is status post coronary artery bypass grafting. There is aortic atherosclerosis. No bone lesions. There is calcification in the left carotid artery. IMPRESSION: Small pleural effusion on each side. Stable elevation left hemidiaphragm. Mild left base atelectasis. Mild scarring right apex. No consolidation or edema. Heart upper normal in size. Status post coronary artery bypass grafting. Aortic Atherosclerosis (ICD10-I70.0). There is left carotid artery calcification. Electronically Signed   By: Lowella Grip III M.D.   On: 03/17/2021 08:40    Procedures Procedures   Medications Ordered in ED Medications - No data to display  ED Course  I have reviewed the triage vital signs and the nursing  notes.  Pertinent labs & imaging results that were available during my care of the patient were reviewed by me and considered in my medical decision making (see chart for details).    MDM Rules/Calculators/A&P                          Patient is an 85 year old male with a past medical history that is significant for CAD and A. fib on Eliquis.  Please see HPI for full details.  Patient is presented today for nausea, weakness and disequilibrium, chest pressure and shortness of breath that began this morning and resolved before he left for the emergency department.  He is here to make sure that he did not have another heart attack.  He has a history of similar.  His symptoms in the past have not presented this way during his ACS episodes.  Patient denies any symptoms presently including any chest pain or shortness of breath.  He was ambulated with SPO2 and did not desaturate.  Stay between 94 and 98% on room air.  Physical exam is unremarkable.  Patient has no lower extremity edema no JVP and no crackles in the bases.  Patient is chest x-ray does show some small amount of pleural effusion in bilateral bases.  No other acute abnormalities.  Troponin x2 with no significant delta.  BNP lower than it has been on prior ED evaluations.  Urinalysis unremarkable apart from some ketones and protein he will follow-up with PCP for this.  CBC without leukocytosis or anemia.  CMP with no significant abnormalities--creatinine with no significant changes from baseline it is 1.6 today it has been 1.5 several months ago.  Again will follow up with PCP for reevaluation and continued monitoring of this.  EKG without any significant change.  I reviewed the EKG, troponins, history with Dr. Harrell Gave cardiology who recommends continued follow-up and return precautions.  She is in agreement with my plan to discharge.  Do not believe that this patient will benefit from admission given that he is asymptomatic at this  time.  Last cath was 03/26/19 which showed:   Ost LAD to Mid LAD lesion is 80% stenosed.  A drug-eluting stent was successfully placed using a STENT SYNERGY DES 2.75X38, postdilated to 3 mm.  Post intervention, there is a 0% residual stenosis.  Mid RCA lesion is 100% stenosed. SVG to RCA with 80% lesion.  A drug-eluting stent was successfully placed using a STENT SYNERGY DES 3X16. At the proximal edge of the stent, An overlapping drug-eluting stent was successfully placed using a STENT SYNERGY DES 2.75X8.  Post intervention, there is a 0% residual stenosis.  Plan at that time was aggressive secondary prevention.  Patient agreeable to plan to discharge.  Him and wife understanding of strict return precautions.  All questions answered best my ability.  Final Clinical Impression(s) / ED Diagnoses Final diagnoses:  Shortness of breath  Chest pressure    Rx / DC Orders ED Discharge Orders    None       [name], [place] 03/17/21 1426    Valarie Merino, MD 03/18/21 1444

## 2021-03-17 NOTE — Discharge Instructions (Addendum)
Please continue take your medications as prescribed.  Please follow-up with your primary care doctor as well as her cardiologist. Please return to the ER she experience any new or concerning symptoms--as we discussed

## 2021-03-17 NOTE — ED Triage Notes (Signed)
C/O nausea and shortness of breathe more than usual started this morning at rest at 0500. Stated he has hx of COPD and prior heart stents and extensive cardiac hx.

## 2021-03-27 ENCOUNTER — Other Ambulatory Visit: Payer: Self-pay | Admitting: Gastroenterology

## 2021-04-09 ENCOUNTER — Encounter (HOSPITAL_COMMUNITY): Payer: Self-pay | Admitting: Cardiology

## 2021-04-09 ENCOUNTER — Other Ambulatory Visit (HOSPITAL_COMMUNITY): Payer: Self-pay

## 2021-04-09 ENCOUNTER — Ambulatory Visit (HOSPITAL_COMMUNITY)
Admission: RE | Admit: 2021-04-09 | Discharge: 2021-04-09 | Disposition: A | Discharge status: Home / Self Care | Payer: Medicare Other | Source: Ambulatory Visit | Attending: Cardiology | Admitting: Cardiology

## 2021-04-09 ENCOUNTER — Other Ambulatory Visit: Payer: Self-pay

## 2021-04-09 VITALS — BP 130/60 | HR 61 | Wt 123.8 lb

## 2021-04-09 DIAGNOSIS — Z79899 Other long term (current) drug therapy: Secondary | ICD-10-CM | POA: Diagnosis not present

## 2021-04-09 DIAGNOSIS — Z8673 Personal history of transient ischemic attack (TIA), and cerebral infarction without residual deficits: Secondary | ICD-10-CM | POA: Insufficient documentation

## 2021-04-09 DIAGNOSIS — I2510 Atherosclerotic heart disease of native coronary artery without angina pectoris: Secondary | ICD-10-CM | POA: Insufficient documentation

## 2021-04-09 DIAGNOSIS — Z8249 Family history of ischemic heart disease and other diseases of the circulatory system: Secondary | ICD-10-CM | POA: Diagnosis not present

## 2021-04-09 DIAGNOSIS — Z885 Allergy status to narcotic agent status: Secondary | ICD-10-CM | POA: Insufficient documentation

## 2021-04-09 DIAGNOSIS — G4733 Obstructive sleep apnea (adult) (pediatric): Secondary | ICD-10-CM | POA: Diagnosis not present

## 2021-04-09 DIAGNOSIS — Z87891 Personal history of nicotine dependence: Secondary | ICD-10-CM | POA: Diagnosis not present

## 2021-04-09 DIAGNOSIS — Z951 Presence of aortocoronary bypass graft: Secondary | ICD-10-CM | POA: Diagnosis not present

## 2021-04-09 DIAGNOSIS — I5032 Chronic diastolic (congestive) heart failure: Secondary | ICD-10-CM | POA: Insufficient documentation

## 2021-04-09 DIAGNOSIS — I4891 Unspecified atrial fibrillation: Secondary | ICD-10-CM | POA: Insufficient documentation

## 2021-04-09 DIAGNOSIS — E785 Hyperlipidemia, unspecified: Secondary | ICD-10-CM | POA: Diagnosis not present

## 2021-04-09 DIAGNOSIS — Z955 Presence of coronary angioplasty implant and graft: Secondary | ICD-10-CM | POA: Insufficient documentation

## 2021-04-09 DIAGNOSIS — I255 Ischemic cardiomyopathy: Secondary | ICD-10-CM | POA: Insufficient documentation

## 2021-04-09 DIAGNOSIS — Z7901 Long term (current) use of anticoagulants: Secondary | ICD-10-CM | POA: Diagnosis not present

## 2021-04-09 DIAGNOSIS — I4819 Other persistent atrial fibrillation: Secondary | ICD-10-CM | POA: Diagnosis not present

## 2021-04-09 DIAGNOSIS — I252 Old myocardial infarction: Secondary | ICD-10-CM | POA: Diagnosis not present

## 2021-04-09 DIAGNOSIS — I484 Atypical atrial flutter: Secondary | ICD-10-CM | POA: Insufficient documentation

## 2021-04-09 LAB — BASIC METABOLIC PANEL WITH GFR
Anion gap: 9 (ref 5–15)
BUN: 18 mg/dL (ref 8–23)
CO2: 27 mmol/L (ref 22–32)
Calcium: 10 mg/dL (ref 8.9–10.3)
Chloride: 101 mmol/L (ref 98–111)
Creatinine, Ser: 1.69 mg/dL — ABNORMAL HIGH (ref 0.61–1.24)
GFR, Estimated: 40 mL/min — ABNORMAL LOW
Glucose, Bld: 103 mg/dL — ABNORMAL HIGH (ref 70–99)
Potassium: 3.9 mmol/L (ref 3.5–5.1)
Sodium: 137 mmol/L (ref 135–145)

## 2021-04-09 LAB — MAGNESIUM: Magnesium: 2.3 mg/dL (ref 1.7–2.4)

## 2021-04-09 MED ORDER — EMPAGLIFLOZIN 10 MG PO TABS: 10.0000 mg | ORAL_TABLET | Freq: Every day | ORAL | 6 refills | Status: DC

## 2021-04-09 NOTE — Patient Instructions (Signed)
Start Jardiance 10 mg Daily  Your provider has prescribed Jardiance for you. Please be aware the most common side effect of this medication is urinary tract infections and yeast infections. Please practice good hygiene and keep this area clean and dry to help prevent this. If you do begin to have symptoms of these infections, such as difficulty urinating or painful urination,  please let us know.  Labs done today, your results will be available in MyChart, we will contact you for abnormal readings.  Your physician recommends that you return for lab work in: 10-14 days  Your physician recommends that you schedule a follow-up appointment in: 3 months  If you have any questions or concerns before your next appointment please send [place] a message through [place] or call our office at [phone].    TO LEAVE A MESSAGE FOR THE NURSE SELECT OPTION 2, PLEASE LEAVE A MESSAGE INCLUDING: . YOUR NAME . DATE OF BIRTH . CALL BACK NUMBER . REASON FOR CALL**this is important as we prioritize the call backs  [place] AS LONG AS YOU CALL BEFORE 4:00 PM  At the [facility], you and your health needs are our priority. As part of our continuing mission to provide you with exceptional heart care, we have created designated Provider Care Teams. These Care Teams include your primary Cardiologist (physician) and Advanced Practice Providers (APPs- Physician Assistants and Nurse Practitioners) who all work together to provide you with the care you need, when you need it.   You may see any of the following providers on your designated Care Team at your next follow up: [name] Dr Glori Bickers . Dr [name] . Dr Vickki Muff . Darrick Grinder, NP . Lyda Jester, [place] . Audry Riles, PharmD   Please be sure to bring in all your medications bottles to every appointment.

## 2021-04-09 NOTE — Progress Notes (Signed)
ID:  [name], DOB September 07, 1936, MRN [id number]  Provider location: [facility] Type of Visit: Established patient  PCP:  Burnard Bunting, MD  Cardiologist:  [name]   History of Present Illness: [name] is a 85 y.o. male who has history of CAD s/p CABG.  Cath in 11/09 showed that the SVG-distal RCA was patent, the CFX system was patent.  His LIMA was atretic and there were serial 60% and 80% stenoses in the native LAD.  He was managed medically.  Echo in 8/14 showed EF 55-60% with moderate MR and moderate TR.   He was initially noted to have atrial fibrillation in the summer of 2014. He was started on Xarelto and cardioverted to NSR in 8/14.  Recurrent atrial fibrillation was noted in 1/15, and he was cardioverted to NSR again.  This time, NSR did not hold long. By 5/15, he was in persistent atrial fibrillation.  I referred him to [facility] where he had atrial fibrillation ablation and Tikosyn loading.  Ranolazine was stopped due to risk of QT prolongation and Imdur was started as an anti-anginal instead.  Lexiscan Cardiolite in 11/15 showed no ischemia.    CTA chest done in 2/16 to look for evidence for PV stenosis post-AF ablation.  This showed mild short-segment narrowing of the left inferior pulmonary vein.   Patient was admitted in 5/18 with acute on chronic diastolic CHF.  He had been at the beach for [place] days and ate out a number of times, probably getting a significant sodium load.  No chest pain.  He was in normal sinus rhythm.  He was started on IV Lasix and diuresed.  Echo in 5/18 showed EF 65-70% with moderately dilated RV and normal systolic function, PASP 57 mmHg.  Lexiscan Cardiolite in 6/18 showed no significant perfusion defect.   He was admitted with NSTEMI in 3/20, had DES to ostial LAD and mid SVG-RCA.  Echo showed EF 45-50%.  He was in atypical atrial flutter persistently despite Tikosyn.  After his intervention, he was noted to  have a radial artery pseudoaneurysm that was repaired by Dr. Trula Slade.   In 6/20, he had a redo atrial fibrillation ablation.  He was also noted to have complex flutter from multiple foci that was not ablated.  After the procedure, he has had considerable pain in the right leg at the cath site but also radiating down the leg, groin US showed no AV fistula or pseudoaneurysm.    In 10/20, patient held anticoagulation for 2 days for oral surgery and had a CVA presenting as right hand weakness.  He did not get tPA.  Echo in 10/20 showed EF 50-55%, mild LVH, moderately decreased RV function, small PFO. Carotid dopplers showed 1-39% bilateral stenosis. He was switched from Xarelto to Eliquis.   In 11/20, he went into persistent atrial fibrillation and underwent DCCV.  In 7/21, he went into atypical flutter and had DCCV to NSR.  He had a repeat atrial fibrillation ablation in 11/21.   Echo in 1/22 showed EF 60-65%, normal RV, mild-moderate MR. DCCV to NSR in 1/22.   He returns for followup of CHF, atrial fibrillation, and CAD.  He is in NSR today.  He has been taking an extra 20 mg Lasix fairly frequently due to ankle swelling.  No palpitations.  He has episodes of dyspnea that will last for 15-60 minutes, occur randomly.  He thinks these may be spells of  atrial fibrillation.  Generally does ok.  No dyspnea walking on flat ground.  Mild dyspnea with stairs.  No chest pain.  No orthopnea/PND.  Wearing oral appliance for CPAP.   ECG (personally reviewed):NSR, QTc 440 msec  Labs (8/12): K 4.1, creatinine 1.2, LDL 91, HDL 53 Labs (8/14): K 4.6, creatinine 1.1 Labs (11/14): K 4.6, creatinine 1.2, LDL 91, HDL 56 Labs (3/15): AST 38, ALT 32, TSH normal, BNP 237 Labs (7/15): LDL 96, HDL 48, K 4.2, creatinine 1.2, TSH normal, BNP 237, AST 38, ALT 32 Labs (8/15): LFTs normal Labs (11/15): K 4.2, creatinine 1.1, Mg 2.2, BNP 227 Labs (2/16): K 4, creatinine 1.32, BNP 261 Labs (6/18): K 3.9, creatinine 1.6 Labs  (7/18): K 3.9, creatinine 1.5, BNP 242 Labs (12/18): K 3.8, creatinine 1.4, hgb 13.9, LDL 73, HDL 52 Labs (3/19): K 3.7, creatinine 1.4, LDL 76, HDL 42 Labs (6/19): K 4.1, creatinine 1.63 Labs (3/20): K 4.2, creatinine 1.29, LDL 53, HDL 47, hgb 13.8 Labs (4/20): K 4.3, creatinine 1.5 Labs (6/20): K 4.2, creatinine 1.84, hgb 12.4 Labs (7/20): K 4.5, creatinine 2.53 => 1.72 Labs (8/20): K 4.7, creatinine 1.5, Mg 2.4 Labs (12/20): LDL 79, K 4.9, creatinine 1.4 Labs (2/21): K 4.5, creatinine 1.56 Labs (8/21): K 4, creatinine 1.4, LDL 69, HDL 52 Labs (12/21): K 4.4, creatinine 1.21, LDL 85 Labs (3/22): K 4.1, creatinine 1.6, hgb 13.3, BNP 197  PMH: 1. CAD: 1st MI in 1988.  CABG 1996.  PCI to CFX in 2009.  LHC (11/09) SVG-dRCA patent, total occlusion RCA, patent CFX stent, atretic LIMA, serial 60 and 80% proximal LAD stenoses, EF 60% with basal inferior hypokinesis.  Myoview in 2011 with no ischemia or infarction.  Echo (10/12) with EF 55-60%, mild LVH, mild MR.  Lexiscan Cardiolite in 2013 with no ischemia or infarction.  Echo (8/14) with EF 55-60%, moderate MR, moderate TR, PA systolic pressure 35 mmHg.  Echo (4/15) with EF 60-65%, mild focal basal septal hypertrophy, inferior basal akinesis, mild MR.  Lexiscan cardiolite (11/15) with EF 61%, fixed basal inferoseptal defect, no ischemia.  - Lexiscan Cardiolite (6/18): EF 54%, no perfusion defect.  - NSTEMI 3/20, cath showed 95% ostial/proximal LAD, 60-70% mid LAD, totally occluded SVG-LCx, totally occluded RCA, 80-90% mid SVG-RCA.  Patient had DES to ostial-mid LAD and DES to SVG-RCA.  2. Raynauds syndrome 3. Post-polio syndrome 4. GERD with dilation of esophageal stricture in 12/12.  5. Hyperlipidemia 6. H/o CVAs: Brainstem stroke with Horner's syndrome.  Recurrent stroke in 10/20 with right hand weakness when off anticoagulation for 2 days.  - Carotid dopplers (10/20) with minimal stenosis.  7. Scoliosis. 8. H/o appendectomy 9. Herpes  Zoster 10. Atrial fibrillation/atypical atrial flutter: DCCV to NSR in 8/14. DCCV to NSR in 1/15. Atrial fibrillation ablation 6/15 with Tikosyn loading (at [facility]).   - Redo atrial fibrillation ablation in 6/20.  Patient also noted to have complex flutter with several foci, not ablated.  - Zio patch (7/20): Primarily NSR with a few short SVT runs.  - DCCV 11/20.  - DCCV 7/21 - Redo atrial fibrillation ablation 11/21.  - DCCV 1/22 11. PFTs (4/15) with FVC 59%, FEV1 54%, ratio 91%, DLCO 53% => moderate obstructive defect thought to be related to COPD and severe restrictive defect thought to be due to elevated left hemidiaphragm and post-polio syndrome.  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  Echo (5/18) with EF 65-70%, RV moderately dilated with normal systolic function, PASP 57 mmHg.  - Echo (3/20): EF  45-50%, mild LV dilation, basal inferolateral and inferior hypokinesis.  - Echo (10/20): EF 50-55%, mild LVH, basal inferior and inferoseptal hypokinesis, moderately decreased RV systolic function, small PFO.  - Echo (1/22): EF 60-65%, normal RV, mild-moderate MR.  13. CKD: Stage 3.  14. OSA: dental appliance.  15. Right radial artery pseudoaneurysm: post-cath in 3/20, s/p repair.  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 acetaminophen (TYLENOL) 500 MG tablet Take 250-500 mg by mouth every 8 (eight) hours as needed for mild pain or moderate pain.     [name] albuterol (PROAIR HFA) 108 (90 BASE) MCG/ACT inhaler Inhale 2 puffs into the lungs every 4 (four) hours as needed for wheezing or shortness of breath (and prior to exercise). 1 Inhaler 3  . dicyclomine (BENTYL) 10 MG capsule Take 10 mg by mouth 3 (three) times daily as needed for spasms (abdominal pain/spasms).     [name] diltiazem (CARDIZEM CD) 360 MG 24 hr capsule Take 360 mg by mouth at bedtime.    . dofetilide (TIKOSYN) 250 MCG capsule TAKE 1 CAPSULE BY MOUTH EVERY 12 HOURS 180 capsule 3  . ELIQUIS  2.5 MG TABS tablet TAKE 1 TABLET BY MOUTH TWICE A DAY 180 tablet 1  . empagliflozin (JARDIANCE) 10 MG TABS tablet Take 1 tablet (10 mg total) by mouth daily before breakfast. 30 tablet 6  . ezetimibe (ZETIA) 10 MG tablet TAKE 1 TABLET BY MOUTH EVERY DAY 90 tablet 3  . famotidine (PEPCID) 40 MG tablet TAKE 1 TABLET BY MOUTH EVERYDAY AT BEDTIME 90 tablet 0  . furosemide (LASIX) 20 MG tablet Take 20 mg by mouth See admin instructions. Take 1 tablet (20 mg) by mouth scheduled in the morning (0800), may take an additional dose in the afternoon if needed for fluid retention/coughing.    [name] KLOR-CON M20 20 MEQ tablet TAKE 1 TABLET BY MOUTH AT BEDTIME 90 tablet 3  . lactobacillus acidophilus & bulgar (LACTINEX) chewable tablet Chew 2 tablets by mouth at bedtime.     [name] loperamide (IMODIUM) 2 MG capsule Take 2 mg by mouth See admin instructions. Take 1 capsule (2 mg) by mouth in the morning (0800) & may take an additional dose in the evening if needed for diarrhea/loose stools.    [name] LORazepam (ATIVAN) 0.5 MG tablet Take 0.5 mg by mouth at bedtime.    . methocarbamol (ROBAXIN) 500 MG tablet Take 500 mg by mouth 3 (three) times daily as needed for muscle spasms.     . nitroGLYCERIN (NITROSTAT) 0.4 MG SL tablet Place 1 tablet (0.4 mg total) under the tongue every 5 (five) minutes as needed for chest pain. 25 tablet 0  . Probiotic Product (ALIGN) 4 MG CAPS Take 4 mg by mouth daily. (0800)    . rosuvastatin (CRESTOR) 40 MG tablet TAKE 1 TABLET BY MOUTH EVERY DAY 90 tablet 3  . Testosterone POWD Apply 1 mL topically daily. Testosterone (AT) 150/0(150mg ) cpd    . traZODone (DESYREL) 50 MG tablet Take 50 mg by mouth at bedtime.    . Vitamin D, Ergocalciferol, (DRISDOL) 50000 UNITS CAPS capsule Take 50,000 Units by mouth every 14 (fourteen) days. Take on 1st and 15th at 8p.     No current facility-administered medications for this encounter.    Allergies:   Morphine and related and Tape   Social History:  The  patient  reports that he quit smoking about 54 years ago. His smoking use included cigarettes. He  has a 2.50 pack-year smoking history. He quit smokeless tobacco use about 72 years ago. He reports that he does not drink alcohol and does not use drugs.   Family History:  The patient's family history includes Cancer in his son; Heart Problems in his brother; Heart disease in his father, maternal grandfather, maternal grandmother, mother, paternal grandfather, and paternal grandmother; Prostate cancer in his paternal uncle.   ROS:  Please see the history of present illness.   All other systems are personally reviewed and negative.   Exam:   BP 130/60   Pulse 61   Wt 56.2 kg (123 lb 12.8 oz)   SpO2 97%   BMI 19.98 kg/m   Exam:   General: NAD Neck: JVP 8 cm, no thyromegaly or thyroid nodule.  Lungs: Clear to auscultation bilaterally with normal respiratory effort. CV: Nondisplaced PMI.  Heart regular S1/S2, no S3/S4, no murmur.  Trace ankle edema with varicosities.  No carotid bruit.  Normal pedal pulses.  Abdomen: Soft, nontender, no hepatosplenomegaly, no distention.  Skin: Intact without lesions or rashes.  Neurologic: Alert and oriented x 3.  Psych: Normal affect. Extremities: No clubbing or cyanosis.  HEENT: Normal.   Recent Labs: 03/17/2021: ALT 29; B Natriuretic Peptide 197.1; Hemoglobin 13.3; Platelets 240 04/09/2021: BUN 18; Creatinine, Ser 1.69; Magnesium 2.3; Potassium 3.9; Sodium 137  Personally reviewed   Wt Readings from Last 3 Encounters:  04/09/21 56.2 kg (123 lb 12.8 oz)  03/17/21 54.4 kg (120 lb)  02/26/21 56 kg (123 lb 6.4 oz)      ASSESSMENT AND PLAN:  1. Atrial fibrillation/flutter: He has been on Tikosyn and is s/p atrial fibrillation ablation at [facility] on 06/25/14.  He has also had atypical atrial flutter. He had a redo atrial fibrillation ablation in 6/20.  Complex flutter was noted from several foci and was not ablated.  Zio patch from 7/20 showed predominantly  NSR with short runs SVT.  DCCV in 11/20, DCCV again in 7/21 and in 1/22. Redo atrial fibrillation ablation in 11/21.  He is in NSR today, thinks that he has occasional transient AF runs.  - Continue Tikosyn for now.  QTc today is acceptable (440 msec). BMET and Mg good recently.  He does not want to switch to amiodarone due to side effect risk, and I do not think that multaq would be likely to be effective if Tikosyn is not working.  - Continue apixaban, this is appropriately dosed at 2.5 mg bid with age and low weight.   - He is now off nebivolol (tolerated poorly) and on diltiazem for rate control.   2. CAD: H/o CABG.  He had NSTEMI in 3/20 with DES to ostial/proximal LAD and SVG-RCA.  No chest pain.  - He will continue apixaban long-term for atrial fibrillation/flutter.  - he is on statin and Zetia.  3. Hyperlipidemia: He is taking Crestor 40 mg daily and Zetia 10 mg daily.  Good lipids in 8/21.    4. Ischemic cardiomyopathy/primarily diastolic CHF: Echo in 1/06 with EF 45-50%, echo in 10/20 with EF 50-55%, echo in 1/22 with EF 60-65%.  NYHA class II symptoms.  Probable mild volume overload on exam, takes extra Lasix a couple times/week generally.  - Continue Lasix 20 mg daily, BMET today.   - Rather than taking extra Lasix, would like him to start Jardiance 10 mg daily. BMET 10 days. - He is off losartan with elevated creatinine.  5. Pulmonary: PFTs suggested both a restrictive and obstructive defect.  The restrictive defect is likely from post-polio syndrome and elevated left hemidiaphragm.  Mr Moffa never smoked much, so the obstructive defect is more surprising.  6. OSA: Severe, likely potentiates his atrial fibrillation. He has had a hard time tolerating CPAP.   - Now using an oral appliance.   7. CVA: 10/20, in setting of holding anticoagulation for 2 days.  Avoid holding anticoagulation in the future.  Mild residual effect on right hand dexterity.   Followup in 3 months.     Signed, [name], MD  04/09/2021   [place] [address] [facility] [place] [address] [phone] (office) [phone] (fax)

## 2021-04-14 ENCOUNTER — Other Ambulatory Visit: Payer: Self-pay

## 2021-04-14 ENCOUNTER — Telehealth: Payer: Self-pay | Admitting: Gastroenterology

## 2021-04-14 MED ORDER — DICYCLOMINE HCL 10 MG PO CAPS: 10.0000 mg | ORAL_CAPSULE | Freq: Three times a day (TID) | ORAL | 2 refills | Status: DC | PRN

## 2021-04-14 NOTE — Telephone Encounter (Signed)
Prescription refilled per patient's request.

## 2021-04-14 NOTE — Telephone Encounter (Signed)
Pt needs to rf dicyclomine. He stated that Dr. Fuller Plan prescribed it before. His pharmacy is [facility] [address].

## 2021-04-20 ENCOUNTER — Other Ambulatory Visit: Payer: Self-pay

## 2021-04-20 ENCOUNTER — Ambulatory Visit (HOSPITAL_COMMUNITY)
Admission: RE | Admit: 2021-04-20 | Discharge: 2021-04-20 | Disposition: A | Discharge status: Home / Self Care | Payer: Medicare Other | Source: Ambulatory Visit | Attending: Cardiology | Admitting: Cardiology

## 2021-04-20 DIAGNOSIS — I5032 Chronic diastolic (congestive) heart failure: Secondary | ICD-10-CM | POA: Diagnosis not present

## 2021-04-20 LAB — BASIC METABOLIC PANEL
Anion gap: 10 (ref 5–15)
BUN: 20 mg/dL (ref 8–23)
CO2: 24 mmol/L (ref 22–32)
Calcium: 9.7 mg/dL (ref 8.9–10.3)
Chloride: 102 mmol/L (ref 98–111)
Creatinine, Ser: 1.57 mg/dL — ABNORMAL HIGH (ref 0.61–1.24)
GFR, Estimated: 44 mL/min — ABNORMAL LOW (ref >60)
Glucose, Bld: 95 mg/dL (ref 70–99)
Potassium: 4.3 mmol/L (ref 3.5–5.1)
Sodium: 136 mmol/L (ref 135–145)

## 2021-04-25 ENCOUNTER — Other Ambulatory Visit (HOSPITAL_COMMUNITY): Payer: Self-pay | Admitting: Cardiology

## 2021-05-06 ENCOUNTER — Other Ambulatory Visit (HOSPITAL_COMMUNITY): Payer: Self-pay | Admitting: Cardiology

## 2021-05-27 ENCOUNTER — Other Ambulatory Visit (HOSPITAL_COMMUNITY): Payer: Self-pay | Admitting: *Deleted

## 2021-05-27 MED ORDER — FUROSEMIDE 20 MG PO TABS: 20.0000 mg | ORAL_TABLET | ORAL | 3 refills | Status: DC

## 2021-05-28 ENCOUNTER — Ambulatory Visit: Payer: Medicare Other | Admitting: Cardiology

## 2021-05-28 ENCOUNTER — Other Ambulatory Visit: Payer: Self-pay | Admitting: Cardiology

## 2021-06-03 ENCOUNTER — Telehealth: Payer: Self-pay | Admitting: Cardiology

## 2021-06-03 NOTE — Telephone Encounter (Signed)
Spoke to wife. Aware that I cannot see where pt needs any f/u blood work at this time. Aware that follow up BMET already obtained after starting Jardiance in April, to ensure kidneys maintaining. Wife appreciates call and will see [place] Friday.

## 2021-06-03 NOTE — Telephone Encounter (Signed)
Inez Catalina, Wife of patient called. The Wife wanted to know if the patient could have labs done tomorrow so Dr. Curt Bears could go over the results on Friday.  Please call

## 2021-06-04 ENCOUNTER — Other Ambulatory Visit (HOSPITAL_COMMUNITY): Payer: Self-pay | Admitting: Cardiology

## 2021-06-05 ENCOUNTER — Other Ambulatory Visit: Payer: Self-pay

## 2021-06-05 ENCOUNTER — Ambulatory Visit: Payer: Medicare Other | Admitting: Cardiology

## 2021-06-05 ENCOUNTER — Encounter: Payer: Self-pay | Admitting: Cardiology

## 2021-06-05 VITALS — BP 118/60 | HR 52 | Ht 66.0 in | Wt 112.8 lb

## 2021-06-05 DIAGNOSIS — I4819 Other persistent atrial fibrillation: Secondary | ICD-10-CM

## 2021-06-05 NOTE — Patient Instructions (Signed)
Medication Instructions:  °Your physician recommends that you continue on your current medications as directed. Please refer to the Current Medication list given to you today. ° °*If you need a refill on your cardiac medications before your next appointment, please call your pharmacy* ° ° °Lab Work: °None ordered ° ° °Testing/Procedures: °None ordered ° ° °Follow-Up: °At [facility], you and your health needs are our priority.  As part of our continuing mission to provide you with exceptional heart care, we have created designated Provider Care Teams.  These Care Teams include your primary Cardiologist (physician) and Advanced Practice Providers (APPs -  Physician Assistants and Nurse Practitioners) who all work together to provide you with the care you need, when you need it. ° °Your next appointment:   °6 month(s) ° °The format for your next appointment:   °In Person ° °Provider:   °Will Camnitz, MD ° ° ° °Thank you for choosing [facility]!! ° ° °Saachi Zale, RN °[phone] °  °

## 2021-06-05 NOTE — Progress Notes (Signed)
Electrophysiology Office Note   Date:  06/05/2021   ID:  [name], DOB July 15, 1936, MRN [id number]  PCP:  Burnard Bunting, MD  Cardiologist:  Aundra Dubin Primary Electrophysiologist:  Harriet Bollen Meredith Leeds, MD    No chief complaint on file.    History of Present Illness: [name] is a 85 y.o. male who is being seen today for the evaluation of atrial fibrillation/flutter at the request of Burnard Bunting, MD. Presenting today for electrophysiology evaluation.    He has a history significant for persistent atrial fibrillation, coronary artery disease, COPD, CVA, OSA, hypertension, hyperlipidemia.  He was diagnosed with atrial fibrillation several years ago and started on dofetilide.  He also has sleep apnea and is on BiPAP.  He has also had atypical atrial flutter.  He is status post atrial fibrillation/flutter ablation 06/08/2019.  During that time he had more frequent episodes of atrial flutter with multiple circuits.  He is status post repeat ablation 11/19/2020.  Today, denies symptoms of palpitations, chest pain, shortness of breath, orthopnea, PND, lower extremity edema, claudication, dizziness, presyncope, syncope, bleeding, or neurologic sequela. The patient is tolerating medications without difficulties.  Since being seen he has done well.  He has had minimal episodes of atrial fibrillation.  Unfortunately he does have some weakness and fatigue.  He is also been losing weight with diarrhea.  He has been following up with gastroenterology.  He is currently in junctional rhythm with a heart rate of 50.  He is on high-dose diltiazem.   Past Medical History:  Diagnosis Date   Abnormal nuclear cardiac imaging test March 2011   Has positive EKG response, no perfusion defect and normal EF   Adenomatous colon polyp 12/2002   Arrhythmia    Atrial fibrillation ([place])    a. s/p rfca;  b. chronic tikosyn and xarelto.   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 04/2017 Echo: EF 65-70%, restrictive physiology, mildly dil LA.   COPD (chronic obstructive pulmonary disease) (HCC)    Coronary artery disease    First MI in 1988. PCI in 1996 with subsequent CABG in 1996 due to restenosis. S/P stents to LCX in 2009. Noted to have residual disease in the LAD in a diffuse manner and atretic LIMA graft. He is managed medically.    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   History of Raynaud's syndrome    Hyperlipidemia    Hypertension    Internal hemorrhoids    Muscle spasm    Myocardial infarction (HCC)    MI x1 1989 - 38 age   Neuromuscular disorder ([place])    Polio - age 64   OSA (obstructive sleep apnea) 10/16/2018   Severe OSA with AHI 37.2/hr on BiPAP   Persistent atrial fibrillation ([place])    Polio    age 7   PVC (premature ventricular contraction)    Scoliosis    mild   Past Surgical History:  Procedure Laterality Date   ABLATION     done for a fib   APPENDECTOMY     Age 41  ATRIAL FIBRILLATION ABLATION N/A 06/08/2019   Procedure: ATRIAL FIBRILLATION ABLATION;  Surgeon: Constance Haw, MD;  Location: [place] [facility];  Service: Cardiovascular;  Laterality: N/A;   ATRIAL FIBRILLATION ABLATION N/A 11/19/2020   Procedure: ATRIAL FIBRILLATION ABLATION;  Surgeon: Constance Haw, MD;  Location: [place] [facility];  Service: Cardiovascular;  Laterality: N/A;   CARDIAC CATHETERIZATION     CARDIOVERSION N/A 08/22/2013   Procedure: CARDIOVERSION;  Surgeon: Carlena Bjornstad, MD;  Location: [place];  Service: Cardiovascular;  Laterality: N/A;   CARDIOVERSION N/A 01/16/2014   Procedure: CARDIOVERSION;  Surgeon:  Lelon Perla, MD;  Location: [facility] ENDOSCOPY;  Service: Cardiovascular;  Laterality: N/A;   CARDIOVERSION N/A 12/11/2018   Procedure: CARDIOVERSION;  Surgeon: Larey Dresser, MD;  Location: [name] ENDOSCOPY;  Service: Cardiovascular;  Laterality: N/A;   CARDIOVERSION N/A 11/20/2019   Procedure: CARDIOVERSION;  Surgeon: [name], MD;  Location: [facility] ENDOSCOPY;  Service: Endoscopy;  Laterality: N/A;   CARDIOVERSION N/A 07/10/2020   Procedure: CARDIOVERSION;  Surgeon: Larey Dresser, MD;  Location: [facility] ENDOSCOPY;  Service: Cardiovascular;  Laterality: N/A;   CARDIOVERSION N/A 01/21/2021   Procedure: CARDIOVERSION;  Surgeon: Larey Dresser, MD;  Location: [facility] ENDOSCOPY;  Service: Cardiovascular;  Laterality: N/A;   COLONOSCOPY  2008   CORONARY ARTERY BYPASS GRAFT  1996   with a LIMA to the LAD, SVG to RCA and SVG to OM   CORONARY STENT INTERVENTION N/A 03/26/2019   Procedure: CORONARY STENT INTERVENTION;  Surgeon: Jettie Booze, MD;  Location: [place] [facility];  Service: Cardiovascular;  Laterality: N/A;   CORONARY STENT PLACEMENT  1996   LCX   EMBOLECTOMY Right 05/09/2019   Procedure: Embolectomy of right radial artery;  Surgeon: Serafina Mitchell, MD;  Location: [place];  Service: Vascular;  Laterality: Right;   ESOPHAGEAL DILATION     EYE SURGERY     FALSE ANEURYSM REPAIR Right 05/09/2019   Procedure: REPAIR FALSE ANEURYSM RIGHT RADIAL;  Surgeon: Serafina Mitchell, MD;  Location: [place];  Service: Vascular;  Laterality: Right;   LEFT HEART CATH AND CORS/GRAFTS ANGIOGRAPHY N/A 03/26/2019   Procedure: LEFT HEART CATH AND CORS/GRAFTS ANGIOGRAPHY;  Surgeon: Larey Dresser, MD;  Location: [place] [facility];  Service: Cardiovascular;  Laterality: N/A;   POLYPECTOMY     TONSILLECTOMY     UPPER GASTROINTESTINAL ENDOSCOPY     esophegeal dilation     Current Outpatient Medications  Medication Sig Dispense Refill   acetaminophen (TYLENOL) 500 MG tablet Take 250-500 mg by mouth  every 8 (eight) hours as needed for mild pain or moderate pain.      albuterol (PROAIR HFA) 108 (90 BASE) MCG/ACT inhaler Inhale 2 puffs into the lungs every 4 (four) hours as needed for wheezing or shortness of breath (and prior to exercise). 1 Inhaler 3   dicyclomine (BENTYL) 10 MG capsule Take 1 capsule (10 mg total) by mouth 3 (three) times daily as needed for spasms (abdominal pain/spasms). 90 capsule 2   diltiazem (CARDIZEM CD) 360 MG 24 hr capsule Take 360 mg by mouth at bedtime.     dofetilide (TIKOSYN) 250 MCG capsule TAKE 1 CAPSULE BY MOUTH EVERY 12 HOURS 180 capsule 3   ELIQUIS 2.5 MG TABS tablet TAKE 1 TABLET BY MOUTH TWICE A DAY 120 tablet 0   empagliflozin (JARDIANCE) 10 MG TABS tablet Take 1 tablet (10 mg total) by mouth daily before breakfast. 30 tablet 6   ezetimibe (ZETIA)  10 MG tablet TAKE 1 TABLET BY MOUTH EVERY DAY 90 tablet 3   famotidine (PEPCID) 40 MG tablet TAKE 1 TABLET BY MOUTH EVERYDAY AT BEDTIME 90 tablet 0   furosemide (LASIX) 20 MG tablet Take 1 tablet (20 mg total) by mouth See admin instructions. Take 1 tablet (20 mg) by mouth scheduled in the morning (0800), may take an additional dose in the afternoon if needed for fluid retention/coughing. 30 tablet 3   KLOR-CON M20 20 MEQ tablet TAKE 1 TABLET BY MOUTH AT BEDTIME 90 tablet 3   lactobacillus acidophilus & bulgar (LACTINEX) chewable tablet Chew 2 tablets by mouth at bedtime.      loperamide (IMODIUM) 2 MG capsule Take 2 mg by mouth See admin instructions. Take 1 capsule (2 mg) by mouth in the morning (0800) & may take an additional dose in the evening if needed for diarrhea/loose stools.     LORazepam (ATIVAN) 0.5 MG tablet Take 0.5 mg by mouth at bedtime.     methocarbamol (ROBAXIN) 500 MG tablet Take 500 mg by mouth 3 (three) times daily as needed for muscle spasms.      nitroGLYCERIN (NITROSTAT) 0.4 MG SL tablet Place 1 tablet (0.4 mg total) under the tongue every 5 (five) minutes as needed for chest pain. 25  tablet 0   Probiotic Product (ALIGN) 4 MG CAPS Take 4 mg by mouth daily. (0800)     rosuvastatin (CRESTOR) 40 MG tablet TAKE 1 TABLET BY MOUTH EVERY DAY 90 tablet 3   Testosterone POWD Apply 1 mL topically daily. Testosterone (AT) 150/0(150mg ) cpd     traZODone (DESYREL) 50 MG tablet Take 50 mg by mouth at bedtime.     Vitamin D, Ergocalciferol, (DRISDOL) 50000 UNITS CAPS capsule Take 50,000 Units by mouth every 14 (fourteen) days. Take on 1st and 15th at 8p.     No current facility-administered medications for this visit.    Allergies:   Morphine and related and Tape   Social History:  The patient  reports that he quit smoking about 54 years ago. His smoking use included cigarettes. He has a 2.50 pack-year smoking history. He quit smokeless tobacco use about 72 years ago. He reports that he does not drink alcohol and does not use drugs.   Family History:  The patient's family history includes Cancer in his son; Heart Problems in his brother; Heart disease in his father, maternal grandfather, maternal grandmother, mother, paternal grandfather, and paternal grandmother; Prostate cancer in his paternal uncle.   ROS:  Please see the history of present illness.   Otherwise, review of systems is positive for none.   All other systems are reviewed and negative.   PHYSICAL EXAM: VS:  BP 118/60   Pulse (!) 52   Ht 5\' 6"  (1.676 m)   Wt 112 lb 12.8 oz (51.2 kg)   SpO2 96%   BMI 18.21 kg/m  , BMI Body mass index is 18.21 kg/m. GEN: Well nourished, well developed, in no acute distress  HEENT: normal  Neck: no JVD, carotid bruits, or masses Cardiac: RRR; no murmurs, rubs, or gallops,no edema  Respiratory:  clear to auscultation bilaterally, normal work of breathing GI: soft, nontender, nondistended, + BS MS: no deformity or atrophy  Skin: warm and dry Neuro:  Strength and sensation are intact Psych: euthymic mood, full affect  EKG:  EKG is ordered today. Personal review of the ekg ordered  shows junctional rhythm, rate 52, PVC  Recent Labs: 03/17/2021: ALT 29; B  Natriuretic Peptide 197.1; Hemoglobin 13.3; Platelets 240 04/09/2021: Magnesium 2.3 04/20/2021: BUN 20; Creatinine, Ser 1.57; Potassium 4.3; Sodium 136    Lipid Panel     Component Value Date/Time   CHOL 131 08/08/2020 1338   TRIG 50 08/08/2020 1338   HDL 52 08/08/2020 1338   CHOLHDL 2.5 08/08/2020 1338   VLDL 10 08/08/2020 1338   LDLCALC 69 08/08/2020 1338     Wt Readings from Last 3 Encounters:  06/05/21 112 lb 12.8 oz (51.2 kg)  04/09/21 123 lb 12.8 oz (56.2 kg)  03/17/21 120 lb (54.4 kg)      Other studies Reviewed: Additional studies/ records that were reviewed today include: TTE 12/29/18  Review of the above records today demonstrates:  - Left ventricle: The cavity size was normal. Systolic function was   normal. The estimated ejection fraction was in the range of 55%   to 60%. Hypokinesis of the basalanteroseptal and inferoseptal   myocardium. The study is not technically sufficient to allow   evaluation of LV diastolic function. - Aortic valve: Valve mobility was restricted. Transvalvular   velocity was within the normal range. There was no stenosis.   There was no regurgitation. Mean gradient (S): 8 mm Hg. Valve   area (VTI): 1.28 cm^2. Valve area (Vmax): 1.28 cm^2. Valve area   (Vmean): 1.26 cm^2. - Mitral valve: Transvalvular velocity was within the normal range.   There was no evidence for stenosis. There was mild regurgitation. - Left atrium: The atrium was moderately dilated. - Right ventricle: The cavity size was normal. Wall thickness was   normal. Systolic function was normal. - Tricuspid valve: There was mild regurgitation. - Pulmonary arteries: Systolic pressure was within the normal   range. PA peak pressure: 35 mm Hg (S).   ASSESSMENT AND PLAN:  1.  Persistent atrial fibrillation/atypical atrial flutter: Currently on Xarelto and dofetilide.  High risk medication monitoring.   Status post ablation 05/29/2019.  He had multiple atrial flutter circuits at the time.  He is about more frequent episodes of arrhythmias and is status post cardioversion.  He is now on diltiazem.  He is in junctional rhythm today.  He has some weakness and fatigue.  He Darrol Brandenburg consider whether or not he wants to make adjustments to his medications as he has not had much atrial fibrillation on the current doses.  2.  Obstructive sleep apnea: CPAP compliance encouraged   3.  Coronary artery disease: Status post CABG.  No current chest pain.    4.  Hypertension: Currently well controlled   Current medicines are reviewed at length with the patient today.   The patient does not have concerns regarding his medicines.  The following changes were made today: None  Labs/ tests ordered today include:  Orders Placed This Encounter  Procedures   EKG 12-Lead      Disposition:   FU with Juna Caban 6 months  Signed, Stephfon Bovey Meredith Leeds, MD  06/05/2021 10:22 AM     [facility] [address] [place] [address] [phone] (office) [phone] (fax)

## 2021-06-21 ENCOUNTER — Other Ambulatory Visit: Payer: Self-pay | Admitting: Gastroenterology

## 2021-06-24 ENCOUNTER — Other Ambulatory Visit: Payer: Self-pay

## 2021-06-24 ENCOUNTER — Telehealth: Payer: Self-pay | Admitting: Nurse Practitioner

## 2021-06-24 ENCOUNTER — Encounter: Payer: Self-pay | Admitting: Nurse Practitioner

## 2021-06-24 ENCOUNTER — Ambulatory Visit (INDEPENDENT_AMBULATORY_CARE_PROVIDER_SITE_OTHER): Payer: Medicare Other | Admitting: Nurse Practitioner

## 2021-06-24 VITALS — BP 148/58 | HR 72 | Ht 66.0 in | Wt 116.0 lb

## 2021-06-24 DIAGNOSIS — R1030 Lower abdominal pain, unspecified: Secondary | ICD-10-CM

## 2021-06-24 DIAGNOSIS — R1310 Dysphagia, unspecified: Secondary | ICD-10-CM | POA: Diagnosis not present

## 2021-06-24 DIAGNOSIS — K219 Gastro-esophageal reflux disease without esophagitis: Secondary | ICD-10-CM | POA: Diagnosis not present

## 2021-06-24 MED ORDER — FAMOTIDINE 40 MG PO TABS: ORAL_TABLET | ORAL | 3 refills | Status: DC

## 2021-06-24 NOTE — Patient Instructions (Addendum)
If you are age 83 or older, your body mass index should be between 23-30. Your Body mass index is 18.72 kg/m. If this is out of the aforementioned range listed, please consider follow up with your Primary Care Provider.  The [facility] providers would like to encourage you to use [facility] to communicate with providers for non-urgent requests or questions.  Due to long hold times on the telephone, sending your provider a message by [facility] may be faster and more efficient way to get a response. Please allow 48 business hours for a response.  Please remember that this is for non-urgent requests/questions.   RECOMMENDATIONS: Continue taking the Famotidine once a day, we sent in a refill. Try [place] one twice a day as needed for abdominal pain. Consider a chest/abdominal/pelvic CT scan is weight loss persists.  Please call our office if your symptoms worsen.  It was great seeing you today! Thank you for entrusting me with your care and choosing [facility].  Noralyn Pick, CRNP

## 2021-06-24 NOTE — Progress Notes (Signed)
06/24/2021 [name] [id number] [birth year]   Chief Complaint: Famotidine refill, GERD follow up  History of Present Illness: [name] is an 85 year old male with a past medical history of hypertension, atrial fibrillation on Eliquis, CAD s/p MI 1988 s/p DES x 1 2009 s/p CABG 2706, diastolic CHF, brainstem CVA, COPD, difficult intubation, obstructive sleep apnea, polio as a child, GERD, esophageal stricture, small pancreatic body cyst, chronic diarrhea and colon polyps.   He presents to our office today accompanied by his wife to refill his Famotidine prescription. He denies having any heartburn or upper abdominal pain. He has chronic oral phase dysphagia x 4 to 5 years. He describes having food such as rice or toast or tablets/capsules which get "hung up" to the left throat area which has not worsened since he saw Dr. Fuller Plan 06/24/2020. At that time, Dr. Fuller Plan recommended an EGD but the patient preferred to hold off and wanted to monitor his symptoms. He has chronic RLQ and LLQ pain which hasn't worsened over the past year, comes and goes and is relieved by taking Dicyclomine 53m one tab once or twice daily. His appetite has been decreased for a few month. He reduced his salt intake, food doesn't taste as well. His wife is concerned about his weight loss. Weight 124 April 2022. Today weight is 116lbs. He attributes some weight loss to taking Lasix with resolution of LE edema and recently starting Jardiance. Diarrhea is less. No bloody stools or melena. His most recent EGD was 10/2017 which showed a benign esophageal stenosis which was dilated colonoscopy was done on the same date and a 7 mm tubular adenomatous polyp was removed from the sigmoid colon.  He has a history of a small pancreatic cyst. 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  A repeat abdominal MRI/MRCP in 2 years was recommended.  Labs 06/15/2021: Glucose 120.  Sodium 138.  Potassium 4.7.  Creatinine 1.5.  BUN 19.  Calcium 10.5.  Albumin 4.3.  AST 31.  ALT 32.  Alk phos 121.  WBC 7.59.  Hemoglobin 14.8.  Hematocrit 45.9.  Platelet 194.  TSH 2.07.  Free T4 1.2.  Colonoscopy 10/2017 - The examined portion of the ileum was normal. - One 7 mm polyp in the sigmoid colon, removed with a cold snare. Resected and retrieved. - Internal hemorrhoids. - The examination was otherwise normal on direct and retroflexion views. Biopsies obtained.   EGD 10/2017 - Benign-appearing esophageal stenosis. Dilated. - Normal stomach. - Normal duodenal bulb and second portion of the duodenum. Biopsied. - No specimens collected. Biopsy Results: 1. Surgical [P], random sites - BENIGN COLONIC MUCOSA - NO ACTIVE INFLAMMATION OR EVIDENCE OF MICROSCOPIC COLITIS - NO HIGH GRADE DYSPLASIA OR MALIGNANCY IDENTIFIED 2. Surgical [P], sigmoid, polyp - TUBULAR ADENOMA (1 OF 1 FRAGMENTS) - NO HIGH GRADE DYSPLASIA OR MALIGNANCY IDENTIFIED 3. Surgical [P], descending duodenum - BENIGN DUODENAL MUCOSA - NO ACUTE INFLAMMATION, VILLOUS BLUNTING OR INCREASED INTRAEPITHELIAL LYMPHOCYTES  Abdominal MRI and MRCP w/wo contrast 02/16/2021: 1. Similar size and appearance of the small cystic lesion in the pancreatic body without high risk MRI features, likely a  side branch IPMN. Follow-up MRI abdomen without and with IV contrast recommended in 2 years.  No acute findings in the abdomen.     Past Medical History:  Diagnosis  Date   Abnormal nuclear cardiac imaging test March 2011   Has positive EKG response, no perfusion defect and normal EF   Adenomatous colon polyp 12/2002   Arrhythmia    Atrial fibrillation ([place])    a. s/p rfca;  b. chronic tikosyn and xarelto.   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 04/2017 Echo: EF 65-70%, restrictive physiology, mildly dil LA.   COPD (chronic obstructive pulmonary disease) (HCC)    Coronary artery disease    First MI in 1988. PCI in 1996 with subsequent CABG in 1996 due to restenosis. S/P stents to LCX in 2009. Noted to have residual disease in the LAD in a diffuse manner and atretic LIMA graft. He is managed medically.    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   History of Raynaud's syndrome    Hyperlipidemia    Hypertension    Internal hemorrhoids    Muscle spasm    Myocardial infarction (HCC)    MI x1 1989 - 47 age   Neuromuscular disorder ([place])    Polio - age 77   OSA (obstructive sleep apnea) 10/16/2018   Severe OSA with AHI 37.2/hr on BiPAP   Persistent atrial fibrillation ([place])    Polio    age 73   PVC (premature ventricular contraction)    Scoliosis    mild   Past Surgical History:  Procedure Laterality Date   ABLATION     done for a fib   APPENDECTOMY     Age 59   ATRIAL FIBRILLATION ABLATION N/A 06/08/2019   Procedure: ATRIAL FIBRILLATION ABLATION;  Surgeon: Constance Haw, MD;  Location: [place] [facility];  Service: Cardiovascular;  Laterality: N/A;   ATRIAL FIBRILLATION ABLATION N/A 11/19/2020   Procedure: ATRIAL FIBRILLATION ABLATION;  Surgeon: Constance Haw, MD;  Location: [place] [facility];  Service: Cardiovascular;  Laterality: N/A;   CARDIAC CATHETERIZATION     CARDIOVERSION N/A 08/22/2013   Procedure: CARDIOVERSION;  Surgeon: Carlena Bjornstad, MD;  Location: [place];  Service: Cardiovascular;  Laterality: N/A;   CARDIOVERSION N/A 01/16/2014   Procedure: CARDIOVERSION;  Surgeon: Lelon Perla, MD;  Location: [facility] ENDOSCOPY;   Service: Cardiovascular;  Laterality: N/A;   CARDIOVERSION N/A 12/11/2018   Procedure: CARDIOVERSION;  Surgeon: Larey Dresser, MD;  Location: [facility] ENDOSCOPY;  Service: Cardiovascular;  Laterality: N/A;   CARDIOVERSION N/A 11/20/2019   Procedure: CARDIOVERSION;  Surgeon: [name], MD;  Location: [facility] ENDOSCOPY;  Service: Endoscopy;  Laterality: N/A;   CARDIOVERSION N/A 07/10/2020   Procedure: CARDIOVERSION;  Surgeon: Larey Dresser, MD;  Location: [facility] ENDOSCOPY;  Service: Cardiovascular;  Laterality: N/A;   CARDIOVERSION N/A 01/21/2021   Procedure: CARDIOVERSION;  Surgeon: Larey Dresser, MD;  Location: [facility] ENDOSCOPY;  Service: Cardiovascular;  Laterality: N/A;   COLONOSCOPY  2008   CORONARY ARTERY BYPASS GRAFT  1996   with a LIMA to the LAD, SVG to RCA and SVG to OM   CORONARY STENT INTERVENTION N/A 03/26/2019   Procedure: CORONARY STENT INTERVENTION;  Surgeon: Jettie Booze, MD;  Location: [facility]  INVASIVE CV LAB;  Service: Cardiovascular;  Laterality: N/A;   CORONARY STENT PLACEMENT  1996   LCX   EMBOLECTOMY Right 05/09/2019   Procedure: Embolectomy of right radial artery;  Surgeon: Serafina Mitchell, MD;  Location: [place];  Service: Vascular;  Laterality: Right;   ESOPHAGEAL DILATION     EYE SURGERY     FALSE ANEURYSM REPAIR Right 05/09/2019   Procedure: REPAIR FALSE ANEURYSM RIGHT RADIAL;  Surgeon: Serafina Mitchell, MD;  Location: [place];  Service: Vascular;  Laterality: Right;   LEFT HEART CATH AND CORS/GRAFTS ANGIOGRAPHY N/A 03/26/2019   Procedure: LEFT HEART CATH AND CORS/GRAFTS ANGIOGRAPHY;  Surgeon: Larey Dresser, MD;  Location: [place] [facility];  Service: Cardiovascular;  Laterality: N/A;   POLYPECTOMY     TONSILLECTOMY     UPPER GASTROINTESTINAL ENDOSCOPY     esophegeal dilation   Current Outpatient Medications on File Prior to Visit  Medication Sig Dispense Refill   acetaminophen (TYLENOL) 500 MG tablet Take 250-500 mg by mouth every 8 (eight) hours as needed  for mild pain or moderate pain.      albuterol (PROAIR HFA) 108 (90 BASE) MCG/ACT inhaler Inhale 2 puffs into the lungs every 4 (four) hours as needed for wheezing or shortness of breath (and prior to exercise). 1 Inhaler 3   dicyclomine (BENTYL) 10 MG capsule Take 1 capsule (10 mg total) by mouth 3 (three) times daily as needed for spasms (abdominal pain/spasms). 90 capsule 2   diltiazem (CARDIZEM CD) 360 MG 24 hr capsule Take 360 mg by mouth at bedtime.     dofetilide (TIKOSYN) 250 MCG capsule TAKE 1 CAPSULE BY MOUTH EVERY 12 HOURS 180 capsule 3   ELIQUIS 2.5 MG TABS tablet TAKE 1 TABLET BY MOUTH TWICE A DAY 120 tablet 0   empagliflozin (JARDIANCE) 10 MG TABS tablet Take 1 tablet (10 mg total) by mouth daily before breakfast. 30 tablet 6   ezetimibe (ZETIA) 10 MG tablet TAKE 1 TABLET BY MOUTH EVERY DAY 90 tablet 3   furosemide (LASIX) 20 MG tablet Take 1 tablet (20 mg total) by mouth See admin instructions. Take 1 tablet (20 mg) by mouth scheduled in the morning (0800), may take an additional dose in the afternoon if needed for fluid retention/coughing. 30 tablet 3   KLOR-CON M20 20 MEQ tablet TAKE 1 TABLET BY MOUTH AT BEDTIME 90 tablet 3   lactobacillus acidophilus & bulgar (LACTINEX) chewable tablet Chew 2 tablets by mouth at bedtime.      loperamide (IMODIUM) 2 MG capsule Take 2 mg by mouth See admin instructions. Take 1 capsule (2 mg) by mouth in the morning (0800) & may take an additional dose in the evening if needed for diarrhea/loose stools.     LORazepam (ATIVAN) 0.5 MG tablet Take 0.5 mg by mouth at bedtime.     methocarbamol (ROBAXIN) 500 MG tablet Take 500 mg by mouth 3 (three) times daily as needed for muscle spasms.      nitroGLYCERIN (NITROSTAT) 0.4 MG SL tablet Place 1 tablet (0.4 mg total) under the tongue every 5 (five) minutes as needed for chest pain. 25 tablet 0   Probiotic Product (ALIGN) 4 MG CAPS Take 4 mg by mouth daily. (0800)     rosuvastatin (CRESTOR) 40 MG tablet TAKE 1  TABLET BY MOUTH EVERY DAY 90 tablet 3   Testosterone POWD Apply 1 mL topically daily. Testosterone (AT) 150/0(140m) cpd     traZODone (DESYREL) 50 MG tablet Take  50 mg by mouth at bedtime.     Vitamin D, Ergocalciferol, (DRISDOL) 50000 UNITS CAPS capsule Take 50,000 Units by mouth every 14 (fourteen) days. Take on 1st and 15th at 8p.     No current facility-administered medications on file prior to visit.   Current Medications, Allergies, Past Medical History, Past Surgical History, Family History and Social History were reviewed in [employer] record.   Review of Systems:   Constitutional: Negative for fever, sweats, chills or weight loss.  Respiratory: Negative for shortness of breath.   Cardiovascular: Negative for chest pain, palpitations and leg swelling.  Gastrointestinal: See HPI.  Musculoskeletal: Negative for back pain or muscle aches.  Neurological: Negative for dizziness, headaches or paresthesias.    Physical Exam: BP (!) 148/58   Pulse 72   Ht _0  (1.676 m)   Wt 116 lb (52.6 kg)   BMI 18.72 kg/m   Wt Readings from Last 3 Encounters:  06/24/21 116 lb (52.6 kg)  06/05/21 112 lb 12.8 oz (51.2 kg)  04/09/21 123 lb 12.8 oz (56.2 kg)    General: Thin 85 year old male in no acute distress. Head: Normocephalic and atraumatic. Eyes: No scleral icterus. Conjunctiva pink . Ears: Normal auditory acuity. Mouth: Dentition intact. No ulcers or lesions.  Lungs: Clear throughout to auscultation. Heart: Regular rate and rhythm, no murmur. Abdomen: Soft, nontender and nondistended. No masses or hepatomegaly. Normal bowel sounds x 4 quadrants.  Rectal: Deferred.  Musculoskeletal: Symmetrical with no gross deformities. Extremities: No edema. Neurological: Alert oriented x 4. No focal deficits.  Psychological: Alert and cooperative. Normal mood and affect  Assessment and Recommendations:  1. GERD with history of esophageal stricture with chronic oral  dysphagia.  -Patient declines EGD -I discussed scheduling a barium swallow to assess for esophageal diverticulum and esophageal dysmotility, he declines for now -continue Famotidine 75m QD -Patient to call our office if symptoms worsen    2. 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 Stable surveillance abdominal MRI/MRCP 01/2021.  -Repeat abdominal MRI 2/ 2024  3. Weight loss -Recommend chest/abdominal/pelvic CT if weight loss persist, patient will follow up with PCP for weight recheck    4.  Personal history of adenomatous colon polyps.  Last colonoscopy in November 2018 only one small polyp found, no future surveillance colonoscopies recommended due to age.   5. Chronic lower abdominal pain LLQ > RLQ fairly well controlled by taking Dicyclomine 153mone tab once or twice daily -Continue Dicyclomine 1070mo tid PRN as tolerated  -Consider CTAP in setting of chronic lower abdominal pain with recent weight loss, to discuss further with Dr. StaFuller Plan6. Atrial fibrillation on Eliquis   7. CAD, MI 02/2019 s/p DES. Ischemic CM/diastolic CHF.

## 2021-06-24 NOTE — Progress Notes (Signed)
Reviewed and agree with management plan. Recommend chest/abdomen/pelvic CT to further evaluate weight loss and lower abdominal pain.   Pricilla Riffle. Fuller Plan, MD FACG [phone]

## 2021-06-24 NOTE — Telephone Encounter (Signed)
Pt called stating that he was returning Colleen's call but not sure because I could not find any documentation about that. Pls call him back.

## 2021-06-25 ENCOUNTER — Other Ambulatory Visit: Payer: Self-pay

## 2021-06-25 ENCOUNTER — Telehealth: Payer: Self-pay

## 2021-06-25 DIAGNOSIS — R634 Abnormal weight loss: Secondary | ICD-10-CM

## 2021-06-25 DIAGNOSIS — K219 Gastro-esophageal reflux disease without esophagitis: Secondary | ICD-10-CM

## 2021-06-25 DIAGNOSIS — R1030 Lower abdominal pain, unspecified: Secondary | ICD-10-CM

## 2021-06-25 NOTE — Telephone Encounter (Signed)
Scheduled CT-chest/abd/pelvis on 07/01/21 at [facility]. Patient to arrive at 2:00 pm and be NPO of solids after 10:30 am. Contrast at the front desk for patient pickup and patient's wife given instructions on drinking. 2 hours before and 1 hour before. Patient to come into our lab on Friday 06/26/21 or Tues. am 06/30/21 for BUN/Creatine.

## 2021-06-25 NOTE — Telephone Encounter (Signed)
Patient states she is returning missed call... Plz advise   thank you

## 2021-06-25 NOTE — Telephone Encounter (Signed)
Called and spoke to patient's wife, she stated her husband was at work, but she knew he would be OK scheduling a CT for him. Left message with [place] CT to please call back to schedule. Order in [place] for labs before.

## 2021-06-26 ENCOUNTER — Other Ambulatory Visit: Payer: Medicare Other

## 2021-06-26 DIAGNOSIS — K219 Gastro-esophageal reflux disease without esophagitis: Secondary | ICD-10-CM

## 2021-06-26 DIAGNOSIS — R1030 Lower abdominal pain, unspecified: Secondary | ICD-10-CM

## 2021-06-26 DIAGNOSIS — R634 Abnormal weight loss: Secondary | ICD-10-CM

## 2021-06-27 LAB — BUN/CREATININE RATIO
BUN/Creatinine Ratio: 17 (calc) (ref 6–22)
BUN: 30 mg/dL — ABNORMAL HIGH (ref 7–25)
Creat: 1.72 mg/dL — ABNORMAL HIGH (ref 0.70–1.11)
GFR, Est African American: 42 mL/min/{1.73_m2} — ABNORMAL LOW (ref >=60)
GFR, Est Non African American: 36 mL/min/{1.73_m2} — ABNORMAL LOW (ref >=60)

## 2021-06-29 ENCOUNTER — Telehealth: Payer: Self-pay | Admitting: Nurse Practitioner

## 2021-06-29 NOTE — Telephone Encounter (Signed)
Beth, pls contact the patient and radiology. His creatinine level too high. He is scheduled for a chest/abd/pelvic CT on 7/6 with  imaging.  Please cancel the prior CT order and enter new order as follows:  Chest/abdominal/pelvic CT with oral contrast only. NO IV CONTRAST.   Patient have patient follow up with his primary care physician regarding his elevated creatinine level. THX

## 2021-06-30 NOTE — Telephone Encounter (Signed)
[place] CT is aware of the elevated BUN and Creatinine. Per CT tech the IV contrast will be adjusted per radiology protocol. Per patient request, the labs have been routed to PCP and cardiology.

## 2021-06-30 NOTE — Telephone Encounter (Signed)
[name], [place] CT is aware of the elevated BUN and creatinine. He will receive an adjusted dose of the IV contrast. I have routed a copy of the labs to his PCP and cardiologist.

## 2021-06-30 NOTE — Telephone Encounter (Signed)
OK for lower dose IV contrast as recommended by the radiologist.

## 2021-07-01 ENCOUNTER — Telehealth: Payer: Self-pay

## 2021-07-01 ENCOUNTER — Other Ambulatory Visit: Payer: Self-pay

## 2021-07-01 ENCOUNTER — Ambulatory Visit (INDEPENDENT_AMBULATORY_CARE_PROVIDER_SITE_OTHER)
Admission: RE | Admit: 2021-07-01 | Discharge: 2021-07-01 | Disposition: A | Discharge status: Home / Self Care | Payer: Medicare Other | Source: Ambulatory Visit | Attending: Nurse Practitioner | Admitting: Nurse Practitioner

## 2021-07-01 ENCOUNTER — Telehealth (HOSPITAL_COMMUNITY): Payer: Self-pay | Admitting: Cardiology

## 2021-07-01 DIAGNOSIS — R1030 Lower abdominal pain, unspecified: Secondary | ICD-10-CM

## 2021-07-01 DIAGNOSIS — J90 Pleural effusion, not elsewhere classified: Secondary | ICD-10-CM

## 2021-07-01 DIAGNOSIS — R634 Abnormal weight loss: Secondary | ICD-10-CM | POA: Diagnosis not present

## 2021-07-01 DIAGNOSIS — I5032 Chronic diastolic (congestive) heart failure: Secondary | ICD-10-CM

## 2021-07-01 MED ORDER — IOHEXOL 300 MG/ML  SOLN
80.0000 mL | Freq: Once | INTRAMUSCULAR | Status: AC | PRN
  Administered 2021-07-01: 80 mL via INTRAVENOUS

## 2021-07-01 MED ORDER — FUROSEMIDE 20 MG PO TABS: 20.0000 mg | ORAL_TABLET | ORAL | 3 refills | Status: DC

## 2021-07-01 NOTE — Telephone Encounter (Signed)
-----   Message from Larey Dresser, MD sent at 06/30/2021 10:36 AM EDT ----- Decrease Lasix to 20 mg every other day.  BMET 10 days.   ----- Message ----- From: Greggory Keen, LPN Sent: 7/0/2637   9:03 AM EDT To: Larey Dresser, MD  Elevated BUN and creatinine- on diurectics-patient wants provider to be aware

## 2021-07-01 NOTE — Telephone Encounter (Signed)
Pt aware via wife Repeat ;abs 7/15

## 2021-07-01 NOTE — Telephone Encounter (Signed)
-----   Message from Ladene Artist, MD sent at 06/30/2021  4:27 PM EDT ----- [name],  Please contact the patient or his wife [phone] regarding a message left for me to review his recent labs with attention to his BUN/Cr. His BUN/Cr from 7/1 was already noted which was slightly higher than it has been over the past few months.  Please advise him to substantially increase his water, juice intake today, tonight and tomorrow before and after his CT scan. His abnormal renal function requires further follow up with his PCP. His chest/abd/pelvic CT scan IV contrast will be adjusted per Radiology protocol.   MS

## 2021-07-01 NOTE — Telephone Encounter (Signed)
Informed patient's wife of Dr. Lynne Leader recommendations. Patient's wife verbalized understanding and states her husband has already increased his water intake.

## 2021-07-04 ENCOUNTER — Other Ambulatory Visit (HOSPITAL_COMMUNITY): Payer: Self-pay | Admitting: Cardiology

## 2021-07-09 ENCOUNTER — Other Ambulatory Visit: Payer: Self-pay

## 2021-07-09 ENCOUNTER — Ambulatory Visit (HOSPITAL_COMMUNITY)
Admission: RE | Admit: 2021-07-09 | Discharge: 2021-07-09 | Disposition: A | Discharge status: Home / Self Care | Payer: Medicare Other | Source: Ambulatory Visit | Attending: Cardiology | Admitting: Cardiology

## 2021-07-09 DIAGNOSIS — I5032 Chronic diastolic (congestive) heart failure: Secondary | ICD-10-CM

## 2021-07-10 ENCOUNTER — Other Ambulatory Visit: Payer: Self-pay | Admitting: Gastroenterology

## 2021-07-27 ENCOUNTER — Encounter (HOSPITAL_COMMUNITY): Payer: Self-pay | Admitting: Cardiology

## 2021-07-27 ENCOUNTER — Other Ambulatory Visit: Payer: Self-pay

## 2021-07-27 ENCOUNTER — Ambulatory Visit (HOSPITAL_COMMUNITY)
Admission: RE | Admit: 2021-07-27 | Discharge: 2021-07-27 | Disposition: A | Discharge status: Home / Self Care | Payer: Medicare Other | Source: Ambulatory Visit | Attending: Cardiology | Admitting: Cardiology

## 2021-07-27 VITALS — BP 120/60 | HR 62 | Wt 116.2 lb

## 2021-07-27 DIAGNOSIS — I252 Old myocardial infarction: Secondary | ICD-10-CM | POA: Insufficient documentation

## 2021-07-27 DIAGNOSIS — Z79899 Other long term (current) drug therapy: Secondary | ICD-10-CM | POA: Diagnosis not present

## 2021-07-27 DIAGNOSIS — I484 Atypical atrial flutter: Secondary | ICD-10-CM | POA: Insufficient documentation

## 2021-07-27 DIAGNOSIS — E785 Hyperlipidemia, unspecified: Secondary | ICD-10-CM | POA: Insufficient documentation

## 2021-07-27 DIAGNOSIS — I5032 Chronic diastolic (congestive) heart failure: Secondary | ICD-10-CM | POA: Diagnosis not present

## 2021-07-27 DIAGNOSIS — I2510 Atherosclerotic heart disease of native coronary artery without angina pectoris: Secondary | ICD-10-CM | POA: Diagnosis not present

## 2021-07-27 DIAGNOSIS — I4891 Unspecified atrial fibrillation: Secondary | ICD-10-CM | POA: Diagnosis not present

## 2021-07-27 DIAGNOSIS — Z885 Allergy status to narcotic agent status: Secondary | ICD-10-CM | POA: Diagnosis not present

## 2021-07-27 DIAGNOSIS — G4733 Obstructive sleep apnea (adult) (pediatric): Secondary | ICD-10-CM | POA: Insufficient documentation

## 2021-07-27 DIAGNOSIS — Z7901 Long term (current) use of anticoagulants: Secondary | ICD-10-CM | POA: Diagnosis not present

## 2021-07-27 DIAGNOSIS — Z8673 Personal history of transient ischemic attack (TIA), and cerebral infarction without residual deficits: Secondary | ICD-10-CM | POA: Diagnosis not present

## 2021-07-27 DIAGNOSIS — Z8249 Family history of ischemic heart disease and other diseases of the circulatory system: Secondary | ICD-10-CM | POA: Insufficient documentation

## 2021-07-27 DIAGNOSIS — I255 Ischemic cardiomyopathy: Secondary | ICD-10-CM | POA: Diagnosis not present

## 2021-07-27 DIAGNOSIS — I480 Paroxysmal atrial fibrillation: Secondary | ICD-10-CM

## 2021-07-27 DIAGNOSIS — Z955 Presence of coronary angioplasty implant and graft: Secondary | ICD-10-CM | POA: Insufficient documentation

## 2021-07-27 DIAGNOSIS — E7849 Other hyperlipidemia: Secondary | ICD-10-CM | POA: Diagnosis not present

## 2021-07-27 DIAGNOSIS — Z951 Presence of aortocoronary bypass graft: Secondary | ICD-10-CM | POA: Diagnosis not present

## 2021-07-27 LAB — BASIC METABOLIC PANEL
Anion gap: 7 (ref 5–15)
BUN: 15 mg/dL (ref 8–23)
CO2: 26 mmol/L (ref 22–32)
Calcium: 10 mg/dL (ref 8.9–10.3)
Chloride: 103 mmol/L (ref 98–111)
Creatinine, Ser: 1.47 mg/dL — ABNORMAL HIGH (ref 0.61–1.24)
GFR, Estimated: 47 mL/min — ABNORMAL LOW (ref >60)
Glucose, Bld: 88 mg/dL (ref 70–99)
Potassium: 4.5 mmol/L (ref 3.5–5.1)
Sodium: 136 mmol/L (ref 135–145)

## 2021-07-27 LAB — LIPID PANEL
Cholesterol: 131 mg/dL (ref 0–200)
HDL: 50 mg/dL (ref >40)
LDL Cholesterol: 74 mg/dL (ref 0–99)
Total CHOL/HDL Ratio: 2.6 RATIO
Triglycerides: 35 mg/dL (ref <150)
VLDL: 7 mg/dL (ref 0–40)

## 2021-07-27 LAB — BRAIN NATRIURETIC PEPTIDE: B Natriuretic Peptide: 306.6 pg/mL — ABNORMAL HIGH (ref 0.0–100.0)

## 2021-07-27 LAB — MAGNESIUM: Magnesium: 2.6 mg/dL — ABNORMAL HIGH (ref 1.7–2.4)

## 2021-07-27 MED ORDER — DILTIAZEM HCL ER COATED BEADS 240 MG PO CP24: 240.0000 mg | ORAL_CAPSULE | Freq: Every evening | ORAL | 3 refills | Status: DC

## 2021-07-27 NOTE — Progress Notes (Signed)
ID:  [name], DOB [birth year], MRN [id number]  Provider location: [facility] Type of Visit: Established patient  PCP:  Burnard Bunting, MD  Cardiologist:  [name]   History of Present Illness: [name] is a 85 y.o. male who has history of CAD s/p CABG.  Cath in 11/09 showed that the SVG-distal RCA was patent, the CFX system was patent.  His LIMA was atretic and there were serial 60% and 80% stenoses in the native LAD.  He was managed medically.  Echo in 8/14 showed EF 55-60% with moderate MR and moderate TR.    He was initially noted to have atrial fibrillation in the summer of 2014. He was started on Xarelto and cardioverted to NSR in 8/14.  Recurrent atrial fibrillation was noted in 1/15, and he was cardioverted to NSR again.  This time, NSR did not hold long. By 5/15, he was in persistent atrial fibrillation.  I referred him to [facility] where he had atrial fibrillation ablation and Tikosyn loading.  Ranolazine was stopped due to risk of QT prolongation and Imdur was started as an anti-anginal instead.  Lexiscan Cardiolite in 11/15 showed no ischemia.     CTA chest done in 2/16 to look for evidence for PV stenosis post-AF ablation.  This showed mild short-segment narrowing of the left inferior pulmonary vein.    Patient was admitted in 5/18 with acute on chronic diastolic CHF.  He had been at the beach for [place] days and ate out a number of times, probably getting a significant sodium load.  No chest pain.  He was in normal sinus rhythm.  He was started on IV Lasix and diuresed.  Echo in 5/18 showed EF 65-70% with moderately dilated RV and normal systolic function, PASP 57 mmHg.  Lexiscan Cardiolite in 6/18 showed no significant perfusion defect.    He was admitted with NSTEMI in 3/20, had DES to ostial LAD and mid SVG-RCA.  Echo showed EF 45-50%.  He was in atypical atrial flutter persistently despite Tikosyn.  After his intervention, he was noted to  have a radial artery pseudoaneurysm that was repaired by Dr. Trula Slade.   In 6/20, he had a redo atrial fibrillation ablation.  He was also noted to have complex flutter from multiple foci that was not ablated.  After the procedure, he has had considerable pain in the right leg at the cath site but also radiating down the leg, groin US showed no AV fistula or pseudoaneurysm.    In 10/20, patient held anticoagulation for 2 days for oral surgery and had a CVA presenting as right hand weakness.  He did not get tPA.  Echo in 10/20 showed EF 50-55%, mild LVH, moderately decreased RV function, small PFO. Carotid dopplers showed 1-39% bilateral stenosis. He was switched from Xarelto to Eliquis.   In 11/20, he went into persistent atrial fibrillation and underwent DCCV.  In 7/21, he went into atypical flutter and had DCCV to NSR.  He had a repeat atrial fibrillation ablation in 11/21.   Echo in 1/22 showed EF 60-65%, normal RV, mild-moderate MR. DCCV to NSR in 1/22.   Given significant weight loss, patient had CT chest/abdomen/pelvis in 7/22, this showed no worrisome abnormalities.   He returns for followup of CHF, atrial fibrillation, and CAD.  He is in a junctional rhythm today (was in a junctional rhythm also when seen recently by EP).  He feels like his dyspnea is  a little better on Jardiance.  He gets short of breath easily in the heat.  No significant peripheral edema.  Still short of breath with moderate-heavy exertion.  No chest pain. Has history of esophageal stricture but minimal dysphagia now. He has not wanted to get an EGD.  SBP 120s-130s at home.   ECG (personally reviewed): Junctional rhythm, QTc 412 msec  Labs (8/12): K 4.1, creatinine 1.2, LDL 91, HDL 53 Labs (8/14): K 4.6, creatinine 1.1 Labs (11/14): K 4.6, creatinine 1.2, LDL 91, HDL 56 Labs (3/15): AST 38, ALT 32, TSH normal, BNP 237 Labs (7/15): LDL 96, HDL 48, K 4.2, creatinine 1.2, TSH normal, BNP 237, AST 38, ALT 32 Labs (8/15):  LFTs normal Labs (11/15): K 4.2, creatinine 1.1, Mg 2.2, BNP 227 Labs (2/16): K 4, creatinine 1.32, BNP 261 Labs (6/18): K 3.9, creatinine 1.6 Labs (7/18): K 3.9, creatinine 1.5, BNP 242 Labs (12/18): K 3.8, creatinine 1.4, hgb 13.9, LDL 73, HDL 52 Labs (3/19): K 3.7, creatinine 1.4, LDL 76, HDL 42 Labs (6/19): K 4.1, creatinine 1.63 Labs (3/20): K 4.2, creatinine 1.29, LDL 53, HDL 47, hgb 13.8 Labs (4/20): K 4.3, creatinine 1.5 Labs (6/20): K 4.2, creatinine 1.84, hgb 12.4 Labs (7/20): K 4.5, creatinine 2.53 => 1.72 Labs (8/20): K 4.7, creatinine 1.5, Mg 2.4 Labs (12/20): LDL 79, K 4.9, creatinine 1.4 Labs (2/21): K 4.5, creatinine 1.56 Labs (8/21): K 4, creatinine 1.4, LDL 69, HDL 52 Labs (12/21): K 4.4, creatinine 1.21, LDL 85 Labs (3/22): K 4.1, creatinine 1.6, hgb 13.3, BNP 197 Labs (7/22): creatinine 1.72   PMH: 1. CAD: 1st MI in 1988.  CABG 1996.  PCI to CFX in 2009.  LHC (11/09) SVG-dRCA patent, total occlusion RCA, patent CFX stent, atretic LIMA, serial 60 and 80% proximal LAD stenoses, EF 60% with basal inferior hypokinesis.  Myoview in 2011 with no ischemia or infarction.  Echo (10/12) with EF 55-60%, mild LVH, mild MR.  Lexiscan Cardiolite in 2013 with no ischemia or infarction.  Echo (8/14) with EF 55-60%, moderate MR, moderate TR, PA systolic pressure 35 mmHg.  Echo (4/15) with EF 60-65%, mild focal basal septal hypertrophy, inferior basal akinesis, mild MR.  Lexiscan cardiolite (11/15) with EF 61%, fixed basal inferoseptal defect, no ischemia.  - Lexiscan Cardiolite (6/18): EF 54%, no perfusion defect.  - NSTEMI 3/20, cath showed 95% ostial/proximal LAD, 60-70% mid LAD, totally occluded SVG-LCx, totally occluded RCA, 80-90% mid SVG-RCA.  Patient had DES to ostial-mid LAD and DES to SVG-RCA.  2. Raynauds syndrome 3. Post-polio syndrome 4. GERD with dilation of esophageal stricture in 12/12.  5. Hyperlipidemia 6. H/o CVAs: Brainstem stroke with Horner's syndrome.  Recurrent  stroke in 10/20 with right hand weakness when off anticoagulation for 2 days.  - Carotid dopplers (10/20) with minimal stenosis.  7. Scoliosis. 8. H/o appendectomy 9. Herpes Zoster 10. Atrial fibrillation/atypical atrial flutter: DCCV to NSR in 8/14. DCCV to NSR in 1/15. Atrial fibrillation ablation 6/15 with Tikosyn loading (at [facility]).   - Redo atrial fibrillation ablation in 6/20.  Patient also noted to have complex flutter with several foci, not ablated.  - Zio patch (7/20): Primarily NSR with a few short SVT runs.  - DCCV 11/20.  - DCCV 7/21 - Redo atrial fibrillation ablation 11/21.  - DCCV 1/22 11. PFTs (4/15) with FVC 59%, FEV1 54%, ratio 91%, DLCO 53% => moderate obstructive defect thought to be related to COPD and severe restrictive defect thought to be due to elevated  left hemidiaphragm and post-polio syndrome.  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  Echo (5/18) with EF 65-70%, RV moderately dilated with normal systolic function, PASP 57 mmHg.  - Echo (3/20): EF 45-50%, mild LV dilation, basal inferolateral and inferior hypokinesis.  - Echo (10/20): EF 50-55%, mild LVH, basal inferior and inferoseptal hypokinesis, moderately decreased RV systolic function, small PFO.  - Echo (1/22): EF 60-65%, normal RV, mild-moderate MR.  13. CKD: Stage 3.  14. OSA: dental appliance.  15. Right radial artery pseudoaneurysm: post-cath in 3/20, s/p repair.  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 H/o esophageal stricture  Current Outpatient Medications  Medication Sig Dispense Refill   acetaminophen (TYLENOL) 500 MG tablet Take 250-500 mg by mouth every 8 (eight) hours as needed for mild pain or moderate pain.      albuterol (PROAIR HFA) 108 (90 BASE) MCG/ACT inhaler Inhale 2 puffs into the lungs every 4 (four) hours as needed for wheezing or shortness of breath (and prior to exercise). 1 Inhaler 3   dicyclomine (BENTYL) 10 MG capsule TAKE ONE CAPSULE BY MOUTH 3 TIMES DAILY AS NEEDED FOR SPASMS 270 capsule 1    dofetilide (TIKOSYN) 250 MCG capsule TAKE 1 CAPSULE BY MOUTH EVERY 12 HOURS 180 capsule 3   ELIQUIS 2.5 MG TABS tablet TAKE 1 TABLET BY MOUTH TWICE A DAY 180 tablet 3   empagliflozin (JARDIANCE) 10 MG TABS tablet Take 1 tablet (10 mg total) by mouth daily before breakfast. 30 tablet 6   ezetimibe (ZETIA) 10 MG tablet TAKE 1 TABLET BY MOUTH EVERY DAY 90 tablet 3   famotidine (PEPCID) 40 MG tablet TAKE 1 TABLET BY MOUTH EVERYDAY AT BEDTIME 90 tablet 3   furosemide (LASIX) 20 MG tablet Take 1 tablet (20 mg total) by mouth every other day. 30 tablet 3   KLOR-CON M20 20 MEQ tablet TAKE 1 TABLET BY MOUTH AT BEDTIME 90 tablet 3   lactobacillus acidophilus & bulgar (LACTINEX) chewable tablet Chew 2 tablets by mouth at bedtime.      loperamide (IMODIUM) 2 MG capsule Take 2 mg by mouth See admin instructions. Take 1 capsule (2 mg) by mouth in the morning (0800) & may take an additional dose in the evening if needed for diarrhea/loose stools.     LORazepam (ATIVAN) 0.5 MG tablet Take 0.5 mg by mouth at bedtime.     methocarbamol (ROBAXIN) 500 MG tablet Take 500 mg by mouth 3 (three) times daily as needed for muscle spasms.      nitroGLYCERIN (NITROSTAT) 0.4 MG SL tablet Place 1 tablet (0.4 mg total) under the tongue every 5 (five) minutes as needed for chest pain. 25 tablet 0   Probiotic Product (ALIGN) 4 MG CAPS Take 4 mg by mouth daily. (0800)     rosuvastatin (CRESTOR) 40 MG tablet TAKE 1 TABLET BY MOUTH EVERY DAY 90 tablet 3   Testosterone POWD Apply 1 mL topically daily. Testosterone (AT) 150/0('150mg'$ ) cpd     traZODone (DESYREL) 50 MG tablet Take 50 mg by mouth at bedtime.     Vitamin D, Ergocalciferol, (DRISDOL) 50000 UNITS CAPS capsule Take 50,000 Units by mouth every 14 (fourteen) days. Take on 1st and 15th at 8p.     diltiazem (CARDIZEM CD) 240 MG 24 hr capsule Take 1 capsule (240 mg total) by mouth at bedtime. 90 capsule 3   No current facility-administered medications for this encounter.     Allergies:   Morphine and related and Tape   Social History:  The patient  reports  that he quit smoking about 54 years ago. His smoking use included cigarettes. He has a 2.50 pack-year smoking history. He quit smokeless tobacco use about 72 years ago. He reports that he does not drink alcohol and does not use drugs.   Family History:  The patient's family history includes Cancer in his son; Heart Problems in his brother; Heart disease in his father, maternal grandfather, maternal grandmother, mother, paternal grandfather, and paternal grandmother; Prostate cancer in his paternal uncle.   ROS:  Please see the history of present illness.   All other systems are personally reviewed and negative.   Exam:   BP 120/60   Pulse 62   Wt 52.7 kg (116 lb 3.2 oz)   SpO2 97%   BMI 18.76 kg/m  General: NAD Neck: JVP 8 cm, no thyromegaly or thyroid nodule.  Lungs: Decrease BS left base. CV: Nondisplaced PMI.  Heart regular S1/S2, no S3/S4, 2/6 early SEM RUSB.  No peripheral edema.  No carotid bruit.  Normal pedal pulses.  Abdomen: Soft, nontender, no hepatosplenomegaly, no distention.  Skin: Intact without lesions or rashes.  Neurologic: Alert and oriented x 3.  Psych: Normal affect. Extremities: No clubbing or cyanosis.  HEENT: Normal.    Recent Labs: 03/17/2021: ALT 29; Hemoglobin 13.3; Platelets 240 07/27/2021: B Natriuretic Peptide 306.6; BUN 15; Creatinine, Ser 1.47; Magnesium 2.6; Potassium 4.5; Sodium 136  Personally reviewed   Wt Readings from Last 3 Encounters:  07/27/21 52.7 kg (116 lb 3.2 oz)  06/24/21 52.6 kg (116 lb)  06/05/21 51.2 kg (112 lb 12.8 oz)   ASSESSMENT AND PLAN:  1. Atrial fibrillation/flutter: He has been on Tikosyn and is s/p atrial fibrillation ablation at [facility] on 06/25/14.  He has also had atypical atrial flutter. He had a redo atrial fibrillation ablation in 6/20.  Complex flutter was noted from several foci and was not ablated.  Zio patch from 7/20 showed  predominantly NSR with short runs SVT.  DCCV in 11/20, DCCV again in 7/21 and in 1/22. Redo atrial fibrillation ablation in 11/21.  He is in junctional rhythm today, rate 60s.   - Continue Tikosyn for now.  QTc today is acceptable (412 msec). Check BMET/Mg today.  - Continue apixaban, this is appropriately dosed at 2.5 mg bid with age and low weight.   - With junctional rhythm, would decrease diltiazem CD to 240 mg daily.   2. CAD: H/o CABG.  He had NSTEMI in 3/20 with DES to ostial/proximal LAD and SVG-RCA.  No chest pain.  - He will continue apixaban long-term for atrial fibrillation/flutter.  - he is on statin and Zetia. Check lipids today.  3. Hyperlipidemia: He is taking Crestor 40 mg daily and Zetia 10 mg daily.  Check lipids today.  4. Ischemic cardiomyopathy/primarily diastolic CHF: Echo in 0000000 with EF 45-50%, echo in 10/20 with EF 50-55%, echo in 1/22 with EF 60-65%.  NYHA class II symptoms.  Minimal volume overload.  - Continue Lasix at 20 mg every other day. BMET/BNP today.    - Continue Jardiance 10 mg daily. - He is off losartan with elevated creatinine.  5. Pulmonary: PFTs suggested both a restrictive and obstructive defect.  The restrictive defect is likely from post-polio syndrome and elevated left hemidiaphragm.  Mr Denk never smoked much, so the obstructive defect is more surprising.  6. OSA: Severe, likely potentiates his atrial fibrillation. He has had a hard time tolerating CPAP.   - Now using an oral appliance.   7.  CVA: 10/20, in setting of holding anticoagulation for 2 days.  Avoid holding anticoagulation in the future.  Mild residual effect on right hand dexterity.   Followup in 3 months.    Signed, [name], MD  07/27/2021   [facility] [address] [facility] [place] [address] [phone] (office) [phone] (fax)

## 2021-07-27 NOTE — Patient Instructions (Signed)
EKG done today.   Labs done today. We will contact you only if your labs are abnormal.  DECREASE Diltiazem CD to '240mg'$  (1 tablet) by mouth nightly.   No other medication changes were made. Please continue all current medications as prescribed.  Your physician recommends that you schedule a follow-up appointment in: 3 months  If you have any questions or concerns before your next appointment please send [place] a message through [place] or call our office at [phone].    TO LEAVE A MESSAGE FOR THE NURSE SELECT OPTION 2, PLEASE LEAVE A MESSAGE INCLUDING: YOUR NAME DATE OF BIRTH CALL BACK NUMBER REASON FOR CALL**this is important as we prioritize the call backs  YOU WILL RECEIVE A CALL BACK THE SAME DAY AS LONG AS YOU CALL BEFORE 4:00 PM   Do the following things EVERYDAY: Weigh yourself in the morning before breakfast. Write it down and keep it in a log. Take your medicines as prescribed Eat low salt foods--Limit salt (sodium) to 2000 mg per day.  Stay as active as you can everyday Limit all fluids for the day to less than 2 liters   At the [facility], you and your health needs are our priority. As part of our continuing mission to provide you with exceptional heart care, we have created designated Provider Care Teams. These Care Teams include your primary Cardiologist (physician) and Advanced Practice Providers (APPs- Physician Assistants and Nurse Practitioners) who all work together to provide you with the care you need, when you need it.   You may see any of the following providers on your designated Care Team at your next follow up: Dr Glori Bickers Dr Haynes Kerns, NP Lyda Jester, [place] Audry Riles, PharmD   Please be sure to bring in all your medications bottles to every appointment.

## 2021-08-12 ENCOUNTER — Other Ambulatory Visit: Payer: Self-pay | Admitting: Gastroenterology

## 2021-10-28 ENCOUNTER — Ambulatory Visit (HOSPITAL_COMMUNITY)
Admission: RE | Admit: 2021-10-28 | Discharge: 2021-10-28 | Disposition: A | Discharge status: Home / Self Care | Payer: Medicare Other | Source: Ambulatory Visit | Attending: Cardiology | Admitting: Cardiology

## 2021-10-28 ENCOUNTER — Other Ambulatory Visit: Payer: Self-pay

## 2021-10-28 ENCOUNTER — Encounter (HOSPITAL_COMMUNITY): Payer: Self-pay | Admitting: Cardiology

## 2021-10-28 VITALS — BP 144/64 | HR 72 | Wt 113.8 lb

## 2021-10-28 DIAGNOSIS — I5032 Chronic diastolic (congestive) heart failure: Secondary | ICD-10-CM | POA: Diagnosis present

## 2021-10-28 DIAGNOSIS — Z8249 Family history of ischemic heart disease and other diseases of the circulatory system: Secondary | ICD-10-CM | POA: Diagnosis not present

## 2021-10-28 DIAGNOSIS — Z8673 Personal history of transient ischemic attack (TIA), and cerebral infarction without residual deficits: Secondary | ICD-10-CM | POA: Insufficient documentation

## 2021-10-28 DIAGNOSIS — Z951 Presence of aortocoronary bypass graft: Secondary | ICD-10-CM | POA: Diagnosis not present

## 2021-10-28 DIAGNOSIS — Z79899 Other long term (current) drug therapy: Secondary | ICD-10-CM | POA: Diagnosis not present

## 2021-10-28 DIAGNOSIS — I4891 Unspecified atrial fibrillation: Secondary | ICD-10-CM | POA: Insufficient documentation

## 2021-10-28 DIAGNOSIS — I484 Atypical atrial flutter: Secondary | ICD-10-CM | POA: Insufficient documentation

## 2021-10-28 DIAGNOSIS — I255 Ischemic cardiomyopathy: Secondary | ICD-10-CM | POA: Diagnosis not present

## 2021-10-28 DIAGNOSIS — Z7901 Long term (current) use of anticoagulants: Secondary | ICD-10-CM | POA: Diagnosis not present

## 2021-10-28 DIAGNOSIS — Z885 Allergy status to narcotic agent status: Secondary | ICD-10-CM | POA: Insufficient documentation

## 2021-10-28 DIAGNOSIS — Z955 Presence of coronary angioplasty implant and graft: Secondary | ICD-10-CM | POA: Insufficient documentation

## 2021-10-28 DIAGNOSIS — Z7984 Long term (current) use of oral hypoglycemic drugs: Secondary | ICD-10-CM | POA: Diagnosis not present

## 2021-10-28 DIAGNOSIS — G4733 Obstructive sleep apnea (adult) (pediatric): Secondary | ICD-10-CM | POA: Diagnosis not present

## 2021-10-28 DIAGNOSIS — I252 Old myocardial infarction: Secondary | ICD-10-CM | POA: Insufficient documentation

## 2021-10-28 DIAGNOSIS — I2510 Atherosclerotic heart disease of native coronary artery without angina pectoris: Secondary | ICD-10-CM | POA: Diagnosis not present

## 2021-10-28 DIAGNOSIS — E785 Hyperlipidemia, unspecified: Secondary | ICD-10-CM | POA: Diagnosis not present

## 2021-10-28 LAB — CBC
HCT: 48.7 % (ref 39.0–52.0)
Hemoglobin: 15.5 g/dL (ref 13.0–17.0)
MCH: 29.5 pg (ref 26.0–34.0)
MCHC: 31.8 g/dL (ref 30.0–36.0)
MCV: 92.8 fL (ref 80.0–100.0)
Platelets: 156 10*3/uL (ref 150–400)
RBC: 5.25 MIL/uL (ref 4.22–5.81)
RDW: 17.3 % — ABNORMAL HIGH (ref 11.5–15.5)
WBC: 7.3 10*3/uL (ref 4.0–10.5)
nRBC: 0 % (ref 0.0–0.2)

## 2021-10-28 LAB — BASIC METABOLIC PANEL
Anion gap: 8 (ref 5–15)
BUN: 21 mg/dL (ref 8–23)
CO2: 28 mmol/L (ref 22–32)
Calcium: 10.3 mg/dL (ref 8.9–10.3)
Chloride: 101 mmol/L (ref 98–111)
Creatinine, Ser: 1.36 mg/dL — ABNORMAL HIGH (ref 0.61–1.24)
GFR, Estimated: 52 mL/min — ABNORMAL LOW (ref >60)
Glucose, Bld: 92 mg/dL (ref 70–99)
Potassium: 4.3 mmol/L (ref 3.5–5.1)
Sodium: 137 mmol/L (ref 135–145)

## 2021-10-28 LAB — BRAIN NATRIURETIC PEPTIDE: B Natriuretic Peptide: 147.6 pg/mL — ABNORMAL HIGH (ref 0.0–100.0)

## 2021-10-28 LAB — MAGNESIUM: Magnesium: 2.5 mg/dL — ABNORMAL HIGH (ref 1.7–2.4)

## 2021-10-28 NOTE — Progress Notes (Signed)
ID:  [name], DOB [birth year], MRN [id number]  Provider location: [place] [facility] Type of Visit: Established patient  PCP:  Burnard Bunting, MD  Cardiologist:  [name]   History of Present Illness: [name] is a 85 y.o. male who has history of CAD s/p CABG.  Cath in 11/09 showed that the SVG-distal RCA was patent, the CFX system was patent.  His LIMA was atretic and there were serial 60% and 80% stenoses in the native LAD.  He was managed medically.  Echo in 8/14 showed EF 55-60% with moderate MR and moderate TR.    He was initially noted to have atrial fibrillation in the summer of 2014. He was started on Xarelto and cardioverted to NSR in 8/14.  Recurrent atrial fibrillation was noted in 1/15, and he was cardioverted to NSR again.  This time, NSR did not hold long. By 5/15, he was in persistent atrial fibrillation.  I referred him to [facility] where he had atrial fibrillation ablation and Tikosyn loading.  Ranolazine was stopped due to risk of QT prolongation and Imdur was started as an anti-anginal instead.  Lexiscan Cardiolite in 11/15 showed no ischemia.     CTA chest done in 2/16 to look for evidence for PV stenosis post-AF ablation.  This showed mild short-segment narrowing of the left inferior pulmonary vein.    Patient was admitted in 5/18 with acute on chronic diastolic CHF.  He had been at the beach for [place] days and ate out a number of times, probably getting a significant sodium load.  No chest pain.  He was in normal sinus rhythm.  He was started on IV Lasix and diuresed.  Echo in 5/18 showed EF 65-70% with moderately dilated RV and normal systolic function, PASP 57 mmHg.  Lexiscan Cardiolite in 6/18 showed no significant perfusion defect.    He was admitted with NSTEMI in 3/20, had DES to ostial LAD and mid SVG-RCA.  Echo showed EF 45-50%.  He was in atypical atrial flutter persistently despite Tikosyn.  After his intervention, he was noted to  have a radial artery pseudoaneurysm that was repaired by Dr. Trula Slade.   In 6/20, he had a redo atrial fibrillation ablation.  He was also noted to have complex flutter from multiple foci that was not ablated.  After the procedure, he has had considerable pain in the right leg at the cath site but also radiating down the leg, groin US showed no AV fistula or pseudoaneurysm.    In 10/20, patient held anticoagulation for 2 days for oral surgery and had a CVA presenting as right hand weakness.  He did not get tPA.  Echo in 10/20 showed EF 50-55%, mild LVH, moderately decreased RV function, small PFO. Carotid dopplers showed 1-39% bilateral stenosis. He was switched from Xarelto to Eliquis.   In 11/20, he went into persistent atrial fibrillation and underwent DCCV.  In 7/21, he went into atypical flutter and had DCCV to NSR.  He had a repeat atrial fibrillation ablation in 11/21.   Echo in 1/22 showed EF 60-65%, normal RV, mild-moderate MR. DCCV to NSR in 1/22.   Given significant weight loss, patient had CT chest/abdomen/pelvis in 7/22, this showed no worrisome abnormalities.   He returns for followup of CHF, atrial fibrillation, and CAD.  He is in a junctional rhythm today, this has been persistent.  Less wheezing and cough since Jardiance started.  Still working about 5 hrs  a day.  No dyspnea walking short distances but fatigues walking around large stores like [employer].  OK climbing 1 flight of stairs.  No chest pain.  No orthopnea/PND. SBP has been running a bit higher, 130s-140s.   ECG (personally reviewed): Junctional rhythm, QTc 460 msec  Labs (8/12): K 4.1, creatinine 1.2, LDL 91, HDL 53 Labs (8/14): K 4.6, creatinine 1.1 Labs (11/14): K 4.6, creatinine 1.2, LDL 91, HDL 56 Labs (3/15): AST 38, ALT 32, TSH normal, BNP 237 Labs (7/15): LDL 96, HDL 48, K 4.2, creatinine 1.2, TSH normal, BNP 237, AST 38, ALT 32 Labs (8/15): LFTs normal Labs (11/15): K 4.2, creatinine 1.1, Mg 2.2, BNP 227 Labs  (2/16): K 4, creatinine 1.32, BNP 261 Labs (6/18): K 3.9, creatinine 1.6 Labs (7/18): K 3.9, creatinine 1.5, BNP 242 Labs (12/18): K 3.8, creatinine 1.4, hgb 13.9, LDL 73, HDL 52 Labs (3/19): K 3.7, creatinine 1.4, LDL 76, HDL 42 Labs (6/19): K 4.1, creatinine 1.63 Labs (3/20): K 4.2, creatinine 1.29, LDL 53, HDL 47, hgb 13.8 Labs (4/20): K 4.3, creatinine 1.5 Labs (6/20): K 4.2, creatinine 1.84, hgb 12.4 Labs (7/20): K 4.5, creatinine 2.53 => 1.72 Labs (8/20): K 4.7, creatinine 1.5, Mg 2.4 Labs (12/20): LDL 79, K 4.9, creatinine 1.4 Labs (2/21): K 4.5, creatinine 1.56 Labs (8/21): K 4, creatinine 1.4, LDL 69, HDL 52 Labs (12/21): K 4.4, creatinine 1.21, LDL 85 Labs (3/22): K 4.1, creatinine 1.6, hgb 13.3, BNP 197 Labs (7/22): creatinine 1.72 Labs (8/22): LDL 74, HDL 50, BNP 307, K 4.5, creatinine 1.47   PMH: 1. CAD: 1st MI in 1988.  CABG 1996.  PCI to CFX in 2009.  LHC (11/09) SVG-dRCA patent, total occlusion RCA, patent CFX stent, atretic LIMA, serial 60 and 80% proximal LAD stenoses, EF 60% with basal inferior hypokinesis.  Myoview in 2011 with no ischemia or infarction.  Echo (10/12) with EF 55-60%, mild LVH, mild MR.  Lexiscan Cardiolite in 2013 with no ischemia or infarction.  Echo (8/14) with EF 55-60%, moderate MR, moderate TR, PA systolic pressure 35 mmHg.  Echo (4/15) with EF 60-65%, mild focal basal septal hypertrophy, inferior basal akinesis, mild MR.  Lexiscan cardiolite (11/15) with EF 61%, fixed basal inferoseptal defect, no ischemia.  - Lexiscan Cardiolite (6/18): EF 54%, no perfusion defect.  - NSTEMI 3/20, cath showed 95% ostial/proximal LAD, 60-70% mid LAD, totally occluded SVG-LCx, totally occluded RCA, 80-90% mid SVG-RCA.  Patient had DES to ostial-mid LAD and DES to SVG-RCA.  2. Raynauds syndrome 3. Post-polio syndrome 4. GERD with dilation of esophageal stricture in 12/12.  5. Hyperlipidemia 6. H/o CVAs: Brainstem stroke with Horner's syndrome.  Recurrent stroke in  10/20 with right hand weakness when off anticoagulation for 2 days.  - Carotid dopplers (10/20) with minimal stenosis.  7. Scoliosis. 8. H/o appendectomy 9. Herpes Zoster 10. Atrial fibrillation/atypical atrial flutter: DCCV to NSR in 8/14. DCCV to NSR in 1/15. Atrial fibrillation ablation 6/15 with Tikosyn loading (at [facility]).   - Redo atrial fibrillation ablation in 6/20.  Patient also noted to have complex flutter with several foci, not ablated.  - Zio patch (7/20): Primarily NSR with a few short SVT runs.  - DCCV 11/20.  - DCCV 7/21 - Redo atrial fibrillation ablation 11/21.  - DCCV 1/22 11. PFTs (4/15) with FVC 59%, FEV1 54%, ratio 91%, DLCO 53% => moderate obstructive defect thought to be related to COPD and severe restrictive defect thought to be due to elevated left hemidiaphragm and post-polio  syndrome.  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  Echo (5/18) with EF 65-70%, RV moderately dilated with normal systolic function, PASP 57 mmHg.  - Echo (3/20): EF 45-50%, mild LV dilation, basal inferolateral and inferior hypokinesis.  - Echo (10/20): EF 50-55%, mild LVH, basal inferior and inferoseptal hypokinesis, moderately decreased RV systolic function, small PFO.  - Echo (1/22): EF 60-65%, normal RV, mild-moderate MR.  13. CKD: Stage 3.  14. OSA: dental appliance.  15. Right radial artery pseudoaneurysm: post-cath in 3/20, s/p repair.  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 H/o esophageal stricture  Current Outpatient Medications  Medication Sig Dispense Refill   acetaminophen (TYLENOL) 500 MG tablet Take 250-500 mg by mouth every 8 (eight) hours as needed for mild pain or moderate pain.      albuterol (PROAIR HFA) 108 (90 BASE) MCG/ACT inhaler Inhale 2 puffs into the lungs every 4 (four) hours as needed for wheezing or shortness of breath (and prior to exercise). 1 Inhaler 3   dicyclomine (BENTYL) 10 MG capsule TAKE ONE CAPSULE BY MOUTH 3 TIMES DAILY AS NEEDED FOR SPASMS 270 capsule 1   diltiazem  (CARDIZEM CD) 240 MG 24 hr capsule Take 1 capsule (240 mg total) by mouth at bedtime. 90 capsule 3   dofetilide (TIKOSYN) 250 MCG capsule TAKE 1 CAPSULE BY MOUTH EVERY 12 HOURS 180 capsule 3   ELIQUIS 2.5 MG TABS tablet TAKE 1 TABLET BY MOUTH TWICE A DAY 180 tablet 3   empagliflozin (JARDIANCE) 10 MG TABS tablet Take 1 tablet (10 mg total) by mouth daily before breakfast. 30 tablet 6   ezetimibe (ZETIA) 10 MG tablet TAKE 1 TABLET BY MOUTH EVERY DAY 90 tablet 3   famotidine (PEPCID) 40 MG tablet TAKE 1 TABLET BY MOUTH EVERYDAY AT BEDTIME 90 tablet 3   furosemide (LASIX) 20 MG tablet Take 1 tablet (20 mg total) by mouth every other day. 30 tablet 3   KLOR-CON M20 20 MEQ tablet TAKE 1 TABLET BY MOUTH AT BEDTIME 90 tablet 3   lactobacillus acidophilus & bulgar (LACTINEX) chewable tablet Chew 2 tablets by mouth at bedtime.      loperamide (IMODIUM) 2 MG capsule Take 2 mg by mouth See admin instructions. Take 1 capsule (2 mg) by mouth in the morning (0800) & may take an additional dose in the evening if needed for diarrhea/loose stools.     LORazepam (ATIVAN) 0.5 MG tablet Take 0.5 mg by mouth at bedtime.     methocarbamol (ROBAXIN) 500 MG tablet Take 500 mg by mouth 3 (three) times daily as needed for muscle spasms.      nitroGLYCERIN (NITROSTAT) 0.4 MG SL tablet Place 1 tablet (0.4 mg total) under the tongue every 5 (five) minutes as needed for chest pain. 25 tablet 0   Probiotic Product (ALIGN) 4 MG CAPS Take 4 mg by mouth daily. (0800)     rosuvastatin (CRESTOR) 40 MG tablet TAKE 1 TABLET BY MOUTH EVERY DAY 90 tablet 3   Testosterone POWD Apply 1 mL topically daily. Testosterone (AT) 150/0(150mg ) cpd     traZODone (DESYREL) 50 MG tablet Take 50 mg by mouth at bedtime.     Vitamin D, Ergocalciferol, (DRISDOL) 50000 UNITS CAPS capsule Take 50,000 Units by mouth every 14 (fourteen) days. Take on 1st and 15th at 8p.     No current facility-administered medications for this encounter.    Allergies:    Morphine and related and Tape   Social History:  The patient  reports that he quit smoking  about 54 years ago. His smoking use included cigarettes. He has a 2.50 pack-year smoking history. He quit smokeless tobacco use about 72 years ago. He reports that he does not drink alcohol and does not use drugs.   Family History:  The patient's family history includes Cancer in his son; Heart Problems in his brother; Heart disease in his father, maternal grandfather, maternal grandmother, mother, paternal grandfather, and paternal grandmother; Prostate cancer in his paternal uncle.   ROS:  Please see the history of present illness.   All other systems are personally reviewed and negative.   Exam:   BP (!) 144/64   Pulse 72   Wt 51.6 kg (113 lb 12.8 oz)   SpO2 97%   BMI 18.37 kg/m  General: NAD Neck: No JVD, no thyromegaly or thyroid nodule.  Lungs: Decreased BS at left base.  CV: Nondisplaced PMI.  Heart regular S1/S2, no S3/S4, no murmur.  No peripheral edema.  No carotid bruit.  Normal pedal pulses.  Abdomen: Soft, nontender, no hepatosplenomegaly, no distention.  Skin: Intact without lesions or rashes.  Neurologic: Alert and oriented x 3.  Psych: Normal affect. Extremities: No clubbing or cyanosis.  HEENT: Normal.   Recent Labs: 03/17/2021: ALT 29 10/28/2021: B Natriuretic Peptide 147.6; BUN 21; Creatinine, Ser 1.36; Hemoglobin 15.5; Magnesium 2.5; Platelets 156; Potassium 4.3; Sodium 137  Personally reviewed   Wt Readings from Last 3 Encounters:  10/28/21 51.6 kg (113 lb 12.8 oz)  07/27/21 52.7 kg (116 lb 3.2 oz)  06/24/21 52.6 kg (116 lb)   ASSESSMENT AND PLAN:  1. Atrial fibrillation/flutter: He has been on Tikosyn and is s/p atrial fibrillation ablation at [facility] on 06/25/14.  He has also had atypical atrial flutter. He had a redo atrial fibrillation ablation in 6/20.  Complex flutter was noted from several foci and was not ablated.  Zio patch from 7/20 showed predominantly NSR with  short runs SVT.  DCCV in 11/20, DCCV again in 7/21 and in 1/22. Redo atrial fibrillation ablation in 11/21.  He is in junctional rhythm today, similar to the last couple appts.    - Continue Tikosyn for now.  QTc today is acceptable (460 msec). Check BMET/Mg today.  - Continue apixaban, this is appropriately dosed at 2.5 mg bid with age and low weight.   - Can continue current diltiazem CD.  2. CAD: H/o CABG.  He had NSTEMI in 3/20 with DES to ostial/proximal LAD and SVG-RCA.  No chest pain.  - He will continue apixaban long-term for atrial fibrillation/flutter.  - He is on statin and Zetia. Good lipids in 8/22.  3. Hyperlipidemia: He is taking Crestor 40 mg daily and Zetia 10 mg daily.  Good lipids in 8/22.  4. Ischemic cardiomyopathy/primarily diastolic CHF: Echo in 8/29 with EF 45-50%, echo in 10/20 with EF 50-55%, echo in 1/22 with EF 60-65%.  NYHA class II symptoms.  Not volume overloaded.  - Continue Lasix at 20 mg every other day. BMET/BNP today.    - Continue Jardiance 10 mg daily. - He is off losartan with elevated creatinine.  5. Pulmonary: PFTs suggested both a restrictive and obstructive defect.  The restrictive defect is likely from post-polio syndrome and elevated left hemidiaphragm.  Mr Gerry never smoked much, so the obstructive defect is more surprising.  6. OSA: Severe, likely potentiates his atrial fibrillation. Unable to tolerate CPAP or oral appliance.   7. CVA: 10/20, in setting of holding anticoagulation for 2 days.  Avoid holding anticoagulation  in the future.  Mild residual effect on right hand dexterity.   Followup in 3 months.   Signed, [name], MD  10/28/2021   [place] [address] [facility] [place] [address] [phone] (office) [phone] (fax)

## 2021-10-28 NOTE — Patient Instructions (Signed)
Labs done today, your results will be available in MyChart, we will contact you for abnormal readings.  Your physician recommends that you schedule a follow-up appointment in: 3 months  If you have any questions or concerns before your next appointment please send us a message through mychart or call our office at [phone].    TO LEAVE A MESSAGE FOR THE NURSE SELECT OPTION 2, PLEASE LEAVE A MESSAGE INCLUDING: YOUR NAME DATE OF BIRTH CALL BACK NUMBER REASON FOR CALL**this is important as we prioritize the call backs  YOU WILL RECEIVE A CALL BACK THE SAME DAY AS LONG AS YOU CALL BEFORE 4:00 PM  At the [facility], you and your health needs are our priority. As part of our continuing mission to provide you with exceptional heart care, we have created designated Provider Care Teams. These Care Teams include your primary Cardiologist (physician) and Advanced Practice Providers (APPs- Physician Assistants and Nurse Practitioners) who all work together to provide you with the care you need, when you need it.   You may see any of the following providers on your designated Care Team at your next follow up: Dr Daniel Bensimhon Dr Dalton McLean Amy Clegg, NP Brittainy Simmons, PA Jessica Milford,NP Lindsay Finch, PA Lauren Kemp, PharmD   Please be sure to bring in all your medications bottles to every appointment.    

## 2021-11-01 ENCOUNTER — Other Ambulatory Visit (HOSPITAL_COMMUNITY): Payer: Self-pay | Admitting: Cardiology

## 2021-11-03 ENCOUNTER — Other Ambulatory Visit (HOSPITAL_COMMUNITY): Payer: Self-pay | Admitting: *Deleted

## 2021-11-03 MED ORDER — POTASSIUM CHLORIDE ER 10 MEQ PO TBCR: 20.0000 meq | EXTENDED_RELEASE_TABLET | Freq: Every day | ORAL | 3 refills | Status: DC

## 2021-11-03 MED ORDER — EMPAGLIFLOZIN 10 MG PO TABS: ORAL_TABLET | ORAL | 3 refills | Status: DC

## 2021-12-29 ENCOUNTER — Ambulatory Visit: Payer: Medicare Other | Admitting: Cardiology

## 2022-01-05 ENCOUNTER — Other Ambulatory Visit (HOSPITAL_COMMUNITY): Payer: Self-pay | Admitting: Cardiology

## 2022-01-28 ENCOUNTER — Ambulatory Visit (HOSPITAL_COMMUNITY)
Admission: RE | Admit: 2022-01-28 | Discharge: 2022-01-28 | Disposition: A | Discharge status: Home / Self Care | Payer: Medicare Other | Source: Ambulatory Visit | Attending: Cardiology | Admitting: Cardiology

## 2022-01-28 ENCOUNTER — Other Ambulatory Visit: Payer: Self-pay

## 2022-01-28 ENCOUNTER — Encounter (HOSPITAL_COMMUNITY): Payer: Self-pay | Admitting: Cardiology

## 2022-01-28 VITALS — BP 150/70 | HR 69 | Wt 109.6 lb

## 2022-01-28 DIAGNOSIS — I5032 Chronic diastolic (congestive) heart failure: Secondary | ICD-10-CM | POA: Diagnosis not present

## 2022-01-28 DIAGNOSIS — G4733 Obstructive sleep apnea (adult) (pediatric): Secondary | ICD-10-CM | POA: Insufficient documentation

## 2022-01-28 DIAGNOSIS — I4891 Unspecified atrial fibrillation: Secondary | ICD-10-CM | POA: Insufficient documentation

## 2022-01-28 DIAGNOSIS — R634 Abnormal weight loss: Secondary | ICD-10-CM | POA: Insufficient documentation

## 2022-01-28 DIAGNOSIS — Z79899 Other long term (current) drug therapy: Secondary | ICD-10-CM | POA: Insufficient documentation

## 2022-01-28 DIAGNOSIS — N183 Chronic kidney disease, stage 3 unspecified: Secondary | ICD-10-CM | POA: Diagnosis not present

## 2022-01-28 DIAGNOSIS — Z955 Presence of coronary angioplasty implant and graft: Secondary | ICD-10-CM | POA: Diagnosis not present

## 2022-01-28 DIAGNOSIS — Z681 Body mass index (BMI) 19 or less, adult: Secondary | ICD-10-CM | POA: Insufficient documentation

## 2022-01-28 DIAGNOSIS — Z7984 Long term (current) use of oral hypoglycemic drugs: Secondary | ICD-10-CM | POA: Diagnosis not present

## 2022-01-28 DIAGNOSIS — Z8673 Personal history of transient ischemic attack (TIA), and cerebral infarction without residual deficits: Secondary | ICD-10-CM | POA: Diagnosis not present

## 2022-01-28 DIAGNOSIS — Z7901 Long term (current) use of anticoagulants: Secondary | ICD-10-CM | POA: Diagnosis not present

## 2022-01-28 DIAGNOSIS — I255 Ischemic cardiomyopathy: Secondary | ICD-10-CM | POA: Insufficient documentation

## 2022-01-28 DIAGNOSIS — Z09 Encounter for follow-up examination after completed treatment for conditions other than malignant neoplasm: Secondary | ICD-10-CM | POA: Diagnosis not present

## 2022-01-28 DIAGNOSIS — R002 Palpitations: Secondary | ICD-10-CM | POA: Diagnosis not present

## 2022-01-28 DIAGNOSIS — E785 Hyperlipidemia, unspecified: Secondary | ICD-10-CM | POA: Diagnosis not present

## 2022-01-28 DIAGNOSIS — I484 Atypical atrial flutter: Secondary | ICD-10-CM | POA: Diagnosis not present

## 2022-01-28 DIAGNOSIS — I252 Old myocardial infarction: Secondary | ICD-10-CM | POA: Insufficient documentation

## 2022-01-28 DIAGNOSIS — I495 Sick sinus syndrome: Secondary | ICD-10-CM | POA: Insufficient documentation

## 2022-01-28 DIAGNOSIS — I480 Paroxysmal atrial fibrillation: Secondary | ICD-10-CM

## 2022-01-28 DIAGNOSIS — R42 Dizziness and giddiness: Secondary | ICD-10-CM | POA: Diagnosis present

## 2022-01-28 DIAGNOSIS — I2510 Atherosclerotic heart disease of native coronary artery without angina pectoris: Secondary | ICD-10-CM | POA: Diagnosis not present

## 2022-01-28 DIAGNOSIS — Z951 Presence of aortocoronary bypass graft: Secondary | ICD-10-CM | POA: Insufficient documentation

## 2022-01-28 LAB — BASIC METABOLIC PANEL
Anion gap: 10 (ref 5–15)
BUN: 32 mg/dL — ABNORMAL HIGH (ref 8–23)
CO2: 29 mmol/L (ref 22–32)
Calcium: 10.9 mg/dL — ABNORMAL HIGH (ref 8.9–10.3)
Chloride: 99 mmol/L (ref 98–111)
Creatinine, Ser: 1.33 mg/dL — ABNORMAL HIGH (ref 0.61–1.24)
GFR, Estimated: 53 mL/min — ABNORMAL LOW (ref >60)
Glucose, Bld: 97 mg/dL (ref 70–99)
Potassium: 5.2 mmol/L — ABNORMAL HIGH (ref 3.5–5.1)
Sodium: 138 mmol/L (ref 135–145)

## 2022-01-28 LAB — BRAIN NATRIURETIC PEPTIDE: B Natriuretic Peptide: 165.5 pg/mL — ABNORMAL HIGH (ref 0.0–100.0)

## 2022-01-28 LAB — MAGNESIUM: Magnesium: 2.5 mg/dL — ABNORMAL HIGH (ref 1.7–2.4)

## 2022-01-28 NOTE — Progress Notes (Signed)
ID:  [name], DOB 1936-06-14, MRN [id number]  Provider location: [facility] Type of Visit: Established patient  PCP:  Burnard Bunting, MD  Cardiologist:  [name]   History of Present Illness: [name] is a 86 y.o. male who has history of CAD s/p CABG.  Cath in 11/09 showed that the SVG-distal RCA was patent, the CFX system was patent.  His LIMA was atretic and there were serial 60% and 80% stenoses in the native LAD.  He was managed medically.  Echo in 8/14 showed EF 55-60% with moderate MR and moderate TR.    He was initially noted to have atrial fibrillation in the summer of 2014. He was started on Xarelto and cardioverted to NSR in 8/14.  Recurrent atrial fibrillation was noted in 1/15, and he was cardioverted to NSR again.  This time, NSR did not hold long. By 5/15, he was in persistent atrial fibrillation.  I referred him to [facility] where he had atrial fibrillation ablation and Tikosyn loading.  Ranolazine was stopped due to risk of QT prolongation and Imdur was started as an anti-anginal instead.  Lexiscan Cardiolite in 11/15 showed no ischemia.     CTA chest done in 2/16 to look for evidence for PV stenosis post-AF ablation.  This showed mild short-segment narrowing of the left inferior pulmonary vein.    Patient was admitted in 5/18 with acute on chronic diastolic CHF.  He had been at the beach for several days and ate out a number of times, probably getting a significant sodium load.  No chest pain.  He was in normal sinus rhythm.  He was started on IV Lasix and diuresed.  Echo in 5/18 showed EF 65-70% with moderately dilated RV and normal systolic function, PASP 57 mmHg.  Lexiscan Cardiolite in 6/18 showed no significant perfusion defect.    He was admitted with NSTEMI in 3/20, had DES to ostial LAD and mid SVG-RCA.  Echo showed EF 45-50%.  He was in atypical atrial flutter persistently despite Tikosyn.  After his intervention, he was noted to  have a radial artery pseudoaneurysm that was repaired by Dr. Trula Slade.   In 6/20, he had a redo atrial fibrillation ablation.  He was also noted to have complex flutter from multiple foci that was not ablated.  After the procedure, he has had considerable pain in the right leg at the cath site but also radiating down the leg, groin US showed no AV fistula or pseudoaneurysm.    In 10/20, patient held anticoagulation for 2 days for oral surgery and had a CVA presenting as right hand weakness.  He did not get tPA.  Echo in 10/20 showed EF 50-55%, mild LVH, moderately decreased RV function, small PFO. Carotid dopplers showed 1-39% bilateral stenosis. He was switched from Xarelto to Eliquis.   In 11/20, he went into persistent atrial fibrillation and underwent DCCV.  In 7/21, he went into atypical flutter and had DCCV to NSR.  He had a repeat atrial fibrillation ablation in 11/21.   Echo in 1/22 showed EF 60-65%, normal RV, mild-moderate MR. DCCV to NSR in 1/22.   Given significant weight loss, patient had CT chest/abdomen/pelvis in 7/22, this showed no worrisome abnormalities.   He returns for followup of CHF, atrial fibrillation, and CAD.  He has been stable symptomatically.  Occasional lightheadedness, no falls.  BP is quite variable, anywhere from 157W-620B systolic on the readings he brings in.  He is not taking Lasix regularly, feels like Jardiance keeps fluid off of him well. No chest pain.  Occasional palpitations.  Generally, no dyspnea walking on flat ground.   ECG (personally reviewed): Wandering atrial pacemaker, QTc 471 msec  Labs (8/12): K 4.1, creatinine 1.2, LDL 91, HDL 53 Labs (8/14): K 4.6, creatinine 1.1 Labs (11/14): K 4.6, creatinine 1.2, LDL 91, HDL 56 Labs (3/15): AST 38, ALT 32, TSH normal, BNP 237 Labs (7/15): LDL 96, HDL 48, K 4.2, creatinine 1.2, TSH normal, BNP 237, AST 38, ALT 32 Labs (8/15): LFTs normal Labs (11/15): K 4.2, creatinine 1.1, Mg 2.2, BNP 227 Labs (2/16): K  4, creatinine 1.32, BNP 261 Labs (6/18): K 3.9, creatinine 1.6 Labs (7/18): K 3.9, creatinine 1.5, BNP 242 Labs (12/18): K 3.8, creatinine 1.4, hgb 13.9, LDL 73, HDL 52 Labs (3/19): K 3.7, creatinine 1.4, LDL 76, HDL 42 Labs (6/19): K 4.1, creatinine 1.63 Labs (3/20): K 4.2, creatinine 1.29, LDL 53, HDL 47, hgb 13.8 Labs (4/20): K 4.3, creatinine 1.5 Labs (6/20): K 4.2, creatinine 1.84, hgb 12.4 Labs (7/20): K 4.5, creatinine 2.53 => 1.72 Labs (8/20): K 4.7, creatinine 1.5, Mg 2.4 Labs (12/20): LDL 79, K 4.9, creatinine 1.4 Labs (2/21): K 4.5, creatinine 1.56 Labs (8/21): K 4, creatinine 1.4, LDL 69, HDL 52 Labs (12/21): K 4.4, creatinine 1.21, LDL 85 Labs (3/22): K 4.1, creatinine 1.6, hgb 13.3, BNP 197 Labs (7/22): creatinine 1.72 Labs (8/22): LDL 74, HDL 50, BNP 307, K 4.5, creatinine 1.47 Labs (12/22): LDL 74, HDL 49, K 4.1, creatinine 1.5   PMH: 1. CAD: 1st MI in 1988.  CABG 1996.  PCI to CFX in 2009.  LHC (11/09) SVG-dRCA patent, total occlusion RCA, patent CFX stent, atretic LIMA, serial 60 and 80% proximal LAD stenoses, EF 60% with basal inferior hypokinesis.  Myoview in 2011 with no ischemia or infarction.  Echo (10/12) with EF 55-60%, mild LVH, mild MR.  Lexiscan Cardiolite in 2013 with no ischemia or infarction.  Echo (8/14) with EF 55-60%, moderate MR, moderate TR, PA systolic pressure 35 mmHg.  Echo (4/15) with EF 60-65%, mild focal basal septal hypertrophy, inferior basal akinesis, mild MR.  Lexiscan cardiolite (11/15) with EF 61%, fixed basal inferoseptal defect, no ischemia.  - Lexiscan Cardiolite (6/18): EF 54%, no perfusion defect.  - NSTEMI 3/20, cath showed 95% ostial/proximal LAD, 60-70% mid LAD, totally occluded SVG-LCx, totally occluded RCA, 80-90% mid SVG-RCA.  Patient had DES to ostial-mid LAD and DES to SVG-RCA.  2. Raynauds syndrome 3. Post-polio syndrome 4. GERD with dilation of esophageal stricture in 12/12.  5. Hyperlipidemia 6. H/o CVAs: Brainstem stroke  with Horner's syndrome.  Recurrent stroke in 10/20 with right hand weakness when off anticoagulation for 2 days.  - Carotid dopplers (10/20) with minimal stenosis.  7. Scoliosis. 8. H/o appendectomy 9. Herpes Zoster 10. Atrial fibrillation/atypical atrial flutter: DCCV to NSR in 8/14. DCCV to NSR in 1/15. Atrial fibrillation ablation 6/15 with Tikosyn loading (at [facility]).   - Redo atrial fibrillation ablation in 6/20.  Patient also noted to have complex flutter with several foci, not ablated.  - Zio patch (7/20): Primarily NSR with a few short SVT runs.  - DCCV 11/20.  - DCCV 7/21 - Redo atrial fibrillation ablation 11/21.  - DCCV 1/22 11. PFTs (4/15) with FVC 59%, FEV1 54%, ratio 91%, DLCO 53% => moderate obstructive defect thought to be related to COPD and severe restrictive defect thought to be due to elevated left hemidiaphragm  and post-polio syndrome.  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  Echo (5/18) with EF 65-70%, RV moderately dilated with normal systolic function, PASP 57 mmHg.  - Echo (3/20): EF 45-50%, mild LV dilation, basal inferolateral and inferior hypokinesis.  - Echo (10/20): EF 50-55%, mild LVH, basal inferior and inferoseptal hypokinesis, moderately decreased RV systolic function, small PFO.  - Echo (1/22): EF 60-65%, normal RV, mild-moderate MR.  13. CKD: Stage 3.  14. OSA: dental appliance.  15. Right radial artery pseudoaneurysm: post-cath in 3/20, s/p repair.  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 H/o esophageal stricture  Current Outpatient Medications  Medication Sig Dispense Refill   acetaminophen (TYLENOL) 500 MG tablet Take 250-500 mg by mouth every 8 (eight) hours as needed for mild pain or moderate pain.      albuterol (PROAIR HFA) 108 (90 BASE) MCG/ACT inhaler Inhale 2 puffs into the lungs every 4 (four) hours as needed for wheezing or shortness of breath (and prior to exercise). 1 Inhaler 3   dicyclomine (BENTYL) 10 MG capsule TAKE ONE CAPSULE BY MOUTH 3 TIMES DAILY AS  NEEDED FOR SPASMS 270 capsule 1   diltiazem (CARDIZEM CD) 240 MG 24 hr capsule Take 1 capsule (240 mg total) by mouth at bedtime. 90 capsule 3   dofetilide (TIKOSYN) 250 MCG capsule TAKE 1 CAPSULE BY MOUTH EVERY 12 HOURS 180 capsule 3   ELIQUIS 2.5 MG TABS tablet TAKE 1 TABLET BY MOUTH TWICE A DAY 180 tablet 3   empagliflozin (JARDIANCE) 10 MG TABS tablet TAKE 1 TABLET BY MOUTH EVERY DAY BEFORE BREAKFAST 90 tablet 3   ezetimibe (ZETIA) 10 MG tablet TAKE 1 TABLET BY MOUTH EVERY DAY 90 tablet 3   famotidine (PEPCID) 40 MG tablet TAKE 1 TABLET BY MOUTH EVERYDAY AT BEDTIME 90 tablet 3   furosemide (LASIX) 20 MG tablet Take 1 tablet (20 mg total) by mouth every other day. 30 tablet 3   lactobacillus acidophilus & bulgar (LACTINEX) chewable tablet Chew 2 tablets by mouth at bedtime.      loperamide (IMODIUM) 2 MG capsule Take 2 mg by mouth See admin instructions. Take 1 capsule (2 mg) by mouth in the morning (0800) & may take an additional dose in the evening if needed for diarrhea/loose stools.     LORazepam (ATIVAN) 0.5 MG tablet Take 0.5 mg by mouth at bedtime.     methocarbamol (ROBAXIN) 500 MG tablet Take 500 mg by mouth 3 (three) times daily as needed for muscle spasms.      nitroGLYCERIN (NITROSTAT) 0.4 MG SL tablet Place 1 tablet (0.4 mg total) under the tongue every 5 (five) minutes as needed for chest pain. 25 tablet 0   potassium chloride (KLOR-CON) 10 MEQ tablet Take 2 tablets (20 mEq total) by mouth daily. 180 tablet 3   Probiotic Product (ALIGN) 4 MG CAPS Take 4 mg by mouth daily. (0800)     rosuvastatin (CRESTOR) 40 MG tablet TAKE 1 TABLET BY MOUTH EVERY DAY 90 tablet 3   Testosterone POWD Apply 1 mL topically daily. Testosterone (AT) 150/0(150mg ) cpd     traZODone (DESYREL) 50 MG tablet Take 50 mg by mouth at bedtime.     Vitamin D, Ergocalciferol, (DRISDOL) 50000 UNITS CAPS capsule Take 50,000 Units by mouth every 14 (fourteen) days. Take on 1st and 15th at 8p.     No current  facility-administered medications for this encounter.    Allergies:   Morphine and related and Tape   Social History:  The patient  reports that  he quit smoking about 55 years ago. His smoking use included cigarettes. He has a 2.50 pack-year smoking history. He quit smokeless tobacco use about 73 years ago. He reports that he does not drink alcohol and does not use drugs.   Family History:  The patient's family history includes Cancer in his son; Heart Problems in his brother; Heart disease in his father, maternal grandfather, maternal grandmother, mother, paternal grandfather, and paternal grandmother; Prostate cancer in his paternal uncle.   ROS:  Please see the history of present illness.   All other systems are personally reviewed and negative.   Exam:   BP (!) 150/70    Pulse 69    Wt 49.7 kg (109 lb 9.6 oz)    SpO2 96%    BMI 17.69 kg/m  General: NAD, thin Neck: No JVD, no thyromegaly or thyroid nodule.  Lungs: Clear to auscultation bilaterally with normal respiratory effort. CV: Nondisplaced PMI.  Heart regular S1/S2, no S3/S4, no murmur.  No peripheral edema.  No carotid bruit.  Normal pedal pulses.  Abdomen: Soft, nontender, no hepatosplenomegaly, no distention.  Skin: Intact without lesions or rashes.  Neurologic: Alert and oriented x 3.  Psych: Normal affect. Extremities: No clubbing or cyanosis.  HEENT: Normal.   Recent Labs: 03/17/2021: ALT 29 10/28/2021: Hemoglobin 15.5; Platelets 156 01/28/2022: B Natriuretic Peptide 165.5; BUN 32; Creatinine, Ser 1.33; Magnesium 2.5; Potassium 5.2; Sodium 138  Personally reviewed   Wt Readings from Last 3 Encounters:  01/28/22 49.7 kg (109 lb 9.6 oz)  10/28/21 51.6 kg (113 lb 12.8 oz)  07/27/21 52.7 kg (116 lb 3.2 oz)   ASSESSMENT AND PLAN:  1. Atrial fibrillation/flutter: He has been on Tikosyn and is s/p atrial fibrillation ablation at [facility] on 06/25/14.  He has also had atypical atrial flutter. He had a redo atrial fibrillation  ablation in 6/20.  Complex flutter was noted from several foci and was not ablated.  Zio patch from 7/20 showed predominantly NSR with short runs SVT.  DCCV in 11/20, DCCV again in 7/21 and in 1/22. Redo atrial fibrillation ablation in 11/21.  He has a wandering atrial pacemaker today, was in junctional rhythm last appt.  He has significant sinus node disease/sick sinus syndrome and may eventually require pacemaker.  - Continue Tikosyn for now.  QTc today is acceptable (471 msec). Check BMET/Mg today.  - Continue apixaban, this is appropriately dosed at 2.5 mg bid with age and low weight.   - Can continue current diltiazem CD.  2. CAD: H/o CABG.  He had NSTEMI in 3/20 with DES to ostial/proximal LAD and SVG-RCA.  No chest pain.  - He will continue apixaban long-term for atrial fibrillation/flutter.  - He is on statin and Zetia. Good lipids in 12/22. 3. Hyperlipidemia: He is taking Crestor 40 mg daily and Zetia 10 mg daily.  Good lipids in 12/22.  4. Ischemic cardiomyopathy/primarily diastolic CHF: Echo in 4/49 with EF 45-50%, echo in 10/20 with EF 50-55%, echo in 1/22 with EF 60-65%.  NYHA class II symptoms.  Not volume overloaded.  - He takes Lasix prn.     - Continue Jardiance 10 mg daily. - He is off losartan with elevated creatinine.  5. Pulmonary: PFTs suggested both a restrictive and obstructive defect.  The restrictive defect is likely from post-polio syndrome and elevated left hemidiaphragm.  Mr Spickler never smoked much, so the obstructive defect is more surprising.  6. OSA: Severe, likely potentiates his atrial fibrillation. Unable to tolerate CPAP  or oral appliance.   7. CVA: 10/20, in setting of holding anticoagulation for 2 days.  Avoid holding anticoagulation in the future.  Mild residual effect on right hand dexterity.   Followup in 3 months.   Signed, [name], MD  01/28/2022   [place] [address] [facility] and [place]  [address] [phone] (office) [phone] (fax)

## 2022-01-28 NOTE — Patient Instructions (Signed)
Labs done today, your results will be available in MyChart, we will contact you for abnormal readings.  Your physician recommends that you schedule a follow-up appointment in: 3 months  If you have any questions or concerns before your next appointment please send us a message through mychart or call our office at [phone].    TO LEAVE A MESSAGE FOR THE NURSE SELECT OPTION 2, PLEASE LEAVE A MESSAGE INCLUDING: YOUR NAME DATE OF BIRTH CALL BACK NUMBER REASON FOR CALL**this is important as we prioritize the call backs  YOU WILL RECEIVE A CALL BACK THE SAME DAY AS LONG AS YOU CALL BEFORE 4:00 PM  At the [facility], you and your health needs are our priority. As part of our continuing mission to provide you with exceptional heart care, we have created designated Provider Care Teams. These Care Teams include your primary Cardiologist (physician) and Advanced Practice Providers (APPs- Physician Assistants and Nurse Practitioners) who all work together to provide you with the care you need, when you need it.   You may see any of the following providers on your designated Care Team at your next follow up: Dr Daniel Bensimhon Dr Dalton McLean Amy Clegg, NP Brittainy Simmons, PA Jessica Milford,NP Lindsay Finch, PA Lauren Kemp, PharmD   Please be sure to bring in all your medications bottles to every appointment.    

## 2022-01-29 ENCOUNTER — Encounter (HOSPITAL_COMMUNITY): Payer: Medicare Other | Admitting: Cardiology

## 2022-03-04 ENCOUNTER — Ambulatory Visit: Payer: Medicare Other | Admitting: Cardiology

## 2022-04-18 ENCOUNTER — Other Ambulatory Visit (HOSPITAL_COMMUNITY): Payer: Self-pay | Admitting: Cardiology

## 2022-04-29 ENCOUNTER — Ambulatory Visit (HOSPITAL_COMMUNITY)
Admission: RE | Admit: 2022-04-29 | Discharge: 2022-04-29 | Disposition: A | Discharge status: Home / Self Care | Payer: Medicare Other | Source: Ambulatory Visit | Attending: Cardiology | Admitting: Cardiology

## 2022-04-29 ENCOUNTER — Encounter (HOSPITAL_COMMUNITY): Payer: Self-pay | Admitting: Cardiology

## 2022-04-29 VITALS — BP 150/62 | HR 70 | Wt 118.8 lb

## 2022-04-29 DIAGNOSIS — I484 Atypical atrial flutter: Secondary | ICD-10-CM | POA: Insufficient documentation

## 2022-04-29 DIAGNOSIS — I5032 Chronic diastolic (congestive) heart failure: Secondary | ICD-10-CM | POA: Insufficient documentation

## 2022-04-29 DIAGNOSIS — Z681 Body mass index (BMI) 19 or less, adult: Secondary | ICD-10-CM | POA: Diagnosis not present

## 2022-04-29 DIAGNOSIS — Z8679 Personal history of other diseases of the circulatory system: Secondary | ICD-10-CM | POA: Diagnosis not present

## 2022-04-29 DIAGNOSIS — I2510 Atherosclerotic heart disease of native coronary artery without angina pectoris: Secondary | ICD-10-CM | POA: Insufficient documentation

## 2022-04-29 DIAGNOSIS — Z951 Presence of aortocoronary bypass graft: Secondary | ICD-10-CM | POA: Diagnosis not present

## 2022-04-29 DIAGNOSIS — I255 Ischemic cardiomyopathy: Secondary | ICD-10-CM | POA: Diagnosis not present

## 2022-04-29 DIAGNOSIS — I471 Supraventricular tachycardia: Secondary | ICD-10-CM | POA: Diagnosis not present

## 2022-04-29 DIAGNOSIS — G4733 Obstructive sleep apnea (adult) (pediatric): Secondary | ICD-10-CM | POA: Insufficient documentation

## 2022-04-29 DIAGNOSIS — Z79899 Other long term (current) drug therapy: Secondary | ICD-10-CM | POA: Insufficient documentation

## 2022-04-29 DIAGNOSIS — R9431 Abnormal electrocardiogram [ECG] [EKG]: Secondary | ICD-10-CM | POA: Insufficient documentation

## 2022-04-29 DIAGNOSIS — Z955 Presence of coronary angioplasty implant and graft: Secondary | ICD-10-CM | POA: Insufficient documentation

## 2022-04-29 DIAGNOSIS — R030 Elevated blood-pressure reading, without diagnosis of hypertension: Secondary | ICD-10-CM | POA: Insufficient documentation

## 2022-04-29 DIAGNOSIS — I4819 Other persistent atrial fibrillation: Secondary | ICD-10-CM | POA: Insufficient documentation

## 2022-04-29 DIAGNOSIS — Z7984 Long term (current) use of oral hypoglycemic drugs: Secondary | ICD-10-CM | POA: Insufficient documentation

## 2022-04-29 DIAGNOSIS — Z8673 Personal history of transient ischemic attack (TIA), and cerebral infarction without residual deficits: Secondary | ICD-10-CM | POA: Diagnosis not present

## 2022-04-29 DIAGNOSIS — G14 Postpolio syndrome: Secondary | ICD-10-CM | POA: Diagnosis not present

## 2022-04-29 DIAGNOSIS — R7989 Other specified abnormal findings of blood chemistry: Secondary | ICD-10-CM | POA: Insufficient documentation

## 2022-04-29 DIAGNOSIS — Z8249 Family history of ischemic heart disease and other diseases of the circulatory system: Secondary | ICD-10-CM | POA: Diagnosis not present

## 2022-04-29 DIAGNOSIS — I252 Old myocardial infarction: Secondary | ICD-10-CM | POA: Insufficient documentation

## 2022-04-29 DIAGNOSIS — I480 Paroxysmal atrial fibrillation: Secondary | ICD-10-CM

## 2022-04-29 DIAGNOSIS — Z7901 Long term (current) use of anticoagulants: Secondary | ICD-10-CM | POA: Diagnosis not present

## 2022-04-29 DIAGNOSIS — E785 Hyperlipidemia, unspecified: Secondary | ICD-10-CM | POA: Insufficient documentation

## 2022-04-29 DIAGNOSIS — I495 Sick sinus syndrome: Secondary | ICD-10-CM | POA: Insufficient documentation

## 2022-04-29 DIAGNOSIS — Z9889 Other specified postprocedural states: Secondary | ICD-10-CM | POA: Insufficient documentation

## 2022-04-29 LAB — BASIC METABOLIC PANEL
Anion gap: 7 (ref 5–15)
BUN: 15 mg/dL (ref 8–23)
CO2: 26 mmol/L (ref 22–32)
Calcium: 9.7 mg/dL (ref 8.9–10.3)
Chloride: 106 mmol/L (ref 98–111)
Creatinine, Ser: 1.32 mg/dL — ABNORMAL HIGH (ref 0.61–1.24)
GFR, Estimated: 54 mL/min — ABNORMAL LOW (ref >60)
Glucose, Bld: 89 mg/dL (ref 70–99)
Potassium: 3.8 mmol/L (ref 3.5–5.1)
Sodium: 139 mmol/L (ref 135–145)

## 2022-04-29 LAB — MAGNESIUM: Magnesium: 2.2 mg/dL (ref 1.7–2.4)

## 2022-04-29 MED ORDER — FUROSEMIDE 20 MG PO TABS: 20.0000 mg | ORAL_TABLET | ORAL | 3 refills | Status: DC | PRN

## 2022-04-29 NOTE — Progress Notes (Signed)
? ?  ? ? ?ID:  [name], DOB 04-17-1936, MRN [id number]  ?Provider location: [facility] ?Type of Visit: Established patient ? ?PCP:  Burnard Bunting, MD  ?Cardiologist:  [name] ?  ?History of Present Illness: ?[name] is a 86 y.o. male who has history of CAD s/p CABG.  Cath in 11/09 showed that the SVG-distal RCA was patent, the CFX system was patent.  His LIMA was atretic and there were serial 60% and 80% stenoses in the native LAD.  He was managed medically.  Echo in 8/14 showed EF 55-60% with moderate MR and moderate TR.  ?  ?He was initially noted to have atrial fibrillation in the summer of 2014. He was started on Xarelto and cardioverted to NSR in 8/14.  Recurrent atrial fibrillation was noted in 1/15, and he was cardioverted to NSR again.  This time, NSR did not hold long. By 5/15, he was in persistent atrial fibrillation.  I referred him to [facility] where he had atrial fibrillation ablation and Tikosyn loading.  Ranolazine was stopped due to risk of QT prolongation and Imdur was started as an anti-anginal instead.  Lexiscan Cardiolite in 11/15 showed no ischemia.   ?  ?CTA chest done in 2/16 to look for evidence for PV stenosis post-AF ablation.  This showed mild short-segment narrowing of the left inferior pulmonary vein.  ?  ?Patient was admitted in 5/18 with acute on chronic diastolic CHF.  He had been at the beach for [place] days and ate out a number of times, probably getting a significant sodium load.  No chest pain.  He was in normal sinus rhythm.  He was started on IV Lasix and diuresed.  Echo in 5/18 showed EF 65-70% with moderately dilated RV and normal systolic function, PASP 57 mmHg.  Lexiscan Cardiolite in 6/18 showed no significant perfusion defect.  ?  ?He was admitted with NSTEMI in 3/20, had DES to ostial LAD and mid SVG-RCA.  Echo showed EF 45-50%.  He was in atypical atrial flutter persistently despite Tikosyn.  After his intervention, he was noted to  have a radial artery pseudoaneurysm that was repaired by Dr. Trula Slade.  ? ?In 6/20, he had a redo atrial fibrillation ablation.  He was also noted to have complex flutter from multiple foci that was not ablated.  After the procedure, he has had considerable pain in the right leg at the cath site but also radiating down the leg, groin US showed no AV fistula or pseudoaneurysm.   ? ?In 10/20, patient held anticoagulation for 2 days for oral surgery and had a CVA presenting as right hand weakness.  He did not get tPA.  Echo in 10/20 showed EF 50-55%, mild LVH, moderately decreased RV function, small PFO. Carotid dopplers showed 1-39% bilateral stenosis. He was switched from Xarelto to Eliquis.  ? ?In 11/20, he went into persistent atrial fibrillation and underwent DCCV.  In 7/21, he went into atypical flutter and had DCCV to NSR.  He had a repeat atrial fibrillation ablation in 11/21.  ? ?Echo in 1/22 showed EF 60-65%, normal RV, mild-moderate MR. DCCV to NSR in 1/22.  ? ?Given significant weight loss, patient had CT chest/abdomen/pelvis in 7/22, this showed no worrisome abnormalities.  ? ?He returns for followup of CHF, atrial fibrillation, and CAD.  Generally breathing well, no dyspnea when he walks at a slow/steady pace.  Able to get around the grocery store.  Occasional lightheadedness with standing,  no falls. BP elevated today but SBP 100s-120s when he checks at home.  Weight is up, he says that he is eating more.  Using Lasix rarely.  He is not in atrial fibrillation.  ? ?ECG (personally reviewed): Small, low voltage P waves (?ectopic atrial rhythm),  QTc 483 msec ? ?Labs (8/12): K 4.1, creatinine 1.2, LDL 91, HDL 53 ?Labs (8/14): K 4.6, creatinine 1.1 ?Labs (11/14): K 4.6, creatinine 1.2, LDL 91, HDL 56 ?Labs (3/15): AST 38, ALT 32, TSH normal, BNP 237 ?Labs (7/15): LDL 96, HDL 48, K 4.2, creatinine 1.2, TSH normal, BNP 237, AST 38, ALT 32 ?Labs (8/15): LFTs normal ?Labs (11/15): K 4.2, creatinine 1.1, Mg 2.2,  BNP 227 ?Labs (2/16): K 4, creatinine 1.32, BNP 261 ?Labs (6/18): K 3.9, creatinine 1.6 ?Labs (7/18): K 3.9, creatinine 1.5, BNP 242 ?Labs (12/18): K 3.8, creatinine 1.4, hgb 13.9, LDL 73, HDL 52 ?Labs (3/19): K 3.7, creatinine 1.4, LDL 76, HDL 42 ?Labs (6/19): K 4.1, creatinine 1.63 ?Labs (3/20): K 4.2, creatinine 1.29, LDL 53, HDL 47, hgb 13.8 ?Labs (4/20): K 4.3, creatinine 1.5 ?Labs (6/20): K 4.2, creatinine 1.84, hgb 12.4 ?Labs (7/20): K 4.5, creatinine 2.53 => 1.72 ?Labs (8/20): K 4.7, creatinine 1.5, Mg 2.4 ?Labs (12/20): LDL 79, K 4.9, creatinine 1.4 ?Labs (2/21): K 4.5, creatinine 1.56 ?Labs (8/21): K 4, creatinine 1.4, LDL 69, HDL 52 ?Labs (12/21): K 4.4, creatinine 1.21, LDL 85 ?Labs (3/22): K 4.1, creatinine 1.6, hgb 13.3, BNP 197 ?Labs (7/22): creatinine 1.72 ?Labs (8/22): LDL 74, HDL 50, BNP 307, K 4.5, creatinine 1.47 ?Labs (12/22): LDL 74, HDL 49, K 4.1, creatinine 1.5 ?Labs (2/23): BNP 166, K 5.2, creatinine 1.33 ?  ?PMH: ?1. CAD: 1st MI in 74.  CABG 1996.  PCI to CFX in 2009.  LHC (11/09) SVG-dRCA patent, total occlusion RCA, patent CFX stent, atretic LIMA, serial 60 and 80% proximal LAD stenoses, EF 60% with basal inferior hypokinesis.  Myoview in 2011 with no ischemia or infarction.  Echo (10/12) with EF 55-60%, mild LVH, mild MR.  Lexiscan Cardiolite in 2013 with no ischemia or infarction.  Echo (8/14) with EF 55-60%, moderate MR, moderate TR, PA systolic pressure 35 mmHg.  Echo (4/15) with EF 60-65%, mild focal basal septal hypertrophy, inferior basal akinesis, mild MR.  Lexiscan cardiolite (11/15) with EF 61%, fixed basal inferoseptal defect, no ischemia.  ?- Lexiscan Cardiolite (6/18): EF 54%, no perfusion defect.  ?- NSTEMI 3/20, cath showed 95% ostial/proximal LAD, 60-70% mid LAD, totally occluded SVG-LCx, totally occluded RCA, 80-90% mid SVG-RCA.  Patient had DES to ostial-mid LAD and DES to SVG-RCA.  ?2. Raynauds syndrome ?3. Post-polio syndrome ?4. GERD with dilation of esophageal  stricture in 12/12.  ?5. Hyperlipidemia ?6. H/o CVAs: Brainstem stroke with Horner's syndrome.  Recurrent stroke in 10/20 with right hand weakness when off anticoagulation for 2 days.  ?- Carotid dopplers (10/20) with minimal stenosis.  ?7. Scoliosis. ?8. H/o appendectomy ?9. Herpes Zoster ?10. Atrial fibrillation/atypical atrial flutter: DCCV to NSR in 8/14. DCCV to NSR in 1/15. Atrial fibrillation ablation 6/15 with Tikosyn loading (at [facility]).   ?- Redo atrial fibrillation ablation in 6/20.  Patient also noted to have complex flutter with several foci, not ablated.  ?- Zio patch (7/20): Primarily NSR with a few short SVT runs.  ?- DCCV 11/20.  ?- DCCV 7/21 ?- Redo atrial fibrillation ablation 11/21.  ?- DCCV 1/22 ?11. PFTs (4/15) with FVC 59%, FEV1 54%, ratio 91%, DLCO 53% => moderate  obstructive defect thought to be related to COPD and severe restrictive defect thought to be due to elevated left hemidiaphragm and post-polio syndrome.  ?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  Echo (5/18) with EF 65-70%, RV moderately dilated with normal systolic function, PASP 57 mmHg.  ?- Echo (3/20): EF 45-50%, mild LV dilation, basal inferolateral and inferior hypokinesis.  ?- Echo (10/20): EF 50-55%, mild LVH, basal inferior and inferoseptal hypokinesis, moderately decreased RV systolic function, small PFO.  ?- Echo (1/22): EF 60-65%, normal RV, mild-moderate MR.  ?13. CKD: Stage 3.  ?14. OSA: dental appliance.  ?15. Right radial artery pseudoaneurysm: post-cath in 3/20, s/p repair.  ?16. Elevated left hemi-diaphragm ?17. H/o esophageal stricture ? ?Current Outpatient Medications  ?Medication Sig Dispense Refill  ? acetaminophen (TYLENOL) 500 MG tablet Take 250-500 mg by mouth every 8 (eight) hours as needed for mild pain or moderate pain.     ? albuterol (PROAIR HFA) 108 (90 BASE) MCG/ACT inhaler Inhale 2 puffs into the lungs every 4 (four) hours as needed for wheezing or shortness of breath (and prior to exercise). 1 Inhaler 3  ?  dicyclomine (BENTYL) 10 MG capsule TAKE ONE CAPSULE BY MOUTH 3 TIMES DAILY AS NEEDED FOR SPASMS 270 capsule 1  ? diltiazem (CARDIZEM CD) 240 MG 24 hr capsule Take 1 capsule (240 mg total) by mouth at bedtime. 90 cap

## 2022-04-29 NOTE — Patient Instructions (Signed)
Good to see you today! ? ?Take Lasix 20 mg x 2 days then as needed ? ?Labs done today, your results will be available in MyChart, we will contact you for abnormal readings. ? ?Your physician has requested that you have an echocardiogram. Echocardiography is a painless test that uses sound waves to create images of your heart. It provides your doctor with information about the size and shape of your heart and how well your heart?s chambers and valves are working. This procedure takes approximately one hour. There are no restrictions for this procedure. ? ? ?Your physician recommends that you schedule a follow-up appointment in: 3 months with echocardiogram ? ?If you have any questions or concerns before your next appointment please send [place] a message through [place] or call our office at [phone].   ? ?TO LEAVE A MESSAGE FOR THE NURSE SELECT OPTION 2, PLEASE LEAVE A MESSAGE INCLUDING: ?YOUR NAME ?DATE OF BIRTH ?CALL BACK NUMBER ?REASON FOR CALL**this is important as we prioritize the call backs ? ?YOU WILL RECEIVE A CALL BACK THE SAME DAY AS LONG AS YOU CALL BEFORE 4:00 PM ? ?At the [facility], you and your health needs are our priority. As part of our continuing mission to provide you with exceptional heart care, we have created designated Provider Care Teams. These Care Teams include your primary Cardiologist (physician) and Advanced Practice Providers (APPs- Physician Assistants and Nurse Practitioners) who all work together to provide you with the care you need, when you need it.  ? ?You may see any of the following providers on your designated Care Team at your next follow up: ?Dr Glori Bickers ?Dr [name] ?Darrick Grinder, NP ?Lyda Jester, PA ?Jessica Milford,NP ?Marlyce Huge, PA ?Audry Riles, PharmD ? ? ?Please be sure to bring in all your medications bottles to every appointment.  ? ? ?

## 2022-05-05 ENCOUNTER — Other Ambulatory Visit: Payer: Self-pay | Admitting: Nurse Practitioner

## 2022-05-07 ENCOUNTER — Telehealth (HOSPITAL_COMMUNITY): Payer: Self-pay | Admitting: *Deleted

## 2022-05-07 NOTE — Telephone Encounter (Signed)
[facility] at [phone]. Per pts request.  ?

## 2022-05-27 ENCOUNTER — Encounter: Payer: Self-pay | Admitting: Cardiology

## 2022-05-27 ENCOUNTER — Ambulatory Visit: Payer: Medicare Other | Admitting: Cardiology

## 2022-05-27 VITALS — BP 132/68 | HR 90 | Ht 66.0 in | Wt 113.8 lb

## 2022-05-27 DIAGNOSIS — I4819 Other persistent atrial fibrillation: Secondary | ICD-10-CM

## 2022-05-27 DIAGNOSIS — D6869 Other thrombophilia: Secondary | ICD-10-CM | POA: Diagnosis not present

## 2022-05-27 NOTE — Patient Instructions (Signed)
Medication Instructions:  Your physician recommends that you continue on your current medications as directed. Please refer to the Current Medication list given to you today.  *If you need a refill on your cardiac medications before your next appointment, please call your pharmacy*   Lab Work: None ordered  Testing/Procedures: None ordered   Follow-Up: At [facility], you and your health needs are our priority.  As part of our continuing mission to provide you with exceptional heart care, we have created designated Provider Care Teams.  These Care Teams include your primary Cardiologist (physician) and Advanced Practice Providers (APPs -  Physician Assistants and Nurse Practitioners) who all work together to provide you with the care you need, when you need it.   Your next appointment:   6 month(s)  The format for your next appointment:   In Person  Provider:   You will see one of the following Advanced Practice Providers on your designated Care Team:   Renee Ursuy, PA-C Michael "Andy" Tillery, PA-C    Thank you for choosing [facility]!!   Nakea Gouger, RN [phone]  Other Instructions   Important Information About Sugar           

## 2022-05-27 NOTE — Progress Notes (Signed)
Electrophysiology Office Note   Date:  05/27/2022   ID:  LAIRD RUNNION, DOB 01-27-1936, MRN [id number]  PCP:  Burnard Bunting, MD  Cardiologist:  Aundra Dubin Primary Electrophysiologist:  Whitfield Dulay Meredith Leeds, MD    No chief complaint on file.     History of Present Illness: [name] is a 86 y.o. male who is being seen today for the evaluation of atrial fibrillation/flutter at the request of Burnard Bunting, MD. Presenting today for electrophysiology evaluation.    He has a history significant for persistent atrial fibrillation, coronary artery disease, COPD, CVA, OSA, hypertension, hyperlipidemia.  He was diagnosed with atrial fibrillation several years ago and was started on dofetilide.  He is on BiPAP for his sleep apnea.  He also has atypical atrial flutter.  He is status post atrial fibrillation/flutter ablation 06/08/2019.  During that time he had multiple atrial flutter circuits.  He is status post repeat ablation 11/19/2020.  Today, denies symptoms of palpitations, chest pain, shortness of breath, orthopnea, PND, lower extremity edema, claudication, dizziness, presyncope, syncope, bleeding, or neurologic sequela. The patient is tolerating medications without difficulties.  Since being seen he has done well.  He continues to have intermittent episodes of tachypalpitations, but overall remains in sinus rhythm.  He is overall quite happy with how he has been feeling.   Past Medical History:  Diagnosis Date   Abnormal nuclear cardiac imaging test March 2011   Has positive EKG response, no perfusion defect and normal EF   Adenomatous colon polyp 12/2002   Arrhythmia    Atrial fibrillation ([place])    a. s/p rfca;  b. chronic tikosyn and xarelto.   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 04/2017 Echo: EF 65-70%, restrictive  physiology, mildly dil LA.   COPD (chronic obstructive pulmonary disease) (HCC)    Coronary artery disease    First MI in 1988. PCI in 1996 with subsequent CABG in 1996 due to restenosis. S/P stents to LCX in 2009. Noted to have residual disease in the LAD in a diffuse manner and atretic LIMA graft. He is managed medically.    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   History of Raynaud's syndrome    Hyperlipidemia    Hypertension    Internal hemorrhoids    Muscle spasm    Myocardial infarction (HCC)    MI x1 1989 - 84 age   Neuromuscular disorder ([place])    Polio - age 89   OSA (obstructive sleep apnea) 10/16/2018   Severe OSA with AHI 37.2/hr on BiPAP   Persistent atrial fibrillation ([place])    Polio    age 67   PVC (premature ventricular contraction)    Scoliosis    mild   Past Surgical History:  Procedure Laterality Date   ABLATION     done for a fib   APPENDECTOMY     Age 34   ATRIAL FIBRILLATION ABLATION N/A 06/08/2019   Procedure: ATRIAL FIBRILLATION ABLATION;  Surgeon: Constance Haw, MD;  Location: [facility];  Service: Cardiovascular;  Laterality: N/A;   ATRIAL  FIBRILLATION ABLATION N/A 11/19/2020   Procedure: ATRIAL FIBRILLATION ABLATION;  Surgeon: Constance Haw, MD;  Location: [place] [facility];  Service: Cardiovascular;  Laterality: N/A;   CARDIAC CATHETERIZATION     CARDIOVERSION N/A 08/22/2013   Procedure: CARDIOVERSION;  Surgeon: Carlena Bjornstad, MD;  Location: [facility];  Service: Cardiovascular;  Laterality: N/A;   CARDIOVERSION N/A 01/16/2014   Procedure: CARDIOVERSION;  Surgeon: Lelon Perla, MD;  Location: [facility] ENDOSCOPY;  Service: Cardiovascular;  Laterality: N/A;   CARDIOVERSION N/A 12/11/2018   Procedure: CARDIOVERSION;  Surgeon: Larey Dresser, MD;  Location: [facility] ENDOSCOPY;  Service: Cardiovascular;  Laterality: N/A;   CARDIOVERSION N/A 11/20/2019   Procedure: CARDIOVERSION;  Surgeon: [name], MD;  Location[facility] ENDOSCOPY;  Service: Endoscopy;  Laterality: N/A;   CARDIOVERSION N/A 07/10/2020   Procedure: CARDIOVERSION;  Surgeon: Larey Dresser, MD;  Location: [facility] ENDOSCOPY;  Service: Cardiovascular;  Laterality: N/A;   CARDIOVERSION N/A 01/21/2021   Procedure: CARDIOVERSION;  Surgeon: Larey Dresser, MD;  Location: [facility] ENDOSCOPY;  Service: Cardiovascular;  Laterality: N/A;   COLONOSCOPY  2008   CORONARY ARTERY BYPASS GRAFT  1996   with a LIMA to the LAD, SVG to RCA and SVG to OM   CORONARY STENT INTERVENTION N/A 03/26/2019   Procedure: CORONARY STENT INTERVENTION;  Surgeon: Jettie Booze, MD;  Location: [place] [facility];  Service: Cardiovascular;  Laterality: N/A;   CORONARY STENT PLACEMENT  1996   LCX   EMBOLECTOMY Right 05/09/2019   Procedure: Embolectomy of right radial artery;  Surgeon: Serafina Mitchell, MD;  Location: [place];  Service: Vascular;  Laterality: Right;   ESOPHAGEAL DILATION     EYE SURGERY     FALSE ANEURYSM REPAIR Right 05/09/2019   Procedure: REPAIR FALSE ANEURYSM RIGHT RADIAL;  Surgeon: Serafina Mitchell, MD;  Location: [place];  Service: Vascular;  Laterality: Right;   LEFT HEART CATH AND CORS/GRAFTS ANGIOGRAPHY N/A 03/26/2019   Procedure: LEFT HEART CATH AND CORS/GRAFTS ANGIOGRAPHY;  Surgeon: Larey Dresser, MD;  Location: [place] [facility];  Service: Cardiovascular;  Laterality: N/A;   POLYPECTOMY     TONSILLECTOMY     UPPER GASTROINTESTINAL ENDOSCOPY     esophegeal dilation     Current Outpatient Medications  Medication Sig Dispense Refill   acetaminophen (TYLENOL) 500 MG tablet Take 250-500 mg by mouth every 8 (eight) hours as needed for mild pain or moderate pain.      albuterol (PROAIR HFA) 108 (90 BASE) MCG/ACT inhaler Inhale 2 puffs into the lungs every 4 (four) hours  as needed for wheezing or shortness of breath (and prior to exercise). 1 Inhaler 3   Cholecalciferol (VITAMIN D3 PO) Take 2,000 Units by mouth daily.     dicyclomine (BENTYL) 10 MG capsule TAKE ONE CAPSULE BY MOUTH 3 TIMES DAILY AS NEEDED FOR SPASMS 270 capsule 1   diltiazem (CARDIZEM CD) 240 MG 24 hr capsule Take 1 capsule (240 mg total) by mouth at bedtime. 90 capsule 3   dofetilide (TIKOSYN) 250 MCG capsule TAKE 1 CAPSULE BY MOUTH EVERY 12 HOURS 180 capsule 3   ELIQUIS 2.5 MG TABS tablet TAKE 1 TABLET BY MOUTH TWICE A DAY 180 tablet 3   empagliflozin (JARDIANCE) 10 MG TABS tablet TAKE 1 TABLET BY MOUTH EVERY DAY BEFORE BREAKFAST 90 tablet 3   ezetimibe (ZETIA) 10 MG tablet TAKE 1 TABLET BY MOUTH EVERY DAY 90 tablet 3   famotidine (PEPCID) 40 MG tablet TAKE 1  TABLET BY MOUTH EVERYDAY AT BEDTIME 90 tablet 1   furosemide (LASIX) 20 MG tablet Take 1 tablet (20 mg total) by mouth as needed. 30 tablet 3   lactobacillus acidophilus & bulgar (LACTINEX) chewable tablet Chew 2 tablets by mouth at bedtime.      loperamide (IMODIUM) 2 MG capsule Take 2 mg by mouth See admin instructions. Take 1 capsule (2 mg) by mouth in the morning (0800) & may take an additional dose in the evening if needed for diarrhea/loose stools.     LORazepam (ATIVAN) 0.5 MG tablet Take 0.5 mg by mouth at bedtime.     methocarbamol (ROBAXIN) 500 MG tablet Take 500 mg by mouth 3 (three) times daily as needed for muscle spasms.      nitroGLYCERIN (NITROSTAT) 0.4 MG SL tablet Place 1 tablet (0.4 mg total) under the tongue every 5 (five) minutes as needed for chest pain. 25 tablet 0   Probiotic Product (ALIGN) 4 MG CAPS Take 4 mg by mouth daily. (0800)     rosuvastatin (CRESTOR) 40 MG tablet TAKE 1 TABLET BY MOUTH EVERY DAY 90 tablet 3   Testosterone POWD Apply 1 mL topically daily. Testosterone (AT) 150/0('150mg'$ ) cpd     traZODone (DESYREL) 50 MG tablet Take 50 mg by mouth at bedtime.     tadalafil (CIALIS) 5 MG tablet Take 5 mg by  mouth daily in the afternoon. X 6 DAYS INCREASE 10 MG DAILY THERE AFTER (Patient not taking: Reported on 05/27/2022)     No current facility-administered medications for this visit.    Allergies:   Morphine and related and Tape   Social History:  The patient  reports that he quit smoking about 55 years ago. His smoking use included cigarettes. He has a 2.50 pack-year smoking history. He quit smokeless tobacco use about 73 years ago. He reports that he does not drink alcohol and does not use drugs.   Family History:  The patient's family history includes Cancer in his son; Heart Problems in his brother; Heart disease in his father, maternal grandfather, maternal grandmother, mother, paternal grandfather, and paternal grandmother; Prostate cancer in his paternal uncle.   ROS:  Please see the history of present illness.   Otherwise, review of systems is positive for none.   All other systems are reviewed and negative.   PHYSICAL EXAM: VS:  BP 132/68   Pulse 90   Ht '5\' 6"'$  (1.676 m)   Wt 113 lb 12.8 oz (51.6 kg)   SpO2 97%   BMI 18.37 kg/m  , BMI Body mass index is 18.37 kg/m. GEN: Well nourished, well developed, in no acute distress  HEENT: normal  Neck: no JVD, carotid bruits, or masses Cardiac: RRR; no murmurs, rubs, or gallops,no edema  Respiratory:  clear to auscultation bilaterally, normal work of breathing GI: soft, nontender, nondistended, + BS MS: no deformity or atrophy  Skin: warm and dry Neuro:  Strength and sensation are intact Psych: euthymic mood, full affect  EKG:  EKG is not ordered today. Personal review of the ekg ordered 04/29/22 shows junctional rhythm, rate 67  Recent Labs: 10/28/2021: Hemoglobin 15.5; Platelets 156 01/28/2022: B Natriuretic Peptide 165.5 04/29/2022: BUN 15; Creatinine, Ser 1.32; Magnesium 2.2; Potassium 3.8; Sodium 139    Lipid Panel     Component Value Date/Time   CHOL 131 07/27/2021 1119   TRIG 35 07/27/2021 1119   HDL 50 07/27/2021 1119    CHOLHDL 2.6 07/27/2021 1119   VLDL 7 07/27/2021 1119  [place] 74 07/27/2021 1119     Wt Readings from Last 3 Encounters:  05/27/22 113 lb 12.8 oz (51.6 kg)  04/29/22 118 lb 12.8 oz (53.9 kg)  01/28/22 109 lb 9.6 oz (49.7 kg)      Other studies Reviewed: Additional studies/ records that were reviewed today include: TTE 12/29/18  Review of the above records today demonstrates:  - Left ventricle: The cavity size was normal. Systolic function was   normal. The estimated ejection fraction was in the range of 55%   to 60%. Hypokinesis of the basalanteroseptal and inferoseptal   myocardium. The study is not technically sufficient to allow   evaluation of LV diastolic function. - Aortic valve: Valve mobility was restricted. Transvalvular   velocity was within the normal range. There was no stenosis.   There was no regurgitation. Mean gradient (S): 8 mm Hg. Valve   area (VTI): 1.28 cm^2. Valve area (Vmax): 1.28 cm^2. Valve area   (Vmean): 1.26 cm^2. - Mitral valve: Transvalvular velocity was within the normal range.   There was no evidence for stenosis. There was mild regurgitation. - Left atrium: The atrium was moderately dilated. - Right ventricle: The cavity size was normal. Wall thickness was   normal. Systolic function was normal. - Tricuspid valve: There was mild regurgitation. - Pulmonary arteries: Systolic pressure was within the normal   range. PA peak pressure: 35 mm Hg (S).   ASSESSMENT AND PLAN:  1.  Persistent atrial fibrillation/atypical atrial flutter: Currently on dofetilide 250 mcg twice daily, Eliquis 2.5 mg twice daily.  He had multiple atrial flutter circuits at the time of ablation.  Post ablation 05/29/2019.  High risk medication monitoring for dofetilide.  Remains in sinus rhythm today.  We Mirela Parsley continue with current management.  2.  Obstructive sleep apnea: CPAP compliance encouraged.  3.  Coronary artery disease: Status post CABG.  No current chest pain.  4.   Hypertension: Currently well controlled  5.  Secondary hypercoagulable state: Currently on Eliquis for atrial fibrillation as above.  Current medicines are reviewed at length with the patient today.   The patient does not have concerns regarding his medicines.  The following changes were made today: None  Labs/ tests ordered today include:  No orders of the defined types were placed in this encounter.     Disposition:   FU 6 months  Signed, Shakena Callari Meredith Leeds, MD  05/27/2022 10:35 AM     [facility] [address] [place] [address] [phone] (office) [phone] (fax)

## 2022-06-02 ENCOUNTER — Other Ambulatory Visit: Payer: Self-pay | Admitting: Cardiology

## 2022-06-02 ENCOUNTER — Other Ambulatory Visit: Payer: Self-pay | Admitting: Gastroenterology

## 2022-06-02 NOTE — Telephone Encounter (Signed)
error 

## 2022-07-03 ENCOUNTER — Other Ambulatory Visit (HOSPITAL_COMMUNITY): Payer: Self-pay | Admitting: Cardiology

## 2022-08-03 ENCOUNTER — Encounter (HOSPITAL_COMMUNITY): Payer: Self-pay | Admitting: Cardiology

## 2022-08-03 ENCOUNTER — Ambulatory Visit (HOSPITAL_COMMUNITY)
Admission: RE | Admit: 2022-08-03 | Discharge: 2022-08-03 | Disposition: A | Discharge status: Home / Self Care | Payer: Medicare Other | Source: Ambulatory Visit | Attending: Internal Medicine | Admitting: Internal Medicine

## 2022-08-03 ENCOUNTER — Ambulatory Visit (HOSPITAL_BASED_OUTPATIENT_CLINIC_OR_DEPARTMENT_OTHER)
Admission: RE | Admit: 2022-08-03 | Discharge: 2022-08-03 | Disposition: A | Discharge status: Home / Self Care | Payer: Medicare Other | Source: Ambulatory Visit | Attending: Cardiology | Admitting: Cardiology

## 2022-08-03 VITALS — BP 120/60 | HR 82 | Wt 113.6 lb

## 2022-08-03 DIAGNOSIS — Z955 Presence of coronary angioplasty implant and graft: Secondary | ICD-10-CM | POA: Diagnosis not present

## 2022-08-03 DIAGNOSIS — I5033 Acute on chronic diastolic (congestive) heart failure: Secondary | ICD-10-CM | POA: Insufficient documentation

## 2022-08-03 DIAGNOSIS — I2510 Atherosclerotic heart disease of native coronary artery without angina pectoris: Secondary | ICD-10-CM | POA: Insufficient documentation

## 2022-08-03 DIAGNOSIS — Z79899 Other long term (current) drug therapy: Secondary | ICD-10-CM | POA: Insufficient documentation

## 2022-08-03 DIAGNOSIS — R7989 Other specified abnormal findings of blood chemistry: Secondary | ICD-10-CM | POA: Diagnosis not present

## 2022-08-03 DIAGNOSIS — I255 Ischemic cardiomyopathy: Secondary | ICD-10-CM | POA: Insufficient documentation

## 2022-08-03 DIAGNOSIS — Z9049 Acquired absence of other specified parts of digestive tract: Secondary | ICD-10-CM | POA: Insufficient documentation

## 2022-08-03 DIAGNOSIS — I252 Old myocardial infarction: Secondary | ICD-10-CM | POA: Diagnosis not present

## 2022-08-03 DIAGNOSIS — I5032 Chronic diastolic (congestive) heart failure: Secondary | ICD-10-CM | POA: Diagnosis not present

## 2022-08-03 DIAGNOSIS — I484 Atypical atrial flutter: Secondary | ICD-10-CM | POA: Insufficient documentation

## 2022-08-03 DIAGNOSIS — E7849 Other hyperlipidemia: Secondary | ICD-10-CM

## 2022-08-03 DIAGNOSIS — I495 Sick sinus syndrome: Secondary | ICD-10-CM | POA: Diagnosis not present

## 2022-08-03 DIAGNOSIS — E785 Hyperlipidemia, unspecified: Secondary | ICD-10-CM | POA: Insufficient documentation

## 2022-08-03 DIAGNOSIS — G4733 Obstructive sleep apnea (adult) (pediatric): Secondary | ICD-10-CM | POA: Insufficient documentation

## 2022-08-03 DIAGNOSIS — Z951 Presence of aortocoronary bypass graft: Secondary | ICD-10-CM | POA: Diagnosis not present

## 2022-08-03 DIAGNOSIS — Z8673 Personal history of transient ischemic attack (TIA), and cerebral infarction without residual deficits: Secondary | ICD-10-CM | POA: Insufficient documentation

## 2022-08-03 DIAGNOSIS — Z7984 Long term (current) use of oral hypoglycemic drugs: Secondary | ICD-10-CM | POA: Insufficient documentation

## 2022-08-03 DIAGNOSIS — Z7901 Long term (current) use of anticoagulants: Secondary | ICD-10-CM | POA: Insufficient documentation

## 2022-08-03 DIAGNOSIS — I4819 Other persistent atrial fibrillation: Secondary | ICD-10-CM | POA: Insufficient documentation

## 2022-08-03 LAB — BASIC METABOLIC PANEL
Anion gap: 8 (ref 5–15)
BUN: 13 mg/dL (ref 8–23)
CO2: 27 mmol/L (ref 22–32)
Calcium: 10.3 mg/dL (ref 8.9–10.3)
Chloride: 103 mmol/L (ref 98–111)
Creatinine, Ser: 1.34 mg/dL — ABNORMAL HIGH (ref 0.61–1.24)
GFR, Estimated: 52 mL/min — ABNORMAL LOW (ref >60)
Glucose, Bld: 84 mg/dL (ref 70–99)
Potassium: 4.1 mmol/L (ref 3.5–5.1)
Sodium: 138 mmol/L (ref 135–145)

## 2022-08-03 LAB — ECHOCARDIOGRAM COMPLETE
AR max vel: 1.35 cm2
AV Area VTI: 1.29 cm2
AV Area mean vel: 1.33 cm2
AV Mean grad: 5 mmHg
AV Peak grad: 9.2 mmHg
Ao pk vel: 1.52 m/s
S' Lateral: 3.2 cm

## 2022-08-03 LAB — LIPID PANEL
Cholesterol: 131 mg/dL (ref 0–200)
HDL: 49 mg/dL (ref >40)
LDL Cholesterol: 71 mg/dL (ref 0–99)
Total CHOL/HDL Ratio: 2.7 RATIO
Triglycerides: 54 mg/dL (ref <150)
VLDL: 11 mg/dL (ref 0–40)

## 2022-08-03 LAB — MAGNESIUM: Magnesium: 2.5 mg/dL — ABNORMAL HIGH (ref 1.7–2.4)

## 2022-08-03 NOTE — Progress Notes (Signed)
  Echocardiogram 2D Echocardiogram has been performed.  Merrie Roof F 08/03/2022, 11:17 AM

## 2022-08-03 NOTE — Patient Instructions (Signed)
There has been no changes to your medications.  Labs done today, your results will be available in MyChart, we will contact you for abnormal readings.  Your physician recommends that you schedule a follow-up appointment in: 4 months ( December 2023 )  ** please call the office in October to arrange your follow up appointment **  Please cancel all previous orders for current medication. Change in dosage or pill size.   At the [facility], you and your health needs are our priority. As part of our continuing mission to provide you with exceptional heart care, we have created designated Provider Care Teams. These Care Teams include your primary Cardiologist (physician) and Advanced Practice Providers (APPs- Physician Assistants and Nurse Practitioners) who all work together to provide you with the care you need, when you need it.   You may see any of the following providers on your designated Care Team at your next follow up: Dr Glori Bickers Dr Haynes Kerns, NP Lyda Jester, [place] [facility] [place], [place] Audry Riles, PharmD   Please be sure to bring in all your medications bottles to every appointment.

## 2022-08-03 NOTE — Progress Notes (Signed)
ID:  [name], DOB 27-Jan-1936, MRN [id number]  Provider location: [place] [facility] Type of Visit: Established patient  PCP:  Burnard Bunting, MD  Cardiologist:  [name]   History of Present Illness: [name] is a 86 y.o. male who has history of CAD s/p CABG.  Cath in 11/09 showed that the SVG-distal RCA was patent, the CFX system was patent.  His LIMA was atretic and there were serial 60% and 80% stenoses in the native LAD.  He was managed medically.  Echo in 8/14 showed EF 55-60% with moderate MR and moderate TR.    He was initially noted to have atrial fibrillation in the summer of 2014. He was started on Xarelto and cardioverted to NSR in 8/14.  Recurrent atrial fibrillation was noted in 1/15, and he was cardioverted to NSR again.  This time, NSR did not hold long. By 5/15, he was in persistent atrial fibrillation.  I referred him to [facility] where he had atrial fibrillation ablation and Tikosyn loading.  Ranolazine was stopped due to risk of QT prolongation and Imdur was started as an anti-anginal instead.  Lexiscan Cardiolite in 11/15 showed no ischemia.     CTA chest done in 2/16 to look for evidence for PV stenosis post-AF ablation.  This showed mild short-segment narrowing of the left inferior pulmonary vein.    Patient was admitted in 5/18 with acute on chronic diastolic CHF.  He had been [place] the beach for [place] days and ate out a number of times, probably getting a significant sodium load.  No chest pain.  He was in normal sinus rhythm.  He was started on IV Lasix and diuresed.  Echo in 5/18 showed EF 65-70% with moderately dilated RV and normal systolic function, PASP 57 mmHg.  Lexiscan Cardiolite in 6/18 showed no significant perfusion defect.    He was admitted with NSTEMI in 3/20, had DES to ostial LAD and mid SVG-RCA.  Echo showed EF 45-50%.  He was in atypical atrial flutter persistently despite Tikosyn.  After his intervention, he was noted to  have a radial artery pseudoaneurysm that was repaired by Dr. Trula Slade.   In 6/20, he had a redo atrial fibrillation ablation.  He was also noted to have complex flutter from multiple foci that was not ablated.  After the procedure, he has had considerable pain in the right leg at the cath site but also radiating down the leg, groin US showed no AV fistula or pseudoaneurysm.    In 10/20, patient held anticoagulation for 2 days for oral surgery and had a CVA presenting as right hand weakness.  He did not get tPA.  Echo in 10/20 showed EF 50-55%, mild LVH, moderately decreased RV function, small PFO. Carotid dopplers showed 1-39% bilateral stenosis. He was switched from Xarelto to Eliquis.   In 11/20, he went into persistent atrial fibrillation and underwent DCCV.  In 7/21, he went into atypical flutter and had DCCV to NSR.  He had a repeat atrial fibrillation ablation in 11/21.   Echo in 1/22 showed EF 60-65%, normal RV, mild-moderate MR. DCCV to NSR in 1/22.   Given significant weight loss, patient had CT chest/abdomen/pelvis in 7/22, this showed no worrisome abnormalities.   Echo was done today and reviewed, EF 62-22%, grade 2 diastolic dysfunction, mild RV dilation and mild RV dysfunction, moderate LAE, mild-moderate MR, PASP 41 mmHg.   He returns for followup of CHF, atrial fibrillation,  and CAD.  Generally doing well.  No chest pain.  No lightheadedness.  Takes a Lasix dose every 2 wks or so.  Weight down 5 lbs.  No dyspnea walking on flat ground.  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    ECG (personally reviewed): Short PR, ?2:1 slow flutter with QTc 481 msec  Labs (8/12): K 4.1, creatinine 1.2, LDL 91, HDL 53 Labs (8/14): K 4.6, creatinine 1.1 Labs (11/14): K 4.6, creatinine 1.2, LDL 91, HDL 56 Labs (3/15): AST 38, ALT 32, TSH normal, BNP 237 Labs (7/15): LDL 96, HDL 48, K 4.2, creatinine 1.2, TSH normal, BNP 237, AST 38, ALT 32 Labs (8/15): LFTs normal Labs (11/15): K 4.2,  creatinine 1.1, Mg 2.2, BNP 227 Labs (2/16): K 4, creatinine 1.32, BNP 261 Labs (6/18): K 3.9, creatinine 1.6 Labs (7/18): K 3.9, creatinine 1.5, BNP 242 Labs (12/18): K 3.8, creatinine 1.4, hgb 13.9, LDL 73, HDL 52 Labs (3/19): K 3.7, creatinine 1.4, LDL 76, HDL 42 Labs (6/19): K 4.1, creatinine 1.63 Labs (3/20): K 4.2, creatinine 1.29, LDL 53, HDL 47, hgb 13.8 Labs (4/20): K 4.3, creatinine 1.5 Labs (6/20): K 4.2, creatinine 1.84, hgb 12.4 Labs (7/20): K 4.5, creatinine 2.53 => 1.72 Labs (8/20): K 4.7, creatinine 1.5, Mg 2.4 Labs (12/20): LDL 79, K 4.9, creatinine 1.4 Labs (2/21): K 4.5, creatinine 1.56 Labs (8/21): K 4, creatinine 1.4, LDL 69, HDL 52 Labs (12/21): K 4.4, creatinine 1.21, LDL 85 Labs (3/22): K 4.1, creatinine 1.6, hgb 13.3, BNP 197 Labs (7/22): creatinine 1.72 Labs (8/22): LDL 74, HDL 50, BNP 307, K 4.5, creatinine 1.47 Labs (12/22): LDL 74, HDL 49, K 4.1, creatinine 1.5 Labs (2/23): BNP 166, K 5.2, creatinine 1.33 Labs (5/23): K 3.8, creatinine 1.32   PMH: 1. CAD: 1st MI in 1988 (while [place] 11,000 feet on hiking trek at [employer]).  CABG 1996.  PCI to CFX in 2009.  LHC (11/09) SVG-dRCA patent, total occlusion RCA, patent CFX stent, atretic LIMA, serial 60 and 80% proximal LAD stenoses, EF 60% with basal inferior hypokinesis.  Myoview in 2011 with no ischemia or infarction.  Echo (10/12) with EF 55-60%, mild LVH, mild MR.  Lexiscan Cardiolite in 2013 with no ischemia or infarction.  Echo (8/14) with EF 55-60%, moderate MR, moderate TR, PA systolic pressure 35 mmHg.  Echo (4/15) with EF 60-65%, mild focal basal septal hypertrophy, inferior basal akinesis, mild MR.  Lexiscan cardiolite (11/15) with EF 61%, fixed basal inferoseptal defect, no ischemia.  - Lexiscan Cardiolite (6/18): EF 54%, no perfusion defect.  - NSTEMI 3/20, cath showed 95% ostial/proximal LAD, 60-70% mid LAD, totally occluded SVG-LCx, totally occluded RCA, 80-90% mid SVG-RCA.  Patient had DES to  ostial-mid LAD and DES to SVG-RCA.  2. Raynauds syndrome 3. Post-polio syndrome 4. GERD with dilation of esophageal stricture in 12/12.  5. Hyperlipidemia 6. H/o CVAs: Brainstem stroke with Horner's syndrome.  Recurrent stroke in 10/20 with right hand weakness when off anticoagulation for 2 days.  - Carotid dopplers (10/20) with minimal stenosis.  7. Scoliosis. 8. H/o appendectomy 9. Herpes Zoster 10. Atrial fibrillation/atypical atrial flutter: DCCV to NSR in 8/14. DCCV to NSR in 1/15. Atrial fibrillation ablation 6/15 with Tikosyn loading (at [facility]).   - Redo atrial fibrillation ablation in 6/20.  Patient also noted to have complex flutter with several foci, not ablated.  - Zio patch (7/20): Primarily NSR with a few short SVT runs.  - DCCV 11/20.  - DCCV 7/21 -  Redo atrial fibrillation ablation 11/21.  - DCCV 1/22 11. PFTs (4/15) with FVC 59%, FEV1 54%, ratio 91%, DLCO 53% => moderate obstructive defect thought to be related to COPD and severe restrictive defect thought to be due to elevated left hemidiaphragm and post-polio syndrome.  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  Echo (5/18) with EF 65-70%, RV moderately dilated with normal systolic function, PASP 57 mmHg.  - Echo (3/20): EF 45-50%, mild LV dilation, basal inferolateral and inferior hypokinesis.  - Echo (10/20): EF 50-55%, mild LVH, basal inferior and inferoseptal hypokinesis, moderately decreased RV systolic function, small PFO.  - Echo (1/22): EF 60-65%, normal RV, mild-moderate MR.  - Echo (8/23): EF 13-24%, grade 2 diastolic dysfunction, mild RV dilation and mild RV dysfunction, moderate LAE, mild-moderate MR, PASP 41 mmHg.  13. CKD: Stage 3.  14. OSA: dental appliance.  15. Right radial artery pseudoaneurysm: post-cath in 3/20, s/p repair.  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 H/o esophageal stricture  Current Outpatient Medications  Medication Sig Dispense Refill   acetaminophen (TYLENOL) 500 MG tablet Take 250-500 mg by mouth  every 8 (eight) hours as needed for mild pain or moderate pain.      albuterol (PROAIR HFA) 108 (90 BASE) MCG/ACT inhaler Inhale 2 puffs into the lungs every 4 (four) hours as needed for wheezing or shortness of breath (and prior to exercise). 1 Inhaler 3   Cholecalciferol (VITAMIN D3 PO) Take 2,000 Units by mouth daily.     dicyclomine (BENTYL) 10 MG capsule TAKE ONE CAPSULE BY MOUTH 3 TIMES DAILY AS NEEDED FOR SPASMS 270 capsule 0   diltiazem (CARDIZEM CD) 240 MG 24 hr capsule Take 1 capsule (240 mg total) by mouth at bedtime. 90 capsule 3   dofetilide (TIKOSYN) 250 MCG capsule TAKE 1 CAPSULE BY MOUTH EVERY 12 HOURS 180 capsule 3   ELIQUIS 2.5 MG TABS tablet TAKE 1 TABLET BY MOUTH TWICE A DAY 180 tablet 3   empagliflozin (JARDIANCE) 10 MG TABS tablet TAKE 1 TABLET BY MOUTH EVERY DAY BEFORE BREAKFAST 90 tablet 3   ezetimibe (ZETIA) 10 MG tablet TAKE 1 TABLET BY MOUTH EVERY DAY 90 tablet 3   famotidine (PEPCID) 40 MG tablet TAKE 1 TABLET BY MOUTH EVERYDAY AT BEDTIME 90 tablet 1   furosemide (LASIX) 20 MG tablet Take 1 tablet (20 mg total) by mouth as needed. 30 tablet 3   lactobacillus acidophilus & bulgar (LACTINEX) chewable tablet Chew 2 tablets by mouth at bedtime.      loperamide (IMODIUM) 2 MG capsule Take 2 mg by mouth See admin instructions. Take 1 capsule (2 mg) by mouth in the morning (0800) & may take an additional dose in the evening if needed for diarrhea/loose stools.     LORazepam (ATIVAN) 0.5 MG tablet Take 0.5 mg by mouth at bedtime.     methocarbamol (ROBAXIN) 500 MG tablet Take 500 mg by mouth 3 (three) times daily as needed for muscle spasms.      nitroGLYCERIN (NITROSTAT) 0.4 MG SL tablet Place 1 tablet (0.4 mg total) under the tongue every 5 (five) minutes as needed for chest pain. 25 tablet 0   Probiotic Product (ALIGN) 4 MG CAPS Take 4 mg by mouth daily. (0800)     rosuvastatin (CRESTOR) 40 MG tablet TAKE 1 TABLET BY MOUTH EVERY DAY 90 tablet 3   TADALAFIL PO Take 10 mg by  mouth daily in the afternoon.     Testosterone POWD Apply 1 mL topically daily. Testosterone (AT) 150/0('150mg'$ ) cpd  traZODone (DESYREL) 50 MG tablet Take 50 mg by mouth at bedtime.     Vitamin D, Ergocalciferol, (DRISDOL) 1.25 MG (50000 UNIT) CAPS capsule Take 50,000 Units by mouth every 14 (fourteen) days.     No current facility-administered medications for this encounter.    Allergies:   Morphine and related and Tape   Social History:  The patient  reports that he quit smoking about 55 years ago. His smoking use included cigarettes. He has a 2.50 pack-year smoking history. He quit smokeless tobacco use about 73 years ago. He reports that he does not drink alcohol and does not use drugs.   Family History:  The patient's family history includes Cancer in his son; Heart Problems in his brother; Heart disease in his father, maternal grandfather, maternal grandmother, mother, paternal grandfather, and paternal grandmother; Prostate cancer in his paternal uncle.   ROS:  Please see the history of present illness.   All other systems are personally reviewed and negative.   Exam:   BP 120/60   Pulse 82   Wt 51.5 kg (113 lb 9.6 oz)   SpO2 97%   BMI 18.34 kg/m  General: NAD Neck: No JVD, no thyromegaly or thyroid nodule.  Lungs: Decreased BS left base CV: Nondisplaced PMI.  Heart regular S1/S2, no S3/S4, no murmur.  No peripheral edema.  No carotid bruit.  Normal pedal pulses.  Abdomen: Soft, nontender, no hepatosplenomegaly, no distention.  Skin: Intact without lesions or rashes.  Neurologic: Alert and oriented x 3.  Psych: Normal affect. Extremities: No clubbing or cyanosis.  HEENT: Normal.   Recent Labs: 10/28/2021: Hemoglobin 15.5; Platelets 156 01/28/2022: B Natriuretic Peptide 165.5 08/03/2022: BUN 13; Creatinine, Ser 1.34; Magnesium 2.5; Potassium 4.1; Sodium 138  Personally reviewed   Wt Readings from Last 3 Encounters:  08/03/22 51.5 kg (113 lb 9.6 oz)  05/27/22 51.6 kg (113  lb 12.8 oz)  04/29/22 53.9 kg (118 lb 12.8 oz)   ASSESSMENT AND PLAN:  1. Atrial fibrillation/flutter: He has been on Tikosyn and is s/p atrial fibrillation ablation at [facility] on 06/25/14.  He has also had atypical atrial flutter. He had a redo atrial fibrillation ablation in 6/20.  Complex flutter was noted from several foci and was not ablated.  Zio patch from 7/20 showed predominantly NSR with short runs SVT.  DCCV in 11/20, DCCV again in 7/21 and in 1/22. Redo atrial fibrillation ablation in 11/21.  He has significant sinus node disease/sick sinus syndrome and may eventually require pacemaker.  I think that he is likely in slow 2:1 atrial flutter today.  - Continue Tikosyn for now.  QTc today is acceptable (481 msec). Check BMET/Mg today.  - Continue apixaban, this is appropriately dosed at 2.5 mg bid with age and low weight.   - Can continue current diltiazem CD.  2. CAD: H/o CABG.  He had NSTEMI in 3/20 with DES to ostial/proximal LAD and SVG-RCA.  No chest pain.  - He will continue apixaban long-term for atrial fibrillation/flutter.  - He is on statin and Zetia. Check lipids today.  3. Hyperlipidemia: He is taking Crestor 40 mg daily and Zetia 10 mg daily.  Check lipids today.   4. Ischemic cardiomyopathy/primarily diastolic CHF: Echo in 1/60 with EF 45-50%, echo in 10/20 with EF 50-55%, echo in 1/22 with EF 60-65%.  Echo today showed EF 73-71%, grade 2 diastolic dysfunction, mild RV dilation and mild RV dysfunction, moderate LAE, mild-moderate MR, PASP 41 mmHg.  NYHA class II symptoms.  Not volume overloaded.  - He takes Lasix prn.     - Continue Jardiance 10 mg daily. - He is off losartan with elevated creatinine.  5. Pulmonary: PFTs suggested both a restrictive and obstructive defect.  The restrictive defect is likely from post-polio syndrome and elevated left hemidiaphragm.  Mr Davids never smoked much, so the obstructive defect is more surprising.  6. OSA: Severe, likely potentiates his  atrial fibrillation. Unable to tolerate CPAP or oral appliance.   7. CVA: 10/20, in setting of holding anticoagulation for 2 days.  Avoid holding anticoagulation in the future.  Mild residual effect on right hand dexterity.   Followup in 4 months.   Signed, [name], MD  08/03/2022   [facility] [place] [address] [facility] and [place] [address] [phone] (office) [phone] (fax)

## 2022-08-04 LAB — TESTOSTERONE: Testosterone: 370 ng/dL (ref 264–916)

## 2022-08-25 ENCOUNTER — Other Ambulatory Visit: Payer: Self-pay | Admitting: Gastroenterology

## 2022-08-29 ENCOUNTER — Other Ambulatory Visit (HOSPITAL_COMMUNITY): Payer: Self-pay | Admitting: Cardiology

## 2022-10-03 ENCOUNTER — Other Ambulatory Visit: Payer: Self-pay | Admitting: Nurse Practitioner

## 2022-10-04 ENCOUNTER — Telehealth: Payer: Self-pay

## 2022-10-04 NOTE — Telephone Encounter (Signed)
Prescription refill request received for  the famotidine: Refill sent in for 90 day supply: Left message for pt to call back to schedule an office Follow up with Carl Best NP

## 2022-10-04 NOTE — Telephone Encounter (Signed)
Pt was made aware of the refill request and Carl Best NP recommendations: Pt made aware that a 90 day supply was sent in:  Pt stated that he was requesting to see Dr. Fuller Plan Pt was scheduled for an office visit with Dr. Fuller Plan on 12/07/2022 at 9:30: Pt made aware:

## 2022-12-03 ENCOUNTER — Other Ambulatory Visit (HOSPITAL_COMMUNITY): Payer: Self-pay | Admitting: Cardiology

## 2022-12-07 ENCOUNTER — Ambulatory Visit: Payer: Medicare Other | Admitting: Gastroenterology

## 2022-12-07 ENCOUNTER — Encounter: Payer: Self-pay | Admitting: Gastroenterology

## 2022-12-07 VITALS — BP 132/58 | HR 71 | Ht 66.0 in | Wt 112.0 lb

## 2022-12-07 DIAGNOSIS — K219 Gastro-esophageal reflux disease without esophagitis: Secondary | ICD-10-CM | POA: Diagnosis not present

## 2022-12-07 DIAGNOSIS — K862 Cyst of pancreas: Secondary | ICD-10-CM | POA: Diagnosis not present

## 2022-12-07 DIAGNOSIS — Z86010 Personal history of colonic polyps: Secondary | ICD-10-CM

## 2022-12-07 DIAGNOSIS — K580 Irritable bowel syndrome with diarrhea: Secondary | ICD-10-CM | POA: Diagnosis not present

## 2022-12-07 MED ORDER — FAMOTIDINE 40 MG PO TABS: ORAL_TABLET | ORAL | 3 refills | Status: DC

## 2022-12-07 MED ORDER — DICYCLOMINE HCL 10 MG PO CAPS: ORAL_CAPSULE | ORAL | 3 refills | Status: DC

## 2022-12-07 NOTE — Progress Notes (Addendum)
    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 3 times daily as needed and continue Imodium 1-2 p.o. twice daily as needed Schedule abdominal MRI/MRCP in February 2024 REV in 1 year   HPI    This is an 86 year old male returning for follow-up of GERD, IBS-D, pancreatic cyst.  He is accompanied by his wife.  He states his diarrhea is about the same recurring 2-4 times daily.  Symptoms are partially controlled with Imodium as needed.  He has frequent mild left lower quadrant pain that is completely relieved with dicyclomine.  His reflux symptoms are well-controlled on famotidine daily.  He notes no dysphagia.   Labs / Imaging       Latest Ref Rng & Units 03/17/2021    8:15 AM 10/21/2019    9:59 AM 10/20/2019    1:04 PM  Hepatic Function  Total Protein 6.5 - 8.1 g/dL 7.7  6.1  7.9   Albumin 3.5 - 5.0 g/dL 4.2  3.4  4.3   AST 15 - 41 U/L 39  31  37   ALT 0 - 44 U/L _0 Alk Phosphatase 38 - 126 U/L 83  70  91   Total Bilirubin 0.3 - 1.2 mg/dL 1.1  1.4  1.9        Latest Ref Rng & Units 10/28/2021   10:35 AM 03/17/2021    8:15 AM 01/14/2021   12:17 PM  CBC  WBC 4.0 - 10.5 K/uL 7.3  8.1  8.1   Hemoglobin 13.0 - 17.0 g/dL 15.5  13.3  14.5   Hematocrit 39.0 - 52.0 % 48.7  40.7  44.8   Platelets 150 - 400 K/uL 156  240  200     Current Medications, Allergies, Past Medical History, Past Surgical History, Family History and Social History were reviewed in [employer] record.   Physical Exam: General: Well developed, well nourished, thin, no acute distress Head: Normocephalic and atraumatic Eyes: Sclerae anicteric, EOMI Ears: Normal auditory  acuity Mouth: No deformities or lesions noted Lungs: Clear throughout to auscultation Heart: Irregular rate and rhythm; No murmurs, rubs or bruits Abdomen: Soft, non tender and non distended. No masses, hepatosplenomegaly or hernias noted. Normal Bowel sounds Rectal: Not done Musculoskeletal: Symmetrical with no gross deformities  Pulses:  Normal pulses noted Extremities: No edema or deformities noted Neurological: Alert oriented x 4, grossly nonfocal Psychological:  Alert and cooperative. Normal mood and affect   Andray Assefa T. Fuller Plan, MD 12/07/2022, 9:23 AM

## 2022-12-07 NOTE — Patient Instructions (Signed)
We have sent the following medications to your pharmacy for you to pick up at your convenience: famotidine and dicyclomine.   You have been scheduled for an MRI/MRCP at [facility] on 02/18/23. Your appointment time is 9:00am. Please arrive to admitting (at main entrance of the hospital) 30 minutes prior to your appointment time for registration purposes. Please make certain not to have anything to eat or drink 4 hours prior to your test. In addition, if you have any metal in your body, have a pacemaker or defibrillator, please be sure to let your ordering physician know. This test typically takes 45 minutes to 1 hour to complete. Should you need to reschedule, please call [phone] to do so.  The [facility] providers would like to encourage you to use [facility] to communicate with providers for non-urgent requests or questions.  Due to long hold times on the telephone, sending your provider a message by [facility] may be a faster and more efficient way to get a response.  Please allow 48 business hours for a response.  Please remember that this is for non-urgent requests.   Due to recent changes in healthcare laws, you may see the results of your imaging and laboratory studies on MyChart before your provider has had a chance to review them.  We understand that in some cases there may be results that are confusing or concerning to you. Not all laboratory results come back in the same time frame and the provider may be waiting for multiple results in order to interpret others.  Please give [place] 48 hours in order for your provider to thoroughly review all the results before contacting the office for clarification of your results.   Thank you for choosing me and [facility].  Pricilla Riffle. Dagoberto Ligas., MD., Marval Regal

## 2022-12-08 ENCOUNTER — Telehealth: Payer: Self-pay | Admitting: Gastroenterology

## 2022-12-08 NOTE — Telephone Encounter (Signed)
Patients wife states the patient has multiple vitamin d3 on his medication list and she wanted it removed so it will not be confusing to other office appts. Removed other Vitamin d3 from patient's list.

## 2022-12-08 NOTE — Telephone Encounter (Signed)
Patients wife called stated he was seen yesterday and his medication list has been flagged there is something incorrect would like a call to get it fixed.

## 2022-12-09 ENCOUNTER — Ambulatory Visit (HOSPITAL_COMMUNITY)
Admission: RE | Admit: 2022-12-09 | Discharge: 2022-12-09 | Disposition: A | Discharge status: Home / Self Care | Payer: Medicare Other | Source: Ambulatory Visit | Attending: Cardiology | Admitting: Cardiology

## 2022-12-09 ENCOUNTER — Encounter (HOSPITAL_COMMUNITY): Payer: Self-pay | Admitting: Cardiology

## 2022-12-09 VITALS — BP 140/70 | HR 66 | Wt 114.4 lb

## 2022-12-09 DIAGNOSIS — I495 Sick sinus syndrome: Secondary | ICD-10-CM | POA: Diagnosis not present

## 2022-12-09 DIAGNOSIS — I484 Atypical atrial flutter: Secondary | ICD-10-CM | POA: Insufficient documentation

## 2022-12-09 DIAGNOSIS — N183 Chronic kidney disease, stage 3 unspecified: Secondary | ICD-10-CM | POA: Diagnosis not present

## 2022-12-09 DIAGNOSIS — Z7984 Long term (current) use of oral hypoglycemic drugs: Secondary | ICD-10-CM | POA: Diagnosis not present

## 2022-12-09 DIAGNOSIS — I2510 Atherosclerotic heart disease of native coronary artery without angina pectoris: Secondary | ICD-10-CM | POA: Diagnosis not present

## 2022-12-09 DIAGNOSIS — I214 Non-ST elevation (NSTEMI) myocardial infarction: Secondary | ICD-10-CM | POA: Insufficient documentation

## 2022-12-09 DIAGNOSIS — Z950 Presence of cardiac pacemaker: Secondary | ICD-10-CM | POA: Diagnosis not present

## 2022-12-09 DIAGNOSIS — I252 Old myocardial infarction: Secondary | ICD-10-CM | POA: Diagnosis not present

## 2022-12-09 DIAGNOSIS — I4891 Unspecified atrial fibrillation: Secondary | ICD-10-CM

## 2022-12-09 DIAGNOSIS — Z79899 Other long term (current) drug therapy: Secondary | ICD-10-CM | POA: Diagnosis not present

## 2022-12-09 DIAGNOSIS — Q2112 Patent foramen ovale: Secondary | ICD-10-CM | POA: Insufficient documentation

## 2022-12-09 DIAGNOSIS — G4733 Obstructive sleep apnea (adult) (pediatric): Secondary | ICD-10-CM | POA: Insufficient documentation

## 2022-12-09 DIAGNOSIS — Z8673 Personal history of transient ischemic attack (TIA), and cerebral infarction without residual deficits: Secondary | ICD-10-CM | POA: Diagnosis not present

## 2022-12-09 DIAGNOSIS — E785 Hyperlipidemia, unspecified: Secondary | ICD-10-CM | POA: Diagnosis not present

## 2022-12-09 DIAGNOSIS — Z87891 Personal history of nicotine dependence: Secondary | ICD-10-CM | POA: Diagnosis not present

## 2022-12-09 DIAGNOSIS — Z7901 Long term (current) use of anticoagulants: Secondary | ICD-10-CM | POA: Insufficient documentation

## 2022-12-09 DIAGNOSIS — Z9049 Acquired absence of other specified parts of digestive tract: Secondary | ICD-10-CM | POA: Diagnosis not present

## 2022-12-09 DIAGNOSIS — M419 Scoliosis, unspecified: Secondary | ICD-10-CM | POA: Diagnosis not present

## 2022-12-09 DIAGNOSIS — I5033 Acute on chronic diastolic (congestive) heart failure: Secondary | ICD-10-CM | POA: Diagnosis not present

## 2022-12-09 DIAGNOSIS — K219 Gastro-esophageal reflux disease without esophagitis: Secondary | ICD-10-CM | POA: Diagnosis not present

## 2022-12-09 DIAGNOSIS — I4819 Other persistent atrial fibrillation: Secondary | ICD-10-CM | POA: Insufficient documentation

## 2022-12-09 DIAGNOSIS — I255 Ischemic cardiomyopathy: Secondary | ICD-10-CM | POA: Diagnosis not present

## 2022-12-09 DIAGNOSIS — I5032 Chronic diastolic (congestive) heart failure: Secondary | ICD-10-CM | POA: Diagnosis not present

## 2022-12-09 DIAGNOSIS — Z955 Presence of coronary angioplasty implant and graft: Secondary | ICD-10-CM | POA: Insufficient documentation

## 2022-12-09 LAB — BASIC METABOLIC PANEL
Anion gap: 10 (ref 5–15)
BUN: 15 mg/dL (ref 8–23)
CO2: 27 mmol/L (ref 22–32)
Calcium: 10 mg/dL (ref 8.9–10.3)
Chloride: 100 mmol/L (ref 98–111)
Creatinine, Ser: 1.33 mg/dL — ABNORMAL HIGH (ref 0.61–1.24)
GFR, Estimated: 53 mL/min — ABNORMAL LOW (ref >60)
Glucose, Bld: 94 mg/dL (ref 70–99)
Potassium: 4 mmol/L (ref 3.5–5.1)
Sodium: 137 mmol/L (ref 135–145)

## 2022-12-09 LAB — CBC
HCT: 45.8 % (ref 39.0–52.0)
Hemoglobin: 14.9 g/dL (ref 13.0–17.0)
MCH: 32.2 pg (ref 26.0–34.0)
MCHC: 32.5 g/dL (ref 30.0–36.0)
MCV: 98.9 fL (ref 80.0–100.0)
Platelets: 192 10*3/uL (ref 150–400)
RBC: 4.63 MIL/uL (ref 4.22–5.81)
RDW: 14.6 % (ref 11.5–15.5)
WBC: 6.4 10*3/uL (ref 4.0–10.5)
nRBC: 0 % (ref 0.0–0.2)

## 2022-12-09 LAB — MAGNESIUM: Magnesium: 2.5 mg/dL — ABNORMAL HIGH (ref 1.7–2.4)

## 2022-12-09 NOTE — Patient Instructions (Signed)
There has been no changes to your medications.  Labs done today, your results will be available in MyChart, we will contact you for abnormal readings.  Your physician recommends that you schedule a follow-up appointment in: 6 months ( June 2024)  ** please call the office in April to arrange your follow up appointment **  If you have any questions or concerns before your next appointment please send [place] a message through [place] or call our office at [phone].    TO LEAVE A MESSAGE FOR THE NURSE SELECT OPTION 2, PLEASE LEAVE A MESSAGE INCLUDING: YOUR NAME DATE OF BIRTH CALL BACK NUMBER REASON FOR CALL**this is important as we prioritize the call backs  YOU WILL RECEIVE A CALL BACK THE SAME DAY AS LONG AS YOU CALL BEFORE 4:00 PM  At the [facility], you and your health needs are our priority. As part of our continuing mission to provide you with exceptional heart care, we have created designated Provider Care Teams. These Care Teams include your primary Cardiologist (physician) and Advanced Practice Providers (APPs- Physician Assistants and Nurse Practitioners) who all work together to provide you with the care you need, when you need it.   You may see any of the following providers on your designated Care Team at your next follow up: Dr Glori Bickers Dr [name] Dr. Roxana Hires, NP Lyda Jester, [place] [facility] [place], [place] Forestine Na, NP Audry Riles, PharmD   Please be sure to bring in all your medications bottles to every appointment.

## 2022-12-09 NOTE — Progress Notes (Signed)
ID:  [name], DOB 1936-07-21, MRN [id number]  Provider location: [place] [facility] Type of Visit: Established patient  PCP:  Burnard Bunting, MD  Cardiologist:  [name]   History of Present Illness: [name] is a 86 y.o. male who has history of CAD s/p CABG.  Cath in 11/09 showed that the SVG-distal RCA was patent, the CFX system was patent.  His LIMA was atretic and there were serial 60% and 80% stenoses in the native LAD.  He was managed medically.  Echo in 8/14 showed EF 55-60% with moderate MR and moderate TR.    He was initially noted to have atrial fibrillation in the summer of 2014. He was started on Xarelto and cardioverted to NSR in 8/14.  Recurrent atrial fibrillation was noted in 1/15, and he was cardioverted to NSR again.  This time, NSR did not hold long. By 5/15, he was in persistent atrial fibrillation.  I referred him to [facility] where he had atrial fibrillation ablation and Tikosyn loading.  Ranolazine was stopped due to risk of QT prolongation and Imdur was started as an anti-anginal instead.  Lexiscan Cardiolite in 11/15 showed no ischemia.     CTA chest done in 2/16 to look for evidence for PV stenosis post-AF ablation.  This showed mild short-segment narrowing of the left inferior pulmonary vein.    Patient was admitted in 5/18 with acute on chronic diastolic CHF.  He had been at the beach for [place] days and ate out a number of times, probably getting a significant sodium load.  No chest pain.  He was in normal sinus rhythm.  He was started on IV Lasix and diuresed.  Echo in 5/18 showed EF 65-70% with moderately dilated RV and normal systolic function, PASP 57 mmHg.  Lexiscan Cardiolite in 6/18 showed no significant perfusion defect.    He was admitted with NSTEMI in 3/20, had DES to ostial LAD and mid SVG-RCA.  Echo showed EF 45-50%.  He was in atypical atrial flutter persistently despite Tikosyn.  After his intervention, he was noted to  have a radial artery pseudoaneurysm that was repaired by Dr. Trula Slade.   In 6/20, he had a redo atrial fibrillation ablation.  He was also noted to have complex flutter from multiple foci that was not ablated.  After the procedure, he has had considerable pain in the right leg at the cath site but also radiating down the leg, groin US showed no AV fistula or pseudoaneurysm.    In 10/20, patient held anticoagulation for 2 days for oral surgery and had a CVA presenting as right hand weakness.  He did not get tPA.  Echo in 10/20 showed EF 50-55%, mild LVH, moderately decreased RV function, small PFO. Carotid dopplers showed 1-39% bilateral stenosis. He was switched from Xarelto to Eliquis.   In 11/20, he went into persistent atrial fibrillation and underwent DCCV.  In 7/21, he went into atypical flutter and had DCCV to NSR.  He had a repeat atrial fibrillation ablation in 11/21.   Echo in 1/22 showed EF 60-65%, normal RV, mild-moderate MR. DCCV to NSR in 1/22.   Given significant weight loss, patient had CT chest/abdomen/pelvis in 7/22, this showed no worrisome abnormalities.   Echo in 8/23 showed EF 69-67%, grade 2 diastolic dysfunction, mild RV dilation and mild RV dysfunction, moderate LAE, mild-moderate MR, PASP 41 mmHg.   He returns for followup of CHF, atrial fibrillation, and CAD.  Rhythm today appears to be wandering atrial pacemaker, rate 60s. No palpitations or lightheadedness.  He has not had to use Lasix.  Breathing is stable, no dyspnea walking on flat ground or up stairs.  He stays active, still working part time as a Software engineer. No chest pain. Weight stable.   ECG (personally reviewed): Wandering atrial pacemaker, QTc 480 msec  Labs (8/12): K 4.1, creatinine 1.2, LDL 91, HDL 53 Labs (8/14): K 4.6, creatinine 1.1 Labs (11/14): K 4.6, creatinine 1.2, LDL 91, HDL 56 Labs (3/15): AST 38, ALT 32, TSH normal, BNP 237 Labs (7/15): LDL 96, HDL 48, K 4.2, creatinine 1.2, TSH normal, BNP 237,  AST 38, ALT 32 Labs (8/15): LFTs normal Labs (11/15): K 4.2, creatinine 1.1, Mg 2.2, BNP 227 Labs (2/16): K 4, creatinine 1.32, BNP 261 Labs (6/18): K 3.9, creatinine 1.6 Labs (7/18): K 3.9, creatinine 1.5, BNP 242 Labs (12/18): K 3.8, creatinine 1.4, hgb 13.9, LDL 73, HDL 52 Labs (3/19): K 3.7, creatinine 1.4, LDL 76, HDL 42 Labs (6/19): K 4.1, creatinine 1.63 Labs (3/20): K 4.2, creatinine 1.29, LDL 53, HDL 47, hgb 13.8 Labs (4/20): K 4.3, creatinine 1.5 Labs (6/20): K 4.2, creatinine 1.84, hgb 12.4 Labs (7/20): K 4.5, creatinine 2.53 => 1.72 Labs (8/20): K 4.7, creatinine 1.5, Mg 2.4 Labs (12/20): LDL 79, K 4.9, creatinine 1.4 Labs (2/21): K 4.5, creatinine 1.56 Labs (8/21): K 4, creatinine 1.4, LDL 69, HDL 52 Labs (12/21): K 4.4, creatinine 1.21, LDL 85 Labs (3/22): K 4.1, creatinine 1.6, hgb 13.3, BNP 197 Labs (7/22): creatinine 1.72 Labs (8/22): LDL 74, HDL 50, BNP 307, K 4.5, creatinine 1.47 Labs (12/22): LDL 74, HDL 49, K 4.1, creatinine 1.5 Labs (2/23): BNP 166, K 5.2, creatinine 1.33 Labs (5/23): K 3.8, creatinine 1.32 Labs (8/23): LDL 71, K 4.1, creatinine 1.34   PMH: 1. CAD: 1st MI in 1988 (while [place] feet [place] [employer]).  CABG 1996.  PCI to CFX in 2009.  LHC (11/09) SVG-dRCA patent, total occlusion RCA, patent CFX stent, atretic LIMA, serial 60 and 80% proximal LAD stenoses, EF 60% with basal inferior hypokinesis.  Myoview in 2011 with no ischemia or infarction.  Echo (10/12) with EF 55-60%, mild LVH, mild MR.  Lexiscan Cardiolite in 2013 with no ischemia or infarction.  Echo (8/14) with EF 55-60%, moderate MR, moderate TR, PA systolic pressure 35 mmHg.  Echo (4/15) with EF 60-65%, mild focal basal septal hypertrophy, inferior basal akinesis, mild MR.  Lexiscan cardiolite (11/15) with EF 61%, fixed basal inferoseptal defect, no ischemia.  - Lexiscan Cardiolite (6/18): EF 54%, no perfusion defect.  - NSTEMI 3/20, cath showed 95% ostial/proximal  LAD, 60-70% mid LAD, totally occluded SVG-LCx, totally occluded RCA, 80-90% mid SVG-RCA.  Patient had DES to ostial-mid LAD and DES to SVG-RCA.  2. Raynauds syndrome 3. Post-polio syndrome 4. GERD with dilation of esophageal stricture in 12/12.  5. Hyperlipidemia 6. H/o CVAs: Brainstem stroke with Horner's syndrome.  Recurrent stroke in 10/20 with right hand weakness when off anticoagulation for 2 days.  - Carotid dopplers (10/20) with minimal stenosis.  7. Scoliosis. 8. H/o appendectomy 9. Herpes Zoster 10. Atrial fibrillation/atypical atrial flutter: DCCV to NSR in 8/14. DCCV to NSR in 1/15. Atrial fibrillation ablation 6/15 with Tikosyn loading (at [facility]).   - Redo atrial fibrillation ablation in 6/20.  Patient also noted to have complex flutter with several foci, not ablated.  - Zio patch (7/20): Primarily NSR with a few  short SVT runs.  - DCCV 11/20.  - DCCV 7/21 - Redo atrial fibrillation ablation 11/21.  - DCCV 1/22 11. PFTs (4/15) with FVC 59%, FEV1 54%, ratio 91%, DLCO 53% => moderate obstructive defect thought to be related to COPD and severe restrictive defect thought to be due to elevated left hemidiaphragm and post-polio syndrome.  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  Echo (5/18) with EF 65-70%, RV moderately dilated with normal systolic function, PASP 57 mmHg.  - Echo (3/20): EF 45-50%, mild LV dilation, basal inferolateral and inferior hypokinesis.  - Echo (10/20): EF 50-55%, mild LVH, basal inferior and inferoseptal hypokinesis, moderately decreased RV systolic function, small PFO.  - Echo (1/22): EF 60-65%, normal RV, mild-moderate MR.  - Echo (8/23): EF 94-85%, grade 2 diastolic dysfunction, mild RV dilation and mild RV dysfunction, moderate LAE, mild-moderate MR, PASP 41 mmHg.  13. CKD: Stage 3.  14. OSA: dental appliance.  15. Right radial artery pseudoaneurysm: post-cath in 3/20, s/p repair.  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 H/o esophageal stricture  Current Outpatient  Medications  Medication Sig Dispense Refill   acetaminophen (TYLENOL) 500 MG tablet Take 250-500 mg by mouth every 8 (eight) hours as needed for mild pain or moderate pain.      albuterol (PROAIR HFA) 108 (90 BASE) MCG/ACT inhaler Inhale 2 puffs into the lungs every 4 (four) hours as needed for wheezing or shortness of breath (and prior to exercise). 1 Inhaler 3   Cholecalciferol (VITAMIN D3) 50 MCG (2000 UT) CAPS Take by mouth.     dicyclomine (BENTYL) 10 MG capsule TAKE ONE CAPSULE BY MOUTH 3 TIMES DAILY AS NEEDED FOR SPASMS 270 capsule 3   diltiazem (CARDIZEM CD) 240 MG 24 hr capsule TAKE 1 CAPSULE BY MOUTH AT BEDTIME 90 capsule 3   dofetilide (TIKOSYN) 250 MCG capsule TAKE 1 CAPSULE BY MOUTH EVERY 12 HOURS 180 capsule 3   ELIQUIS 2.5 MG TABS tablet TAKE 1 TABLET BY MOUTH TWICE A DAY 180 tablet 3   empagliflozin (JARDIANCE) 10 MG TABS tablet TAKE 1 TABLET BY MOUTH EVERY DAY BEFORE BREAKFAST 90 tablet 3   ezetimibe (ZETIA) 10 MG tablet TAKE 1 TABLET BY MOUTH EVERY DAY 90 tablet 3   famotidine (PEPCID) 40 MG tablet TAKE 1 TABLET BY MOUTH EVERYDAY AT BEDTIME 90 tablet 3   furosemide (LASIX) 20 MG tablet Take 1 tablet (20 mg total) by mouth as needed. 30 tablet 3   lactobacillus acidophilus & bulgar (LACTINEX) chewable tablet Chew 2 tablets by mouth at bedtime.      loperamide (IMODIUM) 2 MG capsule Take 2 mg by mouth See admin instructions. Take 1 capsule (2 mg) by mouth in the morning (0800) & may take an additional dose in the evening if needed for diarrhea/loose stools.     LORazepam (ATIVAN) 0.5 MG tablet Take 0.5 mg by mouth at bedtime.     methocarbamol (ROBAXIN) 500 MG tablet Take 500 mg by mouth 3 (three) times daily as needed for muscle spasms.      nitroGLYCERIN (NITROSTAT) 0.4 MG SL tablet Place 1 tablet (0.4 mg total) under the tongue every 5 (five) minutes as needed for chest pain. 25 tablet 0   Probiotic Product (ALIGN) 4 MG CAPS Take 4 mg by mouth daily. (0800)     rosuvastatin  (CRESTOR) 40 MG tablet TAKE 1 TABLET BY MOUTH EVERY DAY 90 tablet 3   TADALAFIL PO Take 10 mg by mouth daily in the afternoon.     Testosterone POWD  Apply 1 mL topically daily. Testosterone (AT) 150/0('150mg'$ ) cpd     traZODone (DESYREL) 50 MG tablet Take 50 mg by mouth at bedtime.     No current facility-administered medications for this encounter.    Allergies:   Morphine and related and Tape   Social History:  The patient  reports that he quit smoking about 55 years ago. His smoking use included cigarettes. He has a 2.50 pack-year smoking history. He quit smokeless tobacco use about 74 years ago. He reports that he does not drink alcohol and does not use drugs.   Family History:  The patient's family history includes Cancer in his son; Heart Problems in his brother; Heart disease in his father, maternal grandfather, maternal grandmother, mother, paternal grandfather, and paternal grandmother; Prostate cancer in his paternal uncle.   ROS:  Please see the history of present illness.   All other systems are personally reviewed and negative.   Exam:   BP (!) 140/70   Pulse 66   Wt 51.9 kg (114 lb 6.4 oz)   SpO2 97%   BMI 18.46 kg/m  General: NAD Neck: No JVD, no thyromegaly or thyroid nodule.  Lungs: Decreased breath sounds left base.  CV: Nondisplaced PMI.  Heart regular S1/S2, no S3/S4, no murmur.  No peripheral edema.  No carotid bruit.  Normal pedal pulses.  Abdomen: Soft, nontender, no hepatosplenomegaly, no distention.  Skin: Intact without lesions or rashes.  Neurologic: Alert and oriented x 3.  Psych: Normal affect. Extremities: No clubbing or cyanosis.  HEENT: Normal.   Recent Labs: 01/28/2022: B Natriuretic Peptide 165.5 12/09/2022: BUN 15; Creatinine, Ser 1.33; Hemoglobin 14.9; Magnesium 2.5; Platelets 192; Potassium 4.0; Sodium 137  Personally reviewed   Wt Readings from Last 3 Encounters:  12/09/22 51.9 kg (114 lb 6.4 oz)  12/07/22 50.8 kg (112 lb)  08/03/22 51.5 kg  (113 lb 9.6 oz)   ASSESSMENT AND PLAN:  1. Atrial fibrillation/flutter: He has been on Tikosyn and is s/p atrial fibrillation ablation at [facility] on 06/25/14.  He has also had atypical atrial flutter. He had a redo atrial fibrillation ablation in 6/20.  Complex flutter was noted from several foci and was not ablated.  Zio patch from 7/20 showed predominantly NSR with short runs SVT.  DCCV in 11/20, DCCV again in 7/21 and in 1/22. Redo atrial fibrillation ablation in 11/21.  He has significant sinus node disease/sick sinus syndrome and may eventually require pacemaker.  He appears to be in a wandering atrial pacemaker today with stable rate in 60s.  - Continue Tikosyn.  QTc today is acceptable (480 msec). Check BMET/Mg today.  - Continue apixaban, this is appropriately dosed at 2.5 mg bid with age and low weight.   - Can continue current diltiazem CD.  2. CAD: H/o CABG.  He had NSTEMI in 3/20 with DES to ostial/proximal LAD and SVG-RCA.  No chest pain.  - He will continue apixaban long-term for atrial fibrillation/flutter. CBC today.  - He is on statin and Zetia, lipids in 8/23 acceptable.  3. Hyperlipidemia: He is taking Crestor 40 mg daily and Zetia 10 mg daily.  Lipids acceptable in 8/23.  4. Ischemic cardiomyopathy/primarily diastolic CHF: Echo in 2/72 with EF 45-50%, echo in 10/20 with EF 50-55%, echo in 1/22 with EF 60-65%.  Echo in 8/23 showed EF 53-66%, grade 2 diastolic dysfunction, mild RV dilation and mild RV dysfunction, moderate LAE, mild-moderate MR, PASP 41 mmHg.  NYHA class II symptoms.  Not volume overloaded.  -  He takes Lasix prn.     - Continue Jardiance 10 mg daily. - He is off losartan with elevated creatinine.  5. Pulmonary: PFTs suggested both a restrictive and obstructive defect.  The restrictive defect is likely from post-polio syndrome and elevated left hemidiaphragm.  Mr Carsten never smoked much, so the obstructive defect is more surprising.  6. OSA: Severe, likely potentiates  his atrial fibrillation. Unable to tolerate CPAP or oral appliance.   7. CVA: 10/20, in setting of holding anticoagulation for 2 days.  Avoid holding anticoagulation in the future.  Mild residual effect on right hand dexterity.   Followup in 6 months  Signed, [name], MD  12/09/2022   Advanced [facility] Failure [address] [place] [address] [phone] (office) [phone] (fax)

## 2022-12-26 ENCOUNTER — Other Ambulatory Visit (HOSPITAL_COMMUNITY): Payer: Self-pay | Admitting: Cardiology

## 2023-01-13 ENCOUNTER — Ambulatory Visit: Payer: Medicare Other | Attending: Cardiology | Admitting: Cardiology

## 2023-01-13 ENCOUNTER — Encounter: Payer: Self-pay | Admitting: Cardiology

## 2023-01-13 VITALS — BP 114/58 | HR 60 | Ht 66.0 in | Wt 116.0 lb

## 2023-01-13 DIAGNOSIS — Z79899 Other long term (current) drug therapy: Secondary | ICD-10-CM | POA: Diagnosis not present

## 2023-01-13 DIAGNOSIS — I2510 Atherosclerotic heart disease of native coronary artery without angina pectoris: Secondary | ICD-10-CM | POA: Diagnosis not present

## 2023-01-13 DIAGNOSIS — I4819 Other persistent atrial fibrillation: Secondary | ICD-10-CM | POA: Diagnosis not present

## 2023-01-13 DIAGNOSIS — D6869 Other thrombophilia: Secondary | ICD-10-CM | POA: Diagnosis not present

## 2023-01-13 NOTE — Progress Notes (Signed)
Electrophysiology Office Note   Date:  01/13/2023   ID:  [name], DOB Dec 05, 1936, MRN [id number]  PCP:  Burnard Bunting, MD  Cardiologist:  Aundra Dubin Primary Electrophysiologist:  Chlora Mcbain Meredith Leeds, MD    No chief complaint on file.     History of Present Illness: [name] is a 87 y.o. male who is being seen today for the evaluation of atrial fibrillation/flutter at the request of Burnard Bunting, MD. Presenting today for electrophysiology evaluation.    Is a history seen for persistent atrial fibrillation, coronary artery disease, COPD, CVA, OSA, hypertension, hyperlipidemia.  He was diagnosed with atrial fibrillation several years ago and was started on dofetilide.  He is on BiPAP for sleep apnea.  He also has atypical atrial flutter.  He is post ablation 06/07/2021.  He had multiple atrial flutter circuits at the time of ablation.  He had repeat ablation 11/19/2020.  Today, denies symptoms of palpitations, chest pain, shortness of breath, orthopnea, PND, lower extremity edema, claudication, dizziness, presyncope, syncope, bleeding, or neurologic sequela. The patient is tolerating medications without difficulties.  He has overall been feeling well.  He has had a few times where his heart rate is irregular.  He feels somewhat short of breath during these times.  Aside from that, he is able to do all of his daily activities without any restriction.   Past Medical History:  Diagnosis Date   Abnormal nuclear cardiac imaging test March 2011   Has positive EKG response, no perfusion defect and normal EF   Adenomatous colon polyp 12/2002   Arrhythmia    Atrial fibrillation ([place])    a. s/p rfca;  b. chronic tikosyn and xarelto.   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 04/2017 Echo: EF 65-70%, restrictive physiology, mildly dil  LA.   COPD (chronic obstructive pulmonary disease) (HCC)    Coronary artery disease    First MI in 1988. PCI in 1996 with subsequent CABG in 1996 due to restenosis. S/P stents to LCX in 2009. Noted to have residual disease in the LAD in a diffuse manner and atretic LIMA graft. He is managed medically.    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   History of Raynaud's syndrome    Hyperlipidemia    Hypertension    Internal hemorrhoids    Muscle spasm    Myocardial infarction (HCC)    MI x1 1989 - 74 age   Neuromuscular disorder ([place])    Polio - age 65   OSA (obstructive sleep apnea) 10/16/2018   Severe OSA with AHI 37.2/hr on BiPAP   Persistent atrial fibrillation ([place])    Polio    age 24   PVC (premature ventricular contraction)    Scoliosis    mild   Past Surgical History:  Procedure Laterality Date   ABLATION     done for a fib   APPENDECTOMY     Age 81   ATRIAL FIBRILLATION ABLATION N/A 06/08/2019   Procedure: ATRIAL FIBRILLATION ABLATION;  Surgeon: Constance Haw, MD;  Location: [place] [facility];  Service:  Cardiovascular;  Laterality: N/A;   ATRIAL FIBRILLATION ABLATION N/A 11/19/2020   Procedure: ATRIAL FIBRILLATION ABLATION;  Surgeon: Constance Haw, MD;  Location: [place] [facility];  Service: Cardiovascular;  Laterality: N/A;   CARDIAC CATHETERIZATION     CARDIOVERSION N/A 08/22/2013   Procedure: CARDIOVERSION;  Surgeon: Carlena Bjornstad, MD;  Location: [place];  Service: Cardiovascular;  Laterality: N/A;   CARDIOVERSION N/A 01/16/2014   Procedure: CARDIOVERSION;  Surgeon: Lelon Perla, MD;  Location: [facility]COPY;  Service: Cardiovascular;  Laterality: N/A;   CARDIOVERSION N/A 12/11/2018   Procedure: CARDIOVERSION;  Surgeon: Larey Dresser, MD;   Location: [facility] ENDOSCOPY;  Service: Cardiovascular;  Laterality: N/A;   CARDIOVERSION N/A 11/20/2019   Procedure: CARDIOVERSION;  Surgeon: [name], MD;  Location: [facility] ENDOSCOPY;  Service: Endoscopy;  Laterality: N/A;   CARDIOVERSION N/A 07/10/2020   Procedure: CARDIOVERSION;  Surgeon: Larey Dresser, MD;  Location: [facility] ENDOSCOPY;  Service: Cardiovascular;  Laterality: N/A;   CARDIOVERSION N/A 01/21/2021   Procedure: CARDIOVERSION;  Surgeon: Larey Dresser, MD;  Location: [facility] ENDOSCOPY;  Service: Cardiovascular;  Laterality: N/A;   COLONOSCOPY  2008   CORONARY ARTERY BYPASS GRAFT  1996   with a LIMA to the LAD, SVG to RCA and SVG to OM   CORONARY STENT INTERVENTION N/A 03/26/2019   Procedure: CORONARY STENT INTERVENTION;  Surgeon: Jettie Booze, MD;  Location: [place] [facility];  Service: Cardiovascular;  Laterality: N/A;   CORONARY STENT PLACEMENT  1996   LCX   EMBOLECTOMY Right 05/09/2019   Procedure: Embolectomy of right radial artery;  Surgeon: Serafina Mitchell, MD;  Location: [place];  Service: Vascular;  Laterality: Right;   ESOPHAGEAL DILATION     EYE SURGERY     FALSE ANEURYSM REPAIR Right 05/09/2019   Procedure: REPAIR FALSE ANEURYSM RIGHT RADIAL;  Surgeon: Serafina Mitchell, MD;  Location: [place];  Service: Vascular;  Laterality: Right;   LEFT HEART CATH AND CORS/GRAFTS ANGIOGRAPHY N/A 03/26/2019   Procedure: LEFT HEART CATH AND CORS/GRAFTS ANGIOGRAPHY;  Surgeon: Larey Dresser, MD;  Location: [place] [facility];  Service: Cardiovascular;  Laterality: N/A;   POLYPECTOMY     TONSILLECTOMY     UPPER GASTROINTESTINAL ENDOSCOPY     esophegeal dilation     Current Outpatient Medications  Medication Sig Dispense Refill   acetaminophen (TYLENOL) 500 MG tablet Take 250-500 mg by mouth every 8 (eight) hours as needed for mild pain or moderate pain.      albuterol (PROAIR HFA) 108 (90 BASE) MCG/ACT inhaler Inhale 2 puffs into the lungs every 4 (four) hours as needed for  wheezing or shortness of breath (and prior to exercise). 1 Inhaler 3   Cholecalciferol (VITAMIN D3) 50 MCG (2000 UT) CAPS Take by mouth.     dicyclomine (BENTYL) 10 MG capsule TAKE ONE CAPSULE BY MOUTH 3 TIMES DAILY AS NEEDED FOR SPASMS 270 capsule 3   diltiazem (CARDIZEM CD) 240 MG 24 hr capsule TAKE 1 CAPSULE BY MOUTH AT BEDTIME 90 capsule 3   dofetilide (TIKOSYN) 250 MCG capsule TAKE 1 CAPSULE BY MOUTH EVERY 12 HOURS 180 capsule 3   ELIQUIS 2.5 MG TABS tablet TAKE 1 TABLET BY MOUTH TWICE A DAY 180 tablet 3   ezetimibe (ZETIA) 10 MG tablet TAKE 1 TABLET BY MOUTH EVERY DAY 90 tablet 3   famotidine (PEPCID) 40 MG tablet TAKE 1 TABLET BY MOUTH EVERYDAY AT BEDTIME 90 tablet 3   furosemide (LASIX) 20 MG  tablet Take 1 tablet (20 mg total) by mouth as needed. 30 tablet 3   JARDIANCE 10 MG TABS tablet TAKE 1 TABLET EVERY DAY BEFORE BREAKFAST 90 tablet 3   lactobacillus acidophilus & bulgar (LACTINEX) chewable tablet Chew 2 tablets by mouth at bedtime.      loperamide (IMODIUM) 2 MG capsule Take 2 mg by mouth See admin instructions. Take 1 capsule (2 mg) by mouth in the morning (0800) & may take an additional dose in the evening if needed for diarrhea/loose stools.     LORazepam (ATIVAN) 0.5 MG tablet Take 0.5 mg by mouth at bedtime.     methocarbamol (ROBAXIN) 500 MG tablet Take 500 mg by mouth 3 (three) times daily as needed for muscle spasms.      nitroGLYCERIN (NITROSTAT) 0.4 MG SL tablet Place 1 tablet (0.4 mg total) under the tongue every 5 (five) minutes as needed for chest pain. 25 tablet 0   Probiotic Product (ALIGN) 4 MG CAPS Take 4 mg by mouth daily. (0800)     rosuvastatin (CRESTOR) 40 MG tablet TAKE 1 TABLET BY MOUTH EVERY DAY 90 tablet 3   TADALAFIL PO Take 10 mg by mouth daily in the afternoon.     Testosterone POWD Apply 100 mg topically daily. Testosterone (AT) 150/0('150mg'$ ) cpd     traZODone (DESYREL) 50 MG tablet Take 50 mg by mouth at bedtime.     No current facility-administered  medications for this visit.    Allergies:   Morphine and related and Tape   Social History:  The patient  reports that he quit smoking about 56 years ago. His smoking use included cigarettes. He has a 2.50 pack-year smoking history. He quit smokeless tobacco use about 74 years ago. He reports that he does not drink alcohol and does not use drugs.   Family History:  The patient's family history includes Cancer in his son; Heart Problems in his brother; Heart disease in his father, maternal grandfather, maternal grandmother, mother, paternal grandfather, and paternal grandmother; Prostate cancer in his paternal uncle.   ROS:  Please see the history of present illness.   Otherwise, review of systems is positive for none.   All other systems are reviewed and negative.   PHYSICAL EXAM: VS:  BP (!) 114/58   Pulse 60   Ht '5\' 6"'$  (1.676 m)   Wt 116 lb (52.6 kg)   SpO2 96%   BMI 18.72 kg/m  , BMI Body mass index is 18.72 kg/m. GEN: Well nourished, well developed, in no acute distress  HEENT: normal  Neck: no JVD, carotid bruits, or masses Cardiac: RRR; no murmurs, rubs, or gallops,no edema  Respiratory:  clear to auscultation bilaterally, normal work of breathing GI: soft, nontender, nondistended, + BS MS: no deformity or atrophy  Skin: warm and dry Neuro:  Strength and sensation are intact Psych: euthymic mood, full affect  EKG:  EKG is ordered today. Personal review of the ekg ordered shows junctional rhythm  Recent Labs: 01/28/2022: B Natriuretic Peptide 165.5 12/09/2022: BUN 15; Creatinine, Ser 1.33; Hemoglobin 14.9; Magnesium 2.5; Platelets 192; Potassium 4.0; Sodium 137    Lipid Panel     Component Value Date/Time   CHOL 131 08/03/2022 1150   TRIG 54 08/03/2022 1150   HDL 49 08/03/2022 1150   CHOLHDL 2.7 08/03/2022 1150   VLDL 11 08/03/2022 1150   LDLCALC 71 08/03/2022 1150     Wt Readings from Last 3 Encounters:  01/13/23 116 lb (52.6 kg)  12/09/22 114 lb 6.4 oz (51.9  kg)  12/07/22 112 lb (50.8 kg)      Other studies Reviewed: Additional studies/ records that were reviewed today include: TTE 12/29/18  Review of the above records today demonstrates:  - Left ventricle: The cavity size was normal. Systolic function was   normal. The estimated ejection fraction was in the range of 55%   to 60%. Hypokinesis of the basalanteroseptal and inferoseptal   myocardium. The study is not technically sufficient to allow   evaluation of LV diastolic function. - Aortic valve: Valve mobility was restricted. Transvalvular   velocity was within the normal range. There was no stenosis.   There was no regurgitation. Mean gradient (S): 8 mm Hg. Valve   area (VTI): 1.28 cm^2. Valve area (Vmax): 1.28 cm^2. Valve area   (Vmean): 1.26 cm^2. - Mitral valve: Transvalvular velocity was within the normal range.   There was no evidence for stenosis. There was mild regurgitation. - Left atrium: The atrium was moderately dilated. - Right ventricle: The cavity size was normal. Wall thickness was   normal. Systolic function was normal. - Tricuspid valve: There was mild regurgitation. - Pulmonary arteries: Systolic pressure was within the normal   range. PA peak pressure: 35 mm Hg (S).   ASSESSMENT AND PLAN:  1.  Persistent atrial fibrillation/atypical atrial flutter: Currently on dofetilide to 50 mcg twice daily, Eliquis 2.5 mg daily.  Had multiple atrial flutter circuits at the time of ablation.  Ablation 05/29/2019.  Has had minimal episodes of atrial fibrillation.  He does continue to have A-fib, but the episodes last for few hours at a time.  Overall happy with his control.  2.  Obstructive sleep apnea: CPAP compliance encouraged  3.  Coronary artery disease: Status post CABG.  No current chest pain  4.  Hypertension: Currently well-controlled  5.  Second hypercoagulable state: Currently on Eliquis for atrial fibrillation as above  6.  High risk medication monitoring:  Currently on dofetilide, dose above for atrial fibrillation.  QTc without major abnormality.  Labs have remained stable.   Current medicines are reviewed at length with the patient today.   The patient does not have concerns regarding his medicines.  The following changes were made today: None  Labs/ tests ordered today include:  Orders Placed This Encounter  Procedures   EKG 12-Lead      Disposition:   FU 6 months  Signed, Keitha Kolk Meredith Leeds, MD  01/13/2023 10:41 AM     [facility] [address] [place] [address] [phone] (office) [phone] (fax)

## 2023-01-13 NOTE — Patient Instructions (Addendum)
Medication Instructions:  °Your physician recommends that you continue on your current medications as directed. Please refer to the Current Medication list given to you today. ° °*If you need a refill on your cardiac medications before your next appointment, please call your pharmacy* ° ° °Lab Work: °None ordered ° ° °Testing/Procedures: °None ordered ° ° °Follow-Up: °At [facility], you and your health needs are our priority.  As part of our continuing mission to provide you with exceptional heart care, we have created designated Provider Care Teams.  These Care Teams include your primary Cardiologist (physician) and Advanced Practice Providers (APPs -  Physician Assistants and Nurse Practitioners) who all work together to provide you with the care you need, when you need it. ° °Your next appointment:   °6 month(s) ° °The format for your next appointment:   °In Person ° °Provider:   °Will Camnitz, MD ° ° ° °Thank you for choosing [facility]!! ° ° °Makynli Stills, RN °[phone] °  °

## 2023-01-21 ENCOUNTER — Other Ambulatory Visit (HOSPITAL_COMMUNITY): Payer: Self-pay

## 2023-01-21 MED ORDER — NITROGLYCERIN 0.4 MG SL SUBL: 0.4000 mg | SUBLINGUAL_TABLET | SUBLINGUAL | 0 refills | Status: AC | PRN

## 2023-02-17 ENCOUNTER — Telehealth: Payer: Self-pay

## 2023-02-17 NOTE — Telephone Encounter (Signed)
The pt has been scheduled for MRI for tomorrow 02/18/23.

## 2023-02-17 NOTE — Telephone Encounter (Signed)
Left message on machine to call back  

## 2023-02-17 NOTE — Telephone Encounter (Signed)
-----   Message from Marlon Pel, RN sent at 02/17/2023  9:36 AM EST -----  ----- Message ----- From: Marlon Pel, RN Sent: 02/17/2023  12:00 AM EST To: Marlon Pel, RN  Needs MRI - see results from 02/17/21 [facility]

## 2023-02-18 ENCOUNTER — Other Ambulatory Visit: Payer: Self-pay | Admitting: Gastroenterology

## 2023-02-18 ENCOUNTER — Ambulatory Visit (HOSPITAL_COMMUNITY)
Admission: RE | Admit: 2023-02-18 | Discharge: 2023-02-18 | Disposition: A | Discharge status: Home / Self Care | Payer: Medicare Other | Source: Ambulatory Visit | Attending: Gastroenterology | Admitting: Gastroenterology

## 2023-02-18 DIAGNOSIS — K862 Cyst of pancreas: Secondary | ICD-10-CM

## 2023-02-18 MED ORDER — GADOBUTROL 1 MMOL/ML IV SOLN
5.0000 mL | Freq: Once | INTRAVENOUS | Status: AC | PRN
  Administered 2023-02-18: 5 mL via INTRAVENOUS

## 2023-02-24 ENCOUNTER — Telehealth (HOSPITAL_COMMUNITY): Payer: Self-pay

## 2023-02-24 NOTE — Telephone Encounter (Signed)
Patient advised and verbalized understanding 

## 2023-02-24 NOTE — Telephone Encounter (Signed)
Small cystic pancreatic lesion is unlikely to be malignant.  Radiologist does NOT recommend followup study.

## 2023-02-24 NOTE — Telephone Encounter (Signed)
No, it is mild.

## 2023-02-24 NOTE — Telephone Encounter (Signed)
He said they saw some fluid around his lungs and wants to know if he needs to do anything about it at this time

## 2023-02-24 NOTE — Telephone Encounter (Signed)
Patient wants you to review his most recent MRI. Please advise

## 2023-04-10 ENCOUNTER — Other Ambulatory Visit (HOSPITAL_COMMUNITY): Payer: Self-pay | Admitting: Cardiology

## 2023-05-19 ENCOUNTER — Other Ambulatory Visit: Payer: Self-pay | Admitting: Cardiology

## 2023-06-22 ENCOUNTER — Other Ambulatory Visit (HOSPITAL_COMMUNITY): Payer: Self-pay | Admitting: Cardiology

## 2023-06-23 ENCOUNTER — Ambulatory Visit (HOSPITAL_COMMUNITY)
Admission: RE | Admit: 2023-06-23 | Discharge: 2023-06-23 | Disposition: A | Discharge status: Home / Self Care | Payer: Medicare Other | Source: Ambulatory Visit | Attending: Cardiology | Admitting: Cardiology

## 2023-06-23 ENCOUNTER — Encounter (HOSPITAL_COMMUNITY): Payer: Self-pay | Admitting: Cardiology

## 2023-06-23 VITALS — BP 128/60 | HR 83 | Wt 113.8 lb

## 2023-06-23 DIAGNOSIS — I252 Old myocardial infarction: Secondary | ICD-10-CM | POA: Insufficient documentation

## 2023-06-23 DIAGNOSIS — I495 Sick sinus syndrome: Secondary | ICD-10-CM | POA: Diagnosis not present

## 2023-06-23 DIAGNOSIS — Z7901 Long term (current) use of anticoagulants: Secondary | ICD-10-CM | POA: Diagnosis not present

## 2023-06-23 DIAGNOSIS — I255 Ischemic cardiomyopathy: Secondary | ICD-10-CM | POA: Insufficient documentation

## 2023-06-23 DIAGNOSIS — Z955 Presence of coronary angioplasty implant and graft: Secondary | ICD-10-CM | POA: Insufficient documentation

## 2023-06-23 DIAGNOSIS — Z79899 Other long term (current) drug therapy: Secondary | ICD-10-CM | POA: Insufficient documentation

## 2023-06-23 DIAGNOSIS — I4819 Other persistent atrial fibrillation: Secondary | ICD-10-CM | POA: Insufficient documentation

## 2023-06-23 DIAGNOSIS — I484 Atypical atrial flutter: Secondary | ICD-10-CM

## 2023-06-23 DIAGNOSIS — Z7984 Long term (current) use of oral hypoglycemic drugs: Secondary | ICD-10-CM | POA: Diagnosis not present

## 2023-06-23 DIAGNOSIS — I2510 Atherosclerotic heart disease of native coronary artery without angina pectoris: Secondary | ICD-10-CM | POA: Diagnosis not present

## 2023-06-23 DIAGNOSIS — G4733 Obstructive sleep apnea (adult) (pediatric): Secondary | ICD-10-CM | POA: Diagnosis not present

## 2023-06-23 DIAGNOSIS — Z8673 Personal history of transient ischemic attack (TIA), and cerebral infarction without residual deficits: Secondary | ICD-10-CM | POA: Insufficient documentation

## 2023-06-23 DIAGNOSIS — Z9049 Acquired absence of other specified parts of digestive tract: Secondary | ICD-10-CM | POA: Diagnosis not present

## 2023-06-23 DIAGNOSIS — I471 Supraventricular tachycardia, unspecified: Secondary | ICD-10-CM | POA: Insufficient documentation

## 2023-06-23 DIAGNOSIS — I5032 Chronic diastolic (congestive) heart failure: Secondary | ICD-10-CM | POA: Diagnosis not present

## 2023-06-23 DIAGNOSIS — E785 Hyperlipidemia, unspecified: Secondary | ICD-10-CM | POA: Diagnosis not present

## 2023-06-23 DIAGNOSIS — R2689 Other abnormalities of gait and mobility: Secondary | ICD-10-CM | POA: Diagnosis not present

## 2023-06-23 DIAGNOSIS — Z951 Presence of aortocoronary bypass graft: Secondary | ICD-10-CM | POA: Insufficient documentation

## 2023-06-23 LAB — BASIC METABOLIC PANEL
Anion gap: 8 (ref 5–15)
BUN: 18 mg/dL (ref 8–23)
CO2: 29 mmol/L (ref 22–32)
Calcium: 10.2 mg/dL (ref 8.9–10.3)
Chloride: 102 mmol/L (ref 98–111)
Creatinine, Ser: 1.33 mg/dL — ABNORMAL HIGH (ref 0.61–1.24)
GFR, Estimated: 52 mL/min — ABNORMAL LOW (ref >60)
Glucose, Bld: 88 mg/dL (ref 70–99)
Potassium: 4.5 mmol/L (ref 3.5–5.1)
Sodium: 139 mmol/L (ref 135–145)

## 2023-06-23 LAB — BRAIN NATRIURETIC PEPTIDE: B Natriuretic Peptide: 159.7 pg/mL — ABNORMAL HIGH (ref 0.0–100.0)

## 2023-06-23 LAB — CBC
HCT: 48.1 % (ref 39.0–52.0)
Hemoglobin: 15.6 g/dL (ref 13.0–17.0)
MCH: 31.6 pg (ref 26.0–34.0)
MCHC: 32.4 g/dL (ref 30.0–36.0)
MCV: 97.4 fL (ref 80.0–100.0)
Platelets: 154 10*3/uL (ref 150–400)
RBC: 4.94 MIL/uL (ref 4.22–5.81)
RDW: 13.8 % (ref 11.5–15.5)
WBC: 6.8 10*3/uL (ref 4.0–10.5)
nRBC: 0 % (ref 0.0–0.2)

## 2023-06-23 LAB — LIPID PANEL
Cholesterol: 145 mg/dL (ref 0–200)
HDL: 56 mg/dL (ref >40)
LDL Cholesterol: 79 mg/dL (ref 0–99)
Total CHOL/HDL Ratio: 2.6 RATIO
Triglycerides: 48 mg/dL (ref <150)
VLDL: 10 mg/dL (ref 0–40)

## 2023-06-23 NOTE — Patient Instructions (Signed)
There has been no changes to your medications.  Labs done today, your results will be available in MyChart, we will contact you for abnormal readings.  You have been referred to Physical Therapy. You will be called to have the appointment arranged.  Your physician recommends that you schedule a follow-up appointment in: 6 months ( December) ** please call the office in October in to arrange your follow up appointment. **  If you have any questions or concerns before your next appointment please send [place] a message through [place] or call our office at [phone].    TO LEAVE A MESSAGE FOR THE NURSE SELECT OPTION 2, PLEASE LEAVE A MESSAGE INCLUDING: YOUR NAME DATE OF BIRTH CALL BACK NUMBER REASON FOR CALL**this is important as we prioritize the call backs  YOU WILL RECEIVE A CALL BACK THE SAME DAY AS LONG AS YOU CALL BEFORE 4:00 PM  At the [facility], you and your health needs are our priority. As part of our continuing mission to provide you with exceptional heart care, we have created designated Provider Care Teams. These Care Teams include your primary Cardiologist (physician) and Advanced Practice Providers (APPs- Physician Assistants and Nurse Practitioners) who all work together to provide you with the care you need, when you need it.   You may see any of the following providers on your designated Care Team at your next follow up: Dr Arvilla Meres Dr Marca Ancona Dr. Marcos Eke, NP Robbie Lis, [place] [facility], [place] Brynda Peon, NP Karle Plumber, PharmD   Please be sure to bring in all your medications bottles to every appointment.    Thank you for choosing [facility]

## 2023-06-24 NOTE — Addendum Note (Signed)
Encounter addended by: Laurey Morale, MD on: 06/24/2023 11:30 AM  Actions taken: Clinical Note Signed

## 2023-06-24 NOTE — Progress Notes (Addendum)
ID:  [name], DOB 1936-02-25, MRN [id number]  Provider location: [place] [facility] Type of Visit: Established patient  PCP:  Geoffry Paradise, MD Cardiologist:  Dr. Shirlee Latch   History of Present Illness: [name] is a 87 y.o. male who has history of CAD s/p CABG.  Cath in 11/09 showed that the SVG-distal RCA was patent, the CFX system was patent.  His LIMA was atretic and there were serial 60% and 80% stenoses in the native LAD.  He was managed medically.  Echo in 8/14 showed EF 55-60% with moderate MR and moderate TR.    He was initially noted to have atrial fibrillation in the summer of 2014. He was started on Xarelto and cardioverted to NSR in 8/14.  Recurrent atrial fibrillation was noted in 1/15, and he was cardioverted to NSR again.  This time, NSR did not hold long. By 5/15, he was in persistent atrial fibrillation.  I referred him to [facility] where he had atrial fibrillation ablation and Tikosyn loading.  Ranolazine was stopped due to risk of QT prolongation and Imdur was started as an anti-anginal instead.  Lexiscan Cardiolite in 11/15 showed no ischemia.     CTA chest done in 2/16 to look for evidence for PV stenosis post-AF ablation.  This showed mild short-segment narrowing of the left inferior pulmonary vein.    Patient was admitted in 5/18 with acute on chronic diastolic CHF.  He had been at the beach for [place] days and ate out a number of times, probably getting a significant sodium load.  No chest pain.  He was in normal sinus rhythm.  He was started on IV Lasix and diuresed.  Echo in 5/18 showed EF 65-70% with moderately dilated RV and normal systolic function, PASP 57 mmHg.  Lexiscan Cardiolite in 6/18 showed no significant perfusion defect.    He was admitted with NSTEMI in 3/20, had DES to ostial LAD and mid SVG-RCA.  Echo showed EF 45-50%.  He was in atypical atrial flutter persistently despite Tikosyn.  After his intervention, he was noted to  have a radial artery pseudoaneurysm that was repaired by Dr. Myra Gianotti.   In 6/20, he had a redo atrial fibrillation ablation.  He was also noted to have complex flutter from multiple foci that was not ablated.  After the procedure, he has had considerable pain in the right leg at the cath site but also radiating down the leg, groin US showed no AV fistula or pseudoaneurysm.    In 10/20, patient held anticoagulation for 2 days for oral surgery and had a CVA presenting as right hand weakness.  He did not get tPA.  Echo in 10/20 showed EF 50-55%, mild LVH, moderately decreased RV function, small PFO. Carotid dopplers showed 1-39% bilateral stenosis. He was switched from Xarelto to Eliquis.   In 11/20, he went into persistent atrial fibrillation and underwent DCCV.  In 7/21, he went into atypical flutter and had DCCV to NSR.  He had a repeat atrial fibrillation ablation in 11/21.   Echo in 1/22 showed EF 60-65%, normal RV, mild-moderate MR. DCCV to NSR in 1/22.   Given significant weight loss, patient had CT chest/abdomen/pelvis in 7/22, this showed no worrisome abnormalities.   Echo in 8/23 showed EF 55-60%, grade 2 diastolic dysfunction, mild RV dilation and mild RV dysfunction, moderate LAE, mild-moderate MR, PASP 41 mmHg.   He returns for followup of CHF, atrial fibrillation, and CAD.  Rhythm today appears to be atypical flutter with 2:1 block. He reports only occasional palpitations.  No dyspnea walking on flat ground.  He does get tired walking up inclines.  No orthopnea/PND.  No chest pain. Rarely takes Lasix.  Main complaint is balance difficulty that he has had for about 2 months.  He does not feel lightheaded or like he will pass out.  BP has not been low. Weight down 1 lb.   ECG (personally reviewed): Atypical atrial flutter with 2:1 block, QTc 484  Labs (8/12): K 4.1, creatinine 1.2, LDL 91, HDL 53 Labs (8/14): K 4.6, creatinine 1.1 Labs (11/14): K 4.6, creatinine 1.2, LDL 91, HDL 56 Labs  (3/15): AST 38, ALT 32, TSH normal, BNP 237 Labs (7/15): LDL 96, HDL 48, K 4.2, creatinine 1.2, TSH normal, BNP 237, AST 38, ALT 32 Labs (8/15): LFTs normal Labs (11/15): K 4.2, creatinine 1.1, Mg 2.2, BNP 227 Labs (2/16): K 4, creatinine 1.32, BNP 261 Labs (6/18): K 3.9, creatinine 1.6 Labs (7/18): K 3.9, creatinine 1.5, BNP 242 Labs (12/18): K 3.8, creatinine 1.4, hgb 13.9, LDL 73, HDL 52 Labs (3/19): K 3.7, creatinine 1.4, LDL 76, HDL 42 Labs (6/19): K 4.1, creatinine 1.63 Labs (3/20): K 4.2, creatinine 1.29, LDL 53, HDL 47, hgb 13.8 Labs (4/20): K 4.3, creatinine 1.5 Labs (6/20): K 4.2, creatinine 1.84, hgb 12.4 Labs (7/20): K 4.5, creatinine 2.53 => 1.72 Labs (8/20): K 4.7, creatinine 1.5, Mg 2.4 Labs (12/20): LDL 79, K 4.9, creatinine 1.4 Labs (2/21): K 4.5, creatinine 1.56 Labs (8/21): K 4, creatinine 1.4, LDL 69, HDL 52 Labs (12/21): K 4.4, creatinine 1.21, LDL 85 Labs (3/22): K 4.1, creatinine 1.6, hgb 13.3, BNP 197 Labs (7/22): creatinine 1.72 Labs (8/22): LDL 74, HDL 50, BNP 307, K 4.5, creatinine 1.47 Labs (12/22): LDL 74, HDL 49, K 4.1, creatinine 1.5 Labs (2/23): BNP 166, K 5.2, creatinine 1.33 Labs (5/23): K 3.8, creatinine 1.32 Labs (8/23): LDL 71, K 4.1, creatinine 1.61 Labs (12/23): K 4, creatinine 1.33   PMH: 1. CAD: 1st MI in 1988 (while [place] feet on hiking [place] at [employer]).  CABG 1996.  PCI to CFX in 2009.  LHC (11/09) SVG-dRCA patent, total occlusion RCA, patent CFX stent, atretic LIMA, serial 60 and 80% proximal LAD stenoses, EF 60% with basal inferior hypokinesis.  Myoview in 2011 with no ischemia or infarction.  Echo (10/12) with EF 55-60%, mild LVH, mild MR.  Lexiscan Cardiolite in 2013 with no ischemia or infarction.  Echo (8/14) with EF 55-60%, moderate MR, moderate TR, PA systolic pressure 35 mmHg.  Echo (4/15) with EF 60-65%, mild focal basal septal hypertrophy, inferior basal akinesis, mild MR.  Lexiscan cardiolite (11/15) with EF 61%,  fixed basal inferoseptal defect, no ischemia.  - Lexiscan Cardiolite (6/18): EF 54%, no perfusion defect.  - NSTEMI 3/20, cath showed 95% ostial/proximal LAD, 60-70% mid LAD, totally occluded SVG-LCx, totally occluded RCA, 80-90% mid SVG-RCA.  Patient had DES to ostial-mid LAD and DES to SVG-RCA.  2. Raynauds syndrome 3. Post-polio syndrome 4. GERD with dilation of esophageal stricture in 12/12.  5. Hyperlipidemia 6. H/o CVAs: Brainstem stroke with Horner's syndrome.  Recurrent stroke in 10/20 with right hand weakness when off anticoagulation for 2 days.  - Carotid dopplers (10/20) with minimal stenosis.  7. Scoliosis. 8. H/o appendectomy 9. Herpes Zoster 10. Atrial fibrillation/atypical atrial flutter: DCCV to NSR in 8/14. DCCV to NSR in 1/15. Atrial fibrillation ablation 6/15 with Tikosyn loading (at  [facility]).   - Redo atrial fibrillation ablation in 6/20.  Patient also noted to have complex flutter with several foci, not ablated.  - Zio patch (7/20): Primarily NSR with a few short SVT runs.  - DCCV 11/20.  - DCCV 7/21 - Redo atrial fibrillation ablation 11/21.  - DCCV 1/22 11. PFTs (4/15) with FVC 59%, FEV1 54%, ratio 91%, DLCO 53% => moderate obstructive defect thought to be related to COPD and severe restrictive defect thought to be due to elevated left hemidiaphragm and post-polio syndrome.  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  Echo (5/18) with EF 65-70%, RV moderately dilated with normal systolic function, PASP 57 mmHg.  - Echo (3/20): EF 45-50%, mild LV dilation, basal inferolateral and inferior hypokinesis.  - Echo (10/20): EF 50-55%, mild LVH, basal inferior and inferoseptal hypokinesis, moderately decreased RV systolic function, small PFO.  - Echo (1/22): EF 60-65%, normal RV, mild-moderate MR.  - Echo (8/23): EF 55-60%, grade 2 diastolic dysfunction, mild RV dilation and mild RV dysfunction, moderate LAE, mild-moderate MR, PASP 41 mmHg.  13. CKD: Stage 3.  14. OSA: dental appliance.  15.  Right radial artery pseudoaneurysm: post-cath in 3/20, s/p repair.  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 H/o esophageal stricture  Current Outpatient Medications  Medication Sig Dispense Refill   acetaminophen (TYLENOL) 500 MG tablet Take 250-500 mg by mouth every 8 (eight) hours as needed for mild pain or moderate pain.      albuterol (PROAIR HFA) 108 (90 BASE) MCG/ACT inhaler Inhale 2 puffs into the lungs every 4 (four) hours as needed for wheezing or shortness of breath (and prior to exercise). 1 Inhaler 3   Cholecalciferol (VITAMIN D3) 50 MCG (2000 UT) CAPS Take by mouth.     dicyclomine (BENTYL) 10 MG capsule TAKE ONE CAPSULE BY MOUTH 3 TIMES DAILY AS NEEDED FOR SPASMS 270 capsule 3   diltiazem (CARDIZEM CD) 240 MG 24 hr capsule TAKE 1 CAPSULE BY MOUTH AT BEDTIME 90 capsule 3   dofetilide (TIKOSYN) 250 MCG capsule TAKE 1 CAPSULE BY MOUTH EVERY 12 HOURS 180 capsule 3   ELIQUIS 2.5 MG TABS tablet TAKE 1 TABLET BY MOUTH TWICE A DAY 180 tablet 3   ezetimibe (ZETIA) 10 MG tablet TAKE 1 TABLET BY MOUTH EVERY DAY 90 tablet 3   famotidine (PEPCID) 40 MG tablet TAKE 1 TABLET BY MOUTH EVERYDAY AT BEDTIME 90 tablet 3   furosemide (LASIX) 20 MG tablet Take 1 tablet (20 mg total) by mouth as needed. 30 tablet 3   JARDIANCE 10 MG TABS tablet TAKE 1 TABLET EVERY DAY BEFORE BREAKFAST 90 tablet 3   lactobacillus acidophilus & bulgar (LACTINEX) chewable tablet Chew 2 tablets by mouth at bedtime.      loperamide (IMODIUM) 2 MG capsule Take 2 mg by mouth See admin instructions. Take 1 capsule (2 mg) by mouth in the morning (0800) & may take an additional dose in the evening if needed for diarrhea/loose stools.     LORazepam (ATIVAN) 0.5 MG tablet Take 0.5 mg by mouth at bedtime.     methocarbamol (ROBAXIN) 500 MG tablet Take 500 mg by mouth 3 (three) times daily as needed for muscle spasms.      nitroGLYCERIN (NITROSTAT) 0.4 MG SL tablet Place 1 tablet (0.4 mg total) under the tongue every 5 (five) minutes as  needed for chest pain. 25 tablet 0   Probiotic Product (ALIGN) 4 MG CAPS Take 4 mg by mouth daily. (0800)     rosuvastatin (CRESTOR) 40 MG  tablet TAKE 1 TABLET BY MOUTH EVERY DAY 90 tablet 3   TADALAFIL PO Take 10 mg by mouth daily in the afternoon.     Testosterone POWD Apply 100 mg topically daily. Testosterone (AT) 150/0(150mg ) cpd     traZODone (DESYREL) 50 MG tablet Take 50 mg by mouth at bedtime.     No current facility-administered medications for this encounter.    Allergies:   Morphine and codeine and Tape   Social History:  The patient  reports that he quit smoking about 56 years ago. His smoking use included cigarettes. He has a 2.50 pack-year smoking history. He quit smokeless tobacco use about 74 years ago. He reports that he does not drink alcohol and does not use drugs.   Family History:  The patient's family history includes Cancer in his son; Heart Problems in his brother; Heart disease in his father, maternal grandfather, maternal grandmother, mother, paternal grandfather, and paternal grandmother; Prostate cancer in his paternal uncle.   ROS:  Please see the history of present illness.   All other systems are personally reviewed and negative.   Exam:   BP 128/60   Pulse 83   Wt 51.6 kg (113 lb 12.8 oz)   SpO2 99%   BMI 18.37 kg/m  General: NAD, thin Neck: No JVD, no thyromegaly or thyroid nodule.  Lungs: Clear to auscultation bilaterally with normal respiratory effort. CV: Nondisplaced PMI.  Heart regular S1/S2, no S3/S4, no murmur.  No peripheral edema.  No carotid bruit.  Normal pedal pulses.  Abdomen: Soft, nontender, no hepatosplenomegaly, no distention.  Skin: Intact without lesions or rashes.  Neurologic: Alert and oriented x 3.  Psych: Normal affect. Extremities: No clubbing or cyanosis.  HEENT: Normal.   Recent Labs: 12/09/2022: Magnesium 2.5 06/23/2023: B Natriuretic Peptide 159.7; BUN 18; Creatinine, Ser 1.33; Hemoglobin 15.6; Platelets 154; Potassium  4.5; Sodium 139  Personally reviewed   Wt Readings from Last 3 Encounters:  06/23/23 51.6 kg (113 lb 12.8 oz)  01/13/23 52.6 kg (116 lb)  12/09/22 51.9 kg (114 lb 6.4 oz)   ASSESSMENT AND PLAN:  1. Atrial fibrillation/flutter: He has been on Tikosyn and is s/p atrial fibrillation ablation at [facility] on 06/25/14.  He has also had atypical atrial flutter. He had a redo atrial fibrillation ablation in 6/20.  Complex flutter was noted from several foci and was not ablated.  Zio patch from 7/20 showed predominantly NSR with short runs SVT.  DCCV in 11/20, DCCV again in 7/21 and in 1/22. Redo atrial fibrillation ablation in 11/21.  He has significant sinus node disease/sick sinus syndrome and may eventually require pacemaker.  Today, he is in atypical flutter with 2:1 block with stable HR around 80. Minimally symptomatic.  - Continue Tikosyn.  QTc today is acceptable (484 msec). Check BMET/Mg today.  - Continue apixaban, this is appropriately dosed at 2.5 mg bid with age and low weight.   - Can continue current diltiazem CD.  2. CAD: H/o CABG.  He had NSTEMI in 3/20 with DES to ostial/proximal LAD and SVG-RCA.  No chest pain.  - He will continue apixaban long-term for atrial fibrillation/flutter. CBC today.  - He is on statin and Zetia, check lipids today.  3. Hyperlipidemia: He is taking Crestor 40 mg daily and Zetia 10 mg daily.  Check lipids today.   4. Ischemic cardiomyopathy/primarily diastolic CHF: Echo in 3/20 with EF 45-50%, echo in 10/20 with EF 50-55%, echo in 1/22 with EF 60-65%.  Echo in  8/23 showed EF 55-60%, grade 2 diastolic dysfunction, mild RV dilation and mild RV dysfunction, moderate LAE, mild-moderate MR, PASP 41 mmHg.  NYHA class II symptoms.  Not volume overloaded.  - He takes Lasix prn.     - Continue Jardiance 10 mg daily. - He is off losartan with elevated creatinine.  5. Pulmonary: PFTs suggested both a restrictive and obstructive defect.  The restrictive defect is likely from  post-polio syndrome and elevated left hemidiaphragm.  Mr Thome never smoked much, so the obstructive defect is more surprising.  6. OSA: Severe, likely potentiates his atrial fibrillation. Unable to tolerate CPAP or oral appliance.   7. CVA: 10/20, in setting of holding anticoagulation for 2 days.  Avoid holding anticoagulation in the future.  Mild residual effect on right hand dexterity.  8. Imbalance: Very limiting.  Does not seem orthostatic, does seem like a balance issue. Worried he will fall.  - Refer to PT for balance training.   Followup in 6 months  Signed, Marca Ancona, MD  06/24/2023   [facility] [address] [facility] [place] [address] [phone] (office) [phone] (fax)

## 2023-07-15 NOTE — Therapy (Addendum)
OUTPATIENT PHYSICAL THERAPY LOWER EXTREMITY EVALUATION/DISCHARGE   Patient Name: [name] MRN: [id number] DOB:May 31, 1936, 87 y.o., male Today's Date: 11/11/2023  END OF SESSION:    Past Medical History:  Diagnosis Date   Abnormal nuclear cardiac imaging test March 2011   Has positive EKG response, no perfusion defect and normal EF   Adenomatous colon polyp 12/2002   Arrhythmia    Atrial fibrillation (HCC)    a. s/p rfca;  b. chronic tikosyn and xarelto.   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 04/2017 Echo: EF 65-70%, restrictive physiology, mildly dil LA.   COPD (chronic obstructive pulmonary disease) (HCC)    Coronary artery disease    First MI in 1988. PCI in 1996 with subsequent CABG in 1996 due to restenosis. S/P stents to LCX in 2009. Noted to have residual disease in the LAD in a diffuse manner and atretic LIMA graft. He is managed medically.    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   History of Raynaud's syndrome    Hyperlipidemia    Hypertension    Internal hemorrhoids    Muscle spasm    Myocardial infarction (HCC)    MI x1 1989 - 87 age   Neuromuscular disorder (HCC)    Polio - age 73   OSA (obstructive sleep apnea) 10/16/2018   Severe OSA with AHI 37.2/hr on BiPAP   Persistent atrial fibrillation (HCC)    Polio    age 33   PVC (premature ventricular contraction)    Scoliosis    mild   Past Surgical History:  Procedure Laterality Date   ABLATION     done for a fib   APPENDECTOMY     Age 89   ATRIAL FIBRILLATION ABLATION N/A 06/08/2019   Procedure: ATRIAL FIBRILLATION ABLATION;  Surgeon: Regan Lemming, MD;  Location: [facility] IN[facility]ASIVE CV LAB;  Service: Cardiovascular;  Laterality: N/A;   ATRIAL FIBRILLATION ABLATION N/A 11/19/2020   Procedure: ATRIAL FIBRILLATION ABLATION;  Surgeon: Regan Lemming, MD;  Location: [facility];  Service: Cardiovascular;  Laterality: N/A;   CARDIAC CATHETERIZATION     CARDIOVERSION N/A 08/22/2013   Procedure: CARDIOVERSION;  Surgeon: Luis Abed, MD;  Location: [facility] ENDOSCOPY;  Service: Cardiovascular;  Laterality: N/A;   CARDIOVERSION N/A 01/16/2014   Procedure: CARDIOVERSION;  Surgeon: Lewayne Bunting, MD;  Location: [facility] ENDOSCOPY;  Service: Cardiovascular;  Laterality: N/A;   CARDIOVERSION N/A 12/11/2018   Procedure: CARDIOVERSION;  Surgeon: Laurey Morale, MD;  Location: [facility] ENDOSCOPY;  Service: Cardiovascular;  Laterality: N/A;   CARDIOVERSION N/A 11/20/2019   Procedure: CARDIOVERSION;  Surgeon: Little Ishikawa, MD;  Location: [facility] ENDOSCOPY;  Service: Endoscopy;  Laterality: N/A;   CARDIOVERSION N/A 07/10/2020   Procedure: CARDIOVERSION;  Surgeon: Laurey Morale, MD;  Location: [facility] ENDOSCOPY;  Service: Cardiovascular;  Laterality: N/A;   CARDIOVERSION N/A 01/21/2021   Procedure: CARDIOVERSION;  Surgeon: Laurey Morale, MD;  Location: [facility] ENDOSCOPY;  Service: Cardiovascular;  Laterality: N/A;   CARDIOVERSION N/A 09/30/2023   Procedure: CARDIOVERSION;  Surgeon: Tessa Lerner, DO;  Location: [facility]  INVASIVE CV LAB;  Service: Cardiovascular;  Laterality: N/A;   COLONOSCOPY  2008   CORONARY ARTERY BYPASS GRAFT  1996   with a LIMA to the LAD, SVG to RCA and SVG to OM   CORONARY STENT INTERVENTION N/A 03/26/2019   Procedure: CORONARY STENT INTERVENTION;  Surgeon: Corky Crafts, MD;  Location: [facility] INVASIVE CV LAB;  Service: Cardiovascular;  Laterality: N/A;   CORONARY STENT PLACEMENT  1996   LCX   EMBOLECTOMY Right 05/09/2019   Procedure: Embolectomy of right radial artery;  Surgeon: Nada Libman, MD;  Location: [facility] OR;  Service:  Vascular;  Laterality: Right;   ESOPHAGEAL DILATION     EYE SURGERY     FALSE ANEURYSM REPAIR Right 05/09/2019   Procedure: REPAIR FALSE ANEURYSM RIGHT RADIAL;  Surgeon: Nada Libman, MD;  Location: [facility] OR;  Service: Vascular;  Laterality: Right;   LEFT HEART CATH AND CORS/GRAFTS ANGIOGRAPHY N/A 03/26/2019   Procedure: LEFT HEART CATH AND CORS/GRAFTS ANGIOGRAPHY;  Surgeon: Laurey Morale, MD;  Location: [facility] INVASIVE CV LAB;  Service: Cardiovascular;  Laterality: N/A;   POLYPECTOMY     TONSILLECTOMY     UPPER GASTROINTESTINAL ENDOSCOPY     esophegeal dilation   Patient Active Problem List   Diagnosis Date Noted   Atrial flutter (HCC) 11/13/2019   Secondary hypercoagulable state (HCC) 11/13/2019   CVA (cerebral vascular accident) (HCC) 10/20/2019   Bleeding gums 10/20/2019   Unstable angina (HCC) 03/25/2019   OSA (obstructive sleep apnea) 10/16/2018   Chronic diastolic (congestive) heart failure (HCC) 05/20/2017   Allergic rhinitis 11/17/2015   Acute on chronic diastolic CHF (congestive heart failure), NYHA class 4 (HCC) 01/28/2015   Chest pain on exertion 11/14/2014   Cerebral artery occlusion 06/24/2014   Acid reflux 06/24/2014   H/O cardiovascular disorder 06/24/2014   H/O acute poliomyelitis 06/24/2014   Extrasystole 06/24/2014   A-fib (HCC) 06/24/2014   Cerebral artery occlusion with cerebral infarction (HCC) 06/24/2014   COPD (chronic obstructive pulmonary disease) (HCC) 05/02/2014   CAFL (chronic airflow limitation) (HCC) 05/02/2014   Atrial fibrillation (HCC) 11/11/2013   CAD (coronary artery disease) 09/01/2011   Hyperlipidemia 09/01/2011   Atherosclerosis of coronary artery 09/01/2011   History of Raynaud's syndrome    History of post-polio syndrome    GERD (gastroesophageal reflux disease)    PVC (premature ventricular contraction)    Brainstem stroke (HCC)    Scoliosis     PCP: Geoffry Paradise, MD   REFERRING PROVIDER: Laurey Morale, MD  REFERRING  DIAG: Imbalance/falls needs balance training.  THERAPY DIAG:  Other abnormalities of gait and mobility - Plan: PT plan of care cert/re-cert  BPPV (benign paroxysmal positional vertigo), left - Plan: PT plan of care cert/re-cert  Rationale for Evaluation and Treatment: Rehabilitation  ONSET DATE: chronic  SUBJECTIVE:   SUBJECTIVE STATEMENT: Describes a history of 6-8 month history of imbalance described as lightheadedness as well as disorientation.  Has fallen twice but feels falls were related to missteps as oppose to disorientation.  Symptoms reproduced with rotational head motions, directional changes and maneuvering in shower in vision removed environment.  PERTINENT HISTORY: Imbalance: Very limiting.  Does not seem orthostatic, does seem like a balance issue. Worried he will fall.  - Refer to PT for balance training.  PAIN:  Are you having pain? No  PRECAUTIONS: Other: CHF  RED FLAGS: None   WEIGHT BEARING RESTRICTIONS: No  FALLS:  Has patient fallen in last 6 months? Yes. Number of falls  2  OCCUPATION: retired  PLOF: Independent  PATIENT GOALS: To get more steady on my feet  NEXT MD VISIT: as needed  OBJECTIVE:   DIAGNOSTIC FINDINGS: none  PATIENT SURVEYS:  FOTO 57(60 predicted)  MUSCLE LENGTH: deferred  POSTURE: No Significant postural limitations  PALPATION: deferred  LOWER EXTREMITY ROM: WNL throughout  Active ROM Right eval Left eval  Hip flexion    Hip extension    Hip abduction    Hip adduction    Hip internal rotation    Hip external rotation    Knee flexion    Knee extension    Ankle dorsiflexion    Ankle plantarflexion    Ankle inversion    Ankle eversion     (Blank rows = not tested)  LOWER EXTREMITY MMT: WFL throughout  MMT Right eval Left eval  Hip flexion    Hip extension    Hip abduction    Hip adduction    Hip internal rotation    Hip external rotation    Knee flexion    Knee extension    Ankle dorsiflexion     Ankle plantarflexion 5 5  Ankle inversion    Ankle eversion     (Blank rows = not tested)  LOWER EXTREMITY SPECIAL TESTS:  deferred  FUNCTIONAL TESTS:  5 times sit to stand: 12s arms crossed mCTSIB Able to hold all 4 positions for 30s   07/18/23 0001  Dynamic Gait Index  Level Surface 3  Change in Gait Speed 3  Gait with Horizontal Head Turns 2  Gait with Vertical Head Turns 3  Gait and Pivot Turn 3  Step Over Obstacle 3  Step Around Obstacles 3  Steps 3  Total Score 23     GAIT: Distance walked: 101ftx2 Assistive device utilized: None Level of assistance: Complete Independence Comments: unremarkable   TODAY'S TREATMENT:                                                                                                                              DATE: 07/18/23 Eval    PATIENT EDUCATION:  Education details: Discussed eval findings, rehab rationale and POC and patient is in agreement  Person educated: Patient Education method: Explanation Education comprehension: verbalized understanding and needs further education  HOME EXERCISE PROGRAM: TBD  ASSESSMENT:  CLINICAL IMPRESSION: Patient is a 87 y.o. male who was seen today for physical therapy evaluation and treatment for balance issues and instability with movement.  His MD has ruled out orthostatic hypotension.  Patient presents with good LE AROM and strength and symptoms onset with position changes and rotational head movements.  No balance deficits identified with mCTSIB or DGI, 5x STS test did not reveal any gross strength deficits.  Recommend outpatient neurorehab consult to address and identify suspected BPPV symptoms   OBJECTIVE IMPAIRMENTS: Abnormal gait, difficulty walking, and dizziness.   ACTIVITY LIMITATIONS: bending, locomotion level, and rapid direction change  PERSONAL FACTORS: Age, Fitness,  and Time since onset of injury/illness/exacerbation are also affecting patient's functional outcome.   REHAB  POTENTIAL: Good  CLINICAL DECISION MAKING: Stable/uncomplicated  EVALUATION COMPLEXITY: Low   GOALS: Goals reviewed with patient? No  SHORT TERM GOALS: Target date: 08/01/2023   Patient to demonstrate independence in HEP  Baseline: TBD Goal status: INITIAL  2.  Assess for BPPV dysfunction Baseline: TBD Goal status: INITIAL   LONG TERM GOALS: Target date: 09/12/2023  Increase FOTO score to 60 Baseline: 57 Goal status: INITIAL    PLAN:  PT FREQUENCY: 1-2x/week  PT DURATION: 6 weeks  PLANNED INTERVENTIONS: Therapeutic exercises, Therapeutic activity, Neuromuscular re-education, Balance training, Gait training, Patient/Family education, Self Care, Joint mobilization, Stair training, DME instructions, Dry Needling, Spinal mobilization, Cryotherapy, Moist heat, Manual therapy, and Re-evaluation  PLAN FOR NEXT SESSION: Refer to neurorehab clinic to assess for suspected BPPV dysfunction.   Hildred Laser, PT 11/11/2023, 8:35 AM

## 2023-07-18 ENCOUNTER — Ambulatory Visit: Payer: Medicare Other | Attending: Cardiology

## 2023-07-18 ENCOUNTER — Other Ambulatory Visit: Payer: Self-pay

## 2023-07-18 DIAGNOSIS — I5032 Chronic diastolic (congestive) heart failure: Secondary | ICD-10-CM | POA: Insufficient documentation

## 2023-07-18 DIAGNOSIS — R2689 Other abnormalities of gait and mobility: Secondary | ICD-10-CM | POA: Diagnosis present

## 2023-07-18 DIAGNOSIS — H8112 Benign paroxysmal vertigo, left ear: Secondary | ICD-10-CM | POA: Diagnosis present

## 2023-07-28 ENCOUNTER — Other Ambulatory Visit (HOSPITAL_COMMUNITY): Payer: Self-pay | Admitting: Cardiology

## 2023-08-08 ENCOUNTER — Ambulatory Visit: Payer: Medicare Other | Attending: Cardiology | Admitting: Physical Therapy

## 2023-08-08 DIAGNOSIS — R2689 Other abnormalities of gait and mobility: Secondary | ICD-10-CM | POA: Diagnosis not present

## 2023-08-08 NOTE — Therapy (Unsigned)
OUTPATIENT PHYSICAL THERAPY NEURO TREATMENT NOTE   Patient Name: [name] MRN: [id number] DOB:11-29-1936, 87 y.o., male Today's Date: 08/09/2023  END OF SESSION:  PT End of Session - 08/09/23 1936     Visit Number 2    Number of Visits 2    Date for PT Re-Evaluation 09/12/23    Authorization Type [employer] MCR    PT Start Time 1017    PT Stop Time 1100    PT Time Calculation (min) 43 min    Equipment Utilized During Treatment Gait belt    Activity Tolerance Patient tolerated treatment well    Behavior During Therapy WFL for tasks assessed/performed              Past Medical History:  Diagnosis Date   Abnormal nuclear cardiac imaging test March 2011   Has positive EKG response, no perfusion defect and normal EF   Adenomatous colon polyp 12/2002   Arrhythmia    Atrial fibrillation (HCC)    a. s/p rfca;  b. chronic tikosyn and xarelto.   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 04/2017 Echo: EF 65-70%, restrictive physiology, mildly dil LA.   COPD (chronic obstructive pulmonary disease) (HCC)    Coronary artery disease    First MI in 1988. PCI in 1996 with subsequent CABG in 1996 due to restenosis. S/P stents to LCX in 2009. Noted to have residual disease in the LAD in a diffuse manner and atretic LIMA graft. He is managed medically.    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   History of Raynaud's syndrome    Hyperlipidemia    Hypertension    Internal hemorrhoids    Muscle spasm    Myocardial infarction (HCC)    MI x1 1989 - 75 age   Neuromuscular disorder (HCC)    Polio - age 35   OSA (obstructive sleep apnea)  10/16/2018   Severe OSA with AHI 37.2/hr on BiPAP   Persistent atrial fibrillation (HCC)    Polio    age 89   PVC (premature ventricular contraction)    Scoliosis    mild   Past Surgical History:  Procedure Laterality Date   ABLATION     done for a fib   APPENDECTOMY     Age 61   ATRIAL FIBRILLATION ABLATION N/A 06/08/2019   Procedure: ATRIAL FIBRILLATION ABLATION;  Surgeon: Regan Lemming, MD;  Location: [facility] INVASIVE CV LAB;  Service: Cardiovascular;  Laterality: N/A;   ATRIAL FIBRILLATION ABLATION N/A 11/19/2020   Procedure: ATRIAL FIBRILLATION ABLATION;  Surgeon: Regan Lemming, MD;  Location: [facility] INVASIVE CV LAB;  Service: Cardiovascular;  Laterality: N/A;   CARDIAC CATHETERIZATION     CARDIOVERSION N/A 08/22/2013   Procedure: CARDIOVERSION;  Surgeon: Luis Abed, MD;  Location: [facility] ENDOSCOPY;  Service: Cardiovascular;  Laterality: N/A;   CARDIOVERSION N/A 01/16/2014   Procedure: CARDIOVERSION;  Surgeon: Lewayne Bunting, MD;  Location: [facility] ENDOSCOPY;  Service: Cardiovascular;  Laterality: N/A;   CARDIOVERSION N/A 12/11/2018   Procedure: CARDIOVERSION;  Surgeon: Laurey Morale, MD;  Location: [facility] ENDOSCOPY;  Service: Cardiovascular;  Laterality: N/A;   CARDIOVERSION N/A 11/20/2019   Procedure: CARDIOVERSION;  Surgeon: Little Ishikawa, MD;  Location: [facility] ENDOSCOPY;  Service: Endoscopy;  Laterality: N/A;   CARDIOVERSION N/A 07/10/2020   Procedure: CARDIOVERSION;  Surgeon: Laurey Morale, MD;  Location: [facility] ENDOSCOPY;  Service: Cardiovascular;  Laterality: N/A;   CARDIOVERSION N/A 01/21/2021   Procedure: CARDIOVERSION;  Surgeon: Laurey Morale, MD;  Location: [facility] ENDOSCOPY;  Service: Cardiovascular;  Laterality: N/A;   COLONOSCOPY  2008   CORONARY ARTERY BYPASS GRAFT  1996   with a LIMA to the LAD, SVG to RCA and SVG to OM   CORONARY STENT INTERVENTION N/A 03/26/2019   Procedure: CORONARY STENT INTERVENTION;  Surgeon: Corky Crafts, MD;  Location: [facility] INVASIVE  CV LAB;  Service: Cardiovascular;  Laterality: N/A;   CORONARY STENT PLACEMENT  1996   LCX   EMBOLECTOMY Right 05/09/2019   Procedure: Embolectomy of right radial artery;  Surgeon: Nada Libman, MD;  Location: [facility] OR;  Service: Vascular;  Laterality: Right;   ESOPHAGEAL DILATION     EYE SURGERY     FALSE ANEURYSM REPAIR Right 05/09/2019   Procedure: REPAIR FALSE ANEURYSM RIGHT RADIAL;  Surgeon: Nada Libman, MD;  Location: [facility] OR;  Service: Vascular;  Laterality: Right;   LEFT HEART CATH AND CORS/GRAFTS ANGIOGRAPHY N/A 03/26/2019   Procedure: LEFT HEART CATH AND CORS/GRAFTS ANGIOGRAPHY;  Surgeon: Laurey Morale, MD;  Location: [facility] INVASIVE CV LAB;  Service: Cardiovascular;  Laterality: N/A;   POLYPECTOMY     TONSILLECTOMY     UPPER GASTROINTESTINAL ENDOSCOPY     esophegeal dilation   Patient Active Problem List   Diagnosis Date Noted   Atrial flutter (HCC) 11/13/2019   Secondary hypercoagulable state (HCC) 11/13/2019   CVA (cerebral vascular accident) (HCC) 10/20/2019   Bleeding gums 10/20/2019   Unstable angina (HCC) 03/25/2019   OSA (obstructive sleep apnea) 10/16/2018   Chronic diastolic (congestive) heart failure (HCC) 05/20/2017   Allergic rhinitis 11/17/2015   Acute on chronic diastolic CHF (congestive heart failure), NYHA class 4 (HCC) 01/28/2015   Chest pain on exertion 11/14/2014   Cerebral artery occlusion 06/24/2014   Acid reflux 06/24/2014   H/O cardiovascular disorder 06/24/2014   H/O acute poliomyelitis 06/24/2014   Extrasystole 06/24/2014   A-fib (HCC) 06/24/2014   Cerebral artery occlusion with cerebral infarction (HCC) 06/24/2014   COPD (chronic obstructive pulmonary disease) (HCC) 05/02/2014   CAFL (chronic airflow limitation) (HCC) 05/02/2014   Atrial fibrillation (HCC) 11/11/2013   CAD (coronary artery disease) 09/01/2011   Hyperlipidemia 09/01/2011   Atherosclerosis of coronary artery 09/01/2011   History of Raynaud's syndrome    History of  post-polio syndrome    GERD (gastroesophageal reflux disease)    PVC (premature ventricular contraction)    Brainstem stroke (HCC)    Scoliosis     PCP: Geoffry Paradise, MD   REFERRING PROVIDER: Laurey Morale, MD  REFERRING DIAG: Imbalance/falls needs balance training.  THERAPY DIAG:  Other abnormalities of gait and mobility  Rationale for Evaluation and Treatment: Rehabilitation  ONSET DATE: chronic  SUBJECTIVE:   SUBJECTIVE STATEMENT: Pt referred to this Neuro clinic from [facility], PT, for further assessment of balance and vertigo/to assess for BPPV.  Pt reports he gets light-headed with sit to stand and if he turns quickly he loses his balance; has been going on for about 6-8 months.  Pt denies room spinning vertigo at this time  PERTINENT HISTORY: Imbalance: Very limiting.  Does not seem orthostatic, does seem like a balance issue. Worried he will fall.  - Refer to PT for balance training.  PAIN:  Are you having pain? No  PRECAUTIONS: Other: CHF  RED FLAGS: None   WEIGHT BEARING RESTRICTIONS: No  FALLS:  Has patient fallen in last 6 months? Yes. Number of falls 2  - most recent fall was about a month ago - prior to PT eval with Learta Codding  OCCUPATION: retired  PLOF: Independent  PATIENT GOALS: To get more steady on my feet  NEXT MD VISIT: as needed  OBJECTIVE:   DIAGNOSTIC FINDINGS: none  PATIENT SURVEYS:  FOTO 57(60 predicted)  MUSCLE LENGTH: deferred  POSTURE: No Significant postural limitations  PALPATION: deferred  LOWER EXTREMITY ROM: WNL throughout  Active ROM Right eval Left eval  Hip flexion    Hip extension    Hip abduction    Hip adduction    Hip internal rotation    Hip external rotation    Knee flexion    Knee extension    Ankle dorsiflexion    Ankle plantarflexion    Ankle inversion    Ankle eversion     (Blank rows = not tested)  LOWER EXTREMITY MMT: WFL throughout  MMT Right eval Left eval  Hip  flexion    Hip extension    Hip abduction    Hip adduction    Hip internal rotation    Hip external rotation    Knee flexion    Knee extension    Ankle dorsiflexion    Ankle plantarflexion 5 5  Ankle inversion    Ankle eversion     (Blank rows = not tested)  LOWER EXTREMITY SPECIAL TESTS:  deferred  FUNCTIONAL TESTS:  5 times sit to stand: 12s arms crossed mCTSIB Able to hold all 4 positions for 30s   07/18/23 0001  Dynamic Gait Index  Level Surface 3  Change in Gait Speed 3  Gait with Horizontal Head Turns 2  Gait with Vertical Head Turns 3  Gait and Pivot Turn 3  Step Over Obstacle 3  Step Around Obstacles 3  Steps 3  Total Score 23     GAIT: Distance walked: 66ftx2 Assistive device utilized: None Level of assistance: Complete Independence Comments: unremarkable   TODAY'S TREATMENT:                                                                                                                              DATE: 08-08-23  McTSIB;  condition 1 - 30 secs; condition 2 - 30 secs; condition 3 - 30 secs with mild postural sway;  condition 4 - mild to moderate postural sway   GAIT:     08/09/23 0001  Functional Gait  Assessment  Gait Level Surface 2 (6.47)  Change in Gait Speed 3  Gait with Horizontal Head Turns 2  Gait with Vertical Head Turns 3  Gait and Pivot Turn 3  Step Over Obstacle 3  Gait with Narrow Base of Support 2  Gait with Eyes Closed 1  Ambulating Backwards 3  Steps 2  Total Score 24    PATIENT EDUCATION:  Education details: discussed findings of decreased vestibular input in maintaining balance - no signs of BPPV at this time  Person educated: Patient Education method: Explanation Education comprehension: verbalized understanding and needs further education  HOME EXERCISE PROGRAM: Balance on foam - 4 positions - feet apart/together and EO and EC  ASSESSMENT:  CLINICAL IMPRESSION: Patient demonstrates decreased vestibular input in  maintaining balance at this time as evidenced by moderate postural sway with standing on foam with EC.  FGA score = 24/30 - no significant fall risk with gait as score >22/30.  Pt did have unsteadiness upon initial sit to stand but was able to recover independently.  No signs of BPPV present - pt denies having vertigo, but does report imbalance.     OBJECTIVE IMPAIRMENTS: Abnormal gait, difficulty walking, and dizziness.   ACTIVITY LIMITATIONS: bending, locomotion level, and rapid direction change  PERSONAL FACTORS: Age, Fitness, and Time since onset of injury/illness/exacerbation are also affecting patient's functional outcome.   REHAB POTENTIAL: Good  CLINICAL DECISION MAKING: Stable/uncomplicated  EVALUATION COMPLEXITY: Low   GOALS: Goals reviewed with patient? No  SHORT TERM GOALS: Target date: 08/01/2023   Patient to demonstrate independence in HEP  Baseline: TBD; HEP issued on 08-08-23 - pt verbalized understanding of HEP Goal status: Deferred as pt did not feel additional PT appts were needed at this time  2.  Assess for BPPV dysfunction Baseline: TBD Goal status: Goal met 08-08-23   LONG TERM GOALS: Target date: 09/12/2023  Increase FOTO score to 60 Baseline: 57 Goal status: Deferred due to no follow up needed     PLAN:  PT FREQUENCY: 1-2x/week  PT DURATION: 6 weeks  PLANNED INTERVENTIONS: Therapeutic exercises, Therapeutic activity, Neuromuscular re-education, Balance training, Gait training, Patient/Family education, Self Care, Joint mobilization, Stair training, DME instructions, Dry Needling, Spinal mobilization, Cryotherapy, Moist heat, Manual therapy, and Re-evaluation  PLAN FOR NEXT SESSION: D/C per pt's request - no BPPV   , Donavan Burnet, PT 08/09/2023, 7:41 PM

## 2023-08-08 NOTE — Patient Instructions (Signed)
   Tandem Stance    Right foot in front of left, heel touching toe both feet "straight ahead". Stand on Foot Triangle of Support with both feet. Balance in this position _30__ seconds. Do with left foot in front of right.    SINGLE LIMB STANCE    Stance: single leg on floor. Raise leg. Hold _10__ seconds. Repeat with other leg. __2_ reps per set, __1-2_ sets per day, __5_ days per week  Copyright  [employer]. All rights reserved.

## 2023-08-09 ENCOUNTER — Encounter: Payer: Self-pay | Admitting: Physical Therapy

## 2023-08-09 NOTE — Progress Notes (Signed)
   08/09/23 0001  Functional Gait  Assessment  Gait Level Surface 2 (6.47)  Change in Gait Speed 3  Gait with Horizontal Head Turns 2  Gait with Vertical Head Turns 3  Gait and Pivot Turn 3  Step Over Obstacle 3  Gait with Narrow Base of Support 2  Gait with Eyes Closed 1  Ambulating Backwards 3  Steps 2  Total Score 24

## 2023-09-19 ENCOUNTER — Encounter: Payer: Self-pay | Admitting: *Deleted

## 2023-09-19 ENCOUNTER — Encounter: Payer: Self-pay | Admitting: Cardiology

## 2023-09-19 ENCOUNTER — Ambulatory Visit: Payer: Medicare Other | Attending: Cardiology | Admitting: Cardiology

## 2023-09-19 VITALS — BP 130/76 | HR 80 | Ht 66.0 in | Wt 112.0 lb

## 2023-09-19 DIAGNOSIS — I484 Atypical atrial flutter: Secondary | ICD-10-CM

## 2023-09-19 DIAGNOSIS — G4733 Obstructive sleep apnea (adult) (pediatric): Secondary | ICD-10-CM

## 2023-09-19 DIAGNOSIS — D6869 Other thrombophilia: Secondary | ICD-10-CM | POA: Diagnosis not present

## 2023-09-19 DIAGNOSIS — I10 Essential (primary) hypertension: Secondary | ICD-10-CM | POA: Diagnosis not present

## 2023-09-19 DIAGNOSIS — I4819 Other persistent atrial fibrillation: Secondary | ICD-10-CM | POA: Diagnosis not present

## 2023-09-19 DIAGNOSIS — I2510 Atherosclerotic heart disease of native coronary artery without angina pectoris: Secondary | ICD-10-CM

## 2023-09-19 NOTE — Progress Notes (Signed)
Electrophysiology Office Note:   Date:  09/19/2023  ID:  [name], DOB 1936/06/11, MRN [id number]  Primary Cardiologist: None Electrophysiologist: Myliah Medel Jorja Loa, MD      History of Present Illness:   [name] is a 87 y.o. male with h/o atrial fibrillation, coronary artery disease, COPD, CVA, sleep apnea, hypertension, hyperlipidemia seen today for routine electrophysiology followup.   Since last being seen in our clinic the patient reports fatigue, shortness of breath.  He has been feeling this way for the last few months.  When he last saw his general cardiologist, he complained of the same symptoms, though they have worsened over the last few months.  Per his wife, he is not exercising at the moment.  he denies chest pain, palpitations, dyspnea, PND, orthopnea, nausea, vomiting, dizziness, syncope, edema, weight gain, or early satiety.   Review of systems complete and found to be negative unless listed in HPI.   EP Information / Studies Reviewed:    EKG is not ordered today. EKG from 06/23/23 reviewed which showed atrial flutter      Risk Assessment/Calculations:    CHA2DS2-VASc Score = 7   This indicates a 11.2% annual risk of stroke. The patient's score is based upon: CHF History: 0 HTN History: 1 Diabetes History: 1 Stroke History: 2 Vascular Disease History: 1 Age Score: 2 Gender Score: 0             Physical Exam:   VS:  BP 130/76 (BP Location: Left Arm, Patient Position: Sitting, Cuff Size: Normal)   Pulse 80   Ht 5\' 6"  (1.676 m)   Wt 112 lb (50.8 kg)   SpO2 95%   BMI 18.08 kg/m    Wt Readings from Last 3 Encounters:  09/19/23 112 lb (50.8 kg)  06/23/23 113 lb 12.8 oz (51.6 kg)  01/13/23 116 lb (52.6 kg)     GEN: Well nourished, well developed in no acute distress NECK: No JVD; No carotid bruits CARDIAC: Irregularly irregular rate and rhythm, no murmurs, rubs, gallops RESPIRATORY:  Clear to auscultation without rales, wheezing or rhonchi   ABDOMEN: Soft, non-tender, non-distended EXTREMITIES:  No edema; No deformity   ASSESSMENT AND PLAN:    1.  Persistent atrial fibrillation/atypical atrial flutter: Currently on dofetilide.  Had multiple atrial flutter circuits at the time of ablation.  Ablation was 11/19/20.  He is unfortunately back in atrial flutter.  He is feeling poorly with shortness of breath and fatigue.  Due to that, we [name] plan for cardioversion.  2.  Obstructive sleep apnea: Unable to tolerate CPAP or oral appliance  3.  Coronary artery disease: Post CABG.  No current chest pain.  4.  Hypertension: Currently well-controlled  5.  Second hypercoagulable state: Currently on Eliquis for atrial fibrillation  6.  High risk medication monitoring: Currently on dofetilide.  Recent labs within normal limits  Follow up with Afib Clinic in 2 weeks  Signed, Yvonne Stopher Jorja Loa, MD

## 2023-09-19 NOTE — Patient Instructions (Signed)
Medication Instructions:  Your physician recommends that you continue on your current medications as directed. Please refer to the Current Medication list given to you today.  *If you need a refill on your cardiac medications before your next appointment, please call your pharmacy*   Lab Work: None ordered If you have labs (blood work) drawn today and your tests are completely normal, you will receive your results only by: MyChart Message (if you have MyChart) OR A paper copy in the mail If you have any lab test that is abnormal or we need to change your treatment, we will call you to review the results.   Testing/Procedures: Your physician has recommended that you have a Cardioversion (DCCV). Electrical Cardioversion uses a jolt of electricity to your heart either through paddles or wired patches attached to your chest. This is a controlled, usually prescheduled, procedure. Defibrillation is done under light anesthesia in the hospital, and you usually go home the day of the procedure. This is done to get your heart back into a normal rhythm. You are not awake for the procedure. Please see the instruction sheet given to you today.   Follow-Up: At [facility], you and your health needs are our priority.  As part of our continuing mission to provide you with exceptional heart care, we have created designated Provider Care Teams.  These Care Teams include your primary Cardiologist (physician) and Advanced Practice Providers (APPs -  Physician Assistants and Nurse Practitioners) who all work together to provide you with the care you need, when you need it.  Your next appointment:   1 month(s)  The format for your next appointment:   In Person  Provider:   You will follow up in the Atrial Fibrillation Clinic located at [facility]. Your provider will be: Clint R. Fenton, PA-C  Or Landry Mellow, PA-C   Thank you for choosing [facility]!!   Dory Horn, RN [phone]

## 2023-09-19 NOTE — H&P (View-Only) (Signed)
Electrophysiology Office Note:   Date:  09/19/2023  ID:  [name], DOB 1936/06/11, MRN [id number]  Primary Cardiologist: None Electrophysiologist: Myliah Medel Jorja Loa, MD      History of Present Illness:   [name] is a 87 y.o. male with h/o atrial fibrillation, coronary artery disease, COPD, CVA, sleep apnea, hypertension, hyperlipidemia seen today for routine electrophysiology followup.   Since last being seen in our clinic the patient reports fatigue, shortness of breath.  He has been feeling this way for the last few months.  When he last saw his general cardiologist, he complained of the same symptoms, though they have worsened over the last few months.  Per his wife, he is not exercising at the moment.  he denies chest pain, palpitations, dyspnea, PND, orthopnea, nausea, vomiting, dizziness, syncope, edema, weight gain, or early satiety.   Review of systems complete and found to be negative unless listed in HPI.   EP Information / Studies Reviewed:    EKG is not ordered today. EKG from 06/23/23 reviewed which showed atrial flutter      Risk Assessment/Calculations:    CHA2DS2-VASc Score = 7   This indicates a 11.2% annual risk of stroke. The patient's score is based upon: CHF History: 0 HTN History: 1 Diabetes History: 1 Stroke History: 2 Vascular Disease History: 1 Age Score: 2 Gender Score: 0             Physical Exam:   VS:  BP 130/76 (BP Location: Left Arm, Patient Position: Sitting, Cuff Size: Normal)   Pulse 80   Ht 5\' 6"  (1.676 m)   Wt 112 lb (50.8 kg)   SpO2 95%   BMI 18.08 kg/m    Wt Readings from Last 3 Encounters:  09/19/23 112 lb (50.8 kg)  06/23/23 113 lb 12.8 oz (51.6 kg)  01/13/23 116 lb (52.6 kg)     GEN: Well nourished, well developed in no acute distress NECK: No JVD; No carotid bruits CARDIAC: Irregularly irregular rate and rhythm, no murmurs, rubs, gallops RESPIRATORY:  Clear to auscultation without rales, wheezing or rhonchi   ABDOMEN: Soft, non-tender, non-distended EXTREMITIES:  No edema; No deformity   ASSESSMENT AND PLAN:    1.  Persistent atrial fibrillation/atypical atrial flutter: Currently on dofetilide.  Had multiple atrial flutter circuits at the time of ablation.  Ablation was 11/19/20.  He is unfortunately back in atrial flutter.  He is feeling poorly with shortness of breath and fatigue.  Due to that, we Johnni Wunschel plan for cardioversion.  2.  Obstructive sleep apnea: Unable to tolerate CPAP or oral appliance  3.  Coronary artery disease: Post CABG.  No current chest pain.  4.  Hypertension: Currently well-controlled  5.  Second hypercoagulable state: Currently on Eliquis for atrial fibrillation  6.  High risk medication monitoring: Currently on dofetilide.  Recent labs within normal limits  Follow up with Afib Clinic in 2 weeks  Signed, Yvonne Stopher Jorja Loa, MD

## 2023-09-29 NOTE — Progress Notes (Signed)
Spoke with patient's wife, Procedure scheduled for 1045, Please arrive at the hospital at  0930, NPO after midnight on           Thursday, May take meds with sips of water in the AM, please have transportation for home post procedure, and someone to stay with pt for approximately 24 hours after  Wife states pt hasn't missed any doses of eliquis and has been taking regularly  Phone number given if pt has any questions, [phone]

## 2023-09-30 ENCOUNTER — Other Ambulatory Visit: Payer: Self-pay

## 2023-09-30 ENCOUNTER — Ambulatory Visit (HOSPITAL_COMMUNITY): Payer: Medicare Other | Admitting: Anesthesiology

## 2023-09-30 ENCOUNTER — Encounter (HOSPITAL_COMMUNITY)
Admission: RE | Disposition: A | Discharge status: Home / Self Care | Payer: Self-pay | Source: Home / Self Care | Attending: Cardiology

## 2023-09-30 ENCOUNTER — Ambulatory Visit (HOSPITAL_COMMUNITY)
Admission: RE | Admit: 2023-09-30 | Discharge: 2023-09-30 | Disposition: A | Discharge status: Home / Self Care | Payer: Medicare Other | Attending: Cardiology | Admitting: Cardiology

## 2023-09-30 DIAGNOSIS — Z79899 Other long term (current) drug therapy: Secondary | ICD-10-CM | POA: Diagnosis not present

## 2023-09-30 DIAGNOSIS — G4733 Obstructive sleep apnea (adult) (pediatric): Secondary | ICD-10-CM | POA: Insufficient documentation

## 2023-09-30 DIAGNOSIS — I4891 Unspecified atrial fibrillation: Secondary | ICD-10-CM | POA: Diagnosis not present

## 2023-09-30 DIAGNOSIS — I484 Atypical atrial flutter: Secondary | ICD-10-CM | POA: Insufficient documentation

## 2023-09-30 DIAGNOSIS — I10 Essential (primary) hypertension: Secondary | ICD-10-CM | POA: Diagnosis not present

## 2023-09-30 DIAGNOSIS — J449 Chronic obstructive pulmonary disease, unspecified: Secondary | ICD-10-CM | POA: Insufficient documentation

## 2023-09-30 DIAGNOSIS — I2510 Atherosclerotic heart disease of native coronary artery without angina pectoris: Secondary | ICD-10-CM | POA: Insufficient documentation

## 2023-09-30 DIAGNOSIS — Z7901 Long term (current) use of anticoagulants: Secondary | ICD-10-CM | POA: Diagnosis not present

## 2023-09-30 DIAGNOSIS — Z8673 Personal history of transient ischemic attack (TIA), and cerebral infarction without residual deficits: Secondary | ICD-10-CM | POA: Insufficient documentation

## 2023-09-30 DIAGNOSIS — E785 Hyperlipidemia, unspecified: Secondary | ICD-10-CM | POA: Insufficient documentation

## 2023-09-30 DIAGNOSIS — I4892 Unspecified atrial flutter: Secondary | ICD-10-CM

## 2023-09-30 DIAGNOSIS — D6859 Other primary thrombophilia: Secondary | ICD-10-CM | POA: Insufficient documentation

## 2023-09-30 DIAGNOSIS — I4819 Other persistent atrial fibrillation: Secondary | ICD-10-CM | POA: Diagnosis present

## 2023-09-30 HISTORY — PX: CARDIOVERSION: SHX1299

## 2023-09-30 LAB — POCT I-STAT, CHEM 8
BUN: 17 mg/dL (ref 8–23)
Calcium, Ion: 1.37 mmol/L (ref 1.15–1.40)
Chloride: 101 mmol/L (ref 98–111)
Creatinine, Ser: 1.2 mg/dL (ref 0.61–1.24)
Glucose, Bld: 93 mg/dL (ref 70–99)
HCT: 50 % (ref 39.0–52.0)
Hemoglobin: 17 g/dL (ref 13.0–17.0)
Potassium: 4.2 mmol/L (ref 3.5–5.1)
Sodium: 140 mmol/L (ref 135–145)
TCO2: 28 mmol/L (ref 22–32)

## 2023-09-30 SURGERY — CARDIOVERSION
Anesthesia: General

## 2023-09-30 MED ORDER — PROPOFOL 10 MG/ML IV BOLUS
INTRAVENOUS | Status: DC | PRN
  Administered 2023-09-30: 50 mg via INTRAVENOUS
  Administered 2023-09-30: 10 mg via INTRAVENOUS

## 2023-09-30 MED ORDER — SODIUM CHLORIDE 0.9 % IV SOLN
INTRAVENOUS | Status: DC
  Administered 2023-09-30: 20 mL/h via INTRAVENOUS

## 2023-09-30 SURGICAL SUPPLY — 1 items: PAD DEFIB RADIO PHYSIO CONN (PAD) ×1 IMPLANT

## 2023-09-30 NOTE — Interval H&P Note (Signed)
History and Physical Interval Note:  09/30/2023 10:21 AM  [name]  has presented today for surgery, with the diagnosis of AFIB.  The various methods of treatment have been discussed with the patient and family. After consideration of risks, benefits and other options for treatment, the patient has consented to  Procedure(s): CARDIOVERSION (N/A) as a surgical intervention.  The patient's history has been reviewed, patient examined, no change in status, stable for surgery.  I have reviewed the patient's chart and labs.  Questions were answered to the patient's satisfaction.    Has been taking Eliquis regularly.   Labs from 09/30/2023 reviewed.   Risks, benefits, and alternatives of direct current cardioversion reviewed with the and patient. Risk includes but not limited to: potential for post-cardioversion rhythms, life-threatening arrhythmias (ventricular tachycardia and fibrillation, profound bradycardia, cardiac arrest), myocardial damage, acute pulmonary edema, skin burns, transient hypotension. Benefits include restoration of sinus rhythm. Alternatives to treatment were discussed, questions were answered, patient voices understanding and provides verbal feedback.  Patient is willing to proceed.   Tessa Lerner, DO, [facility]  [facility]  [address] [place], [place] [address] [phone]

## 2023-09-30 NOTE — Anesthesia Preprocedure Evaluation (Addendum)
Anesthesia Evaluation  Patient identified by MRN, date of birth, ID band Patient awake    Reviewed: Allergy & Precautions, Patient's Chart, lab work & pertinent test results  History of Anesthesia Complications (+) DIFFICULT AIRWAY and history of anesthetic complications  Airway Mallampati: II  TM Distance: <3 FB Neck ROM: Full    Dental  (+) Teeth Intact, Dental Advisory Given   Pulmonary sleep apnea , COPD,  COPD inhaler, former smoker   Pulmonary exam normal breath sounds clear to auscultation       Cardiovascular hypertension, Pt. on medications pulmonary hypertension(-) angina + CAD, + Past MI, + Cardiac Stents, + CABG and +CHF  + dysrhythmias Atrial Fibrillation + Valvular Problems/Murmurs MR  Rhythm:Irregular Rate:Abnormal  Echo 07/2022  1. Left ventricular ejection fraction, by estimation, is 55 to 60%. The left ventricle has normal function. The left ventricle has no regional wall motion abnormalities. Left ventricular diastolic parameters are consistent with Grade II diastolic dysfunction (pseudonormalization).   2. Right ventricular systolic function is mildly reduced. The right ventricular size is mildly enlarged. There is mildly elevated pulmonary artery systolic pressure. The estimated right ventricular systolic pressure is 41.4 mmHg.   3. Left atrial size was moderately dilated.   4. Right atrial size was mildly dilated.   5. The mitral valve is degenerative. Mild to moderate mitral valve regurgitation. No evidence of mitral stenosis. Moderate to severe mitral annular calcification.   6. The aortic valve is tricuspid. There is moderate calcification of the aortic valve. Aortic valve regurgitation is not visualized. Aortic valve sclerosis is present, with no evidence of aortic valve stenosis.   7. The inferior vena cava is normal in size with <50% respiratory variability, suggesting right atrial pressure of 8 mmHg.   8.  There appears to be a PFO by color doppler.      Neuro/Psych CVA, Residual Symptoms    GI/Hepatic Neg liver ROS,GERD  Medicated,,  Endo/Other  negative endocrine ROS    Renal/GU negative Renal ROS     Musculoskeletal negative musculoskeletal ROS (+)    Abdominal   Peds  Hematology  (+) Blood dyscrasia (Eliquis)   Anesthesia Other Findings Day of surgery medications reviewed with the patient.  Reproductive/Obstetrics                             Anesthesia Physical Anesthesia Plan  ASA: 4  Anesthesia Plan: General   Post-op Pain Management: Minimal or no pain anticipated   Induction: Intravenous  PONV Risk Score and Plan: 2 and Propofol infusion, Treatment may vary due to age or medical condition and TIVA  Airway Management Planned: Mask  Additional Equipment:   Intra-op Plan:   Post-operative Plan:   Informed Consent: I have reviewed the patients History and Physical, chart, labs and discussed the procedure including the risks, benefits and alternatives for the proposed anesthesia with the patient or authorized representative who has indicated his/her understanding and acceptance.     Dental advisory given  Plan Discussed with: CRNA  Anesthesia Plan Comments:         Anesthesia Quick Evaluation

## 2023-09-30 NOTE — Transfer of Care (Signed)
Immediate Anesthesia Transfer of Care Note  Patient: [name]  Procedure(s) Performed: CARDIOVERSION  Patient Location: Cath Lab  Anesthesia Type:General  Level of Consciousness: awake, alert , and oriented  Airway & Oxygen Therapy: Patient Spontanous Breathing  Post-op Assessment: Report given to RN  Post vital signs: Reviewed and stable  Last Vitals:  Vitals Value Taken Time  BP    Temp    Pulse    Resp    SpO2      Last Pain:  Vitals:   09/30/23 0932  TempSrc: Temporal         Complications: No notable events documented.

## 2023-09-30 NOTE — Discharge Instructions (Signed)
 Electrical Cardioversion Electrical cardioversion is the delivery of a jolt of electricity to restore a normal rhythm to the heart. A rhythm that is too fast or is not regular (arrhythmia) keeps the heart from pumping blood well. There is also another type of cardioversion called a chemical (pharmacologic) cardioversion. This is when your health care provider gives you one or more medicines to bring back your regular heart rhythm. Electrical cardioversion is done as a scheduled procedure for arrhythmiasthat are not life-threatening. Electrical cardioversion may also be done in an emergency for sudden life-threatening arrhythmias. Tell a health care provider about: Any allergies you have. All medicines you are taking, including vitamins, herbs, eye drops, creams, and over-the-counter medicines. Any problems you or family members have had with sedatives or anesthesia. Any bleeding problems you have. Any surgeries you have had, including a pacemaker, defibrillator, or other implanted device. Any medical conditions you have. Whether you are pregnant or may be pregnant. What are the risks? Your provider will talk with you about risks. These include: Allergic reactions to medicines. Irritation to the skin on your chest or back where the sticky pads (electrodes) or paddles were put during electrical cardioversion. A blood clot that breaks free and travels to other parts of your body, such as your brain. Return of a worse abnormal heart rhythm that will need to be treated with medicines, a pacemaker, or an implantable cardioverter defibrillator (ICD). What happens before the procedure? Medicines Your provider may give you: Blood-thinning medicines (anticoagulants) so your blood does not clot as easily. If your provider gives you this medicine, you may need to take it for 4 weeks before the procedure. Medicines to help stabilize your heart rate and rhythm. Ask your provider about: Changing or stopping  your regular medicines. These include any diabetes medicines or blood thinners you take. Taking medicines such as aspirin and ibuprofen. These medicines can thin your blood. Do not take them unless your provider tells you to. Taking over-the-counter medicines, vitamins, herbs, and supplements. General instructions Follow instructions from your provider about what you may eat and drink. Do not put any lotions, powders, or ointments on your chest and back for 24 hours before the procedure. They can cause problems with the electrodes or paddles used to deliver electricity to your heart. Do not wear jewelry as this can interfere with delivering electricity to your heart. If you will be going home right after the procedure, plan to have a responsible adult: Take you home from the hospital or clinic. You will not be allowed to drive. Care for you for the time you are told. Tests You may have an exam or testing. This may include: Blood labs. A transesophageal echocardiogram (TEE). What happens during the procedure?     An IV will be inserted into one of your veins. You will be given a sedative. This helps you relax. Electrodes or metal paddles will be placed on your chest. They may be placed in one of these ways: One placed on your right chest, the other on the left ribs. One placed on your chest and the other on your back. An electrical shock will be delivered. The shock briefly stops (resets) your heart rhythm. Your provider will check to see if your heart rhythm is now normal. Some people need only one shock. Some need more to restore a normal heart rhythm. The procedure may vary among providers and hospitals. What happens after the procedure? Your blood pressure, heart rate, breathing rate, and blood oxygen  level will be monitored until you leave the hospital or clinic. Your heart rhythm will be watched to make sure it does not change. This information is not intended to replace advice  given to you by your health care provider. Make sure you discuss any questions you have with your health care provider. Document Revised: 08/05/2022 Document Reviewed: 08/05/2022 Elsevier Patient Education  2024 [employer].

## 2023-09-30 NOTE — Anesthesia Postprocedure Evaluation (Signed)
Anesthesia Post Note  Patient: [name]  Procedure(s) Performed: CARDIOVERSION     Patient location during evaluation: PACU Anesthesia Type: General Level of consciousness: sedated and patient cooperative Pain management: pain level controlled Vital Signs Assessment: post-procedure vital signs reviewed and stable Respiratory status: spontaneous breathing Cardiovascular status: stable Anesthetic complications: no   No notable events documented.  Last Vitals:  Vitals:   09/30/23 1140 09/30/23 1145  BP: 112/64 124/62  Pulse: 60 (!) 58  Resp: 15 14  Temp:  (!) 36.2 C  SpO2: 96% 96%    Last Pain:  Vitals:   09/30/23 1145  TempSrc: Temporal  PainSc:                  [name]

## 2023-09-30 NOTE — CV Procedure (Addendum)
DIRECT CURRENT CARDIOVERSION  NAME:  [name]    MRN: [id number] DOB:  [birth year]    ADMIT DATE: 09/30/2023  Indication:  Symptomatic atypical atrial flutter  Procedure Note:  The patient signed informed consent.  They have had had therapeutic anticoagulation with Eliquis greater than 3 weeks.  Anesthesia was administered by Dr. Renold Don.  Adequate airway was maintained throughout and vital followed per protocol.  They were cardioverted x 1 with 100J of biphasic synchronized energy.  He converted to NSR.  There were no apparent complications.  The patient had normal neuro status and respiratory status post procedure with vitals stable as recorded elsewhere.    Follow up: They will continue on current medical therapy and follow up with cardiology as scheduled.  Update: Tried calling his wife several times but goes to voicemail. Finally was able to update his wife her questions answered.   Tessa Lerner, DO, [facility] [place]  [facility]  [address] [place], [place] [address] [phone] 11:38 AM

## 2023-10-03 ENCOUNTER — Encounter (HOSPITAL_COMMUNITY): Payer: Self-pay | Admitting: Cardiology

## 2023-10-19 ENCOUNTER — Ambulatory Visit (HOSPITAL_COMMUNITY)
Admission: RE | Admit: 2023-10-19 | Discharge: 2023-10-19 | Disposition: A | Discharge status: Home / Self Care | Payer: Medicare Other | Source: Ambulatory Visit | Attending: Internal Medicine | Admitting: Internal Medicine

## 2023-10-19 VITALS — BP 140/70 | HR 92 | Ht 66.0 in | Wt 114.2 lb

## 2023-10-19 DIAGNOSIS — I255 Ischemic cardiomyopathy: Secondary | ICD-10-CM | POA: Insufficient documentation

## 2023-10-19 DIAGNOSIS — Z8673 Personal history of transient ischemic attack (TIA), and cerebral infarction without residual deficits: Secondary | ICD-10-CM | POA: Insufficient documentation

## 2023-10-19 DIAGNOSIS — Z79899 Other long term (current) drug therapy: Secondary | ICD-10-CM | POA: Diagnosis not present

## 2023-10-19 DIAGNOSIS — E785 Hyperlipidemia, unspecified: Secondary | ICD-10-CM | POA: Diagnosis not present

## 2023-10-19 DIAGNOSIS — I5032 Chronic diastolic (congestive) heart failure: Secondary | ICD-10-CM | POA: Insufficient documentation

## 2023-10-19 DIAGNOSIS — Z955 Presence of coronary angioplasty implant and graft: Secondary | ICD-10-CM | POA: Insufficient documentation

## 2023-10-19 DIAGNOSIS — G4733 Obstructive sleep apnea (adult) (pediatric): Secondary | ICD-10-CM | POA: Insufficient documentation

## 2023-10-19 DIAGNOSIS — I4819 Other persistent atrial fibrillation: Secondary | ICD-10-CM | POA: Diagnosis not present

## 2023-10-19 DIAGNOSIS — I2510 Atherosclerotic heart disease of native coronary artery without angina pectoris: Secondary | ICD-10-CM | POA: Diagnosis not present

## 2023-10-19 DIAGNOSIS — I110 Hypertensive heart disease with heart failure: Secondary | ICD-10-CM | POA: Diagnosis not present

## 2023-10-19 DIAGNOSIS — Z951 Presence of aortocoronary bypass graft: Secondary | ICD-10-CM | POA: Diagnosis not present

## 2023-10-19 DIAGNOSIS — I484 Atypical atrial flutter: Secondary | ICD-10-CM | POA: Insufficient documentation

## 2023-10-19 DIAGNOSIS — Z7901 Long term (current) use of anticoagulants: Secondary | ICD-10-CM | POA: Diagnosis not present

## 2023-10-19 DIAGNOSIS — I252 Old myocardial infarction: Secondary | ICD-10-CM | POA: Diagnosis not present

## 2023-10-19 DIAGNOSIS — J449 Chronic obstructive pulmonary disease, unspecified: Secondary | ICD-10-CM | POA: Diagnosis not present

## 2023-10-19 NOTE — Progress Notes (Signed)
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. The patient was initially diagnosed with atrial fibrillation several years ago and is s/p afib ablation 06/25/14 at [facility] by Dr. Hurman Horn and has been maintained on Tikosyn since. He has had some possible a fib, atrial flutter vs ectopic atrial rhythm. On 12/07/18, he was noted to be in an ectopic atrial rhythm with 2:1 block and was cardioverted on 12/11/18. On review of his ECGs, it appears he had some transient AV dissociation post cardioversion but is back in SR on follow up. He has been asymptomatic. He denies bleeding issues with anticoagulation. He has OSA and struggles with using his CPAP. He is now on an oral device and follows with Dr Mayford Knife and Dr Toni Arthurs.  Patient is s/p repeat afib ablation 06/08/19 with Dr Elberta Fortis. Patient admitted 10/24-10/26/20 with acute CVA after holding anticoagulation for oral surgery. He is on Eliquis for a CHADS2VASC score of 6. Patient is s/p DCCV 11/20/19. Patient was back in atrial flutter at his visit with Dr Elberta Fortis on 06/10/20. His BB was stopped at that point 2/2 side effects of fatigue. Patient reports he feels like he has more energy off the BB however, his heart rate had been elevated around 120-130 at home. Patient is s/p DCCV on 07/10/20.   On follow up today, patient is s/p repeat ablation 11/19/20 with Dr Elberta Fortis. He reports that he has noted an irregular pulse on his home BP machine starting 12/5. He does have some fatigue but otherwise feels well. ECG today shows sinus rhythm with frequent junctional beats. He denies CP, swallowing, or groin issues.  On follow up 10/19/23, he is currently in atrial flutter.  S/p successful DCCV on 09/30/23. He is taking Tikosyn 250 mcg BID. He states he is not surprised he is back out of rhythm.   Today, he denies symptoms of palpitations, SOB, chest pain, orthopnea, PND, lower extremity edema, presyncope, syncope, bleeding, or neurologic sequela. The patient is tolerating medications without difficulties and is otherwise without complaint today.    Atrial Fibrillation Risk Factors:  he does have symptoms or diagnosis of sleep apnea. he is compliant with OSA therapy. Now on oral appliance.    he has a BMI of Body mass index is 18.43 kg/m.[name] Filed Weights   10/19/23 0936  Weight: 51.8 kg    Atrial Fibrillation Management history:  Previous antiarrhythmic drugs: Tikosyn (avoid amio given abn PFTs) Previous cardioversions: 12/11/18, 11/20/19, 07/10/20, 09/30/23 Previous ablations: 2015 at [facility], 06/08/19, 11/19/20 CHADS2VASC score: 6 (age, HTN, CAD, CVA) Anticoagulation history: Xarelto, Eliquis   Past Medical History:  Diagnosis Date   Abnormal nuclear cardiac imaging test March 2011   Has positive EKG response, no perfusion defect and normal EF   Adenomatous colon polyp 12/2002   Arrhythmia    Atrial fibrillation (HCC)    a. s/p rfca;  b. chronic tikosyn and xarelto.   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 04/2017 Echo: EF 65-70%, restrictive physiology, mildly dil LA.   COPD (chronic obstructive pulmonary disease) (HCC)  Coronary artery disease    First MI in 1988. PCI in 1996 with subsequent CABG in 1996 due to restenosis. S/P stents to LCX in 2009. Noted to have residual disease in the LAD in a diffuse manner and atretic LIMA graft. He is managed medically.    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   History of Raynaud's syndrome    Hyperlipidemia    Hypertension    Internal hemorrhoids    Muscle spasm    Myocardial infarction (HCC)    MI x1 1989 - 4 age   Neuromuscular disorder (HCC)    Polio - age 87   OSA (obstructive sleep apnea) 10/16/2018   Severe OSA with AHI 37.2/hr on BiPAP   Persistent atrial fibrillation (HCC)    Polio    age 4   PVC (premature ventricular contraction)    Scoliosis    mild   Past Surgical History:  Procedure Laterality Date   ABLATION     done for a fib   APPENDECTOMY     Age 13   ATRIAL FIBRILLATION ABLATION N/A 06/08/2019   Procedure: ATRIAL FIBRILLATION ABLATION;  Surgeon: Regan Lemming, MD;  Location: [facility]AS[facility] CV LAB;  Service: Cardiovascular;  Laterality: N/A;   ATRIAL FIBRILLATION ABLATION N/A 11/19/2020   Procedure: ATRIAL FIBRILLATION ABLATION;  Surgeon: Regan Lemming, MD;  Location: [facility] CV [facility];  Service: Cardiovascular;  Laterality: N/A;   CARDIAC CATHETERIZATION     CARDIOVERSION N/A 08/22/2013   Procedure: CARDIOVERSION;  Surgeon: Luis Abed, MD;  Location: [facility] ENDOSCOPY;  Service: Cardiovascular;  Laterality: N/A;   CARDIOVERSION N/A 01/16/2014   Procedure: CARDIOVERSION;  Surgeon: Lewayne Bunting, MD;  Location: [facility] ENDOSCOPY;  Service: Cardiovascular;  Laterality: N/A;   CARDIOVERSION N/A 12/11/2018   Procedure: CARDIOVERSION;  Surgeon: Laurey Morale, MD;  Location: [facility] ENDOSCOPY;  Service: Cardiovascular;  Laterality: N/A;   CARDIOVERSION N/A 11/20/2019   Procedure: CARDIOVERSION;  Surgeon: Little Ishikawa, MD;  Location: [facility] ENDOSCOPY;  Service: Endoscopy;  Laterality: N/A;   CARDIOVERSION N/A 07/10/2020   Procedure: CARDIOVERSION;  Surgeon: Laurey Morale, MD;  Location: [facility] ENDOSCOPY;  Service: Cardiovascular;  Laterality: N/A;   CARDIOVERSION N/A 01/21/2021   Procedure:  CARDIOVERSION;  Surgeon: Laurey Morale, MD;  Location: [facility] ENDOSCOPY;  Service: Cardiovascular;  Laterality: N/A;   CARDIOVERSION N/A 09/30/2023   Procedure: CARDIOVERSION;  Surgeon: Tessa Lerner, DO;  Location: [facility] INVASIVE CV LAB;  Service: Cardiovascular;  Laterality: N/A;   COLONOSCOPY  2008   CORONARY ARTERY BYPASS GRAFT  1996   with a LIMA to the LAD, SVG to RCA and SVG to OM   CORONARY STENT INTERVENTION N/A 03/26/2019   Procedure: CORONARY STENT INTERVENTION;  Surgeon: Corky Crafts, MD;  Location: [facility] INVASIVE CV LAB;  Service: Cardiovascular;  Laterality: N/A;   CORONARY STENT PLACEMENT  1996   LCX   EMBOLECTOMY Right 05/09/2019   Procedure: Embolectomy of right radial artery;  Surgeon: Nada Libman, MD;  Location: [facility] OR;  Service: Vascular;  Laterality: Right;   ESOPHAGEAL DILATION     EYE SURGERY     FALSE ANEURYSM REPAIR Right 05/09/2019   Procedure: REPAIR FALSE ANEURYSM RIGHT RADIAL;  Surgeon: Nada Libman,  MD;  Location: [facility] OR;  Service: Vascular;  Laterality: Right;   LEFT HEART CATH AND CORS/GRAFTS ANGIOGRAPHY N/A 03/26/2019   Procedure: LEFT HEART CATH AND CORS/GRAFTS ANGIOGRAPHY;  Surgeon: Laurey Morale, MD;  Location: [facility]VAS[facility] CV LAB;  Service: Cardiovascular;  Laterality: N/A;   POLYPECTOMY     TONSILLECTOMY     UPPER GASTROINTESTINAL ENDOSCOPY     esophegeal dilation    Current Outpatient Medications  Medication Sig Dispense Refill   acetaminophen (TYLENOL) 500 MG tablet Take 250-500 mg by mouth every 8 (eight) hours as needed for mild pain or moderate pain.      albuterol (PROAIR HFA) 108 (90 BASE) MCG/ACT inhaler Inhale 2 puffs into the lungs every 4 (four) hours as needed for wheezing or shortness of breath (and prior to exercise). 1 Inhaler 3   Cholecalciferol (VITAMIN D3) 50 MCG (2000 UT) CAPS Take 2,000 Units by mouth every evening.     dicyclomine (BENTYL) 10 MG capsule TAKE ONE CAPSULE BY MOUTH 3 TIMES DAILY AS NEEDED FOR SPASMS 270 capsule  3   diltiazem (CARDIZEM CD) 240 MG 24 hr capsule TAKE 1 CAPSULE BY MOUTH EVERYDAY AT BEDTIME 90 capsule 3   dofetilide (TIKOSYN) 250 MCG capsule TAKE 1 CAPSULE BY MOUTH EVERY 12 HOURS 180 capsule 3   ELIQUIS 2.5 MG TABS tablet TAKE 1 TABLET BY MOUTH TWICE A DAY 180 tablet 3   ezetimibe (ZETIA) 10 MG tablet TAKE 1 TABLET BY MOUTH EVERY DAY 90 tablet 3   famotidine (PEPCID) 40 MG tablet TAKE 1 TABLET BY MOUTH EVERYDAY AT BEDTIME 90 tablet 3   furosemide (LASIX) 20 MG tablet Take 1 tablet (20 mg total) by mouth as needed. 30 tablet 3   JARDIANCE 10 MG TABS tablet TAKE 1 TABLET EVERY DAY BEFORE BREAKFAST 90 tablet 3   loperamide (IMODIUM) 2 MG capsule Take 2 mg by mouth as needed for diarrhea or loose stools.     LORazepam (ATIVAN) 0.5 MG tablet Take 0.5 mg by mouth at bedtime.     methocarbamol (ROBAXIN) 500 MG tablet Take 500 mg by mouth 3 (three) times daily as needed for muscle spasms.      nitroGLYCERIN (NITROSTAT) 0.4 MG SL tablet Place 1 tablet (0.4 mg total) under the tongue every 5 (five) minutes as needed for chest pain. 25 tablet 0   Probiotic Product (ALIGN) 4 MG CAPS Take 4 mg by mouth daily. (0800)     rosuvastatin (CRESTOR) 40 MG tablet TAKE 1 TABLET BY MOUTH EVERY DAY 90 tablet 3   tadalafil (CIALIS) 10 MG tablet Take 10 mg by mouth daily in the afternoon. Troche* for circulation     Testosterone POWD Apply 100 mg topically daily. Testosterone (AT) 150/0(150mg ) cpd     traZODone (DESYREL) 50 MG tablet Take 50 mg by mouth at bedtime.     lactobacillus acidophilus & bulgar (LACTINEX) chewable tablet Chew 2 tablets by mouth at bedtime. (Patient not taking: Reported on 10/19/2023)     No current facility-administered medications for this encounter.    Allergies  Allergen Reactions   Morphine And Codeine Other (See Comments)    IV forms- vein irritation   Tape Other (See Comments)    Skin Irritation (Plastic)   ROS- All systems are reviewed and negative except as per the HPI  above.  Physical Exam: Vitals:   10/19/23 0936  BP: (!) 140/70  Pulse: 92  Weight: 51.8 kg  Height: 5\' 6"  (1.676 m)  GEN- The patient is well appearing, alert and oriented x 3 today.   Neck - no JVD or carotid bruit noted Lungs- Clear to ausculation bilaterally, normal work of breathing Heart- Irregular rate and rhythm, no murmurs, rubs or gallops, PMI not laterally displaced Extremities- no clubbing, cyanosis, or edema Skin - no rash or ecchymosis noted   Wt Readings from Last 3 Encounters:  10/19/23 51.8 kg  09/30/23 50.8 kg  09/19/23 50.8 kg    EKG today demonstrates  Vent. rate 92 BPM PR interval * ms QRS duration 102 ms QT/QTcB 402/497 ms P-R-T axes * 71 -25 Undetermined rhythm ST & T wave abnormality, consider inferior ischemia Prolonged QT Abnormal ECG When compared with ECG of 30-Sep-2023 11:27, PREVIOUS ECG IS PRESENT  Echo 08/03/22 demonstrated   1. Left ventricular ejection fraction, by estimation, is 55 to 60%. The  left ventricle has normal function. The left ventricle has no regional  wall motion abnormalities. Left ventricular diastolic parameters are  consistent with Grade II diastolic  dysfunction (pseudonormalization).   2. Right ventricular systolic function is mildly reduced. The right  ventricular size is mildly enlarged. There is mildly elevated pulmonary  artery systolic pressure. The estimated right ventricular systolic  pressure is 41.4 mmHg.   3. Left atrial size was moderately dilated.   4. Right atrial size was mildly dilated.   5. The mitral valve is degenerative. Mild to moderate mitral valve  regurgitation. No evidence of mitral stenosis. Moderate to severe mitral  annular calcification.   6. The aortic valve is tricuspid. There is moderate calcification of the  aortic valve. Aortic valve regurgitation is not visualized. Aortic valve  sclerosis is present, with no evidence of aortic valve stenosis.   7. The inferior vena cava is  normal in size with <50% respiratory  variability, suggesting right atrial pressure of 8 mmHg.   8. There appears to be a PFO by color doppler.   Epic records reviewed.   Assessment and Plan:  1. Persistent atrial fibrillation/atypical atrial flutter S/p repeat afib ablation with Dr Elberta Fortis 06/08/19. Per procedure report, flutter unable to be ablated due to complexity of atrial flutter. S/p repeat ablation 11/19/20 S/p successful DCCV on 09/30/23.   He is currently in atrial flutter. He has early return of atrial flutter. We discussed rate control versus other option for rhythm control would possibly include transition to amiodarone. 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 He would like to think it over and possibly ask Dr. Shirlee Latch and also maybe ask Dr. Elberta Fortis. I advised patient to please   Continue Tikosyn 250 mcg BID.  Continue diltiazem to 240 mg daily. Recall he did not tolerate BB 2/2 side effects in the past.  Continue Eliquis 2.5 mg BID    This patients CHA2DS2-VASc Score and unadjusted Ischemic Stroke Rate (% per year) is equal to 9.7 % stroke rate/year from a score of 6  Above score calculated as 1 point each if present [CHF, HTN, DM, Vascular=MI/PAD/Aortic Plaque, Age if 65-74, or Male] Above score calculated as 2 points each if present [Age > 75, or Stroke/TIA/TE]  2. Obstructive sleep apnea Unable to tolerate facemask.  Patient compliant with oral device.  3. CAD/Ischemic CM No anginal symptoms.  4. HTN Stable, med changes as above.   Follow up as scheduled with Dr. Shirlee Latch. He will call clinic if decides to proceed with amiodarone.   Justin Mend, PA-C [facility] [address]  [place], [place] [address] [phone]

## 2023-11-09 ENCOUNTER — Ambulatory Visit (HOSPITAL_COMMUNITY)
Admission: RE | Admit: 2023-11-09 | Discharge: 2023-11-09 | Disposition: A | Discharge status: Home / Self Care | Payer: Medicare Other | Source: Ambulatory Visit | Attending: Cardiology | Admitting: Cardiology

## 2023-11-09 ENCOUNTER — Encounter (HOSPITAL_COMMUNITY): Payer: Self-pay | Admitting: Cardiology

## 2023-11-09 VITALS — BP 142/60 | HR 80 | Wt 113.0 lb

## 2023-11-09 DIAGNOSIS — Z79899 Other long term (current) drug therapy: Secondary | ICD-10-CM | POA: Diagnosis not present

## 2023-11-09 DIAGNOSIS — I255 Ischemic cardiomyopathy: Secondary | ICD-10-CM | POA: Insufficient documentation

## 2023-11-09 DIAGNOSIS — G4733 Obstructive sleep apnea (adult) (pediatric): Secondary | ICD-10-CM | POA: Diagnosis not present

## 2023-11-09 DIAGNOSIS — I2510 Atherosclerotic heart disease of native coronary artery without angina pectoris: Secondary | ICD-10-CM | POA: Insufficient documentation

## 2023-11-09 DIAGNOSIS — R9431 Abnormal electrocardiogram [ECG] [EKG]: Secondary | ICD-10-CM | POA: Diagnosis not present

## 2023-11-09 DIAGNOSIS — Z951 Presence of aortocoronary bypass graft: Secondary | ICD-10-CM | POA: Insufficient documentation

## 2023-11-09 DIAGNOSIS — I4819 Other persistent atrial fibrillation: Secondary | ICD-10-CM | POA: Diagnosis not present

## 2023-11-09 DIAGNOSIS — I5032 Chronic diastolic (congestive) heart failure: Secondary | ICD-10-CM | POA: Insufficient documentation

## 2023-11-09 DIAGNOSIS — I484 Atypical atrial flutter: Secondary | ICD-10-CM | POA: Insufficient documentation

## 2023-11-09 DIAGNOSIS — J984 Other disorders of lung: Secondary | ICD-10-CM | POA: Diagnosis not present

## 2023-11-09 DIAGNOSIS — Z7901 Long term (current) use of anticoagulants: Secondary | ICD-10-CM | POA: Diagnosis not present

## 2023-11-09 DIAGNOSIS — Z8673 Personal history of transient ischemic attack (TIA), and cerebral infarction without residual deficits: Secondary | ICD-10-CM | POA: Insufficient documentation

## 2023-11-09 DIAGNOSIS — E785 Hyperlipidemia, unspecified: Secondary | ICD-10-CM | POA: Insufficient documentation

## 2023-11-09 LAB — LIPID PANEL
Cholesterol: 149 mg/dL (ref 0–200)
HDL: 62 mg/dL (ref >40)
LDL Cholesterol: 77 mg/dL (ref 0–99)
Total CHOL/HDL Ratio: 2.4 {ratio}
Triglycerides: 50 mg/dL (ref <150)
VLDL: 10 mg/dL (ref 0–40)

## 2023-11-09 LAB — BASIC METABOLIC PANEL
Anion gap: 7 (ref 5–15)
BUN: 21 mg/dL (ref 8–23)
CO2: 29 mmol/L (ref 22–32)
Calcium: 10.5 mg/dL — ABNORMAL HIGH (ref 8.9–10.3)
Chloride: 102 mmol/L (ref 98–111)
Creatinine, Ser: 1.21 mg/dL (ref 0.61–1.24)
GFR, Estimated: 59 mL/min — ABNORMAL LOW (ref >60)
Glucose, Bld: 61 mg/dL — ABNORMAL LOW (ref 70–99)
Potassium: 4.5 mmol/L (ref 3.5–5.1)
Sodium: 138 mmol/L (ref 135–145)

## 2023-11-09 LAB — MAGNESIUM: Magnesium: 2.3 mg/dL (ref 1.7–2.4)

## 2023-11-09 NOTE — Progress Notes (Signed)
ID:  [name], DOB 10/29/1936, MRN [id number]  Provider location: [place] [facility] Type of Visit: Established patient  PCP:  Jimmy Paradise, MD Cardiologist:  Dr. Shirlee Weiss   History of Present Illness: [name] is a 87 y.o. male who has history of CAD s/p CABG.  Cath in 11/09 showed that the SVG-distal RCA was patent, the CFX system was patent.  His LIMA was atretic and there were serial 60% and 80% stenoses in the native LAD.  He was managed medically.  Echo in 8/14 showed EF 55-60% with moderate Jimmy and moderate TR.    He was initially noted to have atrial fibrillation in the summer of 2014. He was started on Xarelto and cardioverted to NSR in 8/14.  Recurrent atrial fibrillation was noted in 1/15, and he was cardioverted to NSR again.  This time, NSR did not hold long. By 5/15, he was in persistent atrial fibrillation.  I referred him to [facility] where he had atrial fibrillation ablation and Tikosyn loading.  Ranolazine was stopped due to risk of QT prolongation and Imdur was started as an anti-anginal instead.  Lexiscan Cardiolite in 11/15 showed no ischemia.     CTA chest done in 2/16 to look for evidence for PV stenosis post-AF ablation.  This showed mild short-segment narrowing of the left inferior pulmonary vein.    Patient was admitted in 5/18 with acute on chronic diastolic CHF.  He had been at the beach for [place] days and ate out a number of times, probably getting a significant sodium load.  No chest pain.  He was in normal sinus rhythm.  He was started on IV Lasix and diuresed.  Echo in 5/18 showed EF 65-70% with moderately dilated RV and normal systolic function, PASP 57 mmHg.  Lexiscan Cardiolite in 6/18 showed no significant perfusion defect.    He was admitted with NSTEMI in 3/20, had DES to ostial LAD and mid SVG-RCA.  Echo showed EF 45-50%.  He was in atypical atrial flutter persistently despite Tikosyn.  After his intervention, he was noted to  have a radial artery pseudoaneurysm that was repaired by Dr. Myra Weiss.   In 6/20, he had a redo atrial fibrillation ablation.  He was also noted to have complex flutter from multiple foci that was not ablated.  After the procedure, he has had considerable pain in the right leg at the cath site but also radiating down the leg, groin US showed no AV fistula or pseudoaneurysm.    In 10/20, patient held anticoagulation for 2 days for oral surgery and had a CVA presenting as right hand weakness.  He did not get tPA.  Echo in 10/20 showed EF 50-55%, mild LVH, moderately decreased RV function, small PFO. Carotid dopplers showed 1-39% bilateral stenosis. He was switched from Xarelto to Eliquis.   In 11/20, he went into persistent atrial fibrillation and underwent DCCV.  In 7/21, he went into atypical flutter and had DCCV to NSR.  He had a repeat atrial fibrillation ablation in 11/21.   Echo in 1/22 showed EF 60-65%, normal RV, mild-moderate Jimmy. DCCV to NSR in 1/22.   Given significant weight loss, patient had CT chest/abdomen/pelvis in 7/22, this showed no worrisome abnormalities.   Echo in 8/23 showed EF 55-60%, grade 2 diastolic dysfunction, mild RV dilation and mild RV dysfunction, moderate LAE, mild-moderate Jimmy, PASP 41 mmHg.   Patient had DCCV for atypical atrial flutter in 10/24 but had  return of atypical flutter shortly afterwards.   He returns for followup of CHF, atrial fibrillation, and CAD.  Rhythm today appears again to be atypical flutter with 2:1 block. He reports only occasional palpitations.  Weight is stable.  He is short of breath chronically with heavy exertion such as walking fast up stairs and notes bendopnea at times.  No changes in his symptoms.  Does fine walking around grocery store.  No chest pain. Weight is stable.   ECG (personally reviewed): Atypical atrial flutter with 2:1 block, QTc 466  Labs (8/12): K 4.1, creatinine 1.2, LDL 91, HDL 53 Labs (8/14): K 4.6, creatinine  1.1 Labs (11/14): K 4.6, creatinine 1.2, LDL 91, HDL 56 Labs (3/15): AST 38, ALT 32, TSH normal, BNP 237 Labs (7/15): LDL 96, HDL 48, K 4.2, creatinine 1.2, TSH normal, BNP 237, AST 38, ALT 32 Labs (8/15): LFTs normal Labs (11/15): K 4.2, creatinine 1.1, Mg 2.2, BNP 227 Labs (2/16): K 4, creatinine 1.32, BNP 261 Labs (6/18): K 3.9, creatinine 1.6 Labs (7/18): K 3.9, creatinine 1.5, BNP 242 Labs (12/18): K 3.8, creatinine 1.4, hgb 13.9, LDL 73, HDL 52 Labs (3/19): K 3.7, creatinine 1.4, LDL 76, HDL 42 Labs (6/19): K 4.1, creatinine 1.63 Labs (3/20): K 4.2, creatinine 1.29, LDL 53, HDL 47, hgb 13.8 Labs (4/20): K 4.3, creatinine 1.5 Labs (6/20): K 4.2, creatinine 1.84, hgb 12.4 Labs (7/20): K 4.5, creatinine 2.53 => 1.72 Labs (8/20): K 4.7, creatinine 1.5, Mg 2.4 Labs (12/20): LDL 79, K 4.9, creatinine 1.4 Labs (2/21): K 4.5, creatinine 1.56 Labs (8/21): K 4, creatinine 1.4, LDL 69, HDL 52 Labs (12/21): K 4.4, creatinine 1.21, LDL 85 Labs (3/22): K 4.1, creatinine 1.6, hgb 13.3, BNP 197 Labs (7/22): creatinine 1.72 Labs (8/22): LDL 74, HDL 50, BNP 307, K 4.5, creatinine 1.47 Labs (12/22): LDL 74, HDL 49, K 4.1, creatinine 1.5 Labs (2/23): BNP 166, K 5.2, creatinine 1.33 Labs (5/23): K 3.8, creatinine 1.32 Labs (8/23): LDL 71, K 4.1, creatinine 7.82 Labs (12/23): K 4, creatinine 1.33 Labs (6/24): K 4.5, creatinine 1.33, LDL 79, BNP 160   PMH: 1. CAD: 1st MI in 1988 (while [place] feet on hiking [place] [employer]).  CABG 1996.  PCI to CFX in 2009.  LHC (11/09) SVG-dRCA patent, total occlusion RCA, patent CFX stent, atretic LIMA, serial 60 and 80% proximal LAD stenoses, EF 60% with basal inferior hypokinesis.  Myoview in 2011 with no ischemia or infarction.  Echo (10/12) with EF 55-60%, mild LVH, mild Jimmy.  Lexiscan Cardiolite in 2013 with no ischemia or infarction.  Echo (8/14) with EF 55-60%, moderate Jimmy, moderate TR, PA systolic pressure 35 mmHg.  Echo (4/15) with EF 60-65%,  mild focal basal septal hypertrophy, inferior basal akinesis, mild Jimmy.  Lexiscan cardiolite (11/15) with EF 61%, fixed basal inferoseptal defect, no ischemia.  - Lexiscan Cardiolite (6/18): EF 54%, no perfusion defect.  - NSTEMI 3/20, cath showed 95% ostial/proximal LAD, 60-70% mid LAD, totally occluded SVG-LCx, totally occluded RCA, 80-90% mid SVG-RCA.  Patient had DES to ostial-mid LAD and DES to SVG-RCA.  2. Raynauds syndrome 3. Post-polio syndrome 4. GERD with dilation of esophageal stricture in 12/12.  5. Hyperlipidemia 6. H/o CVAs: Brainstem stroke with Horner's syndrome.  Recurrent stroke in 10/20 with right hand weakness when off anticoagulation for 2 days.  - Carotid dopplers (10/20) with minimal stenosis.  7. Scoliosis. 8. H/o appendectomy 9. Herpes Zoster 10. Atrial fibrillation/atypical atrial flutter: DCCV to NSR in  8/14. DCCV to NSR in 1/15. Atrial fibrillation ablation 6/15 with Tikosyn loading (at [facility]).   - Redo atrial fibrillation ablation in 6/20.  Patient also noted to have complex flutter with several foci, not ablated.  - Zio patch (7/20): Primarily NSR with a few short SVT runs.  - DCCV 11/20.  - DCCV 7/21 - Redo atrial fibrillation ablation 11/21.  - DCCV 1/22 - DCCV 10/24 11. PFTs (4/15) with FVC 59%, FEV1 54%, ratio 91%, DLCO 53% => moderate obstructive defect thought to be related to COPD and severe restrictive defect thought to be due to elevated left hemidiaphragm and post-polio syndrome.  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  Echo (5/18) with EF 65-70%, RV moderately dilated with normal systolic function, PASP 57 mmHg.  - Echo (3/20): EF 45-50%, mild LV dilation, basal inferolateral and inferior hypokinesis.  - Echo (10/20): EF 50-55%, mild LVH, basal inferior and inferoseptal hypokinesis, moderately decreased RV systolic function, small PFO.  - Echo (1/22): EF 60-65%, normal RV, mild-moderate Jimmy.  - Echo (8/23): EF 55-60%, grade 2 diastolic dysfunction, mild RV dilation  and mild RV dysfunction, moderate LAE, mild-moderate Jimmy, PASP 41 mmHg.  13. CKD: Stage 3.  14. OSA: dental appliance.  15. Right radial artery pseudoaneurysm: post-cath in 3/20, s/p repair.  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 H/o esophageal stricture  Current Outpatient Medications  Medication Sig Dispense Refill   acetaminophen (TYLENOL) 500 MG tablet Take 250-500 mg by mouth every 8 (eight) hours as needed for mild pain or moderate pain.      albuterol (PROAIR HFA) 108 (90 BASE) MCG/ACT inhaler Inhale 2 puffs into the lungs every 4 (four) hours as needed for wheezing or shortness of breath (and prior to exercise). 1 Inhaler 3   Cholecalciferol (VITAMIN D3) 50 MCG (2000 UT) CAPS Take 2,000 Units by mouth every evening.     dicyclomine (BENTYL) 10 MG capsule TAKE ONE CAPSULE BY MOUTH 3 TIMES DAILY AS NEEDED FOR SPASMS 270 capsule 3   diltiazem (CARDIZEM CD) 240 MG 24 hr capsule TAKE 1 CAPSULE BY MOUTH EVERYDAY AT BEDTIME 90 capsule 3   dofetilide (TIKOSYN) 250 MCG capsule TAKE 1 CAPSULE BY MOUTH EVERY 12 HOURS 180 capsule 3   ELIQUIS 2.5 MG TABS tablet TAKE 1 TABLET BY MOUTH TWICE A DAY 180 tablet 3   ezetimibe (ZETIA) 10 MG tablet TAKE 1 TABLET BY MOUTH EVERY DAY 90 tablet 3   famotidine (PEPCID) 40 MG tablet TAKE 1 TABLET BY MOUTH EVERYDAY AT BEDTIME 90 tablet 3   furosemide (LASIX) 20 MG tablet Take 1 tablet (20 mg total) by mouth as needed. 30 tablet 3   JARDIANCE 10 MG TABS tablet TAKE 1 TABLET EVERY DAY BEFORE BREAKFAST 90 tablet 3   loperamide (IMODIUM) 2 MG capsule Take 2 mg by mouth as needed for diarrhea or loose stools.     LORazepam (ATIVAN) 0.5 MG tablet Take 0.5 mg by mouth at bedtime.     methocarbamol (ROBAXIN) 500 MG tablet Take 500 mg by mouth 3 (three) times daily as needed for muscle spasms.      nitroGLYCERIN (NITROSTAT) 0.4 MG SL tablet Place 1 tablet (0.4 mg total) under the tongue every 5 (five) minutes as needed for chest pain. 25 tablet 0   Probiotic Product  (ALIGN) 4 MG CAPS Take 4 mg by mouth daily. (0800)     rosuvastatin (CRESTOR) 40 MG tablet TAKE 1 TABLET BY MOUTH EVERY DAY 90 tablet 3   tadalafil (CIALIS) 10 MG tablet  Take 10 mg by mouth daily in the afternoon. [place]* for circulation     Testosterone POWD Apply 100 mg topically daily. Testosterone (AT) 150/0(150mg ) cpd     traZODone (DESYREL) 50 MG tablet Take 50 mg by mouth at bedtime.     lactobacillus acidophilus & bulgar (LACTINEX) chewable tablet Chew 2 tablets by mouth at bedtime. (Patient not taking: Reported on 10/19/2023)     No current facility-administered medications for this encounter.    Allergies:   Morphine and codeine and Tape   Social History:  The patient  reports that he quit smoking about 56 years ago. His smoking use included cigarettes. He started smoking about 61 years ago. He has a 2.5 pack-year smoking history. He quit smokeless tobacco use about 74 years ago. He reports that he does not drink alcohol and does not use drugs.   Family History:  The patient's family history includes Cancer in his son; Heart Problems in his brother; Heart disease in his father, maternal grandfather, maternal grandmother, mother, paternal grandfather, and paternal grandmother; Prostate cancer in his paternal uncle.   ROS:  Please see the history of present illness.   All other systems are personally reviewed and negative.   Exam:   BP (!) 142/60   Pulse 80   Wt 51.3 kg (113 lb)   SpO2 97%   BMI 18.24 kg/m  General: NAD Neck: No JVD, no thyromegaly or thyroid nodule.  Lungs: Clear to auscultation bilaterally with normal respiratory effort. CV: Nondisplaced PMI.  Heart regular S1/S2, no S3/S4, no murmur.  No peripheral edema.  No carotid bruit.  Normal pedal pulses.  Abdomen: Soft, nontender, no hepatosplenomegaly, no distention.  Skin: Intact without lesions or rashes.  Neurologic: Alert and oriented x 3.  Psych: Normal affect. Extremities: No clubbing or cyanosis.  HEENT:  Normal.   Recent Labs: 06/23/2023: B Natriuretic Peptide 159.7; Platelets 154 09/30/2023: Hemoglobin 17.0 11/09/2023: BUN 21; Creatinine, Ser 1.21; Magnesium 2.3; Potassium 4.5; Sodium 138  Personally reviewed   Wt Readings from Last 3 Encounters:  11/09/23 51.3 kg (113 lb)  10/19/23 51.8 kg (114 lb 3.2 oz)  09/30/23 50.8 kg (112 lb)   ASSESSMENT AND PLAN:  1. Atrial fibrillation/flutter: He has been on Tikosyn and is s/p atrial fibrillation ablation at [facility] on 06/25/14.  He has also had atypical atrial flutter. He had a redo atrial fibrillation ablation in 6/20.  Complex flutter was noted from several foci and was not ablated.  Zio patch from 7/20 showed predominantly NSR with short runs SVT.  DCCV in 11/20, DCCV again in 7/21 and in 1/22. Redo atrial fibrillation ablation in 11/21.  He has significant sinus node disease/sick sinus syndrome and may eventually require pacemaker.  Today, he is again in atypical flutter with 2:1 block with stable HR around 80 after failed DCCV in 10/24. Minimally symptomatic.  - We discussed transition to amiodarone to see if this would work better for maintenance of NSR. He is not sure that he wants to switch to amiodarone given possible side effects and minimal symptoms.  He will call me if he wants to make the transition.  I would not cardiovert him again unless he transitions to amiodarone.  - Continue Tikosyn for now.  It may be helping to keep him out of atrial fibrillation (he is in a relatively organized 2:1 atypical flutter).  QTc today is acceptable (466 msec). Check BMET/Mg today.  Will consult with Dr. Elberta Fortis about the benefit of keeping him  on Tikosyn just to keep him out of atrial fibrillation (though will likely stay in flutter).  - Continue apixaban, this is appropriately dosed at 2.5 mg bid with age and low weight.   - Can continue current diltiazem CD.  2. CAD: H/o CABG.  He had NSTEMI in 3/20 with DES to ostial/proximal LAD and SVG-RCA.  No chest  pain.  - He will continue apixaban long-term for atrial fibrillation/flutter.  - He is on statin and Zetia, check lipids today.  3. Hyperlipidemia: He is taking Crestor 40 mg daily and Zetia 10 mg daily.  Check lipids today.   4. Ischemic cardiomyopathy/primarily diastolic CHF: Echo in 3/20 with EF 45-50%, echo in 10/20 with EF 50-55%, echo in 1/22 with EF 60-65%.  Echo in 8/23 showed EF 55-60%, grade 2 diastolic dysfunction, mild RV dilation and mild RV dysfunction, moderate LAE, mild-moderate Jimmy, PASP 41 mmHg.  NYHA class II symptoms.  Not volume overloaded on exam.  - He takes Lasix prn.     - Continue Jardiance 10 mg daily. - He is off losartan with elevated creatinine.  5. Pulmonary: PFTs suggested both a restrictive and obstructive defect.  The restrictive defect is likely from post-polio syndrome and elevated left hemidiaphragm.  [name] never smoked much, so the obstructive defect is more surprising.  6. OSA: Severe, likely potentiates his atrial fibrillation. Unable to tolerate CPAP or oral appliance.   7. CVA: 10/20, in setting of holding anticoagulation for 2 days.  Avoid holding anticoagulation in the future.  Mild residual effect on right hand dexterity.   Followup in 3 months.    Signed, Marca Ancona, MD  11/09/2023   [facility] [address] [facility] [place] [address] [phone] (office) [phone] (fax)

## 2023-11-09 NOTE — Patient Instructions (Signed)
 There has been no changes to your medications.  Labs done today, your results will be available in MyChart, we will contact you for abnormal readings.  Your physician recommends that you schedule a follow-up appointment in: 3 months (February 2025) ** PLEASE CALL THE OFFICE IN DECEMBER TO ARRANGE YOUR FOLLOW UP APPOINTMENT. **  If you have any questions or concerns before your next appointment please send [place] a message through [place] or call our office at [phone].    TO LEAVE A MESSAGE FOR THE NURSE SELECT OPTION 2, PLEASE LEAVE A MESSAGE INCLUDING: YOUR NAME DATE OF BIRTH CALL BACK NUMBER REASON FOR CALL**this is important as we prioritize the call backs  YOU WILL RECEIVE A CALL BACK THE SAME DAY AS LONG AS YOU CALL BEFORE 4:00 PM  At the [facility], you and your health needs are our priority. As part of our continuing mission to provide you with exceptional heart care, we have created designated Provider Care Teams. These Care Teams include your primary Cardiologist (physician) and Advanced Practice Providers (APPs- Physician Assistants and Nurse Practitioners) who all work together to provide you with the care you need, when you need it.   You may see any of the following providers on your designated Care Team at your next follow up: Dr Arvilla Meres Dr Marca Ancona Dr. Dorthula Nettles Dr. Clearnce Hasten Amy Filbert Schilder, NP Robbie Lis, [place] [facility] [place], [place] Brynda Peon, NP [place] Lee, NP Karle Plumber, PharmD   Please be sure to bring in all your medications bottles to every appointment.    Thank you for choosing [facility]

## 2023-12-04 ENCOUNTER — Other Ambulatory Visit: Payer: Self-pay | Admitting: Gastroenterology

## 2023-12-04 ENCOUNTER — Other Ambulatory Visit (HOSPITAL_COMMUNITY): Payer: Self-pay | Admitting: Cardiology

## 2024-02-13 ENCOUNTER — Encounter (HOSPITAL_COMMUNITY): Payer: Self-pay | Admitting: Cardiology

## 2024-02-13 ENCOUNTER — Ambulatory Visit (HOSPITAL_COMMUNITY)
Admission: RE | Admit: 2024-02-13 | Discharge: 2024-02-13 | Disposition: A | Discharge status: Home / Self Care | Payer: Medicare Other | Source: Ambulatory Visit | Attending: Cardiology | Admitting: Cardiology

## 2024-02-13 VITALS — BP 130/64 | HR 89 | Wt 119.6 lb

## 2024-02-13 DIAGNOSIS — I495 Sick sinus syndrome: Secondary | ICD-10-CM | POA: Diagnosis not present

## 2024-02-13 DIAGNOSIS — I4819 Other persistent atrial fibrillation: Secondary | ICD-10-CM | POA: Diagnosis not present

## 2024-02-13 DIAGNOSIS — Z79899 Other long term (current) drug therapy: Secondary | ICD-10-CM | POA: Insufficient documentation

## 2024-02-13 DIAGNOSIS — I471 Supraventricular tachycardia, unspecified: Secondary | ICD-10-CM | POA: Diagnosis not present

## 2024-02-13 DIAGNOSIS — I255 Ischemic cardiomyopathy: Secondary | ICD-10-CM | POA: Diagnosis not present

## 2024-02-13 DIAGNOSIS — Z8673 Personal history of transient ischemic attack (TIA), and cerebral infarction without residual deficits: Secondary | ICD-10-CM | POA: Diagnosis not present

## 2024-02-13 DIAGNOSIS — E785 Hyperlipidemia, unspecified: Secondary | ICD-10-CM | POA: Diagnosis not present

## 2024-02-13 DIAGNOSIS — I252 Old myocardial infarction: Secondary | ICD-10-CM | POA: Insufficient documentation

## 2024-02-13 DIAGNOSIS — Z951 Presence of aortocoronary bypass graft: Secondary | ICD-10-CM | POA: Diagnosis not present

## 2024-02-13 DIAGNOSIS — Z9049 Acquired absence of other specified parts of digestive tract: Secondary | ICD-10-CM | POA: Insufficient documentation

## 2024-02-13 DIAGNOSIS — I5032 Chronic diastolic (congestive) heart failure: Secondary | ICD-10-CM | POA: Diagnosis not present

## 2024-02-13 DIAGNOSIS — G4733 Obstructive sleep apnea (adult) (pediatric): Secondary | ICD-10-CM | POA: Insufficient documentation

## 2024-02-13 DIAGNOSIS — Z7901 Long term (current) use of anticoagulants: Secondary | ICD-10-CM | POA: Diagnosis not present

## 2024-02-13 DIAGNOSIS — Z955 Presence of coronary angioplasty implant and graft: Secondary | ICD-10-CM | POA: Diagnosis not present

## 2024-02-13 DIAGNOSIS — I484 Atypical atrial flutter: Secondary | ICD-10-CM | POA: Diagnosis present

## 2024-02-13 DIAGNOSIS — I2510 Atherosclerotic heart disease of native coronary artery without angina pectoris: Secondary | ICD-10-CM | POA: Insufficient documentation

## 2024-02-13 DIAGNOSIS — Z7984 Long term (current) use of oral hypoglycemic drugs: Secondary | ICD-10-CM | POA: Insufficient documentation

## 2024-02-13 LAB — BASIC METABOLIC PANEL
Anion gap: 7 (ref 5–15)
BUN: 16 mg/dL (ref 8–23)
CO2: 27 mmol/L (ref 22–32)
Calcium: 10.1 mg/dL (ref 8.9–10.3)
Chloride: 104 mmol/L (ref 98–111)
Creatinine, Ser: 1.25 mg/dL — ABNORMAL HIGH (ref 0.61–1.24)
GFR, Estimated: 56 mL/min — ABNORMAL LOW (ref >60)
Glucose, Bld: 98 mg/dL (ref 70–99)
Potassium: 4.3 mmol/L (ref 3.5–5.1)
Sodium: 138 mmol/L (ref 135–145)

## 2024-02-13 LAB — BRAIN NATRIURETIC PEPTIDE: B Natriuretic Peptide: 154.1 pg/mL — ABNORMAL HIGH (ref 0.0–100.0)

## 2024-02-13 MED ORDER — FUROSEMIDE 20 MG PO TABS: 20.0000 mg | ORAL_TABLET | Freq: Every day | ORAL | 3 refills | Status: DC

## 2024-02-13 MED ORDER — METOPROLOL SUCCINATE ER 25 MG PO TB24: 25.0000 mg | ORAL_TABLET | Freq: Every day | ORAL | 3 refills | Status: DC

## 2024-02-13 NOTE — Patient Instructions (Signed)
START Toprol XL 25 mg daily.  CHANGE Lasix to 20 mg daily.  Labs done today, your results will be available in MyChart, we will contact you for abnormal readings.  Repeat blood work in 10 days   You have been referred to the lipid clinic. They will call you to arrange your appointment.  Your physician recommends that you schedule a follow-up appointment in: 1 month.  If you have any questions or concerns before your next appointment please send [place] a message through [place] or call our office at [phone].    TO LEAVE A MESSAGE FOR THE NURSE SELECT OPTION 2, PLEASE LEAVE A MESSAGE INCLUDING: YOUR NAME DATE OF BIRTH CALL BACK NUMBER REASON FOR CALL**this is important as we prioritize the call backs  YOU WILL RECEIVE A CALL BACK THE SAME DAY AS LONG AS YOU CALL BEFORE 4:00 PM  At the [facility], you and your health needs are our priority. As part of our continuing mission to provide you with exceptional heart care, we have created designated Provider Care Teams. These Care Teams include your primary Cardiologist (physician) and Advanced Practice Providers (APPs- Physician Assistants and Nurse Practitioners) who all work together to provide you with the care you need, when you need it.   You may see any of the following providers on your designated Care Team at your next follow up: Dr Arvilla Meres Dr Marca Ancona Dr. Dorthula Nettles Dr. Clearnce Hasten Amy Filbert Schilder, NP Robbie Lis, [place] [facility] [place], [place] Brynda Peon, NP [place] Lee, NP Karle Plumber, PharmD   Please be sure to bring in all your medications bottles to every appointment.    Thank you for choosing [place]

## 2024-02-13 NOTE — Progress Notes (Signed)
ID:  [name], DOB 1936/06/21, MRN [id number]  Provider location: [place] [facility] Type of Visit: Established patient  PCP:  Geoffry Paradise, MD Cardiologist:  Dr. Shirlee Latch  Chief complaint: Shortness of breath   History of Present Illness: RUVIM RISKO is a 88 y.o. male who has history of CAD s/p CABG.  Cath in 11/09 showed that the SVG-distal RCA was patent, the CFX system was patent.  His LIMA was atretic and there were serial 60% and 80% stenoses in the native LAD.  He was managed medically.  Echo in 8/14 showed EF 55-60% with moderate MR and moderate TR.    He was initially noted to have atrial fibrillation in the summer of 2014. He was started on Xarelto and cardioverted to NSR in 8/14.  Recurrent atrial fibrillation was noted in 1/15, and he was cardioverted to NSR again.  This time, NSR did not hold long. By 5/15, he was in persistent atrial fibrillation.  I referred him to [facility] where he had atrial fibrillation ablation and Tikosyn loading.  Ranolazine was stopped due to risk of QT prolongation and Imdur was started as an anti-anginal instead.  Lexiscan Cardiolite in 11/15 showed no ischemia.     CTA chest done in 2/16 to look for evidence for PV stenosis post-AF ablation.  This showed mild short-segment narrowing of the left inferior pulmonary vein.    Patient was admitted in 5/18 with acute on chronic diastolic CHF.  He had been [place] the beach for [place] days and ate out a number of times, probably getting a significant sodium load.  No chest pain.  He was in normal sinus rhythm.  He was started on IV Lasix and diuresed.  Echo in 5/18 showed EF 65-70% with moderately dilated RV and normal systolic function, PASP 57 mmHg.  Lexiscan Cardiolite in 6/18 showed no significant perfusion defect.    He was admitted with NSTEMI in 3/20, had DES to ostial LAD and mid SVG-RCA.  Echo showed EF 45-50%.  He was in atypical atrial flutter persistently despite Tikosyn.   After his intervention, he was noted to have a radial artery pseudoaneurysm that was repaired by Dr. Myra Gianotti.   In 6/20, he had a redo atrial fibrillation ablation.  He was also noted to have complex flutter from multiple foci that was not ablated.  After the procedure, he has had considerable pain in the right leg at the cath site but also radiating down the leg, groin US showed no AV fistula or pseudoaneurysm.    In 10/20, patient held anticoagulation for 2 days for oral surgery and had a CVA presenting as right hand weakness.  He did not get tPA.  Echo in 10/20 showed EF 50-55%, mild LVH, moderately decreased RV function, small PFO. Carotid dopplers showed 1-39% bilateral stenosis. He was switched from Xarelto to Eliquis.   In 11/20, he went into persistent atrial fibrillation and underwent DCCV.  In 7/21, he went into atypical flutter and had DCCV to NSR.  He had a repeat atrial fibrillation ablation in 11/21.   Echo in 1/22 showed EF 60-65%, normal RV, mild-moderate MR. DCCV to NSR in 1/22.   Given significant weight loss, patient had CT chest/abdomen/pelvis in 7/22, this showed no worrisome abnormalities.   Echo in 8/23 showed EF 55-60%, grade 2 diastolic dysfunction, mild RV dilation and mild RV dysfunction, moderate LAE, mild-moderate MR, PASP 41 mmHg.   Patient had DCCV for atypical  atrial flutter in 10/24 but had return of atypical flutter shortly afterwards.   He returns for followup of CHF, atrial fibrillation, and CAD.  Rhythm today appears again to be atypical flutter. He notes dyspnea walking fast or walking up stairs.  This seems a little worse than in the past. He has not used Lasix.  No chest pain.  He has occasional episodes of unsteadiness/imbalance but no falls.  Weight is up 6 lbs. He reports that his HR sometimes jumps up > 100 bpm.   ECG (personally reviewed): Atypical atrial flutter, QTc 462  Labs (8/12): K 4.1, creatinine 1.2, LDL 91, HDL 53 Labs (8/14): K 4.6,  creatinine 1.1 Labs (11/14): K 4.6, creatinine 1.2, LDL 91, HDL 56 Labs (3/15): AST 38, ALT 32, TSH normal, BNP 237 Labs (7/15): LDL 96, HDL 48, K 4.2, creatinine 1.2, TSH normal, BNP 237, AST 38, ALT 32 Labs (8/15): LFTs normal Labs (11/15): K 4.2, creatinine 1.1, Mg 2.2, BNP 227 Labs (2/16): K 4, creatinine 1.32, BNP 261 Labs (6/18): K 3.9, creatinine 1.6 Labs (7/18): K 3.9, creatinine 1.5, BNP 242 Labs (12/18): K 3.8, creatinine 1.4, hgb 13.9, LDL 73, HDL 52 Labs (3/19): K 3.7, creatinine 1.4, LDL 76, HDL 42 Labs (6/19): K 4.1, creatinine 1.63 Labs (3/20): K 4.2, creatinine 1.29, LDL 53, HDL 47, hgb 13.8 Labs (4/20): K 4.3, creatinine 1.5 Labs (6/20): K 4.2, creatinine 1.84, hgb 12.4 Labs (7/20): K 4.5, creatinine 2.53 => 1.72 Labs (8/20): K 4.7, creatinine 1.5, Mg 2.4 Labs (12/20): LDL 79, K 4.9, creatinine 1.4 Labs (2/21): K 4.5, creatinine 1.56 Labs (8/21): K 4, creatinine 1.4, LDL 69, HDL 52 Labs (12/21): K 4.4, creatinine 1.21, LDL 85 Labs (3/22): K 4.1, creatinine 1.6, hgb 13.3, BNP 197 Labs (7/22): creatinine 1.72 Labs (8/22): LDL 74, HDL 50, BNP 307, K 4.5, creatinine 1.47 Labs (12/22): LDL 74, HDL 49, K 4.1, creatinine 1.5 Labs (2/23): BNP 166, K 5.2, creatinine 1.33 Labs (5/23): K 3.8, creatinine 1.32 Labs (8/23): LDL 71, K 4.1, creatinine 3.24 Labs (12/23): K 4, creatinine 1.33 Labs (6/24): K 4.5, creatinine 1.33, LDL 79, BNP 160 Labs (11/24): K 4.5, creatinine 1.21, LDL 77 Labs (1/25): LDL 95, LFTs normal, K 4.9, creatinine 1.2, hgb 16.7   PMH: 1. CAD: 1st MI in 1988 (while [place],000 feet on hiking trek at [employer]).  CABG 1996.  PCI to CFX in 2009.  LHC (11/09) SVG-dRCA patent, total occlusion RCA, patent CFX stent, atretic LIMA, serial 60 and 80% proximal LAD stenoses, EF 60% with basal inferior hypokinesis.  Myoview in 2011 with no ischemia or infarction.  Echo (10/12) with EF 55-60%, mild LVH, mild MR.  Lexiscan Cardiolite in 2013 with no ischemia or  infarction.  Echo (8/14) with EF 55-60%, moderate MR, moderate TR, PA systolic pressure 35 mmHg.  Echo (4/15) with EF 60-65%, mild focal basal septal hypertrophy, inferior basal akinesis, mild MR.  Lexiscan cardiolite (11/15) with EF 61%, fixed basal inferoseptal defect, no ischemia.  - Lexiscan Cardiolite (6/18): EF 54%, no perfusion defect.  - NSTEMI 3/20, cath showed 95% ostial/proximal LAD, 60-70% mid LAD, totally occluded SVG-LCx, totally occluded RCA, 80-90% mid SVG-RCA.  Patient had DES to ostial-mid LAD and DES to SVG-RCA.  2. Raynauds syndrome 3. Post-polio syndrome 4. GERD with dilation of esophageal stricture in 12/12.  5. Hyperlipidemia 6. H/o CVAs: Brainstem stroke with Horner's syndrome.  Recurrent stroke in 10/20 with right hand weakness when off anticoagulation for 2 days.  -  Carotid dopplers (10/20) with minimal stenosis.  7. Scoliosis. 8. H/o appendectomy 9. Herpes Zoster 10. Atrial fibrillation/atypical atrial flutter: DCCV to NSR in 8/14. DCCV to NSR in 1/15. Atrial fibrillation ablation 6/15 with Tikosyn loading (at [facility]).   - Redo atrial fibrillation ablation in 6/20.  Patient also noted to have complex flutter with several foci, not ablated.  - Zio patch (7/20): Primarily NSR with a few short SVT runs.  - DCCV 11/20.  - DCCV 7/21 - Redo atrial fibrillation ablation 11/21.  - DCCV 1/22 - DCCV 10/24 11. PFTs (4/15) with FVC 59%, FEV1 54%, ratio 91%, DLCO 53% => moderate obstructive defect thought to be related to COPD and severe restrictive defect thought to be due to elevated left hemidiaphragm and post-polio syndrome.  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  Echo (5/18) with EF 65-70%, RV moderately dilated with normal systolic function, PASP 57 mmHg.  - Echo (3/20): EF 45-50%, mild LV dilation, basal inferolateral and inferior hypokinesis.  - Echo (10/20): EF 50-55%, mild LVH, basal inferior and inferoseptal hypokinesis, moderately decreased RV systolic function, small PFO.  -  Echo (1/22): EF 60-65%, normal RV, mild-moderate MR.  - Echo (8/23): EF 55-60%, grade 2 diastolic dysfunction, mild RV dilation and mild RV dysfunction, moderate LAE, mild-moderate MR, PASP 41 mmHg.  13. CKD: Stage 3.  14. OSA: dental appliance.  15. Right radial artery pseudoaneurysm: post-cath in 3/20, s/p repair.  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 H/o esophageal stricture  Current Outpatient Medications  Medication Sig Dispense Refill   acetaminophen (TYLENOL) 500 MG tablet Take 250-500 mg by mouth every 8 (eight) hours as needed for mild pain or moderate pain.      albuterol (PROAIR HFA) 108 (90 BASE) MCG/ACT inhaler Inhale 2 puffs into the lungs every 4 (four) hours as needed for wheezing or shortness of breath (and prior to exercise). 1 Inhaler 3   Cholecalciferol (VITAMIN D3) 50 MCG (2000 UT) CAPS Take 2,000 Units by mouth every evening.     dicyclomine (BENTYL) 10 MG capsule TAKE ONE CAPSULE BY MOUTH 3 TIMES DAILY AS NEEDED FOR SPASMS 270 capsule 3   diltiazem (CARDIZEM CD) 240 MG 24 hr capsule TAKE 1 CAPSULE BY MOUTH EVERYDAY AT BEDTIME 90 capsule 3   dofetilide (TIKOSYN) 250 MCG capsule TAKE 1 CAPSULE BY MOUTH EVERY 12 HOURS 180 capsule 3   ELIQUIS 2.5 MG TABS tablet TAKE 1 TABLET BY MOUTH TWICE A DAY 180 tablet 3   ezetimibe (ZETIA) 10 MG tablet TAKE 1 TABLET BY MOUTH EVERY DAY 90 tablet 3   famotidine (PEPCID) 40 MG tablet TAKE 1 TABLET BY MOUTH EVERYDAY AT BEDTIME 90 tablet 0   JARDIANCE 10 MG TABS tablet TAKE 1 TABLET BY MOUTH EVERY DAY BEFORE BREAKFAST 90 tablet 3   lactobacillus acidophilus & bulgar (LACTINEX) chewable tablet Chew 2 tablets by mouth at bedtime.     loperamide (IMODIUM) 2 MG capsule Take 2 mg by mouth as needed for diarrhea or loose stools.     LORazepam (ATIVAN) 0.5 MG tablet Take 0.5 mg by mouth at bedtime.     methocarbamol (ROBAXIN) 500 MG tablet Take 500 mg by mouth 3 (three) times daily as needed for muscle spasms.      nitroGLYCERIN (NITROSTAT) 0.4 MG  SL tablet Place 1 tablet (0.4 mg total) under the tongue every 5 (five) minutes as needed for chest pain. 25 tablet 0   Probiotic Product (ALIGN) 4 MG CAPS Take 4 mg by mouth daily. (0800)  rosuvastatin (CRESTOR) 40 MG tablet TAKE 1 TABLET BY MOUTH EVERY DAY 90 tablet 3   tadalafil (CIALIS) 10 MG tablet Take 10 mg by mouth daily in the afternoon. Troche* for circulation     Testosterone POWD Apply 100 mg topically daily. Testosterone (AT) 150/0(150mg ) cpd     traZODone (DESYREL) 50 MG tablet Take 50 mg by mouth at bedtime.     furosemide (LASIX) 20 MG tablet Take 1 tablet (20 mg total) by mouth daily. 90 tablet 3   metoprolol succinate (TOPROL XL) 25 MG 24 hr tablet Take 1 tablet (25 mg total) by mouth daily. 90 tablet 3   No current facility-administered medications for this encounter.    Allergies:   Morphine and codeine and Tape   Social History:  The patient  reports that he quit smoking about 57 years ago. His smoking use included cigarettes. He started smoking about 62 years ago. He has a 2.5 pack-year smoking history. He quit smokeless tobacco use about 75 years ago. He reports that he does not drink alcohol and does not use drugs.   Family History:  The patient's family history includes Cancer in his son; Heart Problems in his brother; Heart disease in his father, maternal grandfather, maternal grandmother, mother, paternal grandfather, and paternal grandmother; Prostate cancer in his paternal uncle.   ROS:  Please see the history of present illness.   All other systems are personally reviewed and negative.   Exam:   BP 130/64   Pulse 89   Wt 54.3 kg (119 lb 9.6 oz)   SpO2 98%   BMI 19.30 kg/m  General: NAD Neck: JVP 8-9 cm, no thyromegaly or thyroid nodule.  Lungs: Decreased BS left base.  CV: Nondisplaced PMI.  Heart irregular S1/S2, no S3/S4, no murmur.  No peripheral edema.  No carotid bruit.  Normal pedal pulses.  Abdomen: Soft, nontender, no hepatosplenomegaly, no  distention.  Skin: Intact without lesions or rashes.  Neurologic: Alert and oriented x 3.  Psych: Normal affect. Extremities: No clubbing or cyanosis.  HEENT: Normal.   Recent Labs: 06/23/2023: Platelets 154 09/30/2023: Hemoglobin 17.0 11/09/2023: Magnesium 2.3 02/13/2024: B Natriuretic Peptide 154.1; BUN 16; Creatinine, Ser 1.25; Potassium 4.3; Sodium 138  Personally reviewed   Wt Readings from Last 3 Encounters:  02/13/24 54.3 kg (119 lb 9.6 oz)  11/09/23 51.3 kg (113 lb)  10/19/23 51.8 kg (114 lb 3.2 oz)   ASSESSMENT AND PLAN:  1. Atrial fibrillation/flutter: He has been on Tikosyn and is s/p atrial fibrillation ablation at [facility] on 06/25/14.  He has also had atypical atrial flutter. He had a redo atrial fibrillation ablation in 6/20.  Complex flutter was noted from several foci and was not ablated.  Zio patch from 7/20 showed predominantly NSR with short runs SVT.  DCCV in 11/20, DCCV again in 7/21 and in 1/22. Redo atrial fibrillation ablation in 11/21.  He has significant sinus node disease/sick sinus syndrome and may eventually require pacemaker.  Today, he is again in atypical flutter with 2:1 block after failed DCCV in 10/24.  HR at times > 100 bpm.  He has noted increased dyspnea.  - We again discussed transition to amiodarone to see if this would work better for maintenance of NSR. He is not sure that he wants to switch to amiodarone given possible side effects. Also, I am not sure that amiodarone would work any better for him than Tikosyn to keep him in rhythm.  I would not cardiovert him again  unless he transitions to amiodarone.  - At this point, Tikosyn is not working to keep him in NSR.  I think he can stop it.  - Continue apixaban, this is appropriately dosed at 2.5 mg bid with age and low weight.   - Can continue current diltiazem CD.  - With elevated HR at times, I will add Toprol XL 25 mg daily.  2. CAD: H/o CABG.  He had NSTEMI in 3/20 with DES to ostial/proximal LAD and  SVG-RCA.  No chest pain.  - He will continue apixaban long-term for atrial fibrillation/flutter.  - He is on Crestor 40 mg daily and Zetia 10 mg daily.   3. Hyperlipidemia: He is taking Crestor 40 mg daily and Zetia 10 mg daily.  LDL has been above goal < 55.  - I will refer to lipid clinic to initiate Repatha.  4. Ischemic cardiomyopathy/primarily diastolic CHF: Echo in 3/20 with EF 45-50%, echo in 10/20 with EF 50-55%, echo in 1/22 with EF 60-65%.  Echo in 8/23 showed EF 55-60%, grade 2 diastolic dysfunction, mild RV dilation and mild RV dysfunction, moderate LAE, mild-moderate MR, PASP 41 mmHg.  NYHA class III.  Weight is up and he is mildly volume overloaded.   - Start Lasix 20 mg daily.  BMET/BNP today and BMET in 10 days.      - Continue Jardiance 10 mg daily. - He is off losartan with elevated creatinine.  5. Pulmonary: PFTs suggested both a restrictive and obstructive defect.  The restrictive defect is likely from post-polio syndrome and elevated left hemidiaphragm.  Mr Danford never smoked much, so the obstructive defect is more surprising.  6. OSA: Severe, likely potentiates his atrial fibrillation. Unable to tolerate CPAP or oral appliance.   7. CVA: 10/20, in setting of holding anticoagulation for 2 days.  Avoid holding anticoagulation in the future.  Mild residual effect on right hand dexterity.   Followup in 1 month with APP.    I spent 31 minutes reviewing data, interviewing patient, and organizing the orders/followup.   Signed, Marca Ancona, MD  02/13/2024   [facility] [address] [facility] [place] [address] [phone] (office) [phone] (fax)

## 2024-02-23 ENCOUNTER — Ambulatory Visit (HOSPITAL_COMMUNITY)
Admission: RE | Admit: 2024-02-23 | Discharge: 2024-02-23 | Disposition: A | Discharge status: Home / Self Care | Payer: Medicare Other | Source: Ambulatory Visit | Attending: Cardiology | Admitting: Cardiology

## 2024-02-23 DIAGNOSIS — I5032 Chronic diastolic (congestive) heart failure: Secondary | ICD-10-CM | POA: Insufficient documentation

## 2024-02-23 LAB — BASIC METABOLIC PANEL
Anion gap: 9 (ref 5–15)
BUN: 19 mg/dL (ref 8–23)
CO2: 28 mmol/L (ref 22–32)
Calcium: 10.2 mg/dL (ref 8.9–10.3)
Chloride: 101 mmol/L (ref 98–111)
Creatinine, Ser: 1.6 mg/dL — ABNORMAL HIGH (ref 0.61–1.24)
GFR, Estimated: 42 mL/min — ABNORMAL LOW (ref >60)
Glucose, Bld: 101 mg/dL — ABNORMAL HIGH (ref 70–99)
Potassium: 3.9 mmol/L (ref 3.5–5.1)
Sodium: 138 mmol/L (ref 135–145)

## 2024-02-27 ENCOUNTER — Telehealth (HOSPITAL_COMMUNITY): Payer: Self-pay

## 2024-02-27 DIAGNOSIS — I5032 Chronic diastolic (congestive) heart failure: Secondary | ICD-10-CM

## 2024-02-27 NOTE — Telephone Encounter (Signed)
 Received call from patient who states that he would like his most recent blood work faxed to his buisness fax machine- patient gives consent to do this. Advised patient this has been faxed over.   Patient also reports that he could not decrease his lasix when Dr. Shirlee Latch recommended it due to swelling- reports he had gained 4 pounds in 4 days and needs to take his lasix 20mg  daily. Patient would like to make Dr. Shirlee Latch aware- will forward this message to him.

## 2024-02-27 NOTE — Telephone Encounter (Signed)
 He can keep Lasix at 20 mg daily but need BMET later in the week.

## 2024-02-28 NOTE — Telephone Encounter (Signed)
 Returned call and made patient aware- he is scheduled for repeat labs on 3/7 here at the HF clinic.   Patient aware of appointment time and date and verbalized understanding of all instructions.   Advised patient to call back to office with any issues, questions, or concerns. Patient verbalized understanding.

## 2024-02-28 NOTE — Addendum Note (Signed)
 Addended by: Bea Laura B on: 02/28/2024 11:25 AM   Modules accepted: Orders

## 2024-03-02 ENCOUNTER — Ambulatory Visit (HOSPITAL_COMMUNITY)
Admission: RE | Admit: 2024-03-02 | Discharge: 2024-03-02 | Disposition: A | Discharge status: Home / Self Care | Source: Ambulatory Visit | Attending: Internal Medicine | Admitting: Internal Medicine

## 2024-03-02 DIAGNOSIS — I5032 Chronic diastolic (congestive) heart failure: Secondary | ICD-10-CM | POA: Diagnosis present

## 2024-03-02 LAB — BASIC METABOLIC PANEL
Anion gap: 7 (ref 5–15)
BUN: 21 mg/dL (ref 8–23)
CO2: 26 mmol/L (ref 22–32)
Calcium: 9.9 mg/dL (ref 8.9–10.3)
Chloride: 106 mmol/L (ref 98–111)
Creatinine, Ser: 1.61 mg/dL — ABNORMAL HIGH (ref 0.61–1.24)
GFR, Estimated: 42 mL/min — ABNORMAL LOW (ref >60)
Glucose, Bld: 116 mg/dL — ABNORMAL HIGH (ref 70–99)
Potassium: 4.5 mmol/L (ref 3.5–5.1)
Sodium: 139 mmol/L (ref 135–145)

## 2024-03-05 ENCOUNTER — Telehealth (HOSPITAL_COMMUNITY): Payer: Self-pay | Admitting: *Deleted

## 2024-03-05 NOTE — Telephone Encounter (Signed)
 Called patient per Dr. Shirlee Latch with following lab results:  "No changes, creatinine still mildly elevated from baseline but not higher."  Wife verbalized understanding of same.

## 2024-03-06 ENCOUNTER — Telehealth (HOSPITAL_COMMUNITY): Payer: Self-pay

## 2024-03-06 NOTE — Telephone Encounter (Signed)
 Per Dr. Shirlee Latch "I spoke with Dr. Elberta Fortis, would be ok for him to stop Tikosyn as it is not keeping him in NSR. "  Left message fro patient to call the office

## 2024-03-07 NOTE — Telephone Encounter (Signed)
Pt/pts wife aware 

## 2024-03-09 NOTE — Progress Notes (Signed)
 [facility] PCP:  Geoffry Paradise, MD Cardiologist:  Dr. Shirlee Latch   HPI: [name] is a 88 y.o. male who has history of CAD s/p CABG.  Cath in 11/09 showed that the SVG-distal RCA was patent, the CFX system was patent.  His LIMA was atretic and there were serial 60% and 80% stenoses in the native LAD. He was managed medically.  Echo in 8/14 showed EF 55-60% with moderate MR and moderate TR.    He was initially noted to have atrial fibrillation in the summer of 2014. He was started on Xarelto and cardioverted to NSR in 8/14.  Recurrent atrial fibrillation was noted in 1/15, and he was cardioverted to NSR again.  This time, NSR did not hold long. By 5/15, he was in persistent atrial fibrillation.  I referred him to [facility] where he had atrial fibrillation ablation and Tikosyn loading.  Ranolazine was stopped due to risk of QT prolongation and Imdur was started as an anti-anginal instead.  Lexiscan Cardiolite in 11/15 showed no ischemia.     CTA chest done in 2/16 to look for evidence for PV stenosis post-AF ablation.  This showed mild short-segment narrowing of the left inferior pulmonary vein.    Patient was admitted in 5/18 with acute on chronic diastolic CHF.  He had been [place] the beach for [place] days and ate out a number of times, probably getting a significant sodium load.  No chest pain.  He was in normal sinus rhythm.  He was started on IV Lasix and diuresed.  Echo in 5/18 showed EF 65-70% with moderately dilated RV and normal systolic function, PASP 57 mmHg.  Lexiscan Cardiolite in 6/18 showed no significant perfusion defect.    He was admitted with NSTEMI in 3/20, had DES to ostial LAD and mid SVG-RCA.  Echo showed EF 45-50%.  He was in atypical atrial flutter persistently despite Tikosyn.  After his intervention, he was noted to have a radial artery pseudoaneurysm that was repaired by Dr. Myra Gianotti.   In 6/20, he had a redo atrial fibrillation ablation.  He was also  noted to have complex flutter from multiple foci that was not ablated.  After the procedure, he has had considerable pain in the right leg at the cath site but also radiating down the leg, groin US showed no AV fistula or pseudoaneurysm.    In 10/20, patient held anticoagulation for 2 days for oral surgery and had a CVA presenting as right hand weakness.  He did not get tPA.  Echo in 10/20 showed EF 50-55%, mild LVH, moderately decreased RV function, small PFO. Carotid dopplers showed 1-39% bilateral stenosis. He was switched from Xarelto to Eliquis.   In 11/20, he went into persistent atrial fibrillation and underwent DCCV.  In 7/21, he went into atypical flutter and had DCCV to NSR.  He had a repeat atrial fibrillation ablation in 11/21.   Echo in 1/22 showed EF 60-65%, normal RV, mild-moderate MR. DCCV to NSR in 1/22.   Given significant weight loss, patient had CT chest/abdomen/pelvis in 7/22, this showed no worrisome abnormalities.   Echo in 8/23 showed EF 55-60%, grade 2 diastolic dysfunction, mild RV dilation and mild RV dysfunction, moderate LAE, mild-moderate MR, PASP 41 mmHg.   Patient had DCCV for atypical atrial flutter in 10/24 but had return of atypical flutter shortly afterwards.   Follow up 1/25, he was in atypical flutter. Discussed stopping Tikosyn and starting amiodarone to see  if this would result in better maintenance of NSR, however patient declined. Low dose Toprol added for better rate control  Today he returns for HF follow up with his wife. Overall feeling "good and bad." On bad days, he feels unsteady, SOB, and fatigued. He does not feel when he is in AF/AFL. No SOB walking on flat ground. Frequently dizzy with positional changes, no recent falls. Ankles swell, better in the AM. Denies palpitations, abnormal bleeding, CP, or PND/Orthopnea. Appetite ok. No fever or chills. Weight at home 108-113 pounds. Taking all medications. 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 Does not drink much fluid during the day.   ReDs reading: 16%, abnormal  ECG (personally reviewed): NSR, 1AVB PR 232 msec, QTc 452 msec  Labs (6/24): K 4.5, creatinine 1.33, LDL 79, BNP 160 Labs (11/24): K 4.5, creatinine 1.21, LDL 77 Labs (1/25): LDL 95, LFTs normal, K 4.9, creatinine 1.2, hgb 16.7 Labs (3/25): K 4.5, creatinine 1.61   PMH: 1. CAD: 1st MI in 1988 (while [place] 11,000 feet on hiking trek at [employer]).  CABG 1996.  PCI to CFX in 2009.  LHC (11/09) SVG-dRCA patent, total occlusion RCA, patent CFX stent, atretic LIMA, serial 60 and 80% proximal LAD stenoses, EF 60% with basal inferior hypokinesis.  Myoview in 2011 with no ischemia or infarction.  Echo (10/12) with EF 55-60%, mild LVH, mild MR.  Lexiscan Cardiolite in 2013 with no ischemia or infarction.  Echo (8/14) with EF 55-60%, moderate MR, moderate TR, PA systolic pressure 35 mmHg.  Echo (4/15) with EF 60-65%, mild focal basal septal hypertrophy, inferior basal akinesis, mild MR.  Lexiscan cardiolite (11/15) with EF 61%, fixed basal inferoseptal defect, no ischemia.  - Lexiscan Cardiolite (6/18): EF 54%, no perfusion defect.  - NSTEMI 3/20, cath showed 95% ostial/proximal LAD, 60-70% mid LAD, totally occluded SVG-LCx, totally occluded RCA, 80-90% mid SVG-RCA.  Patient had DES to ostial-mid LAD and DES to SVG-RCA.  2. Raynauds syndrome 3. Post-polio syndrome 4. GERD with dilation of esophageal stricture in 12/12.  5. Hyperlipidemia 6. H/o CVAs: Brainstem stroke with Horner's syndrome.  Recurrent stroke in 10/20 with right hand weakness when off anticoagulation for 2 days.  - Carotid dopplers (10/20) with minimal stenosis.  7. Scoliosis. 8. H/o appendectomy 9. Herpes Zoster 10. Atrial fibrillation/atypical atrial flutter: DCCV to NSR in 8/14. DCCV to NSR in 1/15. Atrial fibrillation ablation 6/15 with Tikosyn loading (at [facility]).   - Redo atrial fibrillation ablation in 6/20.  Patient also noted to  have complex flutter with several foci, not ablated.  - Zio patch (7/20): Primarily NSR with a few short SVT runs.  - DCCV 11/20.  - DCCV 7/21 - Redo atrial fibrillation ablation 11/21.  - DCCV 1/22 - DCCV 10/24 11. PFTs (4/15) with FVC 59%, FEV1 54%, ratio 91%, DLCO 53% => moderate obstructive defect thought to be related to COPD and severe restrictive defect thought to be due to elevated left hemidiaphragm and post-polio syndrome.  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  Echo (5/18) with EF 65-70%, RV moderately dilated with normal systolic function, PASP 57 mmHg.  - Echo (3/20): EF 45-50%, mild LV dilation, basal inferolateral and inferior hypokinesis.  - Echo (10/20): EF 50-55%, mild LVH, basal inferior and inferoseptal hypokinesis, moderately decreased RV systolic function, small PFO.  - Echo (1/22): EF 60-65%, normal RV, mild-moderate MR.  - Echo (8/23): EF 55-60%, grade 2 diastolic dysfunction, mild RV dilation and mild RV dysfunction, moderate  LAE, mild-moderate MR, PASP 41 mmHg.  13. CKD: Stage 3.  14. OSA: dental appliance.  15. Right radial artery pseudoaneurysm: post-cath in 3/20, s/p repair.  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 H/o esophageal stricture  Current Outpatient Medications  Medication Sig Dispense Refill   acetaminophen (TYLENOL) 500 MG tablet Take 250-500 mg by mouth every 8 (eight) hours as needed for mild pain or moderate pain.      albuterol (PROAIR HFA) 108 (90 BASE) MCG/ACT inhaler Inhale 2 puffs into the lungs every 4 (four) hours as needed for wheezing or shortness of breath (and prior to exercise). 1 Inhaler 3   Cholecalciferol (VITAMIN D3) 50 MCG (2000 UT) CAPS Take 2,000 Units by mouth every evening.     dicyclomine (BENTYL) 10 MG capsule TAKE ONE CAPSULE BY MOUTH 3 TIMES DAILY AS NEEDED FOR SPASMS 270 capsule 3   diltiazem (CARDIZEM CD) 240 MG 24 hr capsule TAKE 1 CAPSULE BY MOUTH EVERYDAY AT BEDTIME 90 capsule 3   dofetilide (TIKOSYN) 250 MCG capsule TAKE 1  CAPSULE BY MOUTH EVERY 12 HOURS 180 capsule 3   ELIQUIS 2.5 MG TABS tablet TAKE 1 TABLET BY MOUTH TWICE A DAY 180 tablet 3   ezetimibe (ZETIA) 10 MG tablet TAKE 1 TABLET BY MOUTH EVERY DAY 90 tablet 3   famotidine (PEPCID) 40 MG tablet TAKE 1 TABLET BY MOUTH EVERYDAY AT BEDTIME 90 tablet 0   furosemide (LASIX) 20 MG tablet Take 1 tablet (20 mg total) by mouth daily. 90 tablet 3   JARDIANCE 10 MG TABS tablet TAKE 1 TABLET BY MOUTH EVERY DAY BEFORE BREAKFAST 90 tablet 3   lactobacillus acidophilus & bulgar (LACTINEX) chewable tablet Chew 2 tablets by mouth at bedtime.     loperamide (IMODIUM) 2 MG capsule Take 2 mg by mouth as needed for diarrhea or loose stools.     LORazepam (ATIVAN) 0.5 MG tablet Take 0.5 mg by mouth at bedtime.     methocarbamol (ROBAXIN) 500 MG tablet Take 500 mg by mouth 3 (three) times daily as needed for muscle spasms.      metoprolol succinate (TOPROL XL) 25 MG 24 hr tablet Take 1 tablet (25 mg total) by mouth daily. 90 tablet 3   nitroGLYCERIN (NITROSTAT) 0.4 MG SL tablet Place 1 tablet (0.4 mg total) under the tongue every 5 (five) minutes as needed for chest pain. 25 tablet 0   Probiotic Product (ALIGN) 4 MG CAPS Take 4 mg by mouth daily. (0800)     rosuvastatin (CRESTOR) 40 MG tablet TAKE 1 TABLET BY MOUTH EVERY DAY 90 tablet 3   tadalafil (CIALIS) 10 MG tablet Take 10 mg by mouth daily in the afternoon. Troche* for circulation     Testosterone POWD Apply 100 mg topically daily. Testosterone (AT) 150/0(150mg ) cpd     traZODone (DESYREL) 50 MG tablet Take 50 mg by mouth at bedtime.     No current facility-administered medications for this encounter.    Allergies:   Morphine and codeine and Tape   Social History:  The patient  reports that he quit smoking about 57 years ago. His smoking use included cigarettes. He started smoking about 62 years ago. He has a 2.5 pack-year smoking history. He quit smokeless tobacco use about 75 years ago. He reports that he does not  drink alcohol and does not use drugs.   Family History:  The patient's family history includes Cancer in his son; Heart Problems in his brother; Heart disease in his father, maternal  grandfather, maternal grandmother, mother, paternal grandfather, and paternal grandmother; Prostate cancer in his paternal uncle.   ROS:  Please see the history of present illness.   All other systems are personally reviewed and negative.   Wt Readings from Last 3 Encounters:  03/12/24 54 kg (119 lb)  02/13/24 54.3 kg (119 lb 9.6 oz)  11/09/23 51.3 kg (113 lb)   BP (!) 128/56   Pulse 74   Wt 54 kg (119 lb)   SpO2 94%   BMI 19.21 kg/m   PHYSICAL EXAM:   General:  NAD. No resp difficulty, walked into clinic, elderly HEENT: Normal Neck: Supple. No JVD. Cor: Regular rate & rhythm. No rubs, gallops or murmurs. Lungs: Clear, LLL diminished Abdomen: Soft, nontender, nondistended.  Extremities: No cyanosis, clubbing, rash, edema; + varcoscities  Neuro: Alert & oriented x 3, moves all 4 extremities w/o difficulty. Affect pleasant.  ASSESSMENT AND PLAN:  1. Atrial fibrillation/flutter: He has been on Tikosyn and is s/p atrial fibrillation ablation at [facility] on 06/25/14.  He has also had atypical atrial flutter. He had a redo atrial fibrillation ablation in 6/20.  Complex flutter was noted from several foci and was not ablated.  Zio patch from 7/20 showed predominantly NSR with short runs SVT.  DCCV in 11/20, DCCV again in 7/21 and in 1/22. Redo atrial fibrillation ablation in 11/21.  He has significant sinus node disease/sick sinus syndrome and may eventually require pacemaker.  At follow up 02/13/24, he was in atypical flutter with 2:1 block after failed DCCV in 10/24.  HR at times > 100 bpm.  He had noted increased dyspnea. Dr. Shirlee Latch discussed transition to amiodarone and stopping Tikosyn, to see if this would result in better maintenance of NSR, however patient declined due to potential side effects.  - Would not  cardiovert him again unless he transitions to amiodarone.  - Today, he is in NSR on ECG - Continue Tikosyn, QTc 452 msec - Continue apixaban, this is appropriately dosed at 2.5 mg bid with age and low weight.   - Continue current diltiazem CD.  - Continue Toprol XL 25 mg daily.  2. CAD: H/o CABG.  He had NSTEMI in 3/20 with DES to ostial/proximal LAD and SVG-RCA.  No chest pain.  - He will continue apixaban long-term for atrial fibrillation/flutter.  - He is on Crestor 40 mg daily and Zetia 10 mg daily.   3. Hyperlipidemia: He is taking Crestor 40 mg daily and Zetia 10 mg daily.  LDL has been above goal < 55.  - He has been referred to Lipid clinic to initiate Repatha and has an appt next month.  4. Ischemic cardiomyopathy/primarily diastolic CHF: Echo in 3/20 with EF 45-50%, echo in 10/20 with EF 50-55%, echo in 1/22 with EF 60-65%.  Echo in 8/23 showed EF 55-60%, grade 2 diastolic dysfunction, mild RV dilation and mild RV dysfunction, moderate LAE, mild-moderate MR, PASP 41 mmHg.  NYHA class III.  Suspect functional class confounded by post-polio restrictive lung defects resulting in left hemidiaphragm. He appears mildly hypovolemic today with what sounds like orthostatic symptoms. ReDs low at 16% (? If reading confounded by scoliosis). - Place compression hose - Decrease Lasix 20 mg to 2x/week. Most recent labs showed SCr up to 1.6. Repeat BMET and BNP in 10-14 days. - Continue Jardiance 10 mg daily. - He is off losartan with elevated creatinine.  5. Pulmonary: PFTs suggested both a restrictive and obstructive defect.  The restrictive defect is likely from  post-polio syndrome and elevated left hemidiaphragm.  Mr Coleman never smoked much, so the obstructive defect is more surprising.  6. OSA: Severe, likely potentiates his atrial fibrillation.  - Unable to tolerate CPAP or oral appliance.   7. CVA: 10/20, in setting of holding anticoagulation for 2 days.  Avoid holding anticoagulation in the  future.  Mild residual effect on right hand dexterity.   Follow up in 3 months with Dr. Shirlee Latch  Signed, Jacklynn Ganong, FNP  03/12/2024   [facility] [address] [facility] [place] [address] [phone] (office) [phone] (fax)

## 2024-03-12 ENCOUNTER — Ambulatory Visit (HOSPITAL_COMMUNITY)
Admission: RE | Admit: 2024-03-12 | Discharge: 2024-03-12 | Disposition: A | Discharge status: Home / Self Care | Payer: Medicare Other | Source: Ambulatory Visit | Attending: Family Medicine

## 2024-03-12 ENCOUNTER — Encounter (HOSPITAL_COMMUNITY): Payer: Self-pay

## 2024-03-12 VITALS — BP 128/56 | HR 74 | Wt 119.0 lb

## 2024-03-12 DIAGNOSIS — I110 Hypertensive heart disease with heart failure: Secondary | ICD-10-CM | POA: Insufficient documentation

## 2024-03-12 DIAGNOSIS — G4733 Obstructive sleep apnea (adult) (pediatric): Secondary | ICD-10-CM | POA: Insufficient documentation

## 2024-03-12 DIAGNOSIS — Z955 Presence of coronary angioplasty implant and graft: Secondary | ICD-10-CM | POA: Insufficient documentation

## 2024-03-12 DIAGNOSIS — Z951 Presence of aortocoronary bypass graft: Secondary | ICD-10-CM | POA: Insufficient documentation

## 2024-03-12 DIAGNOSIS — Z7984 Long term (current) use of oral hypoglycemic drugs: Secondary | ICD-10-CM | POA: Insufficient documentation

## 2024-03-12 DIAGNOSIS — E785 Hyperlipidemia, unspecified: Secondary | ICD-10-CM | POA: Insufficient documentation

## 2024-03-12 DIAGNOSIS — I252 Old myocardial infarction: Secondary | ICD-10-CM | POA: Diagnosis not present

## 2024-03-12 DIAGNOSIS — I5032 Chronic diastolic (congestive) heart failure: Secondary | ICD-10-CM | POA: Diagnosis not present

## 2024-03-12 DIAGNOSIS — I255 Ischemic cardiomyopathy: Secondary | ICD-10-CM | POA: Diagnosis not present

## 2024-03-12 DIAGNOSIS — Z8673 Personal history of transient ischemic attack (TIA), and cerebral infarction without residual deficits: Secondary | ICD-10-CM | POA: Diagnosis not present

## 2024-03-12 DIAGNOSIS — I2510 Atherosclerotic heart disease of native coronary artery without angina pectoris: Secondary | ICD-10-CM | POA: Insufficient documentation

## 2024-03-12 DIAGNOSIS — Z7901 Long term (current) use of anticoagulants: Secondary | ICD-10-CM | POA: Insufficient documentation

## 2024-03-12 DIAGNOSIS — I484 Atypical atrial flutter: Secondary | ICD-10-CM | POA: Insufficient documentation

## 2024-03-12 DIAGNOSIS — E7849 Other hyperlipidemia: Secondary | ICD-10-CM

## 2024-03-12 DIAGNOSIS — Z87891 Personal history of nicotine dependence: Secondary | ICD-10-CM | POA: Diagnosis not present

## 2024-03-12 DIAGNOSIS — I495 Sick sinus syndrome: Secondary | ICD-10-CM | POA: Insufficient documentation

## 2024-03-12 DIAGNOSIS — Z79899 Other long term (current) drug therapy: Secondary | ICD-10-CM | POA: Insufficient documentation

## 2024-03-12 DIAGNOSIS — I639 Cerebral infarction, unspecified: Secondary | ICD-10-CM

## 2024-03-12 DIAGNOSIS — I4819 Other persistent atrial fibrillation: Secondary | ICD-10-CM | POA: Diagnosis not present

## 2024-03-12 MED ORDER — FUROSEMIDE 20 MG PO TABS: 20.0000 mg | ORAL_TABLET | ORAL | 4 refills | Status: AC

## 2024-03-12 NOTE — Progress Notes (Signed)
   ReDS Vest / Clip - 03/12/24 1100       ReDS Vest / Clip   Station Marker D    Ruler Value 20    ReDS Value Range Low volume    ReDS Actual Value 16

## 2024-03-12 NOTE — Patient Instructions (Addendum)
 Thank you for coming in today  If you had labs drawn today, any labs that are abnormal the clinic will call you No news is good news  You have been given a prescription for compression hose. And a list of places who supply them. Please wear your compression hose daily, place them on as soon as you get up in the morning and remove before you go to bed at night.   Medications: Change Lasix to 20 mg 2 times weekly  Follow up appointments: Your physician recommends that you return for lab work in:  2 weeks for BMET/BNP  Your physician recommends that you schedule a follow-up appointment in:  3 months With Dr. Shirlee Latch    Do the following things EVERYDAY: Weigh yourself in the morning before breakfast. Write it down and keep it in a log. Take your medicines as prescribed Eat low salt foods--Limit salt (sodium) to 2000 mg per day.  Stay as active as you can everyday Limit all fluids for the day to less than 2 liters   At the [facility], you and your health needs are our priority. As part of our continuing mission to provide you with exceptional heart care, we have created designated Provider Care Teams. These Care Teams include your primary Cardiologist (physician) and Advanced Practice Providers (APPs- Physician Assistants and Nurse Practitioners) who all work together to provide you with the care you need, when you need it.   You may see any of the following providers on your designated Care Team at your next follow up: Dr Arvilla Meres Dr Marca Ancona Dr. Marcos Eke, NP Robbie Lis, [place] [facility] [place], [place] Brynda Peon, NP Karle Plumber, PharmD   Please be sure to bring in all your medications bottles to every appointment.    Thank you for choosing [facility]  If you have any questions or concerns before your next appointment please send [place] a message through [place] or call our  office at [phone].    TO LEAVE A MESSAGE FOR THE NURSE SELECT OPTION 2, PLEASE LEAVE A MESSAGE INCLUDING: YOUR NAME DATE OF BIRTH CALL BACK NUMBER REASON FOR CALL**this is important as we prioritize the call backs  YOU WILL RECEIVE A CALL BACK THE SAME DAY AS LONG AS YOU CALL BEFORE 4:00 PM

## 2024-03-26 ENCOUNTER — Ambulatory Visit (HOSPITAL_COMMUNITY)
Admission: RE | Admit: 2024-03-26 | Discharge: 2024-03-26 | Disposition: A | Discharge status: Home / Self Care | Source: Ambulatory Visit | Attending: Internal Medicine | Admitting: Internal Medicine

## 2024-03-26 DIAGNOSIS — I5032 Chronic diastolic (congestive) heart failure: Secondary | ICD-10-CM | POA: Diagnosis present

## 2024-03-26 LAB — BASIC METABOLIC PANEL WITH GFR
Anion gap: 9 (ref 5–15)
BUN: 18 mg/dL (ref 8–23)
CO2: 26 mmol/L (ref 22–32)
Calcium: 10 mg/dL (ref 8.9–10.3)
Chloride: 104 mmol/L (ref 98–111)
Creatinine, Ser: 1.49 mg/dL — ABNORMAL HIGH (ref 0.61–1.24)
GFR, Estimated: 46 mL/min — ABNORMAL LOW (ref >60)
Glucose, Bld: 96 mg/dL (ref 70–99)
Potassium: 4.1 mmol/L (ref 3.5–5.1)
Sodium: 139 mmol/L (ref 135–145)

## 2024-03-26 LAB — BRAIN NATRIURETIC PEPTIDE: B Natriuretic Peptide: 178.6 pg/mL — ABNORMAL HIGH (ref 0.0–100.0)

## 2024-03-28 ENCOUNTER — Other Ambulatory Visit (HOSPITAL_COMMUNITY): Payer: Self-pay | Admitting: Cardiology

## 2024-04-09 ENCOUNTER — Encounter: Payer: Self-pay | Admitting: Cardiology

## 2024-04-09 ENCOUNTER — Ambulatory Visit: Payer: Medicare Other | Attending: Cardiology | Admitting: Cardiology

## 2024-04-09 VITALS — BP 138/62 | HR 84 | Ht 66.0 in | Wt 119.4 lb

## 2024-04-09 DIAGNOSIS — I4819 Other persistent atrial fibrillation: Secondary | ICD-10-CM

## 2024-04-09 DIAGNOSIS — I5022 Chronic systolic (congestive) heart failure: Secondary | ICD-10-CM

## 2024-04-09 DIAGNOSIS — G4733 Obstructive sleep apnea (adult) (pediatric): Secondary | ICD-10-CM

## 2024-04-09 DIAGNOSIS — Z79899 Other long term (current) drug therapy: Secondary | ICD-10-CM | POA: Diagnosis not present

## 2024-04-09 DIAGNOSIS — I2510 Atherosclerotic heart disease of native coronary artery without angina pectoris: Secondary | ICD-10-CM

## 2024-04-09 DIAGNOSIS — I10 Essential (primary) hypertension: Secondary | ICD-10-CM | POA: Diagnosis not present

## 2024-04-09 DIAGNOSIS — D6869 Other thrombophilia: Secondary | ICD-10-CM | POA: Diagnosis not present

## 2024-04-09 DIAGNOSIS — I484 Atypical atrial flutter: Secondary | ICD-10-CM

## 2024-04-09 MED ORDER — AMIODARONE HCL 200 MG PO TABS: 200.0000 mg | ORAL_TABLET | Freq: Two times a day (BID) | ORAL | 0 refills | Status: DC

## 2024-04-09 MED ORDER — AMIODARONE HCL 200 MG PO TABS: 200.0000 mg | ORAL_TABLET | Freq: Every day | ORAL | 1 refills | Status: DC

## 2024-04-09 NOTE — Progress Notes (Signed)
  Electrophysiology Office Note:   Date:  04/09/2024  ID:  ARIANNA DELSANTO, DOB August 19, 1936, MRN [id number]  Primary Cardiologist: None Primary Heart Failure: Peder Bourdon, MD Electrophysiologist: Lei Pump, MD      History of Present Illness:   LASHAUN KRAPF is a 88 y.o. male with h/o atrial fibrillation, coronary disease, COPD, CVA, sleep apnea, hypertension, hyperlipidemia seen today for routine electrophysiology followup.   Since last being seen in our clinic the patient reports significant fatigue and shortness of breath.  He has been feeling this way for a few months.  He feels that he has been in and out of his atrial arrhythmias.  He initially did not want to stop his dofetilide, but now he is inquiring about possible amiodarone load.  he denies chest pain, palpitations, dyspnea, PND, orthopnea, nausea, vomiting, dizziness, syncope, edema, weight gain, or early satiety.   Review of systems complete and found to be negative unless listed in HPI.   EP Information / Studies Reviewed:    EKG is not ordered today. EKG from 03/13/23 reviewed which showed sinus rhythm        Risk Assessment/Calculations:    CHA2DS2-VASc Score = 7   This indicates a 11.2% annual risk of stroke. The patient's score is based upon: CHF History: 0 HTN History: 1 Diabetes History: 1 Stroke History: 2 Vascular Disease History: 1 Age Score: 2 Gender Score: 0            Physical Exam:   VS:  There were no vitals taken for this visit.   Wt Readings from Last 3 Encounters:  03/12/24 119 lb (54 kg)  02/13/24 119 lb 9.6 oz (54.3 kg)  11/09/23 113 lb (51.3 kg)     GEN: Well nourished, well developed in no acute distress NECK: No JVD; No carotid bruits CARDIAC: Irregularly irregular rate and rhythm, no murmurs, rubs, gallops RESPIRATORY:  Clear to auscultation without rales, wheezing or rhonchi  ABDOMEN: Soft, non-tender, non-distended EXTREMITIES:  No edema; No deformity   ASSESSMENT  AND PLAN:    1.  Persistent atrial fibrillation/atypical atrial flutter: Currently on dofetilide.  Post ablation 11/19/2020.  He did go back into atrial flutter and is now post cardioversion.  He had multiple atrial flutter circuits noted at the time of ablation.  He has significant weakness and fatigue.  At this point, he feels that his dofetilide is no longer working.  Due to that, we Enes Wegener switch to amiodarone.  Zahirah Cheslock start 200 mg twice daily for a month followed by 200 mg daily.  Bertie Simien follow-up in A-fib clinic to discuss possible cardioversion.  2.  Secondary hypercoagulable state: Currently on Eliquis for atrial fibrillation  3.  High-risk medication monitoring: Currently on dofetilide.  Recent BMP stable.  Kindal Ponti check magnesium today.  4.  Coronary artery disease: Post CABG.  No current chest pain  5.  Hypertension: Well-controlled  6.  Obstructive sleep apnea: Unable to tolerate CPAP or oral device  Discussed with primary cardiology  Follow up with Afib Clinic in 4 weeks  Signed, Manroop Jakubowicz Cortland Ding, MD

## 2024-04-09 NOTE — Patient Instructions (Addendum)
 Medication Instructions:  Your physician has recommended you make the following change in your medication:   ** Stop Tikosyn  ** Begin Amiodarone 200mg  after 3 days of stopping Tikosyn - 1 tablet by mouth twice daily x 1 month then decrease to 1 tablet by mouth daily  *If you need a refill on your cardiac medications before your next appointment, please call your pharmacy*  Lab Work: None ordered.   If you have labs (blood work) drawn today and your tests are completely normal, you will receive your results only by: MyChart Message (if you have MyChart) OR A paper copy in the mail If you have any lab test that is abnormal or we need to change your treatment, we will call you to review the results.  Testing/Procedures: None ordered.   Follow-Up: At [facility], you and your health needs are our priority.  As part of our continuing mission to provide you with exceptional heart care, our providers are all part of one team.  This team includes your primary Cardiologist (physician) and Advanced Practice Providers or APPs (Physician Assistants and Nurse Practitioners) who all work together to provide you with the care you need, when you need it.  Your next appointment:   1 month with Afib Clinic      1st Floor: - Lobby - Registration  - Pharmacy  - Lab - Cafe  2nd Floor: - PV Lab - Diagnostic Testing (echo, CT, nuclear med)  3rd Floor: - Vacant  4th Floor: - TCTS (cardiothoracic surgery) - AFib Clinic - Structural Heart Clinic - Vascular Surgery  - Vascular Ultrasound  5th Floor: - HeartCare Cardiology (general and EP) - Clinical Pharmacy for coumadin, hypertension, lipid, weight-loss medications, and med management appointments    Valet parking services will be available as well.

## 2024-04-10 ENCOUNTER — Telehealth: Payer: Self-pay | Admitting: Gastroenterology

## 2024-04-10 MED ORDER — FAMOTIDINE 40 MG PO TABS: ORAL_TABLET | ORAL | 0 refills | Status: DC

## 2024-04-10 NOTE — Telephone Encounter (Signed)
 Inbound call from patient stating he needed a refill for Pepcid. Patient made an appointment for 5/9 at 10:00 with Deanna May and is requesting refill be sent. Please advise.

## 2024-04-10 NOTE — Telephone Encounter (Signed)
 Patient is scheduled for appt to see Deanna May, NP. Prescription sent to patient's pharmacy until scheduled appt.

## 2024-04-12 ENCOUNTER — Other Ambulatory Visit (HOSPITAL_COMMUNITY): Payer: Self-pay

## 2024-04-12 ENCOUNTER — Telehealth: Payer: Self-pay | Admitting: Pharmacist

## 2024-04-12 ENCOUNTER — Ambulatory Visit: Payer: Self-pay | Attending: Internal Medicine | Admitting: Pharmacist

## 2024-04-12 ENCOUNTER — Encounter: Payer: Self-pay | Admitting: Pharmacist

## 2024-04-12 ENCOUNTER — Telehealth: Payer: Self-pay | Admitting: Pharmacy Technician

## 2024-04-12 DIAGNOSIS — E7849 Other hyperlipidemia: Secondary | ICD-10-CM | POA: Diagnosis not present

## 2024-04-12 NOTE — Assessment & Plan Note (Addendum)
 Assessment:  LDL goal: < 70 mg/dl or <16 given hx of premature ASCVD, last LDLc 95 mg/dl (09/9603) Tolerates Zetia and high intensity statins well without any side effects  Follows heart healthy diet stays active at work and home  Discussed next potential options (PCSK-9 inhibitors, bempedoic acid and inclisiran); cost, dosing efficacy, side effects  Given hx of persona and family premature ASCVD will get Lpa checked today   Plan: Continue taking current medications (Crestor 40 mg daily and Zetia 10 mg daily ) Will apply for PA for PCSK9i; will inform patient upon approval  Lipid lab due in 2-3 months after starting PCSK9i

## 2024-04-12 NOTE — Patient Instructions (Addendum)
 Your Results:             Your most recent labs Goal  Total Cholesterol 174 < 200  Triglycerides 75 < 150  HDL (happy/good cholesterol) 64 > 40  LDL (lousy/bad cholesterol 95 < 55 or <70   Medication changes:  Crestor 40 mg daily and Zetia 10 mg daily We will start the process to get PCSK9i(Repatha or Praluent)  covered by your insurance.  Once the prior authorization is complete, we will call you to let you know and confirm pharmacy information.    Praluent is a cholesterol medication that improved your body's ability to get rid of "bad cholesterol" known as LDL. It can lower your LDL up to 60%. It is an injection that is given under the skin every 2 weeks. The most common side effects of Praluent include runny nose, symptoms of the common cold, rarely flu or flu-like symptoms, back/muscle pain in about 3-4% of the patients, and redness, pain, or bruising at the injection site.    Repatha is a cholesterol medication that improved your body's ability to get rid of "bad cholesterol" known as LDL. It can lower your LDL up to 60%! It is an injection that is given under the skin every 2 weeks. The medication often requires a prior authorization from your insurance company.  The most common side effects of Repatha include runny nose, symptoms of the common cold, rarely flu or flu-like symptoms, back/muscle pain in about 3-4% of the patients, and redness, pain, or bruising at the injection site.   Lab orders: We want to repeat labs after 2-3 months.  We will send you a lab order to remind you once we get closer to that time.

## 2024-04-12 NOTE — Telephone Encounter (Signed)
 Pharmacy Patient Advocate Encounter   Received notification from Pt Calls Messages that prior authorization for REPATHA is required/requested.   Insurance verification completed.   The patient is insured through [facility] ADVANTAGE/RX ADVANCE .   Per test claim: PA required; PA submitted to above mentioned insurance via CoverMyMeds Key/confirmation #/EOC [id number] Status is pending

## 2024-04-12 NOTE — Progress Notes (Signed)
 Patient ID: [name]                 DOB: 1936/07/02                    MRN: [id number]      HPI: [name] is a 88 y.o. male patient referred to lipid clinic by Dr.McLean. PMH is significant for CVA: 10/20,CAD: H/o CABG. He had NSTEMI in 3/20 with DES to ostial/proximal LAD and SVG-RCA,  atrial fibrilation, chronic diastolic HF   Patient presented with his wife for lipid clinic. 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 He tolerates Current cholesterol medication well without side effects.  He eats heart healthy diet does not have exercise regimen but stays active at work and home.  Reviewed options for lowering LDL cholesterol, including, PCSK-9 inhibitors, bempedoic acid and inclisiran.  Discussed mechanisms of action, dosing, side effects and potential decreases in LDL cholesterol.  Also reviewed cost information and potential options for patient assistance.  Current Medications: Crestor 40 mg daily and Zetia 10 mg daily  Intolerances: none  Risk Factors: first MI at age of 42, CVA: 10/20,CAD: H/o CABG. He had NSTEMI in 3/20 with DES to ostial/proximal LAD and SVG-RCA, family hx of premature ASCVD - MGM- died from MI at age 75 father - died from MI age 93 and mother - died from MI at age 51  LDL goal: <55 mg/dl  Last lab at PCP : 95/6213 TC 174, TG 75, HDL 64, LDL 95 Diet: low fat, low salt and low carb diet  Washes canned food good   Exercise: stay active and still work 5 hours 5 days a week   Family History:  Relation Problem Comments  Mother (Deceased) Heart disease     Father (Deceased) Heart disease     Brother Heart Problems heart transplant    Paternal Uncle (Deceased) Prostate cancer     Maternal Grandmother (Deceased) Heart disease     Maternal Grandfather (Deceased) Heart disease     Paternal Grandmother (Deceased) Heart disease     Paternal Grandfather (Deceased) Heart disease     Son (Deceased) Cancer  thymic cancer.  died at age 70     Social History:  Alcohol: none  Smoking: quit smoking when he was 31   Labs:  Lipid Panel     Component Value Date/Time   CHOL 149 11/09/2023 1111   TRIG 50 11/09/2023 1111   HDL 62 11/09/2023 1111   CHOLHDL 2.4 11/09/2023 1111   VLDL 10 11/09/2023 1111   LDLCALC 77 11/09/2023 1111    Past Medical History:  Diagnosis Date   Abnormal nuclear cardiac imaging test March 2011   Has positive EKG response, no perfusion defect and normal EF   Adenomatous colon polyp 12/2002   Arrhythmia    Atrial fibrillation (HCC)    a. s/p rfca;  b. chronic tikosyn and xarelto.   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 04/2017 Echo: EF 65-70%, restrictive physiology, mildly dil LA.   COPD (chronic obstructive pulmonary disease) (HCC)    Coronary artery disease    First MI in 1988. PCI in 1996 with subsequent CABG in 1996 due to restenosis. S/P stents to LCX in 2009. Noted to  have residual disease in the LAD in a diffuse manner and atretic LIMA graft. He is managed medically.    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   History of Raynaud's syndrome    Hyperlipidemia    Hypertension    Internal hemorrhoids    Muscle spasm    Myocardial infarction (HCC)    MI x1 1989 - 64 age   Neuromuscular disorder (HCC)    Polio - age 27   OSA (obstructive sleep apnea) 10/16/2018   Severe OSA with AHI 37.2/hr on BiPAP   Persistent atrial fibrillation (HCC)    Polio    age 76   PVC (premature ventricular contraction)    Scoliosis    mild    Current Outpatient Medications on File Prior to Visit  Medication Sig Dispense  Refill   acetaminophen (TYLENOL) 500 MG tablet Take 250-500 mg by mouth every 8 (eight) hours as needed for mild pain or moderate pain.      albuterol (PROAIR HFA) 108 (90 BASE) MCG/ACT inhaler Inhale 2 puffs into the lungs every 4 (four) hours as needed for wheezing or shortness of breath (and prior to exercise). 1 Inhaler 3   amiodarone (PACERONE) 200 MG tablet Take 1 tablet (200 mg total) by mouth 2 (two) times daily. 60 tablet 0   amiodarone (PACERONE) 200 MG tablet Take 1 tablet (200 mg total) by mouth daily. 90 tablet 1   Cholecalciferol (VITAMIN D3) 50 MCG (2000 UT) CAPS Take 2,000 Units by mouth every evening.     dicyclomine (BENTYL) 10 MG capsule TAKE ONE CAPSULE BY MOUTH 3 TIMES DAILY AS NEEDED FOR SPASMS 270 capsule 3   diltiazem (CARDIZEM CD) 240 MG 24 hr capsule TAKE 1 CAPSULE BY MOUTH EVERYDAY AT BEDTIME 90 capsule 3   ELIQUIS 2.5 MG TABS tablet TAKE 1 TABLET BY MOUTH TWICE A DAY 180 tablet 3   ezetimibe (ZETIA) 10 MG tablet TAKE 1 TABLET BY MOUTH EVERY DAY 90 tablet 3   famotidine (PEPCID) 40 MG tablet TAKE 1 TABLET BY MOUTH EVERYDAY AT BEDTIME 90 tablet 0   furosemide (LASIX) 20 MG tablet Take 1 tablet (20 mg total) by mouth 2 (two) times a week. 30 tablet 4   JARDIANCE 10 MG TABS tablet TAKE 1 TABLET BY MOUTH EVERY DAY BEFORE BREAKFAST 90 tablet 3   lactobacillus acidophilus & bulgar (LACTINEX) chewable tablet Chew 2 tablets by mouth at bedtime. (Patient not taking: Reported on 04/09/2024)     loperamide (IMODIUM) 2 MG capsule Take 2 mg by mouth as needed for diarrhea or loose stools.     LORazepam (ATIVAN) 0.5 MG tablet Take 0.5 mg by mouth at bedtime.     methocarbamol (ROBAXIN) 500 MG tablet Take 500 mg by mouth 3 (three) times daily as needed for muscle spasms.      metoprolol succinate (TOPROL XL) 25 MG 24 hr tablet Take 1 tablet (25 mg total) by mouth daily. 90 tablet 3   nitroGLYCERIN (NITROSTAT) 0.4 MG SL tablet Place 1 tablet (0.4 mg total) under the tongue every 5 (five)  minutes as needed for chest pain. 25 tablet 0   Probiotic Product (ALIGN) 4 MG CAPS Take 4 mg by mouth daily. (0800)  rosuvastatin (CRESTOR) 40 MG tablet TAKE 1 TABLET BY MOUTH EVERY DAY 90 tablet 3   tadalafil (CIALIS) 10 MG tablet Take 10 mg by mouth daily in the afternoon. Troche* for circulation     Testosterone POWD Apply 100 mg topically daily. Testosterone (AT) 150/0(150mg ) cpd     traZODone (DESYREL) 50 MG tablet Take 50 mg by mouth at bedtime.     No current facility-administered medications on file prior to visit.    Allergies  Allergen Reactions   Morphine And Codeine Other (See Comments)    IV forms- vein irritation   Tape Other (See Comments)    Skin Irritation (Plastic)    Assessment/Plan:  1. Hyperlipidemia -  Problem  Hyperlipidemia   Current Medications: Crestor 40 mg daily and Zetia 10 mg daily  Intolerances: none  Risk Factors: first MI at age of 69, CVA: 10/20,CAD: H/o CABG. He had NSTEMI in 3/20 with DES to ostial/proximal LAD and SVG-RCA, family hx of premature ASCVD - MGM- died from MI at age 89 father - died from MI age 70 and mother - died from MI at age 48  LDL goal: <55 mg/dl  Last lab at PCP : 16/1096 TC 174, TG 75, HDL 64, LDL 95    Hyperlipidemia Assessment:  LDL goal: < 70 mg/dl or <04 given hx of premature ASCVD, last LDLc 95 mg/dl (54/0981) Tolerates Zetia and high intensity statins well without any side effects  Follows heart healthy diet stays active at work and home  Discussed next potential options (PCSK-9 inhibitors, bempedoic acid and inclisiran); cost, dosing efficacy, side effects  Given hx of persona and family premature ASCVD will get Lpa checked today   Plan: Continue taking current medications (Crestor 40 mg daily and Zetia 10 mg daily ) Will apply for PA for PCSK9i; will inform patient upon approval  Lipid lab due in 2-3 months after starting PCSK9i    Thank you,  Nickola Baron, Pharm.D  [facility] [address] [place], [place] [address]  Phone: [phone]; Fax: [phone]

## 2024-04-13 ENCOUNTER — Other Ambulatory Visit (HOSPITAL_COMMUNITY): Payer: Self-pay

## 2024-04-13 NOTE — Telephone Encounter (Signed)
 Pharmacy Patient Advocate Encounter  Received notification from [employer]RX [employer] that Prior Authorization for repatha  has been APPROVED from 04/13/24 to 10/13/24. Ran test claim, Copay is $47.00. This test claim was processed through [facility]- copay amounts may vary at other pharmacies due to pharmacy/plan contracts, or as the patient moves through the different stages of their insurance plan.   PA #/Case ID/Reference #: [id number]

## 2024-04-16 MED ORDER — REPATHA SURECLICK 140 MG/ML ~~LOC~~ SOAJ: 140.0000 mg | SUBCUTANEOUS | 3 refills | Status: DC

## 2024-04-16 NOTE — Telephone Encounter (Signed)
 PA approved see other encounter for more info

## 2024-04-16 NOTE — Addendum Note (Signed)
 Addended by: Jarmarcus Wambold K on: 04/16/2024 04:55 PM   Modules accepted: Orders

## 2024-04-20 DIAGNOSIS — H04123 Dry eye syndrome of bilateral lacrimal glands: Secondary | ICD-10-CM | POA: Diagnosis not present

## 2024-04-20 DIAGNOSIS — H5700 Unspecified anomaly of pupillary function: Secondary | ICD-10-CM | POA: Diagnosis not present

## 2024-04-20 DIAGNOSIS — H53143 Visual discomfort, bilateral: Secondary | ICD-10-CM | POA: Diagnosis not present

## 2024-05-04 ENCOUNTER — Encounter: Payer: Self-pay | Admitting: Gastroenterology

## 2024-05-04 ENCOUNTER — Ambulatory Visit: Admitting: Gastroenterology

## 2024-05-04 VITALS — BP 126/68 | HR 70 | Ht 66.0 in | Wt 122.2 lb

## 2024-05-04 DIAGNOSIS — K219 Gastro-esophageal reflux disease without esophagitis: Secondary | ICD-10-CM | POA: Diagnosis not present

## 2024-05-04 DIAGNOSIS — K862 Cyst of pancreas: Secondary | ICD-10-CM

## 2024-05-04 DIAGNOSIS — K580 Irritable bowel syndrome with diarrhea: Secondary | ICD-10-CM | POA: Diagnosis not present

## 2024-05-04 MED ORDER — FAMOTIDINE 40 MG PO TABS: ORAL_TABLET | ORAL | 0 refills | Status: DC

## 2024-05-04 MED ORDER — DICYCLOMINE HCL 10 MG PO CAPS: ORAL_CAPSULE | ORAL | 3 refills | Status: AC

## 2024-05-04 NOTE — Progress Notes (Signed)
 Chief Complaint:annual follow-up, medication refills Primary GI Doctor: (previously Dr. Sandrea Cruel) Dr. Venice Gillis  HPI: 88 year old male returning for follow-up of GERD, IBS-D, and pancreatic cyst. Previously known to Dr. Sandrea Cruel. Last seen in the office on 12/07/22 for routine follow-up. His IBS-D was managed with Imodium and dicyclomine  prn. His reflux was well controlled on famotidine  40 mg po daily. Follow-up MRI/MRCP for pancreatic cyst. 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 As there is no observed increased risk of malignancy for such lesions smaller than 2 cm, particularly given well established stability of this lesion and advanced patient age no further follow-up or characterization is required.  Interval History    Patient hast history of GERD and currently taking famotidine  40 mg po daily which controls his symptoms. Patient denies dysphagia.  For his IBS-D he uses OTC imodium and dicyclomine  prn which works well. Patient denies altered bowel habits, abdominal pain,or rectal bleeding  To add to his medical history patient tells me he was started on amiodarone  twice daily about a month ago for atrial fibrillation.   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  Wt Readings from Last 3 Encounters:  05/04/24 122 lb 3.2 oz (55.4 kg)  04/09/24 119 lb 6.4 oz (54.2 kg)  03/12/24 119 lb (54 kg)    Past Medical History:  Diagnosis Date   Abnormal nuclear cardiac imaging test March 2011   Has positive EKG response, no perfusion defect and normal EF   Adenomatous colon polyp 12/2002   Arrhythmia    Atrial fibrillation (HCC)    a. s/p rfca;  b. chronic tikosyn  and xarelto .   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 04/2017 Echo: EF 65-70%, restrictive physiology, mildly dil LA.   COPD (chronic obstructive pulmonary disease) (HCC)    Coronary artery disease    First MI in 1988. PCI in 1996 with subsequent CABG in 1996 due to restenosis. S/P stents to LCX in 2009. Noted to have residual disease in the LAD in a diffuse manner and atretic LIMA graft. He is managed medically.    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   History of Raynaud's syndrome    Hyperlipidemia    Hypertension    Internal hemorrhoids    Muscle spasm    Myocardial infarction (HCC)    MI x1 1989 - 17 age   Neuromuscular disorder (HCC)    Polio - age 52   OSA (obstructive sleep apnea) 10/16/2018   Severe OSA with AHI 37.2/hr on BiPAP   Persistent atrial fibrillation (HCC)    Polio    age 66   PVC (premature ventricular contraction)    Scoliosis    mild    Past Surgical History:  Procedure Laterality Date   ABLATION     done for a fib   APPENDECTOMY     Age 72   ATRIAL FIBRILLATION ABLATION N/A 06/08/2019  Procedure: ATRIAL FIBRILLATION ABLATION;  Surgeon: Lei Pump, MD;  Location: [facility] INVASIVE CV LAB;  Service: Cardiovascular;  Laterality: N/A;   ATRIAL FIBRILLATION ABLATION N/A 11/19/2020   Procedure: ATRIAL FIBRILLATION ABLATION;  Surgeon: Lei Pump, MD;  Location: [facility] INVASIVE CV LAB;  Service: Cardiovascular;  Laterality: N/A;   CARDIAC CATHETERIZATION     CARDIOVERSION N/A 08/22/2013   Procedure: CARDIOVERSION;  Surgeon: Deena Farrier, MD;  Location: [facility] ENDOSCOPY;  Service: Cardiovascular;  Laterality: N/A;   CARDIOVERSION N/A 01/16/2014   Procedure: CARDIOVERSION;  Surgeon: Lenise Quince, MD;  Location: [facility] ENDOSCOPY;  Service: Cardiovascular;  Laterality:  N/A;   CARDIOVERSION N/A 12/11/2018   Procedure: CARDIOVERSION;  Surgeon: Darlis Eisenmenger, MD;  Location: [facility] ENDOSCOPY;  Service: Cardiovascular;  Laterality: N/A;   CARDIOVERSION N/A 11/20/2019   Procedure: CARDIOVERSION;  Surgeon: Wendie Hamburg, MD;  Location: [facility] ENDOSCOPY;  Service: Endoscopy;  Laterality: N/A;   CARDIOVERSION N/A 07/10/2020   Procedure: CARDIOVERSION;  Surgeon: Darlis Eisenmenger, MD;  Location[facility] ENDOSCOPY;  Service: Cardiovascular;  Laterality: N/A;   CARDIOVERSION N/A 01/21/2021   Procedure: CARDIOVERSION;  Surgeon: Darlis Eisenmenger, MD;  Location: [facility] ENDOSCOPY;  Service: Cardiovascular;  Laterality: N/A;   CARDIOVERSION N/A 09/30/2023   Procedure: CARDIOVERSION;  Surgeon: Olinda Bertrand, DO;  Location: [facility] INVASIVE CV LAB;  Service: Cardiovascular;  Laterality: N/A;   COLONOSCOPY  2008   CORONARY ARTERY BYPASS GRAFT  1996   with a LIMA to the LAD, SVG to RCA and SVG to OM   CORONARY STENT INTERVENTION N/A 03/26/2019   Procedure: CORONARY STENT INTERVENTION;  Surgeon: Lucendia Rusk, MD;  Location: [facility] INVASIVE CV LAB;  Service: Cardiovascular;  Laterality: N/A;   CORONARY STENT PLACEMENT  1996   LCX   EMBOLECTOMY Right 05/09/2019   Procedure: Embolectomy of right radial artery;  Surgeon: Margherita Shell, MD;  Location: [facility] OR;  Service: Vascular;  Laterality: Right;   ESOPHAGEAL DILATION     EYE SURGERY     FALSE ANEURYSM REPAIR Right 05/09/2019   Procedure: REPAIR FALSE ANEURYSM RIGHT RADIAL;  Surgeon: Margherita Shell, MD;  Location: [facility] OR;  Service: Vascular;  Laterality: Right;   LEFT HEART CATH AND CORS/GRAFTS ANGIOGRAPHY N/A 03/26/2019   Procedure: LEFT HEART CATH AND CORS/GRAFTS ANGIOGRAPHY;  Surgeon: Darlis Eisenmenger, MD;  Location: [facility] INVASIVE CV LAB;  Service: Cardiovascular;  Laterality: N/A;   POLYPECTOMY     TONSILLECTOMY     UPPER GASTROINTESTINAL ENDOSCOPY     esophegeal dilation    Current Outpatient Medications  Medication Sig Dispense  Refill   acetaminophen  (TYLENOL ) 500 MG tablet Take 250-500 mg by mouth every 8 (eight) hours as needed for mild pain or moderate pain.      albuterol  (PROAIR  HFA) 108 (90 BASE) MCG/ACT inhaler Inhale 2 puffs into the lungs every 4 (four) hours as needed for wheezing or shortness of breath (and prior to exercise). 1 Inhaler 3   amiodarone  (PACERONE ) 200 MG tablet Take 1 tablet (200 mg total) by mouth 2 (two) times daily. 60 tablet 0   amiodarone  (PACERONE ) 200 MG tablet Take 1 tablet (200 mg total) by mouth daily. 90 tablet 1   Cholecalciferol (VITAMIN D3) 50 MCG (2000 UT) CAPS Take 2,000 Units by mouth every evening.     diltiazem  (CARDIZEM  CD) 240 MG 24 hr capsule TAKE 1 CAPSULE BY MOUTH EVERYDAY AT BEDTIME 90 capsule 3   ELIQUIS  2.5 MG TABS tablet TAKE 1 TABLET  BY MOUTH TWICE A DAY 180 tablet 3   Evolocumab  (REPATHA  SURECLICK) 140 MG/ML SOAJ Inject 140 mg into the skin every 14 (fourteen) days. 6 mL 3   ezetimibe  (ZETIA ) 10 MG tablet TAKE 1 TABLET BY MOUTH EVERY DAY 90 tablet 3   furosemide  (LASIX ) 20 MG tablet Take 1 tablet (20 mg total) by mouth 2 (two) times a week. 30 tablet 4   JARDIANCE  10 MG TABS tablet TAKE 1 TABLET BY MOUTH EVERY DAY BEFORE BREAKFAST 90 tablet 3   loperamide (IMODIUM) 2 MG capsule Take 2 mg by mouth as needed for diarrhea or loose stools.     LORazepam  (ATIVAN ) 0.5 MG tablet Take 0.5 mg by mouth at bedtime.     methocarbamol  (ROBAXIN ) 500 MG tablet Take 500 mg by mouth 3 (three) times daily as needed for muscle spasms.      metoprolol  succinate (TOPROL  XL) 25 MG 24 hr tablet Take 1 tablet (25 mg total) by mouth daily. 90 tablet 3   nitroGLYCERIN  (NITROSTAT ) 0.4 MG SL tablet Place 1 tablet (0.4 mg total) under the tongue every 5 (five) minutes as needed for chest pain. 25 tablet 0   Probiotic Product (ALIGN) 4 MG CAPS Take 4 mg by mouth daily. (0800)     rosuvastatin  (CRESTOR ) 40 MG tablet TAKE 1 TABLET BY MOUTH EVERY DAY 90 tablet 3   tadalafil (CIALIS) 10 MG tablet  Take 10 mg by mouth daily in the afternoon. Troche* for circulation     Testosterone  POWD Apply 100 mg topically daily. Testosterone  (AT) 150/0(150mg ) cpd     traZODone  (DESYREL ) 50 MG tablet Take 50 mg by mouth at bedtime.     dicyclomine  (BENTYL ) 10 MG capsule TAKE ONE CAPSULE BY MOUTH 3 TIMES DAILY AS NEEDED FOR SPASMS 270 capsule 3   famotidine  (PEPCID ) 40 MG tablet TAKE 1 TABLET BY MOUTH EVERYDAY AT BEDTIME 90 tablet 0   lactobacillus acidophilus & bulgar (LACTINEX) chewable tablet Chew 2 tablets by mouth at bedtime. (Patient not taking: Reported on 04/09/2024)     No current facility-administered medications for this visit.    Allergies as of 05/04/2024 - Review Complete 05/04/2024  Allergen Reaction Noted   Morphine  and codeine Other (See Comments) 08/19/2011   Tape Other (See Comments) 03/25/2019    Family History  Problem Relation Age of Onset   Heart disease Mother    Heart disease Father    Heart Problems Brother        heart transplant   Heart disease Maternal Grandmother    Heart disease Maternal Grandfather    Heart disease Paternal Grandmother    Heart disease Paternal Grandfather    Prostate cancer Paternal Uncle    Cancer Son        thymic cancer.  died at age 97   Colon cancer Neg Hx    Esophageal cancer Neg Hx    Pancreatic cancer Neg Hx    Rectal cancer Neg Hx    Stomach cancer Neg Hx     Review of Systems:    Constitutional: No weight loss, fever, chills, weakness or fatigue HEENT: Eyes: No change in vision               Ears, Nose, Throat:  No change in hearing or congestion Skin: No rash or itching Cardiovascular: No chest pain, chest pressure or palpitations   Respiratory: No SOB or cough Gastrointestinal: See HPI and otherwise negative Genitourinary: No dysuria or change in urinary frequency Neurological:  No headache, dizziness or syncope Musculoskeletal: No new muscle or joint pain Hematologic: No bleeding or bruising Psychiatric: No history of  depression or anxiety    Physical Exam:  Vital signs: BP 126/68   Pulse 70   Ht 5\' 6"  (1.676 m)   Wt 122 lb 3.2 oz (55.4 kg)   SpO2 97%   BMI 19.72 kg/m   Constitutional: Pleasant male appears to be in NAD, Well developed, Well nourished, alert and cooperative Throat: Oral cavity and pharynx without inflammation, swelling or lesion.  Respiratory: Respirations even and unlabored. Lungs clear to auscultation bilaterally.   No wheezes, crackles, or rhonchi.  Cardiovascular: Normal S1, S2. Regular rate and rhythm. No peripheral edema, cyanosis or pallor.  Gastrointestinal:  Soft, nondistended, nontender. No rebound or guarding. Normal bowel sounds. No appreciable masses or hepatomegaly. Rectal:  Not performed.  Msk:  Symmetrical without gross deformities. Without edema, no deformity or joint abnormality.  Neurologic:  Alert and  oriented x4;  grossly normal neurologically.  Skin:   Dry and intact without significant lesions or rashes. Psychiatric: Oriented to person, place and time. Demonstrates good judgement and reason without abnormal affect or behaviors.  RELEVANT LABS AND IMAGING: CBC    Latest Ref Rng & Units 09/30/2023   10:11 AM 06/23/2023    3:23 PM 12/09/2022    9:36 AM  CBC  WBC 4.0 - 10.5 K/uL  6.8  6.4   Hemoglobin 13.0 - 17.0 g/dL 11.9  14.7  82.9   Hematocrit 39.0 - 52.0 % 50.0  48.1  45.8   Platelets 150 - 400 K/uL  154  192      CMP     Latest Ref Rng & Units 03/26/2024    9:51 AM 03/02/2024    9:28 AM 02/23/2024   10:48 AM  CMP  Glucose 70 - 99 mg/dL 96  562  130   BUN 8 - 23 mg/dL 18  21  19    Creatinine 0.61 - 1.24 mg/dL 8.65  7.84  6.96   Sodium 135 - 145 mmol/L 139  139  138   Potassium 3.5 - 5.1 mmol/L 4.1  4.5  3.9   Chloride 98 - 111 mmol/L 104  106  101   CO2 22 - 32 mmol/L 26  26  28    Calcium  8.9 - 10.3 mg/dL 29.5  9.9  28.4     Lab Results  Component Value Date   TSH 1.123 10/21/2019     Assessment: Encounter Diagnoses  Name Primary?    Gastroesophageal reflux disease, unspecified whether esophagitis present Yes   Cyst of pancreas    Irritable bowel syndrome with diarrhea      88 year old male patient that presents for follow-up and medication refills .Patient's GERD with history of an esophageal stricture controlled with famotidine  40 mg p.o. daily, refill sent.  Patient's IBS-D managed with dicyclomine  and Imodium as needed, refilled dicyclomine . 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  Up-to-date on colonoscopy no repeat due to age.  Patient inquires about having further refills abided by his PCP and following up with their office only as needed.  Plan: -Continue famotidine  40 mg po qd and follow antireflux measures -Continue dicyclomine  10 mg p.o. 3 times daily as needed and  -Continue Imodium 1-2 p.o. twice daily as needed - Follow-up as needed, patient will request future refills form PCP  Thank you for the courtesy of this  consult. Please call me with any questions or concerns.   Millie Shorb, FNP-C [facility] 05/04/2024, 10:41 AM  Cc: [name], MD

## 2024-05-04 NOTE — Patient Instructions (Addendum)
 Continue famotidine  40 mg po qd and follow antireflux measures- refilled Continue dicyclomine  10 mg p.o. 3 times daily as needed -refilled Continue Imodium 1-2 p.o. twice daily as needed  Call office for any issues or questions  _______________________________________________________  If your blood pressure at your visit was 140/90 or greater, please contact your primary care physician to follow up on this.  _______________________________________________________  If you are age 78 or older, your body mass index should be between 23-30. Your Body mass index is 19.72 kg/m. If this is out of the aforementioned range listed, please consider follow up with your Primary Care Provider.  If you are age 37 or younger, your body mass index should be between 19-25. Your Body mass index is 19.72 kg/m. If this is out of the aformentioned range listed, please consider follow up with your Primary Care Provider.   ________________________________________________________  The [facility] providers would like to encourage you to use MYCHART to communicate with providers for non-urgent requests or questions.  Due to long hold times on the telephone, sending your provider a message by [facility] may be a faster and more efficient way to get a response.  Please allow 48 business hours for a response.  Please remember that this is for non-urgent requests.  _______________________________________________________  Thank you for trusting me with your gastrointestinal care!   Dyanna Glasgow, RNP

## 2024-05-09 ENCOUNTER — Other Ambulatory Visit: Payer: Self-pay | Admitting: Cardiology

## 2024-05-09 ENCOUNTER — Ambulatory Visit (HOSPITAL_COMMUNITY)
Admission: RE | Admit: 2024-05-09 | Discharge: 2024-05-09 | Disposition: A | Discharge status: Home / Self Care | Source: Ambulatory Visit | Attending: Internal Medicine | Admitting: Internal Medicine

## 2024-05-09 ENCOUNTER — Other Ambulatory Visit: Payer: Self-pay

## 2024-05-09 VITALS — BP 136/60 | HR 43 | Ht 66.0 in | Wt 120.2 lb

## 2024-05-09 DIAGNOSIS — D6869 Other thrombophilia: Secondary | ICD-10-CM | POA: Diagnosis not present

## 2024-05-09 DIAGNOSIS — I4891 Unspecified atrial fibrillation: Secondary | ICD-10-CM

## 2024-05-09 DIAGNOSIS — K219 Gastro-esophageal reflux disease without esophagitis: Secondary | ICD-10-CM

## 2024-05-09 DIAGNOSIS — I4819 Other persistent atrial fibrillation: Secondary | ICD-10-CM | POA: Diagnosis not present

## 2024-05-09 DIAGNOSIS — Z5181 Encounter for therapeutic drug level monitoring: Secondary | ICD-10-CM | POA: Diagnosis not present

## 2024-05-09 DIAGNOSIS — Z79899 Other long term (current) drug therapy: Secondary | ICD-10-CM

## 2024-05-09 MED ORDER — FAMOTIDINE 40 MG PO TABS
ORAL_TABLET | ORAL | 3 refills | Status: AC
Start: 2024-05-09 — End: ?

## 2024-05-09 MED ORDER — AMIODARONE HCL 200 MG PO TABS: 200.0000 mg | ORAL_TABLET | Freq: Every day | ORAL | 1 refills | Status: DC

## 2024-05-09 MED ORDER — DILTIAZEM HCL ER COATED BEADS 120 MG PO CP24: 120.0000 mg | ORAL_CAPSULE | Freq: Every day | ORAL | 3 refills | Status: DC

## 2024-05-09 NOTE — Patient Instructions (Signed)
 STOP Metoprolol   DECREASE Cardizem  120mg  once a day   DECREASE Amiodarone  200mg  once a day

## 2024-05-09 NOTE — Progress Notes (Signed)
 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 The patient was initially diagnosed with atrial fibrillation several years ago and is s/p afib ablation 06/25/14 at [facility] by Dr. [name] and has been maintained on Tikosyn  since. He has had some possible a fib, atrial flutter vs ectopic atrial rhythm. On 12/07/18, he was noted to be in an ectopic atrial rhythm with 2:1 block and was cardioverted on 12/11/18. On review of his ECGs, it appears he had some transient AV dissociation post cardioversion but is back in SR on follow up. He has been asymptomatic. He denies bleeding issues with anticoagulation. He has OSA and struggles with using his CPAP. He is now on an oral device and follows with Dr [name] and Dr [name].  Patient is s/p repeat afib ablation 06/08/19 with Dr [name]. Patient admitted 10/24-10/26/20 with acute CVA after holding anticoagulation for oral surgery. He is on Eliquis  for a CHADS2VASC score of 7. Patient is s/p DCCV 11/20/19. Patient was back in atrial flutter at his visit with Dr [name] on 06/10/20. His BB was stopped at that point 2/2 side effects of fatigue. Patient reports he feels like he has more energy off the BB however, his heart rate had been elevated around 120-130 at home. Patient is s/p DCCV on 07/10/20.   On follow up today, patient is s/p repeat ablation 11/19/20 with Dr [name]. He reports that he has noted an irregular pulse on his home BP machine starting 12/5. He does have some fatigue but otherwise feels well. ECG today shows sinus rhythm with frequent junctional beats. He denies CP, swallowing, or groin issues.  On follow up 10/19/23, he is currently in atrial flutter.  S/p successful DCCV on 09/30/23. He is taking Tikosyn  250 mcg BID. He states he is not surprised he is back out of rhythm.   On follow up 05/09/24, he is currently in junctional bradycardia vs atrial flutter. He was last seen by Dr. [name] on 4/14 and after discussion transitioned from Tikosyn  to amiodarone . He is taking amiodarone  200 mg twice daily. He fell twice yesterday due to being dizzy; he fell on carpet and did not hit his head. No missed doses of Eliquis  2.5 mg BID.   Today, he denies symptoms of palpitations, SOB, chest pain, orthopnea, PND, lower extremity edema, presyncope, syncope, bleeding, or neurologic sequela. The patient is tolerating medications without difficulties and is otherwise without complaint today.    Atrial Fibrillation Risk Factors:  he does have symptoms or diagnosis of sleep apnea. he is compliant with OSA therapy. Now on oral appliance.    he has a BMI of Body mass index is 19.4 kg/m.Aaron Aas Filed Weights   05/09/24 0854  Weight: 54.5 kg     Atrial Fibrillation Management history:  Previous antiarrhythmic drugs: Tikosyn , amiodarone  Previous cardioversions: 12/11/18, 11/20/19, 07/10/20, 09/30/23 Previous ablations: 2015 at [facility], 06/08/19, 11/19/20 CHADS2VASC score: 6 (age, HTN, CAD, CVA) Anticoagulation history: Xarelto , Eliquis    Past Medical History:  Diagnosis Date   Abnormal nuclear cardiac imaging test March 2011   Has positive EKG response, no perfusion defect and normal EF   Adenomatous colon polyp 12/2002   Arrhythmia    Atrial fibrillation (HCC)    a.  s/p rfca;  b. chronic tikosyn  and xarelto .   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 04/2017 Echo: EF 65-70%, restrictive physiology, mildly dil LA.   COPD (chronic obstructive pulmonary disease) (HCC)    Coronary artery disease    First MI in  1988. PCI in 1996 with subsequent CABG in 1996 due to restenosis. S/P stents to LCX in 2009. Noted to have residual disease in the LAD in a diffuse manner and atretic LIMA graft. He is managed medically.    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   History of Raynaud's syndrome    Hyperlipidemia    Hypertension    Internal hemorrhoids    Muscle spasm    Myocardial infarction (HCC)    MI x1 1989 - 28 age   Neuromuscular disorder (HCC)    Polio - age 27   OSA (obstructive sleep apnea) 10/16/2018   Severe OSA with AHI 37.2/hr on BiPAP   Persistent atrial fibrillation (HCC)    Polio    age 43   PVC (premature ventricular contraction)    Scoliosis    mild   Past Surgical History:  Procedure Laterality Date   ABLATION     done for a fib   APPENDECTOMY     Age 29   ATRIAL FIBRILLATION ABLATION N/A 06/08/2019   Procedure: ATRIAL FIBRILLATION ABLATION;  Surgeon: Lei Pump, MD;  Location: [facility] INVASIVE CV LAB;  Service: Cardiovascular;  Laterality: N/A;   ATRIAL FIBRILLATION ABLATION N/A 11/19/2020   Procedure: ATRIAL FIBRILLATION ABLATION;  Surgeon: Lei Pump, MD;  Location: [facility] CV LAB;  Service: Cardiovascular;  Laterality: N/A;   CARDIAC CATHETERIZATION     CARDIOVERSION N/A 08/22/2013   Procedure: CARDIOVERSION;  Surgeon: Deena Farrier, MD;  Location: [facility] ENDOSCOPY;  Service: Cardiovascular;  Laterality: N/A;   CARDIOVERSION N/A 01/16/2014   Procedure: CARDIOVERSION;  Surgeon: Lenise Quince, MD;  Location: [facility] ENDOSCOPY;  Service: Cardiovascular;  Laterality: N/A;   CARDIOVERSION N/A 12/11/2018   Procedure: CARDIOVERSION;  Surgeon: Darlis Eisenmenger, MD;  Location: [facility] ENDOSCOPY;  Service: Cardiovascular;  Laterality: N/A;   CARDIOVERSION N/A 11/20/2019    Procedure: CARDIOVERSION;  Surgeon: Wendie Hamburg, MD;  Location: [facility] ENDOSCOPY;  Service: Endoscopy;  Laterality: N/A;   CARDIOVERSION N/A 07/10/2020   Procedure: CARDIOVERSION;  Surgeon: Darlis Eisenmenger, MD;  Location: [facility] ENDOSCOPY;  Service: Cardiovascular;  Laterality: N/A;   CARDIOVERSION N/A 01/21/2021   Procedure: CARDIOVERSION;  Surgeon: Darlis Eisenmenger, MD;  Location: [facility] ENDOSCOPY;  Service: Cardiovascular;  Laterality: N/A;   CARDIOVERSION N/A 09/30/2023   Procedure: CARDIOVERSION;  Surgeon: Olinda Bertrand, DO;  Location: [facility] INVASIVE CV LAB;  Service: Cardiovascular;  Laterality: N/A;   COLONOSCOPY  2008   CORONARY ARTERY BYPASS GRAFT  1996   with a LIMA to the LAD, SVG to RCA and SVG to OM   CORONARY STENT INTERVENTION N/A 03/26/2019   Procedure: CORONARY STENT INTERVENTION;  Surgeon: Lucendia Rusk, MD;  Location: [facility] INVASIVE CV LAB;  Service: Cardiovascular;  Laterality: N/A;   CORONARY STENT PLACEMENT  1996   LCX   EMBOLECTOMY Right 05/09/2019   Procedure: Embolectomy of right radial artery;  Surgeon: Margherita Shell, MD;  Location: [facility] OR;  Service: Vascular;  Laterality: Right;   ESOPHAGEAL DILATION     EYE SURGERY     FALSE ANEURYSM REPAIR Right 05/09/2019   Procedure: REPAIR FALSE ANEURYSM RIGHT RADIAL;  Surgeon: Margherita Shell, MD;  Location: [facility] OR;  Service: Vascular;  Laterality: Right;   LEFT HEART CATH AND CORS/GRAFTS ANGIOGRAPHY N/A 03/26/2019   Procedure: LEFT HEART CATH AND CORS/GRAFTS ANGIOGRAPHY;  Surgeon: Darlis Eisenmenger, MD;  Location: [facility] INVASIVE CV LAB;  Service: Cardiovascular;  Laterality: N/A;   POLYPECTOMY     TONSILLECTOMY     UPPER GASTROINTESTINAL ENDOSCOPY     esophegeal dilation    Current Outpatient Medications  Medication Sig Dispense Refill   acetaminophen  (TYLENOL ) 500 MG tablet Take 250-500 mg by mouth as needed for mild pain (pain score 1-3) or moderate pain (pain score 4-6).     albuterol  (PROAIR  HFA) 108 (90 BASE) MCG/ACT  inhaler Inhale 2 puffs into the lungs every 4 (four) hours as needed for wheezing or shortness of breath (and prior to exercise). 1 Inhaler 3   Cholecalciferol (VITAMIN D3) 50 MCG (2000 UT) CAPS Take 2,000 Units by mouth every evening.     dicyclomine  (BENTYL ) 10 MG capsule TAKE ONE CAPSULE BY MOUTH 3 TIMES DAILY AS NEEDED FOR SPASMS 270 capsule 3   ELIQUIS  2.5 MG TABS tablet TAKE 1 TABLET BY MOUTH TWICE A DAY 180 tablet 3   ezetimibe  (ZETIA ) 10 MG tablet TAKE 1 TABLET BY MOUTH EVERY DAY 90 tablet 3   famotidine  (PEPCID ) 40 MG tablet TAKE 1 TABLET BY MOUTH EVERYDAY AT BEDTIME 90 tablet 0   furosemide  (LASIX ) 20 MG tablet Take 1 tablet (20 mg total) by mouth 2 (two) times a week. 30 tablet 4   JARDIANCE  10 MG TABS tablet TAKE 1 TABLET BY MOUTH EVERY DAY BEFORE BREAKFAST 90 tablet 3   loperamide (IMODIUM) 2 MG capsule Take 2 mg by mouth as needed for diarrhea or loose stools.     LORazepam  (ATIVAN ) 0.5 MG tablet Take 0.5 mg by mouth at bedtime.     methocarbamol  (ROBAXIN ) 500 MG tablet Take 500 mg by mouth 3 (three) times daily as needed for muscle spasms.      nitroGLYCERIN  (NITROSTAT ) 0.4 MG SL tablet Place 1 tablet (0.4 mg total) under the tongue every 5 (five) minutes as needed for chest pain. 25 tablet 0   Probiotic Product (ALIGN) 4 MG CAPS Take 4 mg by mouth daily. (0800)     rosuvastatin  (CRESTOR ) 40 MG tablet TAKE 1 TABLET BY MOUTH EVERY DAY 90 tablet 3   tadalafil (CIALIS) 10 MG tablet Take 10 mg by mouth daily in the afternoon. Troche* for circulation     Testosterone  POWD Apply 100 mg topically daily. Testosterone  (AT) 150/0(150mg ) cpd     traZODone  (DESYREL ) 50 MG tablet Take 50 mg by mouth at bedtime.     amiodarone  (PACERONE ) 200 MG tablet Take 1 tablet (200 mg total) by mouth daily. 90 tablet 1   diltiazem  (CARDIZEM  CD) 120 MG 24 hr capsule Take 1 capsule (120 mg total) by mouth daily. 30 capsule 3   Evolocumab  (REPATHA  SURECLICK) 140 MG/ML SOAJ Inject 140 mg into the skin every 14  (fourteen) days. (Patient not taking: Reported on 05/09/2024) 6 mL 3   No current facility-administered medications for this encounter.  Allergies  Allergen Reactions   Morphine  And Codeine Other (See Comments)    IV forms- vein irritation   Tape Other (See Comments)    Skin Irritation (Plastic)   ROS- All systems are reviewed and negative except as per the HPI above.  Physical Exam: Vitals:   05/09/24 0854  BP: 136/60  Pulse: (!) 43  Weight: 54.5 kg  Height: 5\' 6"  (1.676 m)    GEN- The patient is well appearing, alert and oriented x 3 today.   Neck - no JVD or carotid bruit noted Lungs- Clear to ausculation bilaterally, normal work of breathing Heart- Irregular bradycardic rate and rhythm, no murmurs, rubs or gallops, PMI not laterally displaced Extremities- no clubbing, cyanosis, or edema Skin - no rash or ecchymosis noted   Wt Readings from Last 3 Encounters:  05/09/24 54.5 kg  05/04/24 55.4 kg  04/09/24 54.2 kg    EKG today demonstrates  Vent. rate 43 BPM PR interval * ms QRS duration 118 ms QT/QTcB 488/412 ms P-R-T axes * 78 61 Junctional bradycardia with Premature supraventricular complexes vs atrial flutter with slow ventricular response Non-specific intra-ventricular conduction delay Nonspecific ST and T wave abnormality Abnormal ECG When compared with ECG of 12-Mar-2024 10:45, Junctional rhythm has replaced Sinus rhythm Vent. rate has decreased BY 31 BPM  Echo 08/03/22 demonstrated   1. Left ventricular ejection fraction, by estimation, is 55 to 60%. The  left ventricle has normal function. The left ventricle has no regional  wall motion abnormalities. Left ventricular diastolic parameters are  consistent with Grade II diastolic  dysfunction (pseudonormalization).   2. Right ventricular systolic function is mildly reduced. The right  ventricular size is mildly enlarged. There is mildly elevated pulmonary  artery systolic pressure. The estimated right  ventricular systolic  pressure is 41.4 mmHg.   3. Left atrial size was moderately dilated.   4. Right atrial size was mildly dilated.   5. The mitral valve is degenerative. Mild to moderate mitral valve  regurgitation. No evidence of mitral stenosis. Moderate to severe mitral  annular calcification.   6. The aortic valve is tricuspid. There is moderate calcification of the  aortic valve. Aortic valve regurgitation is not visualized. Aortic valve  sclerosis is present, with no evidence of aortic valve stenosis.   7. The inferior vena cava is normal in size with <50% respiratory  variability, suggesting right atrial pressure of 8 mmHg.   8. There appears to be a PFO by color doppler.   Epic records reviewed.  CHA2DS2-VASc Score = 7  The patient's score is based upon: CHF History: 0 HTN History: 1 Diabetes History: 1 Stroke History: 2 Vascular Disease History: 1 Age Score: 2 Gender Score: 0      1. Persistent atrial fibrillation/atypical atrial flutter S/p repeat afib ablation with Dr [name] 06/08/19. Per procedure report, flutter unable to be ablated due to complexity of atrial flutter. S/p repeat ablation 11/19/20 S/p successful DCCV on 09/30/23.   He is currently in junctional bradycardia vs atrial flutter with slow ventricular response. Will discontinue Toprol . Will decrease diltiazem  to 120 mg daily and monitor HR at home.   High risk medication monitoring (ICD10: [id number]) Patient requires ongoing monitoring for anti-arrhythmic medication which has the potential to cause life threatening arrhythmias or AV block. Qtc stable. Transition to amiodarone  200 mg once daily.   2. Secondary hypercoagulable state due to atrial fibrillation Continue Eliquis  2.5 mg BID without interruption.  3. Obstructive sleep apnea Patient is compliant with  oral device.  4. CAD/Ischemic CM No chest pain.   5. HTN Stable today.    Follow up 1 week for repeat ECG and DCCV labs if still out  of rhythm.    Woody Heading, PA-C [facility] [address] [place], [place] [address] [phone]

## 2024-05-16 ENCOUNTER — Ambulatory Visit (HOSPITAL_COMMUNITY)
Admission: RE | Admit: 2024-05-16 | Discharge: 2024-05-16 | Disposition: A | Discharge status: Home / Self Care | Source: Ambulatory Visit | Attending: Internal Medicine | Admitting: Internal Medicine

## 2024-05-16 VITALS — BP 158/64 | HR 72 | Ht 66.0 in | Wt 110.0 lb

## 2024-05-16 DIAGNOSIS — I4891 Unspecified atrial fibrillation: Secondary | ICD-10-CM

## 2024-05-16 DIAGNOSIS — D6869 Other thrombophilia: Secondary | ICD-10-CM | POA: Diagnosis not present

## 2024-05-16 DIAGNOSIS — Z5181 Encounter for therapeutic drug level monitoring: Secondary | ICD-10-CM | POA: Diagnosis not present

## 2024-05-16 DIAGNOSIS — I4819 Other persistent atrial fibrillation: Secondary | ICD-10-CM

## 2024-05-16 DIAGNOSIS — Z79899 Other long term (current) drug therapy: Secondary | ICD-10-CM

## 2024-05-16 LAB — CBC
Hematocrit: 48.6 % (ref 37.5–51.0)
Hemoglobin: 16.1 g/dL (ref 13.0–17.7)
MCH: 31.9 pg (ref 26.6–33.0)
MCHC: 33.1 g/dL (ref 31.5–35.7)
MCV: 96 fL (ref 79–97)
Platelets: 156 10*3/uL (ref 150–450)
RBC: 5.05 x10E6/uL (ref 4.14–5.80)
RDW: 13.5 % (ref 11.6–15.4)
WBC: 6.9 10*3/uL (ref 3.4–10.8)

## 2024-05-16 LAB — BASIC METABOLIC PANEL WITH GFR
BUN/Creatinine Ratio: 17 (ref 10–24)
BUN: 23 mg/dL (ref 8–27)
CO2: 30 mmol/L — ABNORMAL HIGH (ref 20–29)
Calcium: 9.8 mg/dL (ref 8.6–10.2)
Chloride: 101 mmol/L (ref 96–106)
Creatinine, Ser: 1.36 mg/dL — ABNORMAL HIGH (ref 0.76–1.27)
Glucose: 111 mg/dL — ABNORMAL HIGH (ref 70–99)
Potassium: 5.1 mmol/L (ref 3.5–5.2)
Sodium: 139 mmol/L (ref 134–144)
eGFR: 51 mL/min/{1.73_m2} — ABNORMAL LOW (ref >59)

## 2024-05-16 NOTE — Progress Notes (Signed)
 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 The patient was initially diagnosed with atrial fibrillation several years ago and is s/p afib ablation 06/25/14 at [facility] by Dr. Quince Weiss and has been maintained on Tikosyn  since. He has had some possible a fib, atrial flutter vs ectopic atrial rhythm. On 12/07/18, he was noted to be in an ectopic atrial rhythm with 2:1 block and was cardioverted on 12/11/18. On review of his ECGs, it appears he had some transient AV dissociation post cardioversion but is back in SR on follow up. He has been asymptomatic. He denies bleeding issues with anticoagulation. He has OSA and struggles with using his CPAP. He is now on an oral device and follows with Dr [name] and Dr [name].  Patient is s/p repeat afib ablation 06/08/19 with Dr [name]. Patient admitted 10/24-10/26/20 with acute CVA after holding anticoagulation for oral surgery. He is on Eliquis  for a CHADS2VASC score of 7. Patient is s/p DCCV 11/20/19. Patient was back in atrial flutter at his visit with Dr [name] on 06/10/20. His BB was stopped at that point 2/2 side effects of fatigue. Patient reports he feels like he has more energy off the BB however, his heart rate had been elevated around 120-130 at home. Patient is s/p DCCV on 07/10/20.   On follow up today, patient is s/p repeat ablation 11/19/20 with Dr [name]. He reports that he has noted an irregular pulse on his home BP machine starting 12/5. He does have some fatigue but otherwise feels well. ECG today shows sinus rhythm with frequent junctional beats. He denies CP, swallowing, or groin issues.  On follow up 10/19/23, he is currently in atrial flutter.  S/p successful DCCV on 09/30/23. He is taking Tikosyn  250 mcg BID. He states he is not surprised he is back out of rhythm.   On follow up 05/09/24, he is currently in junctional bradycardia vs atrial flutter. He was last seen by Dr. [name] on 4/14 and after discussion transitioned from Tikosyn  to amiodarone . He is taking amiodarone  200 mg twice daily. He fell twice yesterday due to being dizzy; he fell on carpet and did not hit his head. No missed doses of Eliquis  2.5 mg BID.   On follow up 05/16/24, he is currently in Afib (HR 72). He was in a marked bradycardia last visit that was possibly underlying atrial flutter. Patient was transitioned to amiodarone  200 mg once daily, Toprol  discontinued (Dr. [name] started it for rate control earlier this year), and diltiazem  decreased to 120 mg daily. He feels ongoing dizziness since last visit. No missed doses of Eliquis  2.5 mg BID.   Today, he denies symptoms of palpitations, SOB, chest pain, orthopnea, PND, lower extremity edema, presyncope, syncope, bleeding, or neurologic sequela. The patient is tolerating medications without difficulties and is otherwise without complaint today.    Atrial Fibrillation Risk Factors:  he does have symptoms or diagnosis of sleep apnea. he is compliant with OSA therapy. Now on oral appliance.    he has a BMI of Body mass index is 17.75 kg/m.[name] Filed Weights   05/16/24 0849  Weight: 49.9 kg     Atrial Fibrillation Management history:  Previous antiarrhythmic drugs: Tikosyn , amiodarone  Previous cardioversions:  12/11/18, 11/20/19, 07/10/20, 09/30/23 Previous ablations: 2015 at [facility], 06/08/19, 11/19/20 CHADS2VASC score: 6 (age, HTN, CAD, CVA) Anticoagulation history: Xarelto , Eliquis    Past Medical History:  Diagnosis Date   Abnormal nuclear cardiac imaging test March 2011   Has positive EKG response, no perfusion defect and normal EF   Adenomatous colon polyp 12/2002   Arrhythmia    Atrial fibrillation (HCC)     a. s/p rfca;  b. chronic tikosyn  and xarelto .   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 04/2017 Echo: EF 65-70%, restrictive physiology, mildly dil LA.   COPD (chronic obstructive pulmonary disease) (HCC)    Coronary artery disease    First MI in 1988. PCI in 1996 with subsequent CABG in 1996 due to restenosis. S/P stents to LCX in 2009. Noted to have residual disease in the LAD in a diffuse manner and atretic LIMA graft. He is managed medically.    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10   OSA (obstructive sleep apnea) 10/16/2018   Severe OSA with AHI 37.2/hr on BiPAP   Persistent atrial fibrillation (HCC)    Polio    age 9   PVC (premature ventricular contraction)    Scoliosis    mild   Past Surgical History:  Procedure Laterality Date   ABLATION     done for a fib   APPENDECTOMY     Age 36   ATRIAL FIBRILLATION ABLATION N/A 06/08/2019   Procedure: ATRIAL FIBRILLATION ABLATION;  Surgeon: Lei Pump, MD;  Location: [facility] INVASIVE CV LAB;  Service: Cardiovascular;  Laterality: N/A;   ATRIAL FIBRILLATION ABLATION N/A 11/19/2020   Procedure: ATRIAL FIBRILLATION ABLATION;  Surgeon: Lei Pump, MD;  Location: [facility] INVASIVE CV LAB;  Service: Cardiovascular;  Laterality: N/A;   CARDIAC CATHETERIZATION     CARDIOVERSION N/A 08/22/2013   Procedure: CARDIOVERSION;  Surgeon: Deena Farrier, MD;  Location: [facility] ENDOSCOPY;   Service: Cardiovascular;  Laterality: N/A;   CARDIOVERSION N/A 01/16/2014   Procedure: CARDIOVERSION;  Surgeon: Lenise Quince, MD;  Location: [facility] ENDOSCOPY;  Service: Cardiovascular;  Laterality: N/A;   CARDIOVERSION N/A 12/11/2018   Procedure: CARDIOVERSION;  Surgeon: Darlis Eisenmenger, MD;  Location: [facility] ENDOSCOPY;  Service: Cardiovascular;  Laterality: N/A;   CARDIOVERSION N/A 11/20/2019   Procedure: CARDIOVERSION;  Surgeon: Wendie Hamburg, MD;  Location: [facility] ENDOSCOPY;  Service: Endoscopy;  Laterality: N/A;   CARDIOVERSION N/A 07/10/2020   Procedure: CARDIOVERSION;  Surgeon: Darlis Eisenmenger, MD;  Location: [facility] ENDOSCOPY;  Service: Cardiovascular;  Laterality: N/A;   CARDIOVERSION N/A 01/21/2021   Procedure: CARDIOVERSION;  Surgeon: Darlis Eisenmenger, MD;  Location: [facility] ENDOSCOPY;  Service: Cardiovascular;  Laterality: N/A;   CARDIOVERSION N/A 09/30/2023   Procedure: CARDIOVERSION;  Surgeon: Olinda Bertrand, DO;  Location: [facility] INVASIVE CV LAB;  Service: Cardiovascular;  Laterality: N/A;   COLONOSCOPY  2008   CORONARY ARTERY BYPASS GRAFT  1996   with a LIMA to the LAD, SVG to RCA and SVG to OM   CORONARY STENT INTERVENTION N/A 03/26/2019   Procedure: CORONARY STENT INTERVENTION;  Surgeon: Lucendia Rusk, MD;  Location: [facility] INVASIVE CV LAB;  Service: Cardiovascular;  Laterality: N/A;   CORONARY STENT PLACEMENT  1996   LCX   EMBOLECTOMY Right 05/09/2019   Procedure: Embolectomy of right radial artery;  Surgeon: Margherita Shell, MD;  Location: [facility] OR;  Service: Vascular;  Laterality: Right;   ESOPHAGEAL DILATION     EYE SURGERY     FALSE ANEURYSM REPAIR Right 05/09/2019   Procedure: REPAIR FALSE ANEURYSM RIGHT RADIAL;  Surgeon: Margherita Shell, MD;  Location: [facility] OR;  Service: Vascular;  Laterality: Right;   LEFT HEART CATH AND CORS/GRAFTS ANGIOGRAPHY N/A 03/26/2019   Procedure: LEFT HEART CATH AND CORS/GRAFTS ANGIOGRAPHY;  Surgeon: Darlis Eisenmenger, MD;  Location: [facility] INVASIVE CV LAB;   Service: Cardiovascular;  Laterality: N/A;   POLYPECTOMY     TONSILLECTOMY     UPPER GASTROINTESTINAL ENDOSCOPY     esophegeal dilation    Current Outpatient Medications  Medication Sig Dispense Refill   acetaminophen  (TYLENOL ) 500 MG tablet Take 250-500 mg by mouth as needed for mild pain (pain score 1-3) or moderate pain (pain score 4-6).     albuterol  (PROAIR  HFA) 108 (90 BASE) MCG/ACT inhaler Inhale 2 puffs into the lungs every 4 (four) hours as needed for wheezing or shortness of breath (and prior to exercise). 1 Inhaler 3   amiodarone  (PACERONE ) 200 MG tablet Take 1 tablet (200 mg total) by mouth daily. 90 tablet 1   Cholecalciferol (VITAMIN D3) 50 MCG (2000 UT) CAPS Take 2,000 Units by mouth every evening.     dicyclomine  (BENTYL ) 10 MG capsule TAKE ONE CAPSULE BY MOUTH 3 TIMES DAILY AS NEEDED FOR SPASMS 270 capsule 3   diltiazem  (CARDIZEM  CD) 120 MG 24 hr capsule Take 1 capsule (120 mg total) by mouth daily. 30 capsule 3   ELIQUIS  2.5 MG TABS tablet TAKE 1 TABLET BY MOUTH TWICE A DAY 180 tablet 3   ezetimibe  (ZETIA ) 10 MG tablet TAKE 1 TABLET BY MOUTH EVERY DAY 90 tablet 3   famotidine  (PEPCID ) 40 MG tablet TAKE 1 TABLET BY MOUTH EVERYDAY AT BEDTIME 90 tablet 3   furosemide  (LASIX ) 20 MG tablet Take 1 tablet (20 mg total) by mouth 2 (two) times a week. 30 tablet 4   JARDIANCE  10 MG TABS tablet TAKE 1 TABLET BY MOUTH EVERY DAY BEFORE BREAKFAST 90 tablet 3   loperamide (IMODIUM) 2 MG capsule Take 2 mg by mouth as needed for diarrhea or loose stools.     LORazepam  (ATIVAN ) 0.5 MG tablet Take 0.5 mg by mouth at bedtime.     methocarbamol  (ROBAXIN ) 500 MG tablet Take 500 mg by mouth 3 (three) times daily as needed for muscle spasms.      nitroGLYCERIN  (NITROSTAT ) 0.4 MG SL tablet Place 1 tablet (0.4 mg total) under the tongue every 5 (five) minutes as needed for chest pain. 25 tablet 0   Probiotic Product (ALIGN) 4 MG CAPS Take 4 mg by mouth daily. (0800)     rosuvastatin  (CRESTOR ) 40 MG  tablet TAKE 1 TABLET BY MOUTH EVERY DAY 90 tablet 3   traZODone  (DESYREL ) 50 MG tablet Take 50 mg by mouth at bedtime.     Evolocumab  (  REPATHA  SURECLICK) 140 MG/ML SOAJ Inject 140 mg into the skin every 14 (fourteen) days. (Patient not taking: Reported on 05/16/2024) 6 mL 3   tadalafil (CIALIS) 10 MG tablet Take 10 mg by mouth daily in the afternoon. Troche* for circulation (Patient not taking: Reported on 05/16/2024)     Testosterone  POWD Apply 100 mg topically daily. Testosterone  (AT) 150/0(150mg ) cpd (Patient not taking: Reported on 05/16/2024)     No current facility-administered medications for this encounter.    Allergies  Allergen Reactions   Morphine  And Codeine Other (See Comments)    IV forms- vein irritation   Tape Other (See Comments)    Skin Irritation (Plastic)   ROS- All systems are reviewed and negative except as per the HPI above.  Physical Exam: Vitals:   05/16/24 0849  BP: (!) 158/64  Pulse: 72  Weight: 49.9 kg  Height: 5\' 6"  (1.676 m)    GEN- The patient is well appearing, alert and oriented x 3 today.   Neck - no JVD or carotid bruit noted Lungs- Clear to ausculation bilaterally, normal work of breathing Heart- Irregular rate and rhythm, no murmurs, rubs or gallops, PMI not laterally displaced Extremities- no clubbing, cyanosis, or edema Skin - no rash or ecchymosis noted   Wt Readings from Last 3 Encounters:  05/16/24 49.9 kg  05/09/24 54.5 kg  05/04/24 55.4 kg    EKG today demonstrates  Vent. rate 72 BPM PR interval * ms QRS duration 118 ms QT/QTcB 364/398 ms P-R-T axes * 83 -59 Atrial fibrillation Non-specific intra-ventricular conduction delay ST & T wave abnormality, consider lateral ischemia Abnormal ECG When compared with ECG of 09-May-2024 09:12, Atrial fibrillation has replaced Junctional rhythm   Echo 08/03/22 demonstrated   1. Left ventricular ejection fraction, by estimation, is 55 to 60%. The  left ventricle has normal function.  The left ventricle has no regional  wall motion abnormalities. Left ventricular diastolic parameters are  consistent with Grade II diastolic  dysfunction (pseudonormalization).   2. Right ventricular systolic function is mildly reduced. The right  ventricular size is mildly enlarged. There is mildly elevated pulmonary  artery systolic pressure. The estimated right ventricular systolic  pressure is 41.4 mmHg.   3. Left atrial size was moderately dilated.   4. Right atrial size was mildly dilated.   5. The mitral valve is degenerative. Mild to moderate mitral valve  regurgitation. No evidence of mitral stenosis. Moderate to severe mitral  annular calcification.   6. The aortic valve is tricuspid. There is moderate calcification of the  aortic valve. Aortic valve regurgitation is not visualized. Aortic valve  sclerosis is present, with no evidence of aortic valve stenosis.   7. The inferior vena cava is normal in size with <50% respiratory  variability, suggesting right atrial pressure of 8 mmHg.   8. There appears to be a PFO by color doppler.   Epic records reviewed.  CHA2DS2-VASc Score = 7  The patient's score is based upon: CHF History: 0 HTN History: 1 Diabetes History: 1 Stroke History: 2 Vascular Disease History: 1 Age Score: 2 Gender Score: 0      1. Persistent atrial fibrillation/atypical atrial flutter S/p repeat afib ablation with Dr [name] 06/08/19. Per procedure report, flutter unable to be ablated due to complexity of atrial flutter. S/p repeat ablation 11/19/20 S/p successful DCCV on 09/30/23.   He is currently in Afib. We discussed the procedure cardioversion to try to convert to NSR. We discussed the risks vs benefits  of this procedure and how ultimately we cannot predict whether a patient will have early return of arrhythmia post procedure. After discussion, the patient wishes to proceed with cardioversion. Labs drawn today. Hold Jardiance  on 5/23. If he has  ongoing dizziness once back in normal rhythm, question whether amiodarone  is contributory to symptom. We did briefly discuss rate control strategy if intolerant of amiodarone .   Informed Consent   Shared Decision Making/Informed Consent The risks (stroke, cardiac arrhythmias rarely resulting in the need for a temporary or permanent pacemaker, skin irritation or burns and complications associated with conscious sedation including aspiration, arrhythmia, respiratory failure and death), benefits (restoration of normal sinus rhythm) and alternatives of a direct current cardioversion were explained in detail to [name] and he agrees to proceed.       High risk medication monitoring (ICD10: [id number]) Patient requires ongoing monitoring for anti-arrhythmic medication which has the potential to cause life threatening arrhythmias or AV block. Qtc stable. Continue amiodarone  200 mg once daily.   2. Secondary hypercoagulable state due to atrial fibrillation No missed doses of Eliquis .  3. Obstructive sleep apnea Patient is compliant with oral device.  4. CAD/Ischemic CM No chest pain.  5. HTN Stable today.    Follow up after DCCV with Dr. Rolm Clos as scheduled.   Woody Heading, PA-C Afib Clinic [facility] [address] [place], [place] [address] [phone]

## 2024-05-16 NOTE — Patient Instructions (Signed)
 Cardioversion scheduled for: Tuesday, May 27th   - Arrive at the [facility] ([address])  and check in with ADMITTING at 11:00AM   - Do not eat or drink anything after midnight the night prior to your procedure.   - Take all your morning medication (except diabetic medications) with a sip of water prior to arrival.  - Do NOT miss any doses of your blood thinner - if you should miss a dose or take a dose more than 4 hours late -- please notify our office immediately.  - You will not be able to drive home after your procedure. Please ensure you have a responsible adult to drive you home. You will need someone with you for 24 hours post procedure.     - Expect to be in the procedural area approximately 2 hours.   - If you feel as if you go back into normal rhythm prior to scheduled cardioversion, please notify our office immediately.   If your procedure is canceled in the cardioversion suite you will be charged a cancellation fee.     Hold below medications 72 hours prior to scheduled procedure/anesthesia. Restart medication on the following day after scheduled procedure/anesthesia  Empagliflozin  (Jardiance )    For those patients who have a scheduled procedure/anesthesia on the same day of the week as their dose, hold the medication on the day of surgery.  They can take their scheduled dose the week before.  **Patients on the above medications scheduled for elective procedures that have not held the medication for the appropriate amount of time are at risk of cancellation or change in the anesthetic plan.  Follow up AFib Clinic in December

## 2024-05-16 NOTE — H&P (View-Only) (Signed)
 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 The patient was initially diagnosed with atrial fibrillation several years ago and is s/p afib ablation 06/25/14 at [facility] by Dr. [name] and has been maintained on Tikosyn  since. He has had some possible a fib, atrial flutter vs ectopic atrial rhythm. On 12/07/18, he was noted to be in an ectopic atrial rhythm with 2:1 block and was cardioverted on 12/11/18. On review of his ECGs, it appears he had some transient AV dissociation post cardioversion but is back in SR on follow up. He has been asymptomatic. He denies bleeding issues with anticoagulation. He has OSA and struggles with using his CPAP. He is now on an oral device and follows with Dr [name] and Dr [name].  Patient is s/p repeat afib ablation 06/08/19 with Dr [name]. Patient admitted 10/24-10/26/20 with acute CVA after holding anticoagulation for oral surgery. He is on Eliquis  for a CHADS2VASC score of 7. Patient is s/p DCCV 11/20/19. Patient was back in atrial flutter at his visit with Dr [name] on 06/10/20. His BB was stopped at that point 2/2 side effects of fatigue. Patient reports he feels like he has more energy off the BB however, his heart rate had been elevated around 120-130 at home. Patient is s/p DCCV on 07/10/20.   On follow up today, patient is s/p repeat ablation 11/19/20 with Dr [name]. He reports that he has noted an irregular pulse on his home BP machine starting 12/5. He does have some fatigue but otherwise feels well. ECG today shows sinus rhythm with frequent junctional beats. He denies CP, swallowing, or groin issues.  On follow up 10/19/23, he is currently in atrial flutter.  S/p successful DCCV on 09/30/23. He is taking Tikosyn  250 mcg BID. He states he is not surprised he is back out of rhythm.   On follow up 05/09/24, he is currently in junctional bradycardia vs atrial flutter. He was last seen by Dr. [name] on 4/14 and after discussion transitioned from Tikosyn  to amiodarone . He is taking amiodarone  200 mg twice daily. He fell twice yesterday due to being dizzy; he fell on carpet and did not hit his head. No missed doses of Eliquis  2.5 mg BID.   On follow up 05/16/24, he is currently in Afib (HR 72). He was in a marked bradycardia last visit that was possibly underlying atrial flutter. Patient was transitioned to amiodarone  200 mg once daily, Toprol  discontinued (Dr. [name] started it for rate control earlier this year), and diltiazem  decreased to 120 mg daily. He feels ongoing dizziness since last visit. No missed doses of Eliquis  2.5 mg BID.   Today, he denies symptoms of palpitations, SOB, chest pain, orthopnea, PND, lower extremity edema, presyncope, syncope, bleeding, or neurologic sequela. The patient is tolerating medications without difficulties and is otherwise without complaint today.    Atrial Fibrillation Risk Factors:  he does have symptoms or diagnosis of sleep apnea. he is compliant with OSA therapy. Now on oral appliance.    he has a BMI of Body mass index is 17.75 kg/m.Aaron Aas Filed Weights   05/16/24 0849  Weight: 49.9 kg     Atrial Fibrillation Management history:  Previous antiarrhythmic drugs: Tikosyn , amiodarone  Previous cardioversions:  12/11/18, 11/20/19, 07/10/20, 09/30/23 Previous ablations: 2015 at [facility], 06/08/19, 11/19/20 CHADS2VASC score: 6 (age, HTN, CAD, CVA) Anticoagulation history: Xarelto , Eliquis    Past Medical History:  Diagnosis Date   Abnormal nuclear cardiac imaging test March 2011   Has positive EKG response, no perfusion defect and normal EF   Adenomatous colon polyp 12/2002   Arrhythmia    Atrial fibrillation (HCC)     a. s/p rfca;  b. chronic tikosyn  and xarelto .   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 04/2017 Echo: EF 65-70%, restrictive physiology, mildly dil LA.   COPD (chronic obstructive pulmonary disease) (HCC)    Coronary artery disease    First MI in 1988. PCI in 1996 with subsequent CABG in 1996 due to restenosis. S/P stents to LCX in 2009. Noted to have residual disease in the LAD in a diffuse manner and atretic LIMA graft. He is managed medically.    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10   OSA (obstructive sleep apnea) 10/16/2018   Severe OSA with AHI 37.2/hr on BiPAP   Persistent atrial fibrillation (HCC)    Polio    age 9   PVC (premature ventricular contraction)    Scoliosis    mild   Past Surgical History:  Procedure Laterality Date   ABLATION     done for a fib   APPENDECTOMY     Age 36   ATRIAL FIBRILLATION ABLATION N/A 06/08/2019   Procedure: ATRIAL FIBRILLATION ABLATION;  Surgeon: Lei Pump, MD;  Location: [facility] INVASIVE CV LAB;  Service: Cardiovascular;  Laterality: N/A;   ATRIAL FIBRILLATION ABLATION N/A 11/19/2020   Procedure: ATRIAL FIBRILLATION ABLATION;  Surgeon: Lei Pump, MD;  Location: [facility] INVASIVE CV LAB;  Service: Cardiovascular;  Laterality: N/A;   CARDIAC CATHETERIZATION     CARDIOVERSION N/A 08/22/2013   Procedure: CARDIOVERSION;  Surgeon: Deena Farrier, MD;  Location: [facility] ENDOSCOPY;   Service: Cardiovascular;  Laterality: N/A;   CARDIOVERSION N/A 01/16/2014   Procedure: CARDIOVERSION;  Surgeon: Lenise Quince, MD;  Location: [facility] ENDOSCOPY;  Service: Cardiovascular;  Laterality: N/A;   CARDIOVERSION N/A 12/11/2018   Procedure: CARDIOVERSION;  Surgeon: Darlis Eisenmenger, MD;  Location: [facility] ENDOSCOPY;  Service: Cardiovascular;  Laterality: N/A;   CARDIOVERSION N/A 11/20/2019   Procedure: CARDIOVERSION;  Surgeon: Wendie Hamburg, MD;  Location: [facility] ENDOSCOPY;  Service: Endoscopy;  Laterality: N/A;   CARDIOVERSION N/A 07/10/2020   Procedure: CARDIOVERSION;  Surgeon: Darlis Eisenmenger, MD;  Location: [facility] ENDOSCOPY;  Service: Cardiovascular;  Laterality: N/A;   CARDIOVERSION N/A 01/21/2021   Procedure: CARDIOVERSION;  Surgeon: Darlis Eisenmenger, MD;  Location: [facility] ENDOSCOPY;  Service: Cardiovascular;  Laterality: N/A;   CARDIOVERSION N/A 09/30/2023   Procedure: CARDIOVERSION;  Surgeon: Olinda Bertrand, DO;  Location: [facility] INVASIVE CV LAB;  Service: Cardiovascular;  Laterality: N/A;   COLONOSCOPY  2008   CORONARY ARTERY BYPASS GRAFT  1996   with a LIMA to the LAD, SVG to RCA and SVG to OM   CORONARY STENT INTERVENTION N/A 03/26/2019   Procedure: CORONARY STENT INTERVENTION;  Surgeon: Lucendia Rusk, MD;  Location: [facility] INVASIVE CV LAB;  Service: Cardiovascular;  Laterality: N/A;   CORONARY STENT PLACEMENT  1996   LCX   EMBOLECTOMY Right 05/09/2019   Procedure: Embolectomy of right radial artery;  Surgeon: Margherita Shell, MD;  Location: [facility] OR;  Service: Vascular;  Laterality: Right;   ESOPHAGEAL DILATION     EYE SURGERY     FALSE ANEURYSM REPAIR Right 05/09/2019   Procedure: REPAIR FALSE ANEURYSM RIGHT RADIAL;  Surgeon: Margherita Shell, MD;  Location: [facility] OR;  Service: Vascular;  Laterality: Right;   LEFT HEART CATH AND CORS/GRAFTS ANGIOGRAPHY N/A 03/26/2019   Procedure: LEFT HEART CATH AND CORS/GRAFTS ANGIOGRAPHY;  Surgeon: Darlis Eisenmenger, MD;  Location: [facility] INVASIVE CV LAB;   Service: Cardiovascular;  Laterality: N/A;   POLYPECTOMY     TONSILLECTOMY     UPPER GASTROINTESTINAL ENDOSCOPY     esophegeal dilation    Current Outpatient Medications  Medication Sig Dispense Refill   acetaminophen  (TYLENOL ) 500 MG tablet Take 250-500 mg by mouth as needed for mild pain (pain score 1-3) or moderate pain (pain score 4-6).     albuterol  (PROAIR  HFA) 108 (90 BASE) MCG/ACT inhaler Inhale 2 puffs into the lungs every 4 (four) hours as needed for wheezing or shortness of breath (and prior to exercise). 1 Inhaler 3   amiodarone  (PACERONE ) 200 MG tablet Take 1 tablet (200 mg total) by mouth daily. 90 tablet 1   Cholecalciferol (VITAMIN D3) 50 MCG (2000 UT) CAPS Take 2,000 Units by mouth every evening.     dicyclomine  (BENTYL ) 10 MG capsule TAKE ONE CAPSULE BY MOUTH 3 TIMES DAILY AS NEEDED FOR SPASMS 270 capsule 3   diltiazem  (CARDIZEM  CD) 120 MG 24 hr capsule Take 1 capsule (120 mg total) by mouth daily. 30 capsule 3   ELIQUIS  2.5 MG TABS tablet TAKE 1 TABLET BY MOUTH TWICE A DAY 180 tablet 3   ezetimibe  (ZETIA ) 10 MG tablet TAKE 1 TABLET BY MOUTH EVERY DAY 90 tablet 3   famotidine  (PEPCID ) 40 MG tablet TAKE 1 TABLET BY MOUTH EVERYDAY AT BEDTIME 90 tablet 3   furosemide  (LASIX ) 20 MG tablet Take 1 tablet (20 mg total) by mouth 2 (two) times a week. 30 tablet 4   JARDIANCE  10 MG TABS tablet TAKE 1 TABLET BY MOUTH EVERY DAY BEFORE BREAKFAST 90 tablet 3   loperamide (IMODIUM) 2 MG capsule Take 2 mg by mouth as needed for diarrhea or loose stools.     LORazepam  (ATIVAN ) 0.5 MG tablet Take 0.5 mg by mouth at bedtime.     methocarbamol  (ROBAXIN ) 500 MG tablet Take 500 mg by mouth 3 (three) times daily as needed for muscle spasms.      nitroGLYCERIN  (NITROSTAT ) 0.4 MG SL tablet Place 1 tablet (0.4 mg total) under the tongue every 5 (five) minutes as needed for chest pain. 25 tablet 0   Probiotic Product (ALIGN) 4 MG CAPS Take 4 mg by mouth daily. (0800)     rosuvastatin  (CRESTOR ) 40 MG  tablet TAKE 1 TABLET BY MOUTH EVERY DAY 90 tablet 3   traZODone  (DESYREL ) 50 MG tablet Take 50 mg by mouth at bedtime.     Evolocumab  (  REPATHA  SURECLICK) 140 MG/ML SOAJ Inject 140 mg into the skin every 14 (fourteen) days. (Patient not taking: Reported on 05/16/2024) 6 mL 3   tadalafil (CIALIS) 10 MG tablet Take 10 mg by mouth daily in the afternoon. Troche* for circulation (Patient not taking: Reported on 05/16/2024)     Testosterone  POWD Apply 100 mg topically daily. Testosterone  (AT) 150/0(150mg ) cpd (Patient not taking: Reported on 05/16/2024)     No current facility-administered medications for this encounter.    Allergies  Allergen Reactions   Morphine  And Codeine Other (See Comments)    IV forms- vein irritation   Tape Other (See Comments)    Skin Irritation (Plastic)   ROS- All systems are reviewed and negative except as per the HPI above.  Physical Exam: Vitals:   05/16/24 0849  BP: (!) 158/64  Pulse: 72  Weight: 49.9 kg  Height: 5\' 6"  (1.676 m)    GEN- The patient is well appearing, alert and oriented x 3 today.   Neck - no JVD or carotid bruit noted Lungs- Clear to ausculation bilaterally, normal work of breathing Heart- Irregular rate and rhythm, no murmurs, rubs or gallops, PMI not laterally displaced Extremities- no clubbing, cyanosis, or edema Skin - no rash or ecchymosis noted   Wt Readings from Last 3 Encounters:  05/16/24 49.9 kg  05/09/24 54.5 kg  05/04/24 55.4 kg    EKG today demonstrates  Vent. rate 72 BPM PR interval * ms QRS duration 118 ms QT/QTcB 364/398 ms P-R-T axes * 83 -59 Atrial fibrillation Non-specific intra-ventricular conduction delay ST & T wave abnormality, consider lateral ischemia Abnormal ECG When compared with ECG of 09-May-2024 09:12, Atrial fibrillation has replaced Junctional rhythm   Echo 08/03/22 demonstrated   1. Left ventricular ejection fraction, by estimation, is 55 to 60%. The  left ventricle has normal function.  The left ventricle has no regional  wall motion abnormalities. Left ventricular diastolic parameters are  consistent with Grade II diastolic  dysfunction (pseudonormalization).   2. Right ventricular systolic function is mildly reduced. The right  ventricular size is mildly enlarged. There is mildly elevated pulmonary  artery systolic pressure. The estimated right ventricular systolic  pressure is 41.4 mmHg.   3. Left atrial size was moderately dilated.   4. Right atrial size was mildly dilated.   5. The mitral valve is degenerative. Mild to moderate mitral valve  regurgitation. No evidence of mitral stenosis. Moderate to severe mitral  annular calcification.   6. The aortic valve is tricuspid. There is moderate calcification of the  aortic valve. Aortic valve regurgitation is not visualized. Aortic valve  sclerosis is present, with no evidence of aortic valve stenosis.   7. The inferior vena cava is normal in size with <50% respiratory  variability, suggesting right atrial pressure of 8 mmHg.   8. There appears to be a PFO by color doppler.   Epic records reviewed.  CHA2DS2-VASc Score = 7  The patient's score is based upon: CHF History: 0 HTN History: 1 Diabetes History: 1 Stroke History: 2 Vascular Disease History: 1 Age Score: 2 Gender Score: 0      1. Persistent atrial fibrillation/atypical atrial flutter S/p repeat afib ablation with Dr [name] 06/08/19. Per procedure report, flutter unable to be ablated due to complexity of atrial flutter. S/p repeat ablation 11/19/20 S/p successful DCCV on 09/30/23.   He is currently in Afib. We discussed the procedure cardioversion to try to convert to NSR. We discussed the risks vs benefits  of this procedure and how ultimately we cannot predict whether a patient will have early return of arrhythmia post procedure. After discussion, the patient wishes to proceed with cardioversion. Labs drawn today. Hold Jardiance  on 5/23. If he has  ongoing dizziness once back in normal rhythm, question whether amiodarone  is contributory to symptom. We did briefly discuss rate control strategy if intolerant of amiodarone .   Informed Consent   Shared Decision Making/Informed Consent The risks (stroke, cardiac arrhythmias rarely resulting in the need for a temporary or permanent pacemaker, skin irritation or burns and complications associated with conscious sedation including aspiration, arrhythmia, respiratory failure and death), benefits (restoration of normal sinus rhythm) and alternatives of a direct current cardioversion were explained in detail to Mr. Krahenbuhl and he agrees to proceed.       High risk medication monitoring (ICD10: [id number]) Patient requires ongoing monitoring for anti-arrhythmic medication which has the potential to cause life threatening arrhythmias or AV block. Qtc stable. Continue amiodarone  200 mg once daily.   2. Secondary hypercoagulable state due to atrial fibrillation No missed doses of Eliquis .  3. Obstructive sleep apnea Patient is compliant with oral device.  4. CAD/Ischemic CM No chest pain.  5. HTN Stable today.    Follow up after DCCV with Dr. Rolm Clos as scheduled.   Woody Heading, PA-C Afib Clinic [facility] [address] [place], [place] [address] [phone]

## 2024-05-17 ENCOUNTER — Ambulatory Visit (HOSPITAL_COMMUNITY): Payer: Self-pay | Admitting: Internal Medicine

## 2024-05-18 NOTE — Progress Notes (Signed)
 Called patient with pre-procedure instructions for Tuesday May 22, 2024. Generic voicemail left for patient

## 2024-05-22 ENCOUNTER — Ambulatory Visit (HOSPITAL_COMMUNITY): Admitting: Anesthesiology

## 2024-05-22 ENCOUNTER — Ambulatory Visit (HOSPITAL_COMMUNITY)
Admission: RE | Admit: 2024-05-22 | Discharge: 2024-05-22 | Disposition: A | Discharge status: Home / Self Care | Attending: Cardiology | Admitting: Cardiology

## 2024-05-22 ENCOUNTER — Encounter (HOSPITAL_COMMUNITY)
Admission: RE | Disposition: A | Discharge status: Home / Self Care | Payer: Self-pay | Source: Home / Self Care | Attending: Cardiology

## 2024-05-22 ENCOUNTER — Other Ambulatory Visit: Payer: Self-pay

## 2024-05-22 ENCOUNTER — Encounter (HOSPITAL_COMMUNITY): Payer: Self-pay | Admitting: Cardiology

## 2024-05-22 DIAGNOSIS — I4891 Unspecified atrial fibrillation: Secondary | ICD-10-CM

## 2024-05-22 DIAGNOSIS — J449 Chronic obstructive pulmonary disease, unspecified: Secondary | ICD-10-CM | POA: Diagnosis not present

## 2024-05-22 DIAGNOSIS — K219 Gastro-esophageal reflux disease without esophagitis: Secondary | ICD-10-CM | POA: Insufficient documentation

## 2024-05-22 DIAGNOSIS — G4733 Obstructive sleep apnea (adult) (pediatric): Secondary | ICD-10-CM | POA: Insufficient documentation

## 2024-05-22 DIAGNOSIS — I25110 Atherosclerotic heart disease of native coronary artery with unstable angina pectoris: Secondary | ICD-10-CM

## 2024-05-22 DIAGNOSIS — I5032 Chronic diastolic (congestive) heart failure: Secondary | ICD-10-CM | POA: Diagnosis not present

## 2024-05-22 DIAGNOSIS — I5033 Acute on chronic diastolic (congestive) heart failure: Secondary | ICD-10-CM

## 2024-05-22 DIAGNOSIS — I252 Old myocardial infarction: Secondary | ICD-10-CM | POA: Insufficient documentation

## 2024-05-22 DIAGNOSIS — Z955 Presence of coronary angioplasty implant and graft: Secondary | ICD-10-CM | POA: Diagnosis not present

## 2024-05-22 DIAGNOSIS — Z87891 Personal history of nicotine dependence: Secondary | ICD-10-CM | POA: Insufficient documentation

## 2024-05-22 DIAGNOSIS — I110 Hypertensive heart disease with heart failure: Secondary | ICD-10-CM

## 2024-05-22 DIAGNOSIS — Z8673 Personal history of transient ischemic attack (TIA), and cerebral infarction without residual deficits: Secondary | ICD-10-CM | POA: Diagnosis not present

## 2024-05-22 DIAGNOSIS — Z951 Presence of aortocoronary bypass graft: Secondary | ICD-10-CM | POA: Diagnosis not present

## 2024-05-22 DIAGNOSIS — I2510 Atherosclerotic heart disease of native coronary artery without angina pectoris: Secondary | ICD-10-CM | POA: Insufficient documentation

## 2024-05-22 DIAGNOSIS — I4819 Other persistent atrial fibrillation: Secondary | ICD-10-CM | POA: Insufficient documentation

## 2024-05-22 DIAGNOSIS — Z7901 Long term (current) use of anticoagulants: Secondary | ICD-10-CM | POA: Diagnosis not present

## 2024-05-22 DIAGNOSIS — I484 Atypical atrial flutter: Secondary | ICD-10-CM | POA: Insufficient documentation

## 2024-05-22 DIAGNOSIS — I255 Ischemic cardiomyopathy: Secondary | ICD-10-CM | POA: Diagnosis not present

## 2024-05-22 DIAGNOSIS — Z79899 Other long term (current) drug therapy: Secondary | ICD-10-CM | POA: Diagnosis not present

## 2024-05-22 HISTORY — PX: CARDIOVERSION: EP1203

## 2024-05-22 SURGERY — CARDIOVERSION (CATH LAB)
Anesthesia: General

## 2024-05-22 MED ORDER — DILTIAZEM HCL ER COATED BEADS 120 MG PO CP24: 120.0000 mg | ORAL_CAPSULE | Freq: Every day | ORAL | Status: DC

## 2024-05-22 MED ORDER — SODIUM CHLORIDE 0.9% FLUSH: 3.0000 mL | Freq: Two times a day (BID) | INTRAVENOUS | Status: DC

## 2024-05-22 MED ORDER — PROPOFOL 10 MG/ML IV BOLUS
INTRAVENOUS | Status: DC | PRN
  Administered 2024-05-22: 50 mg via INTRAVENOUS

## 2024-05-22 MED ORDER — SODIUM CHLORIDE 0.9% FLUSH: 3.0000 mL | INTRAVENOUS | Status: DC | PRN

## 2024-05-22 MED ORDER — LIDOCAINE 2% (20 MG/ML) 5 ML SYRINGE
INTRAMUSCULAR | Status: DC | PRN
  Administered 2024-05-22: 100 mg via INTRAVENOUS

## 2024-05-22 SURGICAL SUPPLY — 1 items: PAD DEFIB RADIO PHYSIO CONN (PAD) ×1 IMPLANT

## 2024-05-22 NOTE — Transfer of Care (Signed)
 Immediate Anesthesia Transfer of Care Note  Patient: [name]  Procedure(s) Performed: CARDIOVERSION  Patient Location: Cath Lab  Anesthesia Type:General  Level of Consciousness: drowsy  Airway & Oxygen  Therapy: Patient Spontanous Breathing and Patient connected to nasal cannula oxygen   Post-op Assessment: Report given to RN and Post -op Vital signs reviewed and stable  Post vital signs: Reviewed and stable  Last Vitals:  Vitals Value Taken Time  BP 111/79 05/22/24 1130  Temp    Pulse 78 05/22/24 1129  Resp 24 05/22/24 1129  SpO2 94 % 05/22/24 1129  Vitals shown include unfiled device data.  Last Pain:  Vitals:   05/22/24 1054  TempSrc:   PainSc: 0-No pain         Complications: No notable events documented.

## 2024-05-22 NOTE — Anesthesia Preprocedure Evaluation (Signed)
Anesthesia Evaluation  Patient identified by MRN, date of birth, ID band Patient awake    Reviewed: Allergy & Precautions, Patient's Chart, lab work & pertinent test results  History of Anesthesia Complications (+) DIFFICULT AIRWAY and history of anesthetic complications  Airway Mallampati: II  TM Distance: <3 FB Neck ROM: Full    Dental  (+) Teeth Intact, Dental Advisory Given   Pulmonary sleep apnea , COPD,  COPD inhaler, former smoker   Pulmonary exam normal breath sounds clear to auscultation       Cardiovascular hypertension, Pt. on medications pulmonary hypertension(-) angina + CAD, + Past MI, + Cardiac Stents, + CABG and +CHF  + dysrhythmias Atrial Fibrillation + Valvular Problems/Murmurs MR  Rhythm:Irregular Rate:Abnormal  Echo 07/2022  1. Left ventricular ejection fraction, by estimation, is 55 to 60%. The left ventricle has normal function. The left ventricle has no regional wall motion abnormalities. Left ventricular diastolic parameters are consistent with Grade II diastolic dysfunction (pseudonormalization).   2. Right ventricular systolic function is mildly reduced. The right ventricular size is mildly enlarged. There is mildly elevated pulmonary artery systolic pressure. The estimated right ventricular systolic pressure is 41.4 mmHg.   3. Left atrial size was moderately dilated.   4. Right atrial size was mildly dilated.   5. The mitral valve is degenerative. Mild to moderate mitral valve regurgitation. No evidence of mitral stenosis. Moderate to severe mitral annular calcification.   6. The aortic valve is tricuspid. There is moderate calcification of the aortic valve. Aortic valve regurgitation is not visualized. Aortic valve sclerosis is present, with no evidence of aortic valve stenosis.   7. The inferior vena cava is normal in size with <50% respiratory variability, suggesting right atrial pressure of 8 mmHg.   8.  There appears to be a PFO by color doppler.      Neuro/Psych CVA, Residual Symptoms    GI/Hepatic Neg liver ROS,GERD  Medicated,,  Endo/Other  negative endocrine ROS    Renal/GU negative Renal ROS     Musculoskeletal negative musculoskeletal ROS (+)    Abdominal   Peds  Hematology  (+) Blood dyscrasia (Eliquis)   Anesthesia Other Findings Day of surgery medications reviewed with the patient.  Reproductive/Obstetrics                             Anesthesia Physical Anesthesia Plan  ASA: 4  Anesthesia Plan: General   Post-op Pain Management: Minimal or no pain anticipated   Induction: Intravenous  PONV Risk Score and Plan: 2 and Propofol infusion, Treatment may vary due to age or medical condition and TIVA  Airway Management Planned: Mask  Additional Equipment:   Intra-op Plan:   Post-operative Plan:   Informed Consent: I have reviewed the patients History and Physical, chart, labs and discussed the procedure including the risks, benefits and alternatives for the proposed anesthesia with the patient or authorized representative who has indicated his/her understanding and acceptance.     Dental advisory given  Plan Discussed with: CRNA  Anesthesia Plan Comments:         Anesthesia Quick Evaluation

## 2024-05-22 NOTE — Interval H&P Note (Signed)
 History and Physical Interval Note:  05/22/2024 11:30 AM  [name]  has presented today for surgery, with the diagnosis of AFIB.  The various methods of treatment have been discussed with the patient and family. After consideration of risks, benefits and other options for treatment, the patient has consented to  Procedure(s): CARDIOVERSION (N/A) as a surgical intervention.  The patient's history has been reviewed, patient examined, no change in status, stable for surgery.  I have reviewed the patient's chart and labs.  Questions were answered to the patient's satisfaction.    No missed doses of Eliquis  over the last three weeks per patient.  Contact person: Wife or daughter   Informed Consent   Shared Decision Making/Informed Consent The risks (stroke, cardiac arrhythmias rarely resulting in the need for a temporary or permanent pacemaker, skin irritation or burns and complications associated with conscious sedation including aspiration, arrhythmia, respiratory failure and death), benefits (restoration of normal sinus rhythm) and alternatives of a direct current cardioversion were explained in detail to [name] and he agrees to proceed.        Awilda Bogus, [facility]  A Division of [facility] [address], [place], [place] [address]  [place], [place] [address] 11:31 AM 05/22/24

## 2024-05-22 NOTE — Anesthesia Postprocedure Evaluation (Signed)
 Anesthesia Post Note  Patient: [name]  Procedure(s) Performed: CARDIOVERSION     Patient location during evaluation: PACU Anesthesia Type: General Level of consciousness: awake and alert Pain management: pain level controlled Vital Signs Assessment: post-procedure vital signs reviewed and stable Respiratory status: spontaneous breathing, nonlabored ventilation and respiratory function stable Cardiovascular status: blood pressure returned to baseline and stable Postop Assessment: no apparent nausea or vomiting Anesthetic complications: no   No notable events documented.  Last Vitals:  Vitals:   05/22/24 1230 05/22/24 1236  BP: (!) 115/91 126/82  Pulse: 70 73  Resp: 18 (!) 21  Temp: 36.7 C 36.7 C  SpO2: 98% 98%    Last Pain:  Vitals:   05/22/24 1236  TempSrc: Temporal  PainSc: 0-No pain   Pain Goal:                   [name]

## 2024-05-22 NOTE — CV Procedure (Addendum)
   DIRECT CURRENT CARDIOVERSION  NAME:  [name]    MRN: [id number] DOB:  24-Mar-1936    ADMIT DATE: 05/22/2024  Indication:  Symptomatic atrial fibrillation  Procedure Note:  The patient signed informed consent.  They have had had therapeutic anticoagulation with Eliquis  greater than 3 weeks.  Anesthesia was administered by Dr. Annabell Key.  Adequate airway was maintained throughout and vital followed per protocol.  They were cardioverted x 1 with 150J of biphasic synchronized energy.  They converted to Junctional rhythm followed by sinus bradycardia.  There were no apparent complications.  The patient had normal neuro status and respiratory status post procedure with vitals stable as recorded elsewhere.    Wife phone number goes to voicemail.  Updated daughter post procedure - all questions answered and medication recommendations conveyed to her and patient.  AVS updated  Holding parameters on diltiazem : Hold if systolic blood pressure (top number) less than 100 mmHg or pulse less than 55 bpm. Ambulated patient on the floors - asymptomatic and HR increased appropriately up to mid 70bpm.   Follow up: They will continue on current medical therapy and follow up with cardiology as scheduled.  Awilda Bogus, [facility]  A Division of [facility] [address], [place], [place] [address]  [place], [place] [address] 12:58 PM 12:58 PM

## 2024-06-07 ENCOUNTER — Ambulatory Visit (HOSPITAL_COMMUNITY)
Admission: RE | Admit: 2024-06-07 | Discharge: 2024-06-07 | Disposition: A | Discharge status: Home / Self Care | Source: Ambulatory Visit | Attending: Cardiology | Admitting: Cardiology

## 2024-06-07 ENCOUNTER — Encounter (HOSPITAL_COMMUNITY): Payer: Self-pay | Admitting: Cardiology

## 2024-06-07 VITALS — BP 122/64 | HR 82 | Ht 66.0 in | Wt 109.6 lb

## 2024-06-07 DIAGNOSIS — I480 Paroxysmal atrial fibrillation: Secondary | ICD-10-CM

## 2024-06-07 DIAGNOSIS — G4733 Obstructive sleep apnea (adult) (pediatric): Secondary | ICD-10-CM | POA: Diagnosis not present

## 2024-06-07 DIAGNOSIS — Z7901 Long term (current) use of anticoagulants: Secondary | ICD-10-CM | POA: Diagnosis not present

## 2024-06-07 DIAGNOSIS — I2510 Atherosclerotic heart disease of native coronary artery without angina pectoris: Secondary | ICD-10-CM | POA: Insufficient documentation

## 2024-06-07 DIAGNOSIS — Z7984 Long term (current) use of oral hypoglycemic drugs: Secondary | ICD-10-CM | POA: Insufficient documentation

## 2024-06-07 DIAGNOSIS — I252 Old myocardial infarction: Secondary | ICD-10-CM | POA: Insufficient documentation

## 2024-06-07 DIAGNOSIS — I5022 Chronic systolic (congestive) heart failure: Secondary | ICD-10-CM

## 2024-06-07 DIAGNOSIS — Z8673 Personal history of transient ischemic attack (TIA), and cerebral infarction without residual deficits: Secondary | ICD-10-CM | POA: Diagnosis not present

## 2024-06-07 DIAGNOSIS — Z955 Presence of coronary angioplasty implant and graft: Secondary | ICD-10-CM | POA: Diagnosis not present

## 2024-06-07 DIAGNOSIS — Z951 Presence of aortocoronary bypass graft: Secondary | ICD-10-CM | POA: Diagnosis not present

## 2024-06-07 DIAGNOSIS — I340 Nonrheumatic mitral (valve) insufficiency: Secondary | ICD-10-CM | POA: Insufficient documentation

## 2024-06-07 DIAGNOSIS — Z87891 Personal history of nicotine dependence: Secondary | ICD-10-CM | POA: Insufficient documentation

## 2024-06-07 DIAGNOSIS — I5032 Chronic diastolic (congestive) heart failure: Secondary | ICD-10-CM | POA: Diagnosis not present

## 2024-06-07 DIAGNOSIS — G14 Postpolio syndrome: Secondary | ICD-10-CM | POA: Insufficient documentation

## 2024-06-07 DIAGNOSIS — Z79899 Other long term (current) drug therapy: Secondary | ICD-10-CM | POA: Diagnosis not present

## 2024-06-07 DIAGNOSIS — I493 Ventricular premature depolarization: Secondary | ICD-10-CM | POA: Insufficient documentation

## 2024-06-07 DIAGNOSIS — E785 Hyperlipidemia, unspecified: Secondary | ICD-10-CM | POA: Insufficient documentation

## 2024-06-07 DIAGNOSIS — I484 Atypical atrial flutter: Secondary | ICD-10-CM

## 2024-06-07 LAB — COMPREHENSIVE METABOLIC PANEL WITH GFR
ALT: 81 U/L — ABNORMAL HIGH (ref 0–44)
AST: 54 U/L — ABNORMAL HIGH (ref 15–41)
Albumin: 3.8 g/dL (ref 3.5–5.0)
Alkaline Phosphatase: 134 U/L — ABNORMAL HIGH (ref 38–126)
Anion gap: 11 (ref 5–15)
BUN: 19 mg/dL (ref 8–23)
CO2: 27 mmol/L (ref 22–32)
Calcium: 9.5 mg/dL (ref 8.9–10.3)
Chloride: 97 mmol/L — ABNORMAL LOW (ref 98–111)
Creatinine, Ser: 1.34 mg/dL — ABNORMAL HIGH (ref 0.61–1.24)
GFR, Estimated: 52 mL/min — ABNORMAL LOW (ref >60)
Glucose, Bld: 94 mg/dL (ref 70–99)
Potassium: 4.5 mmol/L (ref 3.5–5.1)
Sodium: 135 mmol/L (ref 135–145)
Total Bilirubin: 1.3 mg/dL — ABNORMAL HIGH (ref 0.0–1.2)
Total Protein: 7 g/dL (ref 6.5–8.1)

## 2024-06-07 LAB — TSH: TSH: 2.774 u[IU]/mL (ref 0.350–4.500)

## 2024-06-07 LAB — BRAIN NATRIURETIC PEPTIDE: B Natriuretic Peptide: 164.1 pg/mL — ABNORMAL HIGH (ref 0.0–100.0)

## 2024-06-07 MED ORDER — METOPROLOL SUCCINATE ER 25 MG PO TB24: 25.0000 mg | ORAL_TABLET | Freq: Every day | ORAL | 6 refills | Status: DC

## 2024-06-07 NOTE — Patient Instructions (Signed)
 Great to see you today!!!  Medication Changes:  STOP Amiodarone   START Metoprolol  XL 25 mg Daily  Lab Work:  Labs done today, your results will be available in MyChart, we will contact you for abnormal readings.   Testing/Procedures:  Your physician has requested that you have an echocardiogram. Echocardiography is a painless test that uses sound waves to create images of your heart. It provides your doctor with information about the size and shape of your heart and how well your heart's chambers and valves are working. This procedure takes approximately one hour. There are no restrictions for this procedure. Please do NOT wear cologne, perfume, aftershave, or lotions (deodorant is allowed). Please arrive 15 minutes prior to your appointment time.  Please note: We ask at that you not bring children with you during ultrasound (echo/ vascular) testing. Due to room size and safety concerns, children are not allowed in the ultrasound rooms during exams. Our front office staff cannot provide observation of children in our lobby area while testing is being conducted. An adult accompanying a patient to their appointment will only be allowed in the ultrasound room at the discretion of the ultrasound technician under special circumstances. We apologize for any inconvenience.   Special Instructions // Education:  Do the following things EVERYDAY: Weigh yourself in the morning before breakfast. Write it down and keep it in a log. Take your medicines as prescribed Eat low salt foods--Limit salt (sodium) to 2000 mg per day.  Stay as active as you can everyday Limit all fluids for the day to less than 2 liters   Follow-Up in: 2 months   At the [facility], you and your health needs are our priority. We have a designated team specialized in the treatment of Heart Failure. This Care Team includes your primary Heart Failure Specialized Cardiologist (physician), Advanced Practice  Providers (APPs- Physician Assistants and Nurse Practitioners), and Pharmacist who all work together to provide you with the care you need, when you need it.   You may see any of the following providers on your designated Care Team at your next follow up:  Dr. Jules Oar Dr. Peder Bourdon Dr. Alwin Baars Dr. Judyth Nunnery Nieves Bars, NP Ruddy Corral, [place] [facility] [place], [place] Dennise Fitz, NP [place] Lee, NP Luster Salters, PharmD   Please be sure to bring in all your medications bottles to every appointment.   Need to Contact Us :  If you have any questions or concerns before your next appointment please send us  a message through [place] or call our office at [phone].    TO LEAVE A MESSAGE FOR THE NURSE SELECT OPTION 2, PLEASE LEAVE A MESSAGE INCLUDING: YOUR NAME DATE OF BIRTH CALL BACK NUMBER REASON FOR CALL**this is important as we prioritize the call backs  YOU WILL RECEIVE A CALL BACK THE SAME DAY AS LONG AS YOU CALL BEFORE 4:00 PM

## 2024-06-08 ENCOUNTER — Ambulatory Visit (HOSPITAL_COMMUNITY): Payer: Self-pay | Admitting: Cardiology

## 2024-06-08 DIAGNOSIS — I5032 Chronic diastolic (congestive) heart failure: Secondary | ICD-10-CM

## 2024-06-08 MED ORDER — REPATHA SURECLICK 140 MG/ML ~~LOC~~ SOAJ: 140.0000 mg | SUBCUTANEOUS | 3 refills | Status: AC

## 2024-06-08 NOTE — Telephone Encounter (Signed)
 Patient called requesting to send prescription for Repatha  to [facility]. Prescription sent. Follow up FLP due in 2-3 months

## 2024-06-08 NOTE — Telephone Encounter (Signed)
-----   Message from Peder Bourdon sent at 06/08/2024  8:02 AM EDT ----- LFTs are elevated, has been on amiodarone .  Amiodarone  stopped today.  Repeat LFTs in 3 wks.  ----- Message ----- From: Dannis Dy, Lab In [place] Sent: 06/07/2024   5:01 PM EDT To: Darlis Eisenmenger, MD

## 2024-06-08 NOTE — Progress Notes (Signed)
 ID:  ELIS SAUBER, DOB 11-10-1936, MRN [id number]  Provider location: [place] [facility] Type of Visit: Established patient  PCP:  [name], MD Cardiologist:  Dr. [name]  Chief complaint: Shortness of breath   History of Present Illness: EZANA HUBBERT is a 88 y.o. male who has history of CAD s/p CABG.  Cath in 11/09 showed that the SVG-distal RCA was patent, the CFX system was patent.  His LIMA was atretic and there were serial 60% and 80% stenoses in the native LAD.  He was managed medically.  Echo in 8/14 showed EF 55-60% with moderate MR and moderate TR.    He was initially noted to have atrial fibrillation in the summer of 2014. He was started on Xarelto  and cardioverted to NSR in 8/14.  Recurrent atrial fibrillation was noted in 1/15, and he was cardioverted to NSR again.  This time, NSR did not hold long. By 5/15, he was in persistent atrial fibrillation.  I referred him to [facility] where he had atrial fibrillation ablation and Tikosyn  loading.  Ranolazine  was stopped due to risk of QT prolongation and Imdur  was started as an anti-anginal instead.  Lexiscan  Cardiolite  in 11/15 showed no ischemia.     CTA chest done in 2/16 to look for evidence for PV stenosis post-AF ablation.  This showed mild short-segment narrowing of the left inferior pulmonary vein.    Patient was admitted in 5/18 with acute on chronic diastolic CHF.  He had been at the beach for [place] days and ate out a number of times, probably getting a significant sodium load.  No chest pain.  He was in normal sinus rhythm.  He was started on IV Lasix  and diuresed.  Echo in 5/18 showed EF 65-70% with moderately dilated RV and normal systolic function, PASP 57 mmHg.  Lexiscan  Cardiolite  in 6/18 showed no significant perfusion defect.    He was admitted with NSTEMI in 3/20, had DES to ostial LAD and mid SVG-RCA.  Echo showed EF 45-50%.  He was in atypical atrial flutter persistently despite Tikosyn .   After his intervention, he was noted to have a radial artery pseudoaneurysm that was repaired by Dr. Charlotte Cookey.   In 6/20, he had a redo atrial fibrillation ablation.  He was also noted to have complex flutter from multiple foci that was not ablated.  After the procedure, he has had considerable pain in the right leg at the cath site but also radiating down the leg, groin US  showed no AV fistula or pseudoaneurysm.    In 10/20, patient held anticoagulation for 2 days for oral surgery and had a CVA presenting as right hand weakness.  He did not get tPA.  Echo in 10/20 showed EF 50-55%, mild LVH, moderately decreased RV function, small PFO. Carotid dopplers showed 1-39% bilateral stenosis. He was switched from Xarelto  to Eliquis .   In 11/20, he went into persistent atrial fibrillation and underwent DCCV.  In 7/21, he went into atypical flutter and had DCCV to NSR.  He had a repeat atrial fibrillation ablation in 11/21.   Echo in 1/22 showed EF 60-65%, normal RV, mild-moderate MR. DCCV to NSR in 1/22.   Given significant weight loss, patient had CT chest/abdomen/pelvis in 7/22, this showed no worrisome abnormalities.   Echo in 8/23 showed EF 55-60%, grade 2 diastolic dysfunction, mild RV dilation and mild RV dysfunction, moderate LAE, mild-moderate MR, PASP 41 mmHg.   Patient had DCCV for atypical  atrial flutter in 10/24 but had return of atypical flutter shortly afterwards.   Tikosyn  was stopped and patient was started on amiodarone .  DCCV was attempted in 5/25, he went back into NSR for about 5 days then atypical flutter recurred.   He returns for followup of CHF, atrial fibrillation, and CAD.  Today he is in atypical atrial flutter with a controlled rate. He feels more weak and unstable on his feet. Appetite is poor.  He is short of breath with stairs or walking longer distances.  He is not taking Lasix .  No chest pain.  He has Repatha  at home but has not started it yet.  Weight down 10 lbs. He has  some lightheadedness with standing.   ECG (personally reviewed): Atypical atrial flutter, nonspecific T wave changes.   Labs (11/24): K 4.5, creatinine 1.21, LDL 77 Labs (1/25): LDL 95, LFTs normal, K 4.9, creatinine 1.2, hgb 16.7 Labs (3/25): BNP 179 Labs (5/25): K 5.1, creatinine 1.36   PMH: 1. CAD: 1st MI in 1988 (while [place] 11,[place] feet on hiking [place] at [employer]).  CABG 1996.  PCI to CFX in 2009.  LHC (11/09) SVG-dRCA patent, total occlusion RCA, patent CFX stent, atretic LIMA, serial 60 and 80% proximal LAD stenoses, EF 60% with basal inferior hypokinesis.  Myoview  in 2011 with no ischemia or infarction.  Echo (10/12) with EF 55-60%, mild LVH, mild MR.  Lexiscan  Cardiolite  in 2013 with no ischemia or infarction.  Echo (8/14) with EF 55-60%, moderate MR, moderate TR, PA systolic pressure 35 mmHg.  Echo (4/15) with EF 60-65%, mild focal basal septal hypertrophy, inferior basal akinesis, mild MR.  Lexiscan  cardiolite  (11/15) with EF 61%, fixed basal inferoseptal defect, no ischemia.  - Lexiscan  Cardiolite  (6/18): EF 54%, no perfusion defect.  - NSTEMI 3/20, cath showed 95% ostial/proximal LAD, 60-70% mid LAD, totally occluded SVG-LCx, totally occluded RCA, 80-90% mid SVG-RCA.  Patient had DES to ostial-mid LAD and DES to SVG-RCA.  2. Raynauds syndrome 3. Post-polio syndrome 4. GERD with dilation of esophageal stricture in 12/12.  5. Hyperlipidemia 6. H/o CVAs: Brainstem stroke with Horner's syndrome.  Recurrent stroke in 10/20 with right hand weakness when off anticoagulation for 2 days.  - Carotid dopplers (10/20) with minimal stenosis.  7. Scoliosis. 8. H/o appendectomy 9. Herpes Zoster 10. Atrial fibrillation/atypical atrial flutter: DCCV to NSR in 8/14. DCCV to NSR in 1/15. Atrial fibrillation ablation 6/15 with Tikosyn  loading (at [facility]).   - Redo atrial fibrillation ablation in 6/20.  Patient also noted to have complex flutter with several foci, not ablated.  - Zio patch  (7/20): Primarily NSR with a few short SVT runs.  - DCCV 11/20.  - DCCV 7/21 - Redo atrial fibrillation ablation 11/21.  - DCCV 1/22 - DCCV 10/24 - DCCV 5/25 (on amiodarone ) 11. PFTs (4/15) with FVC 59%, FEV1 54%, ratio 91%, DLCO 53% => moderate obstructive defect thought to be related to COPD and severe restrictive defect thought to be due to elevated left hemidiaphragm and post-polio syndrome.  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  Echo (5/18) with EF 65-70%, RV moderately dilated with normal systolic function, PASP 57 mmHg.  - Echo (3/20): EF 45-50%, mild LV dilation, basal inferolateral and inferior hypokinesis.  - Echo (10/20): EF 50-55%, mild LVH, basal inferior and inferoseptal hypokinesis, moderately decreased RV systolic function, small PFO.  - Echo (1/22): EF 60-65%, normal RV, mild-moderate MR.  - Echo (8/23): EF 55-60%, grade 2 diastolic dysfunction, mild RV dilation and mild RV  dysfunction, moderate LAE, mild-moderate MR, PASP 41 mmHg.  13. CKD: Stage 3.  14. OSA: dental appliance.  15. Right radial artery pseudoaneurysm: post-cath in 3/20, s/p repair.  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 H/o esophageal stricture  Current Outpatient Medications  Medication Sig Dispense Refill   acetaminophen  (TYLENOL ) 500 MG tablet Take 250-500 mg by mouth as needed for mild pain (pain score 1-3) or moderate pain (pain score 4-6).     albuterol  (PROAIR  HFA) 108 (90 BASE) MCG/ACT inhaler Inhale 2 puffs into the lungs every 4 (four) hours as needed for wheezing or shortness of breath (and prior to exercise). 1 Inhaler 3   Cholecalciferol (VITAMIN D3) 50 MCG (2000 UT) CAPS Take 2,000 Units by mouth every evening.     dicyclomine  (BENTYL ) 10 MG capsule TAKE ONE CAPSULE BY MOUTH 3 TIMES DAILY AS NEEDED FOR SPASMS 270 capsule 3   diltiazem  (CARDIZEM  CD) 120 MG 24 hr capsule Take 1 capsule (120 mg total) by mouth daily. Hold if systolic blood pressure (top number) less than 100 mmHg or pulse less than 55 bpm.      ELIQUIS  2.5 MG TABS tablet TAKE 1 TABLET BY MOUTH TWICE A DAY 180 tablet 3   ezetimibe  (ZETIA ) 10 MG tablet TAKE 1 TABLET BY MOUTH EVERY DAY 90 tablet 3   famotidine  (PEPCID ) 40 MG tablet TAKE 1 TABLET BY MOUTH EVERYDAY AT BEDTIME 90 tablet 3   furosemide  (LASIX ) 20 MG tablet Take 1 tablet (20 mg total) by mouth 2 (two) times a week. 30 tablet 4   JARDIANCE  10 MG TABS tablet TAKE 1 TABLET BY MOUTH EVERY DAY BEFORE BREAKFAST 90 tablet 3   loperamide (IMODIUM) 2 MG capsule Take 2 mg by mouth daily. My take a second 2 mg dose during the day as needed for loose stools     LORazepam  (ATIVAN ) 0.5 MG tablet Take 0.5 mg by mouth at bedtime.     methocarbamol  (ROBAXIN ) 500 MG tablet Take 500 mg by mouth at bedtime. May take a 500 mg dose twice daily as needed for muscle spasms     metoprolol  succinate (TOPROL  XL) 25 MG 24 hr tablet Take 1 tablet (25 mg total) by mouth daily. 30 tablet 6   nitroGLYCERIN  (NITROSTAT ) 0.4 MG SL tablet Place 1 tablet (0.4 mg total) under the tongue every 5 (five) minutes as needed for chest pain. 25 tablet 0   Probiotic Product (ALIGN) 4 MG CAPS Take 4 mg by mouth every evening.     rosuvastatin  (CRESTOR ) 40 MG tablet TAKE 1 TABLET BY MOUTH EVERY DAY 90 tablet 3   tadalafil (CIALIS) 10 MG tablet Take 10 mg by mouth daily as needed (cold hands and feet).     TESTOSTERONE  TD Place 1 Application onto the skin daily. 100 mg dose     traZODone  (DESYREL ) 50 MG tablet Take 50 mg by mouth at bedtime.     Evolocumab  (REPATHA  SURECLICK) 140 MG/ML SOAJ Inject 140 mg into the skin every 14 (fourteen) days. 6 mL 3   No current facility-administered medications for this encounter.    Allergies:   Morphine  and codeine and Tape   Social History:  The patient  reports that he quit smoking about 57 years ago. His smoking use included cigarettes. He started smoking about 62 years ago. He has a 2.5 pack-year smoking history. He quit smokeless tobacco use about 75 years ago. He reports that  he does not drink alcohol  and does not use drugs.  Family History:  The patient's family history includes Cancer in his son; Heart Problems in his brother; Heart disease in his father, maternal grandfather, maternal grandmother, mother, paternal grandfather, and paternal grandmother; Prostate cancer in his paternal uncle.   ROS:  Please see the history of present illness.   All other systems are personally reviewed and negative.   Exam:   BP 122/64   Pulse 82   Ht 5' 6 (1.676 m)   Wt 49.7 kg (109 lb 9.6 oz)   SpO2 92%   BMI 17.69 kg/m  General: NAD, thin Neck: No JVD, no thyromegaly or thyroid  nodule.  Lungs: Decreased BS left base.  CV: Nondisplaced PMI.  Heart regular S1/S2, no S3/S4, no murmur.  No peripheral edema.  No carotid bruit.  Normal pedal pulses.  Abdomen: Soft, nontender, no hepatosplenomegaly, no distention.  Skin: Intact without lesions or rashes.  Neurologic: Alert and oriented x 3.  Psych: Normal affect. Extremities: No clubbing or cyanosis.  HEENT: Normal.   Recent Labs: 11/09/2023: Magnesium  2.3 05/16/2024: Hemoglobin 16.1; Platelets 156 06/07/2024: ALT 81; B Natriuretic Peptide 164.1; BUN 19; Creatinine, Ser 1.34; Potassium 4.5; Sodium 135; TSH 2.774  Personally reviewed   Wt Readings from Last 3 Encounters:  06/07/24 49.7 kg (109 lb 9.6 oz)  05/22/24 48.5 kg (107 lb)  05/16/24 49.9 kg (110 lb)   ASSESSMENT AND PLAN:  1. Atrial fibrillation/flutter: He is s/p atrial fibrillation ablation at [facility] on 06/25/14.  He has also had atypical atrial flutter. He had a redo atrial fibrillation ablation in 6/20.  Complex flutter was noted from several foci and was not ablated.  Zio patch from 7/20 showed predominantly NSR with short runs SVT.  DCCV in 11/20, DCCV again in 7/21 and in 1/22. Redo atrial fibrillation ablation in 11/21.  He has significant sinus node disease/sick sinus syndrome and may eventually require pacemaker.  He failed Tikosyn  and amiodarone  was  started.  He had DCCV to NSR on amiodarone  in 5/25 but atypical flutter recurred after about 5 days and he is back in flutter today.  Rate is controlled.  - Amiodarone  does not appear to be working to keep him in rhythm, and he worries that his poor appetite and unsteadiness may be side effects of amiodarone  (this is certainly possible).  I will stop amiodarone  and we will focus on rate control.  I will start Toprol  XL 25 mg daily after stopping amiodarone .  Check LFTs and TSH today.  - Continue apixaban , this is appropriately dosed at 2.5 mg bid with age and low weight.   - Can continue current diltiazem  CD.  2. CAD: H/o CABG.  He had NSTEMI in 3/20 with DES to ostial/proximal LAD and SVG-RCA.  No chest pain.  - He will continue apixaban  long-term for atrial fibrillation/flutter.  - He is on Crestor  40 mg daily and Zetia  10 mg daily.   3. Hyperlipidemia: He is taking Crestor  40 mg daily and Zetia  10 mg daily.  LDL has been above goal < 55.  - He has Repatha  at home but has not started it yet.  I encouraged him to start Repatha .  4. Ischemic cardiomyopathy/primarily diastolic CHF: Echo in 3/20 with EF 45-50%, echo in 10/20 with EF 50-55%, echo in 1/22 with EF 60-65%.  Echo in 8/23 showed EF 55-60%, grade 2 diastolic dysfunction, mild RV dilation and mild RV dysfunction, moderate LAE, mild-moderate MR, PASP 41 mmHg.  NYHA class III.  Weight is down and he is not  volume overloaded.  - He has not needed Lasix .        - Continue Jardiance  10 mg daily.  BMET/BNP today.  - He is off losartan  with elevated creatinine.  - I am going to arrange for repeat echo.  5. Pulmonary: PFTs suggested both a restrictive and obstructive defect.  The restrictive defect is likely from post-polio syndrome and elevated left hemidiaphragm.  Mr Soy never smoked much, so the obstructive defect is more surprising.  6. OSA: Severe, likely potentiates his atrial fibrillation. Unable to tolerate CPAP or oral appliance.   7. CVA:  10/20, in setting of holding anticoagulation for 2 days.  Avoid holding anticoagulation in the future.  Mild residual effect on right hand dexterity.   Followup in 2 months with APP.    I spent 32 minutes reviewing data, interviewing patient, and organizing the orders/followup.   Signed, Peder Bourdon, MD  06/08/2024   [facility] [address] [facility] [place] [address] [phone] (office) [phone] (fax)

## 2024-06-08 NOTE — Telephone Encounter (Signed)
 Spoke with patient regarding the following results. Patient made aware and patient verbalized understanding.   Patient scheduled for repeat labs. Patient aware of appointment time and date.   Patient requested labs be faxed over to the number [phone]. This has been completed  Advised patient to call back to office with any issues, questions, or concerns. Patient verbalized understanding.

## 2024-06-08 NOTE — Addendum Note (Signed)
 Addended by: Mical Kicklighter K on: 06/08/2024 12:55 PM   Modules accepted: Orders

## 2024-06-12 DIAGNOSIS — L821 Other seborrheic keratosis: Secondary | ICD-10-CM | POA: Diagnosis not present

## 2024-06-12 DIAGNOSIS — L812 Freckles: Secondary | ICD-10-CM | POA: Diagnosis not present

## 2024-06-12 DIAGNOSIS — L853 Xerosis cutis: Secondary | ICD-10-CM | POA: Diagnosis not present

## 2024-06-12 DIAGNOSIS — D1801 Hemangioma of skin and subcutaneous tissue: Secondary | ICD-10-CM | POA: Diagnosis not present

## 2024-06-12 DIAGNOSIS — D2271 Melanocytic nevi of right lower limb, including hip: Secondary | ICD-10-CM | POA: Diagnosis not present

## 2024-06-12 DIAGNOSIS — Z85828 Personal history of other malignant neoplasm of skin: Secondary | ICD-10-CM | POA: Diagnosis not present

## 2024-06-13 ENCOUNTER — Telehealth (HOSPITAL_COMMUNITY): Payer: Self-pay | Admitting: Cardiology

## 2024-06-13 NOTE — Telephone Encounter (Signed)
 Patient called to request fax of lab results to [facility]   As requested results sent via Epic routing

## 2024-06-18 ENCOUNTER — Other Ambulatory Visit (HOSPITAL_COMMUNITY): Payer: Self-pay | Admitting: Cardiology

## 2024-06-25 DIAGNOSIS — H18413 Arcus senilis, bilateral: Secondary | ICD-10-CM | POA: Diagnosis not present

## 2024-06-25 DIAGNOSIS — Z961 Presence of intraocular lens: Secondary | ICD-10-CM | POA: Diagnosis not present

## 2024-06-25 DIAGNOSIS — H02883 Meibomian gland dysfunction of right eye, unspecified eyelid: Secondary | ICD-10-CM | POA: Diagnosis not present

## 2024-06-27 ENCOUNTER — Ambulatory Visit (HOSPITAL_COMMUNITY): Payer: Self-pay | Admitting: Cardiology

## 2024-06-27 ENCOUNTER — Ambulatory Visit (HOSPITAL_COMMUNITY)
Admission: RE | Admit: 2024-06-27 | Discharge: 2024-06-27 | Disposition: A | Discharge status: Home / Self Care | Source: Ambulatory Visit | Attending: Internal Medicine | Admitting: Internal Medicine

## 2024-06-27 ENCOUNTER — Ambulatory Visit (HOSPITAL_COMMUNITY)
Admission: RE | Admit: 2024-06-27 | Discharge: 2024-06-27 | Disposition: A | Discharge status: Home / Self Care | Source: Ambulatory Visit | Attending: Internal Medicine

## 2024-06-27 DIAGNOSIS — I5032 Chronic diastolic (congestive) heart failure: Secondary | ICD-10-CM | POA: Insufficient documentation

## 2024-06-27 DIAGNOSIS — R7989 Other specified abnormal findings of blood chemistry: Secondary | ICD-10-CM

## 2024-06-27 DIAGNOSIS — I081 Rheumatic disorders of both mitral and tricuspid valves: Secondary | ICD-10-CM | POA: Insufficient documentation

## 2024-06-27 DIAGNOSIS — I493 Ventricular premature depolarization: Secondary | ICD-10-CM | POA: Diagnosis not present

## 2024-06-27 DIAGNOSIS — I480 Paroxysmal atrial fibrillation: Secondary | ICD-10-CM | POA: Insufficient documentation

## 2024-06-27 LAB — HEPATIC FUNCTION PANEL
ALT: 106 U/L — ABNORMAL HIGH (ref 0–44)
AST: 77 U/L — ABNORMAL HIGH (ref 15–41)
Albumin: 3.9 g/dL (ref 3.5–5.0)
Alkaline Phosphatase: 130 U/L — ABNORMAL HIGH (ref 38–126)
Bilirubin, Direct: 0.3 mg/dL — ABNORMAL HIGH (ref 0.0–0.2)
Indirect Bilirubin: 0.7 mg/dL (ref 0.3–0.9)
Total Bilirubin: 1 mg/dL (ref 0.0–1.2)
Total Protein: 6.9 g/dL (ref 6.5–8.1)

## 2024-06-27 LAB — ECHOCARDIOGRAM COMPLETE
Area-P 1/2: 4.46 cm2
Calc EF: 43.8 %
S' Lateral: 2.8 cm
Single Plane A2C EF: 52.6 %
Single Plane A4C EF: 36.4 %

## 2024-06-28 NOTE — Telephone Encounter (Signed)
 Patient advised and verbalized understanding,lab appointment scheduled,orders entered.  Orders Placed This Encounter  Procedures   US  ABDOMEN LIMITED RUQ (LIVER/GB)    Standing Status:   Future    Expiration Date:   06/28/2025    Reason for Exam (SYMPTOM  OR DIAGNOSIS REQUIRED):   elevated lft    Preferred imaging location?:   [place]    Release to patient:   Immediate   Hepatic function panel    Standing Status:   Future    Expected Date:   07/10/2024    Expiration Date:   06/28/2025    Release to patient:   Immediate [1]   Hepatitis B e antibody    Standing Status:   Future    Expected Date:   07/10/2024    Expiration Date:   06/28/2025    Release to patient:   Immediate [1]   Hepatitis C Antibody    Standing Status:   Future    Expected Date:   07/10/2024    Expiration Date:   06/28/2025    Release to patient:   Immediate [1]   Hepatitis A antibody, IgM    Standing Status:   Future    Expected Date:   07/10/2024    Expiration Date:   06/28/2025    Release to patient:   Immediate [1]

## 2024-06-28 NOTE — Telephone Encounter (Signed)
-----   Message from Ezra Shuck sent at 06/27/2024  5:35 PM EDT ----- LFTs still elevated.  I do not see any new meds that would trigger this. Make sure he is no longer taking amiodarone .  Would get RUQ ultrasound to assess liver and gallbladder.  Send viral hepatitis  panel (HBV, HCV, HAV).  Repeat LFTs 10 days.  ----- Message ----- From: Rebecka, Lab In [place] Sent: 06/27/2024  11:57 AM EDT To: Ezra GORMAN Shuck, MD

## 2024-07-03 NOTE — Telephone Encounter (Signed)
 Abd u/s sch for Wed 7/9 at 7 am, pt and wife aware to arrive at 6:45, NPO after midnight.   Wife also reports pt's last dose of Amiodarone  was on 06/06/24.

## 2024-07-04 ENCOUNTER — Ambulatory Visit (HOSPITAL_COMMUNITY)
Admission: RE | Admit: 2024-07-04 | Discharge: 2024-07-04 | Disposition: A | Discharge status: Home / Self Care | Source: Ambulatory Visit | Attending: Cardiology | Admitting: Cardiology

## 2024-07-04 ENCOUNTER — Ambulatory Visit (HOSPITAL_COMMUNITY): Payer: Self-pay | Admitting: Cardiology

## 2024-07-04 DIAGNOSIS — R7989 Other specified abnormal findings of blood chemistry: Secondary | ICD-10-CM | POA: Insufficient documentation

## 2024-07-04 DIAGNOSIS — N1832 Chronic kidney disease, stage 3b: Secondary | ICD-10-CM | POA: Diagnosis not present

## 2024-07-04 DIAGNOSIS — R634 Abnormal weight loss: Secondary | ICD-10-CM | POA: Diagnosis not present

## 2024-07-04 DIAGNOSIS — D6869 Other thrombophilia: Secondary | ICD-10-CM | POA: Diagnosis not present

## 2024-07-04 DIAGNOSIS — I480 Paroxysmal atrial fibrillation: Secondary | ICD-10-CM | POA: Diagnosis not present

## 2024-07-04 DIAGNOSIS — R2681 Unsteadiness on feet: Secondary | ICD-10-CM | POA: Diagnosis not present

## 2024-07-04 DIAGNOSIS — K760 Fatty (change of) liver, not elsewhere classified: Secondary | ICD-10-CM | POA: Diagnosis not present

## 2024-07-04 DIAGNOSIS — I130 Hypertensive heart and chronic kidney disease with heart failure and stage 1 through stage 4 chronic kidney disease, or unspecified chronic kidney disease: Secondary | ICD-10-CM | POA: Diagnosis not present

## 2024-07-04 DIAGNOSIS — M62838 Other muscle spasm: Secondary | ICD-10-CM | POA: Diagnosis not present

## 2024-07-04 DIAGNOSIS — I509 Heart failure, unspecified: Secondary | ICD-10-CM | POA: Diagnosis not present

## 2024-07-04 DIAGNOSIS — Z7901 Long term (current) use of anticoagulants: Secondary | ICD-10-CM | POA: Diagnosis not present

## 2024-07-06 NOTE — Telephone Encounter (Signed)
 Called patient per Dr. Rolan with following abdominal ultrasound results:   Suspect fatty liver, not cirrhotic. Gallbladder normal.   Pt and wife verbalized understanding of same.

## 2024-07-10 ENCOUNTER — Ambulatory Visit (HOSPITAL_COMMUNITY)
Admission: RE | Admit: 2024-07-10 | Discharge: 2024-07-10 | Disposition: A | Discharge status: Home / Self Care | Source: Ambulatory Visit | Attending: Cardiology | Admitting: Cardiology

## 2024-07-10 DIAGNOSIS — R7989 Other specified abnormal findings of blood chemistry: Secondary | ICD-10-CM | POA: Diagnosis not present

## 2024-07-10 LAB — HEPATITIS A ANTIBODY, IGM: Hep A IgM: NONREACTIVE

## 2024-07-10 LAB — HEPATIC FUNCTION PANEL
ALT: 113 U/L — ABNORMAL HIGH (ref 0–44)
AST: 92 U/L — ABNORMAL HIGH (ref 15–41)
Albumin: 3.6 g/dL (ref 3.5–5.0)
Alkaline Phosphatase: 154 U/L — ABNORMAL HIGH (ref 38–126)
Bilirubin, Direct: 0.3 mg/dL — ABNORMAL HIGH (ref 0.0–0.2)
Indirect Bilirubin: 0.7 mg/dL (ref 0.3–0.9)
Total Bilirubin: 1 mg/dL (ref 0.0–1.2)
Total Protein: 6.7 g/dL (ref 6.5–8.1)

## 2024-07-10 LAB — HEPATITIS C ANTIBODY: HCV Ab: NONREACTIVE

## 2024-07-11 LAB — HEPATITIS B E ANTIBODY: Hep B E Ab: NONREACTIVE

## 2024-07-11 NOTE — Addendum Note (Signed)
 Addended by: TITA AGE B on: 07/11/2024 04:08 PM   Modules accepted: Orders

## 2024-07-16 DIAGNOSIS — Z7901 Long term (current) use of anticoagulants: Secondary | ICD-10-CM | POA: Diagnosis not present

## 2024-07-16 DIAGNOSIS — N1832 Chronic kidney disease, stage 3b: Secondary | ICD-10-CM | POA: Diagnosis not present

## 2024-07-16 DIAGNOSIS — J449 Chronic obstructive pulmonary disease, unspecified: Secondary | ICD-10-CM | POA: Diagnosis not present

## 2024-07-16 DIAGNOSIS — I509 Heart failure, unspecified: Secondary | ICD-10-CM | POA: Diagnosis not present

## 2024-07-16 DIAGNOSIS — E785 Hyperlipidemia, unspecified: Secondary | ICD-10-CM | POA: Diagnosis not present

## 2024-07-16 DIAGNOSIS — R634 Abnormal weight loss: Secondary | ICD-10-CM | POA: Diagnosis not present

## 2024-07-16 DIAGNOSIS — I130 Hypertensive heart and chronic kidney disease with heart failure and stage 1 through stage 4 chronic kidney disease, or unspecified chronic kidney disease: Secondary | ICD-10-CM | POA: Diagnosis not present

## 2024-07-16 DIAGNOSIS — D6869 Other thrombophilia: Secondary | ICD-10-CM | POA: Diagnosis not present

## 2024-07-16 DIAGNOSIS — R2681 Unsteadiness on feet: Secondary | ICD-10-CM | POA: Diagnosis not present

## 2024-07-16 DIAGNOSIS — K760 Fatty (change of) liver, not elsewhere classified: Secondary | ICD-10-CM | POA: Diagnosis not present

## 2024-07-16 DIAGNOSIS — M1990 Unspecified osteoarthritis, unspecified site: Secondary | ICD-10-CM | POA: Diagnosis not present

## 2024-07-16 DIAGNOSIS — I480 Paroxysmal atrial fibrillation: Secondary | ICD-10-CM | POA: Diagnosis not present

## 2024-08-03 NOTE — Progress Notes (Addendum)
 [facility]   ID:  [name], DOB 1936-09-10, MRN [id number]  Provider location: [place] [facility] Type of Visit: Established patient  PCP:  Jimmy Ade, MD Cardiologist:  Dr. Rolan   HPI: [name] is a 88 y.o. male who has history of CAD s/p CABG.  Cath in 11/09 showed that the SVG-distal RCA was patent, the CFX system was patent.  His LIMA was atretic and there were serial 60% and 80% stenoses in the native LAD.  He was managed medically.  Echo in 8/14 showed EF 55-60% with moderate MR and moderate TR.    He was initially noted to have atrial fibrillation in the summer of 2014. He was started on Xarelto  and cardioverted to NSR in 8/14.  Recurrent atrial fibrillation was noted in 1/15, and he was cardioverted to NSR again.  This time, NSR did not hold long. By 5/15, he was in persistent atrial fibrillation.  I referred him to [facility] where he had atrial fibrillation ablation and Tikosyn  loading.  Ranolazine  was stopped due to risk of QT prolongation and Imdur  was started as an anti-anginal instead.  Lexiscan  Cardiolite  in 11/15 showed no ischemia.     CTA chest done in 2/16 to look for evidence for PV stenosis post-AF ablation.  This showed mild short-segment narrowing of the left inferior pulmonary vein.    Patient was admitted in 5/18 with acute on chronic diastolic CHF.  He had been at the beach for [place] days and ate out a number of times, probably getting a significant sodium load.  No chest pain.  He was in normal sinus rhythm.  He was started on IV Lasix  and diuresed.  Echo in 5/18 showed EF 65-70% with moderately dilated RV and normal systolic function, PASP 57 mmHg.  Lexiscan  Cardiolite  in 6/18 showed no significant perfusion defect.    He was admitted with NSTEMI in 3/20, had DES to ostial LAD and mid SVG-RCA.  Echo showed EF 45-50%.  He was in atypical atrial flutter persistently despite Tikosyn .  After his intervention, he was noted  to have a radial artery pseudoaneurysm that was repaired by Dr. Serene.   In 6/20, he had a redo atrial fibrillation ablation.  He was also noted to have complex flutter from multiple foci that was not ablated.  After the procedure, he has had considerable pain in the right leg at the cath site but also radiating down the leg, groin US  showed no AV fistula or pseudoaneurysm.    In 10/20, patient held anticoagulation for 2 days for oral surgery and had a CVA presenting as right hand weakness.  He did not get tPA.  Echo in 10/20 showed EF 50-55%, mild LVH, moderately decreased RV function, small PFO. Carotid dopplers showed 1-39% bilateral stenosis. He was switched from Xarelto  to Eliquis .   In 11/20, he went into persistent atrial fibrillation and underwent DCCV.  In 7/21, he went into atypical flutter and had DCCV to NSR.  He had a repeat atrial fibrillation ablation in 11/21.   Echo in 1/22 showed EF 60-65%, normal RV, mild-moderate MR. DCCV to NSR in 1/22.   Given significant weight loss, patient had CT chest/abdomen/pelvis in 7/22, this showed no worrisome abnormalities.   Echo in 8/23 showed EF 55-60%, grade 2 diastolic dysfunction, mild RV dilation and mild RV dysfunction, moderate LAE, mild-moderate MR, PASP 41 mmHg.   Patient had DCCV for atypical atrial flutter in 10/24 but  had return of atypical flutter shortly afterwards.   Tikosyn  was stopped and patient was started on amiodarone .  DCCV was attempted in 5/25, he went back into NSR for about 5 days then atypical flutter recurred.   Echo 7/25 EF 55-60%, low normal RV function.   Today he returns for HF follow up with his wife. Overall feeling fine. Uses albuterol  occasionally. He is not SOB walking up steps. Remains dizzy. Denies palpitations, abnormal bleeding, CP,  edema, or PND/Orthopnea. Appetite ok. Weight at home 105 pounds. Taking all medications. BP 100-120s/60s. In and out of AF, but does not feel palpitations. Wife says she  knows this by home BP monitor.  Wife states he does not eat or drink enough. Patient states he eats no salt.  ECG (personally reviewed): none ordered today.  Labs (11/24): K 4.5, creatinine 1.21, LDL 77 Labs (1/25): LDL 95, LFTs normal, K 4.9, creatinine 1.2, hgb 16.7 Labs (3/25): BNP 179 Labs (5/25): K 5.1, creatinine 1.36 Labs (6/25): K 4.5, creatinine 1.34   PMH: 1. CAD: 1st MI in 1988 (while [place] 11,[place] feet on hiking [place] at [employer]).  CABG 1996.  PCI to CFX in 2009.  LHC (11/09) SVG-dRCA patent, total occlusion RCA, patent CFX stent, atretic LIMA, serial 60 and 80% proximal LAD stenoses, EF 60% with basal inferior hypokinesis.  Myoview  in 2011 with no ischemia or infarction.  Echo (10/12) with EF 55-60%, mild LVH, mild MR.  Lexiscan  Cardiolite  in 2013 with no ischemia or infarction.  Echo (8/14) with EF 55-60%, moderate MR, moderate TR, PA systolic pressure 35 mmHg.  Echo (4/15) with EF 60-65%, mild focal basal septal hypertrophy, inferior basal akinesis, mild MR.  Lexiscan  cardiolite  (11/15) with EF 61%, fixed basal inferoseptal defect, no ischemia.  - Lexiscan  Cardiolite  (6/18): EF 54%, no perfusion defect.  - NSTEMI 3/20, cath showed 95% ostial/proximal LAD, 60-70% mid LAD, totally occluded SVG-LCx, totally occluded RCA, 80-90% mid SVG-RCA.  Patient had DES to ostial-mid LAD and DES to SVG-RCA.  2. Raynauds syndrome 3. Post-polio syndrome 4. GERD with dilation of esophageal stricture in 12/12.  5. Hyperlipidemia 6. H/o CVAs: Brainstem stroke with Horner's syndrome.  Recurrent stroke in 10/20 with right hand weakness when off anticoagulation for 2 days.  - Carotid dopplers (10/20) with minimal stenosis.  7. Scoliosis. 8. H/o appendectomy 9. Herpes Zoster 10. Atrial fibrillation/atypical atrial flutter: DCCV to NSR in 8/14. DCCV to NSR in 1/15. Atrial fibrillation ablation 6/15 with Tikosyn  loading (at [facility]).   - Redo atrial fibrillation ablation in 6/20.  Patient also noted  to have complex flutter with several foci, not ablated.  - Zio patch (7/20): Primarily NSR with a few short SVT runs.  - DCCV 11/20.  - DCCV 7/21 - Redo atrial fibrillation ablation 11/21.  - DCCV 1/22 - DCCV 10/24 - DCCV 5/25 (on amiodarone ) 11. PFTs (4/15) with FVC 59%, FEV1 54%, ratio 91%, DLCO 53% => moderate obstructive defect thought to be related to COPD and severe restrictive defect thought to be due to elevated left hemidiaphragm and post-polio syndrome.  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  Echo (5/18) with EF 65-70%, RV moderately dilated with normal systolic function, PASP 57 mmHg.  - Echo (3/20): EF 45-50%, mild LV dilation, basal inferolateral and inferior hypokinesis.  - Echo (10/20): EF 50-55%, mild LVH, basal inferior and inferoseptal hypokinesis, moderately decreased RV systolic function, small PFO.  - Echo (1/22): EF 60-65%, normal RV, mild-moderate MR.  - Echo (8/23): EF 55-60%, grade 2 diastolic  dysfunction, mild RV dilation and mild RV dysfunction, moderate LAE, mild-moderate MR, PASP 41 mmHg.  - Echo (7/25): EF 55-60%, low normal RV function.  13. CKD: Stage 3.  14. OSA: dental appliance.  15. Right radial artery pseudoaneurysm: post-cath in 3/20, s/p repair.  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 H/o esophageal stricture  Current Outpatient Medications  Medication Sig Dispense Refill   acetaminophen  (TYLENOL ) 500 MG tablet Take 250-500 mg by mouth as needed for mild pain (pain score 1-3) or moderate pain (pain score 4-6).     albuterol  (PROAIR  HFA) 108 (90 BASE) MCG/ACT inhaler Inhale 2 puffs into the lungs every 4 (four) hours as needed for wheezing or shortness of breath (and prior to exercise). 1 Inhaler 3   dicyclomine  (BENTYL ) 10 MG capsule TAKE ONE CAPSULE BY MOUTH 3 TIMES DAILY AS NEEDED FOR SPASMS 270 capsule 3   diltiazem  (CARDIZEM  CD) 120 MG 24 hr capsule Take 1 capsule (120 mg total) by mouth daily. Hold if systolic blood pressure (top number) less than 100 mmHg or  pulse less than 55 bpm.     ELIQUIS  2.5 MG TABS tablet TAKE 1 TABLET BY MOUTH TWICE A DAY 180 tablet 3   Evolocumab  (REPATHA  SURECLICK) 140 MG/ML SOAJ Inject 140 mg into the skin every 14 (fourteen) days. 6 mL 3   ezetimibe  (ZETIA ) 10 MG tablet TAKE 1 TABLET BY MOUTH EVERY DAY 90 tablet 3   famotidine  (PEPCID ) 40 MG tablet TAKE 1 TABLET BY MOUTH EVERYDAY AT BEDTIME 90 tablet 3   loperamide (IMODIUM) 2 MG capsule Take 2 mg by mouth daily. My take a second 2 mg dose during the day as needed for loose stools     LORazepam  (ATIVAN ) 0.5 MG tablet Take 0.5 mg by mouth at bedtime.     methocarbamol  (ROBAXIN ) 500 MG tablet Take 500 mg by mouth at bedtime. May take a 500 mg dose twice daily as needed for muscle spasms     metoprolol  succinate (TOPROL  XL) 25 MG 24 hr tablet Take 1 tablet (25 mg total) by mouth daily. 30 tablet 6   nitroGLYCERIN  (NITROSTAT ) 0.4 MG SL tablet Place 1 tablet (0.4 mg total) under the tongue every 5 (five) minutes as needed for chest pain. 25 tablet 0   Probiotic Product (ALIGN) 4 MG CAPS Take 4 mg by mouth every evening.     rosuvastatin  (CRESTOR ) 40 MG tablet TAKE 1 TABLET BY MOUTH EVERY DAY 90 tablet 3   tadalafil (CIALIS) 10 MG tablet Take 10 mg by mouth daily as needed (cold hands and feet).     TESTOSTERONE  TD Place 1 Application onto the skin daily. 100 mg dose     traZODone  (DESYREL ) 50 MG tablet Take 50 mg by mouth at bedtime.     Cholecalciferol (VITAMIN D3) 50 MCG (2000 UT) CAPS Take 2,000 Units by mouth every evening. (Patient not taking: Reported on 08/08/2024)     empagliflozin  (JARDIANCE ) 10 MG TABS tablet Take Jardiance  5 mg( 1/2 tablet) daily 90 tablet 3   furosemide  (LASIX ) 20 MG tablet Take 1 tablet (20 mg total) by mouth 2 (two) times a week. (Patient not taking: Reported on 08/08/2024) 30 tablet 4   No current facility-administered medications for this encounter.   Allergies:   Morphine  and codeine and Tape   Social History:  The patient  reports that he  quit smoking about 57 years ago. His smoking use included cigarettes. He started smoking about 62 years ago. He has a  2.5 pack-year smoking history. He quit smokeless tobacco use about 75 years ago. He reports that he does not drink alcohol  and does not use drugs.   Family History:  The patient's family history includes Cancer in his son; Heart Problems in his brother; Heart disease in his father, maternal grandfather, maternal grandmother, mother, paternal grandfather, and paternal grandmother; Prostate cancer in his paternal uncle.   ROS:  Please see the history of present illness.   All other systems are personally reviewed and negative.   Wt Readings from Last 3 Encounters:  08/08/24 49.1 kg (108 lb 3.2 oz)  06/07/24 49.7 kg (109 lb 9.6 oz)  05/22/24 48.5 kg (107 lb)   BP 104/60 (BP Location: Left Arm)   Pulse 70   Ht 5' 6 (1.676 m)   Wt 49.1 kg (108 lb 3.2 oz)   SpO2 97%   BMI 17.46 kg/m   Orthostatics today 08/08/24- Lying: 120/60, 70 Sitting: 120/60, 70 Standing: 104/60, 70  Physical Exam:   General:  NAD. No resp difficulty, walked into clinic, elderly, thin HEENT: Normal Neck: Supple. No JVD. Cor: Irregular rate & rhythm. No rubs, gallops or murmurs. Lungs: Clear Abdomen: Soft, nontender, nondistended.  Extremities: No cyanosis, clubbing, rash, edema Neuro: Alert & oriented x 3, moves all 4 extremities w/o difficulty. Affect pleasant.  ASSESSMENT AND PLAN: 1. Atrial fibrillation/flutter: He is s/p atrial fibrillation ablation at [facility] on 06/25/14.  He has also had atypical atrial flutter. He had a redo atrial fibrillation ablation in 6/20.  Complex flutter was noted from several foci and was not ablated.  Zio patch from 7/20 showed predominantly NSR with short runs SVT.  DCCV in 11/20, DCCV again in 7/21 and in 1/22. Redo atrial fibrillation ablation in 11/21.  He has significant sinus node disease/sick sinus syndrome and may eventually require pacemaker.  He failed Tikosyn   and amiodarone  was started.  He had DCCV to NSR on amiodarone  in 5/25 but atypical flutter recurred after about 5 days and he is remains in AFL. Rate is controlled.  - Amiodarone  does not appear to be working to keep him in rhythm. We stopped amiodarone  7/25 and we will focus on rate control.  - Continue Toprol  XL 25 mg daily.    - Continue apixaban , this is appropriately dosed at 2.5 mg bid with age and low weight.   - Can continue current diltiazem  CD.  2. CAD: H/o CABG.  He had NSTEMI in 3/20 with DES to ostial/proximal LAD and SVG-RCA.  No chest pain.  - He will continue apixaban  long-term for atrial fibrillation/flutter. Recent CBC stable - He is on Crestor  40 mg daily and Zetia  10 mg daily.   3. Hyperlipidemia: He is taking Crestor  40 mg daily and Zetia  10 mg daily.  LDL has been above goal < 55.  - He is now on Repatha .  4. Ischemic cardiomyopathy/primarily diastolic CHF: Echo in 3/20 with EF 45-50%, echo in 10/20 with EF 50-55%, echo in 1/22 with EF 60-65%.  Echo in 8/23 showed EF 55-60%, grade 2 diastolic dysfunction, mild RV dilation and mild RV dysfunction, moderate LAE, mild-moderate MR, PASP 41 mmHg.  Echo 7/25 showed EF 55-60%, with low/normal RV function. NYHA class III, orthostasis is main limiter.  Weight is down and he is not volume overloaded.  - With + orthostatics in clinic today and concern for volume depletion, drop Jardiance  to 5 mg daily. May need to stop altogether. - Place compression hose.  - He has not  needed Lasix .        - He is off losartan  with elevated creatinine.  5. Pulmonary: PFTs suggested both a restrictive and obstructive defect.  The restrictive defect is likely from post-polio syndrome and elevated left hemidiaphragm.  Mr Neuser never smoked much, so the obstructive defect is more surprising.  6. OSA: Severe, likely potentiates his atrial fibrillation. Unable to tolerate CPAP or oral appliance.   - May contribute to his fatigue. 7. CVA: 10/20, in setting of  holding anticoagulation for 2 days.  Avoid holding anticoagulation in the future.  Mild residual effect on right hand dexterity.  8. Elevated LFTs: Amiodarone  stopped 06/08/24. LFTs have trended up. RUQ Abdominal U/S showed sus[ected fatty liver, not cirrhotic.  - He has been referred to GI. Will check on this referral today as he has not heard from office.  Follow up in 4 months with Dr. Rolan  Signed, Harlene CHRISTELLA Gainer, FNP  08/08/2024   [facility] [address] [facility] [place] [address] [phone] (office) [phone] (fax)

## 2024-08-07 ENCOUNTER — Telehealth (HOSPITAL_COMMUNITY): Payer: Self-pay

## 2024-08-07 NOTE — Telephone Encounter (Signed)
 Called and spoke to patient's wife Dickey to confirm/remind patient of their appointment at the [facility] on 08/08/24.   Appointment:   [x] Confirmed  [] Left mess   [] No answer/No voice mail  [] VM Full/unable to leave message  [] Phone not in service  Patient reminded to bring all medications and/or complete list.  Confirmed patient has transportation. Gave directions, instructed to utilize valet parking.

## 2024-08-08 ENCOUNTER — Encounter (HOSPITAL_COMMUNITY): Payer: Self-pay

## 2024-08-08 ENCOUNTER — Ambulatory Visit (HOSPITAL_COMMUNITY)
Admission: RE | Admit: 2024-08-08 | Discharge: 2024-08-08 | Disposition: A | Discharge status: Home / Self Care | Source: Ambulatory Visit | Attending: Family Medicine | Admitting: Family Medicine

## 2024-08-08 VITALS — BP 104/60 | HR 70 | Ht 66.0 in | Wt 108.2 lb

## 2024-08-08 DIAGNOSIS — E785 Hyperlipidemia, unspecified: Secondary | ICD-10-CM | POA: Insufficient documentation

## 2024-08-08 DIAGNOSIS — I484 Atypical atrial flutter: Secondary | ICD-10-CM | POA: Diagnosis not present

## 2024-08-08 DIAGNOSIS — I639 Cerebral infarction, unspecified: Secondary | ICD-10-CM

## 2024-08-08 DIAGNOSIS — Z79899 Other long term (current) drug therapy: Secondary | ICD-10-CM | POA: Insufficient documentation

## 2024-08-08 DIAGNOSIS — I4891 Unspecified atrial fibrillation: Secondary | ICD-10-CM | POA: Diagnosis not present

## 2024-08-08 DIAGNOSIS — Z951 Presence of aortocoronary bypass graft: Secondary | ICD-10-CM | POA: Diagnosis not present

## 2024-08-08 DIAGNOSIS — I480 Paroxysmal atrial fibrillation: Secondary | ICD-10-CM | POA: Diagnosis not present

## 2024-08-08 DIAGNOSIS — Z8673 Personal history of transient ischemic attack (TIA), and cerebral infarction without residual deficits: Secondary | ICD-10-CM | POA: Insufficient documentation

## 2024-08-08 DIAGNOSIS — G4733 Obstructive sleep apnea (adult) (pediatric): Secondary | ICD-10-CM | POA: Insufficient documentation

## 2024-08-08 DIAGNOSIS — I255 Ischemic cardiomyopathy: Secondary | ICD-10-CM | POA: Diagnosis not present

## 2024-08-08 DIAGNOSIS — I252 Old myocardial infarction: Secondary | ICD-10-CM | POA: Diagnosis not present

## 2024-08-08 DIAGNOSIS — J984 Other disorders of lung: Secondary | ICD-10-CM | POA: Diagnosis not present

## 2024-08-08 DIAGNOSIS — Z7901 Long term (current) use of anticoagulants: Secondary | ICD-10-CM | POA: Diagnosis not present

## 2024-08-08 DIAGNOSIS — R7989 Other specified abnormal findings of blood chemistry: Secondary | ICD-10-CM | POA: Diagnosis not present

## 2024-08-08 DIAGNOSIS — B91 Sequelae of poliomyelitis: Secondary | ICD-10-CM | POA: Insufficient documentation

## 2024-08-08 DIAGNOSIS — E7849 Other hyperlipidemia: Secondary | ICD-10-CM | POA: Diagnosis not present

## 2024-08-08 DIAGNOSIS — I5032 Chronic diastolic (congestive) heart failure: Secondary | ICD-10-CM | POA: Diagnosis not present

## 2024-08-08 DIAGNOSIS — I2510 Atherosclerotic heart disease of native coronary artery without angina pectoris: Secondary | ICD-10-CM | POA: Diagnosis not present

## 2024-08-08 MED ORDER — EMPAGLIFLOZIN 10 MG PO TABS: ORAL_TABLET | ORAL | 3 refills | Status: DC

## 2024-08-08 NOTE — Patient Instructions (Addendum)
 Good to see you today!  Take Jardiance  5 mg (1/2 tablet) Daily  Please use compression hose daily  Your physician recommends that you schedule a follow-up appointment 4 months (December)  Call office in October to schedule an appointment  If you have any questions or concerns before your next appointment please send us  a message through [place] or call our office at [phone].    TO LEAVE A MESSAGE FOR THE NURSE SELECT OPTION 2, PLEASE LEAVE A MESSAGE INCLUDING: YOUR NAME DATE OF BIRTH CALL BACK NUMBER REASON FOR CALL**this is important as we prioritize the call backs  YOU WILL RECEIVE A CALL BACK THE SAME DAY AS LONG AS YOU CALL BEFORE 4:00 PM At the [facility], you and your health needs are our priority. As part of our continuing mission to provide you with exceptional heart care, we have created designated Provider Care Teams. These Care Teams include your primary Cardiologist (physician) and Advanced Practice Providers (APPs- Physician Assistants and Nurse Practitioners) who all work together to provide you with the care you need, when you need it.   You may see any of the following providers on your designated Care Team at your next follow up: Dr Toribio Fuel Dr Ezra Shuck Dr. Ria Commander Dr. Morene Brownie Amy Lenetta, NP Caffie Shed, [place] [facility] [place], [place] Beckey Coe, NP [place] Lee, NP Ellouise Class, NP Tinnie Redman, PharmD Jaun Bash, PharmD   Please be sure to bring in all your medications bottles to every appointment.    Thank you for choosing [facility]

## 2024-08-08 NOTE — Addendum Note (Signed)
 Encounter addended by: Glena Harlene HERO, FNP on: 08/08/2024 12:49 PM  Actions taken: Clinical Note Signed

## 2024-08-09 ENCOUNTER — Other Ambulatory Visit (HOSPITAL_COMMUNITY): Payer: Self-pay

## 2024-08-09 DIAGNOSIS — I5032 Chronic diastolic (congestive) heart failure: Secondary | ICD-10-CM

## 2024-08-09 MED ORDER — EMPAGLIFLOZIN 10 MG PO TABS
ORAL_TABLET | ORAL | 3 refills | Status: AC
Start: 2024-08-09 — End: ?

## 2024-08-21 DIAGNOSIS — E7849 Other hyperlipidemia: Secondary | ICD-10-CM | POA: Diagnosis not present

## 2024-08-22 ENCOUNTER — Ambulatory Visit: Payer: Self-pay | Admitting: Pharmacist

## 2024-08-22 LAB — LIPID PANEL
Chol/HDL Ratio: 1.5 ratio (ref 0.0–5.0)
Cholesterol, Total: 91 mg/dL — ABNORMAL LOW (ref 100–199)
HDL: 59 mg/dL (ref >39)
LDL Chol Calc (NIH): 19 mg/dL (ref 0–99)
Triglycerides: 55 mg/dL (ref 0–149)
VLDL Cholesterol Cal: 13 mg/dL (ref 5–40)

## 2024-08-22 NOTE — Telephone Encounter (Signed)
 LDL at goal on Repatha , Crestor  40 mg daily  and Zetia  10 mg daily. But LFTs elevated. Pt has been referred to Gastroenterologist - has pt on 09/11. Advised to hold Crestor  and Zetia  for now until GI rule out reason for abnormal LFT. Will f/u in 4-6 weeks via phone

## 2024-09-01 ENCOUNTER — Other Ambulatory Visit (HOSPITAL_COMMUNITY): Payer: Self-pay | Admitting: Internal Medicine

## 2024-09-06 ENCOUNTER — Encounter: Payer: Self-pay | Admitting: Gastroenterology

## 2024-09-06 ENCOUNTER — Other Ambulatory Visit (INDEPENDENT_AMBULATORY_CARE_PROVIDER_SITE_OTHER)

## 2024-09-06 ENCOUNTER — Ambulatory Visit: Admitting: Gastroenterology

## 2024-09-06 VITALS — BP 122/70 | HR 75 | Ht 66.0 in | Wt 112.0 lb

## 2024-09-06 DIAGNOSIS — K760 Fatty (change of) liver, not elsewhere classified: Secondary | ICD-10-CM | POA: Diagnosis not present

## 2024-09-06 DIAGNOSIS — R748 Abnormal levels of other serum enzymes: Secondary | ICD-10-CM

## 2024-09-06 LAB — IBC + FERRITIN
Ferritin: 350 ng/mL — ABNORMAL HIGH (ref 22.0–322.0)
Iron: 100 ug/dL (ref 42–165)
Saturation Ratios: 30 % (ref 20.0–50.0)
TIBC: 333.2 ug/dL (ref 250.0–450.0)
Transferrin: 238 mg/dL (ref 212.0–360.0)

## 2024-09-06 LAB — HEPATIC FUNCTION PANEL
ALT: 144 U/L — ABNORMAL HIGH (ref 0–53)
AST: 141 U/L — ABNORMAL HIGH (ref 0–37)
Albumin: 4.6 g/dL (ref 3.5–5.2)
Alkaline Phosphatase: 99 U/L (ref 39–117)
Bilirubin, Direct: 0.3 mg/dL (ref 0.0–0.3)
Total Bilirubin: 0.9 mg/dL (ref 0.2–1.2)
Total Protein: 7.7 g/dL (ref 6.0–8.3)

## 2024-09-06 LAB — PROTIME-INR
INR: 1.4 ratio — ABNORMAL HIGH (ref 0.8–1.0)
Prothrombin Time: 14.4 s — ABNORMAL HIGH (ref 9.6–13.1)

## 2024-09-06 NOTE — Patient Instructions (Addendum)
 Please go to the lab in the basement of our building to have lab work done as you leave today. Hit B for basement when you get on the elevator.  When the doors open the lab is on your left.  We will call you with the results. Thank you.  Thank you for entrusting me with your care and for choosing [facility], Alan Coombs, P.A.-C   _______________________________________________________  If your blood pressure at your visit was 140/90 or greater, please contact your primary care physician to follow up on this.  _______________________________________________________  If you are age 19 or older, your body mass index should be between 23-30. Your Body mass index is 18.08 kg/m. If this is out of the aforementioned range listed, please consider follow up with your Primary Care Provider.  If you are age 74 or younger, your body mass index should be between 19-25. Your Body mass index is 18.08 kg/m. If this is out of the aformentioned range listed, please consider follow up with your Primary Care Provider.   ________________________________________________________  The [facility] providers would like to encourage you to use MYCHART to communicate with providers for non-urgent requests or questions.  Due to long hold times on the telephone, sending your provider a message by [facility] may be a faster and more efficient way to get a response.  Please allow 48 business hours for a response.  Please remember that this is for non-urgent requests.  _______________________________________________________  [facility] is using a team-based approach to care.  Your team is made up of your doctor and two to three APPS. Our APPS (Nurse Practitioners and Physician Assistants) work with your physician to ensure care continuity for you. They are fully qualified to address your health concerns and develop a treatment plan. They communicate directly with your gastroenterologist to care for  you. Seeing the Advanced Practice Practitioners on your physician's team can help you by facilitating care more promptly, often allowing for earlier appointments, access to diagnostic testing, procedures, and other specialty referrals.

## 2024-09-06 NOTE — Progress Notes (Signed)
 Chief Complaint:elevated LFT's Primary GI Doctor: (previously Dr. Aneita) Dr. Charlanne  HPI:  Patient is a  88  year old male patient with past medical history of GERD, IBS-D, and pancreatic cyst, who was referred to me by Rolan Barrack, MD on 07/11/24 for a evaluation of elevated LFT's .    GI history: His IBS-D was managed with Imodium and dicyclomine  prn. His reflux was well controlled on famotidine  40 mg po daily. Follow-up MRI/MRCP for pancreatic cyst. 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 As there is no observed increased risk of malignancy for such lesions smaller than 2 cm, particularly given well established stability of this lesion and advanced patient age no further follow-up or characterization is required.  Interval History    Patient presents for evaluation of elevated liver enzymes and hepatic steatosis.  Accompanied by his wife.  First we reviewed some of his medication changes in the last 6 months which include the following; Amiodarone  started in April at 400 mg, then reduced to 200 mg, and stopped in June. Patient started Repatha  in June. Patient stopped Testosterone  , vitamin D , and tadalafil in July. Patients Jardiance  reduced from 10mg  to 5mg  in August  Patient is on Crestor  and has been several years. No history of liver disease. No history of hepatitis. No family history of liver disease. No alcohol  use.  Denies recent viral illness. Denies skin changes or jaundice. Denies AMS.  Wt Readings from Last 3 Encounters:  09/06/24 112 lb (50.8 kg)  08/08/24 108 lb 3.2 oz (49.1 kg)  06/07/24 109 lb 9.6 oz (49.7 kg)    Past Medical History:  Diagnosis Date   Abnormal nuclear cardiac imaging test March 2011   Has positive EKG response, no perfusion defect and normal EF   Adenomatous colon polyp 12/2002   Arrhythmia    Atrial fibrillation (HCC)    a. s/p rfca;  b. chronic  tikosyn  and xarelto .   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 04/2017 Echo: EF 65-70%, restrictive physiology, mildly dil LA.   COPD (chronic obstructive pulmonary disease) (HCC)    Coronary artery disease    First MI in 1988. PCI in 1996 with subsequent CABG in 1996 due to restenosis. S/P stents to LCX in 2009. Noted to have residual disease in the LAD in a diffuse manner and atretic LIMA graft. He is managed medically.    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   History of Raynaud's syndrome    Hyperlipidemia    Hypertension    Internal hemorrhoids    Muscle spasm    Myocardial infarction (HCC)    MI x1 1989 - 61 age   Neuromuscular disorder (HCC)    Polio - age 68   OSA (obstructive sleep apnea) 10/16/2018   Severe OSA with AHI 37.2/hr on BiPAP   Persistent atrial fibrillation (HCC)    Polio    age 40   PVC (premature ventricular contraction)    Scoliosis    mild  Past Surgical History:  Procedure Laterality Date   ABLATION     done for a fib   APPENDECTOMY     Age 60   ATRIAL FIBRILLATION ABLATION N/A 06/08/2019   Procedure: ATRIAL FIBRILLATION ABLATION;  Surgeon: Inocencio Soyla Lunger, MD;  Location: [facility];  Service: Cardiovascular;  Laterality: N/A;   ATRIAL FIBRILLATION ABLATION N/A 11/19/2020   Procedure: ATRIAL FIBRILLATION ABLATION;  Surgeon: Inocencio Soyla Lunger, MD;  Location: [facility];  Service: Cardiovascular;  Laterality: N/A;   CARDIAC CATHETERIZATION     CARDIOVERSION N/A 08/22/2013   Procedure: CARDIOVERSION;  Surgeon: Reyes JONETTA Forget, MD;  Location: [facility] ENDOSCOPY;  Service: Cardiovascular;   Laterality: N/A;   CARDIOVERSION N/A 01/16/2014   Procedure: CARDIOVERSION;  Surgeon: Redell GORMAN Shallow, MD;  Location: [facility] ENDOSCOPY;  Service: Cardiovascular;  Laterality: N/A;   CARDIOVERSION N/A 12/11/2018   Procedure: CARDIOVERSION;  Surgeon: Rolan Ezra GORMAN, MD;  Location: [facility] ENDOSCOPY;  Service: Cardiovascular;  Laterality: N/A;   CARDIOVERSION N/A 11/20/2019   Procedure: CARDIOVERSION;  Surgeon: Kate Lonni CROME, MD;  Location: [facility] ENDOSCOPY;  Service: Endoscopy;  Laterality: N/A;   CARDIOVERSION N/A 07/10/2020   Procedure: CARDIOVERSION;  Surgeon: Rolan Ezra GORMAN, MD;  Location: [facility] ENDOSCOPY;  Service: Cardiovascular;  Laterality: N/A;   CARDIOVERSION N/A 01/21/2021   Procedure: CARDIOVERSION;  Surgeon: Rolan Ezra GORMAN, MD;  Location: [facility] ENDOSCOPY;  Service: Cardiovascular;  Laterality: N/A;   CARDIOVERSION N/A 09/30/2023   Procedure: CARDIOVERSION;  Surgeon: Michele Richardson, DO;  Location: [facility];  Service: Cardiovascular;  Laterality: N/A;   CARDIOVERSION N/A 05/22/2024   Procedure: CARDIOVERSION;  Surgeon: Michele Richardson, DO;  Location: [facility];  Service: Cardiovascular;  Laterality: N/A;   COLONOSCOPY  2008   CORONARY ARTERY BYPASS GRAFT  1996   with a LIMA to the LAD, SVG to RCA and SVG to OM   CORONARY STENT INTERVENTION N/A 03/26/2019   Procedure: CORONARY STENT INTERVENTION;  Surgeon: Dann Candyce GORMAN, MD;  Location: [facility] INVASIVE CV LAB;  Service: Cardiovascular;  Laterality: N/A;   CORONARY STENT PLACEMENT  1996   LCX   EMBOLECTOMY Right 05/09/2019   Procedure: Embolectomy of right radial artery;  Surgeon: Serene Gaile ORN, MD;  Location: [facility] OR;  Service: Vascular;  Laterality: Right;   ESOPHAGEAL DILATION     EYE SURGERY     FALSE ANEURYSM REPAIR Right 05/09/2019   Procedure: REPAIR FALSE ANEURYSM RIGHT RADIAL;  Surgeon: Serene Gaile ORN, MD;  Location: [facility] OR;  Service: Vascular;  Laterality: Right;   LEFT HEART CATH AND CORS/GRAFTS ANGIOGRAPHY N/A  03/26/2019   Procedure: LEFT HEART CATH AND CORS/GRAFTS ANGIOGRAPHY;  Surgeon: Rolan Ezra GORMAN, MD;  Location: [facility];  Service: Cardiovascular;  Laterality: N/A;   POLYPECTOMY     TONSILLECTOMY     UPPER GASTROINTESTINAL ENDOSCOPY     esophegeal dilation    Current Outpatient Medications  Medication Sig Dispense Refill   acetaminophen  (TYLENOL ) 500 MG tablet Take 250-500 mg by mouth as needed for mild pain (pain score 1-3) or moderate pain (pain score 4-6).     albuterol  (PROAIR  HFA) 108 (90 BASE) MCG/ACT inhaler Inhale 2 puffs into the lungs every 4 (four) hours as needed for wheezing or shortness of breath (and prior to exercise). 1 Inhaler 3   dicyclomine  (BENTYL ) 10 MG capsule TAKE ONE CAPSULE BY MOUTH 3 TIMES DAILY AS NEEDED FOR SPASMS 270 capsule 3   diltiazem  (CARDIZEM  CD) 120  MG 24 hr capsule TAKE 1 CAPSULE BY MOUTH EVERY DAY 30 capsule 3   ELIQUIS  2.5 MG TABS tablet TAKE 1 TABLET BY MOUTH TWICE A DAY 180 tablet 3   empagliflozin  (JARDIANCE ) 10 MG TABS tablet Take Jardiance  5 mg( 1/2 tablet) daily (Patient taking differently: Take Jardiance  5 mg( 1/2 tablet) daily ,pt taking 5 mg) 90 tablet 3   Evolocumab  (REPATHA  SURECLICK) 140 MG/ML SOAJ Inject 140 mg into the skin every 14 (fourteen) days. 6 mL 3   ezetimibe  (ZETIA ) 10 MG tablet Take 10 mg by mouth daily.     famotidine  (PEPCID ) 40 MG tablet TAKE 1 TABLET BY MOUTH EVERYDAY AT BEDTIME 90 tablet 3   loperamide (IMODIUM) 2 MG capsule Take 2 mg by mouth daily. My take a second 2 mg dose during the day as needed for loose stools     LORazepam  (ATIVAN ) 0.5 MG tablet Take 0.5 mg by mouth at bedtime.     methocarbamol  (ROBAXIN ) 500 MG tablet Take 500 mg by mouth at bedtime. [place] take a 500 mg dose twice daily as needed for muscle spasms     metoprolol  succinate (TOPROL  XL) 25 MG 24 hr tablet Take 1 tablet (25 mg total) by mouth daily. 30 tablet 6   nitroGLYCERIN  (NITROSTAT ) 0.4 MG SL tablet Place 1 tablet (0.4 mg total) under  the tongue every 5 (five) minutes as needed for chest pain. 25 tablet 0   Probiotic Product (ALIGN) 4 MG CAPS Take 4 mg by mouth every evening.     rosuvastatin  (CRESTOR ) 40 MG tablet Take 40 mg by mouth daily.     traZODone  (DESYREL ) 50 MG tablet Take 50 mg by mouth at bedtime.     Cholecalciferol (VITAMIN D3) 50 MCG (2000 UT) CAPS Take 2,000 Units by mouth every evening. (Patient not taking: Reported on 09/06/2024)     furosemide  (LASIX ) 20 MG tablet Take 1 tablet (20 mg total) by mouth 2 (two) times a week. (Patient not taking: Reported on 09/06/2024) 30 tablet 4   tadalafil (CIALIS) 10 MG tablet Take 10 mg by mouth daily as needed (cold hands and feet). (Patient not taking: Reported on 09/06/2024)     TESTOSTERONE  TD Place 1 Application onto the skin daily. 100 mg dose (Patient not taking: Reported on 09/06/2024)     No current facility-administered medications for this visit.    Allergies as of 09/06/2024 - Review Complete 09/06/2024  Allergen Reaction Noted   Morphine  and codeine Other (See Comments) 08/19/2011   Tape Other (See Comments) 03/25/2019    Family History  Problem Relation Age of Onset   Heart disease Mother    Heart disease Father    Heart Problems Brother        heart transplant   Heart disease Maternal Grandmother    Heart disease Maternal Grandfather    Heart disease Paternal Grandmother    Heart disease Paternal Grandfather    Prostate cancer Paternal Uncle    Cancer Son        thymic cancer.  died at age 13   Colon cancer Neg Hx    Esophageal cancer Neg Hx    Pancreatic cancer Neg Hx    Rectal cancer Neg Hx    Stomach cancer Neg Hx     Review of Systems:    Constitutional: No weight loss, fever, chills, weakness or fatigue HEENT: Eyes: No change in vision  Ears, Nose, Throat:  No change in hearing or congestion Skin: No rash or itching Cardiovascular: No chest pain, chest pressure or palpitations   Respiratory: No SOB or  cough Gastrointestinal: See HPI and otherwise negative Genitourinary: No dysuria or change in urinary frequency Neurological: No headache, dizziness or syncope Musculoskeletal: No new muscle or joint pain Hematologic: No bleeding or bruising Psychiatric: No history of depression or anxiety    Physical Exam:  Vital signs: BP 122/70   Pulse 75   Ht 5' 6 (1.676 m)   Wt 112 lb (50.8 kg)   BMI 18.08 kg/m   Constitutional: Pleasant male appears to be in NAD, Well developed, Well nourished, alert and cooperative Throat: Oral cavity and pharynx without inflammation, swelling or lesion.  Respiratory: Respirations even and unlabored. Lungs clear to auscultation bilaterally.   No wheezes, crackles, or rhonchi.  Cardiovascular: Normal S1, S2. Regular rate and rhythm. No peripheral edema, cyanosis or pallor.  Gastrointestinal:  Soft, nondistended, nontender. No rebound or guarding. Normal bowel sounds. No appreciable masses or hepatomegaly. Rectal:  Not performed.  Msk:  Symmetrical without gross deformities. Without edema, no deformity or joint abnormality.  Neurologic:  Alert and  oriented x4;  grossly normal neurologically. No asterixis  Skin:   Dry and intact without significant lesions or rashes. Psychiatric: Oriented to person, place and time. Demonstrates good judgement and reason without abnormal affect or behaviors.  RELEVANT LABS AND IMAGING: CBC    Latest Ref Rng & Units 05/16/2024    9:12 AM 09/30/2023   10:11 AM 06/23/2023    3:23 PM  CBC  WBC 3.4 - 10.8 x10E3/uL 6.9   6.8   Hemoglobin 13.0 - 17.7 g/dL 83.8  82.9  84.3   Hematocrit 37.5 - 51.0 % 48.6  50.0  48.1   Platelets 150 - 450 x10E3/uL 156   154      CMP     Latest Ref Rng & Units 07/10/2024   10:36 AM 06/27/2024   10:13 AM 06/07/2024    3:14 PM  CMP  Glucose 70 - 99 mg/dL   94   BUN 8 - 23 mg/dL   19   Creatinine 9.38 - 1.24 mg/dL   8.65   Sodium 864 - 854 mmol/L   135   Potassium 3.5 - 5.1 mmol/L   4.5    Chloride 98 - 111 mmol/L   97   CO2 22 - 32 mmol/L   27   Calcium  8.9 - 10.3 mg/dL   9.5   Total Protein 6.5 - 8.1 g/dL 6.7  6.9  7.0   Total Bilirubin 0.0 - 1.2 mg/dL 1.0  1.0  1.3   Alkaline Phos 38 - 126 U/L 154  130  134   AST 15 - 41 U/L 92  77  54   ALT 0 - 44 U/L 113  106  81     Lab Results  Component Value Date   TSH 2.774 06/07/2024   07/10/24 labs show: Hep B E Ab non reactive,  HCV Ab non reactive, Hep A IgM non reactive  07/04/24 ABD US  liver IMPRESSION: 1. Mildly heterogeneous liver parenchyma, suggestive of hepatic steatosis. No discrete lesions. 2. Unremarkable gallbladder and common bile duct.  Fibrosis 4 Score = 4.77  Fib-4 interpretation is not validated for people under 35 or over 109 years of age. However, scores under 2.0 are generally considered low risk.   Assessment:    88 year old male patient who presents with  elevated LFTs since June 2025 and abdominal ultrasound that shows hepatic steatosis.  No gallstones or common bile duct dilatation.  MRI/MRCP from February 2024 showed no evidence of liver disease.  Patient does not consume alcohol .Hep B E Ab non reactive,  HCV Ab non reactive, Hep A IgM non reactive.  As discussed above patient was placed on amiodarone  back in June which can cause elevations in LFTs.  Patient was also on testosterone  that was discontinued which cau also affect LFTs.  Patient currently is taking statin and has been for several years.  Will go ahead and recheck hepatic panel today to see if they have normalized.  Will also order lab work to rule out autoimmune hepatitis.  Will order additional lab work for fib 4 score.   Plan: -  ANA, AMA, Anti-smooth muscle antibody, ferritin, TIBC,  Alpha 1 antitrypsin, tTG, total IgA, PT/INR, IgG, hepatic panel     Thank you for the courtesy of this consult. Please call me with any questions or concerns.   Adiel Erney, FNP-C [facility] 09/06/2024, 2:31 PM  Cc: Shepard Ade, MD

## 2024-09-09 LAB — ANA: Anti Nuclear Antibody (ANA): POSITIVE — AB

## 2024-09-09 LAB — ANTI-NUCLEAR AB-TITER (ANA TITER): ANA Titer 1: 1:40 {titer} — ABNORMAL HIGH

## 2024-09-09 LAB — IGG: IgG (Immunoglobin G), Serum: 820 mg/dL (ref 600–1540)

## 2024-09-09 LAB — IGA: Immunoglobulin A: 222 mg/dL (ref 70–320)

## 2024-09-09 LAB — ANTI-SMOOTH MUSCLE ANTIBODY, IGG: Actin (Smooth Muscle) Antibody (IGG): <20 U (ref <20)

## 2024-09-09 LAB — TISSUE TRANSGLUTAMINASE, IGA: (tTG) Ab, IgA: <1 U/mL

## 2024-09-09 LAB — ALPHA-1-ANTITRYPSIN: A-1 Antitrypsin, Ser: 155 mg/dL (ref 83–199)

## 2024-09-12 ENCOUNTER — Ambulatory Visit: Payer: Self-pay | Admitting: Gastroenterology

## 2024-09-14 ENCOUNTER — Telehealth: Payer: Self-pay | Admitting: Gastroenterology

## 2024-09-14 NOTE — Telephone Encounter (Signed)
 Called patient and wife to discuss his lab results and dr leonardo recommendations for repeat labs in 2 weeks and MRI.  Agrees to plan. Also discussed with him recommendations to stop crestor  and zetia  if ok with cardiology.   I did message cardiologist Dr. Rolan to inform him of recommendations Also sent lab work to PCP and cardiologist

## 2024-09-14 NOTE — Telephone Encounter (Signed)
 Patient and spouse returning call. Please advise.   Thank you

## 2024-09-14 NOTE — Progress Notes (Signed)
 AST 141, ALT 144 (rising) US -diffuse fatty liver. Glad he is off amiodarone  WU neg. ANA is weakly positive  Plan: -Stop Zetia , minimize Tylenol  -Ideally would hold Crestor .  He needs to get in touch with cardiology regarding that -Proceed with MRI liver/MRCP to reevaluate liver and IPMN -Recheck LFTs, check INR in 2 weeks -Not a candidate for liver biopsy currently.  Patient on Eliquis .  Risks may outweigh benefits. Send report to family physician

## 2024-09-17 ENCOUNTER — Telehealth: Payer: Self-pay | Admitting: Pharmacist

## 2024-09-17 NOTE — Telephone Encounter (Signed)
 Received a voicemail from the patient's wife regarding Lipitor and Zetia . She stated that the GI specialist advised the patient to stop both medications for now. Review of Previous Notes: As per documentation from 08/27, the patient was previously advised to hold/stop Crestor  and Zetia  due to elevated liver function tests (LFTs).  Returned the call and spoke with the patient's wife. Confirmed that the patient should remain off Crestor  and Zetia  at this time.  Per the wife, the GI specialist plans to repeat LFTs in a few weeks to monitor for improvement.

## 2024-09-18 ENCOUNTER — Telehealth: Payer: Self-pay | Admitting: Gastroenterology

## 2024-09-18 NOTE — Telephone Encounter (Signed)
 Inbound call from patient wife stating that they were told that someone would be reaching out to them to schedule an MRI but they have not heard from anyone as of yet. Patient wife is requesting a call back at [phone]. Please advise.

## 2024-09-20 ENCOUNTER — Telehealth: Payer: Self-pay | Admitting: Gastroenterology

## 2024-09-20 NOTE — Telephone Encounter (Signed)
 Inbound call from patient and wife stating for patients last visit on 09/06/24 results were given to them on 09/14/24 and they were told he would need to repeat labs, they are wanting to know if they will receive a call to repeat those labs or they have to be seen in office before. Requesting a call back  Please advise  Thank you

## 2024-09-21 ENCOUNTER — Other Ambulatory Visit: Payer: Self-pay | Admitting: *Deleted

## 2024-09-21 DIAGNOSIS — R748 Abnormal levels of other serum enzymes: Secondary | ICD-10-CM

## 2024-09-21 DIAGNOSIS — K760 Fatty (change of) liver, not elsewhere classified: Secondary | ICD-10-CM

## 2024-09-21 NOTE — Telephone Encounter (Signed)
 See telephone contact from 09/18/24.

## 2024-09-21 NOTE — Progress Notes (Signed)
 mri

## 2024-09-21 NOTE — Telephone Encounter (Signed)
 Patient and patient's wife informed of MRI date. Patient has been holding both Crestor  and Zetia . Informed patient to repeat labs as well.

## 2024-09-21 NOTE — Telephone Encounter (Signed)
 MRI scheduled for Thursday 09/27/24 @ 8 am.

## 2024-09-27 ENCOUNTER — Ambulatory Visit: Payer: Self-pay | Admitting: Gastroenterology

## 2024-09-27 ENCOUNTER — Ambulatory Visit (HOSPITAL_COMMUNITY)
Admission: RE | Admit: 2024-09-27 | Discharge: 2024-09-27 | Disposition: A | Discharge status: Home / Self Care | Source: Ambulatory Visit | Attending: Gastroenterology | Admitting: Gastroenterology

## 2024-09-27 ENCOUNTER — Other Ambulatory Visit: Payer: Self-pay | Admitting: Gastroenterology

## 2024-09-27 ENCOUNTER — Other Ambulatory Visit

## 2024-09-27 DIAGNOSIS — K760 Fatty (change of) liver, not elsewhere classified: Secondary | ICD-10-CM

## 2024-09-27 DIAGNOSIS — R748 Abnormal levels of other serum enzymes: Secondary | ICD-10-CM

## 2024-09-27 DIAGNOSIS — K862 Cyst of pancreas: Secondary | ICD-10-CM | POA: Diagnosis not present

## 2024-09-27 LAB — HEPATIC FUNCTION PANEL
ALT: 34 U/L (ref 0–53)
AST: 34 U/L (ref 0–37)
Albumin: 4.4 g/dL (ref 3.5–5.2)
Alkaline Phosphatase: 125 U/L — ABNORMAL HIGH (ref 39–117)
Bilirubin, Direct: 0.2 mg/dL (ref 0.0–0.3)
Total Bilirubin: 0.8 mg/dL (ref 0.2–1.2)
Total Protein: 7.1 g/dL (ref 6.0–8.3)

## 2024-09-27 LAB — PROTIME-INR
INR: 1.6 ratio — ABNORMAL HIGH (ref 0.8–1.0)
Prothrombin Time: 16.3 s — ABNORMAL HIGH (ref 9.6–13.1)

## 2024-09-27 MED ORDER — GADOBUTROL 1 MMOL/ML IV SOLN
5.0000 mL | Freq: Once | INTRAVENOUS | Status: AC | PRN
Start: 2024-09-27 — End: 2024-09-27
  Administered 2024-09-27: 5 mL via INTRAVENOUS

## 2024-09-28 ENCOUNTER — Ambulatory Visit: Payer: Self-pay | Admitting: Gastroenterology

## 2024-09-28 ENCOUNTER — Other Ambulatory Visit: Payer: Self-pay

## 2024-09-28 DIAGNOSIS — R748 Abnormal levels of other serum enzymes: Secondary | ICD-10-CM

## 2024-09-28 DIAGNOSIS — K760 Fatty (change of) liver, not elsewhere classified: Secondary | ICD-10-CM

## 2024-10-05 ENCOUNTER — Telehealth (HOSPITAL_COMMUNITY): Payer: Self-pay

## 2024-10-05 ENCOUNTER — Other Ambulatory Visit (HOSPITAL_COMMUNITY): Payer: Self-pay

## 2024-10-05 NOTE — Telephone Encounter (Signed)
 Patients wife called to report that GI has stopped patients Crestor  and Zetia  due to high LFTS but he is still taking Repatha . Medications have been removed from patients medication list.

## 2024-10-05 NOTE — Telephone Encounter (Signed)
 Per plan unable to process request since PA approval is still on file. Patient has HTA.

## 2024-10-10 ENCOUNTER — Other Ambulatory Visit: Payer: Self-pay

## 2024-10-10 DIAGNOSIS — R748 Abnormal levels of other serum enzymes: Secondary | ICD-10-CM

## 2024-10-10 DIAGNOSIS — K760 Fatty (change of) liver, not elsewhere classified: Secondary | ICD-10-CM

## 2024-10-10 NOTE — Progress Notes (Signed)
 Repeat labs put in

## 2024-10-10 NOTE — Telephone Encounter (Signed)
 Inbound call from patient's wife requesting a call to discuss lab work further. Please advise, thank you

## 2024-10-12 ENCOUNTER — Other Ambulatory Visit (HOSPITAL_COMMUNITY): Payer: Self-pay

## 2024-10-12 NOTE — Telephone Encounter (Signed)
-----   Message from [place] sent at 10/10/2024 11:50 AM EDT -----

## 2024-10-17 ENCOUNTER — Telehealth: Payer: Self-pay | Admitting: Gastroenterology

## 2024-10-17 NOTE — Telephone Encounter (Signed)
 Inbound call from [place] patients wife requesting to speak with Karna. She states that patient will  be going to see Dr. Shepard and is wanting to see if we can fax some records to his office. Please advise.

## 2024-10-18 ENCOUNTER — Ambulatory Visit: Payer: Self-pay | Admitting: Physician Assistant

## 2024-10-18 ENCOUNTER — Other Ambulatory Visit (INDEPENDENT_AMBULATORY_CARE_PROVIDER_SITE_OTHER)

## 2024-10-18 DIAGNOSIS — R748 Abnormal levels of other serum enzymes: Secondary | ICD-10-CM

## 2024-10-18 DIAGNOSIS — K760 Fatty (change of) liver, not elsewhere classified: Secondary | ICD-10-CM | POA: Diagnosis not present

## 2024-10-18 LAB — HEPATIC FUNCTION PANEL
ALT: 30 U/L (ref 0–53)
AST: 32 U/L (ref 0–37)
Albumin: 4.3 g/dL (ref 3.5–5.2)
Alkaline Phosphatase: 101 U/L (ref 39–117)
Bilirubin, Direct: 0.2 mg/dL (ref 0.0–0.3)
Total Bilirubin: 0.7 mg/dL (ref 0.2–1.2)
Total Protein: 7.3 g/dL (ref 6.0–8.3)

## 2024-10-23 ENCOUNTER — Telehealth: Payer: Self-pay | Admitting: Gastroenterology

## 2024-10-23 NOTE — Telephone Encounter (Signed)
 Spoke with patient and his wife about most recent labs and imaging. We reviewed everything and reassured them LFTs have improved. Answered all their questions and concerns.

## 2024-11-13 ENCOUNTER — Encounter (HOSPITAL_COMMUNITY): Payer: Self-pay | Admitting: Internal Medicine

## 2024-11-13 ENCOUNTER — Ambulatory Visit (HOSPITAL_COMMUNITY)
Admission: RE | Admit: 2024-11-13 | Discharge: 2024-11-13 | Disposition: A | Discharge status: Home / Self Care | Source: Ambulatory Visit | Attending: Internal Medicine | Admitting: Internal Medicine

## 2024-11-13 VITALS — BP 128/66 | HR 72 | Ht 66.0 in | Wt 115.4 lb

## 2024-11-13 DIAGNOSIS — I480 Paroxysmal atrial fibrillation: Secondary | ICD-10-CM

## 2024-11-13 DIAGNOSIS — I4819 Other persistent atrial fibrillation: Secondary | ICD-10-CM

## 2024-11-13 DIAGNOSIS — Z5181 Encounter for therapeutic drug level monitoring: Secondary | ICD-10-CM

## 2024-11-13 DIAGNOSIS — D6869 Other thrombophilia: Secondary | ICD-10-CM | POA: Diagnosis not present

## 2024-11-13 NOTE — Progress Notes (Signed)
 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 The patient was initially diagnosed with atrial fibrillation several years ago and is s/p afib ablation 06/25/14 at [facility] by Dr. Craig and has been maintained on Tikosyn  since. He has had some possible a fib, atrial flutter vs ectopic atrial rhythm. On 12/07/18, he was noted to be in an ectopic atrial rhythm with 2:1 block and was cardioverted on 12/11/18. On review of his ECGs, it appears he had some transient AV dissociation post cardioversion but is back in SR on follow up. He has been asymptomatic. He denies bleeding issues with anticoagulation. He has OSA and struggles with using his CPAP. He is now on an oral device and follows with Dr Shlomo and Dr Melba.  Patient is s/p repeat afib ablation 06/08/19 with Dr Inocencio. Patient admitted 10/24-10/26/20 with acute CVA after holding anticoagulation for oral surgery. He is on Eliquis  for a CHADS2VASC score of 7. Patient is s/p DCCV 11/20/19. Patient was back in atrial flutter at his visit with Dr Inocencio on 06/10/20. His BB was stopped at that point 2/2 side effects of fatigue. Patient reports he feels like he has more energy off the BB however, his heart rate had been elevated around 120-130 at home. Patient is s/p DCCV on 07/10/20.   On follow up today, patient is s/p repeat ablation 11/19/20 with Dr Inocencio. He reports that he has noted an irregular pulse on his home BP machine starting 12/5. He does have some fatigue but otherwise feels well. ECG today shows sinus rhythm with frequent junctional beats. He denies CP, swallowing, or groin issues.  On follow up 10/19/23, he is currently in atrial flutter.  S/p successful DCCV on 09/30/23. He is taking Tikosyn  250 mcg BID. He states he is not surprised he is back out of rhythm.   On follow up 05/09/24, he is currently in junctional bradycardia vs atrial flutter. He was last seen by Dr. Inocencio on 4/14 and after discussion transitioned from Tikosyn  to amiodarone . He is taking amiodarone  200 mg twice daily. He fell twice yesterday due to being dizzy; he fell on carpet and did not hit his head. No missed doses of Eliquis  2.5 mg BID.   On follow up 05/16/24, he is currently in Afib (HR 72). He was in a marked bradycardia last visit that was possibly underlying atrial flutter. Patient was transitioned to amiodarone  200 mg once daily, Toprol  discontinued (Dr. Rolan started it for rate control earlier this year), and diltiazem  decreased to 120 mg daily. He feels ongoing dizziness since last visit. No missed doses of Eliquis  2.5 mg BID.   On follow-up 11/13/2024, patient is currently in rate controlled Afib.  He stopped amiodarone  in June after recommendation given by Dr. Rolan.  No bleeding issues on Eliquis .  Today, he denies symptoms of palpitations, SOB, chest pain, orthopnea, PND, lower extremity edema, presyncope, syncope, bleeding, or neurologic sequela. The patient is tolerating medications without difficulties and is otherwise without complaint today.    Atrial Fibrillation Risk Factors:  he does have symptoms or diagnosis of sleep apnea. he is compliant with OSA therapy. Now on oral appliance.    he has a BMI of Body mass index  is 18.63 kg/m.SABRA Filed Weights   11/13/24 1045  Weight: 52.3 kg    Atrial Fibrillation Management history:  Previous antiarrhythmic drugs: Tikosyn , amiodarone  Previous cardioversions: 12/11/18, 11/20/19, 07/10/20, 09/30/23, 05/22/24 Previous ablations: 2015 at [facility], 06/08/19, 11/19/20 CHADS2VASC score: 6 (age, HTN, CAD, CVA) Anticoagulation history: Xarelto , Eliquis    Past Medical History:  Diagnosis Date   Abnormal  nuclear cardiac imaging test March 2011   Has positive EKG response, no perfusion defect and normal EF   Adenomatous colon polyp 12/2002   Arrhythmia    Atrial fibrillation (HCC)    a. s/p rfca;  b. chronic tikosyn  and xarelto .   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 04/2017 Echo: EF 65-70%, restrictive physiology, mildly dil LA.   COPD (chronic obstructive pulmonary disease) (HCC)    Coronary artery disease    First MI in 1988. PCI in 1996 with subsequent CABG in 1996 due to restenosis. S/P stents to LCX in 2009. Noted to have residual disease in the LAD in a diffuse manner and atretic LIMA graft. He is managed medically.    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   History of Raynaud's syndrome    Hyperlipidemia    Hypertension    Internal hemorrhoids    Muscle spasm    Myocardial infarction (HCC)    MI x1 1989 - 18 age   Neuromuscular disorder (HCC)    Polio - age 69   OSA (obstructive sleep apnea) 10/16/2018   Severe OSA with AHI 37.2/hr on BiPAP   Persistent atrial fibrillation (HCC)    Polio    age 89   PVC (premature ventricular contraction)    Scoliosis    mild   Past Surgical History:  Procedure Laterality Date   ABLATION     done for a fib   APPENDECTOMY     Age 89   ATRIAL FIBRILLATION ABLATION N/A 06/08/2019   Procedure: ATRIAL FIBRILLATION ABLATION;  Surgeon: Inocencio Soyla Lunger, MD;  Location: [facility] INVASIVE CV LAB;  Service: Cardiovascular;  Laterality: N/A;   ATRIAL FIBRILLATION ABLATION N/A 11/19/2020   Procedure: ATRIAL FIBRILLATION ABLATION;  Surgeon: Inocencio Soyla Lunger, MD;  Location: [facility] INVAS[facility] CV LAB;   Service: Cardiovascular;  Laterality: N/A;   CARDIAC CATHETERIZATION     CARDIOVERSION N/A 08/22/2013   Procedure: CARDIOVERSION;  Surgeon: Reyes JONETTA Forget, MD;  Location: [facility] ENDOSCOPY;  Service: Cardiovascular;  Laterality: N/A;   CARDIOVERSION N/A 01/16/2014   Procedure: CARDIOVERSION;  Surgeon: Redell GORMAN Shallow, MD;  Location: [facility] ENDOSCOPY;  Service: Cardiovascular;  Laterality: N/A;   CARDIOVERSION N/A 12/11/2018   Procedure: CARDIOVERSION;  Surgeon: Rolan Ezra GORMAN, MD;  Location: [facility] ENDOSCOPY;  Service: Cardiovascular;  Laterality: N/A;   CARDIOVERSION N/A 11/20/2019   Procedure: CARDIOVERSION;  Surgeon: Kate Lonni CROME, MD;  Location: [facility] ENDOSCOPY;  Service: Endoscopy;  Laterality: N/A;   CARDIOVERSION N/A 07/10/2020   Procedure: CARDIOVERSION;  Surgeon: Rolan Ezra GORMAN, MD;  Location: [facility] ENDOSCOPY;  Service: Cardiovascular;  Laterality: N/A;   CARDIOVERSION N/A 01/21/2021   Procedure: CARDIOVERSION;  Surgeon: Rolan Ezra GORMAN, MD;  Location:  [facility] ENDOSCOPY;  Service: Cardiovascular;  Laterality: N/A;   CARDIOVERSION N/A 09/30/2023   Procedure: CARDIOVERSION;  Surgeon: Michele Richardson, DO;  Location: [facility] INVASIVE CV LAB;  Service: Cardiovascular;  Laterality: N/A;   CARDIOVERSION N/A 05/22/2024   Procedure: CARDIOVERSION;  Surgeon: Michele Richardson, DO;  Location: [facility]VAS[facility] CV LAB;  Service: Cardiovascular;  Laterality: N/A;   COLONOSCOPY  2008   CORONARY ARTERY BYPASS GRAFT  1996   with a LIMA to the LAD, SVG to RCA and SVG to OM   CORONARY STENT INTERVENTION N/A 03/26/2019   Procedure: CORONARY STENT INTERVENTION;  Surgeon: Dann Candyce RAMAN, MD;  Location: [facility] INVASIVE CV LAB;  Service: Cardiovascular;  Laterality: N/A;   CORONARY STENT PLACEMENT  1996   LCX   EMBOLECTOMY Right 05/09/2019   Procedure: Embolectomy of right radial artery;  Surgeon: Serene Gaile ORN, MD;  Location: [facility] OR;  Service: Vascular;  Laterality: Right;   ESOPHAGEAL DILATION     EYE SURGERY     FALSE ANEURYSM  REPAIR Right 05/09/2019   Procedure: REPAIR FALSE ANEURYSM RIGHT RADIAL;  Surgeon: Serene Gaile ORN, MD;  Location: [facility] OR;  Service: Vascular;  Laterality: Right;   LEFT HEART CATH AND CORS/GRAFTS ANGIOGRAPHY N/A 03/26/2019   Procedure: LEFT HEART CATH AND CORS/GRAFTS ANGIOGRAPHY;  Surgeon: Rolan Ezra RAMAN, MD;  Location: [facility] INVASIVE CV LAB;  Service: Cardiovascular;  Laterality: N/A;   POLYPECTOMY     TONSILLECTOMY     UPPER GASTROINTESTINAL ENDOSCOPY     esophegeal dilation    Current Outpatient Medications  Medication Sig Dispense Refill   acetaminophen  (TYLENOL ) 500 MG tablet Take 250-500 mg by mouth as needed for mild pain (pain score 1-3) or moderate pain (pain score 4-6).     albuterol  (PROAIR  HFA) 108 (90 BASE) MCG/ACT inhaler Inhale 2 puffs into the lungs every 4 (four) hours as needed for wheezing or shortness of breath (and prior to exercise). 1 Inhaler 3   dicyclomine  (BENTYL ) 10 MG capsule TAKE ONE CAPSULE BY MOUTH 3 TIMES DAILY AS NEEDED FOR SPASMS 270 capsule 3   diltiazem  (CARDIZEM  CD) 120 MG 24 hr capsule TAKE 1 CAPSULE BY MOUTH EVERY DAY 30 capsule 3   ELIQUIS  2.5 MG TABS tablet TAKE 1 TABLET BY MOUTH TWICE A DAY 180 tablet 3   empagliflozin  (JARDIANCE ) 10 MG TABS tablet Take Jardiance  5 mg( 1/2 tablet) daily (Patient taking differently: Take Jardiance  5 mg( 1/2 tablet) daily ,pt taking 5 mg) 90 tablet 3   Evolocumab  (REPATHA  SURECLICK) 140 MG/ML SOAJ Inject 140 mg into the skin every 14 (fourteen) days. 6 mL 3   famotidine  (PEPCID ) 40 MG tablet TAKE 1 TABLET BY MOUTH EVERYDAY AT BEDTIME 90 tablet 3   furosemide  (LASIX ) 20 MG tablet Take 1 tablet (20 mg total) by mouth 2 (two) times a week. 30 tablet 4   loperamide (IMODIUM) 2 MG capsule Take 2 mg by mouth daily. My take a second 2 mg dose during the day as needed for loose stools     LORazepam  (ATIVAN ) 0.5 MG tablet Take 0.5 mg by mouth at bedtime.     methocarbamol  (ROBAXIN ) 500 MG tablet Take 500 mg by mouth at bedtime. May  take a 500 mg dose twice daily as needed for muscle spasms     metoprolol  succinate (TOPROL  XL) 25 MG 24 hr tablet Take 1 tablet (25 mg total) by mouth daily. 30 tablet 6   nitroGLYCERIN  (NITROSTAT ) 0.4 MG SL tablet Place 1 tablet (0.4 mg  total) under the tongue every 5 (five) minutes as needed for chest pain. 25 tablet 0   Probiotic Product (ALIGN) 4 MG CAPS Take 4 mg by mouth every evening.     traZODone  (DESYREL ) 50 MG tablet Take 50 mg by mouth at bedtime.     No current facility-administered medications for this encounter.    Allergies  Allergen Reactions   Morphine  And Codeine Other (See Comments)    IV forms- vein irritation   Tape Other (See Comments)    Skin Irritation (Plastic)   ROS- All systems are reviewed and negative except as per the HPI above.  Physical Exam: Vitals:   11/13/24 1045  BP: 128/66  Pulse: 72  Weight: 52.3 kg  Height: 5' 6 (1.676 m)    GEN- The patient is well appearing, alert and oriented x 3 today.   Neck - no JVD or carotid bruit noted Lungs- Clear to ausculation bilaterally, normal work of breathing Heart- Irregular rate and rhythm, no murmurs, rubs or gallops, PMI not laterally displaced Extremities- no clubbing, cyanosis, or edema Skin - no rash or ecchymosis noted  Wt Readings from Last 3 Encounters:  11/13/24 52.3 kg  09/06/24 50.8 kg  08/08/24 49.1 kg    EKG today demonstrates  Vent. rate 72 BPM PR interval * ms QRS duration 104 ms QT/QTcB 426/466 ms P-R-T axes * 73 -72 Atrial fibrillation ST & T wave abnormality, consider inferior ischemia Prolonged QT Abnormal ECG When compared with ECG of 07-Jun-2024 15:01, Previous ECG is present   Echo 08/03/22 demonstrated   1. Left ventricular ejection fraction, by estimation, is 55 to 60%. The  left ventricle has normal function. The left ventricle has no regional  wall motion abnormalities. Left ventricular diastolic parameters are  consistent with Grade II diastolic   dysfunction (pseudonormalization).   2. Right ventricular systolic function is mildly reduced. The right  ventricular size is mildly enlarged. There is mildly elevated pulmonary  artery systolic pressure. The estimated right ventricular systolic  pressure is 41.4 mmHg.   3. Left atrial size was moderately dilated.   4. Right atrial size was mildly dilated.   5. The mitral valve is degenerative. Mild to moderate mitral valve  regurgitation. No evidence of mitral stenosis. Moderate to severe mitral  annular calcification.   6. The aortic valve is tricuspid. There is moderate calcification of the  aortic valve. Aortic valve regurgitation is not visualized. Aortic valve  sclerosis is present, with no evidence of aortic valve stenosis.   7. The inferior vena cava is normal in size with <50% respiratory  variability, suggesting right atrial pressure of 8 mmHg.   8. There appears to be a PFO by color doppler.   Epic records reviewed.  CHA2DS2-VASc Score = 7  The patient's score is based upon: CHF History: 1 HTN History: 1 Diabetes History: 0 Stroke History: 2 Vascular Disease History: 1 Age Score: 2 Gender Score: 0      1. Persistent atrial fibrillation/atypical atrial flutter S/p repeat afib ablation with Dr Inocencio 06/08/19. Per procedure report, flutter unable to be ablated due to complexity of atrial flutter. S/p repeat ablation 11/19/20 S/p DCCV on 09/30/23.  S/p DCCV on 05/22/24.  Patient is currently in Afib.  Decision made by Dr. McClain to stop amiodarone  in June following early return of A-fib post cardioversion.  He is currently rate controlled on diltiazem  and metoprolol .  I will not make any changes at this time to medication regimen.  2.  Secondary hypercoagulable state due to atrial fibrillation No missed doses of Eliquis .  3. Obstructive sleep apnea Patient is compliant with oral device.  4. CAD/Ischemic CM No chest pain.  5. HTN Stable today.     Follow up  A-fib clinic as needed.    Dorn Heinrich, PA-C [facility] Clinic [facility] [address] [place], [place] [address] [phone]

## 2024-11-14 ENCOUNTER — Telehealth (HOSPITAL_COMMUNITY): Payer: Self-pay | Admitting: Cardiology

## 2024-11-14 DIAGNOSIS — I5032 Chronic diastolic (congestive) heart failure: Secondary | ICD-10-CM

## 2024-11-14 NOTE — Telephone Encounter (Addendum)
 1.Patients wife called to request pre visit labs prior to upcoming appt with Dr Rolan 12/3  -requesting lipids and metabolic panel   2. Pts wife also requested call from Harlene HERO, NP to discuss the reasons behind pre visit labs

## 2024-11-15 ENCOUNTER — Telehealth: Payer: Self-pay | Admitting: Pharmacist

## 2024-11-15 DIAGNOSIS — E7849 Other hyperlipidemia: Secondary | ICD-10-CM

## 2024-11-15 NOTE — Telephone Encounter (Signed)
 Lab appt scheduled.

## 2024-11-15 NOTE — Telephone Encounter (Signed)
 Patient was taken off of Crestor  and Zetia  due to LDLc level of 11 after Repatha  addition so want to get updated lab just on [id number] to see if LDLc still remained at goal. Lipid lab ordered for Nov 19, 2024

## 2024-11-19 ENCOUNTER — Ambulatory Visit (HOSPITAL_COMMUNITY)
Admission: RE | Admit: 2024-11-19 | Discharge: 2024-11-19 | Disposition: A | Discharge status: Home / Self Care | Source: Ambulatory Visit | Attending: Cardiology | Admitting: Cardiology

## 2024-11-19 DIAGNOSIS — I5032 Chronic diastolic (congestive) heart failure: Secondary | ICD-10-CM | POA: Insufficient documentation

## 2024-11-19 DIAGNOSIS — E7849 Other hyperlipidemia: Secondary | ICD-10-CM | POA: Diagnosis not present

## 2024-11-19 DIAGNOSIS — R7989 Other specified abnormal findings of blood chemistry: Secondary | ICD-10-CM | POA: Insufficient documentation

## 2024-11-19 LAB — BASIC METABOLIC PANEL WITH GFR
Anion gap: 11 (ref 5–15)
BUN: 15 mg/dL (ref 8–23)
CO2: 30 mmol/L (ref 22–32)
Calcium: 10.2 mg/dL (ref 8.9–10.3)
Chloride: 95 mmol/L — ABNORMAL LOW (ref 98–111)
Creatinine, Ser: 1.15 mg/dL (ref 0.61–1.24)
GFR, Estimated: >60 mL/min (ref >60)
Glucose, Bld: 96 mg/dL (ref 70–99)
Potassium: 4.5 mmol/L (ref 3.5–5.1)
Sodium: 136 mmol/L (ref 135–145)

## 2024-11-19 LAB — LIPID PANEL
Cholesterol: 147 mg/dL (ref 0–200)
HDL: 64 mg/dL (ref >40)
LDL Cholesterol: 69 mg/dL (ref 0–99)
Total CHOL/HDL Ratio: 2.3 ratio
Triglycerides: 70 mg/dL (ref <150)
VLDL: 14 mg/dL (ref 0–40)

## 2024-11-19 NOTE — Addendum Note (Signed)
 Encounter addended by: Marcelina Lisa HERO, RN on: 11/19/2024 11:23 AM  Actions taken: Visit diagnoses modified, Order list changed, Diagnosis association updated

## 2024-11-20 ENCOUNTER — Ambulatory Visit (HOSPITAL_COMMUNITY): Payer: Self-pay | Admitting: Family Medicine

## 2024-11-20 NOTE — Telephone Encounter (Signed)
 Lipid lab discussed with the pat and wife over the phone. As LDLc slightly above the goal on Repatha . Advised patient to restart Crestor  at lower dose 20 mg daily ( previously tolerated dose was 40 mg daily). Pt has appointment with Dr.McLean will wants to discuss with him first.

## 2024-11-28 ENCOUNTER — Ambulatory Visit (HOSPITAL_COMMUNITY)
Admission: RE | Admit: 2024-11-28 | Discharge: 2024-11-28 | Disposition: A | Source: Ambulatory Visit | Attending: Cardiology | Admitting: Cardiology

## 2024-11-28 VITALS — BP 130/60 | HR 74 | Wt 116.2 lb

## 2024-11-28 DIAGNOSIS — K760 Fatty (change of) liver, not elsewhere classified: Secondary | ICD-10-CM | POA: Diagnosis not present

## 2024-11-28 DIAGNOSIS — I5032 Chronic diastolic (congestive) heart failure: Secondary | ICD-10-CM | POA: Diagnosis not present

## 2024-11-28 DIAGNOSIS — R0600 Dyspnea, unspecified: Secondary | ICD-10-CM | POA: Diagnosis not present

## 2024-11-28 DIAGNOSIS — Z7984 Long term (current) use of oral hypoglycemic drugs: Secondary | ICD-10-CM | POA: Diagnosis not present

## 2024-11-28 DIAGNOSIS — Z951 Presence of aortocoronary bypass graft: Secondary | ICD-10-CM | POA: Diagnosis not present

## 2024-11-28 DIAGNOSIS — E785 Hyperlipidemia, unspecified: Secondary | ICD-10-CM | POA: Diagnosis not present

## 2024-11-28 DIAGNOSIS — Z8673 Personal history of transient ischemic attack (TIA), and cerebral infarction without residual deficits: Secondary | ICD-10-CM | POA: Diagnosis not present

## 2024-11-28 DIAGNOSIS — I495 Sick sinus syndrome: Secondary | ICD-10-CM | POA: Diagnosis not present

## 2024-11-28 DIAGNOSIS — I252 Old myocardial infarction: Secondary | ICD-10-CM | POA: Diagnosis not present

## 2024-11-28 DIAGNOSIS — I484 Atypical atrial flutter: Secondary | ICD-10-CM | POA: Diagnosis not present

## 2024-11-28 DIAGNOSIS — Z955 Presence of coronary angioplasty implant and graft: Secondary | ICD-10-CM | POA: Diagnosis not present

## 2024-11-28 DIAGNOSIS — G4733 Obstructive sleep apnea (adult) (pediatric): Secondary | ICD-10-CM | POA: Diagnosis not present

## 2024-11-28 DIAGNOSIS — R0602 Shortness of breath: Secondary | ICD-10-CM | POA: Diagnosis not present

## 2024-11-28 DIAGNOSIS — Z7901 Long term (current) use of anticoagulants: Secondary | ICD-10-CM | POA: Diagnosis not present

## 2024-11-28 DIAGNOSIS — Z87891 Personal history of nicotine dependence: Secondary | ICD-10-CM | POA: Diagnosis not present

## 2024-11-28 DIAGNOSIS — I4821 Permanent atrial fibrillation: Secondary | ICD-10-CM | POA: Diagnosis not present

## 2024-11-28 DIAGNOSIS — Z79899 Other long term (current) drug therapy: Secondary | ICD-10-CM | POA: Diagnosis not present

## 2024-11-28 DIAGNOSIS — N183 Chronic kidney disease, stage 3 unspecified: Secondary | ICD-10-CM | POA: Diagnosis not present

## 2024-11-28 DIAGNOSIS — I2510 Atherosclerotic heart disease of native coronary artery without angina pectoris: Secondary | ICD-10-CM | POA: Diagnosis not present

## 2024-11-28 MED ORDER — EZETIMIBE 10 MG PO TABS: 10.0000 mg | ORAL_TABLET | Freq: Every day | ORAL | 3 refills | Status: AC

## 2024-11-28 NOTE — Patient Instructions (Signed)
 START Zetia  10 mg daily.  Blood work in 2 months.  Your physician recommends that you schedule a follow-up appointment in: 6 months.  If you have any questions or concerns before your next appointment please send us  a message through [place] or call our office at [phone].    TO LEAVE A MESSAGE FOR THE NURSE SELECT OPTION 2, PLEASE LEAVE A MESSAGE INCLUDING: YOUR NAME DATE OF BIRTH CALL BACK NUMBER REASON FOR CALL**this is important as we prioritize the call backs  YOU WILL RECEIVE A CALL BACK THE SAME DAY AS LONG AS YOU CALL BEFORE 4:00 PM  At the [facility], you and your health needs are our priority. As part of our continuing mission to provide you with exceptional heart care, we have created designated Provider Care Teams. These Care Teams include your primary Cardiologist (physician) and Advanced Practice Providers (APPs- Physician Assistants and Nurse Practitioners) who all work together to provide you with the care you need, when you need it.   You may see any of the following providers on your designated Care Team at your next follow up: Dr Toribio Fuel Dr Ezra Shuck Dr. Morene Brownie Greig Mosses, NP Caffie Shed, [place] [facility] [place], [place] Beckey Coe, NP Jordan Lee, NP Ellouise Class, NP Tinnie Redman, PharmD Jaun Bash, PharmD   Please be sure to bring in all your medications bottles to every appointment.    Thank you for choosing [facility]

## 2024-11-28 NOTE — Progress Notes (Signed)
 ID:  [name], DOB [birth year], MRN [id number]  Provider location: [facility] Type of Visit: Established patient  PCP:  Shepard Ade, MD Cardiologist:  Dr. Rolan  Chief complaint: Shortness of breath   History of Present Illness: [name] is a 88 y.o. male who has history of CAD s/p CABG.  Cath in 11/09 showed that the SVG-distal RCA was patent, the CFX system was patent.  His LIMA was atretic and there were serial 60% and 80% stenoses in the native LAD.  He was managed medically.  Echo in 8/14 showed EF 55-60% with moderate MR and moderate TR.    He was initially noted to have atrial fibrillation in the summer of 2014. He was started on Xarelto  and cardioverted to NSR in 8/14.  Recurrent atrial fibrillation was noted in 1/15, and he was cardioverted to NSR again.  This time, NSR did not hold long. By 5/15, he was in persistent atrial fibrillation.  I referred him to [facility] where he had atrial fibrillation ablation and Tikosyn  loading.  Ranolazine  was stopped due to risk of QT prolongation and Imdur  was started as an anti-anginal instead.  Lexiscan  Cardiolite  in 11/15 showed no ischemia.     CTA chest done in 2/16 to look for evidence for PV stenosis post-AF ablation.  This showed mild short-segment narrowing of the left inferior pulmonary vein.    Patient was admitted in 5/18 with acute on chronic diastolic CHF.  He had been at the beach for [place] days and ate out a number of times, probably getting a significant sodium load.  No chest pain.  He was in normal sinus rhythm.  He was started on IV Lasix  and diuresed.  Echo in 5/18 showed EF 65-70% with moderately dilated RV and normal systolic function, PASP 57 mmHg.  Lexiscan  Cardiolite  in 6/18 showed no significant perfusion defect.    He was admitted with NSTEMI in 3/20, had DES to ostial LAD and mid SVG-RCA.  Echo showed EF 45-50%.  He was in atypical atrial flutter persistently despite Tikosyn .   After his intervention, he was noted to have a radial artery pseudoaneurysm that was repaired by Dr. Serene.   In 6/20, he had a redo atrial fibrillation ablation.  He was also noted to have complex flutter from multiple foci that was not ablated.  After the procedure, he has had considerable pain in the right leg at the cath site but also radiating down the leg, groin US  showed no AV fistula or pseudoaneurysm.    In 10/20, patient held anticoagulation for 2 days for oral surgery and had a CVA presenting as right hand weakness.  He did not get tPA.  Echo in 10/20 showed EF 50-55%, mild LVH, moderately decreased RV function, small PFO. Carotid dopplers showed 1-39% bilateral stenosis. He was switched from Xarelto  to Eliquis .   In 11/20, he went into persistent atrial fibrillation and underwent DCCV.  In 7/21, he went into atypical flutter and had DCCV to NSR.  He had a repeat atrial fibrillation ablation in 11/21.   Echo in 1/22 showed EF 60-65%, normal RV, mild-moderate MR. DCCV to NSR in 1/22.   Given significant weight loss, patient had CT chest/abdomen/pelvis in 7/22, this showed no worrisome abnormalities.   Echo in 8/23 showed EF 55-60%, grade 2 diastolic dysfunction, mild RV dilation and mild RV dysfunction, moderate LAE, mild-moderate MR, PASP 41 mmHg.   Patient had DCCV for atypical  atrial flutter in 10/24 but had return of atypical flutter shortly afterwards.   Tikosyn  was stopped and patient was started on amiodarone .  DCCV was attempted in 5/25, he went back into NSR for about 5 days then atypical flutter recurred. Amiodarone  stopped due to futility.   Echo in 7/25 showed EF 55-60%, low normal RV function, mild MR.   He was found to have elevated LFTs, fatty liver noted on imaging.  LFTs normalized after stopping both amiodarone  and statin.   He returns for followup of CHF, atrial fibrillation, and CAD.  He is in atrial fibrillation with controlled rate today.  SBP stable 120s-130s.   Weight is up a few pounds.   He is not taking Lasix .  Dyspnea after walking about 300 feet.  Not getting much exercise.  Legs give out easily and thighs feel weak.  Occasional lightheadedness if he stands too fast.  No orthopnea/PND.  No chest pain.    ECG (personally reviewed): Atrial fibrillation, nonspecific T wave changes.    Labs (11/24): K 4.5, creatinine 1.21, LDL 77 Labs (1/25): LDL 95, LFTs normal, K 4.9, creatinine 1.2, hgb 16.7 Labs (3/25): BNP 179 Labs (5/25): K 5.1, creatinine 1.36 Labs (10/25): LFTs normal Labs (11/25): LDL 69   PMH: 1. CAD: 1st MI in 1988 (while [place] feet on hiking trek at [employer]).  CABG 1996.  PCI to CFX in 2009.  LHC (11/09) SVG-dRCA patent, total occlusion RCA, patent CFX stent, atretic LIMA, serial 60 and 80% proximal LAD stenoses, EF 60% with basal inferior hypokinesis.  Myoview  in 2011 with no ischemia or infarction.  Echo (10/12) with EF 55-60%, mild LVH, mild MR.  Lexiscan  Cardiolite  in 2013 with no ischemia or infarction.  Echo (8/14) with EF 55-60%, moderate MR, moderate TR, PA systolic pressure 35 mmHg.  Echo (4/15) with EF 60-65%, mild focal basal septal hypertrophy, inferior basal akinesis, mild MR.  Lexiscan  cardiolite  (11/15) with EF 61%, fixed basal inferoseptal defect, no ischemia.  - Lexiscan  Cardiolite  (6/18): EF 54%, no perfusion defect.  - NSTEMI 3/20, cath showed 95% ostial/proximal LAD, 60-70% mid LAD, totally occluded SVG-LCx, totally occluded RCA, 80-90% mid SVG-RCA.  Patient had DES to ostial-mid LAD and DES to SVG-RCA.  2. Raynauds syndrome 3. Post-polio syndrome 4. GERD with dilation of esophageal stricture in 12/12.  5. Hyperlipidemia 6. H/o CVAs: Brainstem stroke with Horner's syndrome.  Recurrent stroke in 10/20 with right hand weakness when off anticoagulation for 2 days.  - Carotid dopplers (10/20) with minimal stenosis.  7. Scoliosis. 8. H/o appendectomy 9. Herpes Zoster 10. Atrial fibrillation/atypical  atrial flutter: DCCV to NSR in 8/14. DCCV to NSR in 1/15. Atrial fibrillation ablation 6/15 with Tikosyn  loading (at [facility]).   - Redo atrial fibrillation ablation in 6/20.  Patient also noted to have complex flutter with several foci, not ablated.  - Zio patch (7/20): Primarily NSR with a few short SVT runs.  - DCCV 11/20.  - DCCV 7/21 - Redo atrial fibrillation ablation 11/21.  - DCCV 1/22 - DCCV 10/24 - DCCV 5/25 (on amiodarone ) 11. PFTs (4/15) with FVC 59%, FEV1 54%, ratio 91%, DLCO 53% => moderate obstructive defect thought to be related to COPD and severe restrictive defect thought to be due to elevated left hemidiaphragm and post-polio syndrome.  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  Echo (5/18) with EF 65-70%, RV moderately dilated with normal systolic function, PASP 57 mmHg.  - Echo (3/20): EF 45-50%, mild LV dilation, basal inferolateral and inferior hypokinesis.  -  Echo (10/20): EF 50-55%, mild LVH, basal inferior and inferoseptal hypokinesis, moderately decreased RV systolic function, small PFO.  - Echo (1/22): EF 60-65%, normal RV, mild-moderate MR.  - Echo (8/23): EF 55-60%, grade 2 diastolic dysfunction, mild RV dilation and mild RV dysfunction, moderate LAE, mild-moderate MR, PASP 41 mmHg.  - Echo (7/25): EF 55-60%, low normal RV function, mild MR.  13. CKD: Stage 3.  14. OSA: dental appliance.  15. Right radial artery pseudoaneurysm: post-cath in 3/20, s/p repair.  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 H/o esophageal stricture 18. Elevated LFTs: Fatty liver on abdominal US .  LFT elevation resolved after stopping amiodarone  and statin.   Current Outpatient Medications  Medication Sig Dispense Refill   acetaminophen  (TYLENOL ) 500 MG tablet Take 250-500 mg by mouth as needed for mild pain (pain score 1-3) or moderate pain (pain score 4-6).     albuterol  (PROAIR  HFA) 108 (90 BASE) MCG/ACT inhaler Inhale 2 puffs into the lungs every 4 (four) hours as needed for wheezing or shortness of  breath (and prior to exercise). 1 Inhaler 3   dicyclomine  (BENTYL ) 10 MG capsule TAKE ONE CAPSULE BY MOUTH 3 TIMES DAILY AS NEEDED FOR SPASMS 270 capsule 3   diltiazem  (CARDIZEM  CD) 120 MG 24 hr capsule TAKE 1 CAPSULE BY MOUTH EVERY DAY 30 capsule 3   ELIQUIS  2.5 MG TABS tablet TAKE 1 TABLET BY MOUTH TWICE A DAY 180 tablet 3   empagliflozin  (JARDIANCE ) 10 MG TABS tablet Take Jardiance  5 mg( 1/2 tablet) daily 90 tablet 3   Evolocumab  (REPATHA  SURECLICK) 140 MG/ML SOAJ Inject 140 mg into the skin every 14 (fourteen) days. 6 mL 3   ezetimibe  (ZETIA ) 10 MG tablet Take 1 tablet (10 mg total) by mouth daily. 90 tablet 3   famotidine  (PEPCID ) 40 MG tablet TAKE 1 TABLET BY MOUTH EVERYDAY AT BEDTIME 90 tablet 3   furosemide  (LASIX ) 20 MG tablet Take 1 tablet (20 mg total) by mouth 2 (two) times a week. 30 tablet 4   loperamide (IMODIUM) 2 MG capsule Take 2 mg by mouth daily. My take a second 2 mg dose during the day as needed for loose stools     LORazepam  (ATIVAN ) 0.5 MG tablet Take 0.5 mg by mouth at bedtime.     methocarbamol  (ROBAXIN ) 500 MG tablet Take 500 mg by mouth at bedtime. May take a 500 mg dose twice daily as needed for muscle spasms     metoprolol  succinate (TOPROL  XL) 25 MG 24 hr tablet Take 1 tablet (25 mg total) by mouth daily. 30 tablet 6   nitroGLYCERIN  (NITROSTAT ) 0.4 MG SL tablet Place 1 tablet (0.4 mg total) under the tongue every 5 (five) minutes as needed for chest pain. 25 tablet 0   Probiotic Product (ALIGN) 4 MG CAPS Take 4 mg by mouth every evening.     traZODone  (DESYREL ) 50 MG tablet Take 50 mg by mouth at bedtime.     No current facility-administered medications for this encounter.    Allergies:   Morphine  and codeine and Tape   Social History:  The patient  reports that he quit smoking about 57 years ago. His smoking use included cigarettes. He started smoking about 62 years ago. He has a 2.5 pack-year smoking history. He quit smokeless tobacco use about 75 years ago. He  reports that he does not drink alcohol  and does not use drugs.   Family History:  The patient's family history includes Cancer in his son; Heart Problems in  his brother; Heart disease in his father, maternal grandfather, maternal grandmother, mother, paternal grandfather, and paternal grandmother; Prostate cancer in his paternal uncle.   ROS:  Please see the history of present illness.   All other systems are personally reviewed and negative.   Exam:   BP 130/60   Pulse 74   Wt 52.7 kg (116 lb 3.2 oz)   SpO2 96%   BMI 18.76 kg/m  General: NAD, thin.  Neck: No JVD, no thyromegaly or thyroid  nodule.  Lungs: Clear to auscultation bilaterally with normal respiratory effort. CV: Nondisplaced PMI.  Heart regular S1/S2, no S3/S4, 2/6 early SEM RUSB.  No peripheral edema.  No carotid bruit.  Normal pedal pulses.  Abdomen: Soft, nontender, no hepatosplenomegaly, no distention.  Skin: Intact without lesions or rashes.  Neurologic: Alert and oriented x 3.  Psych: Normal affect. Extremities: No clubbing or cyanosis.  HEENT: Normal.   Recent Labs: 05/16/2024: Hemoglobin 16.1; Platelets 156 06/07/2024: B Natriuretic Peptide 164.1; TSH 2.774 10/18/2024: ALT 30 11/19/2024: BUN 15; Creatinine, Ser 1.15; Potassium 4.5; Sodium 136  Personally reviewed   Wt Readings from Last 3 Encounters:  11/28/24 52.7 kg (116 lb 3.2 oz)  11/13/24 52.3 kg (115 lb 6.4 oz)  09/06/24 50.8 kg (112 lb)   ASSESSMENT AND PLAN:  1. Atrial fibrillation/flutter: He is s/p atrial fibrillation ablation at [facility] on 06/25/14.  He has also had atypical atrial flutter. He had a redo atrial fibrillation ablation in 6/20.  Complex flutter was noted from several foci and was not ablated.  Zio patch from 7/20 showed predominantly NSR with short runs SVT.  DCCV in 11/20, DCCV again in 7/21 and in 1/22. Redo atrial fibrillation ablation in 11/21.  He has significant sinus node disease/sick sinus syndrome and may eventually require  pacemaker.  He failed Tikosyn  and amiodarone  was started.  He had DCCV to NSR on amiodarone  in 5/25 but atypical flutter recurred after about 5 days.  Amiodarone  was stopped due to futility. He is in rate-controlled atrial fibrillation today that appears to be permanent at this point.   - Continue apixaban , this is appropriately dosed at 2.5 mg bid with age and low weight.   - Can continue current diltiazem  CD and Toprol  25 mg daily.  2. CAD: H/o CABG.  He had NSTEMI in 3/20 with DES to ostial/proximal LAD and SVG-RCA.  No chest pain.  - He will continue apixaban  long-term for atrial fibrillation/flutter.  - He is on Repatha .  3. Hyperlipidemia: Crestor  stopped with elevated LFTs, now on Repatha .  Goal LDL < 55, LDL was 69 in 11/25.  - Continue Repatha .  - Add Zetia  10 mg daily with lipids/LFTs in 2 months.  4. HFpEF: Echo in 3/20 with EF 45-50%, echo in 10/20 with EF 50-55%, echo in 1/22 with EF 60-65%.  Echo in 8/23 showed EF 55-60%, grade 2 diastolic dysfunction, mild RV dilation and mild RV dysfunction, moderate LAE, mild-moderate MR, PASP 41 mmHg.  Echo in 7/25 showed EF 55-60%, low normal RV function, mild MR. NYHA class II.  He is not volume overloaded on exam.  - He has not needed Lasix .        - Continue Jardiance  10 mg daily.  Recent BMET stable.  5. Pulmonary: PFTs suggested both a restrictive and obstructive defect.  The restrictive defect is likely from post-polio syndrome and elevated left hemidiaphragm.  Mr Etsitty never smoked much, so the obstructive defect is more surprising.  6. OSA: Severe, likely potentiates  his atrial fibrillation. Unable to tolerate CPAP or oral appliance.   7. CVA: 10/20, in setting of holding anticoagulation for 2 days.  Avoid holding anticoagulation in the future.  Mild residual effect on right hand dexterity.   Followup in 6 months with APP.   I spent 31 minutes reviewing data, interviewing patient, and organizing the orders/followup.   Signed, Ezra Shuck, MD  11/28/2024   [facility] [address] [facility] [place] [address] [phone] (office) [phone] (fax)

## 2024-11-29 ENCOUNTER — Other Ambulatory Visit (HOSPITAL_COMMUNITY): Payer: Self-pay

## 2024-11-29 ENCOUNTER — Telehealth (HOSPITAL_COMMUNITY): Payer: Self-pay

## 2024-11-30 ENCOUNTER — Other Ambulatory Visit (HOSPITAL_COMMUNITY): Payer: Self-pay

## 2024-11-30 NOTE — Telephone Encounter (Signed)
 Advanced [facility] Failure Patient Advocate Encounter  Prior authorization for Repatha  has been submitted and approved. Test billing returns refill too soon rejection; Unable to confirm copay at this time.  [employer] ADAS Effective: 11/29/2024 to 11/29/2025  Rachel DEL, CPhT Rx Patient Advocate Phone: [phone]

## 2024-12-20 ENCOUNTER — Emergency Department (HOSPITAL_COMMUNITY)

## 2024-12-20 ENCOUNTER — Other Ambulatory Visit: Payer: Self-pay

## 2024-12-20 ENCOUNTER — Encounter (HOSPITAL_COMMUNITY): Payer: Self-pay | Admitting: Emergency Medicine

## 2024-12-20 ENCOUNTER — Emergency Department (HOSPITAL_COMMUNITY)
Admission: EM | Admit: 2024-12-20 | Discharge: 2024-12-21 | Disposition: A | Discharge status: Home / Self Care | Attending: Emergency Medicine | Admitting: Emergency Medicine

## 2024-12-20 DIAGNOSIS — Z7901 Long term (current) use of anticoagulants: Secondary | ICD-10-CM | POA: Diagnosis not present

## 2024-12-20 DIAGNOSIS — Z79899 Other long term (current) drug therapy: Secondary | ICD-10-CM | POA: Insufficient documentation

## 2024-12-20 DIAGNOSIS — I2510 Atherosclerotic heart disease of native coronary artery without angina pectoris: Secondary | ICD-10-CM | POA: Insufficient documentation

## 2024-12-20 DIAGNOSIS — J449 Chronic obstructive pulmonary disease, unspecified: Secondary | ICD-10-CM | POA: Insufficient documentation

## 2024-12-20 DIAGNOSIS — I5032 Chronic diastolic (congestive) heart failure: Secondary | ICD-10-CM | POA: Diagnosis not present

## 2024-12-20 DIAGNOSIS — J189 Pneumonia, unspecified organism: Secondary | ICD-10-CM | POA: Insufficient documentation

## 2024-12-20 DIAGNOSIS — I4811 Longstanding persistent atrial fibrillation: Secondary | ICD-10-CM | POA: Insufficient documentation

## 2024-12-20 DIAGNOSIS — R002 Palpitations: Secondary | ICD-10-CM | POA: Diagnosis present

## 2024-12-20 LAB — CBC
HCT: 42.7 % (ref 39.0–52.0)
Hemoglobin: 14 g/dL (ref 13.0–17.0)
MCH: 33.7 pg (ref 26.0–34.0)
MCHC: 32.8 g/dL (ref 30.0–36.0)
MCV: 102.6 fL — ABNORMAL HIGH (ref 80.0–100.0)
Platelets: 170 K/uL (ref 150–400)
RBC: 4.16 MIL/uL — ABNORMAL LOW (ref 4.22–5.81)
RDW: 12.8 % (ref 11.5–15.5)
WBC: 13.4 K/uL — ABNORMAL HIGH (ref 4.0–10.5)
nRBC: 0 % (ref 0.0–0.2)

## 2024-12-20 LAB — BASIC METABOLIC PANEL WITH GFR
Anion gap: 12 (ref 5–15)
BUN: 24 mg/dL — ABNORMAL HIGH (ref 8–23)
CO2: 27 mmol/L (ref 22–32)
Calcium: 10.6 mg/dL — ABNORMAL HIGH (ref 8.9–10.3)
Chloride: 98 mmol/L (ref 98–111)
Creatinine, Ser: 1.31 mg/dL — ABNORMAL HIGH (ref 0.61–1.24)
GFR, Estimated: 53 mL/min — ABNORMAL LOW
Glucose, Bld: 144 mg/dL — ABNORMAL HIGH (ref 70–99)
Potassium: 4.5 mmol/L (ref 3.5–5.1)
Sodium: 137 mmol/L (ref 135–145)

## 2024-12-20 LAB — TROPONIN T, HIGH SENSITIVITY: Troponin T High Sensitivity: 50 ng/L — ABNORMAL HIGH (ref 0–19)

## 2024-12-20 MED ORDER — SODIUM CHLORIDE 0.9 % IV SOLN
500.0000 mg | Freq: Once | INTRAVENOUS | Status: AC
  Administered 2024-12-21: 500 mg via INTRAVENOUS
  Filled 2024-12-20: qty 5

## 2024-12-20 MED ORDER — LACTATED RINGERS IV SOLN: INTRAVENOUS | Status: DC

## 2024-12-20 MED ORDER — SODIUM CHLORIDE 0.9 % IV SOLN
2.0000 g | Freq: Once | INTRAVENOUS | Status: AC
  Administered 2024-12-21: 2 g via INTRAVENOUS
  Filled 2024-12-20: qty 20

## 2024-12-20 NOTE — ED Notes (Signed)
 Pt refused gown.

## 2024-12-20 NOTE — ED Triage Notes (Addendum)
 Patient reports palpitations ( Afib) with emesis and fever this evening . History of MI and CHF.

## 2024-12-21 LAB — URINALYSIS, W/ REFLEX TO CULTURE (INFECTION SUSPECTED)
Bacteria, UA: NONE SEEN
Bilirubin Urine: NEGATIVE
Glucose, UA: >=500 mg/dL — AB
Hgb urine dipstick: NEGATIVE
Ketones, ur: 5 mg/dL — AB
Leukocytes,Ua: NEGATIVE
Nitrite: NEGATIVE
Protein, ur: NEGATIVE mg/dL
Specific Gravity, Urine: 1.018 (ref 1.005–1.030)
pH: 6 (ref 5.0–8.0)

## 2024-12-21 LAB — COMPREHENSIVE METABOLIC PANEL WITH GFR
ALT: 28 U/L (ref 0–44)
AST: 42 U/L — ABNORMAL HIGH (ref 15–41)
Albumin: 4.4 g/dL (ref 3.5–5.0)
Alkaline Phosphatase: 102 U/L (ref 38–126)
Anion gap: 11 (ref 5–15)
BUN: 23 mg/dL (ref 8–23)
CO2: 25 mmol/L (ref 22–32)
Calcium: 10.6 mg/dL — ABNORMAL HIGH (ref 8.9–10.3)
Chloride: 99 mmol/L (ref 98–111)
Creatinine, Ser: 1.25 mg/dL — ABNORMAL HIGH (ref 0.61–1.24)
GFR, Estimated: 56 mL/min — ABNORMAL LOW
Glucose, Bld: 133 mg/dL — ABNORMAL HIGH (ref 70–99)
Potassium: 4.5 mmol/L (ref 3.5–5.1)
Sodium: 136 mmol/L (ref 135–145)
Total Bilirubin: 1 mg/dL (ref 0.0–1.2)
Total Protein: 7.3 g/dL (ref 6.5–8.1)

## 2024-12-21 LAB — RESP PANEL BY RT-PCR (RSV, FLU A&B, COVID)  RVPGX2
Influenza A by PCR: NEGATIVE
Influenza B by PCR: NEGATIVE
Resp Syncytial Virus by PCR: NEGATIVE
SARS Coronavirus 2 by RT PCR: NEGATIVE

## 2024-12-21 LAB — TROPONIN T, HIGH SENSITIVITY: Troponin T High Sensitivity: 51 ng/L — ABNORMAL HIGH (ref 0–19)

## 2024-12-21 LAB — I-STAT CG4 LACTIC ACID, ED: Lactic Acid, Venous: 1.2 mmol/L (ref 0.5–1.9)

## 2024-12-21 LAB — PROTIME-INR
INR: 1.4 — ABNORMAL HIGH (ref 0.8–1.2)
Prothrombin Time: 17.5 s — ABNORMAL HIGH (ref 11.4–15.2)

## 2024-12-21 MED ORDER — ACETAMINOPHEN 500 MG PO TABS
1000.0000 mg | ORAL_TABLET | Freq: Once | ORAL | Status: AC
  Administered 2024-12-21: 1000 mg via ORAL
  Filled 2024-12-21: qty 2

## 2024-12-21 MED ORDER — AZITHROMYCIN 250 MG PO TABS: 250.0000 mg | ORAL_TABLET | Freq: Every day | ORAL | 0 refills | Status: DC

## 2024-12-21 MED ORDER — AMOXICILLIN-POT CLAVULANATE 875-125 MG PO TABS: 1.0000 | ORAL_TABLET | Freq: Two times a day (BID) | ORAL | 0 refills | Status: DC

## 2024-12-21 MED ORDER — AZITHROMYCIN 250 MG PO TABS: 250.0000 mg | ORAL_TABLET | Freq: Every day | ORAL | 0 refills | Status: AC

## 2024-12-21 MED ORDER — AMOXICILLIN-POT CLAVULANATE 875-125 MG PO TABS: 1.0000 | ORAL_TABLET | Freq: Two times a day (BID) | ORAL | 0 refills | Status: AC

## 2024-12-21 NOTE — Sepsis Progress Note (Signed)
 Elink monitoring for the code sepsis protocol.

## 2024-12-21 NOTE — ED Notes (Signed)
 Phlebotomy made aware that blood is needed on patient due to EDP activating code sepsis.

## 2024-12-21 NOTE — ED Provider Notes (Signed)
 " [name] EMERGENCY DEPARTMENT AT [facility] Provider Note   CSN: [id number] Arrival date & time: 12/20/24  2259     Patient presents with: Palpitations ( AFib) / Emesis    [name] is a 88 y.o. male.   HPI     This is an 88 year old male who presents with concern for cough, emesis, chills.  Noted earlier today.  Had a temperature at home per the patient's wife but here for temperature 99.1.  No known sick contacts.  Reports history of uncontrolled atrial fibrillation.  Was noted to have a high rate heart rate and blood pressure at home.  Does not have any chest pain or palpitations.  Reports cough with posttussive emesis.  Cough has been ongoing for the last 24 hours.  No diarrhea.  No abdominal pain or chest pain.  Prior to Admission medications  Medication Sig Start Date End Date Taking? Authorizing Provider  amoxicillin -clavulanate (AUGMENTIN ) 875-125 MG tablet Take 1 tablet by mouth every 12 (twelve) hours. 12/21/24  Yes Shimshon Narula, Charmaine FALCON, MD  azithromycin  (ZITHROMAX ) 250 MG tablet Take 1 tablet (250 mg total) by mouth daily. Take first 2 tablets together, then 1 every day until finished. 12/21/24  Yes Leanord Thibeau, Charmaine FALCON, MD  acetaminophen  (TYLENOL ) 500 MG tablet Take 250-500 mg by mouth as needed for mild pain (pain score 1-3) or moderate pain (pain score 4-6).    [provider]  albuterol  (PROAIR  HFA) 108 (90 BASE) MCG/ACT inhaler Inhale 2 puffs into the lungs every 4 (four) hours as needed for wheezing or shortness of breath (and prior to exercise). 06/13/14   Shelah Lamar RAMAN, MD  dicyclomine  (BENTYL ) 10 MG capsule TAKE ONE CAPSULE BY MOUTH 3 TIMES DAILY AS NEEDED FOR SPASMS 05/04/24   May, Deanna J, NP  diltiazem  (CARDIZEM  CD) 120 MG 24 hr capsule TAKE 1 CAPSULE BY MOUTH EVERY DAY 09/03/24   Terra Fairy PARAS, PA-C  ELIQUIS  2.5 MG TABS tablet TAKE 1 TABLET BY MOUTH TWICE A DAY 06/18/24   Rolan Ezra RAMAN, MD  empagliflozin  (JARDIANCE ) 10 MG TABS tablet Take  Jardiance  5 mg( 1/2 tablet) daily 08/09/24   Milford, Harlene HERO, FNP  Evolocumab  (REPATHA  SURECLICK) 140 MG/ML SOAJ Inject 140 mg into the skin every 14 (fourteen) days. 06/08/24   Rolan Ezra RAMAN, MD  ezetimibe  (ZETIA ) 10 MG tablet Take 1 tablet (10 mg total) by mouth daily. 11/28/24 02/26/25  Rolan Ezra RAMAN, MD  famotidine  (PEPCID ) 40 MG tablet TAKE 1 TABLET BY MOUTH EVERYDAY AT BEDTIME 05/09/24   May, Deanna J, NP  furosemide  (LASIX ) 20 MG tablet Take 1 tablet (20 mg total) by mouth 2 (two) times a week. 03/12/24   Milford, Harlene HERO, FNP  loperamide (IMODIUM) 2 MG capsule Take 2 mg by mouth daily. My take a second 2 mg dose during the day as needed for loose stools    [provider]  LORazepam  (ATIVAN ) 0.5 MG tablet Take 0.5 mg by mouth at bedtime.    [provider]  methocarbamol  (ROBAXIN ) 500 MG tablet Take 500 mg by mouth at bedtime. May take a 500 mg dose twice daily as needed for muscle spasms    [provider]  metoprolol  succinate (TOPROL  XL) 25 MG 24 hr tablet Take 1 tablet (25 mg total) by mouth daily. 06/07/24   Rolan Ezra RAMAN, MD  nitroGLYCERIN  (NITROSTAT ) 0.4 MG SL tablet Place 1 tablet (0.4 mg total) under the tongue every 5 (five) minutes as  needed for chest pain. 01/21/23 02/12/25  Rolan Ezra RAMAN, MD  Probiotic Product (ALIGN) 4 MG CAPS Take 4 mg by mouth every evening.    [provider]  traZODone  (DESYREL ) 50 MG tablet Take 50 mg by mouth at bedtime.    [provider]    Allergies: Morphine  and codeine and Tape    Review of Systems  Constitutional:  Positive for chills and fever.  Respiratory:  Positive for cough. Negative for shortness of breath.   Cardiovascular:  Negative for chest pain.  All other systems reviewed and are negative.   Updated Vital Signs BP (!) 97/54   Pulse 81   Temp 99.1 F (37.3 C)   Resp (!) 23   SpO2 94%   Physical Exam Vitals and nursing note reviewed.  Constitutional:      Appearance: He  is well-developed. He is not ill-appearing.  HENT:     Head: Normocephalic and atraumatic.  Eyes:     Pupils: Pupils are equal, round, and reactive to light.  Cardiovascular:     Rate and Rhythm: Regular rhythm. Tachycardia present.     Heart sounds: Normal heart sounds. No murmur heard. Pulmonary:     Effort: Pulmonary effort is normal. No respiratory distress.     Breath sounds: No wheezing.     Comments: Coarse breath sounds right lower lobe Abdominal:     General: Bowel sounds are normal.     Palpations: Abdomen is soft.     Tenderness: There is no abdominal tenderness. There is no rebound.  Musculoskeletal:     Cervical back: Neck supple.  Lymphadenopathy:     Cervical: No cervical adenopathy.  Skin:    General: Skin is warm and dry.  Neurological:     Mental Status: He is alert and oriented to person, place, and time.  Psychiatric:        Mood and Affect: Mood normal.     (all labs ordered are listed, but only abnormal results are displayed) Labs Reviewed  BASIC METABOLIC PANEL WITH GFR - Abnormal; Notable for the following components:      Result Value   Glucose, Bld 144 (*)    BUN 24 (*)    Creatinine, Ser 1.31 (*)    Calcium  10.6 (*)    GFR, Estimated 53 (*)    All other components within normal limits  CBC - Abnormal; Notable for the following components:   WBC 13.4 (*)    RBC 4.16 (*)    MCV 102.6 (*)    All other components within normal limits  COMPREHENSIVE METABOLIC PANEL WITH GFR - Abnormal; Notable for the following components:   Glucose, Bld 133 (*)    Creatinine, Ser 1.25 (*)    Calcium  10.6 (*)    AST 42 (*)    GFR, Estimated 56 (*)    All other components within normal limits  PROTIME-INR - Abnormal; Notable for the following components:   Prothrombin Time 17.5 (*)    INR 1.4 (*)    All other components within normal limits  URINALYSIS, W/ REFLEX TO CULTURE (INFECTION SUSPECTED) - Abnormal; Notable for the following components:   Glucose,  UA >=500 (*)    Ketones, ur 5 (*)    All other components within normal limits  TROPONIN T, HIGH SENSITIVITY - Abnormal; Notable for the following components:   Troponin T High Sensitivity 50 (*)    All other components within normal limits  TROPONIN T, HIGH SENSITIVITY -  Abnormal; Notable for the following components:   Troponin T High Sensitivity 51 (*)    All other components within normal limits  RESP PANEL BY RT-PCR (RSV, FLU A&B, COVID)  RVPGX2  CULTURE, BLOOD (ROUTINE X 2)  CULTURE, BLOOD (ROUTINE X 2)  I-STAT CG4 LACTIC ACID, ED    EKG: EKG Interpretation Date/Time:  Thursday December 20 2024 23:16:10 EST Ventricular Rate:  136 PR Interval:    QRS Duration:  102 QT Interval:  298 QTC Calculation: 448 R Axis:   72  Text Interpretation: Atrial fibrillation with frequent Premature ventricular complexes Marked ST abnormality, possible inferior subendocardial injury Abnormal ECG When compared with ECG of 28-Nov-2024 09:56, PREVIOUS ECG IS PRESENT Confirmed by Bari Pfeiffer ([id number]) on 12/20/2024 11:20:34 PM  Radiology: [facility] Chest 2 View Result Date: 12/20/2024 EXAM: 2 VIEW(S) XRAY OF THE CHEST 12/20/2024 11:29:00 PM COMPARISON: 3 / 22 / 22 CLINICAL HISTORY: cp FINDINGS: LUNGS AND PLEURA: Elevated left hemidiaphragm. Biapical pleural thickening, right greater than left. No focal pulmonary opacity. No pleural effusion. No pneumothorax. HEART AND MEDIASTINUM: Sternotomy wires noted. Fractured superior sternotomy wire. CABG markers. Aortic atherosclerosis. BONES AND SOFT TISSUES: Sternotomy wires noted. Fractured superior sternotomy wire. IMPRESSION: 1. No acute cardiopulmonary abnormality. Electronically signed by: Norman Gatlin MD 12/20/2024 11:38 PM EST RP Workstation: [id number]     .Critical Care  Performed by: Bari Pfeiffer FALCON, MD Authorized by: Bari Pfeiffer FALCON, MD   Critical care provider statement:    Critical care time (minutes):  31   Critical care was necessary  to treat or prevent imminent or life-threatening deterioration of the following conditions:  Sepsis (Sepsis alert, IV antibiotics)   Critical care was time spent personally by me on the following activities:  Development of treatment plan with patient or surrogate, discussions with consultants, evaluation of patient's response to treatment, examination of patient, ordering and review of laboratory studies, ordering and review of radiographic studies, ordering and performing treatments and interventions, pulse oximetry, re-evaluation of patient's condition and review of old charts    Medications Ordered in the ED  lactated ringers  infusion ( Intravenous New Bag/Given 12/21/24 0059)  cefTRIAXone  (ROCEPHIN ) 2 g in sodium chloride  0.9 % 100 mL IVPB (0 g Intravenous Stopped 12/21/24 0049)  azithromycin  (ZITHROMAX ) 500 mg in sodium chloride  0.9 % 250 mL IVPB (500 mg Intravenous New Bag/Given 12/21/24 0102)  acetaminophen  (TYLENOL ) tablet 1,000 mg (1,000 mg Oral Given 12/21/24 0056)                                    Medical Decision Making Amount and/or Complexity of Data Reviewed Labs: ordered. Radiology: ordered.  Risk OTC drugs. Prescription drug management.   This patient presents to the ED for concern of respiratory symptoms, cough, this involves an extensive number of treatment options, and is a complaint that carries with it a high risk of complications and morbidity.  I considered the following differential and admission for this acute, potentially life threatening condition.  The differential diagnosis includes viral illness such as COVID or influenza, pneumonia, heart failure, A-fib with RVR  MDM:    This is an 88 year old male with history of persistent atrial fibrillation who presents with chills, cough, emesis.  Reports fever at home.  Overall nontoxic but is in atrial fibrillation with RVR.  Wife reports that this is persistent for him.  Because of presentation, sepsis workup was  initiated.  EKG shows  no evidence of acute ischemia.  Troponin x 2 flat at 50 and 51.  COVID and influenza are negative.  Does have a leukocytosis to 13.6.  Chest x-ray does not show an obvious pneumonia although could lag behind the clinical picture.  Given clinical picture would elect to treat for community-acquired pneumonia.  Patient given antibiotics.  Lactate is normal.  Patient was given Tylenol .  Atrial fibrillation settled into the 80s without acute intervention.  On recheck he is resting comfortably.  He is in rate controlled atrial fibrillation.  Discussed my concerns for early pneumonia given symptoms and presentation.  Will treat with antibiotics.  (Labs, imaging, consults)  Labs: I Ordered, and personally interpreted labs.  The pertinent results include: Sepsis labs  Imaging Studies ordered: I ordered imaging studies including chest x-ray I independently visualized and interpreted imaging. I agree with the radiologist interpretation  Additional history obtained from chart review.  External records from outside source obtained and reviewed including prior evaluations  Cardiac Monitoring: The patient was maintained on a cardiac monitor.  If on the cardiac monitor, I personally viewed and interpreted the cardiac monitored which showed an underlying rhythm of atrial fibrillation  Reevaluation: After the interventions noted above, I reevaluated the patient and found that they have :improved  Social Determinants of Health:  lives independently  Disposition: Discharge  Co morbidities that complicate the patient evaluation  Past Medical History:  Diagnosis Date   Abnormal nuclear cardiac imaging test March 2011   Has positive EKG response, no perfusion defect and normal EF   Adenomatous colon polyp 12/2002   Arrhythmia    Atrial fibrillation (HCC)    a. s/p rfca;  b. chronic tikosyn  and xarelto .   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 04/2017 Echo: EF 65-70%, restrictive physiology, mildly dil LA.   COPD (chronic obstructive pulmonary disease) (HCC)    Coronary artery disease    First MI in 1988. PCI in 1996 with subsequent CABG in 1996 due to restenosis. S/P stents to LCX in 2009. Noted to have residual disease in the LAD in a diffuse manner and atretic LIMA graft. He is managed medically.    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   History of Raynaud's syndrome    Hyperlipidemia    Hypertension    Internal hemorrhoids    Muscle spasm    Myocardial infarction (HCC)    MI x1 1989 - 44 age   Neuromuscular disorder (HCC)    Polio - age 56   OSA (obstructive sleep apnea) 10/16/2018   Severe OSA with AHI 37.2/hr on BiPAP   Persistent atrial fibrillation (HCC)    Polio    age 5   PVC (premature ventricular contraction)    Scoliosis    mild     Medicines Meds ordered this encounter  Medications   lactated ringers  infusion   cefTRIAXone  (ROCEPHIN ) 2 g in sodium chloride  0.9 % 100 mL IVPB    Antibiotic Indication::   CAP   azithromycin  (ZITHROMAX ) 500 mg in sodium chloride  0.9 % 250 mL IVPB  Antibiotic Indication::   CAP   acetaminophen  (TYLENOL ) tablet 1,000 mg   azithromycin  (ZITHROMAX ) 250 MG tablet    Sig: Take 1 tablet (250 mg total) by mouth daily. Take first 2 tablets together, then 1 every day until finished.    Dispense:  6 tablet    Refill:  0   amoxicillin -clavulanate (AUGMENTIN ) 875-125 MG tablet    Sig: Take 1 tablet by mouth every 12 (twelve) hours.    Dispense:  14 tablet    Refill:  0    I have reviewed the patients home medicines and have made  adjustments as needed  Problem List / ED Course: Problem List Items Addressed This Visit       Cardiovascular and Mediastinum   Atrial fibrillation (HCC)   Other Visit Diagnoses       Community acquired pneumonia, unspecified laterality    -  Primary   Relevant Medications   cefTRIAXone  (ROCEPHIN ) 2 g in sodium chloride  0.9 % 100 mL IVPB (Completed)   azithromycin  (ZITHROMAX ) 500 mg in sodium chloride  0.9 % 250 mL IVPB (Completed)   azithromycin  (ZITHROMAX ) 250 MG tablet   amoxicillin -clavulanate (AUGMENTIN ) 875-125 MG tablet                Final diagnoses:  Community acquired pneumonia, unspecified laterality  Longstanding persistent atrial fibrillation [facility])    ED Discharge Orders          Ordered    azithromycin  (ZITHROMAX ) 250 MG tablet  Daily        12/21/24 0235    amoxicillin -clavulanate (AUGMENTIN ) 875-125 MG tablet  Every 12 hours        12/21/24 0235               Bari Charmaine FALCON, MD 12/21/24 0240  "

## 2024-12-21 NOTE — Discharge Instructions (Signed)
 You were seen today for chills, cough.  You were noted to be in atrial fibrillation.  You have clinical evidence of pneumonia although your chest x-ray at this time does not show pneumonia.  Will treat with antibiotics.  Continue your other ongoing medications and follow-up closely with your primary doctor.

## 2024-12-26 LAB — CULTURE, BLOOD (ROUTINE X 2)
Culture: NO GROWTH
Culture: NO GROWTH
Special Requests: ADEQUATE
Special Requests: ADEQUATE

## 2025-01-04 ENCOUNTER — Other Ambulatory Visit (HOSPITAL_COMMUNITY): Payer: Self-pay | Admitting: Cardiology

## 2025-01-04 MED ORDER — DILTIAZEM HCL ER COATED BEADS 120 MG PO CP24: 120.0000 mg | ORAL_CAPSULE | Freq: Every day | ORAL | 3 refills | Status: AC

## 2025-01-29 ENCOUNTER — Ambulatory Visit (HOSPITAL_COMMUNITY)

## 2025-02-04 ENCOUNTER — Ambulatory Visit (HOSPITAL_COMMUNITY)

## 2025-05-29 ENCOUNTER — Ambulatory Visit (HOSPITAL_COMMUNITY)
